# Patient Record
Sex: Female | Born: 1935 | Race: White | Hispanic: No | Smoking: Never smoker
Health system: Southern US, Community
[De-identification: ages and names within clinical notes are randomized; demographics above are authoritative.]

## PROBLEM LIST (undated history)

## (undated) DIAGNOSIS — N950 Postmenopausal bleeding: Secondary | ICD-10-CM

## (undated) DIAGNOSIS — F419 Anxiety disorder, unspecified: Secondary | ICD-10-CM

## (undated) DIAGNOSIS — R0600 Dyspnea, unspecified: Secondary | ICD-10-CM

## (undated) DIAGNOSIS — I4719 Other supraventricular tachycardia: Secondary | ICD-10-CM

## (undated) DIAGNOSIS — C541 Malignant neoplasm of endometrium: Secondary | ICD-10-CM

## (undated) DIAGNOSIS — R110 Nausea: Secondary | ICD-10-CM

## (undated) DIAGNOSIS — I509 Heart failure, unspecified: Secondary | ICD-10-CM

## (undated) DIAGNOSIS — G8929 Other chronic pain: Secondary | ICD-10-CM

## (undated) DIAGNOSIS — R079 Chest pain, unspecified: Secondary | ICD-10-CM

## (undated) DIAGNOSIS — I471 Supraventricular tachycardia: Secondary | ICD-10-CM

## (undated) DIAGNOSIS — K219 Gastro-esophageal reflux disease without esophagitis: Secondary | ICD-10-CM

## (undated) DIAGNOSIS — R200 Anesthesia of skin: Secondary | ICD-10-CM

## (undated) DIAGNOSIS — L97509 Non-pressure chronic ulcer of other part of unspecified foot with unspecified severity: Secondary | ICD-10-CM

## (undated) DIAGNOSIS — I4892 Unspecified atrial flutter: Secondary | ICD-10-CM

## (undated) DIAGNOSIS — K2970 Gastritis, unspecified, without bleeding: Secondary | ICD-10-CM

## (undated) DIAGNOSIS — K851 Biliary acute pancreatitis without necrosis or infection: Secondary | ICD-10-CM

## (undated) DIAGNOSIS — M545 Low back pain, unspecified: Secondary | ICD-10-CM

## (undated) DIAGNOSIS — I4891 Unspecified atrial fibrillation: Secondary | ICD-10-CM

## (undated) DIAGNOSIS — E039 Hypothyroidism, unspecified: Secondary | ICD-10-CM

## (undated) DIAGNOSIS — E785 Hyperlipidemia, unspecified: Secondary | ICD-10-CM

## (undated) DIAGNOSIS — E669 Obesity, unspecified: Secondary | ICD-10-CM

## (undated) DIAGNOSIS — Z8719 Personal history of other diseases of the digestive system: Secondary | ICD-10-CM

## (undated) DIAGNOSIS — Z9289 Personal history of other medical treatment: Secondary | ICD-10-CM

## (undated) DIAGNOSIS — D649 Anemia, unspecified: Secondary | ICD-10-CM

## (undated) DIAGNOSIS — R519 Headache, unspecified: Secondary | ICD-10-CM

## (undated) DIAGNOSIS — I10 Essential (primary) hypertension: Secondary | ICD-10-CM

## (undated) DIAGNOSIS — M1990 Unspecified osteoarthritis, unspecified site: Secondary | ICD-10-CM

## (undated) DIAGNOSIS — R42 Dizziness and giddiness: Secondary | ICD-10-CM

## (undated) DIAGNOSIS — R51 Headache: Secondary | ICD-10-CM

## (undated) HISTORY — PX: LUMBAR SPINE SURGERY: SHX701

## (undated) HISTORY — DX: Essential (primary) hypertension: I10

## (undated) HISTORY — DX: Other supraventricular tachycardia: I47.19

## (undated) HISTORY — PX: SHOULDER SURGERY: SHX246

## (undated) HISTORY — DX: Gastritis, unspecified, without bleeding: K29.70

## (undated) HISTORY — DX: Hypothyroidism, unspecified: E03.9

## (undated) HISTORY — DX: Unspecified atrial fibrillation: I48.91

## (undated) HISTORY — DX: Dizziness and giddiness: R42

## (undated) HISTORY — DX: Supraventricular tachycardia: I47.1

## (undated) HISTORY — DX: Gastro-esophageal reflux disease without esophagitis: K21.9

## (undated) HISTORY — DX: Obesity, unspecified: E66.9

## (undated) HISTORY — PX: BACK SURGERY: SHX140

## (undated) HISTORY — DX: Unspecified atrial flutter: I48.92

## (undated) HISTORY — DX: Hyperlipidemia, unspecified: E78.5

## (undated) HISTORY — PX: FRACTURE SURGERY: SHX138

---

## 1981-06-14 HISTORY — PX: WRIST FRACTURE SURGERY: SHX121

## 1997-10-29 ENCOUNTER — Ambulatory Visit (HOSPITAL_COMMUNITY): Admission: RE | Admit: 1997-10-29 | Discharge: 1997-10-29 | Payer: Self-pay | Admitting: Family Medicine

## 1997-11-15 ENCOUNTER — Inpatient Hospital Stay (HOSPITAL_COMMUNITY): Admission: RE | Admit: 1997-11-15 | Discharge: 1997-11-20 | Payer: Self-pay | Admitting: Neurosurgery

## 1998-04-10 ENCOUNTER — Ambulatory Visit (HOSPITAL_COMMUNITY): Admission: RE | Admit: 1998-04-10 | Discharge: 1998-04-10 | Payer: Self-pay | Admitting: Neurosurgery

## 1998-04-10 ENCOUNTER — Encounter: Payer: Self-pay | Admitting: Neurosurgery

## 2001-09-25 ENCOUNTER — Encounter: Admission: RE | Admit: 2001-09-25 | Discharge: 2001-12-24 | Payer: Self-pay | Admitting: Family Medicine

## 2010-11-16 ENCOUNTER — Inpatient Hospital Stay (HOSPITAL_COMMUNITY)
Admission: EM | Admit: 2010-11-16 | Discharge: 2010-11-18 | DRG: 251 | Disposition: A | Discharge status: Home / Self Care | Payer: Medicare Other | Attending: Interventional Cardiology | Admitting: Interventional Cardiology

## 2010-11-16 ENCOUNTER — Emergency Department (HOSPITAL_COMMUNITY): Payer: Medicare Other

## 2010-11-16 DIAGNOSIS — R55 Syncope and collapse: Secondary | ICD-10-CM | POA: Diagnosis present

## 2010-11-16 DIAGNOSIS — I498 Other specified cardiac arrhythmias: Principal | ICD-10-CM | POA: Diagnosis present

## 2010-11-16 DIAGNOSIS — E039 Hypothyroidism, unspecified: Secondary | ICD-10-CM | POA: Diagnosis present

## 2010-11-16 DIAGNOSIS — I10 Essential (primary) hypertension: Secondary | ICD-10-CM | POA: Diagnosis present

## 2010-11-16 DIAGNOSIS — E785 Hyperlipidemia, unspecified: Secondary | ICD-10-CM | POA: Diagnosis present

## 2010-11-16 LAB — CBC
HCT: 36.9 % (ref 36.0–46.0)
Hemoglobin: 13.5 g/dL (ref 12.0–15.0)
MCH: 31.7 pg (ref 26.0–34.0)
MCHC: 36.6 g/dL — ABNORMAL HIGH (ref 30.0–36.0)
MCV: 86.6 fL (ref 78.0–100.0)
Platelets: 136 10*3/uL — ABNORMAL LOW (ref 150–400)
RBC: 4.26 MIL/uL (ref 3.87–5.11)
RDW: 12.9 % (ref 11.5–15.5)
WBC: 9.4 10*3/uL (ref 4.0–10.5)

## 2010-11-16 LAB — CK TOTAL AND CKMB (NOT AT ARMC)
CK, MB: 1.1 ng/mL (ref 0.3–4.0)
Relative Index: INVALID (ref 0.0–2.5)
Total CK: 59 U/L (ref 7–177)

## 2010-11-16 LAB — DIFFERENTIAL
Basophils Absolute: 0.1 10*3/uL (ref 0.0–0.1)
Basophils Relative: 1 % (ref 0–1)
Eosinophils Absolute: 0.1 10*3/uL (ref 0.0–0.7)
Eosinophils Relative: 1 % (ref 0–5)
Lymphocytes Relative: 24 % (ref 12–46)
Lymphs Abs: 2.3 10*3/uL (ref 0.7–4.0)
Monocytes Absolute: 1 10*3/uL (ref 0.1–1.0)
Monocytes Relative: 11 % (ref 3–12)
Neutro Abs: 5.9 10*3/uL (ref 1.7–7.7)
Neutrophils Relative %: 63 % (ref 43–77)
Smear Review: ADEQUATE

## 2010-11-16 LAB — BASIC METABOLIC PANEL
BUN: 27 mg/dL — ABNORMAL HIGH (ref 6–23)
CO2: 21 mEq/L (ref 19–32)
Chloride: 100 mEq/L (ref 96–112)
Creatinine, Ser: 1.29 mg/dL — ABNORMAL HIGH (ref 0.4–1.2)
Glucose, Bld: 113 mg/dL — ABNORMAL HIGH (ref 70–99)

## 2010-11-16 LAB — TROPONIN I: Troponin I: <0.3 ng/mL (ref <0.30)

## 2010-11-16 LAB — URINALYSIS, ROUTINE W REFLEX MICROSCOPIC
Bilirubin Urine: NEGATIVE
Glucose, UA: NEGATIVE mg/dL
Hgb urine dipstick: NEGATIVE
Ketones, ur: NEGATIVE mg/dL
Protein, ur: NEGATIVE mg/dL

## 2010-11-16 LAB — T4, FREE: Free T4: 1.33 ng/dL (ref 0.80–1.80)

## 2010-11-16 LAB — PROTIME-INR
INR: 1.2 (ref 0.00–1.49)
Prothrombin Time: 15.4 seconds — ABNORMAL HIGH (ref 11.6–15.2)

## 2010-11-16 LAB — TSH: TSH: 1.277 u[IU]/mL (ref 0.350–4.500)

## 2010-11-16 LAB — HEPATIC FUNCTION PANEL
Albumin: 2.9 g/dL — ABNORMAL LOW (ref 3.5–5.2)
Indirect Bilirubin: 0.5 mg/dL (ref 0.3–0.9)
Total Protein: 6.5 g/dL (ref 6.0–8.3)

## 2010-11-16 IMAGING — CR DG CHEST 1V PORT
1 series · 1 of 1 positions shown · non-contrast
Comparison: None.

CLINICAL DATA: Palpitations with high blood pressure.

PORTABLE CHEST - 1 VIEW

[view not recorded]
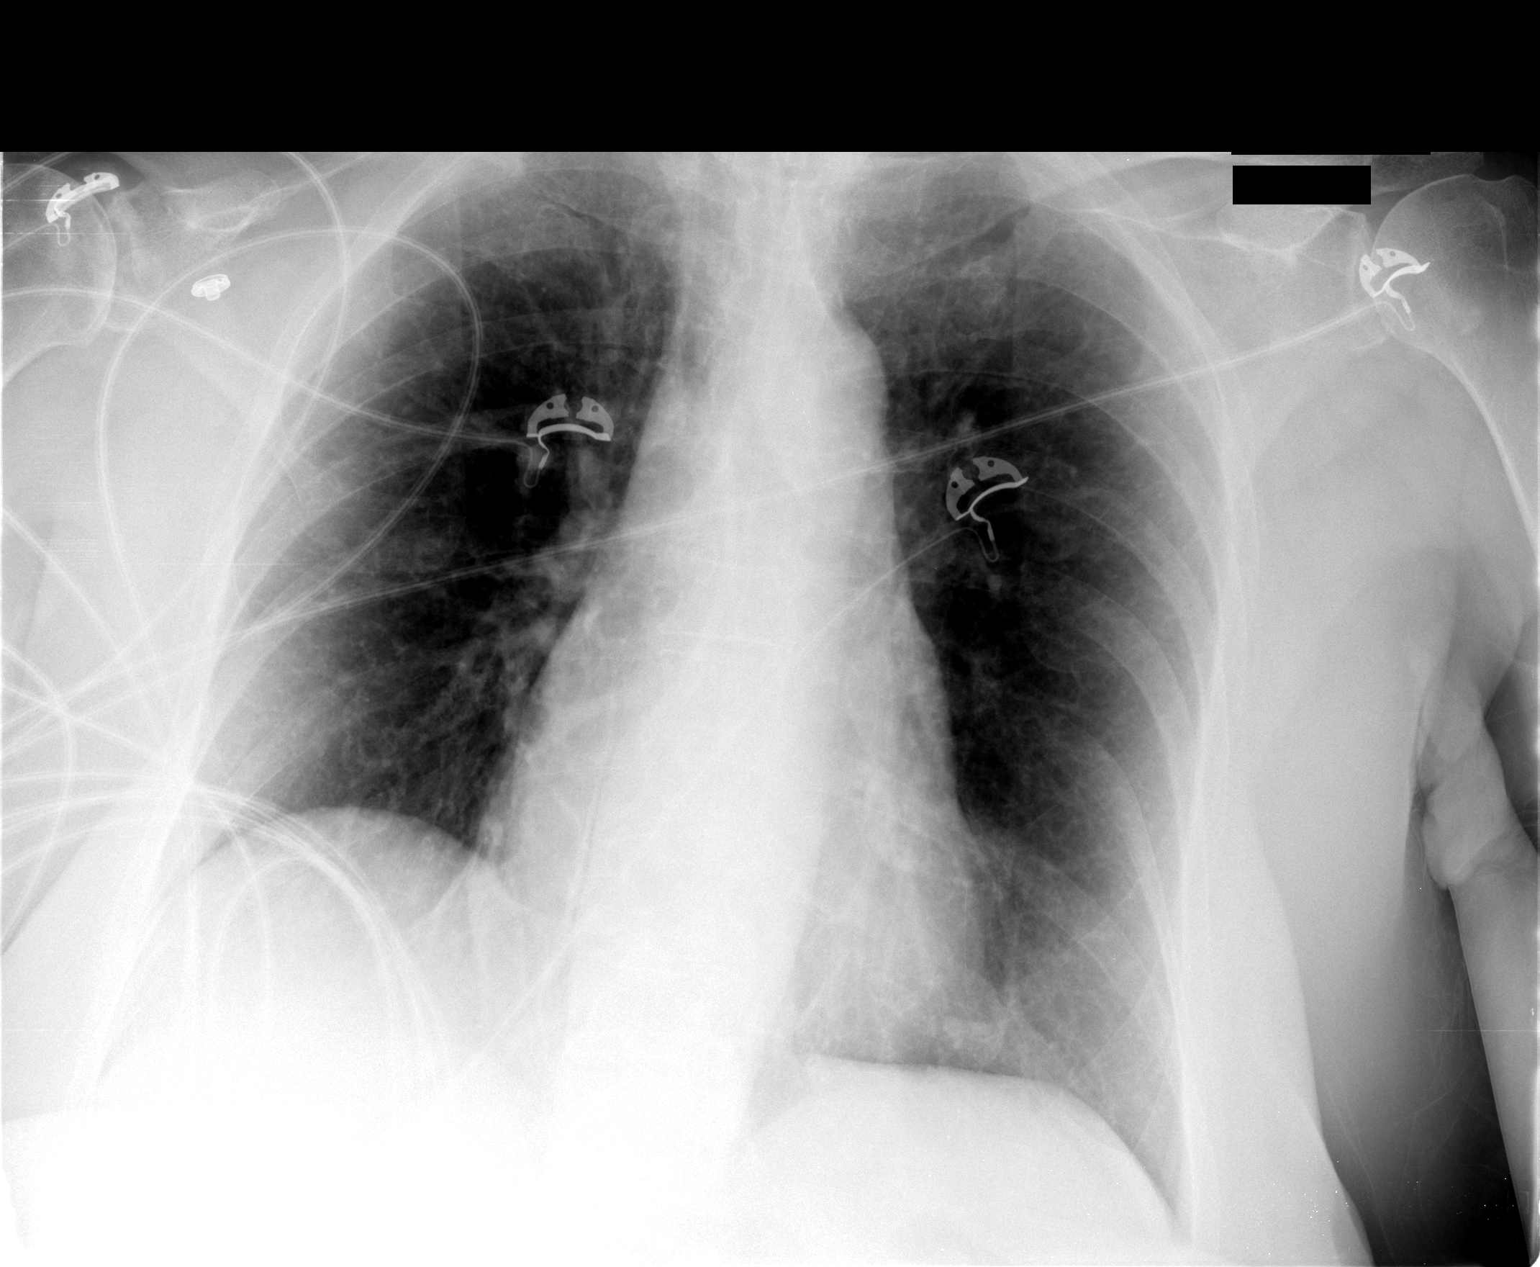

[1 of 1 positions shown; findings below may reference images not displayed]

FINDINGS: [2T] hours. Hyperexpansion is consistent with emphysema.
The lungs are clear without focal infiltrate, edema, pneumothorax
or pleural effusion. The cardiopericardial silhouette is enlarged.
Telemetry leads overlie the chest.
IMPRESSION: Emphysema without acute cardiopulmonary process.

## 2010-11-17 DIAGNOSIS — I471 Supraventricular tachycardia: Secondary | ICD-10-CM

## 2010-11-17 HISTORY — PX: ATRIAL ABLATION SURGERY: SHX560

## 2010-11-17 LAB — CBC
Hemoglobin: 12.3 g/dL (ref 12.0–15.0)
MCHC: 35.7 g/dL (ref 30.0–36.0)
Platelets: 125 10*3/uL — ABNORMAL LOW (ref 150–400)
RBC: 3.95 MIL/uL (ref 3.87–5.11)

## 2010-11-17 LAB — BASIC METABOLIC PANEL
CO2: 22 mEq/L (ref 19–32)
Calcium: 8 mg/dL — ABNORMAL LOW (ref 8.4–10.5)
Chloride: 107 mEq/L (ref 96–112)
GFR calc Af Amer: >60 mL/min (ref >60)
Sodium: 137 mEq/L (ref 135–145)

## 2010-11-17 LAB — URINE CULTURE: Culture  Setup Time: 201206041330

## 2010-11-17 NOTE — H&P (Signed)
Charlotte Castro, TERRIS NO.:  [id number]  MEDICAL RECORD NO.:  [id number]  LOCATION:  4708                         FACILITY:  [facility]  PHYSICIAN:  Jake Bathe, MD      DATE OF BIRTH:  January 20, 1933  DATE OF ADMISSION:  11/16/2010 DATE OF DISCHARGE:                             HISTORY & PHYSICAL   PRIMARY CARE PHYSICIAN:  Cain Saupe, MD  CARDIOLOGIST:  Corky Crafts, MD  CHIEF COMPLAINT:  Dizziness, near syncope, palpitations, feeling weak.  HISTORY OF PRESENT ILLNESS:  A 77 year old female with previous palpitations, who has been on atenolol for the past 4 years, who has also experienced orthostatic hypotension who over the past week has been experiencing increased weakness, fatigue, and near syncope, especially when standing or getting up from a seated position.  Occasionally, she has felt palpitations and her heart racing and while she was at Dr. Recardo Evangelist office today while feeling poorly, he documented a supraventricular tachycardia at 130-140 beats per minute.  She came to the emergency department for further evaluation where she was then found in sinus rhythm with a heart rate of 64 and her resting EKG appeared to have a slight delta wave configuration in lead II and III as well as aVF.  Lab work was done and was unremarkable except for creatinine of 1.29, BUN of 27, and platelet count of 136 and while she was about to be discharged, she ended up once again proceeding to this tachycardia. This was caught on telemetry and appears to be a long RP tachycardia with a P-wave proceeding each QRS complex.  These are abrupt onset and off, do not appeared to be atrial flutter underlying.  I have shown strips to Dr. Ladona Ridgel of Electrophysiology who will perform a consultation.  She denies any recent fevers, emesis, diarrhea.  She has had chills, occasionally over the past week.  Her symptoms of feeling faint have been present for several years and  palpitations also for several years. She saw Dr. Eldridge Dace back in 2009, when he performed a nuclear stress test which was low risk.  EF was normal.  PAST MEDICAL HISTORY:  She denies any coronary artery disease, myocardial infarction, or diabetes.  She does have hypothyroidism, hypertension, hyperlipidemia, obesity, osteoporosis, history of gastritis, and vitamin D deficiency.  FAMILY HISTORY:  Both her mother and father had hypertension, but no early family history of coronary artery disease.  SOCIAL HISTORY:  Denies any tobacco or alcohol use.  She is divorced, she has 2 children, both of which were at her bedside.  She currently lives alone.  She is not allergic to any medicines.  MEDICATION LIST: 1. Centrum. 2. Aspirin 81 mg a day. 3. Detrol 4 mg a day p.r.n. 4. Calcium and vitamin D. 5. Lisinopril 20 mg a day. 6. Fosamax 70 mg once a week. 7. Vitamin D. 8. Pravastatin 20 mg a day. 9. Levothyroxine 75 mcg a day. 10.Atenolol 75 mg a day. 11.Hydrochlorothiazide 25 mg once a day.  Medicine reconciliation, this has been provided by pharmacy.  REVIEW OF SYSTEMS:  Unless explained above, all other 12 review of systems is negative.  She has not had  any frank syncope.  PHYSICAL EXAMINATION:  VITAL SIGNS:  Blood pressure was 100/60, pulse was 130-140, satting 92% on room air, afebrile. GENERAL:  Alert and oriented x3, in no acute distress, elderly-appearing overweight female in bed comfortable with her children at bedside. HEENT:  Eyes, well-perfused conjunctivae.  EOMI.  No scleral icterus. NECK:  Supple.  No lymphadenopathy.  No bruits heard. CARDIOVASCULAR:  Regular rate and rhythm without any appreciable murmurs, rubs, or gallops.  Difficult to palpate PMI. LUNGS:  Clear to auscultation bilaterally.  Normal respiratory effort. ABDOMEN:  Soft, nontender.  Normoactive bowel sounds.  Obese. EXTREMITIES:  No clubbing, cyanosis, or edema.  Palpable distal pulses. SKIN:  Warm,  dry, and intact.  No rashes are noted. GU:  Deferred. RECTAL:  Deferred. NEUROLOGIC:  Cranial nerves II-XII grossly intact.  Nonfocal.  DATA:  Telemetry and EKG as described above, personally reviewed. Portable chest x-ray showed emphysema without any acute cardiopulmonary process - she is a nonsmoker.  This personally reviewed, report as above.  LABS:  As noted above.  White count is normal at 9.4, platelets are slightly low at 136, hemoglobin 13.  Sodium 135, potassium 3.8, BUN 27, creatinine 1.29.  Cardiac markers are negative x1.  ASSESSMENT AND PLAN:  A 77 year old female with paroxysmal supraventricular tachycardia of unknown etiology with orthostatic hypotension and longstanding history of palpitations and near syncope, who over the past week has had escalation or worsening of symptoms.  1. Near syncope - certainly could be exacerbated by autonomic     dysfunction or orthostatic hypotension.  When standing up to go to     the bathroom, she did feel quite dizzy.  We will go ahead and check     orthostatics.  I will hold her hydrochlorothiazide and administer     IV fluids 75 mL per hour.  I will continue with her atenolol 75 mg     once a day as she has been prescribed for quite some time. 2. In regard to her tachycardia, I have had her strips reviewed by Dr.     Lewayne Bunting of Electrophysiology and he will see her in formal     consultation.  Possible dual AV nodal pathophysiology, possible     accessory pathway.  Likely will be on board for EP study tomorrow,     we will make n.p.o. past midnight.  Certainly some of her symptoms     can be related to her tachy arrhythmia, however, she may still have     symptoms with orthostasis. 3. Hypothyroidism - I will check a TSH and free T4.  Continue with     levothyroxine.  I will also check an echocardiogram and cycle     cardiac biomarkers.  Appreciate consultation by Electrophysiology.     We will relay to Dr. Everette Rank.     Jake Bathe, MD     MCS/MEDQ  D:  11/16/2010  T:  11/17/2010  Job:  [id number]  Electronically Signed by Donato Schultz MD on 11/17/2010 06:30:46 AM

## 2010-11-18 LAB — URINALYSIS, MICROSCOPIC ONLY
Bilirubin Urine: NEGATIVE
Glucose, UA: NEGATIVE mg/dL
Specific Gravity, Urine: 1.019 (ref 1.005–1.030)
Urobilinogen, UA: 2 mg/dL — ABNORMAL HIGH (ref 0.0–1.0)
pH: 5.5 (ref 5.0–8.0)

## 2010-11-18 LAB — URINE CULTURE

## 2010-11-23 NOTE — Op Note (Signed)
Charlotte Castro, Charlotte Castro NO.:  [id number]  MEDICAL RECORD NO.:  [id number]  LOCATION:  4708                         FACILITY:  [facility]  PHYSICIAN:  Hillis Range, MD       DATE OF BIRTH:  Oct 19, 1932  DATE OF PROCEDURE:  11/17/2010 DATE OF DISCHARGE:                              OPERATIVE REPORT   SURGEON:  Hillis Range, MD  PREPROCEDURE DIAGNOSIS:  Supraventricular tachycardia.  POSTPROCEDURE DIAGNOSIS:  Atrial tachycardia.  PROCEDURES: 1. Comprehensive electrophysiologic study. 2. Coronary sinus pacing and recording. 3. A 3-D mapping of supraventricular tachycardia. 4. Radiofrequency ablation of supraventricular tachycardia. 5. Arterial blood pressure monitoring.  INTRODUCTION:  Charlotte Castro is a pleasant 77 year old female with a history of tachy palpitations who presents today for EP study and radiofrequency ablation.  She reports abrupt onset and termination of rapid heart beat.  She was recently evaluated and found to have sustained long RP tachycardia.  She therefore presents today for EP study and radiofrequency ablation.  DESCRIPTION OF PROCEDURE:  Informed written consent was obtained and the patient was brought to the electrophysiology lab in the fasting state. She was adequately sedated with intravenous Versed as outlined in the nursing report.  The patient's right neck and groin were prepped and draped in the usual sterile fashion by the EP lab staff.  Using a percutaneous Seldinger technique, one 6-French hemostasis sheath was placed in the right internal jugular vein.  Two 6-French and one 8- [place] hemostasis sheaths were placed in the right common femoral vein. A 6-French curved Damato catheter was introduced through the right internal jugular vein and advanced to the coronary sinus for recording and pacing from this location.  Two 6-French quadripolar Josephson catheters were introduced through the right common femoral vein and advanced into  the His bundle and right ventricular apex positions respectively.  The patient presented to the electrophysiology lab in incessant tachycardia.  This was a one-to-one tachycardia with a VA time measuring 337 milliseconds.  The coronary sinus activation sequence was noted to be proximal to distal in its orientation.  Occasional PVCs were observed, which would produce VAAV response.  The tachycardia would occasionally end and always would end with a ventricular beat.  It was noted that during wobble of the tachycardia that the [facility] would proceed and predict the VV intervals deltas. Ventricular pacing was performed during tachycardia which clearly demonstrated VAAV response.  Attempts to perform other maneuvers was limited due to incessant tachycardia. With any ventricular pacing, the patient would immediately develop incessant tachycardia.  Ventricular pacing could not be performed to further characterize VA conduction due to incessant tachycardia.  I, therefore, elected to perform mapping of this atrial tachycardia.  A Biosense Webster 7-French 4-mm ablation catheter was introduced through the right common femoral vein and advanced into the right atrium.  Three- dimensional electroanatomical mapping was performed using CARTO technology.  The atrial tachycardia was mapped to directly over the His bundle location.  In the earliest area of activation, the distal ablation electrode had a very prominent His signal.  I, therefore, elected to not perform ablation in this location.  An 8-French arterial sheath was placed using a percutaneous  Seldinger technique for arterial blood pressure monitoring.  Heparin was administered for adequate anticoagulation.  The ablation catheter was removed from the right common femoral vein and advanced through the right common femoral artery in a retrograde aortic approach into the noncoronary cusp of the aorta. Additional mapping was performed within the  noncoronary cusp of the aorta and in this location the earliest atrial activation was found. The atrial activation proceeded the surface P-wave by 30 milliseconds in this location.  A single radiofrequency application was delivered at 40 watts for a target temperature of 60 degrees for 25 seconds.  Among initiation of radiofrequency current, the tachycardia immediately terminated.  Following a 30-second lesion, the patient was observed. The tachycardia returned and an additional radiofrequency application was delivered in the same location as a bonus burn.  Ventricular pacing was then performed which revealed concentric decremental VA conduction with a VA Wenckebach cycle length of 390 milliseconds with no sustained tachycardias observed.  Ventricular extrastimulus testing was performed which revealed decremental VA conduction with no retrograde jumps, echo beats, or tachycardias observed.  The retrograde AV nodal ERP was 500/350 milliseconds.  Additional ventricular pacing was performed and the patient was observed to have nonsustained return of the atrial tachycardia.  I, therefore, elected to perform 2 additional radiofrequency applications over the noncoronary cusp of the aorta at the site of earliest activation at 40 watts for 60 degrees.  Lesions were 30 seconds and 60 seconds in length respectively.  Following ablation, ventricular pacing was again performed which revealed decremental VA conduction with no tachycardias induced.  The retrograde Wenckebach cycle length was 390 milliseconds.  Ventricular extrastimulus testing was performed which revealed decremental VA conduction with no retrograde jumps, echo beats, or tachycardias.  The ERP of the AV node was 500/350 milliseconds.  Atrial pacing was performed which revealed no evidence of PR greater than RR and no tachycardias induced.  The AV Wenckebach cycle length was 370 milliseconds.  Atrial extrastimulus testing was performed  which revealed decremental AV conduction with a single AH jump, an echo beat, but no arrhythmias observed.  As the patient had not had clinical AV nodal reentrant tachycardia previously as well as the fact that I had ablated near the fast pathway today, I felt that it was most prudent to not perform slow pathway ablation today.  Multiple attempts, however, were made to induce AV nodal reentrant tachycardia and these were unsuccessful.  The patient had no further episodes of the initial tachycardia, which was an atrial tachycardia with a cycle length of 480 milliseconds.  Following the ablation, the AH interval measured 36 milliseconds with an HV interval of 38 milliseconds.  The procedure was, therefore, considered completed. All catheters were removed and the sheaths were aspirated and flushed. The sheaths were removed and hemostasis was assured.  There were no early apparent complications.  CONCLUSIONS: 1. Incessant atrial tachycardia arising from the region of the His     bundle upon presentation, successfully ablated from the noncoronary     cusp of the aorta. 2. Dual atrioventricular nodal physiology was present but     atrioventricular nodal reentrant tachycardia was not the clinical     arrhythmia and could not be induced today.  I, therefore, elected     to not perform slow pathway ablation today. 3. No inducible arrhythmias following ablation. 4. No early apparent complications.    Hillis Range, MD    JA/MEDQ  D:  11/17/2010  T:  11/18/2010  Job:  [id number]  cc:   Corky Crafts, MD  Electronically Signed by Hillis Range MD on 11/23/2010 09:20:31 AM

## 2010-11-24 LAB — CULTURE, BLOOD (ROUTINE X 2)
Culture  Setup Time: 201206060500
Culture: NO GROWTH
Culture: NO GROWTH

## 2010-12-02 NOTE — Discharge Summary (Signed)
  NAMESHERELL, Charlotte Castro                ACCOUNT NO.:  [id number]  MEDICAL RECORD NO.:  [id number]  LOCATION:  4708                         FACILITY:  [facility]  PHYSICIAN:  Corky Crafts, MDDATE OF BIRTH:  Nov 02, 1932  DATE OF ADMISSION:  11/16/2010 DATE OF DISCHARGE:  11/18/2010                              DISCHARGE SUMMARY   FINAL DIAGNOSES: 1. Atrial tachycardia. 2. Near syncope. 3. Hypertension. 4. Orthostasis.  PROCEDURE PERFORMED:  Ablation of the atrial tachycardia on November 17, 2010, by Dr. Hillis Range.  HOSPITAL COURSE:  The patient was admitted for presyncopal symptoms. She was seen in her doctor's office and found to have a sustained heart rate of 130 beats per minute.  She had this heart rate intermittently while in the hospital.  She was also noted to have symptoms of orthostatic hypotension.  It was thought that her tachycardia could not be controlled by medications because of her orthostasis.  Therefore, she was taken for ablation, which was successful.  After the ablation, she had no further arrhythmia on telemetry.  Her atenolol and her diuretic were stopped to hopefully help improve the symptoms of orthostasis.  Her blood pressures were controlled and she was feeling well.  She did have a fever the night before leaving the hospital.  She had a UA which was essentially negative couple of days earlier.  She had had a urine culture, which was negative.  She had no dysuria or productive cough. Her fever resolved and did not return.  She felt well and wanted to go home and was deemed ready for discharge on November 18, 2010.  DISCHARGE MEDICATIONS: 1. Aspirin 81 mg daily. 2. Lisinopril 20 mg daily. 3. Pravachol 20 mg daily. 4. Levothyroxine 75 mcg daily. 5. Vitamin D3. 6. Multivitamin. 7. Tylenol p.r.n. 8. Chlorocidin p.r.n. 9. Fosamax 70 mg once a week.  She is to stop taking atenolol and     hydrochlorothiazide.  INSTRUCTIONS:  No lifting more than 10 pounds for  about a week given the recent catheterization and EP study.  Increase activity slowly.  DIET:  Low-sodium, heart-healthy diet.  Followup appointments with Dr. Johney Frame in 4 weeks and with Dr. Hoyle Barr office in 2 weeks.     Corky Crafts, MD     JSV/MEDQ  D:  11/18/2010  T:  11/19/2010  Job:  [id number]  Electronically Signed by Lance Muss MD on 12/02/2010 12:12:41 PM

## 2010-12-22 ENCOUNTER — Encounter: Payer: Self-pay | Admitting: Internal Medicine

## 2010-12-22 ENCOUNTER — Encounter: Payer: Self-pay | Admitting: *Deleted

## 2010-12-23 ENCOUNTER — Ambulatory Visit (INDEPENDENT_AMBULATORY_CARE_PROVIDER_SITE_OTHER): Payer: Medicare Other | Admitting: Internal Medicine

## 2010-12-23 ENCOUNTER — Encounter: Payer: Self-pay | Admitting: Internal Medicine

## 2010-12-23 VITALS — BP 129/79 | HR 116 | Ht 68.0 in | Wt 229.0 lb

## 2010-12-23 DIAGNOSIS — I4892 Unspecified atrial flutter: Secondary | ICD-10-CM

## 2010-12-23 DIAGNOSIS — R42 Dizziness and giddiness: Secondary | ICD-10-CM

## 2010-12-23 NOTE — Patient Instructions (Signed)
Your physician recommends that you schedule a follow-up appointment in: 4 weeks with Dr Allred.  

## 2010-12-24 ENCOUNTER — Encounter: Payer: Self-pay | Admitting: Internal Medicine

## 2010-12-24 DIAGNOSIS — R42 Dizziness and giddiness: Secondary | ICD-10-CM | POA: Insufficient documentation

## 2010-12-24 NOTE — Progress Notes (Signed)
The patient presents today for routine electrophysiology followup.  Since having her atrial tachycardia ablation, the patient reports doing reasonably well. She presented to [facility] 6/12 with incessant atrial tachycardia.  She underwent catheter ablation by me with successful termination of the incessant tachycardia which was ablated from within the noncoronary cusp of the aorta.  She denies procedure related complications. Unfortunately, she has been subsequently found to have atrial fibrillation by Dr Eldridge Dace.  Today, her EKG reveals typical appearing atrial flutter. She reports occasional palpitations.  She also reports dizziness and fatigue but feels that these have been present for "three years".   Today, she denies symptoms of chest pain, shortness of breath, orthopnea, PND, lower extremity edema, presyncope, syncope, or neurologic sequela.  The patient feels that she is tolerating medications without difficulties and is otherwise without complaint today.   Past Medical History  Diagnosis Date  . Dizziness     chronic and of an unclear etiology  . Hypothyroidism   . HTN (hypertension)   . Hyperlipemia   . Obesity   . Osteoporosis   . Gastritis   . Vitamin D deficiency   . Atrial tachycardia     ablated 11/17/10  by JA  from the [facility] of the aorta  . Atrial flutter     typical appearing  . Atrial fibrillation    Past Surgical History  Procedure Date  . Atrial ablation surgery 11/17/10    Atrial tachycardia arising from [facility] of the aorta ablated by JA    Current Outpatient Prescriptions  Medication Sig Dispense Refill  . acetaminophen (TYLENOL) 325 MG tablet Take 650 mg by mouth every 4 (four) hours as needed.        Marland Kitchen alendronate (FOSAMAX) 70 MG tablet Take 70 mg by mouth every 7 (seven) days. Take with a full glass of water on an empty stomach.       Marland Kitchen aspirin 81 MG tablet Take 81 mg by mouth daily.        Marland Kitchen atenolol (TENORMIN) 50 MG tablet Take 50 mg by mouth daily.        .  Calcium Carbonate-Vitamin D (CALCIUM-VITAMIN D) 600-200 MG-UNIT CAPS Take by mouth daily.        . Chlorphen-Pseudoephed-APAP (CORICIDIN D PO) Take by mouth as needed.        . Cholecalciferol (VITAMIN D) 1000 UNITS capsule Take 2,000 Units by mouth daily.        . hydrochlorothiazide 25 MG tablet Take 25 mg by mouth daily.        Marland Kitchen levothyroxine (SYNTHROID, LEVOTHROID) 75 MCG tablet Take 75 mcg by mouth daily.        . multivitamin-iron-minerals-folic acid (CENTRUM) chewable tablet Chew 1 tablet by mouth daily.        . pravastatin (PRAVACHOL) 20 MG tablet Take 20 mg by mouth daily.        Marland Kitchen tolterodine (DETROL LA) 4 MG 24 hr capsule Take 4 mg by mouth daily.        Marland Kitchen warfarin (COUMADIN) 5 MG tablet As directed by coumadin clinic         No Known Allergies  History   Social History  . Marital Status: Single    Spouse Name: N/A    Number of Children: N/A  . Years of Education: N/A   Occupational History  . Not on file.   Social History Main Topics  . Smoking status: Never Smoker   . Smokeless tobacco: Not on  file  . Alcohol Use: No  . Drug Use: No  . Sexually Active: Not on file   Other Topics Concern  . Not on file   Social History Narrative  . No narrative on file    Family History  Problem Relation Age of Onset  . Hypertension      ROS-  All systems are reviewed and are negative except as outlined in the HPI above  Physical Exam: Filed Vitals:   12/23/10 1413  BP: 129/79  Pulse: 116  Height: 5\' 8"  (1.727 m)  Weight: 229 lb (103.874 kg)    GEN- The patient is anxious appearing, alert and oriented x 3 today.   Head- normocephalic, atraumatic Eyes-  Sclera clear, conjunctiva pink Ears- hearing intact Oropharynx- clear Neck- supple, no JVP Lymph- no cervical lymphadenopathy Lungs- Clear to ausculation bilaterally, normal work of breathing Heart- irrgular rate and rhythm, no murmurs, rubs or gallops, PMI not laterally displaced GI- soft, NT, ND, +  BS Extremities- no clubbing, cyanosis, or edema MS- no significant deformity or atrophy Skin- no rash or lesion Psych- anxious mood, full affect Neuro- strength and sensation are intact  ekg today reveals typical appearing atrial flutter, V rate 106 bpm  Assessment and Plan:

## 2010-12-24 NOTE — Assessment & Plan Note (Signed)
Charlotte Castro presents for follow-up after her recent atach ablation.  Though she has done well, without further Atach, she has been discovered by Dr Eldridge Dace to have afib.  Today, she presents with typical appearing atrial flutter.  She has been appropriately initiated on coumadin.   At this point, I think that she would benefit from an antiarrhythmic medicine for rhythm control.  I would recommend rhythmol SR 225mg  BID. I have therefore instructed her to follow-up with Dr Eldridge Dace for a [name].  If her [name] is normal, then she should start rhythmol (once her INRs have been therapeutic for 3 weeks).   I will see her again in 4-6 weeks for follow-up on rhythmol. I have spoken with Dr Eldridge Dace who will assist with this plan.

## 2011-01-11 ENCOUNTER — Ambulatory Visit (HOSPITAL_COMMUNITY)
Admission: RE | Admit: 2011-01-11 | Discharge: 2011-01-11 | Disposition: A | Discharge status: Home / Self Care | Payer: Medicare Other | Source: Ambulatory Visit | Attending: Interventional Cardiology | Admitting: Interventional Cardiology

## 2011-01-11 DIAGNOSIS — R9439 Abnormal result of other cardiovascular function study: Secondary | ICD-10-CM | POA: Insufficient documentation

## 2011-01-11 DIAGNOSIS — I4891 Unspecified atrial fibrillation: Secondary | ICD-10-CM | POA: Insufficient documentation

## 2011-01-11 DIAGNOSIS — R0602 Shortness of breath: Secondary | ICD-10-CM | POA: Insufficient documentation

## 2011-01-11 DIAGNOSIS — I10 Essential (primary) hypertension: Secondary | ICD-10-CM | POA: Insufficient documentation

## 2011-01-11 HISTORY — PX: CARDIAC CATHETERIZATION: SHX172

## 2011-01-11 LAB — PROTIME-INR
INR: 1.24 (ref 0.00–1.49)
Prothrombin Time: 15.9 seconds — ABNORMAL HIGH (ref 11.6–15.2)

## 2011-01-11 LAB — POCT ACTIVATED CLOTTING TIME
Activated Clotting Time: 210 seconds
Activated Clotting Time: 221 seconds

## 2011-01-13 NOTE — Cardiovascular Report (Signed)
  NAMESTACEY, Charlotte Castro                ACCOUNT NO.:  [id number]  MEDICAL RECORD NO.:  [id number]  LOCATION:  [facility]                         FACILITY:  [facility]  PHYSICIAN:  Corky Crafts, MDDATE OF BIRTH:  1932-12-27  DATE OF PROCEDURE:  01/11/2011 DATE OF DISCHARGE:  01/11/2011                           CARDIAC CATHETERIZATION   PROCEDURES PERFORMED: 1. Left heart catheterization. 2. Left ventriculogram. 3. Coronary angiogram.  OPERATOR:  Corky Crafts, MD  INDICATIONS:  Abnormal stress test.  PROCEDURE NARRATIVE:  The risks and benefits of cardiac catheterization were explained to the patient.  Informed consent was obtained.  She was brought to the cath lab.  She was prepped and draped in usual sterile fashion.  Her right groin was infiltrated with 1% lidocaine.  A 5-French sheath was placed into the right common femoral artery using modified Seldinger technique.  Left coronary artery angiography was performed using a JL-4.0 pigtail catheter.  Catheter was advanced to the vessel ostium under fluoroscopic guidance.  Digital angiography was performed in multiple projections using hand injection of contrast.  Right coronary artery angiography was performed in a similar fashion.  Pigtail catheter was advanced to the ascending aorta and across the aortic valve under fluoroscopic guidance.  Power injection of contrast was performed in the RAO projection to image the left ventricle.  Catheter was pulled back under continuous hemodynamic pressure monitoring.  We subsequently upsized to 6-French catheter and imaged with a guide catheter.  Used the wire with over-the-wire balloon.  Heparin was used for anticoagulation. ACT was used to confirm.  FINDINGS: 1. The left main is widely patent. 2. The left circumflex is a large vessel. 3. The OM-1 was large and appeared angiographically normal.  Left     anterior descending is a large vessel proximally.  After several     septals,  the mid-to-distal LAD was very small. 4. The ramus was a large patent vessel. 5. The right coronary artery is a large dominant vessel with a high     bifurcation.  The PLA was small.  The PDA was medium-sized and     widely patent.  Of note, it is unclear where the mid LAD was or     whether it was occluded.  There are also what appeared to be right-     to-left collaterals versus venous filling.  We used CLS 3.5 guiding     catheter to obtain better pictures.  Further imaging showed that     the mid-to-distal LAD was very small and may be affected by spasm.  IMPRESSION: 1. Very small mid-to-distal LAD, question of vasospasm. 2. Normal ventricular function. 3. Left ventricular end-diastolic pressure of 22 mmHg. 4. The patient is currently in normal sinus rhythm.  RECOMMENDATIONS:  Continue aspirin, resume Coumadin for atrial fibrillation.  We will also treat for vasospasm, start Imdur.  LVEDP mildly increased. We will consider diuresis as well to help with shortness of breath.     Corky Crafts, MDJSV/MEDQ  D:  01/11/2011  T:  01/12/2011  Job:  [id number]  Electronically Signed by Lance Muss MD on 01/13/2011 01:26:12 PM

## 2011-01-27 ENCOUNTER — Encounter: Payer: Self-pay | Admitting: Internal Medicine

## 2011-01-27 ENCOUNTER — Ambulatory Visit (INDEPENDENT_AMBULATORY_CARE_PROVIDER_SITE_OTHER): Payer: Medicare Other | Admitting: Internal Medicine

## 2011-01-27 VITALS — BP 142/80 | HR 65 | Ht 68.0 in | Wt 229.0 lb

## 2011-01-27 DIAGNOSIS — I4892 Unspecified atrial flutter: Secondary | ICD-10-CM

## 2011-01-27 NOTE — Patient Instructions (Signed)
Your physician recommends that you schedule a follow-up appointment as needed  

## 2011-01-27 NOTE — Progress Notes (Signed)
The patient presents today for routine electrophysiology followup.  Since last being seen in our clinic, the patient reports doing reasonably well.  She underwent stress testing by Dr Eldridge Dace.  This was abnormal and therefore she underwent cath.  Per Dr Eldridge Dace, she did not have significant CAD, though the mid to distal LAD was quite small raising the concern for vasospasm.  She was placed on imdur but did not tolerate this medicine due to headaches.  She has not initiated an AAD, but has returned to sinus rhythm.  She denies any recent symptoms of arrhythmia and is otherwise doing quite well at this time.  She continues to report dizziness and fatigue but feels that these have been present for "three years".   Today, she denies symptoms of chest pain, shortness of breath, orthopnea, PND, lower extremity edema, presyncope, syncope, or neurologic sequela.  The patient feels that she is tolerating medications without difficulties and is otherwise without complaint today.   Past Medical History  Diagnosis Date  . Dizziness     chronic and of an unclear etiology  . Hypothyroidism   . HTN (hypertension)   . Hyperlipemia   . Obesity   . Osteoporosis   . Gastritis   . Vitamin D deficiency   . Atrial tachycardia     ablated 11/17/10  by JA  from the [facility] of the aorta  . Atrial flutter     typical appearing  . Atrial fibrillation    Past Surgical History  Procedure Date  . Atrial ablation surgery 11/17/10    Atrial tachycardia arising from [facility] of the aorta ablated by JA    Current Outpatient Prescriptions  Medication Sig Dispense Refill  . acetaminophen (TYLENOL) 325 MG tablet Take 650 mg by mouth every 4 (four) hours as needed.        Marland Kitchen alendronate (FOSAMAX) 70 MG tablet Take 70 mg by mouth every 7 (seven) days. Take with a full glass of water on an empty stomach.       Marland Kitchen aspirin 81 MG tablet Take 81 mg by mouth daily.        Marland Kitchen atenolol (TENORMIN) 50 MG tablet Take 50 mg by mouth daily.          . Calcium Carbonate-Vitamin D (CALCIUM-VITAMIN D) 600-200 MG-UNIT CAPS Take by mouth daily.        . Chlorphen-Pseudoephed-APAP (CORICIDIN D PO) Take by mouth as needed.        . Cholecalciferol (VITAMIN D) 1000 UNITS capsule Take 2,000 Units by mouth daily.        . hydrochlorothiazide 25 MG tablet Take 25 mg by mouth daily.        Marland Kitchen levothyroxine (SYNTHROID, LEVOTHROID) 75 MCG tablet Take 75 mcg by mouth daily.        . multivitamin-iron-minerals-folic acid (CENTRUM) chewable tablet Chew 1 tablet by mouth daily.        . pravastatin (PRAVACHOL) 20 MG tablet Take 20 mg by mouth daily.        Marland Kitchen tolterodine (DETROL LA) 4 MG 24 hr capsule Take 4 mg by mouth daily.        Marland Kitchen warfarin (COUMADIN) 5 MG tablet As directed by coumadin clinic         Allergies  Allergen Reactions  . Iodinated Diagnostic Agents     History   Social History  . Marital Status: Single    Spouse Name: N/A    Number of Children: N/A  . Years  of Education: N/A   Occupational History  . Not on file.   Social History Main Topics  . Smoking status: Never Smoker   . Smokeless tobacco: Not on file  . Alcohol Use: No  . Drug Use: No  . Sexually Active: Not on file   Other Topics Concern  . Not on file   Social History Narrative  . No narrative on file    Family History  Problem Relation Age of Onset  . Hypertension      ROS-  All systems are reviewed and are negative except as outlined in the HPI above  Physical Exam: Filed Vitals:   01/27/11 1141  BP: 142/80  Pulse: 65  Height: 5\' 8"  (1.727 m)  Weight: 229 lb (103.874 kg)    GEN- The patient is anxious appearing, alert and oriented x 3 today.   Head- normocephalic, atraumatic Eyes-  Sclera clear, conjunctiva pink Ears- hearing intact Oropharynx- clear Neck- supple, no JVP Lymph- no cervical lymphadenopathy Lungs- Clear to ausculation bilaterally, normal work of breathing Heart- irrgular rate and rhythm, no murmurs, rubs or gallops, PMI  not laterally displaced GI- soft, NT, ND, + BS Extremities- no clubbing, cyanosis, or edema MS- no significant deformity or atrophy Skin- no rash or lesion Psych- anxious mood, full affect Neuro- strength and sensation are intact  ekg today reveals sinus rhythm 65 bpm, otherwise normal ekg  Assessment and Plan:

## 2011-01-27 NOTE — Assessment & Plan Note (Signed)
Charlotte Castro presents today for follow-up. She is s/p prior atrial tachycardia ablation.  She has been documented to have both typical appearing atrial flutter as well as atrial fibrillation.  Presently, she has returned to sinus rhythm and has not yet initiated an AAD.  She is appropriately anticoagulated with coumadin.  At this time, we will continue watchful waiting.  If her afib burden increases, then I would recommend rhythmol as an option. If she has predominantly atrial flutter in the future, we could consider another ablation.  I have made no changes today.  She will follow closely with Dr Eldridge Dace and I will see her as needed. I would be happy to assist in her care in the future as needed.

## 2011-06-10 ENCOUNTER — Ambulatory Visit (INDEPENDENT_AMBULATORY_CARE_PROVIDER_SITE_OTHER): Payer: Medicare Other | Admitting: Internal Medicine

## 2011-06-10 ENCOUNTER — Encounter: Payer: Self-pay | Admitting: Internal Medicine

## 2011-06-10 VITALS — BP 148/96 | HR 77 | Ht 68.0 in | Wt 237.0 lb

## 2011-06-10 DIAGNOSIS — I4821 Permanent atrial fibrillation: Secondary | ICD-10-CM | POA: Insufficient documentation

## 2011-06-10 DIAGNOSIS — I4891 Unspecified atrial fibrillation: Secondary | ICD-10-CM

## 2011-06-10 MED ORDER — AMIODARONE HCL 200 MG PO TABS: 200.0000 mg | ORAL_TABLET | Freq: Two times a day (BID) | ORAL | Status: DC

## 2011-06-10 NOTE — Assessment & Plan Note (Signed)
The patient has had multiple atrial arrhythmias including atrial flutter/ atrial tachycardia/ and most recently afib.  She is moderately symptomatic.  She has not previously tolerated rhythmol.  I have avoided flecainide due to chronic dizziness. Therapeutic strategies for her atrial arrhythmias including medicine and ablation were discussed in detail with the patient today. She is clear that she wishes to avoid ablation presently. I offered admission for initiation of tikosyn vs amiodarone.  Risks, benefits, alternatives to amiodarone were also discussed at length.  At this time, she would like to start amiodarone. Her INRs have been therapeutic. I will therefore start amiodarone 200mg  BID at this time. She will return to see Dr Eldridge Dace in 4 weeks.  IF she remains in afib, she may require cardioversion.  I will see her again in 6 weeks.

## 2011-06-10 NOTE — Progress Notes (Signed)
The patient presents today for routine electrophysiology followup.  Since last being seen in our clinic, the patient reports doing reasonably well.  She has occasional afib/ atrial tachycardia/ and atrial flutter.  She reports symptoms of nervousness and tachypalpitations.  She did not tolerate rhythmol due to "metalic taste".  She continued to have atrial arrhythmias on rhymol despite 225mg  TID dosing.  She continues to report dizziness and fatigue but feels that these have been present for "three years".   Today, she denies symptoms of chest pain, shortness of breath, orthopnea, PND, lower extremity edema, presyncope, syncope, or neurologic sequela.  The patient feels that she is tolerating medications without difficulties and is otherwise without complaint today.   Past Medical History  Diagnosis Date  . Dizziness     chronic and of an unclear etiology  . Hypothyroidism   . HTN (hypertension)   . Hyperlipemia   . Obesity   . Osteoporosis   . Gastritis   . Vitamin D deficiency   . Atrial tachycardia     ablated 11/17/10  by JA  from the [facility] of the aorta  . Atrial flutter     typical appearing  . Atrial fibrillation    Past Surgical History  Procedure Date  . Atrial ablation surgery 11/17/10    Atrial tachycardia arising from [facility] of the aorta ablated by JA    Current Outpatient Prescriptions  Medication Sig Dispense Refill  . acetaminophen (TYLENOL) 325 MG tablet Take 650 mg by mouth every 4 (four) hours as needed.        Marland Kitchen alendronate (FOSAMAX) 70 MG tablet Take 70 mg by mouth every 7 (seven) days. Take with a full glass of water on an empty stomach.       Marland Kitchen atenolol (TENORMIN) 50 MG tablet Take 50 mg by mouth daily.        . Calcium Carbonate-Vitamin D (CALCIUM-VITAMIN D) 600-200 MG-UNIT CAPS Take by mouth daily.        . Cholecalciferol (VITAMIN D) 1000 UNITS capsule Take 2,000 Units by mouth daily.        Marland Kitchen diltiazem (CARDIZEM) 120 MG tablet Take 120 mg by mouth daily.        .  hydrochlorothiazide 25 MG tablet Take 12.5 mg by mouth daily.       Marland Kitchen levothyroxine (SYNTHROID, LEVOTHROID) 75 MCG tablet Take 75 mcg by mouth daily.        . multivitamin-iron-minerals-folic acid (CENTRUM) chewable tablet Chew 1 tablet by mouth daily.        . pravastatin (PRAVACHOL) 20 MG tablet Take 20 mg by mouth daily.        Marland Kitchen tolterodine (DETROL LA) 4 MG 24 hr capsule Take 4 mg by mouth daily.        Marland Kitchen warfarin (COUMADIN) 5 MG tablet As directed by coumadin clinic       . amiodarone (PACERONE) 200 MG tablet Take 1 tablet (200 mg total) by mouth 2 (two) times daily.  60 tablet  11    Allergies  Allergen Reactions  . Iodinated Diagnostic Agents     History   Social History  . Marital Status: Single    Spouse Name: N/A    Number of Children: N/A  . Years of Education: N/A   Occupational History  . Not on file.   Social History Main Topics  . Smoking status: Never Smoker   . Smokeless tobacco: Not on file  . Alcohol Use: No  .  Drug Use: No  . Sexually Active: Not on file   Other Topics Concern  . Not on file   Social History Narrative  . No narrative on file    Family History  Problem Relation Age of Onset  . Hypertension      ROS-  All systems are reviewed and are negative except as outlined in the HPI above  Physical Exam: Filed Vitals:   06/10/11 1344  BP: 148/96  Pulse: 77  Height: 5\' 8"  (1.727 m)  Weight: 237 lb (107.502 kg)    GEN- The patient is anxious appearing, alert and oriented x 3 today.   Head- normocephalic, atraumatic Eyes-  Sclera clear, conjunctiva pink Ears- hearing intact Oropharynx- clear Neck- supple, no JVP Lymph- no cervical lymphadenopathy Lungs- Clear to ausculation bilaterally, normal work of breathing Heart- irrgular rate and rhythm, no murmurs, rubs or gallops, PMI not laterally displaced GI- soft, NT, ND, + BS Extremities- no clubbing, cyanosis, or edema MS- no significant deformity or atrophy Skin- no rash or  lesion Psych- anxious mood, full affect Neuro- strength and sensation are intact  ekg today reveals afib V rate 73 bpm, nonspecific ST/T changes  Assessment and Plan:

## 2011-06-10 NOTE — Patient Instructions (Signed)
Your physician has recommended you make the following change in your medication: Start Amiodarone 200mg  1 tablet twice daily.  Your physician recommends that you schedule a follow-up appointment in: 4 weeks with Dr Johney Frame.

## 2011-07-26 ENCOUNTER — Encounter: Payer: Self-pay | Admitting: Internal Medicine

## 2011-07-26 ENCOUNTER — Ambulatory Visit (INDEPENDENT_AMBULATORY_CARE_PROVIDER_SITE_OTHER): Payer: Medicare Other | Admitting: Internal Medicine

## 2011-07-26 VITALS — BP 132/78 | HR 80 | Ht 68.0 in | Wt 235.1 lb

## 2011-07-26 DIAGNOSIS — I4891 Unspecified atrial fibrillation: Secondary | ICD-10-CM

## 2011-07-26 LAB — HEPATIC FUNCTION PANEL
AST: 26 U/L (ref 0–37)
Total Bilirubin: 0.5 mg/dL (ref 0.3–1.2)

## 2011-07-26 LAB — TSH: TSH: 4.1 u[IU]/mL (ref 0.35–5.50)

## 2011-07-26 LAB — T4, FREE: Free T4: 1.19 ng/dL (ref 0.60–1.60)

## 2011-07-26 NOTE — Patient Instructions (Signed)
Your physician recommends that you schedule a follow-up appointment as needed for Dr Johney Frame  Your physician recommends that you return for lab work today

## 2011-07-26 NOTE — Assessment & Plan Note (Signed)
The patient has had multiple atrial arrhythmias including atrial flutter/ atrial tachycardia/ and most recently afib. She has not previously tolerated rhythmol.She is now back in sinus and doing well with amiodarone 200mg  daily. We will check TFTs and LFTs today.  She will have these followed regularly by Dr Eldridge Dace. If her arrhythmias remain quiescent after 6 months, I would recommend decreasing amiodarone to 100mg  daily.  She will continue to follow with Dr Eldridge Dace and I will see her as needed going forward.  Goal INR 2-3.

## 2011-07-26 NOTE — Progress Notes (Signed)
The patient presents today for routine electrophysiology followup.  Since last being seen in our clinic, the patient reports doing reasonably well.  She reports that her tachypalpitations have resolved with amiodarone.  Today, she denies symptoms of chest pain, shortness of breath, orthopnea, PND, lower extremity edema, presyncope, syncope, or neurologic sequela.  The patient feels that she is tolerating medications without difficulties and is otherwise without complaint today.   Past Medical History  Diagnosis Date  . Dizziness     chronic and of an unclear etiology  . Hypothyroidism   . HTN (hypertension)   . Hyperlipemia   . Obesity   . Osteoporosis   . Gastritis   . Vitamin d deficiency   . Atrial tachycardia     ablated 11/17/10  by JA  from the [facility] of the aorta  . Atrial flutter     typical appearing  . Atrial fibrillation    Past Surgical History  Procedure Date  . Atrial ablation surgery 11/17/10    Atrial tachycardia arising from [facility] of the aorta ablated by JA    Current Outpatient Prescriptions  Medication Sig Dispense Refill  . acetaminophen (TYLENOL) 325 MG tablet Take 650 mg by mouth every 4 (four) hours as needed.        Marland Kitchen alendronate (FOSAMAX) 70 MG tablet Take 70 mg by mouth every 7 (seven) days. Take with a full glass of water on an empty stomach.       Marland Kitchen amiodarone (PACERONE) 200 MG tablet Take 1 tablet (200 mg total) by mouth 2 (two) times daily.  60 tablet  11  . atenolol (TENORMIN) 50 MG tablet Take 50 mg by mouth daily.        . Calcium Carbonate-Vitamin D (CALCIUM-VITAMIN D) 600-200 MG-UNIT CAPS Take by mouth daily.        . Cholecalciferol (VITAMIN D) 1000 UNITS capsule Take 2,000 Units by mouth daily.        Marland Kitchen diltiazem (CARDIZEM) 120 MG tablet Take 120 mg by mouth daily.        . hydrochlorothiazide 25 MG tablet Take 12.5 mg by mouth daily.       Marland Kitchen levothyroxine (SYNTHROID, LEVOTHROID) 75 MCG tablet Take 75 mcg by mouth daily.        .  multivitamin-iron-minerals-folic acid (CENTRUM) chewable tablet Chew 1 tablet by mouth daily.        . pravastatin (PRAVACHOL) 20 MG tablet Take 20 mg by mouth daily.        Marland Kitchen tolterodine (DETROL LA) 4 MG 24 hr capsule Take 4 mg by mouth daily.        Marland Kitchen warfarin (COUMADIN) 5 MG tablet As directed by coumadin clinic         Allergies  Allergen Reactions  . Iodinated Diagnostic Agents     History   Social History  . Marital Status: Single    Spouse Name: N/A    Number of Children: N/A  . Years of Education: N/A   Occupational History  . Not on file.   Social History Main Topics  . Smoking status: Never Smoker   . Smokeless tobacco: Not on file  . Alcohol Use: No  . Drug Use: No  . Sexually Active: Not on file   Other Topics Concern  . Not on file   Social History Narrative  . No narrative on file    Family History  Problem Relation Age of Onset  . Hypertension     Physical  Exam: Filed Vitals:   07/26/11 1356  BP: 132/78  Pulse: 80  Height: 5\' 8"  (1.727 m)  Weight: 235 lb 1.9 oz (106.65 kg)    GEN- The patient is anxious appearing, alert and oriented x 3 today.   Head- normocephalic, atraumatic Eyes-  Sclera clear, conjunctiva pink Ears- hearing intact Oropharynx- clear Neck- supple, no JVP Lymph- no cervical lymphadenopathy Lungs- Clear to ausculation bilaterally, normal work of breathing Heart- irrgular rate and rhythm, no murmurs, rubs or gallops, PMI not laterally displaced GI- soft, NT, ND, + BS Extremities- no clubbing, cyanosis, or edema  ekg today reveals sinus rhythm 58 bpm, PR 146, QTc 453, otherwise normal ekg  Assessment and Plan:

## 2012-07-04 ENCOUNTER — Emergency Department (HOSPITAL_COMMUNITY): Payer: Medicare Other

## 2012-07-04 ENCOUNTER — Encounter (HOSPITAL_COMMUNITY): Payer: Self-pay | Admitting: Neurology

## 2012-07-04 ENCOUNTER — Emergency Department (HOSPITAL_COMMUNITY)
Admission: EM | Admit: 2012-07-04 | Discharge: 2012-07-04 | Disposition: A | Discharge status: Home / Self Care | Payer: Medicare Other | Attending: Emergency Medicine | Admitting: Emergency Medicine

## 2012-07-04 DIAGNOSIS — R001 Bradycardia, unspecified: Secondary | ICD-10-CM

## 2012-07-04 DIAGNOSIS — Z8719 Personal history of other diseases of the digestive system: Secondary | ICD-10-CM | POA: Insufficient documentation

## 2012-07-04 DIAGNOSIS — E785 Hyperlipidemia, unspecified: Secondary | ICD-10-CM | POA: Insufficient documentation

## 2012-07-04 DIAGNOSIS — E039 Hypothyroidism, unspecified: Secondary | ICD-10-CM | POA: Insufficient documentation

## 2012-07-04 DIAGNOSIS — R259 Unspecified abnormal involuntary movements: Secondary | ICD-10-CM | POA: Insufficient documentation

## 2012-07-04 DIAGNOSIS — E559 Vitamin D deficiency, unspecified: Secondary | ICD-10-CM | POA: Insufficient documentation

## 2012-07-04 DIAGNOSIS — M810 Age-related osteoporosis without current pathological fracture: Secondary | ICD-10-CM | POA: Insufficient documentation

## 2012-07-04 DIAGNOSIS — I10 Essential (primary) hypertension: Secondary | ICD-10-CM | POA: Insufficient documentation

## 2012-07-04 DIAGNOSIS — Z8679 Personal history of other diseases of the circulatory system: Secondary | ICD-10-CM | POA: Insufficient documentation

## 2012-07-04 DIAGNOSIS — Z7901 Long term (current) use of anticoagulants: Secondary | ICD-10-CM | POA: Insufficient documentation

## 2012-07-04 DIAGNOSIS — I498 Other specified cardiac arrhythmias: Secondary | ICD-10-CM | POA: Insufficient documentation

## 2012-07-04 DIAGNOSIS — E669 Obesity, unspecified: Secondary | ICD-10-CM | POA: Insufficient documentation

## 2012-07-04 DIAGNOSIS — Z79899 Other long term (current) drug therapy: Secondary | ICD-10-CM | POA: Insufficient documentation

## 2012-07-04 DIAGNOSIS — R55 Syncope and collapse: Secondary | ICD-10-CM | POA: Insufficient documentation

## 2012-07-04 LAB — BASIC METABOLIC PANEL
BUN: 18 mg/dL (ref 6–23)
CO2: 26 mEq/L (ref 19–32)
Chloride: 100 mEq/L (ref 96–112)
GFR calc Af Amer: 48 mL/min — ABNORMAL LOW (ref >90)
Glucose, Bld: 97 mg/dL (ref 70–99)
Potassium: 3.7 mEq/L (ref 3.5–5.1)

## 2012-07-04 LAB — CBC WITH DIFFERENTIAL/PLATELET
Basophils Relative: 0 % (ref 0–1)
HCT: 41.8 % (ref 36.0–46.0)
Hemoglobin: 15.1 g/dL — ABNORMAL HIGH (ref 12.0–15.0)
MCHC: 36.1 g/dL — ABNORMAL HIGH (ref 30.0–36.0)
Monocytes Absolute: 1 10*3/uL (ref 0.1–1.0)
Monocytes Relative: 11 % (ref 3–12)
Neutro Abs: 6.1 10*3/uL (ref 1.7–7.7)

## 2012-07-04 LAB — GLUCOSE, CAPILLARY

## 2012-07-04 IMAGING — CT CT HEAD W/O CM
2 series · 16 of 30 positions shown, 20 images · non-contrast
Comparison: None.

CLINICAL DATA: Movement disorder for 6-8 months.

CT HEAD WITHOUT CONTRAST
TECHNIQUE: Contiguous axial images were obtained from the base of
the skull through the vertex without contrast.

[Series 2: head w/o · axial · non-contrast · 0.49mm/px · z∈[+99,+229]mm · 13 of 32 slices shown, 17 images]
[im 3/32  brain]
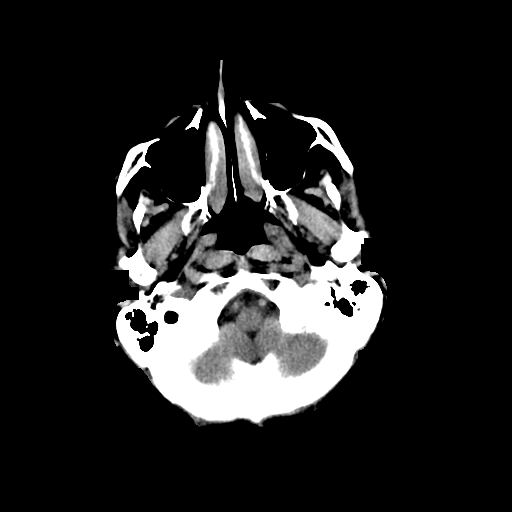
[im 3/32  bone]
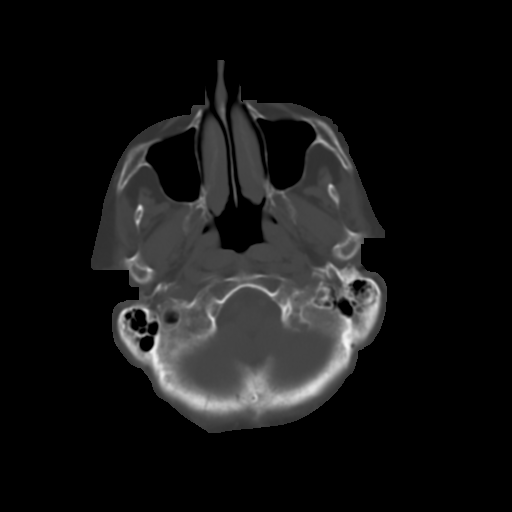
[im 5/32  brain]
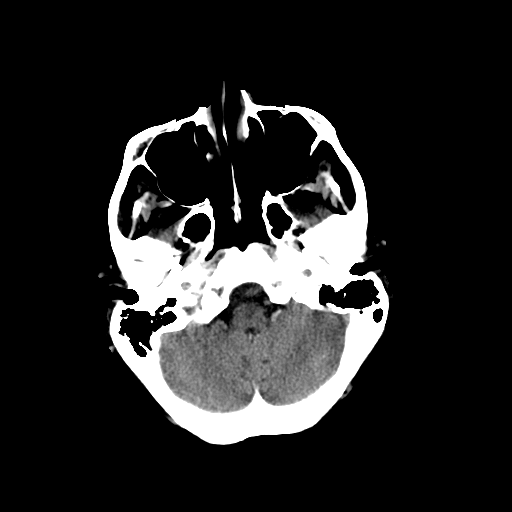
[im 7/32  brain]
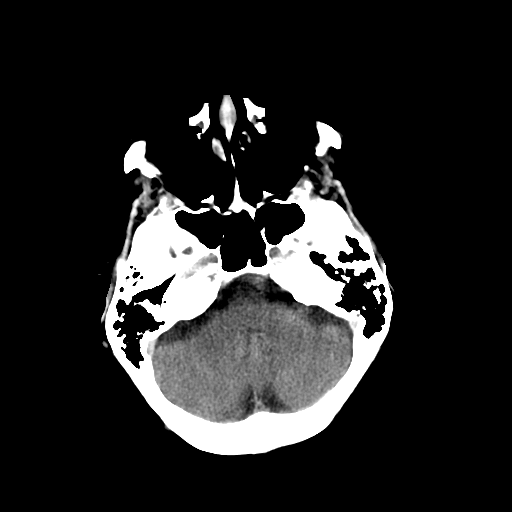
[im 9/32  brain]
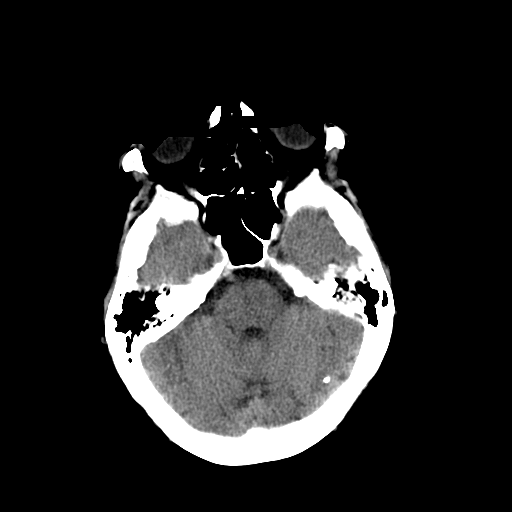
[im 12/32  brain]
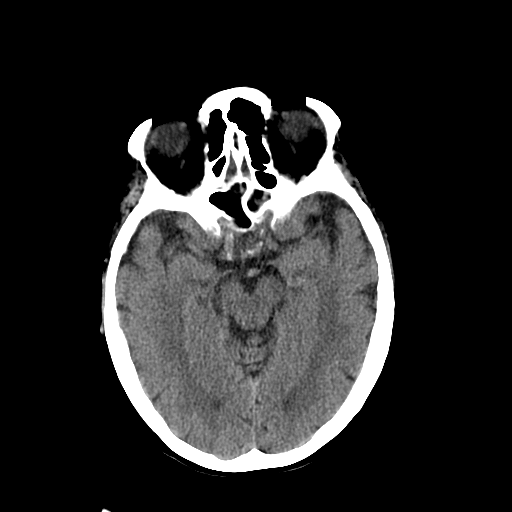
[im 12/32  bone]
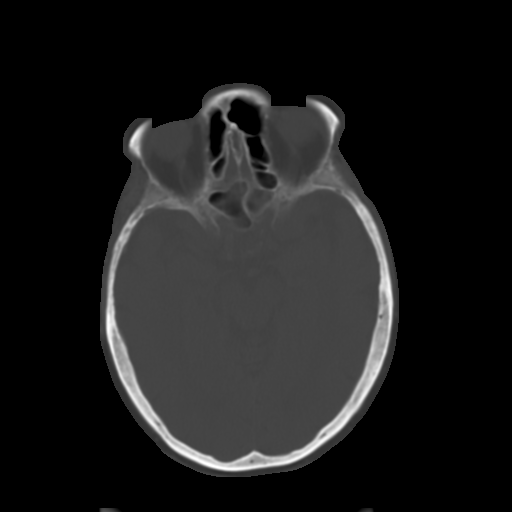
[im 14/32  brain]
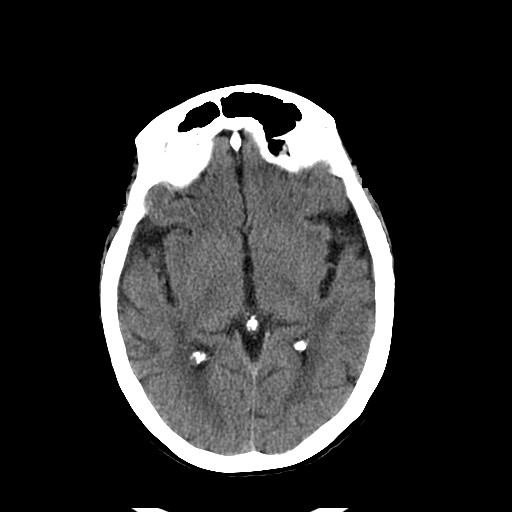
[im 16/32  brain]
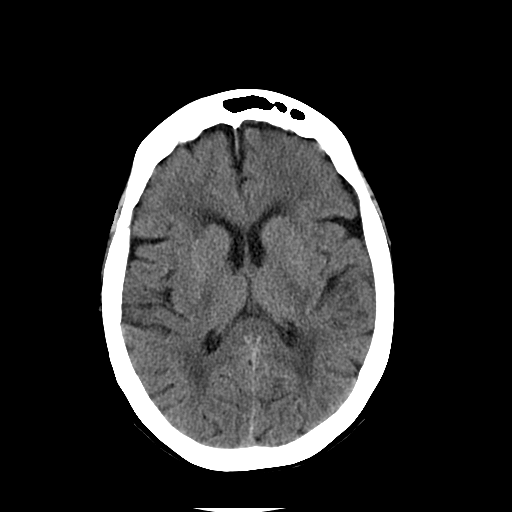
[im 18/32  brain]
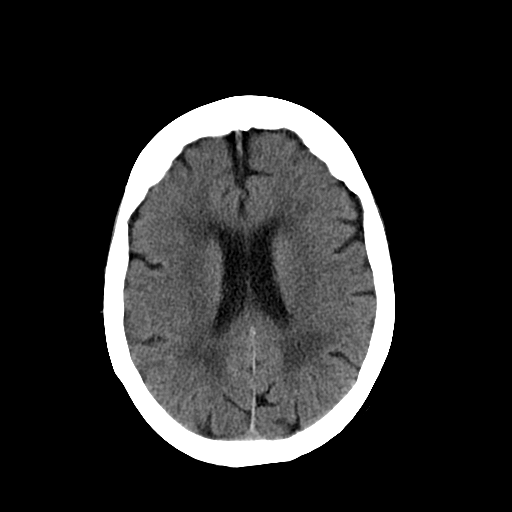
[im 20/32  brain]
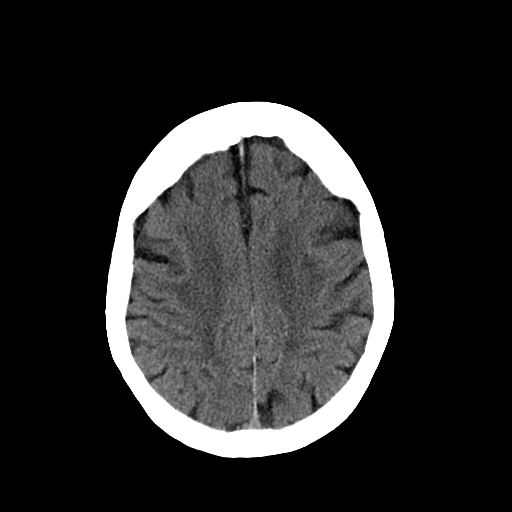
[im 20/32  bone]
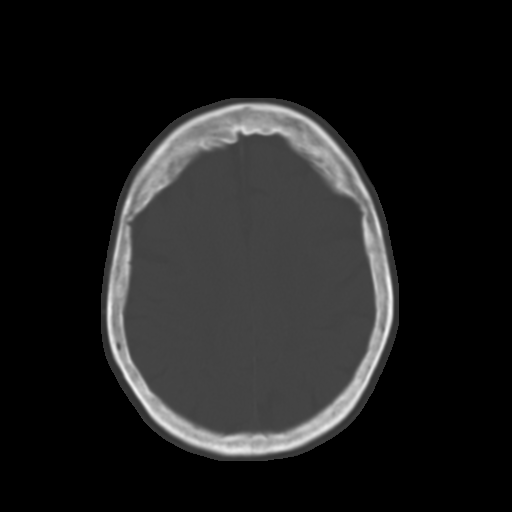
[im 23/32  brain]
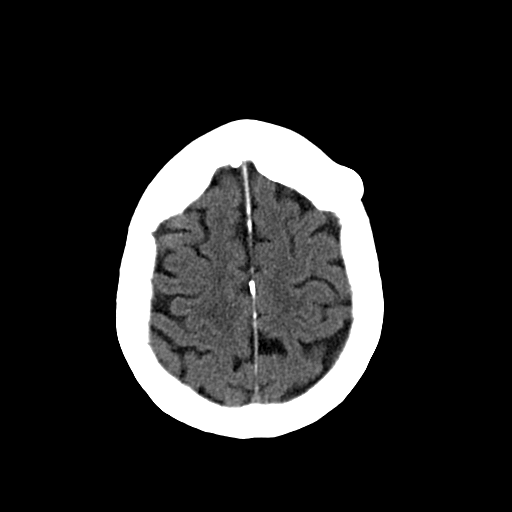
[im 25/32  brain]
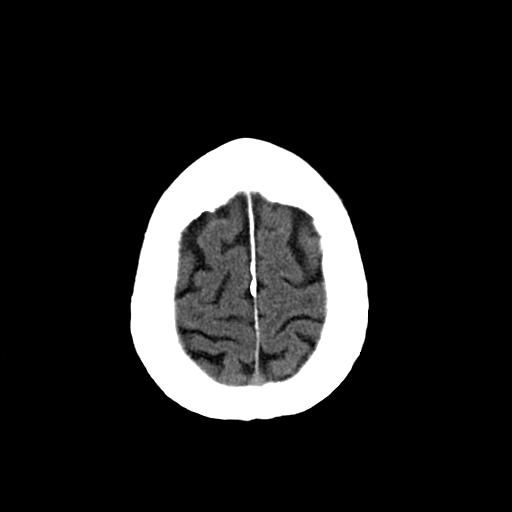
[im 27/32  brain]
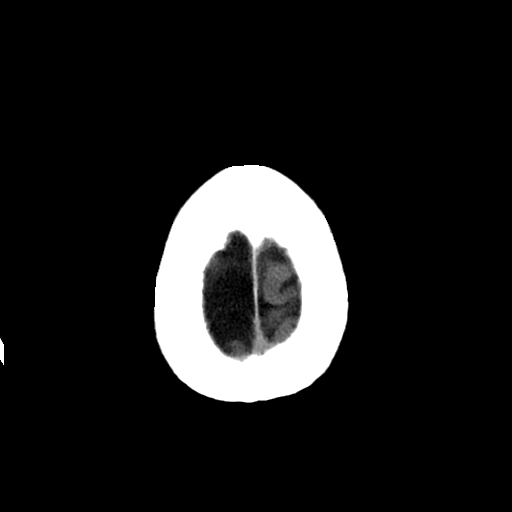
[im 29/32  brain]
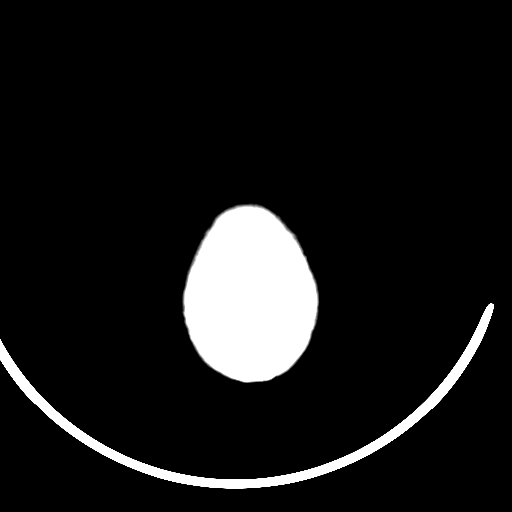
[im 29/32  bone]
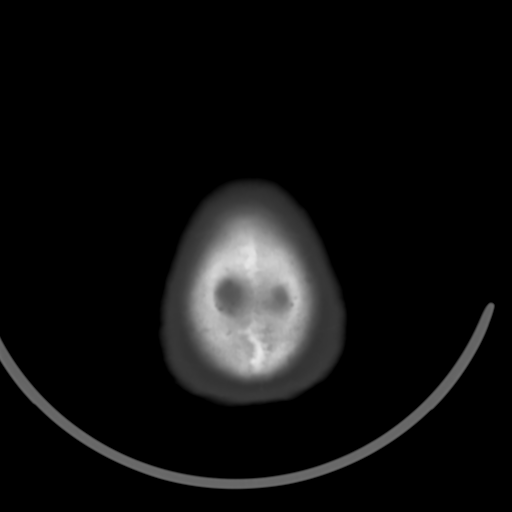

[Series 3: head w/o bone · axial · non-contrast · 0.49mm/px · z∈[+99,+144]mm · 3 of 32 slices shown]
[im 3/32  bone]
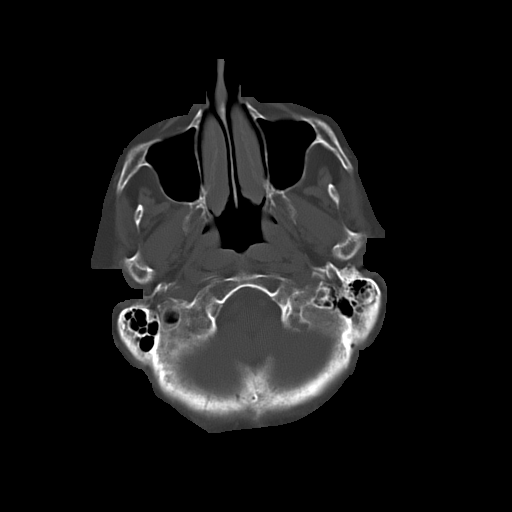
[im 7/32  bone]
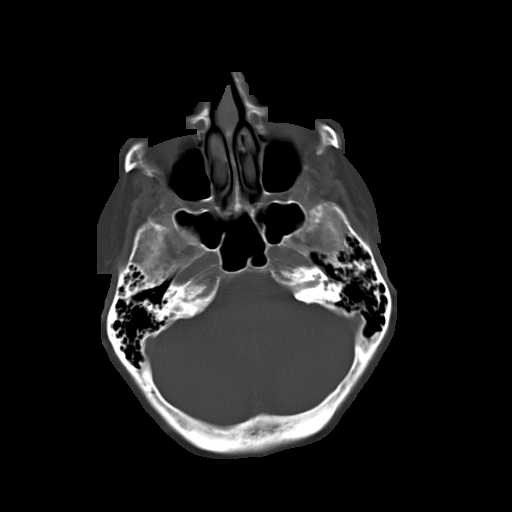
[im 12/32  bone]
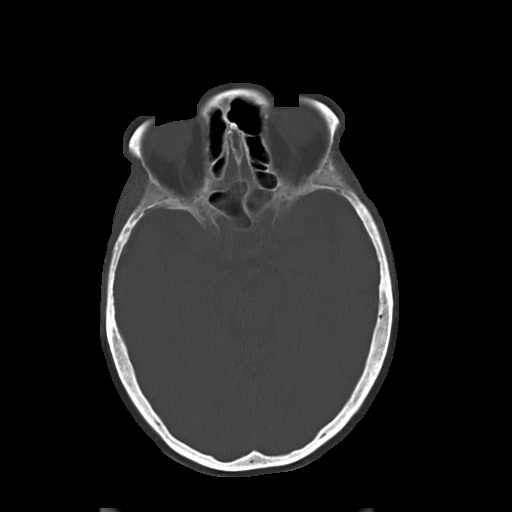

[16 of 30 positions shown; findings below may reference images not displayed]

FINDINGS: There is no evidence for acute infarction, intracranial
hemorrhage, mass lesion, hydrocephalus, or extra-axial fluid.
There is moderate age related atrophy.  There is fairly extensive
hypodensity in the white matter, likely chronic microvascular
ischemic change. Carotid atherosclerosis.  The calvarium is intact.
Sinuses and mastoids are clear.  Negative orbits.
IMPRESSION: Moderate atrophy with extensive chronic microvascular ischemic
change.  No acute intracranial findings.

## 2012-07-04 MED ORDER — SODIUM CHLORIDE 0.9 % IV BOLUS (SEPSIS)
1000.0000 mL | Freq: Once | INTRAVENOUS | Status: AC
  Administered 2012-07-04: 1000 mL via INTRAVENOUS

## 2012-07-04 MED ORDER — LORAZEPAM 1 MG PO TABS
1.0000 mg | ORAL_TABLET | Freq: Once | ORAL | Status: AC
Start: 2012-07-04 — End: 2012-07-04
  Administered 2012-07-04: 1 mg via ORAL
  Filled 2012-07-04: qty 1

## 2012-07-04 MED ORDER — LORAZEPAM 1 MG PO TABS: 1.0000 mg | ORAL_TABLET | Freq: Three times a day (TID) | ORAL | Status: DC | PRN

## 2012-07-04 NOTE — ED Notes (Signed)
Pt resting in bed, watching TV. Pt still has fluid left to receive. Will be discharged following the bolus.

## 2012-07-04 NOTE — ED Notes (Signed)
Shaking had decreased, pt less anxious. Watching TV in bed. A & O x 4. Breathing unlabored.

## 2012-07-04 NOTE — ED Notes (Signed)
Pt reports has had full body shaking for over a year after ablation procedure. Reports shaking is worse on the right side. Pt has weak grip strengths, but equal. Speech is clear. Pt can ambulate with stand by assistance, reports weakness. Is SOB after ambulation.

## 2012-07-04 NOTE — ED Notes (Signed)
Pt ambulated in hallway with walker, reporting some dizziness. Pt shaking is greatly improved, barely recognizable at this time. Pt is calm. Requesting rx for ativan, edp made aware.

## 2012-07-04 NOTE — Consult Note (Signed)
Admit date: 07/04/2012 Referring Physician : Dr. Bebe Shaggy Primary Physician FULP, CAMMIE, MD Primary Cardiologist  Dr. Eldridge Dace Reason for Consultation  Shakiness, history of atrial tachycardia, near syncope  HPI: 79 year old female with a history of atrial tachycardia status post ablation in June of 2012 currently on amiodarone, diltiazem, atenolol who has been battling with weakness over the past year and today he felt increased tremulousness and worry. She came to the emergency department for further evaluation where a head CT was performed and was negative. Her neurologic exam did not reveal any deficits. She was given a low-dose benzodiazepine and now feels better. We were called to evaluate her after her family member in the room made aware to nursing that when she was sitting up in bed after laying down for quite some time to get up to urinate that she transiently had a near syncopal episode where her vision blackened. He noticed that her heart rate at the time increased to 66 on the monitor then decreased back to the upper 40s where she is in currently. Sinus bradycardia. Currently while laying in bed she is comfortable. She has been recently experiencing lower extremity edema and because of this, has been taking Lasix 40 mg twice a day. She believes that her skin is quite dry currently and her son notices an excessively dry mouth.  In review of [facility] medications, she does not have both propafenone and amiodarone in her possession. She is only taking amiodarone 200 mg once a day. Her family member states that she has been feeling poorly for quite some time. In fact, she has had some tremulousness and her free T4 was slightly elevated and she has a endocrinology appointment pending.  She denies any chest pain, no fevers, no chills, no diarrhea, no hair loss.    PMH:   Past Medical History  Diagnosis Date  . Dizziness     chronic and of an unclear etiology  . Hypothyroidism   . HTN  (hypertension)   . Hyperlipemia   . Obesity   . Osteoporosis   . Gastritis   . Vitamin D deficiency   . Atrial tachycardia     ablated 11/17/10  by JA  from the [facility] of the aorta  . Atrial flutter     typical appearing  . Atrial fibrillation     PSH:   Past Surgical History  Procedure Date  . Atrial ablation surgery 11/17/10    Atrial tachycardia arising from [facility] of the aorta ablated by JA   Allergies:  Iodinated diagnostic agents Prior to Admit Meds:   (Not in a hospital admission) Prior to Admission medications   Medication Sig Start Date End Date Taking? Authorizing Provider  acetaminophen (TYLENOL) 325 MG tablet Take 650 mg by mouth every 4 (four) hours as needed. For headache   Yes Historical Provider, MD  alendronate (FOSAMAX) 70 MG tablet Take 70 mg by mouth every 30 (thirty) days. First of month on Saturday. Take with a full glass of water on an empty stomach.   Yes Historical Provider, MD  ALPRAZolam (XANAX) 0.25 MG tablet Take 0.25 mg by mouth 3 (three) times daily as needed. For anxiety   Yes Historical Provider, MD  amiodarone (PACERONE) 200 MG tablet Take 200 mg by mouth 2 (two) times daily.   Yes Historical Provider, MD  atenolol (TENORMIN) 50 MG tablet Take 50 mg by mouth daily.     Yes Historical Provider, MD  Calcium Carbonate-Vitamin D (CALCIUM-VITAMIN D) 600-200 MG-UNIT CAPS  Take 1 capsule by mouth daily.    Yes Historical Provider, MD  Cholecalciferol (VITAMIN D3) 1000 UNITS CAPS Take 1 capsule by mouth daily.   Yes Historical Provider, MD  levothyroxine (SYNTHROID, LEVOTHROID) 88 MCG tablet Take 88 mcg by mouth daily.   Yes Historical Provider, MD  oxybutynin (DITROPAN) 5 MG tablet Take 5 mg by mouth daily as needed.   Yes Historical Provider, MD  pravastatin (PRAVACHOL) 20 MG tablet Take 20 mg by mouth daily.     Yes Historical Provider, MD  warfarin (COUMADIN) 5 MG tablet Take 2.5 mg by mouth daily. As directed by coumadin clinic   Yes Historical Provider, MD    Fam HX:    Family History  Problem Relation Age of Onset  . Hypertension     Social HX:    History   Social History  . Marital Status: Single    Spouse Name: N/A    Number of Children: N/A  . Years of Education: N/A   Occupational History  . Not on file.   Social History Main Topics  . Smoking status: Never Smoker   . Smokeless tobacco: Not on file  . Alcohol Use: No  . Drug Use: No  . Sexually Active: Not on file   Other Topics Concern  . Not on file   Social History Narrative  . No narrative on file     ROS:  All 11 ROS were addressed and are negative except what is stated in the HPI  Physical Exam: Blood pressure 101/60, pulse 49, temperature 97.6 F (36.4 C), temperature source Oral, resp. rate 20, SpO2 100.00%.    General: Well developed, well nourished, in no acute distress, elderly Head: Eyes PERRLA, No xanthomas.   Normal cephalic and atramatic  Lungs:   Clear bilaterally to auscultation and percussion. Normal respiratory effort. No wheezes, no rales. Heart:  Brady S1 S2 Pulses are 2+ & equal. No murmur,  No carotid bruit. No JVD.  No abdominal bruits. No femoral bruits. Abdomen: Bowel sounds are positive, abdomen soft and non-tender without masses. No hepatosplenomegaly. Obese Msk:  Back normal. Normal strength and tone for age. Extremities:   No clubbing, cyanosis or mild LE edema.  DP +1 Neuro: Alert and oriented X 3, non-focal, MAE x 4 GU: Deferred Rectal: Deferred Psych:  Good affect, responds appropriately    Labs:   Lab Results  Component Value Date   WBC 9.2 07/04/2012   HGB 15.1* 07/04/2012   HCT 41.8 07/04/2012   MCV 89.5 07/04/2012   PLT 182 07/04/2012    Lab 07/04/12 1058  NA 142  K 3.7  CL 100  CO2 26  BUN 18  CREATININE 1.22*  CALCIUM 9.8  PROT --  BILITOT --  ALKPHOS --  ALT --  AST --  GLUCOSE 97   No results found for this basename: PTT   Lab Results  Component Value Date   INR 3.62* 07/04/2012   INR 1.24  01/11/2011   INR 1.20 11/16/2010   Lab Results  Component Value Date   CKTOTAL 59 11/16/2010   CKMB 1.1 11/16/2010   TROPONINI  Value: <0.30        Due to the release kinetics of cTnI, a negative result within the first hours of the onset of symptoms does not rule out myocardial infarction with certainty. If myocardial infarction is still suspected, repeat the test at appropriate intervals. 11/16/2010      Radiology:  Ct Head Wo Contrast  07/04/2012  *RADIOLOGY REPORT*  Clinical Data: Movement disorder for 6-8 months.  CT HEAD WITHOUT CONTRAST  Technique:  Contiguous axial images were obtained from the base of the skull through the vertex without contrast.  Comparison: None.  Findings: There is no evidence for acute infarction, intracranial hemorrhage, mass lesion, hydrocephalus, or extra-axial fluid. There is moderate age related atrophy.  There is fairly extensive hypodensity in the white matter, likely chronic microvascular ischemic change. Carotid atherosclerosis.  The calvarium is intact. Sinuses and mastoids are clear.  Negative orbits.  IMPRESSION: Moderate atrophy with extensive chronic microvascular ischemic change.  No acute intracranial findings.   Original Report Authenticated By: Davonna Belling, M.D.    Personally viewed.  EKG:  Artifact, not afib, sinus brady clearly seen on monitor. No ST changes.  Personally viewed. Prior ECG at Dr. Recardo Evangelist office with similar artifact.   ASSESSMENT/PLAN:   79 year old female currently with sinus bradycardia, near syncopal episode, recent increase in Lasix administration, on both atenolol and diltiazem as well as amiodarone for previous difficult to control atrial tachycardia which has been ablated here with tremulousness now improved.  1. Bradycardia-I have reviewed her medications. Once again, she is only on amiodarone not on Rythmol. Because of her persistent bradycardia, sinus bradycardia, I have elected to stop her diltiazem which may also help with her  lower extremity edema, continue with atenolol 50 mg, and decrease her amiodarone to 100 mg a day. Of course, if her tachycardia arrhythmia returns, we may need to increase her amiodarone once again. She does have the sensation at times of feeling her heart race, but at last clinic evaluation/EKG with Dr. Abigail Miyamoto her heart rate was normal/bradycardic. Difficult to completely sort out. She may need a monitor once again in the future. I do believe that there was an orthostatic component to her near syncopal episode. Stopping Lasix. Liberalize fluid intake over the next 24 hours. I discussed with her.  She also has a slightly elevated free T4. This may be from amiodarone as well as levothyroxin supplementation. It appears that she has an endocrinology appointment in the future.  Head CT was normal. I do believe from a cardiovascular standpoint that she may be able to be discharged with close followup. I will arrange. I have discussed with Dr. Bebe Shaggy.  Donato Schultz, MD  07/04/2012  2:42 PM

## 2012-07-04 NOTE — ED Notes (Signed)
Pt reporting shaking x 1 year. Pt supposed to go have thyroid examined in February. At night can't sleep because doesn't feel right. Pt is alert and oriented. Pt sob and shaking. Speaking in clear sentences. Denies any pain.

## 2012-07-04 NOTE — ED Provider Notes (Signed)
History     CSN: [id number]  Arrival date & time 07/04/12  5621   First MD Initiated Contact with Patient 07/04/12 0848      Chief Complaint  Patient presents with  . Weakness     HPI Tremors Onset - over a year ago Course - worsening Worsened by - nothing Improved by - nothing  Pt presents for shaking in all extremities.  She reports this has been present for over a year but seems worse.  Reports seen recently by PCP with labs done which showed "low heart rate" and change in thyroid function.  Denies cp/sob.  No focal weakness.  No headache.  No visual changes Denies etoh use No falls She does reports feeling lightheaded but this is not new   Past Medical History  Diagnosis Date  . Dizziness     chronic and of an unclear etiology  . Hypothyroidism   . HTN (hypertension)   . Hyperlipemia   . Obesity   . Osteoporosis   . Gastritis   . Vitamin D deficiency   . Atrial tachycardia     ablated 11/17/10  by JA  from the [facility] of the aorta  . Atrial flutter     typical appearing  . Atrial fibrillation     Past Surgical History  Procedure Date  . Atrial ablation surgery 11/17/10    Atrial tachycardia arising from [facility] of the aorta ablated by JA    Family History  Problem Relation Age of Onset  . Hypertension      History  Substance Use Topics  . Smoking status: Never Smoker   . Smokeless tobacco: Not on file  . Alcohol Use: No    OB History    Grav Para Term Preterm Abortions TAB SAB Ect Mult Living                  Review of Systems  Constitutional: Negative for fever.  Respiratory: Negative for chest tightness.   Cardiovascular: Negative for chest pain.  Musculoskeletal: Negative for back pain.  Neurological: Positive for tremors.  Psychiatric/Behavioral: Negative for agitation.  All other systems reviewed and are negative.    Allergies  Iodinated diagnostic agents  Home Medications   Current Outpatient Rx  Name  Route  Sig  Dispense  Refill    . ACETAMINOPHEN 325 MG PO TABS   Oral   Take 650 mg by mouth every 4 (four) hours as needed. For headache         . ALENDRONATE SODIUM 70 MG PO TABS   Oral   Take 70 mg by mouth every 30 (thirty) days. First of month on Saturday. Take with a full glass of water on an empty stomach.         . ALPRAZOLAM 0.25 MG PO TABS   Oral   Take 0.25 mg by mouth 3 (three) times daily as needed. For anxiety         . AMIODARONE HCL 200 MG PO TABS   Oral   Take 200 mg by mouth 2 (two) times daily.         . ATENOLOL 50 MG PO TABS   Oral   Take 50 mg by mouth daily.           Marland Kitchen CALCIUM-VITAMIN D 600-200 MG-UNIT PO CAPS   Oral   Take 1 capsule by mouth daily.          Marland Kitchen VITAMIN D3 1000 UNITS PO CAPS  Oral   Take 1 capsule by mouth daily.         Marland Kitchen DILTIAZEM HCL 120 MG PO TABS   Oral   Take 120 mg by mouth daily.           . FUROSEMIDE 40 MG PO TABS   Oral   Take 40 mg by mouth 2 (two) times daily.         Marland Kitchen LEVOTHYROXINE SODIUM 88 MCG PO TABS   Oral   Take 88 mcg by mouth daily.         . OXYBUTYNIN CHLORIDE 5 MG PO TABS   Oral   Take 5 mg by mouth daily as needed.         Marland Kitchen PRAVASTATIN SODIUM 20 MG PO TABS   Oral   Take 20 mg by mouth daily.           Marland Kitchen PROPAFENONE HCL 150 MG PO TABS   Oral   Take 150 mg by mouth every 8 (eight) hours.         . WARFARIN SODIUM 5 MG PO TABS   Oral   Take 2.5 mg by mouth daily. As directed by coumadin clinic           BP 145/75  Pulse 56  Temp 98.1 F (36.7 C) (Oral)  Resp 19  SpO2 100%  Physical Exam CONSTITUTIONAL: Well developed/well nourished, anxious HEAD AND FACE: Normocephalic/atraumatic EYES: EOMI/PERRL ENMT: Mucous membranes moist NECK: supple no meningeal signs SPINE:entire spine nontender CV: S1/S2 noted, no murmurs/rubs/gallops noted LUNGS: Lungs are clear to auscultation bilaterally, no apparent distress ABDOMEN: soft, nontender, no rebound or guarding GU:no cva tenderness NEURO:  Pt is awake/alert, moves all extremitiesx4.  No arm/leg drift.  She has tremor to bilateral UE extremities that will improve while being interviewed EXTREMITIES: pulses normal, full ROM SKIN: warm, color normal PSYCH: anxious  ED Course  Procedures  Labs Reviewed  GLUCOSE, CAPILLARY - Abnormal; Notable for the following:    Glucose-Capillary 101 (*)     All other components within normal limits  10:51 AM PCP notes from 06/30/12 Thyroid studies performed (elevated Free T4 but all other labs negative) Diagnosed with anxiety Will add on basic labs here including INR 2:01 PM Pt persistently bradycardic and is on multiple medications that could contribute to her bradycardia She reports brief episode of LOC while in the ED but no falls/seizure reported and no apparent change in monitor readings She sees [facility] - d/w dr Anne Fu to eval patient as I suspect she may be overmedicated with cardiac meds 3:35 PM Pt improved Seen by skains - he made med adjustments.  Feel she could use a liter of NS and then d/c home.  Likely has had overdiuresis.   Pt has f/u with [facility] in two days Pt is comfortable with plan   MDM  Nursing notes including past medical history and social history reviewed and considered in documentation Labs/vital reviewed and considered        Date: 07/04/2012  Rate: 114  Rhythm: indeterminate  QRS Axis: normal  Intervals: normal  ST/T Wave abnormalities: nonspecific ST changes  Conduction Disutrbances:none  Narrative Interpretation: significant artifact noted due to tremor/shaking   Date: 07/04/2012 1059am  Rate: 48  Rhythm: sinus bradycardia  QRS Axis: normal  Intervals: QT prolonged  ST/T Wave abnormalities: nonspecific ST changes  Conduction Disutrbances:none     Joya Gaskins, MD 07/04/12 1536

## 2012-07-10 ENCOUNTER — Emergency Department (HOSPITAL_COMMUNITY)
Admission: EM | Admit: 2012-07-10 | Discharge: 2012-07-10 | Disposition: A | Discharge status: Home / Self Care | Payer: Medicare Other | Attending: Emergency Medicine | Admitting: Emergency Medicine

## 2012-07-10 ENCOUNTER — Encounter (HOSPITAL_COMMUNITY): Payer: Self-pay | Admitting: Emergency Medicine

## 2012-07-10 ENCOUNTER — Emergency Department (HOSPITAL_COMMUNITY): Payer: Medicare Other

## 2012-07-10 DIAGNOSIS — R0789 Other chest pain: Secondary | ICD-10-CM | POA: Insufficient documentation

## 2012-07-10 DIAGNOSIS — Z8679 Personal history of other diseases of the circulatory system: Secondary | ICD-10-CM | POA: Insufficient documentation

## 2012-07-10 DIAGNOSIS — R079 Chest pain, unspecified: Secondary | ICD-10-CM

## 2012-07-10 DIAGNOSIS — I10 Essential (primary) hypertension: Secondary | ICD-10-CM | POA: Insufficient documentation

## 2012-07-10 DIAGNOSIS — Z79899 Other long term (current) drug therapy: Secondary | ICD-10-CM | POA: Insufficient documentation

## 2012-07-10 DIAGNOSIS — E669 Obesity, unspecified: Secondary | ICD-10-CM | POA: Insufficient documentation

## 2012-07-10 DIAGNOSIS — E785 Hyperlipidemia, unspecified: Secondary | ICD-10-CM | POA: Insufficient documentation

## 2012-07-10 DIAGNOSIS — E039 Hypothyroidism, unspecified: Secondary | ICD-10-CM | POA: Insufficient documentation

## 2012-07-10 DIAGNOSIS — Z8719 Personal history of other diseases of the digestive system: Secondary | ICD-10-CM | POA: Insufficient documentation

## 2012-07-10 DIAGNOSIS — M810 Age-related osteoporosis without current pathological fracture: Secondary | ICD-10-CM | POA: Insufficient documentation

## 2012-07-10 DIAGNOSIS — F411 Generalized anxiety disorder: Secondary | ICD-10-CM | POA: Insufficient documentation

## 2012-07-10 DIAGNOSIS — Z7901 Long term (current) use of anticoagulants: Secondary | ICD-10-CM | POA: Insufficient documentation

## 2012-07-10 DIAGNOSIS — F419 Anxiety disorder, unspecified: Secondary | ICD-10-CM

## 2012-07-10 LAB — COMPREHENSIVE METABOLIC PANEL
BUN: 15 mg/dL (ref 6–23)
CO2: 24 mEq/L (ref 19–32)
Calcium: 9.1 mg/dL (ref 8.4–10.5)
Creatinine, Ser: 1.02 mg/dL (ref 0.50–1.10)
GFR calc Af Amer: 60 mL/min — ABNORMAL LOW (ref >90)
GFR calc non Af Amer: 52 mL/min — ABNORMAL LOW (ref >90)
Glucose, Bld: 142 mg/dL — ABNORMAL HIGH (ref 70–99)

## 2012-07-10 LAB — CBC WITH DIFFERENTIAL/PLATELET
Eosinophils Relative: 1 % (ref 0–5)
HCT: 38.5 % (ref 36.0–46.0)
Lymphocytes Relative: 9 % — ABNORMAL LOW (ref 12–46)
Lymphs Abs: 0.9 10*3/uL (ref 0.7–4.0)
MCV: 91.4 fL (ref 78.0–100.0)
Monocytes Absolute: 1.1 10*3/uL — ABNORMAL HIGH (ref 0.1–1.0)
Monocytes Relative: 11 % (ref 3–12)
RBC: 4.21 MIL/uL (ref 3.87–5.11)
RDW: 14.7 % (ref 11.5–15.5)
WBC: 9.7 10*3/uL (ref 4.0–10.5)

## 2012-07-10 LAB — POCT I-STAT TROPONIN I: Troponin i, poc: 0 ng/mL (ref 0.00–0.08)

## 2012-07-10 LAB — PROTIME-INR: INR: 2.98 — ABNORMAL HIGH (ref 0.00–1.49)

## 2012-07-10 IMAGING — CR DG CHEST 2V
2 series · 2 of 2 positions shown · non-contrast
Comparison: [DATE]

CLINICAL DATA: Chest pain

CHEST - 2 VIEW

[x chest ap]
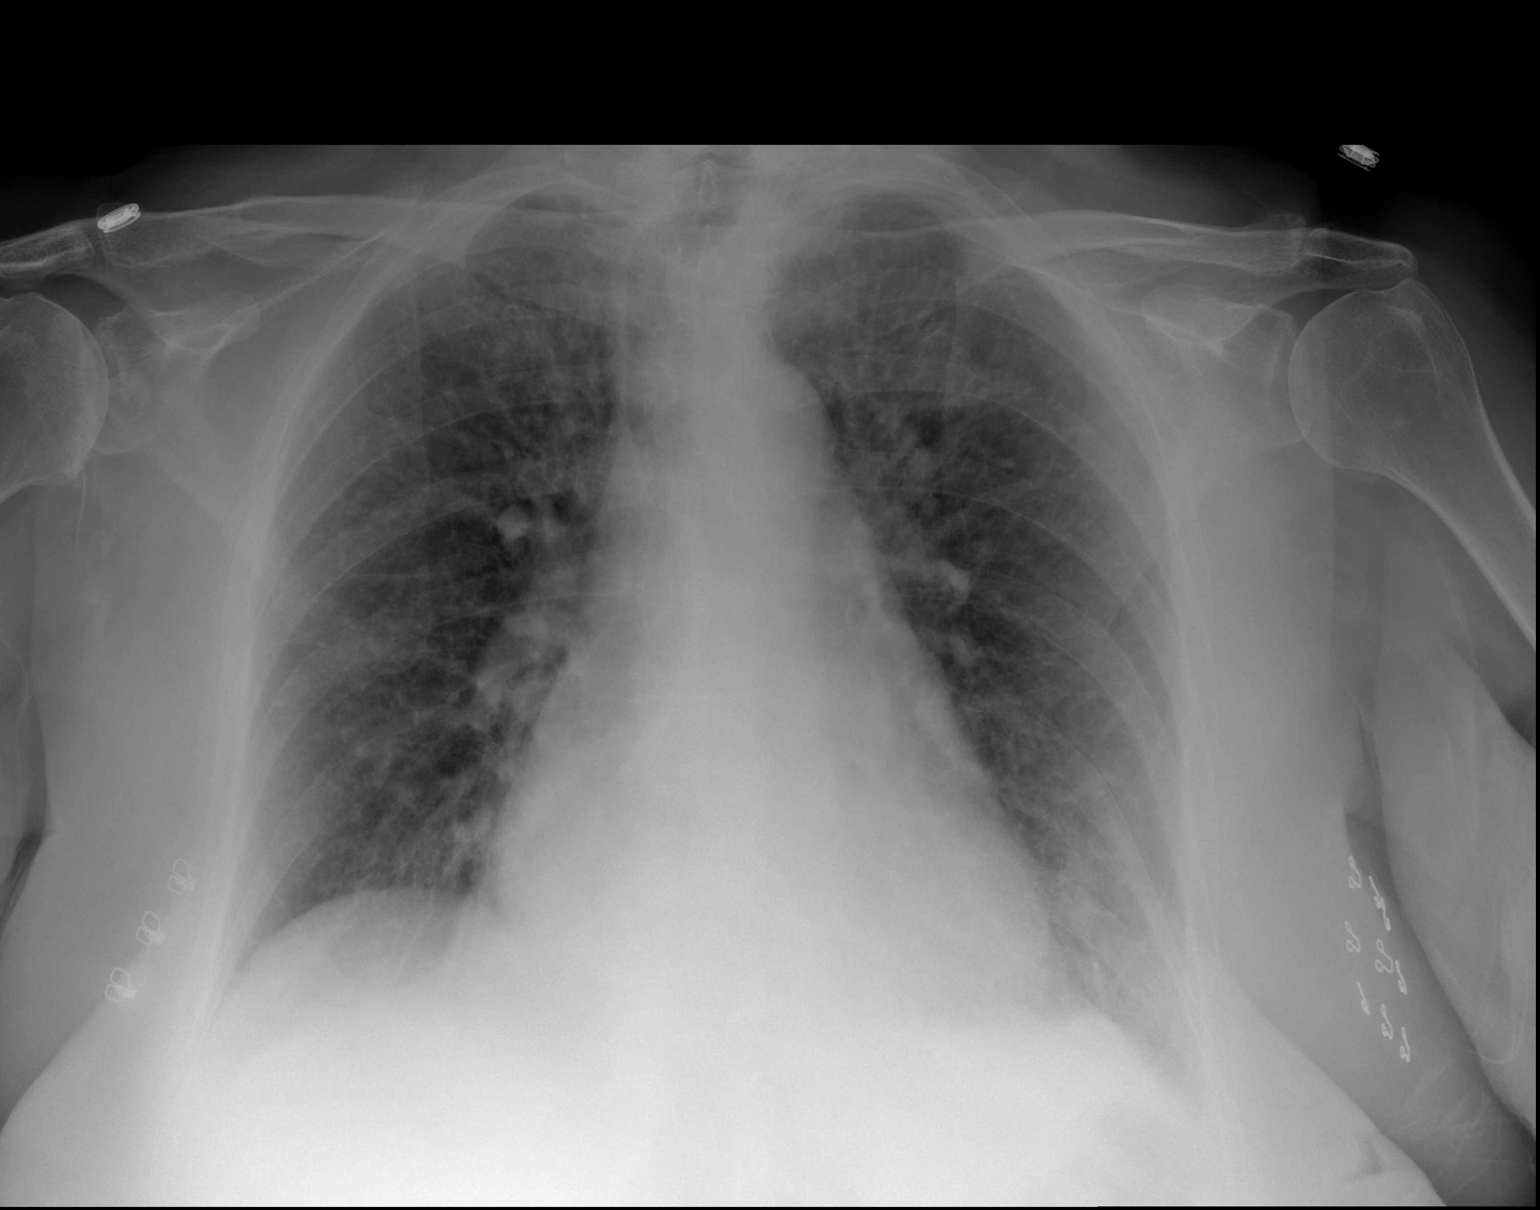

[w chest lat]
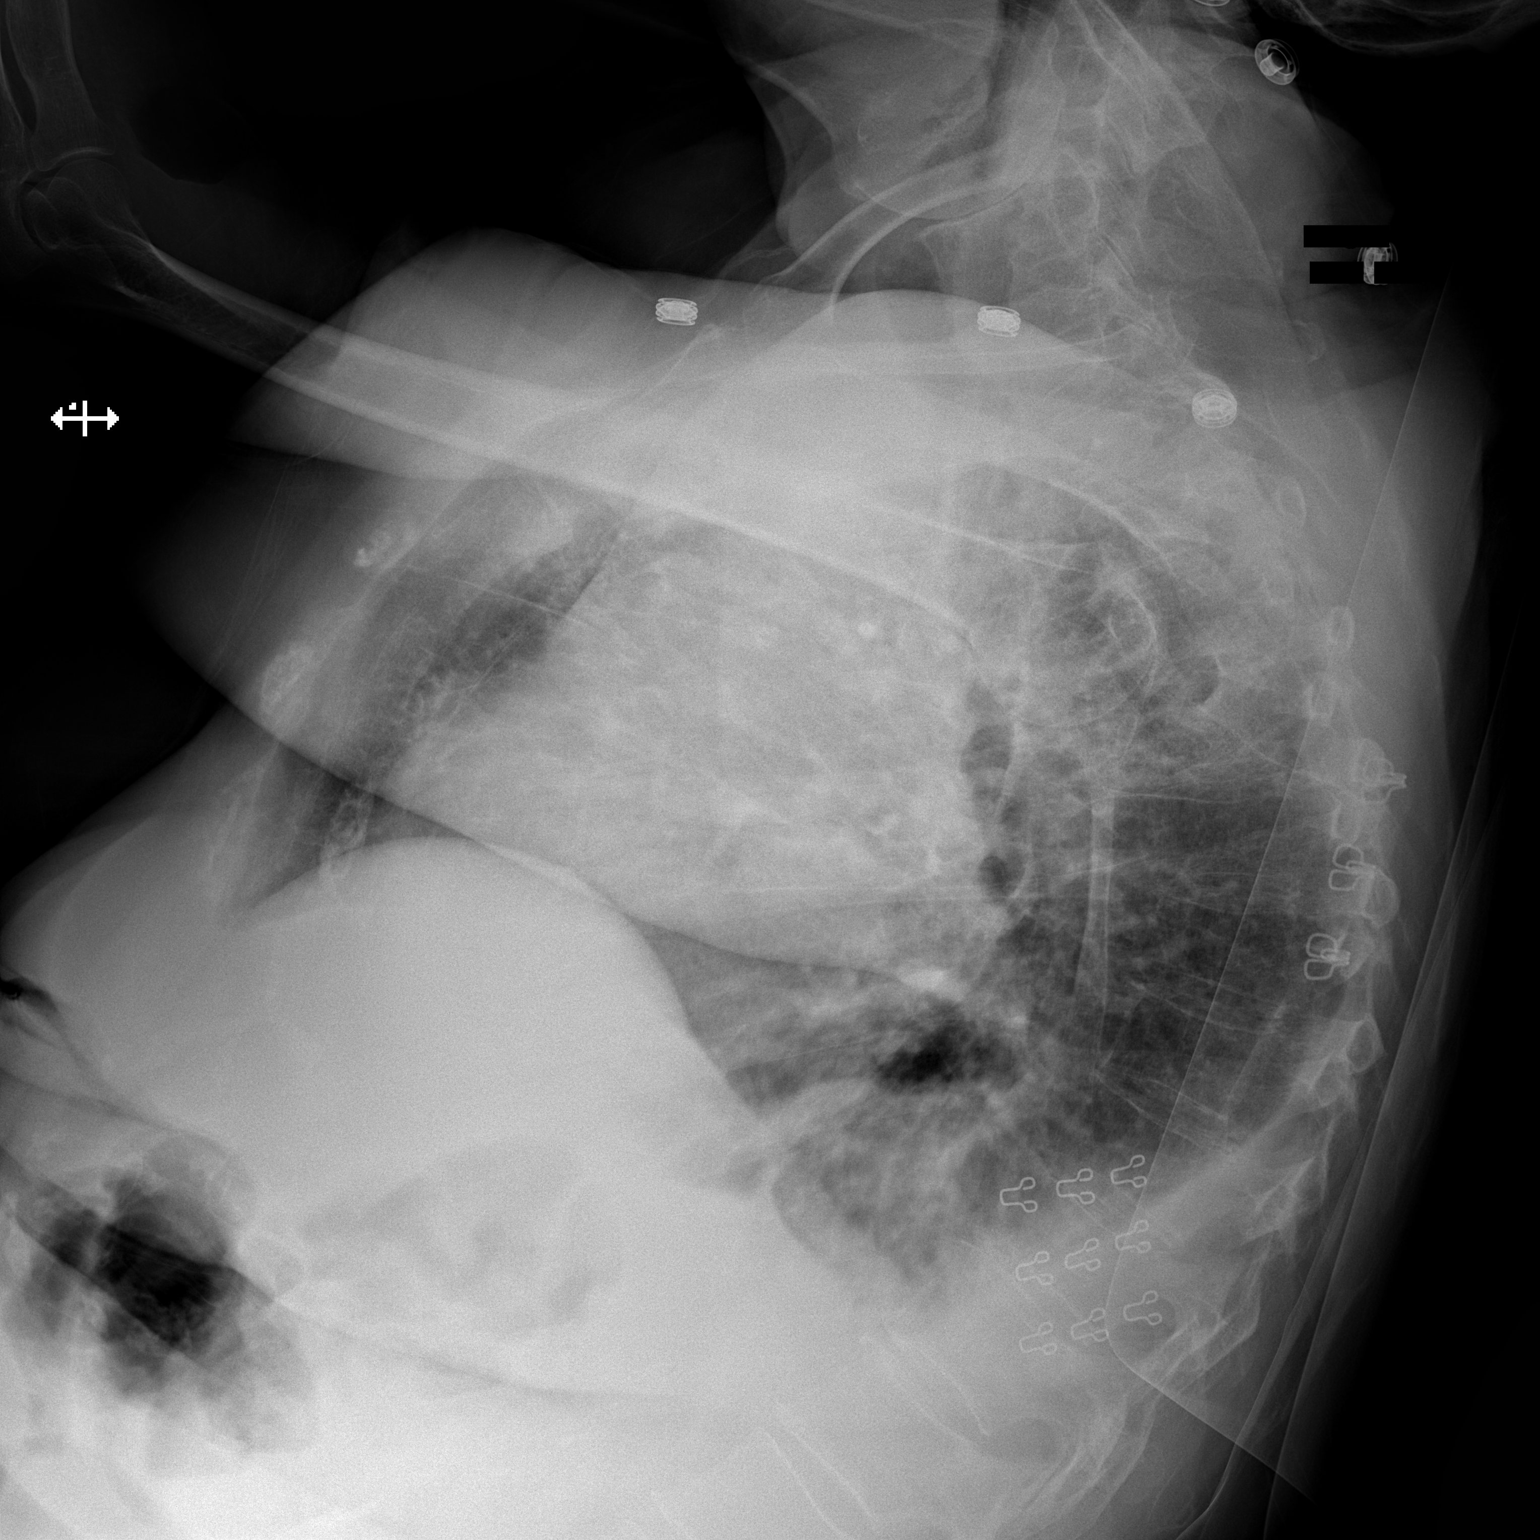

[2 of 2 positions shown; findings below may reference images not displayed]

FINDINGS: Hyperinflation and interstitial coarsening.  Layering
pleural effusions.  Bibasilar opacities, left greater than right.
Osteopenia.  No displaced osseous abnormality identified.
IMPRESSION: Chronic lung changes with hyperexpansion and interstitial
prominence.  Superimposed interstitial edema not excluded.

There are small effusions and bibasilar opacities; atelectasis
versus infiltrate.

## 2012-07-10 MED ORDER — LORAZEPAM BOLUS VIA INFUSION: 1.0000 mg | Freq: Once | INTRAVENOUS | Status: DC

## 2012-07-10 MED ORDER — ASPIRIN 81 MG PO CHEW
324.0000 mg | CHEWABLE_TABLET | Freq: Once | ORAL | Status: AC
  Administered 2012-07-10: 324 mg via ORAL
  Filled 2012-07-10: qty 4

## 2012-07-10 MED ORDER — LORAZEPAM 2 MG/ML IJ SOLN
1.0000 mg | Freq: Once | INTRAMUSCULAR | Status: AC
  Administered 2012-07-10: 1 mg via INTRAVENOUS
  Filled 2012-07-10: qty 1

## 2012-07-10 MED ORDER — AZITHROMYCIN 250 MG PO TABS: ORAL_TABLET | ORAL | Status: DC

## 2012-07-10 NOTE — ED Notes (Signed)
PT. REPORTS MID CHEST PAIN WITH GENERALIZED WEAKNESS , SLIGHT SOB AND OCCASIONAL DRY COUGH ONSET THIS EVENING , DENIES NAUSEA OR DIAPHORESIS .

## 2012-07-10 NOTE — ED Provider Notes (Signed)
History     CSN: [id number]  Arrival date & time 07/10/12  0234   First MD Initiated Contact with Patient 07/10/12 0251      Chief Complaint  Patient presents with  . Chest Pain    (Consider location/radiation/quality/duration/timing/severity/associated sxs/prior treatment) HPI History provided by pt.   Pt has nearly constant tremors, teeth chattering and sensation that her heart is racing or is going to "beat out of her chest" as well as intermittent pre-syncope.  Per prior chart, was seen for these symptoms on 07/04/12, was bradycardic at that time, cardiology consulted and adjusted her cardiac medications.  She was discharged home w/ ativan as well. Pt presents tonight with the same symptoms, + constant, substernal chest soreness or pressure x ~3 hours.  Has never experienced this chest pain in the past.  Usually has relief w/ ativan but no improvement today.  No associated SOB, nausea or diaphoresis.  Has had a cough x 1 week but no fever.  Denies trauma to chest.  Pt had a cardiac cath in 12/2010 that was significant for narrow distal LAD, likely secondary to vasospasm.  Past Medical History  Diagnosis Date  . Dizziness     chronic and of an unclear etiology  . Hypothyroidism   . HTN (hypertension)   . Hyperlipemia   . Obesity   . Osteoporosis   . Gastritis   . Vitamin D deficiency   . Atrial tachycardia     ablated 11/17/10  by JA  from the [facility] of the aorta  . Atrial flutter     typical appearing  . Atrial fibrillation     Past Surgical History  Procedure Date  . Atrial ablation surgery 11/17/10    Atrial tachycardia arising from [facility] of the aorta ablated by JA    Family History  Problem Relation Age of Onset  . Hypertension      History  Substance Use Topics  . Smoking status: Never Smoker   . Smokeless tobacco: Not on file  . Alcohol Use: No    OB History    Grav Para Term Preterm Abortions TAB SAB Ect Mult Living                  Review of Systems  All  other systems reviewed and are negative.    Allergies  Iodinated diagnostic agents  Home Medications   Current Outpatient Rx  Name  Route  Sig  Dispense  Refill  . ACETAMINOPHEN 325 MG PO TABS   Oral   Take 650 mg by mouth every 4 (four) hours as needed. For headache         . ALENDRONATE SODIUM 70 MG PO TABS   Oral   Take 70 mg by mouth every 30 (thirty) days. First of month on Saturday. Take with a full glass of water on an empty stomach.         . AMIODARONE HCL 200 MG PO TABS   Oral   Take 200 mg by mouth 2 (two) times daily.         . ATENOLOL 50 MG PO TABS   Oral   Take 50 mg by mouth daily.           Marland Kitchen CALCIUM-VITAMIN D 600-200 MG-UNIT PO CAPS   Oral   Take 1 capsule by mouth daily.          Marland Kitchen VITAMIN D3 1000 UNITS PO CAPS   Oral   Take 1 capsule  by mouth daily.         Marland Kitchen LEVOTHYROXINE SODIUM 88 MCG PO TABS   Oral   Take 88 mcg by mouth daily.         Marland Kitchen LORAZEPAM 1 MG PO TABS   Oral   Take 1 tablet (1 mg total) by mouth 3 (three) times daily as needed for anxiety.   10 tablet   0   . OXYBUTYNIN CHLORIDE 5 MG PO TABS   Oral   Take 5 mg by mouth daily as needed.         Marland Kitchen PRAVASTATIN SODIUM 20 MG PO TABS   Oral   Take 20 mg by mouth daily.           . WARFARIN SODIUM 5 MG PO TABS   Oral   Take 2.5 mg by mouth daily. As directed by coumadin clinic           BP 147/70  Temp 97.9 F (36.6 C) (Oral)  Resp 18  SpO2 95%  Physical Exam  Nursing note and vitals reviewed. Constitutional: She is oriented to person, place, and time. She appears well-developed and well-nourished. No distress.  HENT:  Head: Normocephalic and atraumatic.  Eyes:       Normal appearance  Neck: Normal range of motion.  Cardiovascular: Normal rate, regular rhythm and intact distal pulses.   Pulmonary/Chest: Effort normal and breath sounds normal. No respiratory distress. She exhibits no tenderness.       No pleuritic pain reported  Abdominal: Soft.  Bowel sounds are normal. She exhibits no distension. There is no tenderness. There is no guarding.  Musculoskeletal: Normal range of motion.       No peripheral edema or calf tenderness  Neurological: She is alert and oriented to person, place, and time.       tremulous  Skin: Skin is warm and dry. No rash noted.  Psychiatric: She has a normal mood and affect. Her behavior is normal.       Anxious appearing.      ED Course  Procedures (including critical care time)   Date: 07/10/2012  Rate: 63  Rhythm: normal sinus rhythm  QRS Axis: normal  Intervals: normal  ST/T Wave abnormalities: ST depressions laterally  Conduction Disutrbances:none  Narrative Interpretation:   Old EKG Reviewed: unchanged   Labs Reviewed  CBC WITH DIFFERENTIAL - Abnormal; Notable for the following:    Neutrophils Relative 79 (*)     Lymphocytes Relative 9 (*)     Monocytes Absolute 1.1 (*)     All other components within normal limits  COMPREHENSIVE METABOLIC PANEL - Abnormal; Notable for the following:    Glucose, Bld 142 (*)     Albumin 3.1 (*)     GFR calc non Af Amer 52 (*)     GFR calc Af Amer 60 (*)     All other components within normal limits  PROTIME-INR - Abnormal; Notable for the following:    Prothrombin Time 29.4 (*)     INR 2.98 (*)     All other components within normal limits  POCT I-STAT TROPONIN I   Dg Chest 2 View  07/10/2012  *RADIOLOGY REPORT*  Clinical Data: Chest pain  CHEST - 2 VIEW  Comparison: 11/16/2010  Findings: Hyperinflation and interstitial coarsening.  Layering pleural effusions.  Bibasilar opacities, left greater than right. Osteopenia.  No displaced osseous abnormality identified.  IMPRESSION: Chronic lung changes with hyperexpansion and interstitial prominence.  Superimposed interstitial  edema not excluded.  There are small effusions and bibasilar opacities; atelectasis versus infiltrate.   Original Report Authenticated By: Jearld Lesch, M.D.      1. Chest  pain   2. Anxiety       MDM  79yo F presents w/ c/o chest pain.  Has severe anxiety.  Was seen in ED for anxiety symptoms, including presyncope on 07/04/12, was bradycardic on exam, cardiology consulted and adjusted her cardiac meds, and pt d/c'd home w/ ativan.  The sx she is experiencing today are similar w/ new addition of atypical CP.  She had a cardiac cath in 12/2010 that showed narrow distal LAD but otherwise normal.  On exam, pt is afebrile, VS w/in nml range, very anxious appearing and tremulous and CP is not reproducible.  CXR shows possible bilateral lower lobe pneumonia.  Will treat d/t 1 week h/o cough.  EKG non-ischemic and serial troponins neg.  Pt received IV ativan and pain resolved approx 15 minutes later.  She is currently CP free.  I suspect that pain is related to her anxiety.  Consulted Dr. Anne Fu and he will be sure that she can be seen in the office w/in the next 48 hours.  Dr. Rulon Abide is comfortable with this plan.  D/c'd home.  6:46 AM         Otilio Miu, PA-C 07/10/12 [phone]

## 2012-07-10 NOTE — ED Provider Notes (Signed)
Medical screening examination/treatment/procedure(s) were conducted as a shared visit with non-physician practitioner(s) and myself.  I personally evaluated the patient during the encounter Charlotte Castro, M.D.  [name] is a 79 y.o. female presents with dizziness, chest pain, anxiety and tremor.  These symptoms are not acute, they have been re-occurring over the last year and a half, both son and pt are frustrated.  Chest pain is a soreness x3 hours PTA, had relief with Ativan in the ED but not at home.  Ativan also relieved Tremor in ED.  Denies associated N/V, diaphoresis, radiation of pain. Pt had a cardiac cath in 12/2010 that was significant for narrow distal LAD, likely secondary to vasospasm.  Patient denies any fevers or chills, changes in vision, earache, sore throat, neck pain or stiffness, syncope, dyspnea, cough, wheezing,  abdominal pain, nausea, vomiting, diarrhea, melena, red bloody stools, frequency, dysuria, myalgias, arthralgias, back pain, recent trauma, rash, itching, skin lesions, easy bruising or bleeding, headache, seizures.  VITAL SIGNS:   Filed Vitals:   07/10/12 0715  BP: 157/66  Pulse: 57  Temp:   Resp: 18   CONSTITUTIONAL: Awake, oriented, appears non-toxic HENT: Atraumatic, normocephalic, oral mucosa pink and moist, airway patent. Nares patent without drainage. External ears normal. EYES: Conjunctiva clear, EOMI, PERRLA NECK: Trachea midline, non-tender, supple CARDIOVASCULAR: Normal heart rate, Normal rhythm, No murmurs, rubs, gallops PULMONARY/CHEST: Clear to auscultation, no rhonchi, wheezes, or rales. Symmetrical breath sounds. Non-tender. ABDOMINAL: Non-distended, soft, non-tender - no rebound or guarding.  BS normal. NEUROLOGIC: Non-focal, moving all four extremities, no gross sensory or motor deficits. EXTREMITIES: No clubbing, cyanosis, or edema SKIN: Warm, Dry, No erythema, No rash  Labs Reviewed  CBC WITH DIFFERENTIAL - Abnormal; Notable for the  following:    Neutrophils Relative 79 (*)     Lymphocytes Relative 9 (*)     Monocytes Absolute 1.1 (*)     All other components within normal limits  COMPREHENSIVE METABOLIC PANEL - Abnormal; Notable for the following:    Glucose, Bld 142 (*)     Albumin 3.1 (*)     GFR calc non Af Amer 52 (*)     GFR calc Af Amer 60 (*)     All other components within normal limits  PROTIME-INR - Abnormal; Notable for the following:    Prothrombin Time 29.4 (*)     INR 2.98 (*)     All other components within normal limits  POCT I-STAT TROPONIN I  POCT I-STAT TROPONIN I    [name] is a 79 y.o. female presents with assorted symptoms that have been chronic, intermittent CP, dizziness, anxiety, palpitations and tremor.  PT has had medication adjustments but symptoms persiste.  Son thinks this Pt worries about her medical conditions too frequently and this causes anxiety.  Pt is much improved with Ativan in ED, she is pain free, EKG shows no changes c/w ishemia or infarction and Trop x2 are WNL.  I don't think this pt has ACS or CVA or TIA.  Doubt posterior circulation infarct.  Pts problems are chronic with the exception fo slightly different CP today which I don't think is related to ACS - troponin should be positive and Ativan shouldn't necessaily alleviate her discomfort.  Suspect anxiety as cause of CP.  PA has arranged f/u with cardiology later today.      Charlotte Skene, MD 07/11/12 [phone]

## 2012-08-07 ENCOUNTER — Emergency Department (HOSPITAL_COMMUNITY)
Admission: EM | Admit: 2012-08-07 | Discharge: 2012-08-07 | Disposition: A | Discharge status: Home / Self Care | Payer: Medicare Other | Attending: Emergency Medicine | Admitting: Emergency Medicine

## 2012-08-07 ENCOUNTER — Emergency Department (HOSPITAL_COMMUNITY): Payer: Medicare Other

## 2012-08-07 ENCOUNTER — Encounter (HOSPITAL_COMMUNITY): Payer: Self-pay | Admitting: *Deleted

## 2012-08-07 DIAGNOSIS — R251 Tremor, unspecified: Secondary | ICD-10-CM

## 2012-08-07 DIAGNOSIS — Z9861 Coronary angioplasty status: Secondary | ICD-10-CM | POA: Insufficient documentation

## 2012-08-07 DIAGNOSIS — E039 Hypothyroidism, unspecified: Secondary | ICD-10-CM | POA: Insufficient documentation

## 2012-08-07 DIAGNOSIS — Z8669 Personal history of other diseases of the nervous system and sense organs: Secondary | ICD-10-CM | POA: Insufficient documentation

## 2012-08-07 DIAGNOSIS — F449 Dissociative and conversion disorder, unspecified: Secondary | ICD-10-CM | POA: Insufficient documentation

## 2012-08-07 DIAGNOSIS — R0602 Shortness of breath: Secondary | ICD-10-CM | POA: Insufficient documentation

## 2012-08-07 DIAGNOSIS — Z8719 Personal history of other diseases of the digestive system: Secondary | ICD-10-CM | POA: Insufficient documentation

## 2012-08-07 DIAGNOSIS — M810 Age-related osteoporosis without current pathological fracture: Secondary | ICD-10-CM | POA: Insufficient documentation

## 2012-08-07 DIAGNOSIS — F411 Generalized anxiety disorder: Secondary | ICD-10-CM | POA: Insufficient documentation

## 2012-08-07 DIAGNOSIS — Z79899 Other long term (current) drug therapy: Secondary | ICD-10-CM | POA: Insufficient documentation

## 2012-08-07 DIAGNOSIS — Z7901 Long term (current) use of anticoagulants: Secondary | ICD-10-CM | POA: Insufficient documentation

## 2012-08-07 DIAGNOSIS — Z9889 Other specified postprocedural states: Secondary | ICD-10-CM | POA: Insufficient documentation

## 2012-08-07 DIAGNOSIS — I10 Essential (primary) hypertension: Secondary | ICD-10-CM | POA: Insufficient documentation

## 2012-08-07 DIAGNOSIS — E559 Vitamin D deficiency, unspecified: Secondary | ICD-10-CM | POA: Insufficient documentation

## 2012-08-07 DIAGNOSIS — E785 Hyperlipidemia, unspecified: Secondary | ICD-10-CM | POA: Insufficient documentation

## 2012-08-07 DIAGNOSIS — Z8679 Personal history of other diseases of the circulatory system: Secondary | ICD-10-CM | POA: Insufficient documentation

## 2012-08-07 DIAGNOSIS — E669 Obesity, unspecified: Secondary | ICD-10-CM | POA: Insufficient documentation

## 2012-08-07 DIAGNOSIS — R259 Unspecified abnormal involuntary movements: Secondary | ICD-10-CM | POA: Insufficient documentation

## 2012-08-07 DIAGNOSIS — I498 Other specified cardiac arrhythmias: Secondary | ICD-10-CM | POA: Insufficient documentation

## 2012-08-07 LAB — POCT I-STAT, CHEM 8
Chloride: 107 mEq/L (ref 96–112)
HCT: 46 % (ref 36.0–46.0)
Hemoglobin: 15.6 g/dL — ABNORMAL HIGH (ref 12.0–15.0)
Potassium: 3.9 mEq/L (ref 3.5–5.1)

## 2012-08-07 LAB — CBC WITH DIFFERENTIAL/PLATELET
Basophils Relative: 0 % (ref 0–1)
Eosinophils Absolute: 0.1 10*3/uL (ref 0.0–0.7)
Eosinophils Relative: 1 % (ref 0–5)
HCT: 42.7 % (ref 36.0–46.0)
Hemoglobin: 15.3 g/dL — ABNORMAL HIGH (ref 12.0–15.0)
MCH: 33.3 pg (ref 26.0–34.0)
MCHC: 35.8 g/dL (ref 30.0–36.0)
Monocytes Absolute: 1 10*3/uL (ref 0.1–1.0)
Monocytes Relative: 10 % (ref 3–12)
Neutrophils Relative %: 73 % (ref 43–77)

## 2012-08-07 LAB — COMPREHENSIVE METABOLIC PANEL
Albumin: 3.7 g/dL (ref 3.5–5.2)
BUN: 15 mg/dL (ref 6–23)
Calcium: 10.3 mg/dL (ref 8.4–10.5)
Creatinine, Ser: 0.97 mg/dL (ref 0.50–1.10)
Total Protein: 7.5 g/dL (ref 6.0–8.3)

## 2012-08-07 LAB — POCT I-STAT TROPONIN I: Troponin i, poc: 0 ng/mL (ref 0.00–0.08)

## 2012-08-07 LAB — PROTIME-INR: Prothrombin Time: 25.4 seconds — ABNORMAL HIGH (ref 11.6–15.2)

## 2012-08-07 IMAGING — CT CT NECK W/O CM
2 series · 10 of 14 positions shown, 12 images · non-contrast
Comparison: None.

CLINICAL DATA: Difficulty swallowing.  Treatment for Parkinson's
disease was recently initiated.  Allergy to iodinated contrast

CT NECK WITHOUT CONTRAST
TECHNIQUE: Multidetector CT imaging of the neck was performed
without intravenous contrast.

[Series 2: 2cc/30ml and 1cc/45ml · axial · 0.51mm/px · z∈[-206,-31]mm · 8 of 91 slices shown, 10 images]
[im 11/91  soft-tissue]
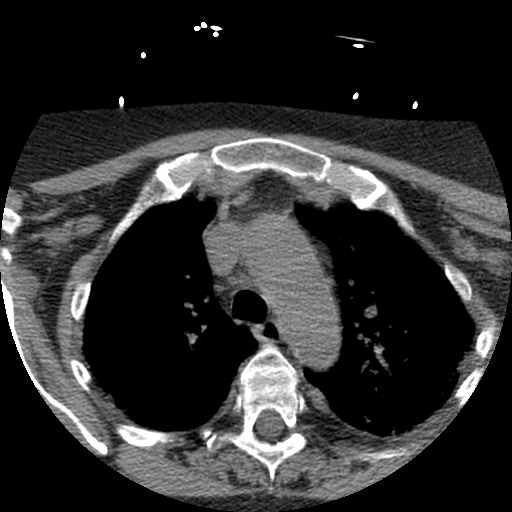
[im 11/91  bone]
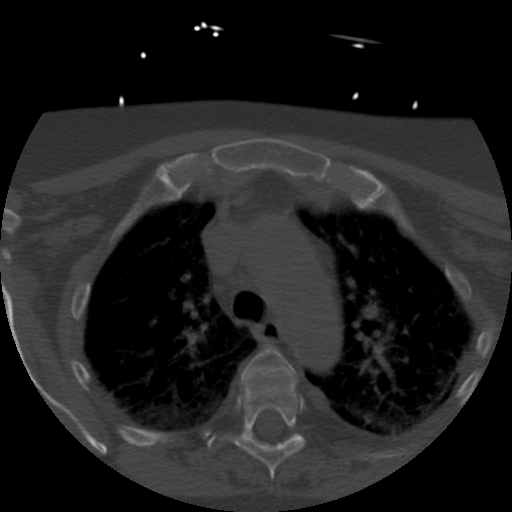
[im 21/91  bone]
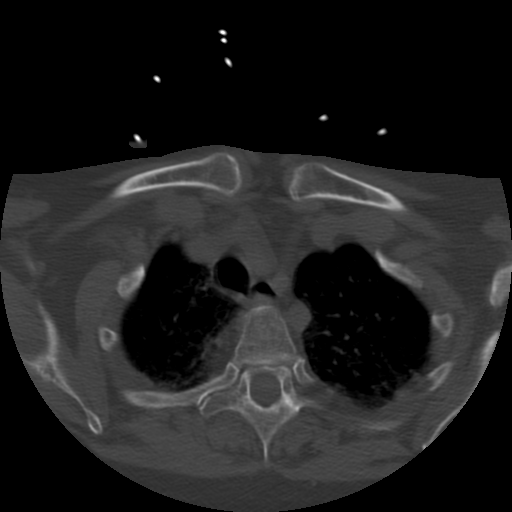
[im 31/91  bone]
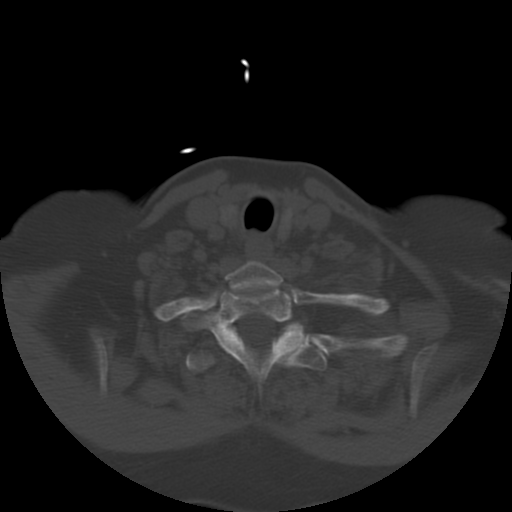
[im 41/91  bone]
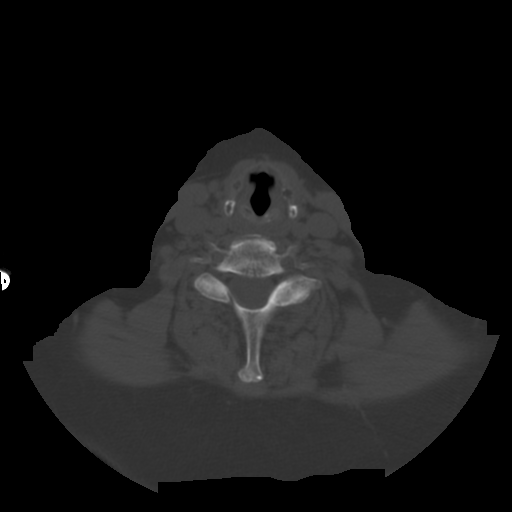
[im 51/91  soft-tissue]
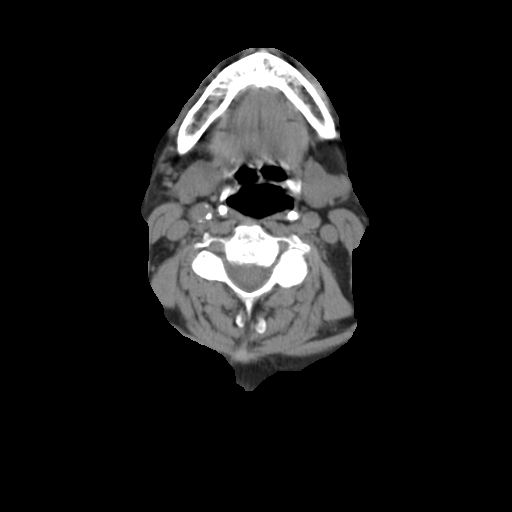
[im 51/91  bone]
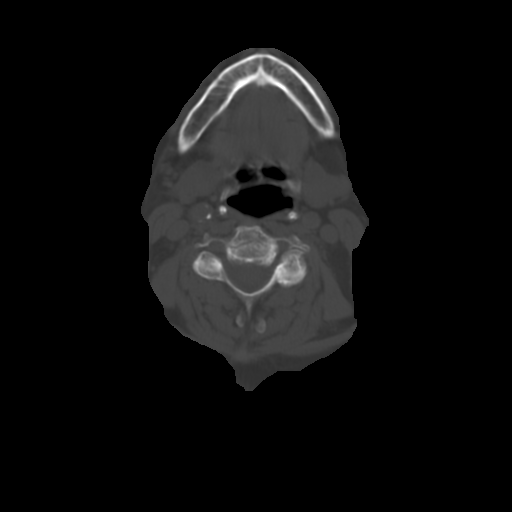
[im 61/91  bone]
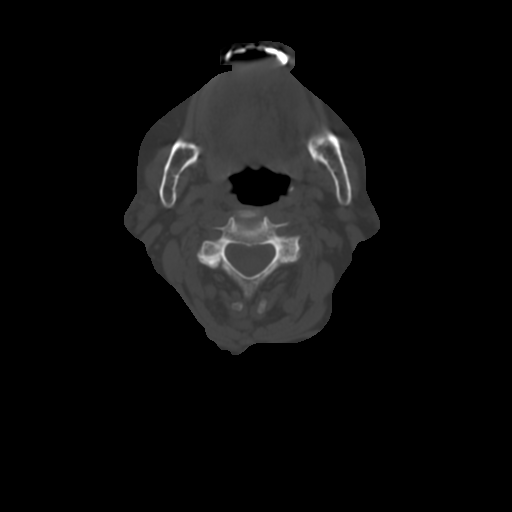
[im 71/91  bone]
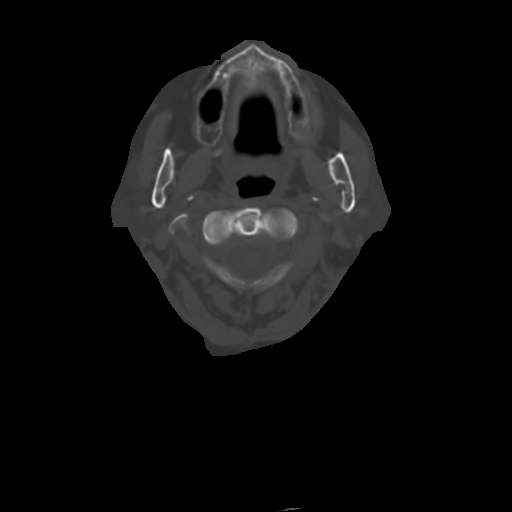
[im 81/91  bone]
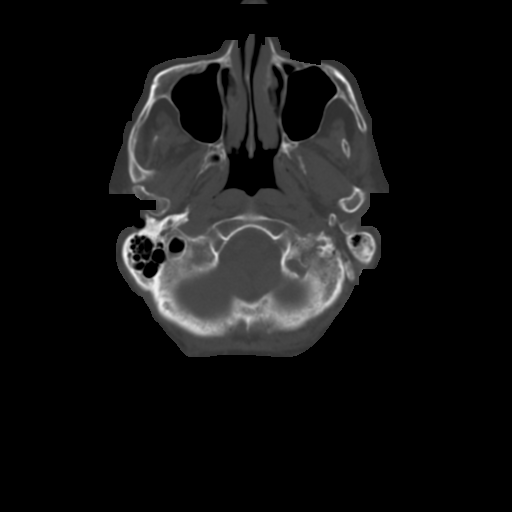

[Series 4: recon 3: 2cc/30ml and 1cc/45ml · axial · 0.56mm/px · z∈[-204,-179]mm · 2 of 31 slices shown]
[im 11/31  bone]
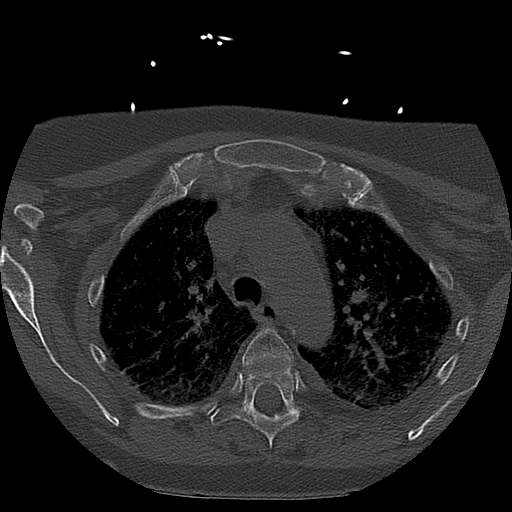
[im 21/31  bone]
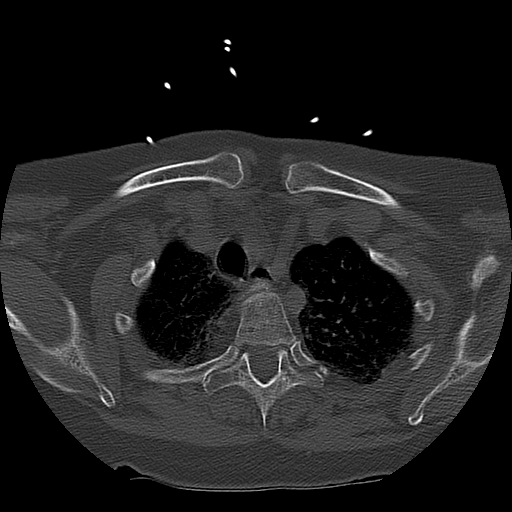

[10 of 14 positions shown; findings below may reference images not displayed]

FINDINGS: No focal mucosal or submucosal lesions are present.
Vocal cords are within normal limits and symmetric.  The trachea
and esophagus are unremarkable.  The thyroid is within normal
limits.

No significant cervical adenopathy is present.  Atherosclerotic
calcifications are present bilaterally without evidence for
significant stenosis by calcific plaque.  Mild dependent
atelectasis is present at the lung apices bilaterally.

Moderate spondylosis of cervical spine is most evident the C4-5 and
C5-6 with a broad-based disc osteophyte complex at both levels.
Foraminal narrowing is worse on the left.  Degenerative
anterolisthesis is present at C3-4.
IMPRESSION: 1.  No acute or focal lesion to explain the patient's symptoms.
2.  Mild dependent atelectasis at the lung apices bilaterally.
3.  Moderate spondylosis of the cervical spine.

## 2012-08-07 IMAGING — CR DG CHEST 2V
2 series · 2 of 2 positions shown · non-contrast
Comparison: [DATE]

CLINICAL DATA: Difficulty swallowing, recent treatment for
Parkinson's

CHEST - 2 VIEW

[x chest ap]
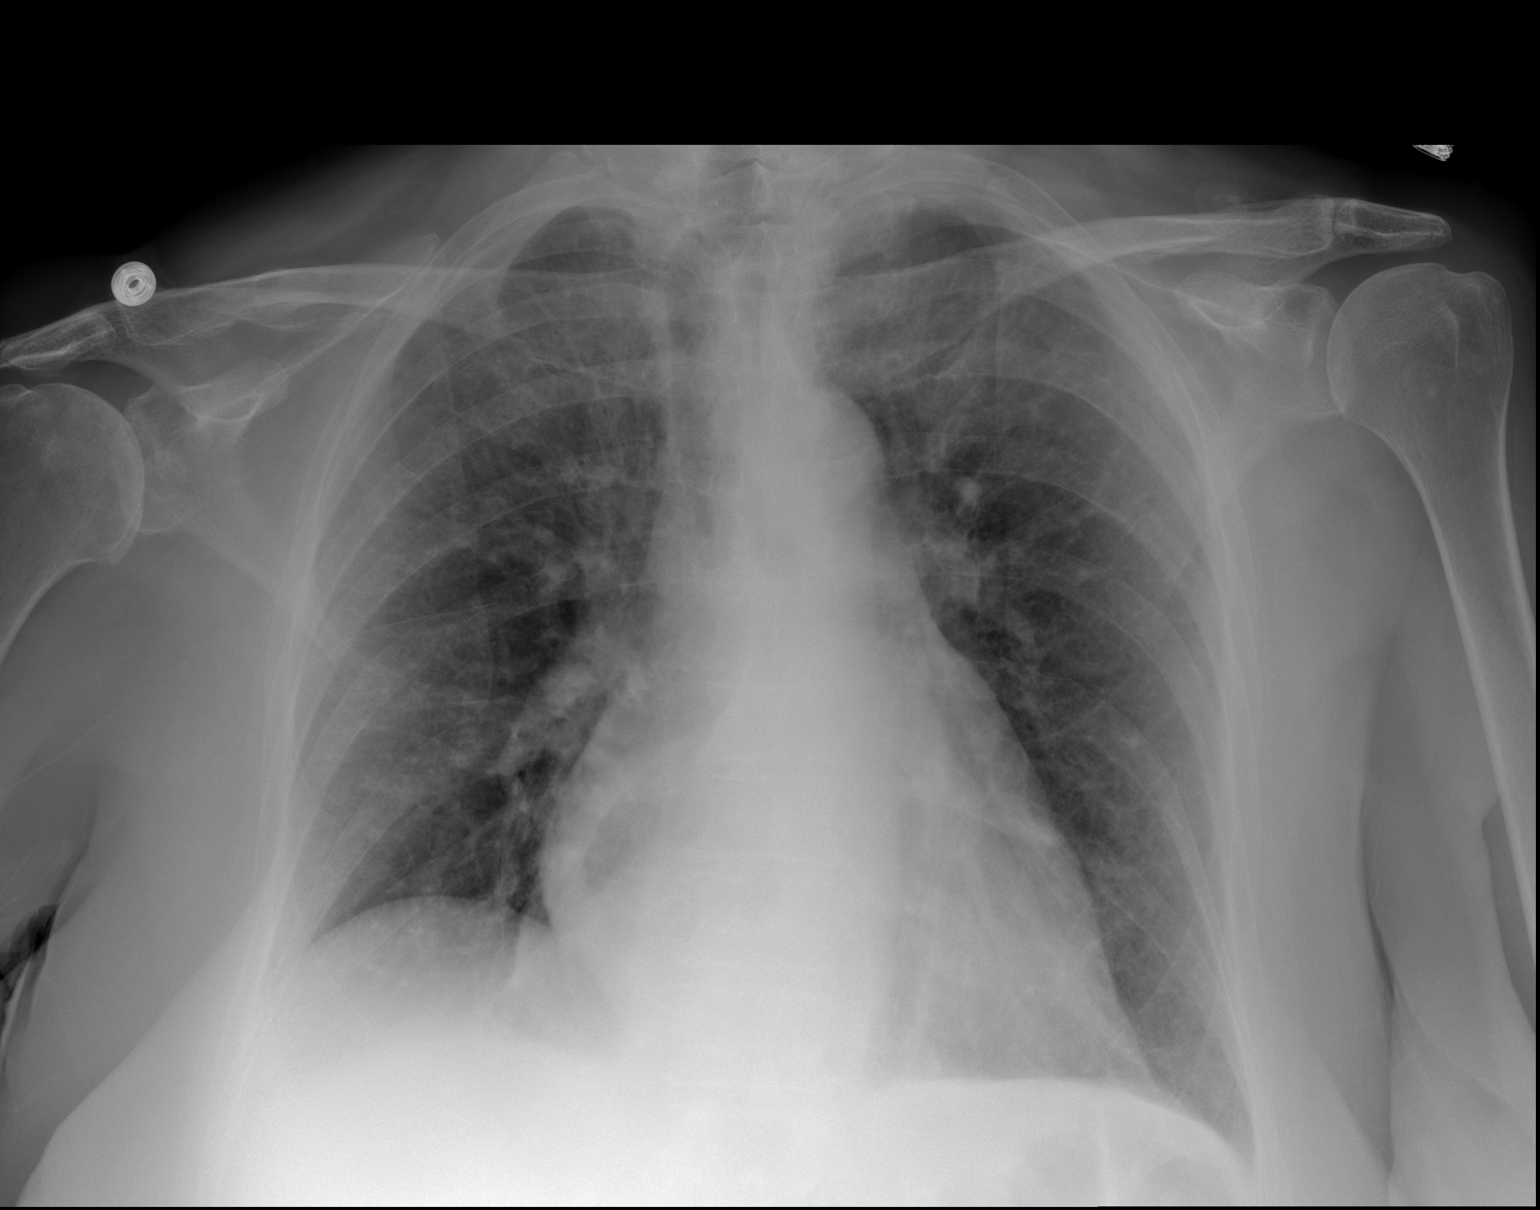

[w chest lat]
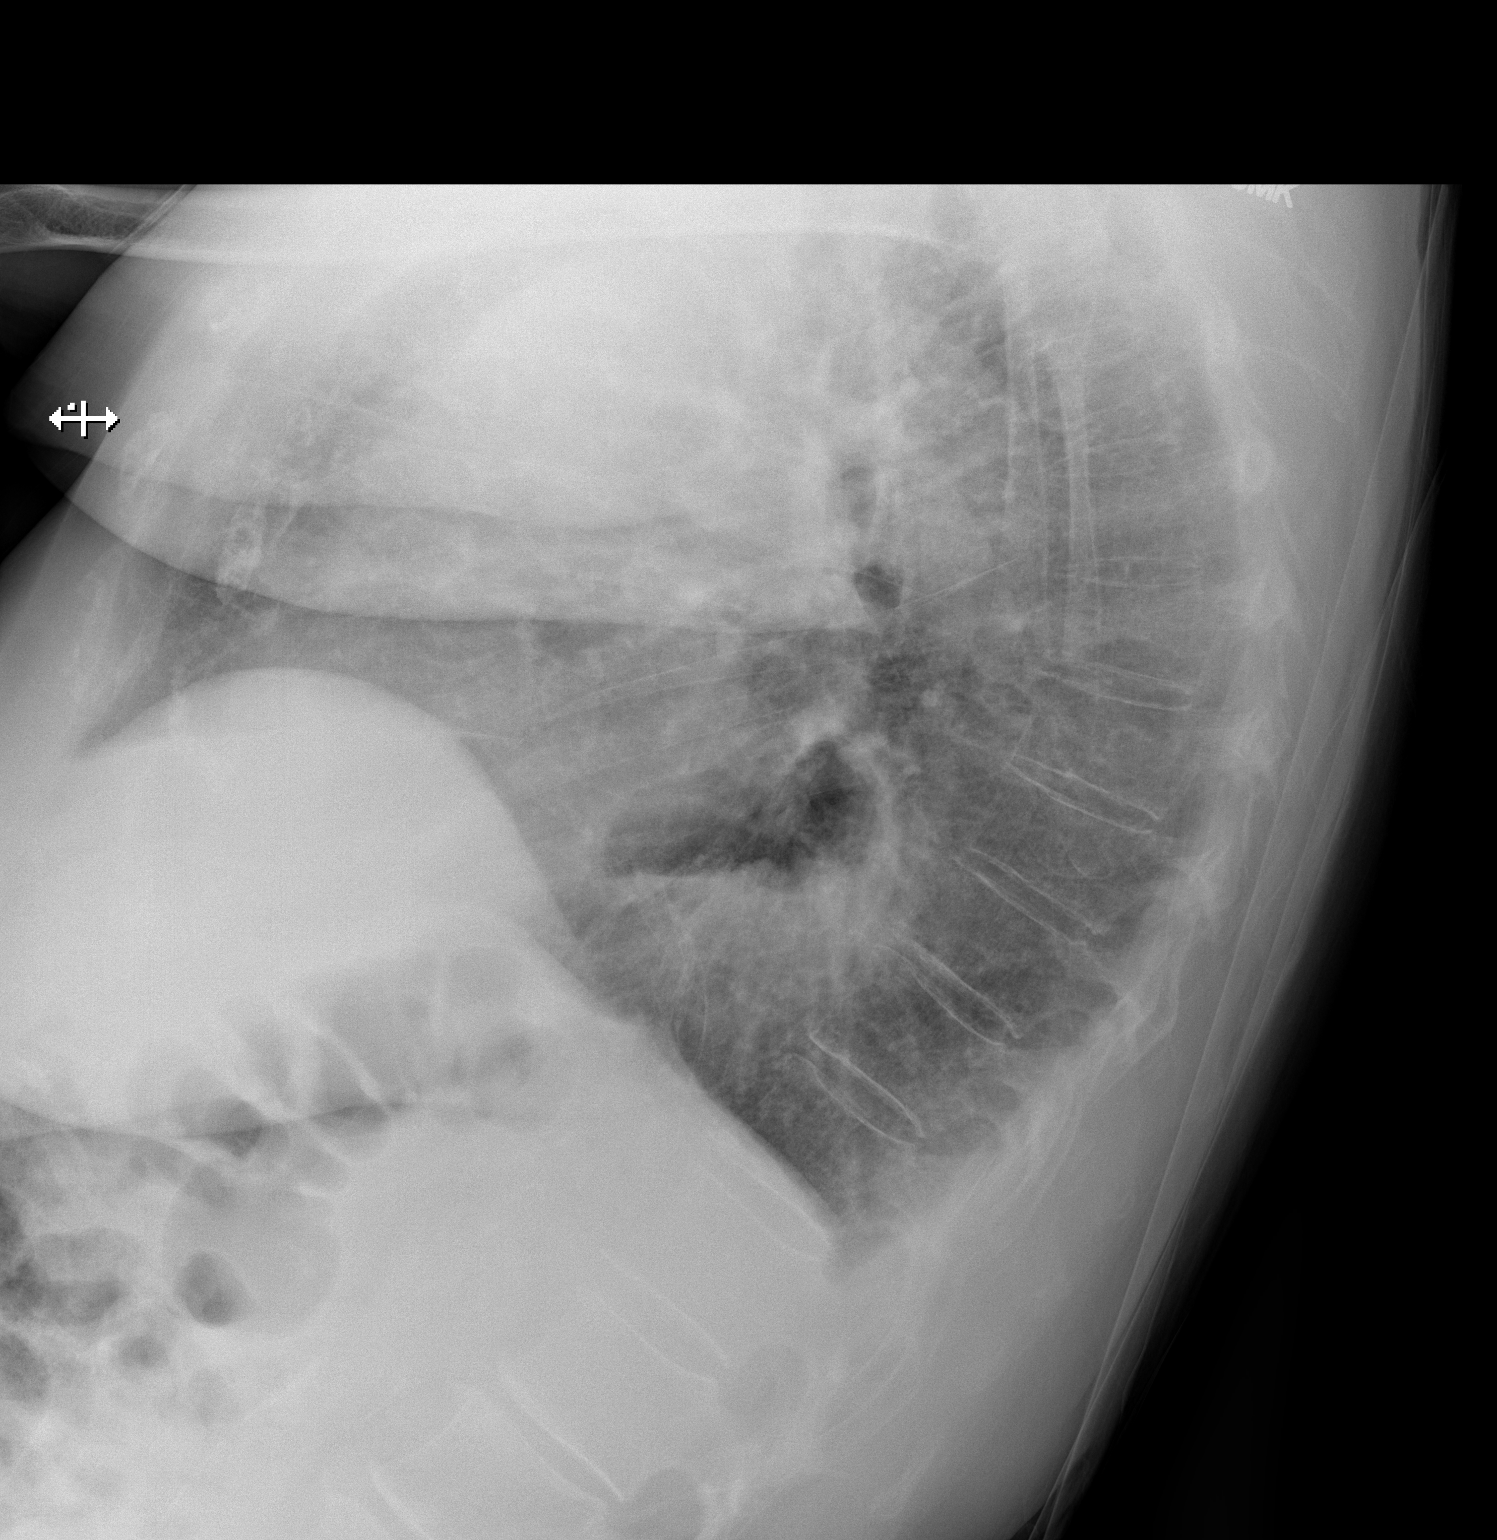

[2 of 2 positions shown; findings below may reference images not displayed]

FINDINGS: Chronic interstitial markings.  No focal consolidation.
No pleural effusion or pneumothorax.

Cardiomegaly.

Moderate hiatal hernia.

Mild degenerative changes of the visualized thoracolumbar spine.
IMPRESSION: No evidence of acute cardiopulmonary disease.

Cardiomegaly.

## 2012-08-07 MED ORDER — LORAZEPAM 2 MG/ML IJ SOLN
1.0000 mg | Freq: Once | INTRAMUSCULAR | Status: AC
  Administered 2012-08-07: 1 mg via INTRAVENOUS
  Filled 2012-08-07: qty 1

## 2012-08-07 MED ORDER — SODIUM CHLORIDE 0.9 % IV SOLN
INTRAVENOUS | Status: DC
  Administered 2012-08-07: 11:00:00 via INTRAVENOUS

## 2012-08-07 NOTE — ED Provider Notes (Signed)
History     CSN: [id number]  Arrival date & time 08/07/12  0828   First MD Initiated Contact with Patient 08/07/12 [phone]      Chief Complaint  Patient presents with  . Chest Pain  . Shortness of Breath    (Consider location/radiation/quality/duration/timing/severity/associated sxs/prior treatment) HPI Patient is here and she is hard to keep directed as to the point of her ED visit. She states she feels short of breath and feels like she has something at the back of her tong that is impeding her breathing and has been there for several months. She states she is able to swallow a deep without difficulty. She states she has not had any tests to evaluate this. She states she coughs but it's only to clear her throat to try to relieve the feeling of a blockage. She states it makes her wake up at night and makes her shake more.  Patient has been having shaking (tremor) for about the past year and a half. She actually just left her neurologist office 10 minutes ago to come to the ED for further evaluation. She states she had a MRI of the brain done 3 days ago and is waiting to get that result.  Patient also states she feels like when she stands up she is going to pass out. This seems to be a common complaint for her. She has had her medications adjusted for orthostatic hypotension by her cardiologist.  Patient had cardiac catheterization done about a year and a half ago which showed no major blockages although possible vasospasm of her distal LAD.  PCP Dr. Jillyn Hidden Cardiologist Dr. Eldridge Dace Neurologist [facility]  Past Medical History  Diagnosis Date  . Dizziness     chronic and of an unclear etiology  . Hypothyroidism   . HTN (hypertension)   . Hyperlipemia   . Obesity   . Osteoporosis   . Gastritis   . Vitamin D deficiency   . Atrial tachycardia     ablated 11/17/10  by JA  from the [facility] of the aorta  . Atrial flutter     typical appearing  . Atrial fibrillation     Past  Surgical History  Procedure Laterality Date  . Atrial ablation surgery  11/17/10    Atrial tachycardia arising from [facility] of the aorta ablated by JA    Family History  Problem Relation Age of Onset  . Hypertension      History  Substance Use Topics  . Smoking status: Never Smoker   . Smokeless tobacco: Not on file  . Alcohol Use: No   Lives alone for the past 4 years Has been using a cane or walker recently because of falling  OB History   Grav Para Term Preterm Abortions TAB SAB Ect Mult Living                  Review of Systems  All other systems reviewed and are negative.    Allergies  Iodinated diagnostic agents  Home Medications   Current Outpatient Rx  Name  Route  Sig  Dispense  Refill  . acetaminophen (TYLENOL) 325 MG tablet   Oral   Take 650 mg by mouth every 4 (four) hours as needed. For headache         . alendronate (FOSAMAX) 70 MG tablet   Oral   Take 70 mg by mouth every 30 (thirty) days. First of month on Saturday. Take with a full glass of water on  an empty stomach.         Marland Kitchen amiodarone (PACERONE) 200 MG tablet   Oral   Take 200 mg by mouth daily.          Marland Kitchen atenolol (TENORMIN) 50 MG tablet   Oral   Take 50 mg by mouth daily.           . Calcium Carbonate-Vitamin D (CALCIUM-VITAMIN D) 600-200 MG-UNIT CAPS   Oral   Take 1 capsule by mouth daily.          . Cholecalciferol (VITAMIN D3) 1000 UNITS CAPS   Oral   Take 1 capsule by mouth daily.         Marland Kitchen esomeprazole (NEXIUM) 40 MG capsule   Oral   Take 40 mg by mouth daily before breakfast.         . levothyroxine (SYNTHROID, LEVOTHROID) 88 MCG tablet   Oral   Take 88 mcg by mouth daily.         Marland Kitchen LORazepam (ATIVAN) 1 MG tablet   Oral   Take 1 tablet (1 mg total) by mouth 3 (three) times daily as needed for anxiety.   10 tablet   0   . warfarin (COUMADIN) 2.5 MG tablet   Oral   Take 2.5-5 mg by mouth daily. Take 5mg  on Thursday. Take 2.5mg  the rest of the week            BP 180/55  Pulse 58  Temp(Src) 97.8 F (36.6 C)  Resp 16  SpO2 100%  Vital signs normal except height per tension, bradycardia   Physical Exam  Nursing note and vitals reviewed. Constitutional: She is oriented to person, place, and time. She appears well-developed and well-nourished.  Non-toxic appearance. She does not appear ill. No distress.  HENT:  Head: Normocephalic and atraumatic.  Right Ear: External ear normal.  Left Ear: External ear normal.  Nose: Nose normal. No mucosal edema or rhinorrhea.  Mouth/Throat: Oropharynx is clear and moist and mucous membranes are normal. No dental abscesses or edematous.  Eyes: Conjunctivae and EOM are normal. Pupils are equal, round, and reactive to light.  Neck: Normal range of motion and full passive range of motion without pain. Neck supple.  Cardiovascular: Normal rate, regular rhythm and normal heart sounds.  Exam reveals no gallop and no friction rub.   No murmur heard. Pulmonary/Chest: Effort normal and breath sounds normal. No respiratory distress. She has no wheezes. She has no rhonchi. She has no rales. She exhibits no tenderness and no crepitus.  Abdominal: Soft. Normal appearance and bowel sounds are normal. She exhibits no distension. There is no tenderness. There is no rebound and no guarding.  Musculoskeletal: Normal range of motion. She exhibits no edema and no tenderness.  Moves all extremities well.   Neurological: She is alert and oriented to person, place, and time. She has normal strength. No cranial nerve deficit.  Pt has significant tremor of mouth, RUE>LUE  Skin: Skin is warm, dry and intact. No rash noted. No erythema. No pallor.  Psychiatric: She has a normal mood and affect. Her speech is normal and behavior is normal. Her mood appears not anxious.  Seems anxious with lots of sighing    ED Course  Procedures (including critical care time)  After reviewing her test results we talked and her son states  she has always been anxious and a worrier and she has been upset about changes in her medications and seeing multiple doctors. She is  agreeable to getting a psychiatry referral to help her deal with her anxiety.   Of note when her orthostatics were done, she had a persistent significant bradycardia. Reviewing Dr. Jenel Lucks recent cardiology notes show she had a heart rate in the 80s when she saw him in the office. She also had a significant drop in her blood pressure from lying to sitting and then it stabilized when she stood up. This may account for some of her feelings of being lightheaded and feeling she is going to pass out. I will talk to her cardiologist about her medications.   11:44 Baxter Hire, ACT has talked to patient and given list of psychiatrist to f/u with about her anxiety.  Pt discussed with Dr Johney Frame of [facility] (have not heard back from Dr Hoyle Barr goup in over 2 hrs). He suggests cutting her tenormin in half for her bradycardia and have her see Dr Eldridge Dace in the office in the next week.   Results for orders placed during the hospital encounter of 08/07/12  [facility]      Result Value Range   Prothrombin Time 25.4 (*) 11.6 - 15.2 seconds   INR 2.44 (*) 0.00 - 1.49  CBC WITH DIFFERENTIAL      Result Value Range   WBC 9.3  4.0 - 10.5 K/uL   RBC 4.60  3.87 - 5.11 MIL/uL   Hemoglobin 15.3 (*) 12.0 - 15.0 g/dL   HCT 16.1  09.6 - 04.5 %   MCV 92.8  78.0 - 100.0 fL   MCH 33.3  26.0 - 34.0 pg   MCHC 35.8  30.0 - 36.0 g/dL   RDW 40.9  81.1 - 91.4 %   Platelets 210  150 - 400 K/uL   Neutrophils Relative 73  43 - 77 %   Neutro Abs 6.8  1.7 - 7.7 K/uL   Lymphocytes Relative 16  12 - 46 %   Lymphs Abs 1.5  0.7 - 4.0 K/uL   Monocytes Relative 10  3 - 12 %   Monocytes Absolute 1.0  0.1 - 1.0 K/uL   Eosinophils Relative 1  0 - 5 %   Eosinophils Absolute 0.1  0.0 - 0.7 K/uL   Basophils Relative 0  0 - 1 %   Basophils Absolute 0.0  0.0 - 0.1 K/uL  COMPREHENSIVE METABOLIC  PANEL      Result Value Range   Sodium 141  135 - 145 mEq/L   Potassium 4.2  3.5 - 5.1 mEq/L   Chloride 103  96 - 112 mEq/L   CO2 25  19 - 32 mEq/L   Glucose, Bld 96  70 - 99 mg/dL   BUN 15  6 - 23 mg/dL   Creatinine, Ser 7.82  0.50 - 1.10 mg/dL   Calcium 95.6  8.4 - 21.3 mg/dL   Total Protein 7.5  6.0 - 8.3 g/dL   Albumin 3.7  3.5 - 5.2 g/dL   AST 28  0 - 37 U/L   ALT 11  0 - 35 U/L   Alkaline Phosphatase 129 (*) 39 - 117 U/L   Total Bilirubin 1.0  0.3 - 1.2 mg/dL   GFR calc non Af Amer 55 (*) >90 mL/min   GFR calc Af Amer 64 (*) >90 mL/min  POCT I-STAT TROPONIN I      Result Value Range   Troponin i, poc 0.00  0.00 - 0.08 ng/mL   Comment 3  POCT I-STAT, CHEM 8      Result Value Range   Sodium 143  135 - 145 mEq/L   Potassium 3.9  3.5 - 5.1 mEq/L   Chloride 107  96 - 112 mEq/L   BUN 17  6 - 23 mg/dL   Creatinine, Ser 9.14  0.50 - 1.10 mg/dL   Glucose, Bld 91  70 - 99 mg/dL   Calcium, Ion 7.82  9.56 - 1.30 mmol/L   TCO2 26  0 - 100 mmol/L   Hemoglobin 15.6 (*) 12.0 - 15.0 g/dL   HCT 21.3  08.6 - 57.8 %    Laboratory interpretation all normal except therapeutic INR     Dg Chest 2 View  08/07/2012  *RADIOLOGY REPORT*  Clinical Data: Difficulty swallowing, recent treatment for Parkinson's  CHEST - 2 VIEW  Comparison: 07/22/2012  Findings: Chronic interstitial markings.  No focal consolidation. No pleural effusion or pneumothorax.  Cardiomegaly.  Moderate hiatal hernia.  Mild degenerative changes of the visualized thoracolumbar spine.  IMPRESSION: No evidence of acute cardiopulmonary disease.  Cardiomegaly.   Original Report Authenticated By: Charline Bills, M.D.    Ct Soft Tissue Neck Wo Contrast  08/07/2012  *RADIOLOGY REPORT*  Clinical Data: Difficulty swallowing.  Treatment for Parkinson's disease was recently initiated.  Allergy to iodinated contrast  CT NECK WITHOUT CONTRAST  Technique:  Multidetector CT imaging of the neck was performed without intravenous  contrast.  Comparison: None.  Findings: No focal mucosal or submucosal lesions are present. Vocal cords are within normal limits and symmetric.  The trachea and esophagus are unremarkable.  The thyroid is within normal limits.  No significant cervical adenopathy is present.  Atherosclerotic calcifications are present bilaterally without evidence for significant stenosis by calcific plaque.  Mild dependent atelectasis is present at the lung apices bilaterally.  Moderate spondylosis of cervical spine is most evident the C4-5 and C5-6 with a broad-based disc osteophyte complex at both levels. Foraminal narrowing is worse on the left.  Degenerative anterolisthesis is present at C3-4.  IMPRESSION:  1.  No acute or focal lesion to explain the patient's symptoms. 2.  Mild dependent atelectasis at the lung apices bilaterally. 3.  Moderate spondylosis of the cervical spine.   Original Report Authenticated By: Marin Roberts, M.D.       Date: 08/07/2012  Rate: 58  Rhythm: sinus bradycardia  QRS Axis: normal  Intervals: normal  ST/T Wave abnormalities: normal  Conduction Disutrbances:none  Narrative Interpretation: artifact from her tremor present.  Old EKG Reviewed: none available    1. Globus hystericus   2. Bradycardia   3. Tremor   4. Anxiety     Plan discharge  Devoria Albe, MD, FACEP   MDM          Ward Givens, MD 08/07/12 [phone]

## 2012-08-07 NOTE — ED Notes (Signed)
ACT team member at bedside 

## 2012-08-07 NOTE — ED Notes (Signed)
Pt states that she has shaking in her arms that is normal for her. Pt recently began treatment for Parkinson.

## 2012-08-07 NOTE — ED Notes (Signed)
Pt is here with shortness of breath, mid chest tightness, difficulty swallowing since 0200.  Pt is anxious and trembling.

## 2012-08-07 NOTE — ED Notes (Signed)
Pt does not want chest xray until the dr sees her. Pt state that she has had multiple xrays recently.

## 2012-08-07 NOTE — ED Notes (Signed)
Assisted pt to the bathroom

## 2012-08-09 ENCOUNTER — Encounter: Payer: Self-pay | Admitting: Internal Medicine

## 2012-08-17 ENCOUNTER — Encounter: Payer: Self-pay | Admitting: Internal Medicine

## 2012-08-22 ENCOUNTER — Other Ambulatory Visit: Payer: Self-pay | Admitting: Emergency Medicine

## 2012-08-22 MED ORDER — AMIODARONE HCL 200 MG PO TABS: 200.0000 mg | ORAL_TABLET | Freq: Every day | ORAL | Status: DC

## 2012-09-14 ENCOUNTER — Encounter: Payer: Self-pay | Admitting: Neurology

## 2012-09-14 ENCOUNTER — Ambulatory Visit (INDEPENDENT_AMBULATORY_CARE_PROVIDER_SITE_OTHER): Payer: Medicare Other | Admitting: Neurology

## 2012-09-14 VITALS — BP 126/80 | HR 60 | Temp 97.6°F | Resp 20 | Wt 230.0 lb

## 2012-09-14 DIAGNOSIS — G250 Essential tremor: Secondary | ICD-10-CM

## 2012-09-14 DIAGNOSIS — R269 Unspecified abnormalities of gait and mobility: Secondary | ICD-10-CM

## 2012-09-14 DIAGNOSIS — G251 Drug-induced tremor: Secondary | ICD-10-CM

## 2012-09-14 NOTE — Progress Notes (Signed)
Charlotte Castro was seen today in the movement disorders clinic for neurologic consultation at the request of FULP, CAMMIE, MD.  The consultation is for the evaluation of tremor.  She has previously seen at [facility] for the same.  She was only seen there for one visit and the dx was probable PD.    The patient states that her first sx was 2 years ago and was tremor all over, but especially in the jaw.  She notes tremor in the R hand for the last several years but it has gotten a little better over the years.  She has mild tremor in the L hand.  She notes that the R hand tremor is most prominent at rest.    She has tried levodopa in the past but had no benefit from it and it was d/c.  She states that it caused intolerable dry mouth.    Specific Symptoms:  Tremor: yes Voice: no changes Sleep: trouble sleeping because of reflux pain in the middle of the night  Vivid Dreams:  no  Acting out dreams:  no Wet Pillows: no Postural symptoms:  yes  Falls?  yes, 2 episodes.  One of them was related to trying to get up in a truck and she felt "weak and dizz" and could not get up into the truck and fell.  Another episode, the L knee gave out and she fell on the concrete. Bradykinesia symptoms: difficulty getting out of a chair (relates to knee and back issues) Loss of smell:  no Loss of taste:  no Urinary Incontinence:  no Difficulty Swallowing:  yes (but more related to the CP from reflux) Handwriting, micrographia: no micrographia, but has tremor Trouble with ADL's:  no  Trouble buttoning clothing: no Depression:  no Memory changes:  no Hallucinations:  no  visual distortions: no N/V:  no Lightheaded:  yes  Syncope: no (says she "passes out" but states has never had LOC.  Has near syncope) Diplopia:  no Dyskinesia:  no  Neuroimaging has  previously been performed.  The CT brain available for my review today.  Her formal report is:  RADIOLOGY REPORT*  Clinical Data: Difficulty swallowing. Treatment  for Parkinson's  disease was recently initiated. Allergy to iodinated contrast  CT NECK WITHOUT CONTRAST  Technique: Multidetector CT imaging of the neck was performed  without intravenous contrast.  Comparison: None.  Findings: No focal mucosal or submucosal lesions are present.  Vocal cords are within normal limits and symmetric. The trachea  and esophagus are unremarkable. The thyroid is within normal  limits.  No significant cervical adenopathy is present. Atherosclerotic  calcifications are present bilaterally without evidence for  significant stenosis by calcific plaque. Mild dependent  atelectasis is present at the lung apices bilaterally.  Moderate spondylosis of cervical spine is most evident the C4-5 and  C5-6 with a broad-based disc osteophyte complex at both levels.  Foraminal narrowing is worse on the left. Degenerative  anterolisthesis is present at C3-4.  IMPRESSION:  1. No acute or focal lesion to explain the patient's symptoms.  2. Mild dependent atelectasis at the lung apices bilaterally.  3. Moderate spondylosis of the cervical spine.    PREVIOUS MEDICATIONS: Sinemet  ALLERGIES:   Allergies  Allergen Reactions  . Iodinated Diagnostic Agents Shortness Of Breath    headache    CURRENT MEDICATIONS:  Current Outpatient Prescriptions on File Prior to Visit  Medication Sig Dispense Refill  . acetaminophen (TYLENOL) 325 MG tablet Take 650 mg  by mouth every 4 (four) hours as needed. For headache      . alendronate (FOSAMAX) 70 MG tablet Take 70 mg by mouth every 30 (thirty) days. First of month on Saturday. Take with a full glass of water on an empty stomach.      Marland Kitchen amiodarone (PACERONE) 200 MG tablet Take 1 tablet (200 mg total) by mouth daily.  90 tablet  0  . atenolol (TENORMIN) 50 MG tablet Take 50 mg by mouth daily.        . Calcium Carbonate-Vitamin D (CALCIUM-VITAMIN D) 600-200 MG-UNIT CAPS Take 1 capsule by mouth daily.       . Cholecalciferol (VITAMIN D3)  1000 UNITS CAPS Take 1 capsule by mouth daily.      Marland Kitchen levothyroxine (SYNTHROID, LEVOTHROID) 88 MCG tablet Take 88 mcg by mouth daily.      Marland Kitchen warfarin (COUMADIN) 2.5 MG tablet Take 2.5-5 mg by mouth daily. Take 5mg  on Thursday. Take 2.5mg  the rest of the week       No current facility-administered medications on file prior to visit.    PAST MEDICAL HISTORY:   Past Medical History  Diagnosis Date  . Dizziness     chronic and of an unclear etiology  . Hypothyroidism   . HTN (hypertension)   . Hyperlipemia   . Obesity   . Osteoporosis   . Gastritis   . Vitamin D deficiency   . Atrial tachycardia     ablated 11/17/10  by JA  from the [facility] of the aorta  . Atrial flutter     typical appearing  . Atrial fibrillation     PAST SURGICAL HISTORY:   Past Surgical History  Procedure Laterality Date  . Atrial ablation surgery  11/17/10    Atrial tachycardia arising from [facility] of the aorta ablated by JA    SOCIAL HISTORY:   History   Social History  . Marital Status: Single    Spouse Name: N/A    Number of Children: N/A  . Years of Education: N/A   Occupational History  . Not on file.   Social History Main Topics  . Smoking status: Never Smoker   . Smokeless tobacco: Never Used  . Alcohol Use: No  . Drug Use: No  . Sexually Active: Not on file   Other Topics Concern  . Not on file   Social History Narrative  . No narrative on file    FAMILY HISTORY:   No family status information on file.    ROS:  Has CP over the "breast bone."  Being w/u for cardiac etiology.  She has changed to more bland diet and that has helped.  A complete 10 system review of systems was obtained and was unremarkable apart from what is mentioned above.  PHYSICAL EXAMINATION:    VITALS:   Filed Vitals:   09/14/12 0859  BP: 168/88  Pulse: 72  Temp: 97.6 F (36.4 C)  Resp: 20  Weight: 230 lb (104.327 kg)    GEN:  The patient appears stated age and is in NAD. HEENT:  Normocephalic, atraumatic.   The mucous membranes are moist. The superficial temporal arteries are without ropiness or tenderness. CV:  RRR Lungs:  CTAB Neck/HEME:  There are no carotid bruits bilaterally.  Neurological examination:  Orientation: The patient is alert and oriented x3. However, she is very tangential and its difficult to get her to stay on topic.  Fund of knowledge is appropriate.  Recent and remote  memory are intact.  Attention and concentration are normal.    Able to name objects and repeat phrases. Cranial nerves: There is good facial symmetry. Pupils are equal round and reactive to light bilaterally. Fundoscopic exam reveals clear margins bilaterally. Extraocular muscles are intact. There are no square wave jerks.  She has trouble participating with formal confrontation VF testing. The speech is fluent and clear. Soft palate rises symmetrically and there is no tongue deviation. Hearing is intact to conversational tone. Sensation: Sensation is intact to light and pinprick throughout (facial, trunk, extremities). Vibration is intact at the bilateral big toe. There is no extinction with double simultaneous stimulation. There is no sensory dermatomal level identified. Motor: Strength is 5/5 in the bilateral upper and lower extremities.   Shoulder shrug is equal and symmetric.  There is no pronator drift. Deep tendon reflexes: Deep tendon reflexes are 2+/4 at the bilateral biceps, triceps, brachioradialis, patella and 1/4 at the bilateral achilles. Plantar responses are downgoing bilaterally.  Movement examination: Tone: There is normal tone in the bilateral upper extremities.  The tone in the lower extremities is normal.  Abnormal movements: she has a resting tremor in the RUE and a significant jaw tremor with some tardive features. Coordination:  There is no decremation with RAM's, Including alternating supination and pronation of the forearm, hand opening and closing, finger taps, heel taps and toe taps. Gait  and Station: The patient will not try to get out of a deep-seated chair without the use of the hands. The patient's stride length is slightly decreased and her gait is tenuous.  She has an increase in the RUE resting tremor with ambulation.      ASSESSMENT/PLAN:  1.  Tremor.  -At least to some degree, I think that the amiodarone is contributing to the tremor.  She is not a good historian, but it appears that she has had some degree of tremor since the initiation of amiodarone years ago.  I told her that I suspect that she needs this for her heart, but she has an appointment tomorrow with her cardiologist, Dr. Johney Frame and she would like to talk to him about this.  -I do not think that she has idiopathic Parkinson's disease.  I think she does have some parkinsonian features, but she has no rigidity and only slight bradykinesia.  She has no facial hypomimia.  I do think that MSA-C (the former Cytogeneticist) is on the list of differential diagnoses given the fact that she complains of blood pressure fluctuations and excessive lightheadedness.  She had side effects with the levodopa and does not wish to retry that again.  Although I have not had dry mouth with this medication, which she reports was an intolerable side effects, it can drop blood pressure and cause further blood pressure fluctuations so she will stay off of this.   -I tried to do orthostatics on her but she refused to lay down, so they were done in the seated and standing position and she did have a significant blood pressure drop.  The pulse did not react significantly, but I suspect that is because she is on a beta blocker.  In a seated position her blood pressure was 160/70 and in the standing position is 126/80.  She refused compression stockings.  I worry about attempting to advance something like Florinef or midodrine because of side effects.  Droxidopa should be commercially available soon, but is currently not available, and again we will need  to worry about  potential side effects.  -Given balance issues, I think that the most important part would be for her to attend some therapy.  She refused.  -She is going to try to start the clonazepam at that Dr. Jillyn Hidden gave her.  I recommended that she stop all other forms of benzodiazepines (both Ativan and Xanax were on a list she brought).  I told her I would try to take half of the tablet first and take it at bedtime and see how she responds.  Risks, benefits, side effects and alternative therapies were discussed.  The opportunity to ask questions was given and they were answered to the best of my ability.  The patient expressed understanding and willingness to follow the outlined treatment protocols.  -I asked her to call me in one week and let me know how she is doing.  -if it does turn out that she has MSA, then the treatments are mainly supportive (therapies, controlling blood pressure fluctuations) and, at this point, she has refused those modalities.  -I will try to get a copy of her MRI of the brain.  I was able to review a CT of the brain, but she believes that she had an MRI of the brain done.  -I sent her cardiologist a note regarding her care.

## 2012-09-14 NOTE — Patient Instructions (Signed)
1.  Follow up tomorrow with Dr. Johney Frame 2.  I will send Dr. Johney Frame a note today 3.  Go ahead and try the klonopin Dr. Jillyn Hidden ordered but I would split the dose in half (1/2 tablet) 4.  Call me in a week

## 2012-09-15 ENCOUNTER — Ambulatory Visit (INDEPENDENT_AMBULATORY_CARE_PROVIDER_SITE_OTHER): Payer: Medicare Other | Admitting: Internal Medicine

## 2012-09-15 DIAGNOSIS — R001 Bradycardia, unspecified: Secondary | ICD-10-CM

## 2012-09-15 DIAGNOSIS — I4901 Ventricular fibrillation: Secondary | ICD-10-CM

## 2012-09-15 DIAGNOSIS — R42 Dizziness and giddiness: Secondary | ICD-10-CM

## 2012-09-15 DIAGNOSIS — I498 Other specified cardiac arrhythmias: Secondary | ICD-10-CM

## 2012-09-15 DIAGNOSIS — I4891 Unspecified atrial fibrillation: Secondary | ICD-10-CM

## 2012-09-15 MED ORDER — AMIODARONE HCL 200 MG PO TABS: 100.0000 mg | ORAL_TABLET | Freq: Every day | ORAL | Status: DC

## 2012-09-15 NOTE — Patient Instructions (Addendum)
Your physician recommends that you schedule a follow-up appointment in: 6 weeks with Dr Johney Frame  Your physician has recommended you make the following change in your medication:  1) Stop Atenolol 2) Decrease Amiodarone to 100 daily  Your physician recommends that you return for lab work in: Amiodarone level

## 2012-09-16 DIAGNOSIS — R001 Bradycardia, unspecified: Secondary | ICD-10-CM | POA: Insufficient documentation

## 2012-09-16 NOTE — Progress Notes (Signed)
The patient presents today for electrophysiology followup.  Since last being seen in our clinic, the patient reports doing reasonably well.  She continues to have occasional tachypalpitations, mostly at night.  She has developed significant resting tremor.  She also has frequent dizziness but appears to describe this is more of an unsteadiness.  She has fatigue and decreased exercise tolerance chronically.  Today, she denies symptoms of chest pain, shortness of breath, orthopnea, PND, lower extremity edema, presyncope, syncope, or neurologic sequela.  The patient feels that she is tolerating medications without difficulties and is otherwise without complaint today.   Past Medical History  Diagnosis Date  . Dizziness     chronic and of an unclear etiology  . Hypothyroidism   . HTN (hypertension)   . Hyperlipemia   . Obesity   . Osteoporosis   . Gastritis   . Vitamin D deficiency   . Atrial tachycardia     ablated 11/17/10  by JA  from the [facility] of the aorta  . Atrial flutter     typical appearing  . Atrial fibrillation   . GERD (gastroesophageal reflux disease)    Past Surgical History  Procedure Laterality Date  . Atrial ablation surgery  11/17/10    Atrial tachycardia arising from [facility] of the aorta ablated by JA    Current Outpatient Prescriptions  Medication Sig Dispense Refill  . acetaminophen (TYLENOL) 325 MG tablet Take 650 mg by mouth every 4 (four) hours as needed. For headache      . alendronate (FOSAMAX) 70 MG tablet Take 70 mg by mouth every 30 (thirty) days. First of month on Saturday. Take with a full glass of water on an empty stomach.      Marland Kitchen amiodarone (PACERONE) 200 MG tablet Take 0.5 tablets (100 mg total) by mouth daily.  90 tablet  0  . Calcium Carbonate-Vitamin D (CALCIUM-VITAMIN D) 600-200 MG-UNIT CAPS Take 1 capsule by mouth daily.       . Cholecalciferol (VITAMIN D3) 1000 UNITS CAPS Take 1 capsule by mouth daily.      . clonazePAM (KLONOPIN) 0.5 MG tablet Take 0.5 mg  by mouth 2 (two) times daily as needed for anxiety.      Marland Kitchen levothyroxine (SYNTHROID, LEVOTHROID) 88 MCG tablet Take 88 mcg by mouth daily.      . pantoprazole (PROTONIX) 40 MG tablet Take 40 mg by mouth daily.      . pravastatin (PRAVACHOL) 20 MG tablet Take 20 mg by mouth daily.      Marland Kitchen warfarin (COUMADIN) 2.5 MG tablet Take 2.5-5 mg by mouth daily. Take 5mg  on Thursday. Take 2.5mg  the rest of the week       No current facility-administered medications for this visit.    Allergies  Allergen Reactions  . Iodinated Diagnostic Agents Shortness Of Breath    headache    History   Social History  . Marital Status: Single    Spouse Name: N/A    Number of Children: N/A  . Years of Education: N/A   Occupational History  . Not on file.   Social History Main Topics  . Smoking status: Never Smoker   . Smokeless tobacco: Never Used  . Alcohol Use: No  . Drug Use: No  . Sexually Active: Not on file   Other Topics Concern  . Not on file   Social History Narrative  . No narrative on file    Family History  Problem Relation Age of Onset  .  Hypertension     Physical Exam:  GEN- The patient is anxious and elderly appearing, alert and oriented x 3 today.   Head- normocephalic, atraumatic Eyes-  Sclera clear, conjunctiva pink Ears- hearing intact Oropharynx- clear Neck- supple, no JVP Lymph- no cervical lymphadenopathy Lungs- Clear to ausculation bilaterally, normal work of breathing Heart- irrgular rate and rhythm, no murmurs, rubs or gallops, PMI not laterally displaced GI- soft, NT, ND, + BS Extremities- no clubbing, cyanosis, or edema Neuro- resting tremor most noticeable in the face/ neck  ekg today reveals sinus rhythm 52 bpm, PR 170, QTc 465, otherwise normal ekg Event monitor from 08/17/12-09/13/12 is reviewed in detial today  Assessment and Plan:  1. Bradycardia The patient has difficulty describing her symptoms fully.  Her dizziness appears to be more of unsteadiness  than anything else.  She does have documented bradycardia on her recent event monitor though she was not profoundly bradycardic and did not have long pauses. At this point, I will stop atenolol and follow. If she continues to have bradycardia with symptoms then we may eventually consider PPM implantation.  Though for now we will try to avoid this.  2. afib Continue coumadin I am concerned that some of her symptoms could be from amiodarone, particularly her tremor. Stop atenolol first (as above).  Consider decreasing amiodarone upon return. Check amiodarone level today. Decrease amiodarone to 100mg  daily  3. Dizziness/ tremor As above Check amiodarone level Decrease amiodarone  Return in 6 weeks

## 2012-09-18 ENCOUNTER — Ambulatory Visit: Payer: Medicare Other | Admitting: Internal Medicine

## 2012-09-22 LAB — AMIODARONE LEVEL: Amiodarone Lvl: 1 ug/mL — ABNORMAL LOW (ref 1.5–2.5)

## 2012-10-02 ENCOUNTER — Encounter: Payer: Self-pay | Admitting: *Deleted

## 2012-10-11 ENCOUNTER — Encounter: Payer: Self-pay | Admitting: Neurology

## 2012-10-11 ENCOUNTER — Ambulatory Visit (INDEPENDENT_AMBULATORY_CARE_PROVIDER_SITE_OTHER): Payer: Medicare Other | Admitting: Neurology

## 2012-10-11 VITALS — BP 140/78 | HR 80 | Temp 97.7°F | Resp 20 | Wt 233.0 lb

## 2012-10-11 DIAGNOSIS — G251 Drug-induced tremor: Secondary | ICD-10-CM

## 2012-10-11 DIAGNOSIS — G252 Other specified forms of tremor: Secondary | ICD-10-CM

## 2012-10-11 MED ORDER — TRIHEXYPHENIDYL HCL 2 MG PO TABS: 2.0000 mg | ORAL_TABLET | Freq: Two times a day (BID) | ORAL | Status: DC

## 2012-10-11 NOTE — Progress Notes (Signed)
Charlotte Castro was seen today in the movement disorders clinic for neurologic consultation at the request of FULP, CAMMIE, MD.  The consultation is for the evaluation of tremor.  She has previously seen at [facility] for the same.  She was only seen there for one visit and the dx was probable PD.    The patient states that her first sx was 2 years ago and was tremor all over, but especially in the jaw.  She notes tremor in the R hand for the last several years but it has gotten a little better over the years.  She has mild tremor in the L hand.  She notes that the R hand tremor is most prominent at rest.    She has tried levodopa in the past but had no benefit from it and it was d/c.  She states that it caused intolerable dry mouth.  Last visit, I discussed with her that her sx's were likely, in part, from the amiodarone.  She did see the cardiologist and that medication has been decreased but she is still on it.  The cardiologist was first attempting to get her off of the atenolol because of bradycardia.  He felt about potentially was causing near syncope.  Since being off of the atenolol, she notices that tremor is increased.  Her blood pressure has also been increased.  She continues to have the near syncope and has a followup with cardiology.  She remains on 100 mg amiodarone.  She c/o balance problems today and last visit, but refuses therapy.  She did start the clonazepam 0.5 mg, half tablet twice a day but did not find this helpful.  Specific Symptoms:  Tremor: yes Voice: no changes Sleep: trouble sleeping because of reflux pain in the middle of the night  Vivid Dreams:  no  Acting out dreams:  no Wet Pillows: no Postural symptoms:  yes  Falls?  yes, 2 episodes.  One of them was related to trying to get up in a truck and she felt "weak and dizz" and could not get up into the truck and fell.  Another episode, the L knee gave out and she fell on the concrete. Bradykinesia symptoms: difficulty getting  out of a chair (relates to knee and back issues) Loss of smell:  no Loss of taste:  no Urinary Incontinence:  no Difficulty Swallowing:  yes (but more related to the CP from reflux) Handwriting, micrographia: no micrographia, but has tremor Trouble with ADL's:  no  Trouble buttoning clothing: no Depression:  no Memory changes:  no Hallucinations:  no  visual distortions: no N/V:  no Lightheaded:  yes  Syncope: no (says she "passes out" but states has never had LOC.  Has near syncope) Diplopia:  no Dyskinesia:  no  Neuroimaging has  previously been performed.  The CT brain available for my review today.  Her formal report is:  RADIOLOGY REPORT*  Clinical Data: Difficulty swallowing. Treatment for Parkinson's  disease was recently initiated. Allergy to iodinated contrast  CT NECK WITHOUT CONTRAST  Technique: Multidetector CT imaging of the neck was performed  without intravenous contrast.  Comparison: None.  Findings: No focal mucosal or submucosal lesions are present.  Vocal cords are within normal limits and symmetric. The trachea  and esophagus are unremarkable. The thyroid is within normal  limits.  No significant cervical adenopathy is present. Atherosclerotic  calcifications are present bilaterally without evidence for  significant stenosis by calcific plaque. Mild dependent  atelectasis is  present at the lung apices bilaterally.  Moderate spondylosis of cervical spine is most evident the C4-5 and  C5-6 with a broad-based disc osteophyte complex at both levels.  Foraminal narrowing is worse on the left. Degenerative  anterolisthesis is present at C3-4.  IMPRESSION:  1. No acute or focal lesion to explain the patient's symptoms.  2. Mild dependent atelectasis at the lung apices bilaterally.  3. Moderate spondylosis of the cervical spine.    PREVIOUS MEDICATIONS: Sinemet  ALLERGIES:   Allergies  Allergen Reactions  . Iodinated Diagnostic Agents Shortness Of Breath     headache    CURRENT MEDICATIONS:  Current Outpatient Prescriptions on File Prior to Visit  Medication Sig Dispense Refill  . acetaminophen (TYLENOL) 325 MG tablet Take 650 mg by mouth every 4 (four) hours as needed. For headache      . alendronate (FOSAMAX) 70 MG tablet Take 70 mg by mouth every 30 (thirty) days. First of month on Saturday. Take with a full glass of water on an empty stomach.      Marland Kitchen amiodarone (PACERONE) 200 MG tablet Take 0.5 tablets (100 mg total) by mouth daily.  90 tablet  0  . Calcium Carbonate-Vitamin D (CALCIUM-VITAMIN D) 600-200 MG-UNIT CAPS Take 1 capsule by mouth daily.       . Cholecalciferol (VITAMIN D3) 1000 UNITS CAPS Take 1 capsule by mouth daily.      . clonazePAM (KLONOPIN) 0.5 MG tablet Take 0.5 mg by mouth 2 (two) times daily as needed for anxiety.      Marland Kitchen levothyroxine (SYNTHROID, LEVOTHROID) 88 MCG tablet Take 88 mcg by mouth daily.      . pantoprazole (PROTONIX) 40 MG tablet Take 40 mg by mouth daily.      . pravastatin (PRAVACHOL) 20 MG tablet Take 20 mg by mouth daily.      Marland Kitchen warfarin (COUMADIN) 2.5 MG tablet Take 2.5-5 mg by mouth daily. Take 5mg  on Thursday. Take 2.5mg  the rest of the week       No current facility-administered medications on file prior to visit.    PAST MEDICAL HISTORY:   Past Medical History  Diagnosis Date  . Dizziness     chronic and of an unclear etiology  . Hypothyroidism   . HTN (hypertension)   . Hyperlipemia   . Obesity   . Osteoporosis   . Gastritis   . Vitamin D deficiency   . Atrial tachycardia     ablated 11/17/10  by JA  from the [facility] of the aorta  . Atrial flutter     typical appearing  . Atrial fibrillation   . GERD (gastroesophageal reflux disease)     PAST SURGICAL HISTORY:   Past Surgical History  Procedure Laterality Date  . Atrial ablation surgery  11/17/10    Atrial tachycardia arising from [facility] of the aorta ablated by JA    SOCIAL HISTORY:   History   Social History  . Marital Status:  Single    Spouse Name: N/A    Number of Children: N/A  . Years of Education: N/A   Occupational History  . Not on file.   Social History Main Topics  . Smoking status: Never Smoker   . Smokeless tobacco: Never Used  . Alcohol Use: No  . Drug Use: No  . Sexually Active: Not on file   Other Topics Concern  . Not on file   Social History Narrative  . No narrative on file    FAMILY  HISTORY:   Family Status  Relation Status Death Age  . Mother Deceased     unknown cause of death  . Father Deceased     CVA  . Child Alive     2, alive and well    ROS:  Has CP over the "breast bone."  Being w/u for cardiac etiology.  She has changed to more bland diet and that has helped.  A complete 10 system review of systems was obtained and was unremarkable apart from what is mentioned above.  PHYSICAL EXAMINATION:    VITALS:   There were no vitals filed for this visit.  GEN:  The patient appears stated age and is in NAD. HEENT:  Normocephalic, atraumatic.  The mucous membranes are moist. The superficial temporal arteries are without ropiness or tenderness. CV:  RRR Lungs:  CTAB Neck/HEME:  There are no carotid bruits bilaterally.  Neurological examination:  Orientation: The patient is alert and oriented x3. However, she is very tangential and its difficult to get her to stay on topic.  Fund of knowledge is appropriate.  Recent and remote memory are intact.  Attention and concentration are normal.    Able to name objects and repeat phrases. Cranial nerves: There is good facial symmetry. Pupils are equal round and reactive to light bilaterally. Fundoscopic exam reveals clear margins bilaterally. Extraocular muscles are intact. There are no square wave jerks.  She has trouble participating with formal confrontation VF testing. The speech is fluent and clear. Soft palate rises symmetrically and there is no tongue deviation. Hearing is intact to conversational tone. Sensation: Sensation is  intact to light and pinprick throughout (facial, trunk, extremities). Vibration is intact at the bilateral big toe. There is no extinction with double simultaneous stimulation. There is no sensory dermatomal level identified. Motor: Strength is 5/5 in the bilateral upper and lower extremities.   Shoulder shrug is equal and symmetric.  There is no pronator drift. Deep tendon reflexes: Deep tendon reflexes are 2+/4 at the bilateral biceps, triceps, brachioradialis, patella and 1/4 at the bilateral achilles. Plantar responses are downgoing bilaterally.  Movement examination: Tone: There is ? Mild rigidity in the RUE. There is none on the L.  The tone in the lower extremities is normal.  Abnormal movements: she has a resting tremor in the RUE and a significant jaw tremor with some tardive features. Coordination:  There is no decremation with RAM's, Including alternating supination and pronation of the forearm, hand opening and closing, finger taps, heel taps and toe taps. Gait and Station: The patient will not try to get out of a deep-seated chair without the use of the hands. The patient's stride length is slightly decreased and her gait is tenuous.  She has an increase in the RUE resting tremor with ambulation.      ASSESSMENT/PLAN:  1.  Tremor.  -At least to some degree, I think that the amiodarone is contributing to the tremor.  She is not a good historian, but it appears that she has had some degree of tremor since the initiation of amiodarone years ago.  I told her that I suspect that she needs this for her heart, but she has an appointment tomorrow with her cardiologist, Dr. Johney Frame and she would like to talk to him about this.  -I do not think that she has idiopathic Parkinson's disease.  I think she does have some parkinsonian features, but she has no rigidity and only slight bradykinesia.  She has no facial hypomimia.  I do think that MSA-C (the former Cytogeneticist) is on the list of differential  diagnoses given the fact that she complains of blood pressure fluctuations and excessive lightheadedness.  She had side effects with the levodopa and does not wish to retry that again.  Although I have not had dry mouth with this medication, which she reports was an intolerable side effects, it can drop blood pressure and cause further blood pressure fluctuations so she will stay off of this.   -I tried to do orthostatics on her last visit but she refused to lay down, so they were done in the seated and standing position and she did have a significant blood pressure drop.  The pulse did not react significantly, but I suspect that was because she was on a beta blocker (now off).  In a seated position her blood pressure was 160/70 and in the standing position is 126/80.  She refused compression stockings.  I worry about attempting to advance something like Florinef or midodrine because of side effects.  Droxidopa should be commercially available soon, but is currently not available, and again we will need to worry about potential side effects.  -Given balance issues, I think that the most important part would be for her to attend some therapy.  She refused again today.  -I. am going to try Artane.  I warned her about the potential side effect of dry mouth.  She complained about intolerable dry mouth with levodopa (a side effect I have not had in the past) but it is certainly common with Artane.  Nonetheless, this is a pure tremor drug and may be more efficacious than something like Mysoline.  Risks, benefits, side effects and alternative therapies were discussed.  The opportunity to ask questions was given and they were answered to the best of my ability.  The patient expressed understanding and willingness to follow the outlined treatment protocols.  -I asked her to call me in 2 weeks and let me know how she is doing.  -if it does turn out that she has MSA, then the treatments are mainly supportive (therapies,  controlling blood pressure fluctuations) and, at this point, she has refused those modalities.  She has also refused levodopa therapy.  -She made mention multiple times of trying to find new physicians at [facility].  I told her that I would be happy to refer her to neurology at [facility] if she would like, but she wants to hold to see how she does with the Artane.

## 2012-10-11 NOTE — Patient Instructions (Addendum)
1.  Start trihexyphenidyl - 2mg  - 1 tablet once per day in evening for a week and then increase to twice per day 2.  I want you to call me in 3-4 weeks and let me know how you are doing 3.  We will consider referral to [facility] if you would like if you are unhappy with the results.

## 2012-10-23 ENCOUNTER — Encounter: Payer: Self-pay | Admitting: Internal Medicine

## 2012-10-23 ENCOUNTER — Ambulatory Visit (INDEPENDENT_AMBULATORY_CARE_PROVIDER_SITE_OTHER): Payer: Medicare Other | Admitting: Internal Medicine

## 2012-10-23 VITALS — BP 142/80 | HR 85 | Ht 68.0 in | Wt 227.0 lb

## 2012-10-23 DIAGNOSIS — I498 Other specified cardiac arrhythmias: Secondary | ICD-10-CM

## 2012-10-23 DIAGNOSIS — R42 Dizziness and giddiness: Secondary | ICD-10-CM

## 2012-10-23 DIAGNOSIS — I4892 Unspecified atrial flutter: Secondary | ICD-10-CM

## 2012-10-23 DIAGNOSIS — I4891 Unspecified atrial fibrillation: Secondary | ICD-10-CM

## 2012-10-23 DIAGNOSIS — R001 Bradycardia, unspecified: Secondary | ICD-10-CM

## 2012-10-23 NOTE — Patient Instructions (Addendum)
Stop Amiodarone Stop Pravachol  Follow up in 6 weeks with Dr Johney Frame.

## 2012-10-29 NOTE — Progress Notes (Signed)
PCP: Cain Saupe, MD Primary Cardiologist:  Dr Eldridge Dace  The patient presents today for electrophysiology followup.  She continues to have decline.  Her tremor is worse.  She has ongoing evaluation by neurology.  In addition, she reports having frequent "passing out spells".  However, when I question further, she has had no syncope or presyncope.  She has unsteadiness and fatigue.  She has difficulty articulating symptoms.  Her daughter is with her today.  The family states that she has not had presyncope or syncope.  They reports that her appetite has been very poor and are concerned about her poor intake.  They feel that she has dehydration from time to time with this and postural dizziness.  They notice frequent unsteadiness.  She has fatigue and decreased exercise tolerance chronically.  She does not appear to have symptoms of arrhythmia presently.  The family notices that he states that she is SOB and appears to gasp for air frequently.  They feel that anxiety is partially responsible for this.  Today, she denies symptoms of chest pain, orthopnea, PND, lower extremity edema, presyncope, syncope, or neurologic sequela.  The patient feels that she is tolerating medications without difficulties and is otherwise without complaint today.   Past Medical History  Diagnosis Date  . Dizziness     chronic and of an unclear etiology  . Hypothyroidism   . HTN (hypertension)   . Hyperlipemia   . Obesity   . Osteoporosis   . Gastritis   . Vitamin D deficiency   . Atrial tachycardia     ablated 11/17/10  by JA  from the [facility] of the aorta  . Atrial flutter     typical appearing  . Atrial fibrillation   . GERD (gastroesophageal reflux disease)    Past Surgical History  Procedure Laterality Date  . Atrial ablation surgery  11/17/10    Atrial tachycardia arising from [facility] of the aorta ablated by JA    Current Outpatient Prescriptions  Medication Sig Dispense Refill  . acetaminophen (TYLENOL) 325 MG  tablet Take 650 mg by mouth every 4 (four) hours as needed. For headache      . alendronate (FOSAMAX) 70 MG tablet Take 70 mg by mouth every 30 (thirty) days. First of month on Saturday. Take with a full glass of water on an empty stomach.      . Calcium Carbonate-Vitamin D (CALCIUM-VITAMIN D) 600-200 MG-UNIT CAPS Take 1 capsule by mouth daily.       . Cholecalciferol (VITAMIN D3) 1000 UNITS CAPS Take 1 capsule by mouth daily.      . clonazePAM (KLONOPIN) 0.5 MG tablet Take 0.5 mg by mouth 2 (two) times daily as needed for anxiety.      Marland Kitchen levothyroxine (SYNTHROID, LEVOTHROID) 88 MCG tablet Take 88 mcg by mouth daily.      . pantoprazole (PROTONIX) 40 MG tablet Take 40 mg by mouth daily.      . trihexyphenidyl (ARTANE) 2 MG tablet Take 1 tablet (2 mg total) by mouth 2 (two) times daily with a meal.  60 tablet  3  . warfarin (COUMADIN) 2.5 MG tablet Take 2.5-5 mg by mouth daily. Take 5mg  on Thursday. Take 2.5mg  the rest of the week       No current facility-administered medications for this visit.    Allergies  Allergen Reactions  . Iodinated Diagnostic Agents Shortness Of Breath    headache    History   Social History  . Marital Status: Single  Spouse Name: N/A    Number of Children: N/A  . Years of Education: N/A   Occupational History  . Not on file.   Social History Main Topics  . Smoking status: Never Smoker   . Smokeless tobacco: Never Used  . Alcohol Use: No  . Drug Use: No  . Sexually Active: Not on file   Other Topics Concern  . Not on file   Social History Narrative  . No narrative on file    Family History  Problem Relation Age of Onset  . Hypertension     Physical Exam:  GEN- The patient is anxious and elderly appearing, alert and oriented x 3 today.   Head- normocephalic, atraumatic Eyes-  Sclera clear, conjunctiva pink Ears- hearing intact Oropharynx- clear Neck- supple, no JVP Lymph- no cervical lymphadenopathy Lungs- Clear to ausculation  bilaterally, normal work of breathing Heart- regular rate and rhythm, no murmurs, rubs or gallops, PMI not laterally displaced GI- soft, NT, ND, + BS Extremities- no clubbing, cyanosis, or edema Neuro- resting tremor most noticeable in the face/ neck  ekg today reveals sinus rhythm 83 bpm,  otherwise normal ekg  Assessment and Plan:  1. Bradycardia Improved No indication for pacing  2. afib Continue coumadin I am concerned that some of her symptoms could be from amiodarone, particularly her tremor.  I will therefore stop amiodarone at this time and follow for arrhythmia recurrence.  3. Dizziness/ tremor As above Stop amiodarone. As her ekg, BP and HR are normal and she reports having "extreme" symptoms in my office today for which she states that she "cant stand it", I think that a cardiac cause for her symptoms is highly unlikely.  She is clearly quite anxious. She is concerned that her symptoms may be due to medication.  I will stop amiodarone (As above) and also stop pravachol at this time. She needs to follow closely with primary care and neurology.  I dont think that further EP testing is required.

## 2012-10-30 ENCOUNTER — Ambulatory Visit: Payer: Medicare Other | Admitting: Internal Medicine

## 2012-11-28 ENCOUNTER — Other Ambulatory Visit: Payer: Self-pay | Admitting: Family Medicine

## 2012-11-28 DIAGNOSIS — R42 Dizziness and giddiness: Secondary | ICD-10-CM

## 2012-12-04 ENCOUNTER — Ambulatory Visit
Admission: RE | Admit: 2012-12-04 | Discharge: 2012-12-04 | Disposition: A | Discharge status: Home / Self Care | Payer: Medicare Other | Source: Ambulatory Visit | Attending: Family Medicine | Admitting: Family Medicine

## 2012-12-04 DIAGNOSIS — R42 Dizziness and giddiness: Secondary | ICD-10-CM

## 2012-12-04 IMAGING — US US CAROTID DUPLEX BILAT
1 series · 13 of 24 positions shown · non-contrast
Comparison: None.

CLINICAL DATA: Dizziness, hypertension, unsteady gait

BILATERAL CAROTID DUPLEX ULTRASOUND
TECHNIQUE: Gray scale imaging, color Doppler and duplex ultrasound
was performed of bilateral carotid and vertebral arteries in the
neck.

[Series 1: us carotid duplex bilat · 0.06mm/px · 13 of 62 slices shown]
[im 1/62]
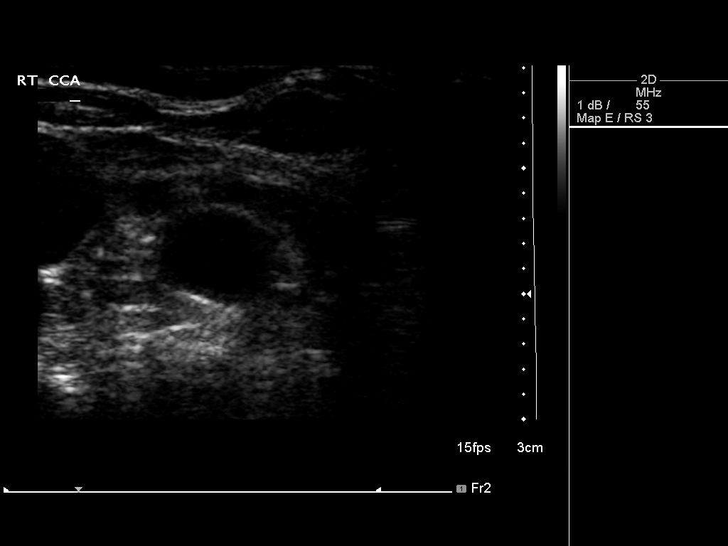
[im 6/62]
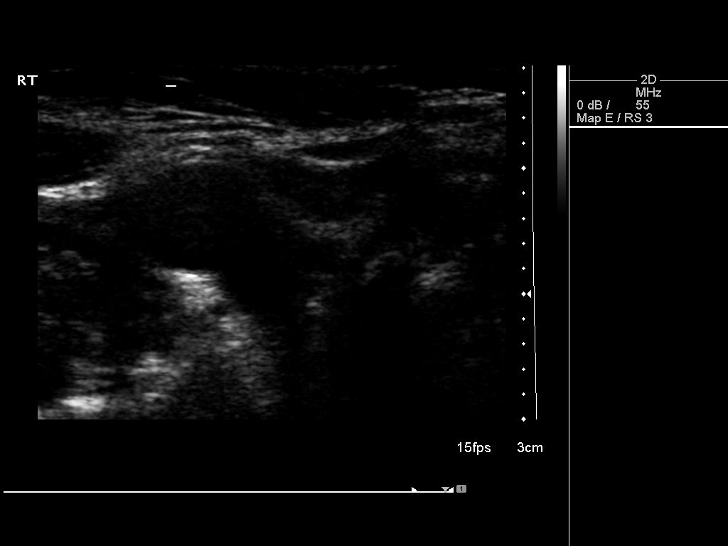
[im 11/62]
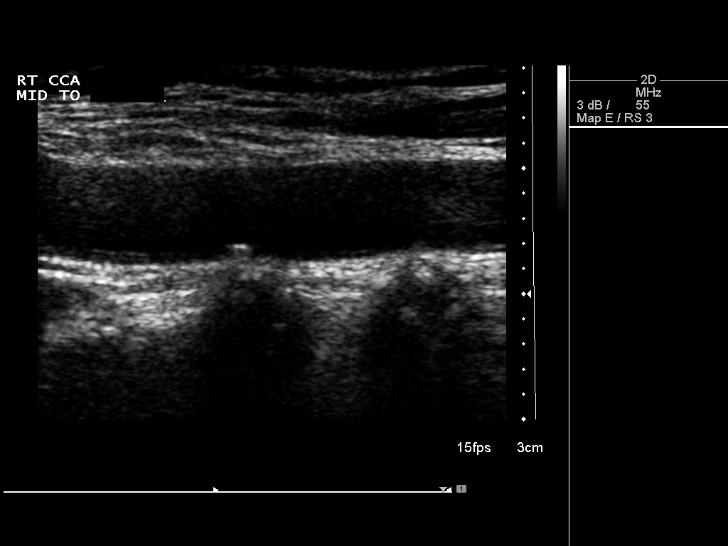
[im 16/62]
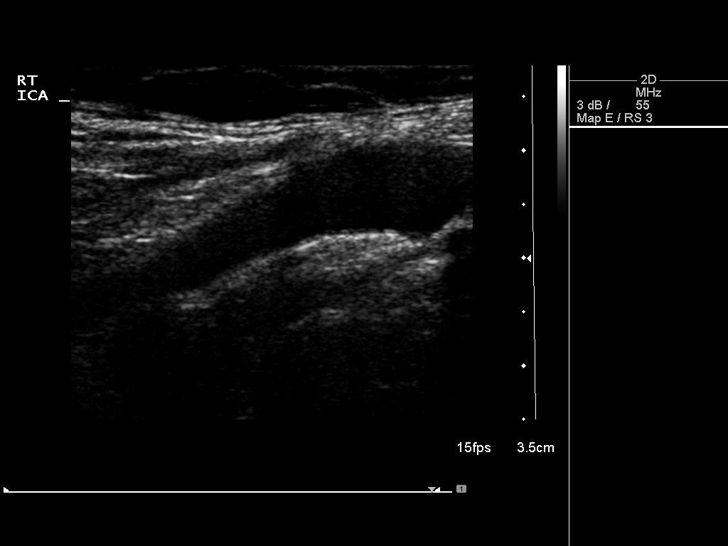
[im 22/62]
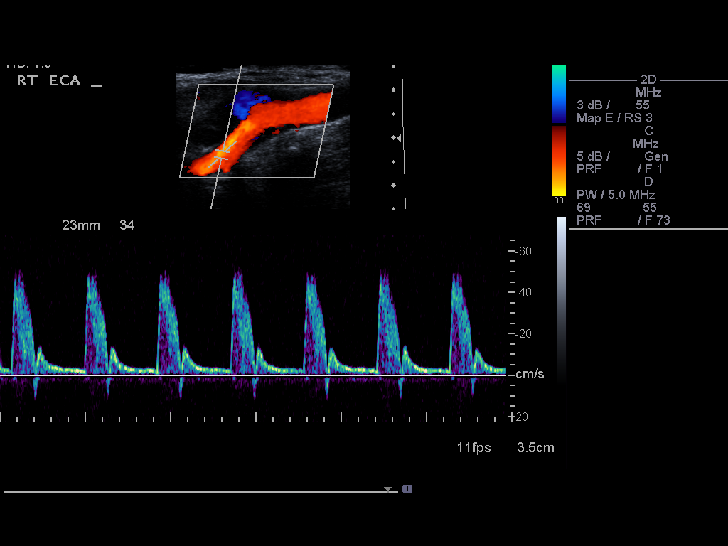
[im 27/62]
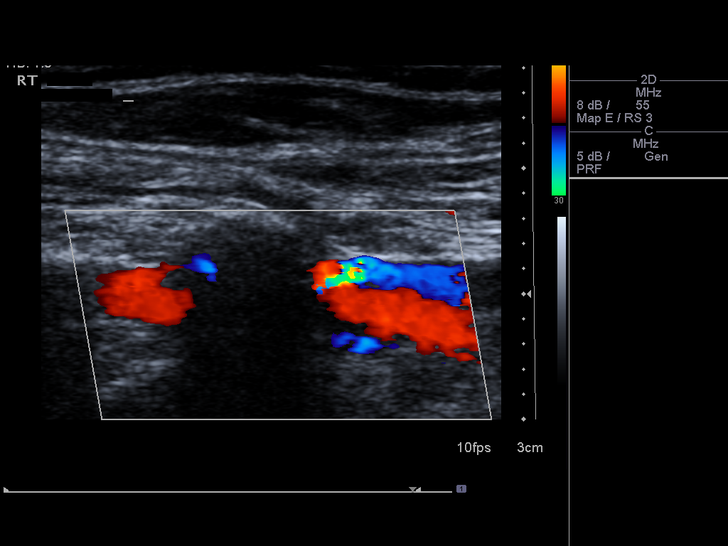
[im 32/62]
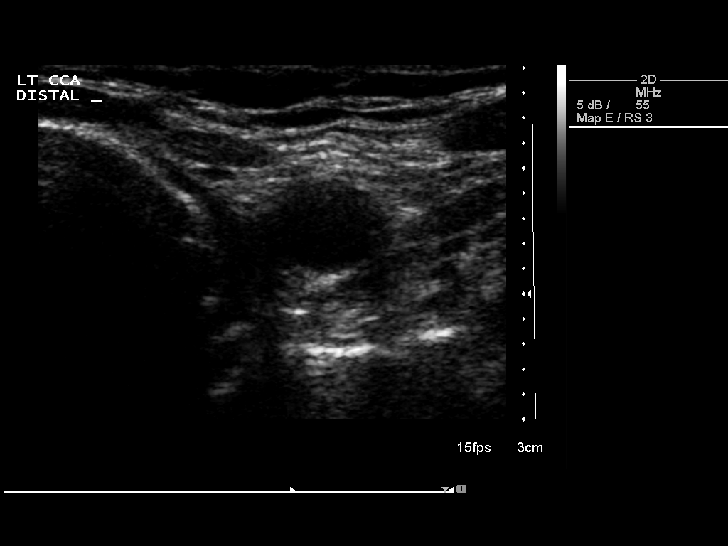
[im 35/62]
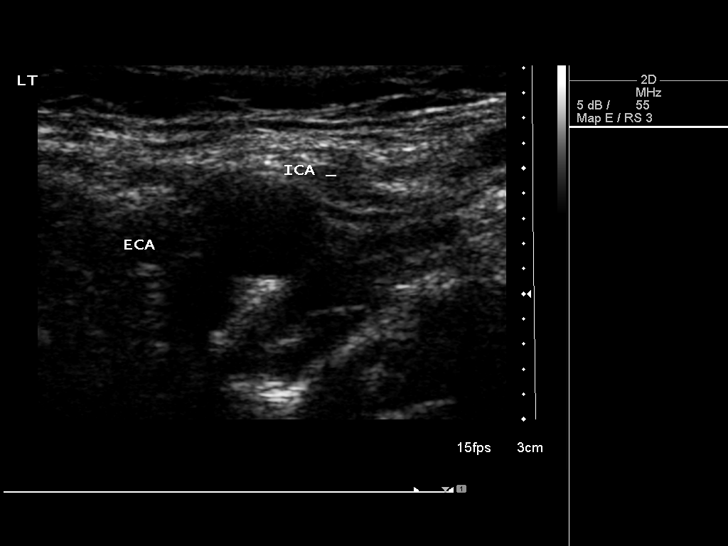
[im 40/62]
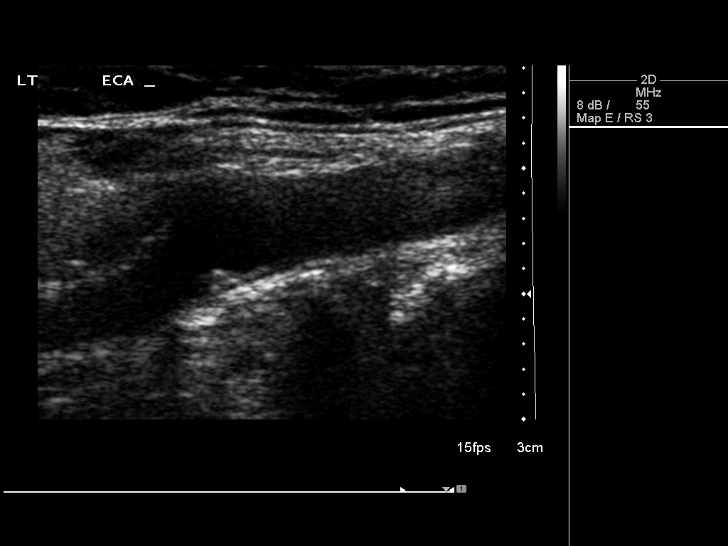
[im 46/62]
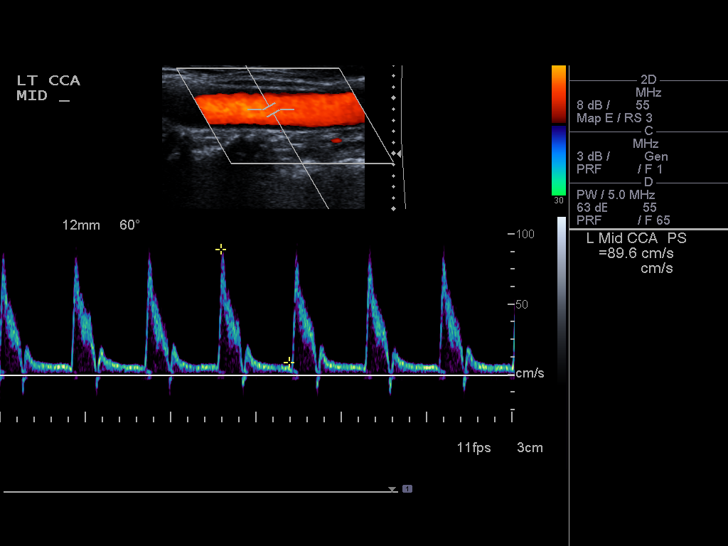
[im 51/62]
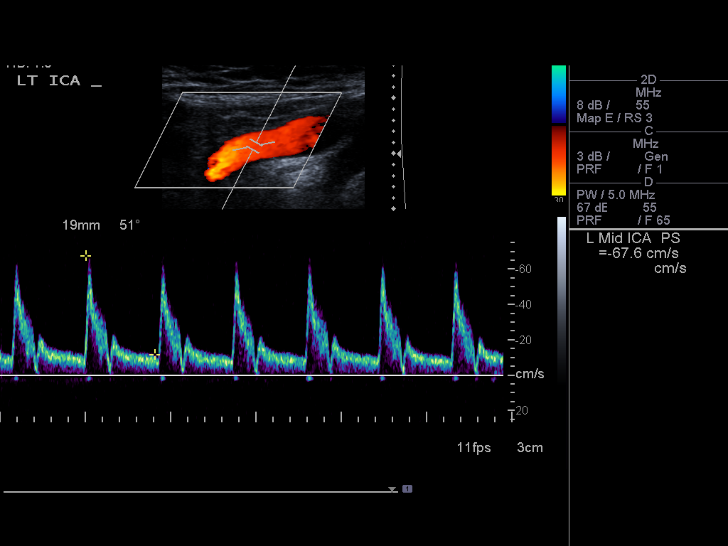
[im 56/62]
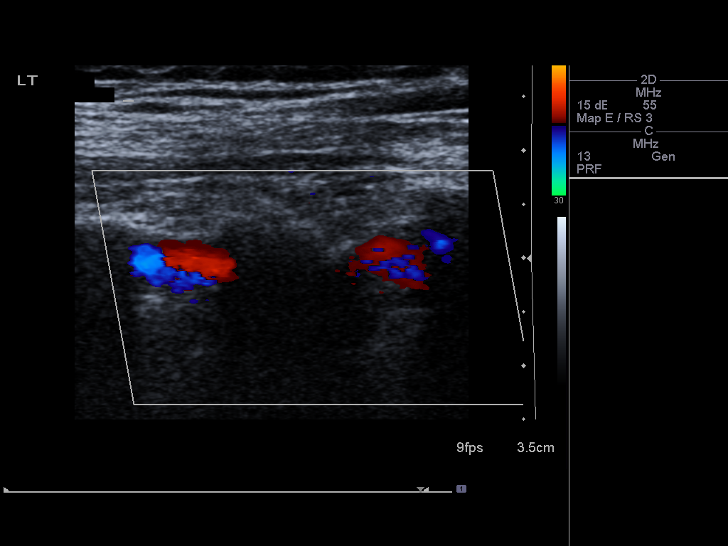
[im 62/62]
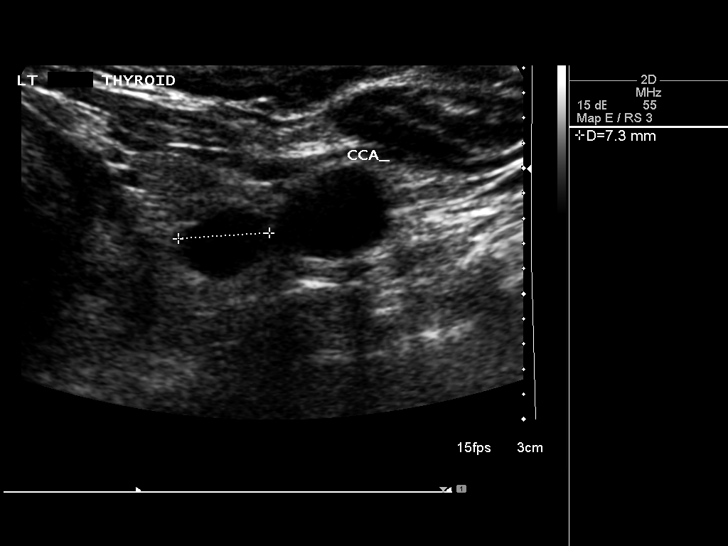

[13 of 24 positions shown; findings below may reference images not displayed]

Criteria:  Quantification of carotid stenosis is based on velocity
parameters that correlate the residual internal carotid diameter
with NASCET-based stenosis levels, using the diameter of the distal
internal carotid lumen as the denominator for stenosis measurement.

The following velocity measurements were obtained:

                 PEAK SYSTOLIC/END DIASTOLIC
RIGHT
ICA:                        70/12cm/sec
CCA:                        94/9cm/sec
SYSTOLIC ICA/CCA RATIO:
DIASTOLIC ICA/CCA RATIO:
ECA:                        55cm/sec

LEFT
ICA:                        75/12cm/sec
CCA:                        93/9cm/sec
SYSTOLIC ICA/CCA RATIO:
DIASTOLIC ICA/CCA RATIO:
ECA:                        54cm/sec
FINDINGS: RIGHT CAROTID ARTERY: Minor atherosclerotic changes.  No
hemodynamically significant right ICA stenosis, velocity elevation,
or turbulent flow.

RIGHT VERTEBRAL ARTERY:  Antegrade

LEFT CAROTID ARTERY: Similar minor atherosclerotic changes.  Mild
tortuosity.  No hemodynamically significant left ICA stenosis,
velocity elevation, or turbulent flow.

LEFT VERTEBRAL ARTERY:  Antegrade

Incidental left thyroid 10 mm cyst noted.
IMPRESSION: Minor atherosclerotic changes.  No hemodynamically significant ICA
stenosis by ultrasound.

## 2012-12-11 ENCOUNTER — Ambulatory Visit: Payer: Medicare Other | Admitting: Internal Medicine

## 2013-03-20 ENCOUNTER — Ambulatory Visit (INDEPENDENT_AMBULATORY_CARE_PROVIDER_SITE_OTHER): Payer: Medicare Other | Admitting: Pharmacist

## 2013-03-20 DIAGNOSIS — I4891 Unspecified atrial fibrillation: Secondary | ICD-10-CM

## 2013-03-20 LAB — POCT INR: INR: 4.1

## 2013-03-30 ENCOUNTER — Ambulatory Visit (INDEPENDENT_AMBULATORY_CARE_PROVIDER_SITE_OTHER): Payer: Medicare Other | Admitting: Pharmacist

## 2013-03-30 DIAGNOSIS — I4891 Unspecified atrial fibrillation: Secondary | ICD-10-CM

## 2013-04-13 ENCOUNTER — Ambulatory Visit (INDEPENDENT_AMBULATORY_CARE_PROVIDER_SITE_OTHER): Payer: Medicare Other | Admitting: Pharmacist

## 2013-04-13 DIAGNOSIS — I4891 Unspecified atrial fibrillation: Secondary | ICD-10-CM

## 2013-04-13 LAB — POCT INR: INR: 1.3

## 2013-04-26 ENCOUNTER — Ambulatory Visit (INDEPENDENT_AMBULATORY_CARE_PROVIDER_SITE_OTHER): Payer: Medicare Other | Admitting: Pharmacist

## 2013-04-26 DIAGNOSIS — I4891 Unspecified atrial fibrillation: Secondary | ICD-10-CM

## 2013-05-16 ENCOUNTER — Ambulatory Visit (INDEPENDENT_AMBULATORY_CARE_PROVIDER_SITE_OTHER): Payer: Medicare Other | Admitting: Pharmacist

## 2013-05-16 VITALS — BP 146/84

## 2013-05-16 DIAGNOSIS — I4891 Unspecified atrial fibrillation: Secondary | ICD-10-CM

## 2013-05-16 LAB — POCT INR: INR: 2.2

## 2013-06-15 ENCOUNTER — Ambulatory Visit (INDEPENDENT_AMBULATORY_CARE_PROVIDER_SITE_OTHER): Payer: Medicare Other | Admitting: *Deleted

## 2013-06-15 DIAGNOSIS — I4891 Unspecified atrial fibrillation: Secondary | ICD-10-CM

## 2013-06-15 LAB — POCT INR: INR: 2.1

## 2013-07-13 ENCOUNTER — Ambulatory Visit (INDEPENDENT_AMBULATORY_CARE_PROVIDER_SITE_OTHER): Payer: Medicare Other | Admitting: *Deleted

## 2013-07-13 DIAGNOSIS — Z5181 Encounter for therapeutic drug level monitoring: Secondary | ICD-10-CM | POA: Insufficient documentation

## 2013-07-13 DIAGNOSIS — I4891 Unspecified atrial fibrillation: Secondary | ICD-10-CM

## 2013-07-13 LAB — POCT INR: INR: 1.8

## 2013-07-27 ENCOUNTER — Ambulatory Visit (INDEPENDENT_AMBULATORY_CARE_PROVIDER_SITE_OTHER): Payer: Medicare Other | Admitting: *Deleted

## 2013-07-27 DIAGNOSIS — I4891 Unspecified atrial fibrillation: Secondary | ICD-10-CM

## 2013-07-27 DIAGNOSIS — Z5181 Encounter for therapeutic drug level monitoring: Secondary | ICD-10-CM

## 2013-07-27 LAB — POCT INR: INR: 2

## 2013-08-20 ENCOUNTER — Ambulatory Visit (INDEPENDENT_AMBULATORY_CARE_PROVIDER_SITE_OTHER): Payer: Medicare Other | Admitting: *Deleted

## 2013-08-20 DIAGNOSIS — Z5181 Encounter for therapeutic drug level monitoring: Secondary | ICD-10-CM

## 2013-08-20 DIAGNOSIS — I4891 Unspecified atrial fibrillation: Secondary | ICD-10-CM

## 2013-08-20 LAB — POCT INR: INR: 1.4

## 2013-09-03 ENCOUNTER — Ambulatory Visit (INDEPENDENT_AMBULATORY_CARE_PROVIDER_SITE_OTHER): Payer: Medicare Other | Admitting: Pharmacist

## 2013-09-03 DIAGNOSIS — I4891 Unspecified atrial fibrillation: Secondary | ICD-10-CM

## 2013-09-03 DIAGNOSIS — Z5181 Encounter for therapeutic drug level monitoring: Secondary | ICD-10-CM

## 2013-09-03 LAB — POCT INR: INR: 1.5

## 2013-09-13 ENCOUNTER — Ambulatory Visit (INDEPENDENT_AMBULATORY_CARE_PROVIDER_SITE_OTHER): Payer: Medicare Other | Admitting: *Deleted

## 2013-09-13 DIAGNOSIS — Z5181 Encounter for therapeutic drug level monitoring: Secondary | ICD-10-CM

## 2013-09-13 DIAGNOSIS — I4891 Unspecified atrial fibrillation: Secondary | ICD-10-CM

## 2013-09-13 LAB — POCT INR: INR: 1.5

## 2013-09-19 ENCOUNTER — Telehealth: Payer: Self-pay | Admitting: Interventional Cardiology

## 2013-09-19 MED ORDER — WARFARIN SODIUM 5 MG PO TABS: 5.0000 mg | ORAL_TABLET | Freq: Every day | ORAL | Status: DC

## 2013-09-19 NOTE — Telephone Encounter (Signed)
Patient needs RF on warfarin. Please call into [facility], if any questions please call patient. Patient is completely out of meds.

## 2013-09-20 ENCOUNTER — Other Ambulatory Visit: Payer: Self-pay | Admitting: *Deleted

## 2013-09-20 MED ORDER — WARFARIN SODIUM 5 MG PO TABS: ORAL_TABLET | ORAL | Status: DC

## 2013-09-24 ENCOUNTER — Ambulatory Visit (INDEPENDENT_AMBULATORY_CARE_PROVIDER_SITE_OTHER): Payer: Medicare Other

## 2013-09-24 DIAGNOSIS — Z5181 Encounter for therapeutic drug level monitoring: Secondary | ICD-10-CM

## 2013-09-24 DIAGNOSIS — I4891 Unspecified atrial fibrillation: Secondary | ICD-10-CM

## 2013-09-24 LAB — POCT INR: INR: 1.7

## 2013-10-04 ENCOUNTER — Ambulatory Visit (INDEPENDENT_AMBULATORY_CARE_PROVIDER_SITE_OTHER): Payer: Medicare Other

## 2013-10-04 DIAGNOSIS — I4891 Unspecified atrial fibrillation: Secondary | ICD-10-CM

## 2013-10-04 DIAGNOSIS — Z5181 Encounter for therapeutic drug level monitoring: Secondary | ICD-10-CM

## 2013-10-04 LAB — POCT INR: INR: 2.8

## 2013-10-25 ENCOUNTER — Ambulatory Visit (INDEPENDENT_AMBULATORY_CARE_PROVIDER_SITE_OTHER): Payer: Medicare Other | Admitting: Pharmacist

## 2013-10-25 DIAGNOSIS — I4891 Unspecified atrial fibrillation: Secondary | ICD-10-CM

## 2013-10-25 DIAGNOSIS — Z5181 Encounter for therapeutic drug level monitoring: Secondary | ICD-10-CM

## 2013-10-25 LAB — POCT INR: INR: 1.7

## 2013-11-08 ENCOUNTER — Ambulatory Visit (INDEPENDENT_AMBULATORY_CARE_PROVIDER_SITE_OTHER): Payer: Medicare Other | Admitting: *Deleted

## 2013-11-08 DIAGNOSIS — I4891 Unspecified atrial fibrillation: Secondary | ICD-10-CM

## 2013-11-08 DIAGNOSIS — Z5181 Encounter for therapeutic drug level monitoring: Secondary | ICD-10-CM

## 2013-11-08 LAB — POCT INR: INR: 2.5

## 2013-11-29 ENCOUNTER — Ambulatory Visit (INDEPENDENT_AMBULATORY_CARE_PROVIDER_SITE_OTHER): Payer: Medicare Other | Admitting: *Deleted

## 2013-11-29 DIAGNOSIS — Z5181 Encounter for therapeutic drug level monitoring: Secondary | ICD-10-CM

## 2013-11-29 DIAGNOSIS — I4891 Unspecified atrial fibrillation: Secondary | ICD-10-CM

## 2013-11-29 LAB — POCT INR: INR: 2.7

## 2013-12-27 ENCOUNTER — Ambulatory Visit (INDEPENDENT_AMBULATORY_CARE_PROVIDER_SITE_OTHER): Payer: Medicare Other | Admitting: *Deleted

## 2013-12-27 DIAGNOSIS — I4891 Unspecified atrial fibrillation: Secondary | ICD-10-CM

## 2013-12-27 DIAGNOSIS — Z5181 Encounter for therapeutic drug level monitoring: Secondary | ICD-10-CM

## 2013-12-27 LAB — POCT INR: INR: 2.2

## 2014-01-01 ENCOUNTER — Ambulatory Visit (INDEPENDENT_AMBULATORY_CARE_PROVIDER_SITE_OTHER): Payer: Medicare Other | Admitting: *Deleted

## 2014-01-01 DIAGNOSIS — Z5181 Encounter for therapeutic drug level monitoring: Secondary | ICD-10-CM

## 2014-01-01 DIAGNOSIS — I4891 Unspecified atrial fibrillation: Secondary | ICD-10-CM

## 2014-01-01 LAB — POCT INR: INR: 2.2

## 2014-01-08 ENCOUNTER — Ambulatory Visit (INDEPENDENT_AMBULATORY_CARE_PROVIDER_SITE_OTHER): Payer: Medicare Other | Admitting: Pharmacist

## 2014-01-08 ENCOUNTER — Ambulatory Visit: Payer: Medicare Other | Admitting: Interventional Cardiology

## 2014-01-08 DIAGNOSIS — I4891 Unspecified atrial fibrillation: Secondary | ICD-10-CM

## 2014-01-08 DIAGNOSIS — Z5181 Encounter for therapeutic drug level monitoring: Secondary | ICD-10-CM

## 2014-01-08 LAB — POCT INR: INR: 2.2

## 2014-01-18 ENCOUNTER — Encounter: Payer: Self-pay | Admitting: Interventional Cardiology

## 2014-01-18 ENCOUNTER — Ambulatory Visit (INDEPENDENT_AMBULATORY_CARE_PROVIDER_SITE_OTHER): Payer: Medicare Other | Admitting: Interventional Cardiology

## 2014-01-18 ENCOUNTER — Ambulatory Visit (INDEPENDENT_AMBULATORY_CARE_PROVIDER_SITE_OTHER): Payer: Medicare Other | Admitting: Pharmacist

## 2014-01-18 VITALS — BP 142/85 | HR 81 | Ht 68.0 in | Wt 234.0 lb

## 2014-01-18 DIAGNOSIS — R5383 Other fatigue: Secondary | ICD-10-CM

## 2014-01-18 DIAGNOSIS — I4891 Unspecified atrial fibrillation: Secondary | ICD-10-CM

## 2014-01-18 DIAGNOSIS — I10 Essential (primary) hypertension: Secondary | ICD-10-CM

## 2014-01-18 DIAGNOSIS — Z5181 Encounter for therapeutic drug level monitoring: Secondary | ICD-10-CM

## 2014-01-18 DIAGNOSIS — I480 Paroxysmal atrial fibrillation: Secondary | ICD-10-CM

## 2014-01-18 DIAGNOSIS — Z7901 Long term (current) use of anticoagulants: Secondary | ICD-10-CM

## 2014-01-18 DIAGNOSIS — R5381 Other malaise: Secondary | ICD-10-CM

## 2014-01-18 DIAGNOSIS — R5382 Chronic fatigue, unspecified: Secondary | ICD-10-CM

## 2014-01-18 LAB — POCT INR: INR: 2.5

## 2014-01-18 NOTE — Patient Instructions (Signed)
Your physician recommends that you continue on your current medications as directed. Please refer to the Current Medication list given to you today.  Your physician wants you to follow-up in: 1 year with Dr. Varanasi. You will receive a reminder letter in the mail two months in advance. If you don't receive a letter, please call our office to schedule the follow-up appointment.  

## 2014-01-18 NOTE — Progress Notes (Signed)
Patient ID: Charlotte Castro, female   DOB: Oct 15, 1932, 80 y.o.   MRN: [id number]    [place], [address] [place], [place]  [address] Phone: [phone] Fax:  [phone]  Date:  01/18/2014   ID:  Charlotte Castro, DOB [birth year], MRN [id number]  PCP:  Antony Blackbird, MD      History of Present Illness: Charlotte Castro is a 80 y.o. female who has had several different types of tachyarrhythmia and bradycardia which have been difficult to manage over the past few years, since 2012-13; due to other medical issues. She has had syncope and was hospitalized. We have tried to evaluate her rhythm at home to see if she has significant pauses or heart rates below 40. She does not want to wear any type of heart monitor. Her daughter came in the past, annoyed with her mothers current health condition. Daughter stated she is self-employed so she has to take time off from work every time the mother is sick. She "wanted to get to the bottom of this." She was sent back to Dr. Rayann Heman. THere was no indication for pacer. She is off of amiodarone.  She had a grey coating on her eyelid.  The patient was seeing neurology, but she stopped. She has a tremor and had been started on Parkinson's medicines. These aparently have been stopped. The patient disagreed with the diagnosis of Parkinsons. She was seen in the emergency room for syncope in 2014. Her main complaint is feeling weak and having joint pains. She does admit to significant anxiety which happens at random times. The daughter felt that anxiety is a big component of the patient's illness, when she was here at the last visit in 2/14.  THe patient was seen by ortho and rheumatology since the last visit with me.  She feels better on prednisone but this is being tapered.  She still reports no energy and fatigue.  She has had some difficulty in regulating her INR.  Despite this, she is better than she was last year.    Wt Readings from Last 3 Encounters:  01/18/14  234 lb (106.142 kg)  10/23/12 227 lb (102.967 kg)  10/11/12 233 lb (105.688 kg)     Past Medical History  Diagnosis Date  . Dizziness     chronic and of an unclear etiology  . Hypothyroidism   . HTN (hypertension)   . Hyperlipemia   . Obesity   . Osteoporosis   . Gastritis   . Vitamin D deficiency   . Atrial tachycardia     ablated 11/17/10  by JA  from the [facility] of the aorta  . Atrial flutter     typical appearing  . Atrial fibrillation   . GERD (gastroesophageal reflux disease)     Current Outpatient Prescriptions  Medication Sig Dispense Refill  . acetaminophen (TYLENOL) 325 MG tablet Take 650 mg by mouth every 4 (four) hours as needed. For headache      . Calcium Carbonate-Vitamin D (CALCIUM-VITAMIN D) 600-200 MG-UNIT CAPS Take 1 capsule by mouth daily.       . Cholecalciferol (VITAMIN D3) 1000 UNITS CAPS Take 1 capsule by mouth daily.      . clonazePAM (KLONOPIN) 0.5 MG tablet Take 0.5 mg by mouth 2 (two) times daily as needed for anxiety.      Marland Kitchen levothyroxine (SYNTHROID, LEVOTHROID) 88 MCG tablet Take 88 mcg by mouth daily.      . pravastatin (PRAVACHOL) 20 MG tablet  Take 20 mg by mouth daily.      . predniSONE (DELTASONE) 20 MG tablet As directed by rheumatologist      . warfarin (COUMADIN) 5 MG tablet Take as directed by coumadin clinic  100 tablet  1   No current facility-administered medications for this visit.    Allergies:    Allergies  Allergen Reactions  . Iodinated Diagnostic Agents Shortness Of Breath    headache    Social History:  The patient  reports that she has never smoked. She has never used smokeless tobacco. She reports that she does not drink alcohol or use illicit drugs.   Family History:  The patient's family history includes Hypertension in an other family member.   ROS:  Please see the history of present illness.  No nausea, vomiting.  No fevers, chills.  No focal weakness.  No dysuria. No recent syncope.     All other systems reviewed and  negative.   PHYSICAL EXAM: VS:  BP 142/85  Pulse 81  Ht 5\' 8"  (1.727 m)  Wt 234 lb (106.142 kg)  BMI 35.59 kg/m2 Well nourished, well developed, in no acute distress HEENT: normal Neck: no JVD, no carotid bruits Cardiac:  normal S1, S2; RRR; occasional premature beats Lungs:  clear to auscultation bilaterally, no wheezing, rhonchi or rales Abd: soft, nontender, no hepatomegaly Ext: no edema Skin: warm and dry Neuro:   no focal abnormalities noted  EKG:  NSR, no ST segment changes    ASSESSMENT AND PLAN:  Tachycardia, unspecified  Notes: No sx at this time. Dr. Rayann Heman stopped the amiodarone. Some bradycardia as well in the past. Of atenolol as well.  Feels tremor is better; although beta blocker is usually used for tremor. Tremor better on clonazepam. F/u with Dr. Rayann Heman as needed.   2. Essential hypertension, benign  Notes: Controlled here today. I would like to see readings from home which are typically in the 140/80. Check BP at home 3x/week, and call readings into our office if above 150/90.   3. Fatigue/Weakness  Notes: Likley multifactorial. She does not want to repeat physical therapy. Tremor is better.  She needs to increase exercise.  SHe should always use a cane for walking.    4. Atrial fibrillation  Notes: COumadin for stroke prevention.    5. Encounter for long-term (current) use of anticoagulants  Notes: INR to be checked and coumadin dose to be adjusted by PharmD. Please see his note for details.  No recent falls.   Preventive Medicine  Adult topics discussed:  Diet: healthy diet.  Exercise: gradually increasing to, 5 days a week, at least 30 minutes of aerobic exercise.      Signed, Charlotte Marble, MD, [facility] 01/18/2014 10:52 AM

## 2014-02-15 ENCOUNTER — Ambulatory Visit (INDEPENDENT_AMBULATORY_CARE_PROVIDER_SITE_OTHER): Payer: Medicare Other | Admitting: Surgery

## 2014-02-15 DIAGNOSIS — Z5181 Encounter for therapeutic drug level monitoring: Secondary | ICD-10-CM

## 2014-02-15 DIAGNOSIS — I4891 Unspecified atrial fibrillation: Secondary | ICD-10-CM

## 2014-02-15 LAB — POCT INR: INR: 2.8

## 2014-03-28 ENCOUNTER — Ambulatory Visit: Payer: Medicare Other | Admitting: Interventional Cardiology

## 2014-03-29 ENCOUNTER — Ambulatory Visit (INDEPENDENT_AMBULATORY_CARE_PROVIDER_SITE_OTHER): Payer: Medicare Other | Admitting: Interventional Cardiology

## 2014-03-29 ENCOUNTER — Encounter: Payer: Self-pay | Admitting: Interventional Cardiology

## 2014-03-29 ENCOUNTER — Ambulatory Visit (INDEPENDENT_AMBULATORY_CARE_PROVIDER_SITE_OTHER): Payer: Medicare Other

## 2014-03-29 VITALS — BP 150/82 | HR 120 | Ht 68.0 in | Wt 238.0 lb

## 2014-03-29 DIAGNOSIS — I482 Chronic atrial fibrillation, unspecified: Secondary | ICD-10-CM

## 2014-03-29 DIAGNOSIS — I4891 Unspecified atrial fibrillation: Secondary | ICD-10-CM

## 2014-03-29 DIAGNOSIS — Z5181 Encounter for therapeutic drug level monitoring: Secondary | ICD-10-CM

## 2014-03-29 DIAGNOSIS — I10 Essential (primary) hypertension: Secondary | ICD-10-CM

## 2014-03-29 DIAGNOSIS — R5383 Other fatigue: Secondary | ICD-10-CM

## 2014-03-29 LAB — POCT INR: INR: 3.3

## 2014-03-29 MED ORDER — METOPROLOL TARTRATE 25 MG PO TABS: 25.0000 mg | ORAL_TABLET | Freq: Two times a day (BID) | ORAL | Status: DC

## 2014-03-29 NOTE — Progress Notes (Signed)
Patient ID: Charlotte Castro, female   DOB: 08-15-1932, 80 y.o.   MRN: [id number] Patient ID: Charlotte Castro, female   DOB: [birth year], 80 y.o.   MRN: [id number]    [place], [place] [address] [place], [place]  [address] Phone: [phone] Fax:  [phone]  Date:  03/29/2014   ID:  Charlotte Castro, DOB Mar 27, 1933, MRN [id number]  PCP:  Antony Blackbird, MD      History of Present Illness: Charlotte Castro is a 80 y.o. female who has had several different types of tachyarrhythmia and bradycardia which have been difficult to manage over the past few years, since 2012-13; due to other medical issues. She has had syncope and was hospitalized. We have tried to evaluate her rhythm at home to see if she has significant pauses or heart rates below 40. She does not want to wear any type of heart monitor. Her daughter came in the past, annoyed with her mothers current health condition. Daughter stated she is self-employed so she has to take time off from work every time the mother is sick. She "wanted to get to the bottom of this." She was sent back to Dr. Rayann Heman. THere was no indication for pacer. She is off of amiodarone.  She had a grey coating on her eyelid.  The patient was seeing neurology, but she stopped. She has a tremor and had been started on Parkinson's medicines. These aparently have been stopped. The patient disagreed with the diagnosis of Parkinsons. She was seen in the emergency room for syncope in 2014. Her main complaint is feeling weak and having joint pains. She does admit to significant anxiety which happens at random times. The daughter felt that anxiety is a big component of the patient's illness, when she was here at the last visit in 2/14.  THe patient was seen by ortho and rheumatology since the last visit with me.  She feels better on prednisone but this is being tapered.  She still reports no energy and fatigue.  She has had some difficulty in regulating her INR.  Despite this, she is better  than she was last year.  She is having pain in her knees, worse as she has been of of the steroids.    She was noted to be tachycardic by her PMD.      Wt Readings from Last 3 Encounters:  03/29/14 238 lb (107.956 kg)  01/18/14 234 lb (106.142 kg)  10/23/12 227 lb (102.967 kg)     Past Medical History  Diagnosis Date  . Dizziness     chronic and of an unclear etiology  . Hypothyroidism   . HTN (hypertension)   . Hyperlipemia   . Obesity   . Osteoporosis   . Gastritis   . Vitamin D deficiency   . Atrial tachycardia     ablated 11/17/10  by JA  from the [facility] of the aorta  . Atrial flutter     typical appearing  . Atrial fibrillation   . GERD (gastroesophageal reflux disease)     Current Outpatient Prescriptions  Medication Sig Dispense Refill  . acetaminophen (TYLENOL) 325 MG tablet Take 650 mg by mouth every 4 (four) hours as needed. For headache      . acetaminophen (TYLENOL) 325 MG tablet Take 325 mg by mouth 2 (two) times daily. 1-2 tablets      . Calcium Carbonate-Vitamin D (CALCIUM-VITAMIN D) 600-200 MG-UNIT CAPS Take 1 capsule by mouth daily.       Marland Kitchen  Cholecalciferol (VITAMIN D3) 1000 UNITS CAPS Take 1 capsule by mouth daily.      . clonazePAM (KLONOPIN) 0.5 MG tablet Take 0.5 mg by mouth 2 (two) times daily as needed for anxiety.      Marland Kitchen levothyroxine (SYNTHROID, LEVOTHROID) 88 MCG tablet Take 88 mcg by mouth daily.      . pravastatin (PRAVACHOL) 20 MG tablet Take 20 mg by mouth daily.      . predniSONE (DELTASONE) 20 MG tablet As directed by rheumatologist      . warfarin (COUMADIN) 5 MG tablet Take as directed by coumadin clinic  100 tablet  1  . metoprolol tartrate (LOPRESSOR) 25 MG tablet Take 1 tablet (25 mg total) by mouth 2 (two) times daily.  60 tablet  11   No current facility-administered medications for this visit.    Allergies:    Allergies  Allergen Reactions  . Iodinated Diagnostic Agents Shortness Of Breath    headache    Social History:  The  patient  reports that she has never smoked. She has never used smokeless tobacco. She reports that she does not drink alcohol or use illicit drugs.   Family History:  The patient's family history includes Heart attack in her father; Hypertension in her father.   ROS:  Please see the history of present illness.  No nausea, vomiting.  No fevers, chills.  No focal weakness.  No dysuria. No recent syncope.     All other systems reviewed and negative.   PHYSICAL EXAM: VS:  BP 150/82  Pulse 120  Ht 5\' 8"  (1.727 m)  Wt 238 lb (107.956 kg)  BMI 36.20 kg/m2 Well nourished, well developed, in no acute distress HEENT: normal Neck: no JVD, no carotid bruits Cardiac:  normal S1, S2; irregularly irregular Lungs:  clear to auscultation bilaterally, no wheezing, rhonchi or rales Abd: soft, nontender, no hepatomegaly Ext: no edema Skin: warm and dry Neuro:   no focal abnormalities noted  EKG:  AFib with RVR    ASSESSMENT AND PLAN:  Tachycardia, unspecified  Notes: Some SHOB and weakness which is somewhat chronic.  However, HR is higher than we want.  Start Metoprolol 25 mg BID.  Beta blocker is usually used for tremor. Tremor better on clonazepam. F/u with Dr. Rayann Heman as needed.  Try for rate control.  She has not tolerated amiodarone in the past due to perceived side effects as well as some eye issues.   2. Essential hypertension, benign  Notes: Controlled here today. I would like to see readings from home which are typically in the 140/80. Check BP at home 3x/week, and call readings into our office if above 150/90.      3. Atrial fibrillation  Notes: COumadin for stroke prevention.   4.  Fatigue: May be worse with AFib.  Try for rate control.   5. Encounter for long-term (current) use of anticoagulants  Notes: INR to be checked and coumadin dose to be adjusted by PharmD. Please see his note for details.  No recent falls.   Preventive Medicine  Adult topics discussed:  Diet: healthy diet.    Exercise: gradually increasing to, 5 days a week, at least 30 minutes of aerobic exercise once HR controlled      Signed, Mina Marble, MD, [facility] 03/29/2014 10:21 AM

## 2014-03-29 NOTE — Patient Instructions (Signed)
Your physician recommends that you schedule a follow-up appointment in: 1 -[place]  EKG Your physician has recommended you make the following change in your medication:  START METOPROLOL   25  MG  TWICE DAILY

## 2014-04-05 ENCOUNTER — Telehealth: Payer: Self-pay | Admitting: *Deleted

## 2014-04-05 NOTE — Telephone Encounter (Signed)
LMTCB ./CY 

## 2014-04-05 NOTE — Telephone Encounter (Signed)
APPT MADE  FOR  04-30-14  THIS IS EARLIEST APPT WITH DR NISHAN./CY

## 2014-04-05 NOTE — Telephone Encounter (Signed)
Message copied by Richmond Campbell on Fri Apr 05, 2014 10:28 AM ------      Message from: Vashti Hey D      Created: Fri Mar 29, 2014 10:06 AM      Regarding: F/U APPOINTMENT       03/29/14 Patient needs an appointment in two weeks,I'm unable to fine something. ------

## 2014-04-19 ENCOUNTER — Ambulatory Visit (INDEPENDENT_AMBULATORY_CARE_PROVIDER_SITE_OTHER): Payer: Medicare Other | Admitting: *Deleted

## 2014-04-19 DIAGNOSIS — Z5181 Encounter for therapeutic drug level monitoring: Secondary | ICD-10-CM

## 2014-04-19 DIAGNOSIS — I4891 Unspecified atrial fibrillation: Secondary | ICD-10-CM

## 2014-04-19 LAB — POCT INR: INR: 2.9

## 2014-04-30 ENCOUNTER — Ambulatory Visit: Payer: Medicare Other | Admitting: Cardiovascular Disease

## 2014-05-17 ENCOUNTER — Ambulatory Visit (INDEPENDENT_AMBULATORY_CARE_PROVIDER_SITE_OTHER): Payer: Medicare Other | Admitting: *Deleted

## 2014-05-17 DIAGNOSIS — I4891 Unspecified atrial fibrillation: Secondary | ICD-10-CM

## 2014-05-17 DIAGNOSIS — Z5181 Encounter for therapeutic drug level monitoring: Secondary | ICD-10-CM

## 2014-05-17 LAB — POCT INR: INR: 3.6

## 2014-05-31 ENCOUNTER — Ambulatory Visit (INDEPENDENT_AMBULATORY_CARE_PROVIDER_SITE_OTHER): Payer: Medicare Other | Admitting: Pharmacist

## 2014-05-31 DIAGNOSIS — Z5181 Encounter for therapeutic drug level monitoring: Secondary | ICD-10-CM

## 2014-05-31 DIAGNOSIS — I4891 Unspecified atrial fibrillation: Secondary | ICD-10-CM

## 2014-05-31 LAB — POCT INR: INR: 2.8

## 2014-06-17 ENCOUNTER — Telehealth: Payer: Self-pay | Admitting: Interventional Cardiology

## 2014-06-17 NOTE — Telephone Encounter (Signed)
Spoke with pharmacist at [facility] gave verbal authorization to switch Warfarin generic manufacturer.  Next INR check on 06/28/14.

## 2014-06-17 NOTE — Telephone Encounter (Signed)
New message      Need to change manufacturer on the coumadin.  Please call

## 2014-06-25 ENCOUNTER — Ambulatory Visit (INDEPENDENT_AMBULATORY_CARE_PROVIDER_SITE_OTHER): Payer: Medicare Other | Admitting: Interventional Cardiology

## 2014-06-25 ENCOUNTER — Encounter: Payer: Self-pay | Admitting: Interventional Cardiology

## 2014-06-25 ENCOUNTER — Ambulatory Visit (INDEPENDENT_AMBULATORY_CARE_PROVIDER_SITE_OTHER): Payer: Medicare Other | Admitting: *Deleted

## 2014-06-25 VITALS — BP 122/76 | HR 64 | Ht 69.0 in | Wt 242.1 lb

## 2014-06-25 DIAGNOSIS — I10 Essential (primary) hypertension: Secondary | ICD-10-CM

## 2014-06-25 DIAGNOSIS — Z5181 Encounter for therapeutic drug level monitoring: Secondary | ICD-10-CM

## 2014-06-25 DIAGNOSIS — I4891 Unspecified atrial fibrillation: Secondary | ICD-10-CM

## 2014-06-25 LAB — POCT INR: INR: 4.3

## 2014-06-25 NOTE — Patient Instructions (Signed)
Your physician recommends that you continue on your current medications as directed. Please refer to the Current Medication list given to you today.  Your physician wants you to follow-up in: 6 months with Dr. Varanasi.  You will receive a reminder letter in the mail two months in advance. If you don't receive a letter, please call our office to schedule the follow-up appointment.  

## 2014-06-25 NOTE — Progress Notes (Signed)
Patient ID: Charlotte Castro, female   DOB: 1932-09-22, 81 y.o.   MRN: [id number]    [place], [address] [place], [place]  [address] Phone: [phone] Fax:  [phone]  Date:  06/25/2014   ID:  Charlotte Castro, DOB 19-Jun-1932, MRN [id number]  PCP:  Antony Blackbird, MD      History of Present Illness: Charlotte Castro is a 81 y.o. female who has had several different types of tachyarrhythmia and bradycardia which have been difficult to manage over the past few years, since 2012-13; due to other medical issues. She has had syncope and was hospitalized. We have tried to evaluate her rhythm at home to see if she has significant pauses or heart rates below 40. She does not want to wear any type of heart monitor. Her daughter came in the past, annoyed with her mothers current health condition. Daughter stated she is self-employed so she has to take time off from work every time the mother is sick. She "wanted to get to the bottom of this." She was sent back to Dr. Rayann Heman. THere was no indication for pacer. She is off of amiodarone.  She had a grey coating on her eyelid.  The patient was seeing neurology, but she stopped. She has a tremor and had been started on Parkinson's medicines. These aparently have been stopped. The patient disagreed with the diagnosis of Parkinsons. She was seen in the emergency room for syncope in 2014. Her main complaint is feeling weak and having joint pains. She does admit to significant anxiety which happens at random times. The daughter felt that anxiety is a big component of the patient's illness, when she was here at the last visit in 2/14.  THe patient was seen by ortho and rheumatology since the last visit with me.  She feels better on prednisone but this is being tapered.  She still reports no energy and fatigue.  She has had some difficulty in regulating her INR.  Despite this, she is better than she was last year.  She is having pain in her knees, worse as she has  been of of the steroids.    She was noted to be tachycardic by her PMD.  Metoprolol was started at the last visit.  Palpitations are better.  Tremor is also better, but not gone.  Se still feels tired and weak. She has chronic DOE as well. She still lives by herself.      Wt Readings from Last 3 Encounters:  06/25/14 242 lb 1.6 oz (109.816 kg)  03/29/14 238 lb (107.956 kg)  01/18/14 234 lb (106.142 kg)     Past Medical History  Diagnosis Date  . Dizziness     chronic and of an unclear etiology  . Hypothyroidism   . HTN (hypertension)   . Hyperlipemia   . Obesity   . Osteoporosis   . Gastritis   . Vitamin D deficiency   . Atrial tachycardia     ablated 11/17/10  by JA  from the [facility] of the aorta  . Atrial flutter     typical appearing  . Atrial fibrillation   . GERD (gastroesophageal reflux disease)     Current Outpatient Prescriptions  Medication Sig Dispense Refill  . acetaminophen (TYLENOL) 325 MG tablet Take 650 mg by mouth every 4 (four) hours as needed. For headache    . Calcium Carbonate-Vitamin D (CALCIUM-VITAMIN D) 600-200 MG-UNIT CAPS Take 1 capsule by mouth daily.     Marland Kitchen  Cholecalciferol (VITAMIN D3) 1000 UNITS CAPS Take 1 capsule by mouth daily.    . clonazePAM (KLONOPIN) 0.5 MG tablet Take 0.5 mg by mouth 2 (two) times daily as needed for anxiety.    Marland Kitchen levothyroxine (SYNTHROID, LEVOTHROID) 88 MCG tablet Take 88 mcg by mouth daily.    . metoprolol tartrate (LOPRESSOR) 25 MG tablet Take 1 tablet (25 mg total) by mouth 2 (two) times daily. 60 tablet 11  . pravastatin (PRAVACHOL) 20 MG tablet Take 20 mg by mouth daily.    Marland Kitchen warfarin (COUMADIN) 5 MG tablet Take as directed by coumadin clinic 100 tablet 1  . acetaminophen (TYLENOL) 325 MG tablet Take 325 mg by mouth 2 (two) times daily. 1-2 tablets     No current facility-administered medications for this visit.    Allergies:    Allergies  Allergen Reactions  . Iodinated Diagnostic Agents Shortness Of Breath     headache    Social History:  The patient  reports that she has never smoked. She has never used smokeless tobacco. She reports that she does not drink alcohol or use illicit drugs.   Family History:  The patient's family history includes Heart attack in her father; Hypertension in her father.   ROS:  Please see the history of present illness.  No nausea, vomiting.  No fevers, chills.  No focal weakness.  No dysuria. No recent syncope.  Chronic pain in the knees.  She is sleeping better.  All other systems reviewed and negative.   PHYSICAL EXAM: VS:  BP 122/76 mmHg  Pulse 64  Ht 5\' 9"  (1.753 m)  Wt 242 lb 1.6 oz (109.816 kg)  BMI 35.74 kg/m2 Well nourished, well developed, in no acute distress HEENT: normal Neck: no JVD, no carotid bruits Cardiac:  normal S1, S2; irregularly irregular Lungs:  clear to auscultation bilaterally, no wheezing, rhonchi or rales Abd: soft, nontender, no hepatomegaly Ext: tr ankle edema Skin: warm and dry Neuro:   no focal abnormalities noted Psych: anxious affect   ASSESSMENT AND PLAN:  Tachycardia, unspecified  Notes: Some SHOB and weakness which is somewhat chronic.   HR and symptoms of palpitations are better than last visit after Starting Metoprolol 25 mg BID.  Beta blocker is usually used for tremor. Tremor better on clonazepam also. F/u with Dr. Rayann Heman as needed.    She has not tolerated amiodarone in the past due to perceived side effects as well as some eye issues.   2. Essential hypertension, benign  Notes: Controlled here today. I would like to see readings from home which are typically in the 140/80. Check BP at home 3x/week, and call readings into our office if above 150/90.      3. Atrial fibrillation  Notes: COumadin for stroke prevention.  Recent INR 3.6 but still no bleeding issues.  No falls.  She is careful to try to avoid falls.   4.  Fatigue: Improved but not gone.   5. Encounter for long-term (current) use of anticoagulants    Notes: INR to be checked and coumadin dose to be adjusted by PharmD. Please see her note for details.  No recent falls.   Preventive Medicine  Adult topics discussed:  Diet: healthy diet.  Exercise: gradually increasing to, 5 days a week, at least 30 minutes of aerobic exercise once HR controlled      Signed, Mina Marble, MD, [facility] 06/25/2014 8:38 AM

## 2014-07-15 ENCOUNTER — Ambulatory Visit (INDEPENDENT_AMBULATORY_CARE_PROVIDER_SITE_OTHER): Payer: Medicare Other | Admitting: *Deleted

## 2014-07-15 DIAGNOSIS — I4891 Unspecified atrial fibrillation: Secondary | ICD-10-CM

## 2014-07-15 DIAGNOSIS — Z5181 Encounter for therapeutic drug level monitoring: Secondary | ICD-10-CM

## 2014-07-15 LAB — POCT INR: INR: 3.2

## 2014-08-02 ENCOUNTER — Ambulatory Visit (INDEPENDENT_AMBULATORY_CARE_PROVIDER_SITE_OTHER): Payer: Medicare Other | Admitting: *Deleted

## 2014-08-02 DIAGNOSIS — I4891 Unspecified atrial fibrillation: Secondary | ICD-10-CM

## 2014-08-02 DIAGNOSIS — Z5181 Encounter for therapeutic drug level monitoring: Secondary | ICD-10-CM

## 2014-08-02 LAB — POCT INR: INR: 2.8

## 2014-08-23 ENCOUNTER — Ambulatory Visit (INDEPENDENT_AMBULATORY_CARE_PROVIDER_SITE_OTHER): Payer: Medicare Other

## 2014-08-23 DIAGNOSIS — I4891 Unspecified atrial fibrillation: Secondary | ICD-10-CM

## 2014-08-23 DIAGNOSIS — Z5181 Encounter for therapeutic drug level monitoring: Secondary | ICD-10-CM

## 2014-08-23 LAB — POCT INR: INR: 1.2

## 2014-08-27 ENCOUNTER — Other Ambulatory Visit: Payer: Self-pay | Admitting: Rheumatology

## 2014-08-27 ENCOUNTER — Ambulatory Visit
Admission: RE | Admit: 2014-08-27 | Discharge: 2014-08-27 | Disposition: A | Discharge status: Home / Self Care | Payer: Medicare Other | Source: Ambulatory Visit | Attending: Rheumatology | Admitting: Rheumatology

## 2014-08-27 DIAGNOSIS — M545 Low back pain: Secondary | ICD-10-CM

## 2014-08-27 IMAGING — CR DG LUMBAR SPINE COMPLETE 4+V
6 series · 6 of 6 positions shown · non-contrast
Comparison: None.

CLINICAL DATA: Low back pain and sciatica, history of lumbar
surgery in [EB]

EXAM:
LUMBAR SPINE - COMPLETE 4+ VIEW

[t l-spine a.p.]
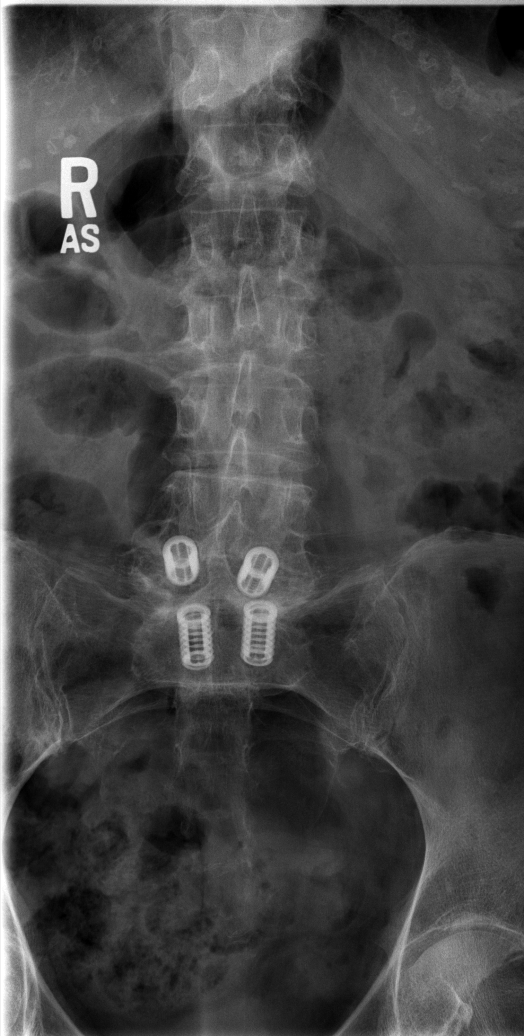

[t l-spine oblique exposure (1 of 3)]
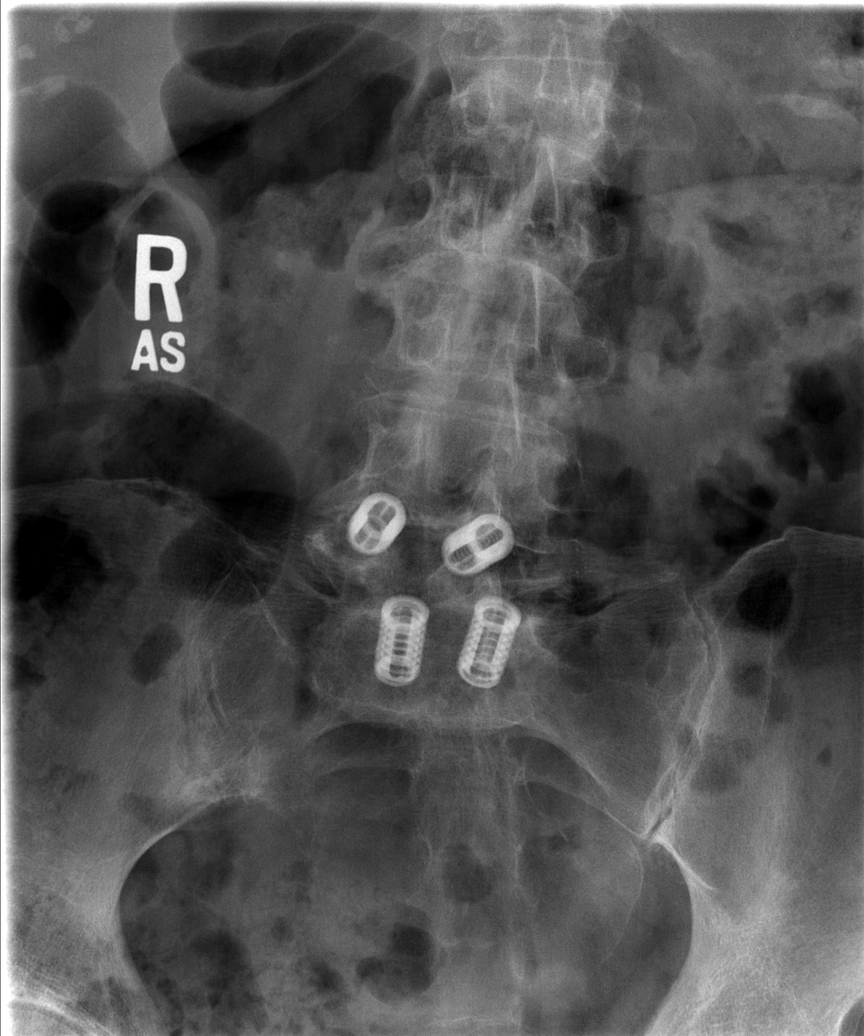

[t l-spine oblique exposure (2 of 3)]
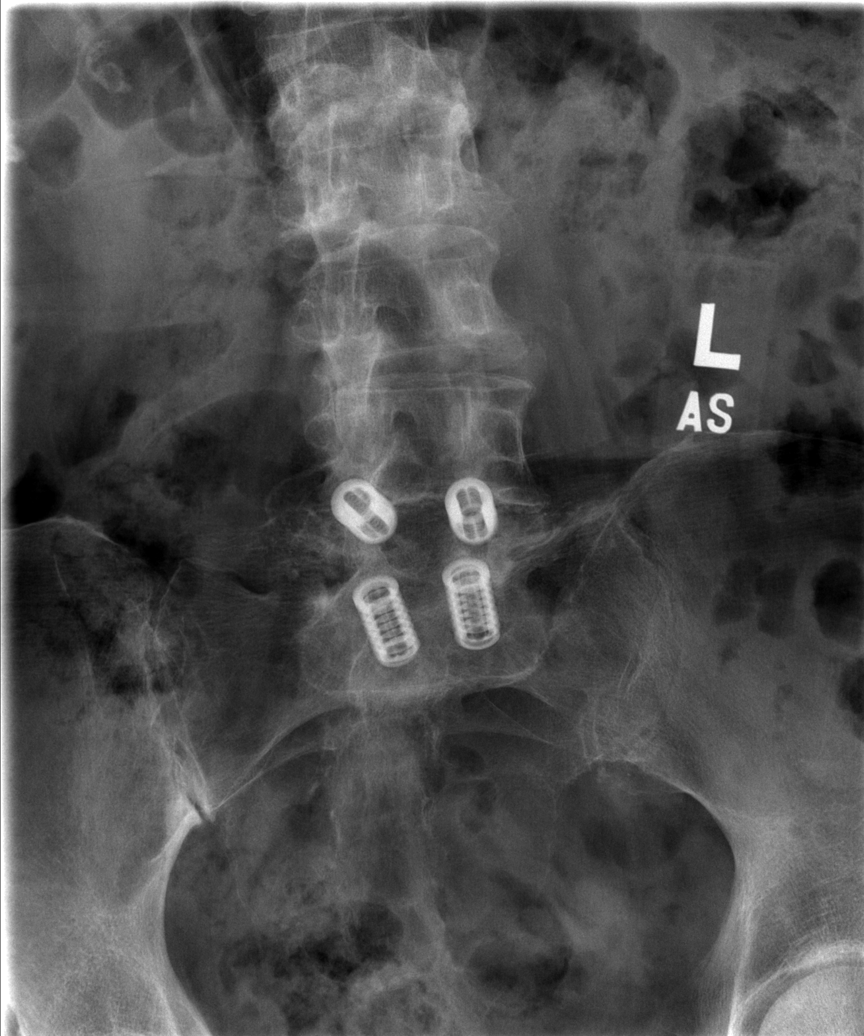

[t l-spine oblique exposure (3 of 3)]
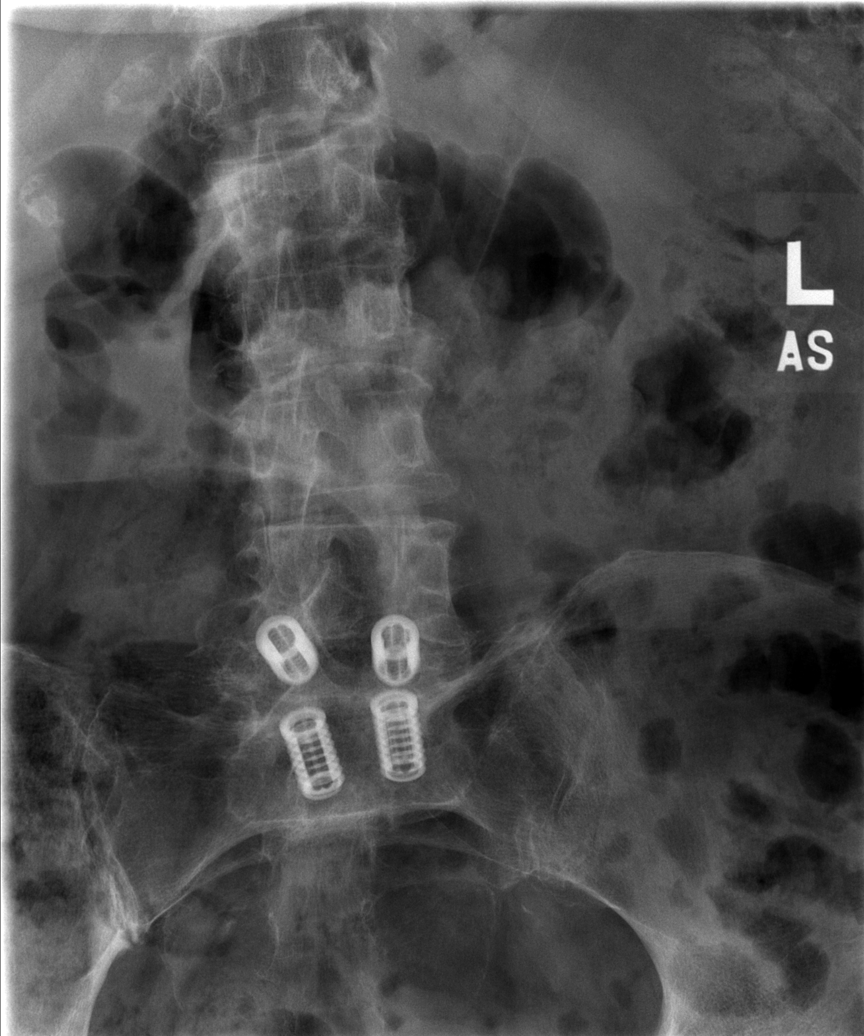

[t l-spine lat]
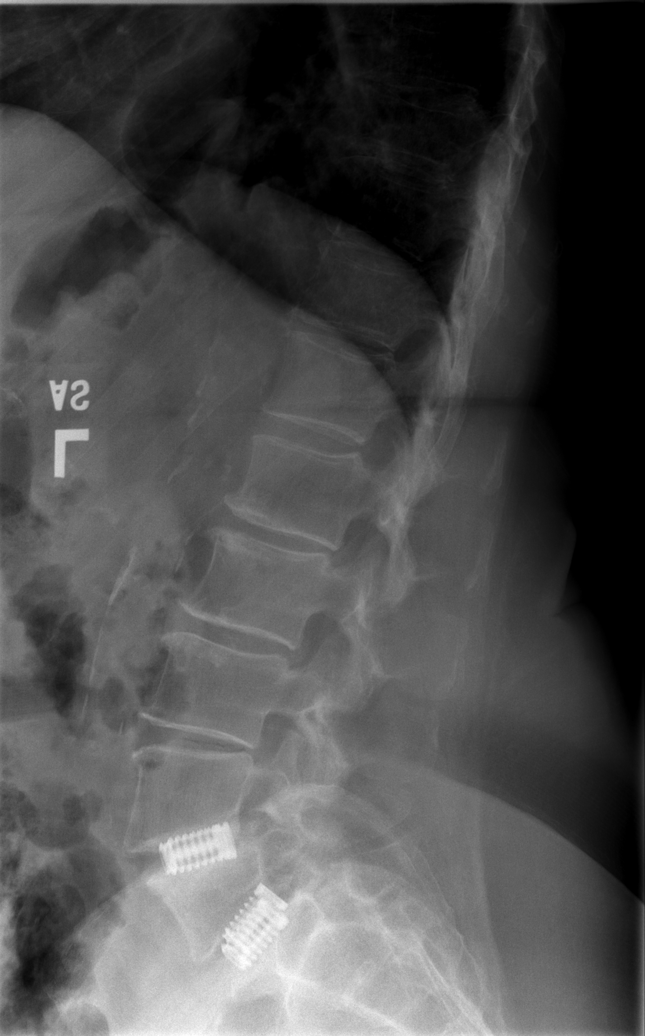

[t l-spine l5-s1 spot]
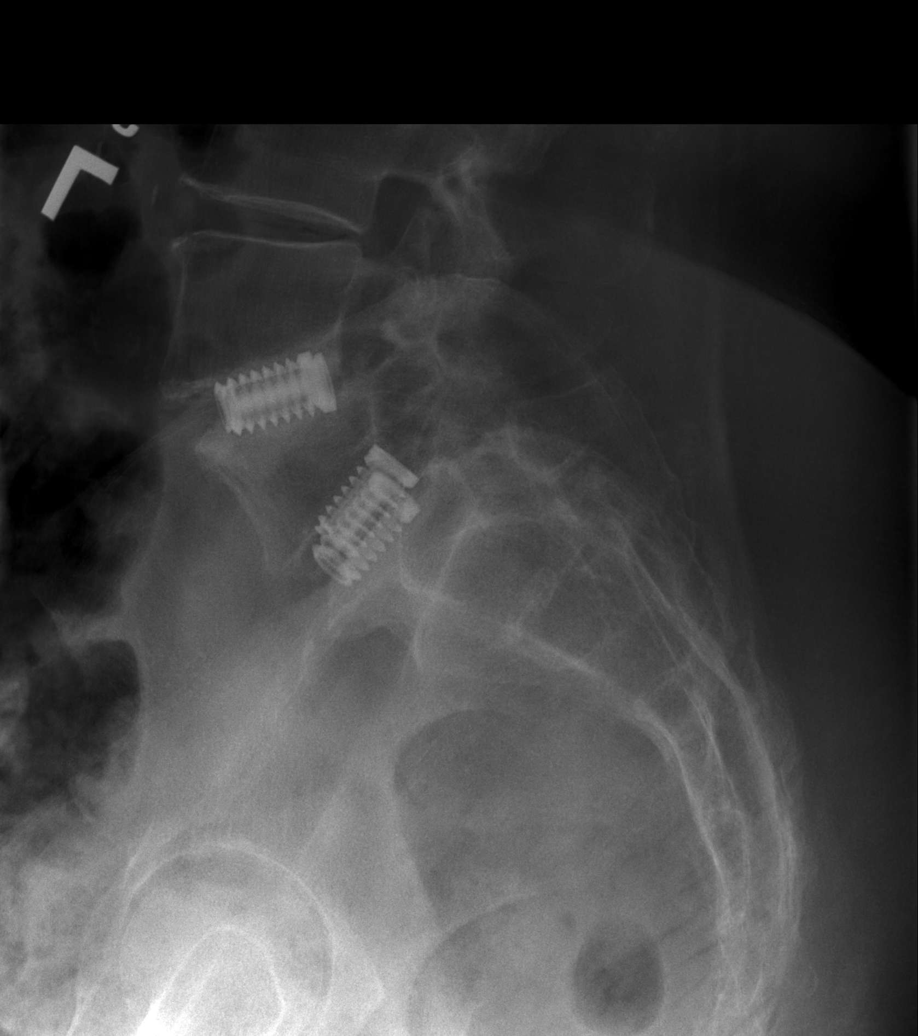

[6 of 6 positions shown; findings below may reference images not displayed]

FINDINGS: For purposes of this study there are assumed to be 5 lumbar type
non-rib-bearing vertebral bodies.

The lumbar vertebral bodies are preserved in height. The patient has
undergone previous interdiscal device placement at L4-5 and L5-S1.
There is mild disc space narrowing at the upper lumbar levels. There
is no spondylolisthesis. No significant facet joint hypertrophy is
demonstrated. The observed portions of the sacrum are unremarkable.
The oblique views are suboptimal in positioning.
IMPRESSION: 1. No acute lumbar spine abnormality is demonstrated.
2. Postsurgical changes at L4-5 and L5-S1.

## 2014-09-02 ENCOUNTER — Ambulatory Visit (INDEPENDENT_AMBULATORY_CARE_PROVIDER_SITE_OTHER): Payer: Medicare Other | Admitting: *Deleted

## 2014-09-02 DIAGNOSIS — Z5181 Encounter for therapeutic drug level monitoring: Secondary | ICD-10-CM | POA: Diagnosis not present

## 2014-09-02 DIAGNOSIS — I4891 Unspecified atrial fibrillation: Secondary | ICD-10-CM | POA: Diagnosis not present

## 2014-09-02 LAB — POCT INR: INR: 1.1

## 2014-09-09 ENCOUNTER — Ambulatory Visit (INDEPENDENT_AMBULATORY_CARE_PROVIDER_SITE_OTHER): Payer: Medicare Other

## 2014-09-09 DIAGNOSIS — Z5181 Encounter for therapeutic drug level monitoring: Secondary | ICD-10-CM | POA: Diagnosis not present

## 2014-09-09 DIAGNOSIS — I4891 Unspecified atrial fibrillation: Secondary | ICD-10-CM

## 2014-09-09 LAB — POCT INR: INR: 1.2

## 2014-09-09 MED ORDER — WARFARIN SODIUM 4 MG PO TABS: ORAL_TABLET | ORAL | Status: DC

## 2014-09-16 ENCOUNTER — Ambulatory Visit (INDEPENDENT_AMBULATORY_CARE_PROVIDER_SITE_OTHER): Payer: Medicare Other | Admitting: *Deleted

## 2014-09-16 DIAGNOSIS — I4891 Unspecified atrial fibrillation: Secondary | ICD-10-CM

## 2014-09-16 DIAGNOSIS — Z5181 Encounter for therapeutic drug level monitoring: Secondary | ICD-10-CM | POA: Diagnosis not present

## 2014-09-16 LAB — POCT INR: INR: 1.7

## 2014-09-24 ENCOUNTER — Ambulatory Visit (INDEPENDENT_AMBULATORY_CARE_PROVIDER_SITE_OTHER): Payer: Medicare Other | Admitting: Interventional Cardiology

## 2014-09-24 ENCOUNTER — Encounter: Payer: Self-pay | Admitting: Interventional Cardiology

## 2014-09-24 VITALS — BP 152/90 | HR 112 | Ht 68.0 in | Wt 240.0 lb

## 2014-09-24 DIAGNOSIS — Z7901 Long term (current) use of anticoagulants: Secondary | ICD-10-CM

## 2014-09-24 DIAGNOSIS — I482 Chronic atrial fibrillation, unspecified: Secondary | ICD-10-CM

## 2014-09-24 DIAGNOSIS — R0602 Shortness of breath: Secondary | ICD-10-CM

## 2014-09-24 DIAGNOSIS — I5033 Acute on chronic diastolic (congestive) heart failure: Secondary | ICD-10-CM | POA: Insufficient documentation

## 2014-09-24 DIAGNOSIS — R5383 Other fatigue: Secondary | ICD-10-CM

## 2014-09-24 DIAGNOSIS — I10 Essential (primary) hypertension: Secondary | ICD-10-CM

## 2014-09-24 DIAGNOSIS — I4891 Unspecified atrial fibrillation: Secondary | ICD-10-CM | POA: Diagnosis not present

## 2014-09-24 MED ORDER — FUROSEMIDE 20 MG PO TABS: 20.0000 mg | ORAL_TABLET | ORAL | Status: DC

## 2014-09-24 MED ORDER — METOPROLOL TARTRATE 50 MG PO TABS: 50.0000 mg | ORAL_TABLET | Freq: Two times a day (BID) | ORAL | Status: DC

## 2014-09-24 NOTE — Patient Instructions (Signed)
Medication Instructions:  1. START LASIX 20 MG TAKE 1 TABLET TWO DAYS A WEEK 2. INCREASE METOPROLOL TO 50 MG TWICE DAILY; NEW RX SENT IN FOR THE 50 MG TABLET  Labwork: NONE  Testing/Procedures: NONE  Follow-Up: PER DR. VARANASI WOULD LIKE FOR YOU TO FOLLOW UP IN OUR A-FIB CLINIC WITH DONNA CARROLL  Any Other Special Instructions Will Be Listed Below (If Applicable).

## 2014-09-24 NOTE — Progress Notes (Signed)
Patient ID: Charlotte Castro, female   DOB: Oct 07, 1932, 81 y.o.   MRN: [id number] Patient ID: Charlotte Castro, female   DOB: 24-Oct-1932, 82 y.o.   MRN: [id number]    [address], [address] [place], [place]  [address] Phone: [phone] Fax:  [phone]  Date:  09/24/2014   ID:  Charlotte Castro, DOB 12-Sep-1932, MRN [id number]  PCP:  Antony Blackbird, MD      History of Present Illness: Charlotte Castro is a 81 y.o. female who has had several different types of tachyarrhythmia and bradycardia which have been difficult to manage over the past few years, since 2012-13; due to other medical issues. She has had syncope and was hospitalized in 2014. We have tried to evaluate her rhythm at home to see if she has significant pauses or heart rates below 40. She does not want to wear any type of heart monitor. Her daughter came in the past, annoyed with her mothers current health condition. Daughter stated she is self-employed so she has to take time off from work every time the mother is sick. She "wanted to get to the bottom of this." She was sent back to Dr. Rayann Heman. THere was no indication for pacer. She is off of amiodarone.  She had a grey coating on her eyelid-thought to be related to Amiodarone.  The patient was seeing neurology, but she stopped. She has a tremor and had been started on Parkinson's medicines. These aparently have been stopped. The patient disagreed with the diagnosis of Parkinsons. She was seen in the emergency room for syncope in 2014. Her main complaint is feeling weak and having joint pains. She does admit to significant anxiety which happens at random times. The daughter felt that anxiety is a big component of the patient's illness, when she was here at the last visit in 2/14.  THe patient was seen by ortho and rheumatology since the last visit with me.  She took prednisone but this wsa tapered off.  She still reports no energy and fatigue.  She has had some difficulty in regulating her  INR.  Despite this, she is better than she was last year.  She is having pain in her knees.  Had a course of steroids a few months ago, but none recently.    She was noted to be tachycardic by her PMD.  Metoprolol was started at the last visit.  Palpitations are better.  Tremor persists.  She still feels tired and weak. She has chronic DOE as well. She still lives by herself.    She has held her cholesterol medicine for a week hoping this would help her sx.  She reports dry mouth.    Wt Readings from Last 3 Encounters:  09/24/14 240 lb (108.863 kg)  06/25/14 242 lb 1.6 oz (109.816 kg)  03/29/14 238 lb (107.956 kg)     Past Medical History  Diagnosis Date  . Dizziness     chronic and of an unclear etiology  . Hypothyroidism   . HTN (hypertension)   . Hyperlipemia   . Obesity   . Osteoporosis   . Gastritis   . Vitamin D deficiency   . Atrial tachycardia     ablated 11/17/10  by JA  from the [facility] of the aorta  . Atrial flutter     typical appearing  . Atrial fibrillation   . GERD (gastroesophageal reflux disease)     Current Outpatient Prescriptions  Medication Sig Dispense Refill  .  acetaminophen (TYLENOL) 325 MG tablet Take 650 mg by mouth every 4 (four) hours as needed. For headache    . acetaminophen (TYLENOL) 325 MG tablet Take 325 mg by mouth 2 (two) times daily as needed (1-2 tablets for arthritis).    . Calcium Carbonate-Vitamin D (CALCIUM-VITAMIN D) 600-200 MG-UNIT CAPS Take 1 capsule by mouth daily.     . Cholecalciferol (VITAMIN D3) 1000 UNITS CAPS Take 1 capsule by mouth daily.    . clonazePAM (KLONOPIN) 0.5 MG tablet Take 0.5 mg by mouth 2 (two) times daily as needed for anxiety.    Marland Kitchen levothyroxine (SYNTHROID, LEVOTHROID) 88 MCG tablet Take 88 mcg by mouth daily.    . metoprolol tartrate (LOPRESSOR) 25 MG tablet Take 1 tablet (25 mg total) by mouth 2 (two) times daily. 60 tablet 11  . pravastatin (PRAVACHOL) 20 MG tablet Take 20 mg by mouth daily.    Marland Kitchen warfarin  (COUMADIN) 4 MG tablet Take as directed by Coumadin clinic 50 tablet 3   No current facility-administered medications for this visit.    Allergies:    Allergies  Allergen Reactions  . Iodinated Diagnostic Agents Shortness Of Breath    headache    Social History:  The patient  reports that she has never smoked. She has never used smokeless tobacco. She reports that she does not drink alcohol or use illicit drugs.   Family History:  The patient's family history includes Heart attack in her father; Hypertension in her father.   ROS:  Please see the history of present illness.  No nausea, vomiting.  No fevers, chills.  No focal weakness.  No dysuria. No recent syncope.  Chronic pain in the knees.  She is sleeping better.  All other systems reviewed and negative.   PHYSICAL EXAM: VS:  BP 152/90 mmHg  Pulse 112  Ht 5\' 8"  (1.727 m)  Wt 240 lb (108.863 kg)  BMI 36.50 kg/m2 Well nourished, well developed, in no acute distress HEENT: normal Neck: no JVD, no carotid bruits Cardiac:  normal S1, S2; irregularly irregular Lungs:  Mild bilateral wheezing, rhonchi or rales Abd: soft, nontender, no hepatomegaly Ext: tr ankle edema Skin: warm and dry Neuro:   no focal abnormalities noted Psych: anxious affect  Hospital records form 2014 reviewed. Lasix stopped at that time due to syncope.   ASSESSMENT AND PLAN:  Tachycardia, unspecified  Notes: Some SHOB and weakness which is somewhat chronic.   HR and symptoms of palpitations are better than last visit after Starting Metoprolol 25 mg BID.  Beta blocker is usually used for tremor. Tremor better on clonazepam also.  She has not tolerated amiodarone in the past due to perceived side effects as well as some eye issues.   2. Essential hypertension, benign  Notes: Elevated here today. I would like to see readings from home which are typically in the 140/80. Check BP at home 3x/week, and call readings into our office if above 150/90.      3.  Atrial fibrillation - permanent Notes: COumadin for stroke prevention.  No falls.  She is careful to try to avoid falls.  Appears to be permanent AFIb.  Increase metoprolol to 50 mg BID.  Discussed DCCV but she is not interested.  I think medication adjustment is a better strategy given that she is somewhat frail and her AFib is now fairly longstanding.  I don't think she would maintain sinus rhythm without antiarrhythmic. She has been intolerant to multiple medications tried in the past  due to perceived side effects. She has seen Dr. Rayann Heman in the past. We'll have her follow-up in atrial fibrillation clinic for further ideas on management.  4.  Fatigue/SHOB: Persistent. Multifactorial.  I think there may be a component of acute on chronic diastolic heart failure.  Start Lasix 20 mg twice a week .  She was on daily Lasix in the past but ended up having syncope back in 2014.    5. Encounter for long-term (current) use of anticoagulants   No recent falls.  No bleeding issues recently.  Following with the PharmD.   Preventive Medicine  Adult topics discussed:  Diet: healthy diet.  Exercise: gradually increasing to, 5 days a week, at least 30 minutes of aerobic exercise once HR controlled      Signed, Mina Marble, MD, [facility] 09/24/2014 3:45 PM

## 2014-09-30 ENCOUNTER — Ambulatory Visit (INDEPENDENT_AMBULATORY_CARE_PROVIDER_SITE_OTHER): Payer: Medicare Other | Admitting: *Deleted

## 2014-09-30 DIAGNOSIS — I4891 Unspecified atrial fibrillation: Secondary | ICD-10-CM | POA: Diagnosis not present

## 2014-09-30 DIAGNOSIS — Z5181 Encounter for therapeutic drug level monitoring: Secondary | ICD-10-CM

## 2014-09-30 LAB — POCT INR: INR: 3.1

## 2014-10-14 ENCOUNTER — Ambulatory Visit (INDEPENDENT_AMBULATORY_CARE_PROVIDER_SITE_OTHER): Payer: Medicare Other | Admitting: *Deleted

## 2014-10-14 DIAGNOSIS — I4891 Unspecified atrial fibrillation: Secondary | ICD-10-CM | POA: Diagnosis not present

## 2014-10-14 DIAGNOSIS — Z5181 Encounter for therapeutic drug level monitoring: Secondary | ICD-10-CM | POA: Diagnosis not present

## 2014-10-14 LAB — POCT INR: INR: 2.8

## 2014-10-24 ENCOUNTER — Ambulatory Visit (HOSPITAL_COMMUNITY)
Admission: RE | Admit: 2014-10-24 | Discharge: 2014-10-24 | Disposition: A | Discharge status: Home / Self Care | Payer: Medicare Other | Source: Ambulatory Visit | Attending: Nurse Practitioner | Admitting: Nurse Practitioner

## 2014-10-24 ENCOUNTER — Encounter (HOSPITAL_COMMUNITY): Payer: Self-pay | Admitting: Nurse Practitioner

## 2014-10-24 VITALS — BP 150/96 | HR 87 | Ht 68.0 in | Wt 243.0 lb

## 2014-10-24 DIAGNOSIS — I482 Chronic atrial fibrillation, unspecified: Secondary | ICD-10-CM

## 2014-10-24 NOTE — Patient Instructions (Signed)
Follow up with Dr. Irish Lack in July  Afib clinic as needed [phone]

## 2014-10-24 NOTE — Progress Notes (Signed)
Patient ID: Charlotte Castro, female   DOB: 1932/09/26, 81 y.o.   MRN: [id number]    Primary Care Physician: Antony Blackbird, MD Referring Physician: Dr.Varanasi   Charlotte Castro is a 81 y.o. female with a h/o longstanding permanent afib, s/p a.tach ablation  6/12 and also failing amiodarone, for f/u in the afib clinic. She was seen by Dr. Irish Lack 4/12, and BB was increased for pt c/o's of heart racing in the am. She states this has helped. She has significant issues with arthritis of multiple joints, especially her knees and has a lot of pain and difficulty with ambulation contributing to fatigue and shortness of breath. She is compliant with warfarin with last INR 2.8.No bleeding issues. Chadsvasc at least 4. She is overweight but has difficulty being active to encourage weight loss.Her son thinks she could do better with diet to help with weight loss. Has chronic dizziness and walks with a cane but has not had any falls.  Today, she has minimal symptoms of palpitations,  No chest pain,  Chronic shortness of breath, no orthopnea, PND, lower extremity edema, chronic dizziness, presyncope, syncope, or neurologic sequela. The patient is tolerating medications without difficulties and is otherwise without complaint today.   Past Medical History  Diagnosis Date  . Dizziness     chronic and of an unclear etiology  . Hypothyroidism   . HTN (hypertension)   . Hyperlipemia   . Obesity   . Osteoporosis   . Gastritis   . Vitamin D deficiency   . Atrial tachycardia     ablated 11/17/10  by JA  from the [facility] of the aorta  . Atrial flutter     typical appearing  . Atrial fibrillation   . GERD (gastroesophageal reflux disease)    Past Surgical History  Procedure Laterality Date  . Atrial ablation surgery  11/17/10    Atrial tachycardia arising from [facility] of the aorta ablated by JA    Current Outpatient Prescriptions  Medication Sig Dispense Refill  . acetaminophen (TYLENOL) 325 MG tablet Take 650 mg by  mouth every 4 (four) hours as needed. For headache    . acetaminophen (TYLENOL) 325 MG tablet Take 325 mg by mouth 2 (two) times daily as needed (1-2 tablets for arthritis).    . Calcium Carbonate-Vitamin D (CALCIUM-VITAMIN D) 600-200 MG-UNIT CAPS Take 1 capsule by mouth daily.     . Cholecalciferol (VITAMIN D3) 1000 UNITS CAPS Take 1 capsule by mouth daily.    . clonazePAM (KLONOPIN) 0.5 MG tablet Take 0.5 mg by mouth 2 (two) times daily as needed for anxiety.    . furosemide (LASIX) 20 MG tablet Take 1 tablet (20 mg total) by mouth as directed. TAKE 20 MG 2 DAYS A WEEK 30 tablet 2  . levothyroxine (SYNTHROID, LEVOTHROID) 88 MCG tablet Take 88 mcg by mouth daily.    . metoprolol tartrate (LOPRESSOR) 50 MG tablet Take 1 tablet (50 mg total) by mouth 2 (two) times daily. 60 tablet 11  . pravastatin (PRAVACHOL) 20 MG tablet Take 20 mg by mouth daily.    Marland Kitchen warfarin (COUMADIN) 4 MG tablet Take as directed by Coumadin clinic 50 tablet 3   No current facility-administered medications for this encounter.    Allergies  Allergen Reactions  . Iodinated Diagnostic Agents Shortness Of Breath    headache    History   Social History  . Marital Status: Single    Spouse Name: N/A  . Number of Children: N/A  .  Years of Education: N/A   Occupational History  . Not on file.   Social History Main Topics  . Smoking status: Never Smoker   . Smokeless tobacco: Never Used  . Alcohol Use: No  . Drug Use: No  . Sexual Activity: Not on file   Other Topics Concern  . Not on file   Social History Narrative    Family History  Problem Relation Age of Onset  . Heart attack Father   . Hypertension Father     ROS- All systems are reviewed and negative except as per the HPI above  Physical Exam: Filed Vitals:   10/24/14 0859  BP: 150/96  Pulse: 87  Height: 5\' 8"  (1.727 m)  Weight: 243 lb (110.224 kg)    GEN- The patient is well appearing, alert and oriented x 3 today.   Head-  normocephalic, atraumatic Eyes-  Sclera clear, conjunctiva pink Ears- hearing intact Oropharynx- clear Neck- supple, no JVP Lymph- no cervical lymphadenopathy Lungs- Clear to ausculation bilaterally, normal work of breathing Heart- Irregular rate and rhythm, no murmurs, rubs or gallops, PMI not laterally displaced GI- soft, NT, ND, + BS Extremities- no clubbing, cyanosis, or edema MS- no significant deformity or atrophy Skin- no rash or lesion Psych- euthymic mood, full affect Neuro- strength and sensation are intact  EKG- Atrial fibrillation, 87 bpm, NST wave abnormality   Assessment and Plan: 1. Longstanding permanent afib Currently well rate controlled and stroke risk addressed with warfarin. Options are limited  for further rate control or rhythm control.  She currently has fatigue so would not go up further on BB if able to avoid. She is satisfied with management of afib currently. She has other medical issues that also affect her quality of life.  No change suggested today.  2. HTN  Elevated today, but she states at home around 630 systolic. Avoid salt.  3. Balance issues Continue careful ambulation, use of cane.  F/u with Dr. Irish Lack as scheduled.

## 2014-11-04 ENCOUNTER — Ambulatory Visit (INDEPENDENT_AMBULATORY_CARE_PROVIDER_SITE_OTHER): Payer: Medicare Other

## 2014-11-04 DIAGNOSIS — I482 Chronic atrial fibrillation, unspecified: Secondary | ICD-10-CM

## 2014-11-04 DIAGNOSIS — Z5181 Encounter for therapeutic drug level monitoring: Secondary | ICD-10-CM | POA: Diagnosis not present

## 2014-11-04 DIAGNOSIS — I4891 Unspecified atrial fibrillation: Secondary | ICD-10-CM

## 2014-11-04 LAB — POCT INR: INR: 2.9

## 2014-12-02 ENCOUNTER — Ambulatory Visit (INDEPENDENT_AMBULATORY_CARE_PROVIDER_SITE_OTHER): Payer: Medicare Other | Admitting: *Deleted

## 2014-12-02 DIAGNOSIS — I4891 Unspecified atrial fibrillation: Secondary | ICD-10-CM

## 2014-12-02 DIAGNOSIS — I482 Chronic atrial fibrillation, unspecified: Secondary | ICD-10-CM

## 2014-12-02 DIAGNOSIS — Z5181 Encounter for therapeutic drug level monitoring: Secondary | ICD-10-CM

## 2014-12-02 LAB — POCT INR: INR: 4

## 2014-12-17 ENCOUNTER — Ambulatory Visit (INDEPENDENT_AMBULATORY_CARE_PROVIDER_SITE_OTHER): Payer: Medicare Other | Admitting: *Deleted

## 2014-12-17 DIAGNOSIS — I4891 Unspecified atrial fibrillation: Secondary | ICD-10-CM | POA: Diagnosis not present

## 2014-12-17 DIAGNOSIS — I482 Chronic atrial fibrillation, unspecified: Secondary | ICD-10-CM

## 2014-12-17 DIAGNOSIS — Z5181 Encounter for therapeutic drug level monitoring: Secondary | ICD-10-CM | POA: Diagnosis not present

## 2014-12-17 LAB — POCT INR: INR: 3

## 2015-01-01 ENCOUNTER — Ambulatory Visit (INDEPENDENT_AMBULATORY_CARE_PROVIDER_SITE_OTHER): Payer: Medicare Other | Admitting: *Deleted

## 2015-01-01 DIAGNOSIS — Z5181 Encounter for therapeutic drug level monitoring: Secondary | ICD-10-CM | POA: Diagnosis not present

## 2015-01-01 DIAGNOSIS — I4891 Unspecified atrial fibrillation: Secondary | ICD-10-CM

## 2015-01-01 DIAGNOSIS — I482 Chronic atrial fibrillation, unspecified: Secondary | ICD-10-CM

## 2015-01-01 LAB — POCT INR: INR: 2.7

## 2015-01-22 ENCOUNTER — Ambulatory Visit (INDEPENDENT_AMBULATORY_CARE_PROVIDER_SITE_OTHER): Payer: Medicare Other | Admitting: *Deleted

## 2015-01-22 DIAGNOSIS — I4891 Unspecified atrial fibrillation: Secondary | ICD-10-CM

## 2015-01-22 DIAGNOSIS — I482 Chronic atrial fibrillation, unspecified: Secondary | ICD-10-CM

## 2015-01-22 DIAGNOSIS — Z5181 Encounter for therapeutic drug level monitoring: Secondary | ICD-10-CM

## 2015-01-22 LAB — POCT INR: INR: 4.5

## 2015-01-24 ENCOUNTER — Other Ambulatory Visit: Payer: Self-pay | Admitting: Interventional Cardiology

## 2015-02-06 ENCOUNTER — Encounter: Payer: Self-pay | Admitting: Interventional Cardiology

## 2015-02-06 ENCOUNTER — Ambulatory Visit (INDEPENDENT_AMBULATORY_CARE_PROVIDER_SITE_OTHER): Payer: Medicare Other | Admitting: Interventional Cardiology

## 2015-02-06 ENCOUNTER — Ambulatory Visit (INDEPENDENT_AMBULATORY_CARE_PROVIDER_SITE_OTHER): Payer: Medicare Other | Admitting: *Deleted

## 2015-02-06 VITALS — BP 140/80 | HR 96 | Ht 68.0 in | Wt 238.4 lb

## 2015-02-06 DIAGNOSIS — Z7901 Long term (current) use of anticoagulants: Secondary | ICD-10-CM

## 2015-02-06 DIAGNOSIS — I482 Chronic atrial fibrillation, unspecified: Secondary | ICD-10-CM

## 2015-02-06 DIAGNOSIS — I4891 Unspecified atrial fibrillation: Secondary | ICD-10-CM | POA: Diagnosis not present

## 2015-02-06 DIAGNOSIS — Z5181 Encounter for therapeutic drug level monitoring: Secondary | ICD-10-CM

## 2015-02-06 DIAGNOSIS — R5383 Other fatigue: Secondary | ICD-10-CM | POA: Diagnosis not present

## 2015-02-06 LAB — BASIC METABOLIC PANEL
BUN: 17 mg/dL (ref 6–23)
CHLORIDE: 105 meq/L (ref 96–112)
CO2: 29 meq/L (ref 19–32)
Calcium: 9.9 mg/dL (ref 8.4–10.5)
Creatinine, Ser: 0.85 mg/dL (ref 0.40–1.20)
GFR: 68.38 mL/min (ref >60.00)
Glucose, Bld: 94 mg/dL (ref 70–99)
Potassium: 4.3 mEq/L (ref 3.5–5.1)
SODIUM: 141 meq/L (ref 135–145)

## 2015-02-06 LAB — CBC
HCT: 44.6 % (ref 36.0–46.0)
Hemoglobin: 15.2 g/dL — ABNORMAL HIGH (ref 12.0–15.0)
MCHC: 34.1 g/dL (ref 30.0–36.0)
MCV: 95.1 fl (ref 78.0–100.0)
Platelets: 192 10*3/uL (ref 150.0–400.0)
RBC: 4.69 Mil/uL (ref 3.87–5.11)
RDW: 14.9 % (ref 11.5–15.5)
WBC: 9.2 10*3/uL (ref 4.0–10.5)

## 2015-02-06 LAB — POCT INR: INR: 3.2

## 2015-02-06 MED ORDER — APIXABAN 5 MG PO TABS: 5.0000 mg | ORAL_TABLET | Freq: Two times a day (BID) | ORAL | Status: DC

## 2015-02-06 NOTE — Patient Instructions (Signed)
**Note De-Identified  Obfuscation** Medication Instructions:  Stop taking Coumadin and start taking Eliquis at dose recommended by Pharm D in Coumadin Clinic  Labwork: None  Testing/Procedures: None  Follow-Up: Your physician wants you to follow-up in: 6 months. You will receive a reminder letter in the mail two months in advance. If you don't receive a letter, please call our office to schedule the follow-up appointment.

## 2015-02-06 NOTE — Patient Instructions (Addendum)
Pt was started on Eliquis 5mg  every 12 hours  for Atrial Fib  Will begin on Saturday August 27tth 2016.    Reviewed patients medication list.  Pt is not currently on any combined P-gp and strong CYP3A4 inhibitors/inducers (ketoconazole, traconazole, ritonavir, carbamazepine, phenytoin, rifampin, St. John's wort).  Reviewed labs.  SCr  0.85 , Weight  108.138kg   Dose is appropriate based on labs weight and age   Hgb 15.2 and HCT 44.6  A full discussion of the nature of anticoagulants has been carried out.  A benefit/risk analysis has been presented to the patient, so that they understand the justification for choosing anticoagulation with Eliquis at this time.  The need for compliance is stressed.  Pt is aware to take the medication twice daily.  Side effects of potential bleeding are discussed, including unusual colored urine or stools, coughing up blood or coffee ground emesis, nose bleeds or serious fall or head trauma.  Discussed signs and symptoms of stroke. The patient should avoid any OTC items containing aspirin or ibuprofen.  Avoid alcohol consumption.   Call if any signs of abnormal bleeding.  Discussed financial obligations and pt   will check with her insurance and will call if has any problems Will call pt  with results of lab work.  Next lab test test in 1 month. 02/06/2015. Spoke with pt on 02/06/15 and instructed that labs are within normal limits for Eliquis 5mg  every 12 hours and made an appt for her to be seen in one month and pt states understanding

## 2015-02-06 NOTE — Progress Notes (Signed)
Patient ID: Charlotte Castro, female   DOB: Aug 16, 1932, 81 y.o.   MRN: [id number]     Cardiology Office Note   Date:  02/06/2015   ID:  Charlotte Castro, DOB [birth year], MRN [id number]  PCP:  Antony Blackbird, MD    No chief complaint on file. f/u AFib   Wt Readings from Last 3 Encounters:  02/06/15 238 lb 6.4 oz (108.138 kg)  10/24/14 243 lb (110.224 kg)  09/24/14 240 lb (108.863 kg)       History of Present Illness: Charlotte Castro is a 81 y.o. female  who has had several different types of tachyarrhythmia and bradycardia which have been difficult to manage over the past few years, since 2012-13; due to other medical issues. She has had syncope and was hospitalized in 2014. We have tried to evaluate her rhythm at home to see if she has significant pauses or heart rates below 40. She does not want to wear any type of heart monitor. Her daughter came in the past, annoyed with her mothers current health condition. Daughter stated she is self-employed so she has to take time off from work every time the mother is sick. She "wanted to get to the bottom of this." She was sent back to Dr. Rayann Heman. THere was no indication for pacer. She is off of amiodarone. She had a grey coating on her eyelid-thought to be related to Amiodarone.  The patient was seeing neurology, but she stopped. She has a tremor and had been started on Parkinson's medicines. These aparently have been stopped. The patient disagreed with the diagnosis of Parkinsons. She was seen in the emergency room for syncope in 2014. Her main complaint is feeling weak and having joint pains. She does admit to significant anxiety which happens at random times. The daughter felt that anxiety is a big component of the patient's illness, when she was here at the last visit in 2/14.  THe patient was seen by ortho and rheumatology in the past. She took prednisone but this wsa tapered off. She still reports no energy and fatigue. She has had some  difficulty in regulating her INR. She is willing to try Eliquis but not Xarelto.  She heard bad things about Xarelto.    Past Medical History  Diagnosis Date  . Dizziness     chronic and of an unclear etiology  . Hypothyroidism   . HTN (hypertension)   . Hyperlipemia   . Obesity   . Osteoporosis   . Gastritis   . Vitamin D deficiency   . Atrial tachycardia     ablated 11/17/10  by JA  from the [facility] of the aorta  . Atrial flutter     typical appearing  . Atrial fibrillation   . GERD (gastroesophageal reflux disease)     Past Surgical History  Procedure Laterality Date  . Atrial ablation surgery  11/17/10    Atrial tachycardia arising from [facility] of the aorta ablated by JA     Current Outpatient Prescriptions  Medication Sig Dispense Refill  . acetaminophen (TYLENOL) 325 MG tablet Take 650 mg by mouth every 4 (four) hours as needed. For headache    . acetaminophen (TYLENOL) 325 MG tablet Take 325 mg by mouth 2 (two) times daily as needed (1-2 tablets for arthritis).    . Calcium Carbonate-Vitamin D (CALCIUM-VITAMIN D) 600-200 MG-UNIT CAPS Take 1 capsule by mouth daily.     . Cholecalciferol (VITAMIN D3) 1000 UNITS CAPS Take 1 capsule by  mouth daily.    . clonazePAM (KLONOPIN) 0.5 MG tablet Take 0.5 mg by mouth 2 (two) times daily as needed for anxiety.    . furosemide (LASIX) 20 MG tablet Take 1 tablet (20 mg total) by mouth as directed. TAKE 20 MG 2 DAYS A WEEK 30 tablet 2  . levothyroxine (SYNTHROID, LEVOTHROID) 88 MCG tablet Take 88 mcg by mouth daily.    . metoprolol tartrate (LOPRESSOR) 50 MG tablet Take 1 tablet (50 mg total) by mouth 2 (two) times daily. 60 tablet 11  . warfarin (COUMADIN) 4 MG tablet TAKE AS DIRECTED BY COUMADIN CLINIC 50 tablet 3   No current facility-administered medications for this visit.    Allergies:   Iodinated diagnostic agents    Social History:  The patient  reports that she has never smoked. She has never used smokeless tobacco. She reports  that she does not drink alcohol or use illicit drugs.   Family History:  The patient's family history includes Heart attack in her father; Hypertension in her father.    ROS:  Please see the history of present illness.   Otherwise, review of systems are positive for weakness, fatigue.   All other systems are reviewed and negative.    PHYSICAL EXAM: VS:  BP 140/80 mmHg  Pulse 96  Ht 5\' 8"  (1.727 m)  Wt 238 lb 6.4 oz (108.138 kg)  BMI 36.26 kg/m2  SpO2 97% , BMI Body mass index is 36.26 kg/(m^2). GEN: Well nourished, well developed, in no acute distress HEENT: normal Neck: no JVD, carotid bruits, or masses Cardiac: irregularly irregular; no murmurs, rubs, or gallops, tr bilateral edema  Respiratory:  clear to auscultation bilaterally, normal work of breathing GI: soft, nontender, nondistended, + BS MS: no deformity or atrophy Skin: warm and dry, no rash Neuro:  Strength and sensation are intact Psych: euthymic mood, full affect   Recent Labs: No results found for requested labs within last 365 days.   Lipid Panel No results found for: CHOL, TRIG, HDL, CHOLHDL, VLDL, LDLCALC, LDLDIRECT   Other studies Reviewed: Additional studies/ records that were reviewed today with results demonstrating: Prior cath showing no CAD.  Normal renal function.   ASSESSMENT AND PLAN:  1. AFib:  Seen in th eAFib clinic.  She did not feel that they did anything significant and was somewhat dissatisfied with the inconvenience. No changes made.  I do not think her generalized fatigue is coming from the AFib. 2. Chronic fatigue sx. Going on for years.  No CAD by cath.  AFib controlled. I don't think this is cardiac. 3. HTN: BP controlled 4. Change COumadin to Eliquis.  SHe will see the PharmD today.  They will coordinate her switch to Eliquis.  I suspect that she will need 5 mg BID.  At least she would not need more visits for COumadin checks.   Current medicines are reviewed at length with the  patient today.  The patient concerns regarding her medicines were addressed.  The following changes have been made:  Stop COumadin. Start Eliquis  Labs/ tests ordered today include: INR; Cr 1.0 in 6/16. No orders of the defined types were placed in this encounter.    Recommend 150 minutes/week of aerobic exercise Low fat, low carb, high fiber diet recommended  Disposition:   FU in 6 months   Teresita Madura., MD  02/06/2015 9:16 AM    [facility] [place], [place], [place]  [address] Phone: [phone];  Fax: [phone]

## 2015-02-24 ENCOUNTER — Telehealth: Payer: Self-pay | Admitting: Interventional Cardiology

## 2015-02-24 NOTE — Telephone Encounter (Signed)
Left message to call back  

## 2015-02-24 NOTE — Telephone Encounter (Signed)
New message      Daughter want you to call pt and see how she is feeling.  Daughter saw pt on Saturday and she was still dizzy. She is dizzy walking from room to room.  Daughter says this has been going on too long.  Daughter does not want pt to know she called

## 2015-02-25 NOTE — Telephone Encounter (Signed)
Finally able to get phone to ring.  No answer.  Left message to call back.

## 2015-02-25 NOTE — Telephone Encounter (Signed)
Attempted to call pt multiple times this morning and phone line has been busy.

## 2015-02-26 NOTE — Telephone Encounter (Addendum)
**Note De-Identified  Obfuscation** The pt states that for months she has been weak, having difficulty walking, having sweats then feels clammy and cold and at times feels like she is going to pass out. She states that she has discussed her s/s with all of her MDs and that no one seems to know why she feels this way. She states that Eliquis maybe the cause of her s/s. She wants to wait until her Coumadin appt on 9/19 to discuss her s/s with the Coumadin nurses as she states they have been working with her. She is advised to contact her PCP to let them know that she is continuing to have these problems as this may not be related to Eliquis. Also, she is advised to call 911 or have someone drive her to the closest ER or urgent care if her s/s persist or worsen. She verbalized understanding and is in agreement with plan.  Will forward message to Dr Irish Lack as Juluis Rainier.

## 2015-02-26 NOTE — Telephone Encounter (Signed)
Follow up ° ° °Pt returning your call °

## 2015-03-03 ENCOUNTER — Ambulatory Visit (INDEPENDENT_AMBULATORY_CARE_PROVIDER_SITE_OTHER): Payer: Medicare Other | Admitting: Pharmacist

## 2015-03-03 DIAGNOSIS — I4891 Unspecified atrial fibrillation: Secondary | ICD-10-CM | POA: Diagnosis not present

## 2015-03-03 LAB — CBC
HEMATOCRIT: 43.7 % (ref 36.0–46.0)
Hemoglobin: 14.6 g/dL (ref 12.0–15.0)
MCHC: 33.5 g/dL (ref 30.0–36.0)
MCV: 97.8 fl (ref 78.0–100.0)
PLATELETS: 209 10*3/uL (ref 150.0–400.0)
RBC: 4.46 Mil/uL (ref 3.87–5.11)
RDW: 14.4 % (ref 11.5–15.5)
WBC: 8.5 10*3/uL (ref 4.0–10.5)

## 2015-03-03 LAB — BASIC METABOLIC PANEL
BUN: 17 mg/dL (ref 6–23)
CHLORIDE: 108 meq/L (ref 96–112)
CO2: 25 mEq/L (ref 19–32)
Calcium: 9.8 mg/dL (ref 8.4–10.5)
Creatinine, Ser: 0.98 mg/dL (ref 0.40–1.20)
GFR: 58.01 mL/min — AB (ref >60.00)
Glucose, Bld: 112 mg/dL — ABNORMAL HIGH (ref 70–99)
Potassium: 3.9 mEq/L (ref 3.5–5.1)
SODIUM: 141 meq/L (ref 135–145)

## 2015-03-03 NOTE — Progress Notes (Signed)
Pt was started on Eliquis 5mg  BID for atrial fibrillation on February 08, 2015.    Reviewed patients medication list.  Pt is not currently on any combined P-gp and strong CYP3A4 inhibitors/inducers (ketoconazole, traconazole, ritonavir, carbamazepine, phenytoin, rifampin, St. John's wort).  Reviewed labs.  SCr 0.98, Weight 231 lbs , CrCl 76.  Dose appropriate based on SCr, age, and weight.   Hgb and HCT 14.6/43.7, both stable and wnl.  A full discussion of the nature of anticoagulants has been carried out.  A benefit/risk analysis has been presented to the patient, so that they understand the justification for choosing anticoagulation with Eliquis at this time.  The need for compliance is stressed.  Pt is aware to take the medication twice daily.  Side effects of potential bleeding are discussed, including unusual colored urine or stools, coughing up blood or coffee ground emesis, nose bleeds or serious fall or head trauma.  Discussed signs and symptoms of stroke. The patient should avoid any OTC items containing aspirin or ibuprofen. Avoid alcohol consumption.  Call if any signs of abnormal bleeding. Discussed financial obligations and resolved any difficulty in obtaining medication.  Next lab test test in 6 months.

## 2015-05-13 ENCOUNTER — Encounter: Payer: Self-pay | Admitting: Interventional Cardiology

## 2015-06-30 ENCOUNTER — Telehealth: Payer: Self-pay | Admitting: Interventional Cardiology

## 2015-06-30 NOTE — Telephone Encounter (Signed)
**Note De-Identified  Obfuscation** The pt is requesting a sooner apt than 2/23 with Dr Irish Lack as she wants to discuss alternatives to Eliquis as Eliquis is to expensive. Apt. rescheduled for 1/19 at 8:15 and the pt is in agreement with date and time of apt.

## 2015-06-30 NOTE — Telephone Encounter (Signed)
New Message   Pt states she needs a new card for her medication Eliquis 5mg  2x day  But since Dr. Irish Lack changed her to a new medication they will not fill it  She wants a different prescription if she cant get a new discount medication card

## 2015-07-03 ENCOUNTER — Encounter: Payer: Self-pay | Admitting: Interventional Cardiology

## 2015-07-03 ENCOUNTER — Ambulatory Visit (INDEPENDENT_AMBULATORY_CARE_PROVIDER_SITE_OTHER): Payer: Medicare Other | Admitting: Interventional Cardiology

## 2015-07-03 VITALS — BP 154/82 | HR 72 | Ht 68.0 in | Wt 226.8 lb

## 2015-07-03 DIAGNOSIS — I482 Chronic atrial fibrillation, unspecified: Secondary | ICD-10-CM

## 2015-07-03 DIAGNOSIS — I10 Essential (primary) hypertension: Secondary | ICD-10-CM

## 2015-07-03 DIAGNOSIS — Z7901 Long term (current) use of anticoagulants: Secondary | ICD-10-CM

## 2015-07-03 DIAGNOSIS — I4892 Unspecified atrial flutter: Secondary | ICD-10-CM

## 2015-07-03 MED ORDER — WARFARIN SODIUM 5 MG PO TABS: 5.0000 mg | ORAL_TABLET | Freq: Every day | ORAL | Status: DC

## 2015-07-03 NOTE — Patient Instructions (Addendum)
**Note De-Identified  Obfuscation** Medication Instructions:  Stop taking Eliquis and start taking Coumadin 5 mg daily. For the first 3 days you will take both Coumadin and Eliquis doses. On the fourth day stop Eliquis and only take Coumadin 5 mg daily. All other medications remain the same.  Labwork: None  Testing/Procedures: None  Follow-Up: Your physician wants you to follow-up in: 6 months. You will receive a reminder letter in the mail two months in advance. If you don't receive a letter, please call our office to schedule the follow-up appointment.      If you need a refill on your cardiac medications before your next appointment, please call your pharmacy.

## 2015-07-03 NOTE — Progress Notes (Signed)
Patient ID: Charlotte Castro, female   DOB: May 08, 1933, 82 y.o.   MRN: [id number] Patient ID: Charlotte Castro, female   DOB: Apr 29, 1933, 82 y.o.   MRN: [id number]     Cardiology Office Note   Date:  07/03/2015   ID:  Charlotte Castro, DOB 06/14/1933, MRN [id number]  PCP:  Antony Blackbird, MD    No chief complaint on file. f/u AFib   Wt Readings from Last 3 Encounters:  07/03/15 226 lb 12.8 oz (102.876 kg)  03/03/15 231 lb (104.781 kg)  02/06/15 238 lb 6.4 oz (108.138 kg)       History of Present Illness: Charlotte Castro is a 82 y.o. female  who has had several different types of tachyarrhythmia and bradycardia which have been difficult to manage over the past few years, since 2012-13; due to other medical issues. She has had syncope and was hospitalized in 2014. We have tried to evaluate her rhythm at home to see if she has significant pauses or heart rates below 40. She does not want to wear any type of heart monitor. Her daughter came in the past, annoyed with her mothers current health condition. Daughter stated she is self-employed so she has to take time off from work every time the mother is sick. She "wanted to get to the bottom of this." She was sent back to Dr. Rayann Heman. THere was no indication for pacer. She is off of amiodarone. She had a grey coating on her eyelid-thought to be related to Amiodarone.  The patient was seeing neurology, but she stopped. She has a tremor and had been started on Parkinson's medicines. These aparently have been stopped. The patient disagreed with the diagnosis of Parkinsons. She was seen in the emergency room for syncope in 2014. Her main complaint is feeling weak and having joint pains. She does admit to significant anxiety which happens at random times. The daughter felt that anxiety is a big component of the patient's illness, when she was here at the last visit in 2/14.  THe patient was seen by ortho and rheumatology in the past. She took prednisone but this  was tapered off. She still reports no energy and fatigue. She has had some difficulty in regulating her INR. She is willing to try Eliquis but not Xarelto.  She heard bad things about Xarelto.  Since the last visit, she reports having problems getting her Eliquis.  It apparently is not covered and would be very expensive.  SHe wants to know what other alternative.  No significant bleeding problems.  She had some mild hematuria which has apparently resolved.   She is having to have a form filled out to get her license renewed. She has a form with her today. She only drives locally. She denies any lightheadedness or passing out. No chest pain. No palpitations.    Past Medical History  Diagnosis Date  . Dizziness     chronic and of an unclear etiology  . Hypothyroidism   . HTN (hypertension)   . Hyperlipemia   . Obesity   . Osteoporosis   . Gastritis   . Vitamin D deficiency   . Atrial tachycardia ([place])     ablated 11/17/10  by JA  from the [facility] of the aorta  . Atrial flutter ([place])     typical appearing  . Atrial fibrillation ([place])   . GERD (gastroesophageal reflux disease)     Past Surgical History  Procedure Laterality Date  . Atrial ablation  surgery  11/17/10    Atrial tachycardia arising from [facility] of the aorta ablated by JA     Current Outpatient Prescriptions  Medication Sig Dispense Refill  . acetaminophen (TYLENOL) 325 MG tablet Take 650 mg by mouth every 4 (four) hours as needed. For headache    . acetaminophen (TYLENOL) 325 MG tablet Take 325 mg by mouth 2 (two) times daily as needed (1-2 tablets for arthritis).    Marland Kitchen apixaban (ELIQUIS) 5 MG TABS tablet Take 1 tablet (5 mg total) by mouth 2 (two) times daily. 60 tablet 11  . Calcium Carbonate-Vitamin D (CALCIUM-VITAMIN D) 600-200 MG-UNIT CAPS Take 1 capsule by mouth daily.     . Cholecalciferol (VITAMIN D3) 1000 UNITS CAPS Take 1 capsule by mouth daily.    . clonazePAM (KLONOPIN) 0.5 MG tablet Take 0.5 mg by mouth 2 (two)  times daily as needed for anxiety.    . furosemide (LASIX) 20 MG tablet Take 1 tablet (20 mg total) by mouth as directed. TAKE 20 MG 2 DAYS A WEEK 30 tablet 2  . levothyroxine (SYNTHROID, LEVOTHROID) 88 MCG tablet Take 88 mcg by mouth daily.    . metoprolol tartrate (LOPRESSOR) 50 MG tablet Take 1 tablet (50 mg total) by mouth 2 (two) times daily. 60 tablet 11   No current facility-administered medications for this visit.    Allergies:   Iodinated diagnostic agents    Social History:  The patient  reports that she has never smoked. She has never used smokeless tobacco. She reports that she does not drink alcohol or use illicit drugs.   Family History:  The patient's family history includes Heart attack in her father; Hypertension in her father.    ROS:  Please see the history of present illness.   Otherwise, review of systems are positive for weakness, fatigue.   All other systems are reviewed and negative.    PHYSICAL EXAM: VS:  BP 154/82 mmHg  Pulse 72  Ht 5\' 8"  (1.727 m)  Wt 226 lb 12.8 oz (102.876 kg)  BMI 34.49 kg/m2 , BMI Body mass index is 34.49 kg/(m^2). GEN: Well nourished, well developed, in no acute distress HEENT: normal Neck: no JVD, carotid bruits, or masses Cardiac: irregularly irregular; no murmurs, rubs, or gallops, tr bilateral edema  Respiratory:  clear to auscultation bilaterally, normal work of breathing GI: soft, nontender, nondistended, + BS MS: no deformity or atrophy Skin: warm and dry, no rash Neuro:  Strength and sensation are intact Psych: euthymic mood, full affect   Recent Labs: 03/03/2015: BUN 17; Creatinine, Ser 0.98; Hemoglobin 14.6; Platelets 209.0; Potassium 3.9; Sodium 141   Lipid Panel No results found for: CHOL, TRIG, HDL, CHOLHDL, VLDL, LDLCALC, LDLDIRECT   Other studies Reviewed: Additional studies/ records that were reviewed today with results demonstrating: Prior cath showing no CAD.  Normal renal function.  ECG: Rate controlled  AFib   ASSESSMENT AND PLAN:  1. AFib:  Seen in th eAFib clinic in 2016.  Has had other arrhythmias in the past.  She did not feel that they did anything significant and was somewhat dissatisfied with the inconvenience. No changes made. Rate cotrolled today.  No palpitations. 2. Chronic fatigue sx. Going on for years.  No CAD by cath.  AFib controlled. I don't think this is cardiac. 3. HTN: BP controlled. Continue current medicines and lifestyle modifications. Readings elevated today, but usually controlled. 4. Changed COumadin to Eliquis.    Cost is an issue.  Copay card no longer  accepted.  SHe will see the PharmD today.  They will coordinate her switch back to Coumadin and more visits for COumadin checks. 5. DMV form filled out.  She does not have CHF, pacer, AICD or CAD.  Only rate controlled AFib.  NYHA II.  Biggest limitations are from knee problems and tremor.  She will f/u with PMD.   Current medicines are reviewed at length with the patient today.  The patient concerns regarding her medicines were addressed.  The following changes have been made:  Stop COumadin. Start Eliquis  Labs/ tests ordered today include: INR; Cr 1.0 in 6/16. No orders of the defined types were placed in this encounter.    Recommend 150 minutes/week of aerobic exercise Low fat, low carb, high fiber diet recommended  Disposition:   FU in 6 months   Teresita Madura., MD  07/03/2015 8:39 AM    [facility] [address], [place], [place]  [address] Phone: [phone]; Fax: [phone]

## 2015-07-03 NOTE — Addendum Note (Signed)
**Note De-Identified Rowland Ericsson Obfuscation** Addended by: Dennie Fetters on: 07/03/2015 10:00 AM   Modules accepted: Orders, Medications

## 2015-07-09 ENCOUNTER — Encounter (HOSPITAL_COMMUNITY): Payer: Self-pay | Admitting: Emergency Medicine

## 2015-07-09 ENCOUNTER — Emergency Department (HOSPITAL_COMMUNITY)
Admission: EM | Admit: 2015-07-09 | Discharge: 2015-07-09 | Disposition: A | Discharge status: Home / Self Care | Payer: Medicare Other | Attending: Emergency Medicine | Admitting: Emergency Medicine

## 2015-07-09 ENCOUNTER — Emergency Department (HOSPITAL_COMMUNITY): Payer: Medicare Other

## 2015-07-09 ENCOUNTER — Ambulatory Visit (INDEPENDENT_AMBULATORY_CARE_PROVIDER_SITE_OTHER): Payer: Medicare Other | Admitting: *Deleted

## 2015-07-09 DIAGNOSIS — E669 Obesity, unspecified: Secondary | ICD-10-CM | POA: Insufficient documentation

## 2015-07-09 DIAGNOSIS — E559 Vitamin D deficiency, unspecified: Secondary | ICD-10-CM | POA: Insufficient documentation

## 2015-07-09 DIAGNOSIS — M25562 Pain in left knee: Secondary | ICD-10-CM | POA: Diagnosis not present

## 2015-07-09 DIAGNOSIS — M810 Age-related osteoporosis without current pathological fracture: Secondary | ICD-10-CM | POA: Insufficient documentation

## 2015-07-09 DIAGNOSIS — E039 Hypothyroidism, unspecified: Secondary | ICD-10-CM | POA: Diagnosis not present

## 2015-07-09 DIAGNOSIS — K219 Gastro-esophageal reflux disease without esophagitis: Secondary | ICD-10-CM | POA: Diagnosis not present

## 2015-07-09 DIAGNOSIS — I4892 Unspecified atrial flutter: Secondary | ICD-10-CM | POA: Diagnosis not present

## 2015-07-09 DIAGNOSIS — Z5181 Encounter for therapeutic drug level monitoring: Secondary | ICD-10-CM

## 2015-07-09 DIAGNOSIS — I4891 Unspecified atrial fibrillation: Secondary | ICD-10-CM | POA: Insufficient documentation

## 2015-07-09 DIAGNOSIS — Z79899 Other long term (current) drug therapy: Secondary | ICD-10-CM | POA: Diagnosis not present

## 2015-07-09 DIAGNOSIS — R531 Weakness: Secondary | ICD-10-CM | POA: Diagnosis present

## 2015-07-09 DIAGNOSIS — Z7901 Long term (current) use of anticoagulants: Secondary | ICD-10-CM | POA: Diagnosis not present

## 2015-07-09 DIAGNOSIS — I482 Chronic atrial fibrillation, unspecified: Secondary | ICD-10-CM

## 2015-07-09 DIAGNOSIS — G8929 Other chronic pain: Secondary | ICD-10-CM | POA: Diagnosis not present

## 2015-07-09 LAB — POCT INR: INR: 2.8

## 2015-07-09 IMAGING — DX DG KNEE COMPLETE 4+V*L*
4 series · 4 of 4 positions shown · non-contrast
Comparison: Left knee MRI [DATE]

CLINICAL DATA: Pain after fall

EXAM:
LEFT KNEE - COMPLETE 4+ VIEW

[x knee ap left]
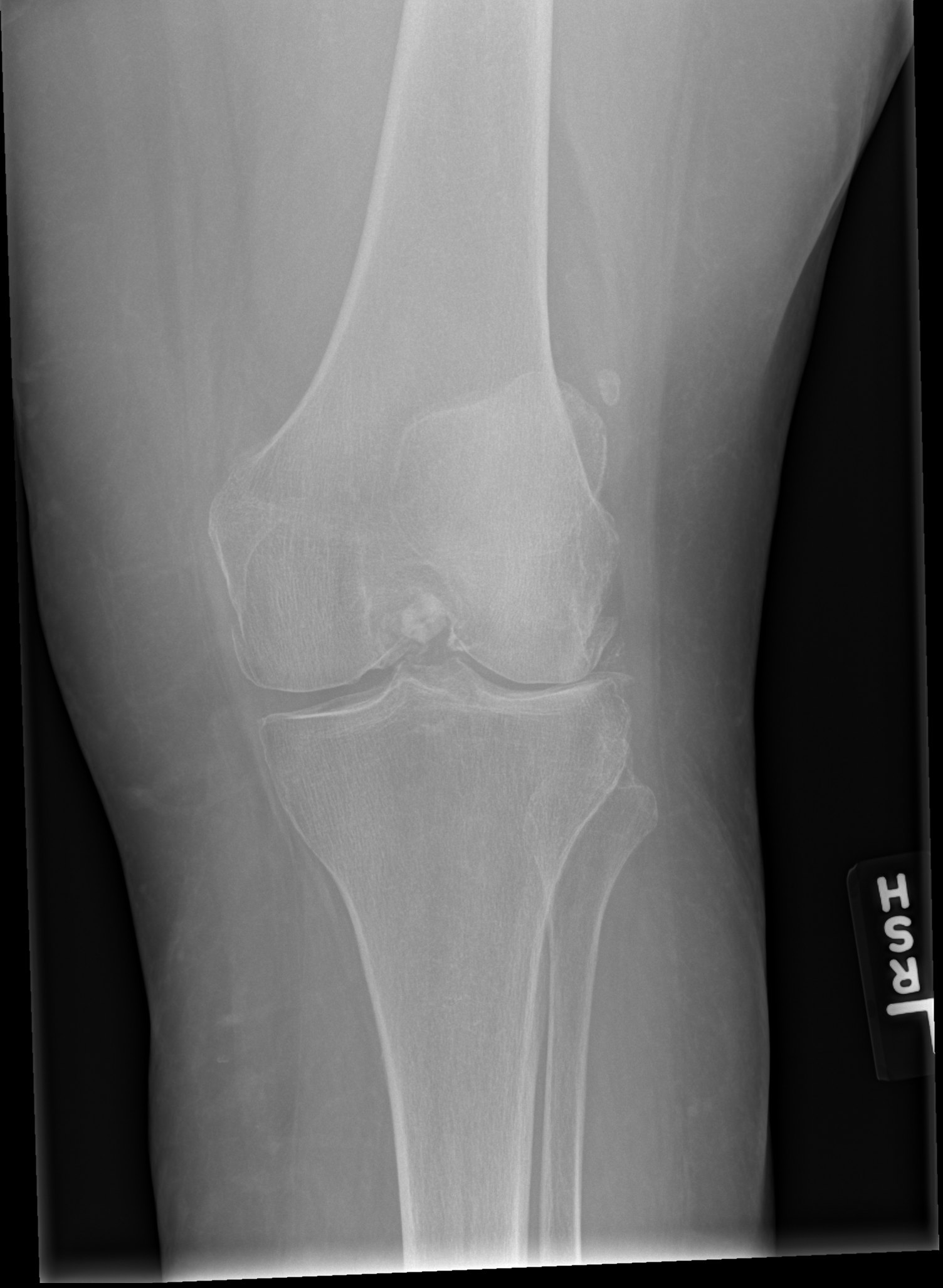

[x knee obl left (1 of 2)]
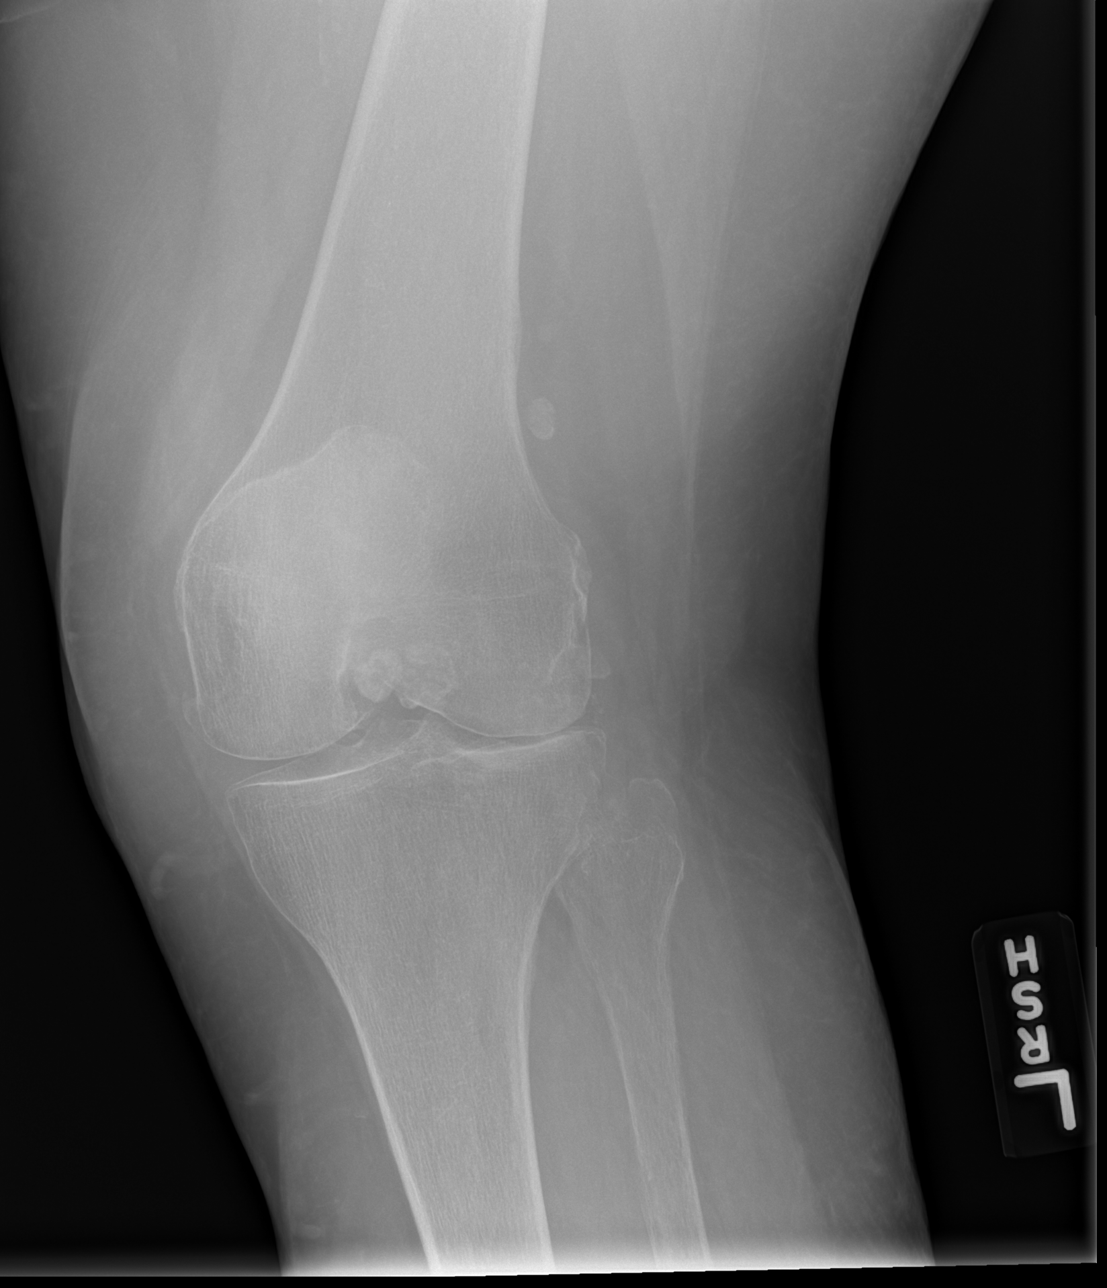

[x knee obl left (2 of 2)]
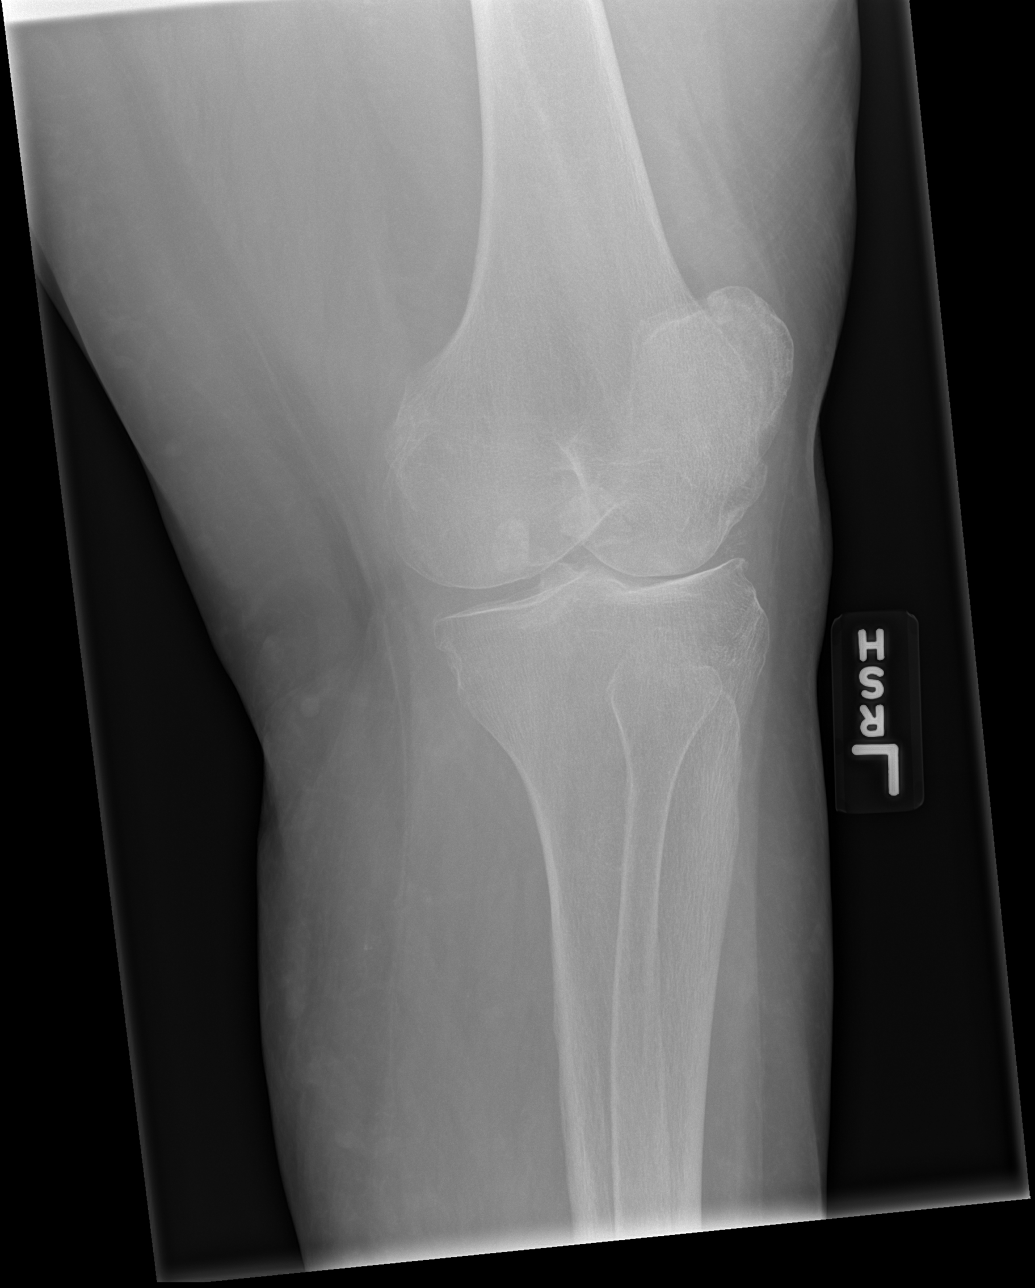

[x knee lat left]
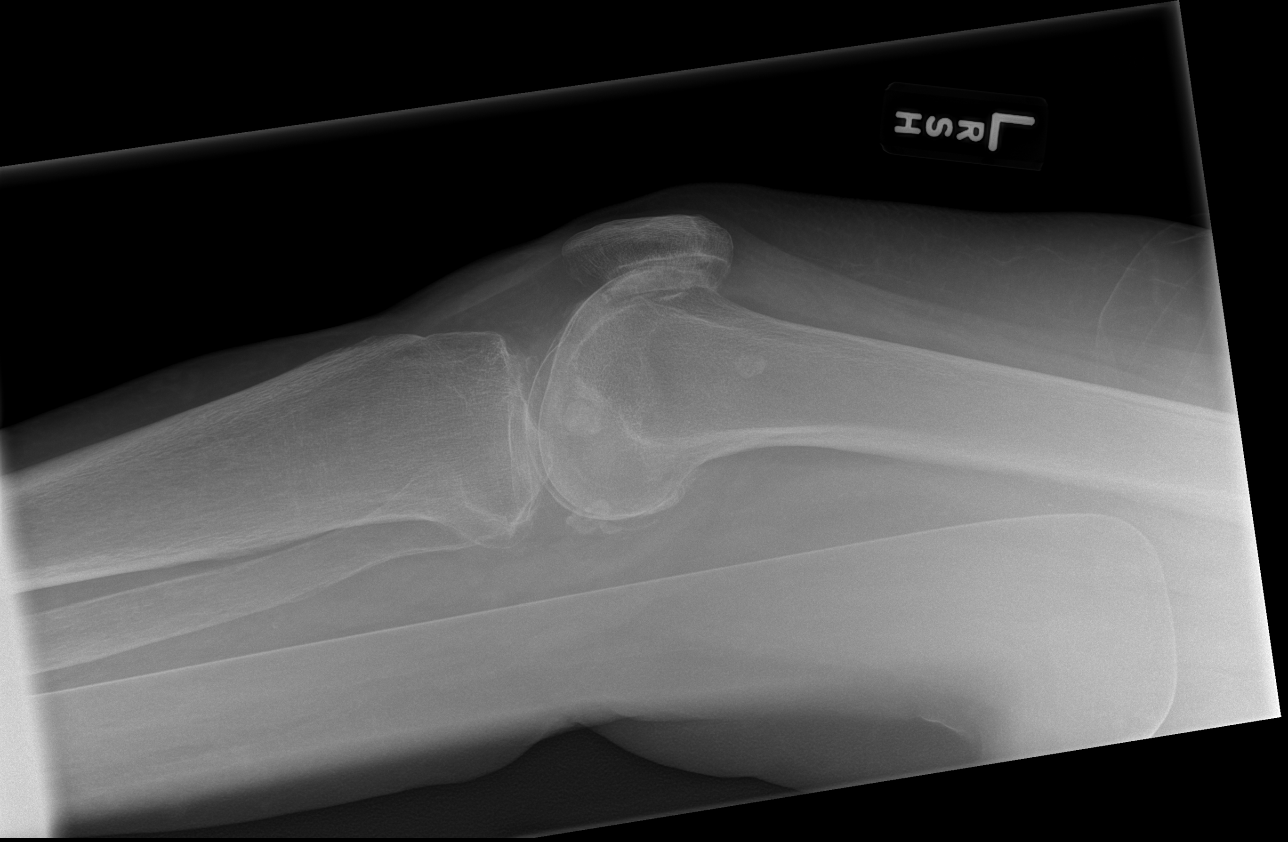

[4 of 4 positions shown; findings below may reference images not displayed]

FINDINGS: Frontal, lateral, and bilateral oblique views were obtained. There
is no fracture or dislocation. There is mild lateral patellar
subluxation, however. There is no joint effusion. There is severe
joint space narrowing laterally and in the patellofemoral joint
regions. There is spurring laterally. No erosive change. There is
mild chondrocalcinosis laterally.
IMPRESSION: Extensive osteoarthritic change, most marked laterally.
Chondrocalcinosis is noted laterally, a finding that may be due to
osteoarthritic change but also may be indicative of superimposed
calcium pyrophosphate deposition disease. There is lateral patellar
subluxation. No frank dislocation. No fracture or effusion.

## 2015-07-09 MED ORDER — OXYCODONE-ACETAMINOPHEN 5-325 MG PO TABS
1.0000 | ORAL_TABLET | Freq: Once | ORAL | Status: AC
  Administered 2015-07-09: 1 via ORAL
  Filled 2015-07-09: qty 1

## 2015-07-09 MED ORDER — OXYCODONE-ACETAMINOPHEN 5-325 MG PO TABS: 2.0000 | ORAL_TABLET | ORAL | Status: DC | PRN

## 2015-07-09 NOTE — ED Notes (Signed)
Patient comes from home. Per EMS patient went to see PCP for ongoing complaint of weakness patient left appointment and when she arrived home she was unable to stand. Patients' son assisted patient to the floor. Vitals with EMS 156/96 HR70 [id number]. Patient alert and Oriented on arrival moving all extermties.

## 2015-07-09 NOTE — Care Management Note (Signed)
Case Management Note  Patient Details  Name: BHAVANA MCMANAMY MRN: [id number] Date of Birth: 11-Aug-1932  Subjective/Objective:                  82 y.o F with a pmhx of chronic knee pain, tachy-brady arrythmias, a.fib on coumadin, HTN, HLD who presents to the ED today c/o L knee pain. //Home alone.  Action/Plan: Follow for disposition needs.   Expected Discharge Date:      07/09/15            Expected Discharge Plan:  [place]  In-House Referral:  NA  Discharge planning Services  CM Consult  Post Acute Care Choice:  Home Health Choice offered to:  Patient, Adult Children  DME Arranged:  N/A DME Agency:  NA  HH Arranged:  PT HH Agency:  [facility]  Status of Service:  Completed, signed off  Medicare Important Message Given:    Date Medicare IM Given:    Medicare IM give by:    Date Additional Medicare IM Given:    Additional Medicare Important Message give by:     If discussed at [place] of Stay Meetings, dates discussed:    Additional Comments: Christeena Krogh J. Clydene Laming, RN, BSN, [employer] [phone] Spoke with pt at bedside regarding discharge planning for [facility]. Offered pt list of home health agencies to choose from.  Pt chose [facility] to render services. Danny Lawless of [place] notified.  DME wheelchair needs identified at this time and patient son states he will purchase one from [employer].  Fuller Mandril, RN 07/09/2015, 2:54 PM

## 2015-07-09 NOTE — ED Provider Notes (Signed)
CSN: [id number]     Arrival date & time 07/09/15  1223 History   First MD Initiated Contact with Patient 07/09/15 1231     Chief Complaint  Patient presents with  . Weakness     (Consider location/radiation/quality/duration/timing/severity/associated sxs/prior Treatment) HPI   Charlotte Castro is an 82 y.o F with a pmhx of chronic knee pain, tachy-brady arrythmias, a.fib on coumadin, HTN, HLD who presents to the ED today c/o L knee pain. Pt states that she has had chronic knee pain off and on for several years, worse on the left. Pt has required multiple steroid injections in her knee for this, last injection was 7 months ago. Pt states that over the last month her left knee has become more painful and she has not been able to put as much pressure on it. Pt states that today she went to her doctor appointment and was "standing on her feet all day". When pt left the office and was walking to the car her left knee "gave out" and was unable to put pressure on it due to pain. Pt reports pain in her posterior knee that radiates down to her ankle. Pain increases with flexion of left knee and with lifting her left leg. Pt has been taking tylenol for her symptoms with moderate relief. Pt also states that over the last several month she has gotten progressively weaker and has had difficulties getting around. Pt has seen her PCP and cardiologist for this issue.   Of note, pt had INR level checked at cardiologist today and it was 2.7.    Past Medical History  Diagnosis Date  . Dizziness     chronic and of an unclear etiology  . Hypothyroidism   . HTN (hypertension)   . Hyperlipemia   . Obesity   . Osteoporosis   . Gastritis   . Vitamin D deficiency   . Atrial tachycardia ([place])     ablated 11/17/10  by JA  from the [facility] of the aorta  . Atrial flutter ([place])     typical appearing  . Atrial fibrillation ([place])   . GERD (gastroesophageal reflux disease)    Past Surgical History  Procedure Laterality Date   . Atrial ablation surgery  11/17/10    Atrial tachycardia arising from [facility] of the aorta ablated by JA   Family History  Problem Relation Age of Onset  . Heart attack Father   . Hypertension Father    Social History  Substance Use Topics  . Smoking status: Never Smoker   . Smokeless tobacco: Never Used  . Alcohol Use: No   OB History    No data available     Review of Systems  All other systems reviewed and are negative.     Allergies  Iodinated diagnostic agents  Home Medications   Prior to Admission medications   Medication Sig Start Date End Date Taking? Authorizing Provider  acetaminophen (TYLENOL) 325 MG tablet Take 650 mg by mouth every 4 (four) hours as needed. For headache   Yes Historical Provider, MD  Calcium Carbonate-Vitamin D (CALCIUM-VITAMIN D) 600-200 MG-UNIT CAPS Take 1 capsule by mouth daily.    Yes Historical Provider, MD  Cholecalciferol (VITAMIN D3) 1000 UNITS CAPS Take 1 capsule by mouth daily.   Yes Historical Provider, MD  clonazePAM (KLONOPIN) 0.5 MG tablet Take 0.5 mg by mouth 2 (two) times daily.    Yes Historical Provider, MD  furosemide (LASIX) 20 MG tablet Take 1 tablet (20 mg total)  by mouth as directed. TAKE 20 MG 2 DAYS A WEEK 09/24/14  Yes Jettie Booze, MD  levothyroxine (SYNTHROID, LEVOTHROID) 88 MCG tablet Take 88 mcg by mouth daily.   Yes Historical Provider, MD  metoprolol tartrate (LOPRESSOR) 50 MG tablet Take 1 tablet (50 mg total) by mouth 2 (two) times daily. 09/24/14  Yes Jettie Booze, MD  warfarin (COUMADIN) 5 MG tablet Take 1 tablet (5 mg total) by mouth daily. 07/03/15  Yes Jettie Booze, MD   BP 166/96 mmHg  Pulse 88  Temp(Src) 98.2 F (36.8 C) (Oral)  Resp 15  SpO2 98% Physical Exam  Constitutional: She is oriented to person, place, and time. She appears well-developed and well-nourished. No distress.  HENT:  Head: Normocephalic and atraumatic.  Mouth/Throat: No oropharyngeal exudate.  Eyes: Conjunctivae  and EOM are normal. Pupils are equal, round, and reactive to light. Right eye exhibits no discharge. Left eye exhibits no discharge. No scleral icterus.  Cardiovascular: Normal rate, regular rhythm, normal heart sounds and intact distal pulses.  Exam reveals no gallop and no friction rub.   No murmur heard. Pulmonary/Chest: Effort normal and breath sounds normal. No respiratory distress. She has no wheezes. She has no rales. She exhibits no tenderness.  Abdominal: Soft. She exhibits no distension. There is no tenderness. There is no guarding.  Musculoskeletal: She exhibits no edema.  Negative anterior/poster drawer bilaterally. Negative ballottement test. No varus or valgus laxity. Crepitus present. Pain felt with flexion or extension. Decrease ROm limited by pain. Anterior TTP of left knee. No obvious bony deformity. No edema. No calf tenderness. Intact distal pulses.    Neurological: She is alert and oriented to person, place, and time. She displays normal reflexes. Coordination normal.  Strength diminished throughout, likely due to deconditioning. Lifting LLE causes pain in left knee. No sensory deficits. No pronator drift. No facial droop. No slurred speech. Cranial nerves 3-12 gossly intact.   Skin: Skin is warm and dry. No rash noted. She is not diaphoretic. No erythema. No pallor.  Psychiatric: She has a normal mood and affect. Her behavior is normal.  Nursing note and vitals reviewed.   ED Course  Procedures (including critical care time) Labs Review Labs Reviewed - No data to display  Imaging Review No results found. I have personally reviewed and evaluated these images and lab results as part of my medical decision-making.   EKG Interpretation None      MDM   Final diagnoses:  Chronic knee pain, left    82 y.o F with hx of chronic knee pain presents to the ED c/o knee pain. Pt has been having worsening left knee pain over the last month. Pt has had multiple steroid  injection in knees, last injection 7 months ago. Pt had been standing all day today, while walking to car, pt left knee gave out and was unable to stand up to due pain. No new trauma or injury. No neurological deficits on exam. Decsreased strength throughout, likely due to deconditioning. INR checked today at outpt physician's office, and it was 2.7. Doubt intraarticular bleeding. Xray reveals extensive osteoarthritic change, chondrocalcinosis, lateral patellar subluxation, no frank dislocation. Pt placed in knee brace. Pain managed in ED> Recommend follow up with Dr. Percell Miller, pt's orthopedic provider. Case management consulted to assist pt in setting up home health as pt lives alone. Will d/c home with pain medication and write a prescription for a wheelchair. Pt has wheelchair accessibility at home. Discussed treatment plan with  pt who is agreeable. Return precautions outlined in patient discharge instructions.   Patient was discussed with and seen by Dr. Alvino Chapel who agrees with the treatment plan.      Dondra Spry [place], PA-C 07/11/15 1524  Davonna Belling, MD 07/11/15 [phone]

## 2015-07-09 NOTE — ED Notes (Signed)
MD made aware of patient drift to left arm and decrease sensation in left leg and arm. and weakness to left leg.

## 2015-07-09 NOTE — Discharge Instructions (Signed)
Cryotherapy °Cryotherapy means treatment with cold. Ice or gel packs can be used to reduce both pain and swelling. Ice is the most helpful within the first 24 to 48 hours after an injury or flare-up from overusing a muscle or joint. Sprains, strains, spasms, burning pain, shooting pain, and aches can all be eased with ice. Ice can also be used when recovering from surgery. Ice is effective, has very few side effects, and is safe for most people to use. °PRECAUTIONS  °Ice is not a safe treatment option for people with: °· Raynaud phenomenon. This is a condition affecting small blood vessels in the extremities. Exposure to cold may cause your problems to return. °· Cold hypersensitivity. There are many forms of cold hypersensitivity, including: °· Cold urticaria. Red, itchy hives appear on the skin when the tissues begin to warm after being iced. °· Cold erythema. This is a red, itchy rash caused by exposure to cold. °· Cold hemoglobinuria. Red blood cells break down when the tissues begin to warm after being iced. The hemoglobin that carry oxygen are passed into the urine because they cannot combine with blood proteins fast enough. °· Numbness or altered sensitivity in the area being iced. °If you have any of the following conditions, do not use ice until you have discussed cryotherapy with your caregiver: °· Heart conditions, such as arrhythmia, angina, or chronic heart disease. °· High blood pressure. °· Healing wounds or open skin in the area being iced. °· Current infections. °· Rheumatoid arthritis. °· Poor circulation. °· Diabetes. °Ice slows the blood flow in the region it is applied. This is beneficial when trying to stop inflamed tissues from spreading irritating chemicals to surrounding tissues. However, if you expose your skin to cold temperatures for too long or without the proper protection, you can damage your skin or nerves. Watch for signs of skin damage due to cold. °HOME CARE INSTRUCTIONS °Follow  these tips to use ice and cold packs safely. °· Place a dry or damp towel between the ice and skin. A damp towel will cool the skin more quickly, so you may need to shorten the time that the ice is used. °· For a more rapid response, add gentle compression to the ice. °· Ice for no more than 10 to 20 minutes at a time. The bonier the area you are icing, the less time it will take to get the benefits of ice. °· Check your skin after 5 minutes to make sure there are no signs of a poor response to cold or skin damage. °· Rest 20 minutes or more between uses. °· Once your skin is numb, you can end your treatment. You can test numbness by very lightly touching your skin. The touch should be so light that you do not see the skin dimple from the pressure of your fingertip. When using ice, most people will feel these normal sensations in this order: cold, burning, aching, and numbness. °· Do not use ice on someone who cannot communicate their responses to pain, such as small children or people with dementia. °HOW TO MAKE AN ICE PACK °Ice packs are the most common way to use ice therapy. Other methods include ice massage, ice baths, and cryosprays. Muscle creams that cause a cold, tingly feeling do not offer the same benefits that ice offers and should not be used as a substitute unless recommended by your caregiver. °To make an ice pack, do one of the following: °· Place crushed ice or a   bag of frozen vegetables in a sealable plastic bag. Squeeze out the excess air. Place this bag inside another plastic bag. Slide the bag into a pillowcase or place a damp towel between your skin and the bag.  Mix 3 parts water with 1 part rubbing alcohol. Freeze the mixture in a sealable plastic bag. When you remove the mixture from the freezer, it will be slushy. Squeeze out the excess air. Place this bag inside another plastic bag. Slide the bag into a pillowcase or place a damp towel between your skin and the bag. SEEK MEDICAL CARE  IF:  You develop white spots on your skin. This may give the skin a blotchy (mottled) appearance.  Your skin turns blue or pale.  Your skin becomes waxy or hard.  Your swelling gets worse. MAKE SURE YOU:   Understand these instructions.  Will watch your condition.  Will get help right away if you are not doing well or get worse.   This information is not intended to replace advice given to you by your health care provider. Make sure you discuss any questions you have with your health care provider.   Document Released: 01/25/2011 Document Revised: 06/21/2014 Document Reviewed: 01/25/2011 Elsevier Interactive Patient Education 2016 [employer]  Knee Pain Knee pain is a very common symptom and can have many causes. Knee pain often goes away when you follow your health care provider's instructions for relieving pain and discomfort at home. However, knee pain can develop into a condition that needs treatment. Some conditions may include:  Arthritis caused by wear and tear (osteoarthritis).  Arthritis caused by swelling and irritation (rheumatoid arthritis or gout).  A cyst or growth in your knee.  An infection in your knee joint.  An injury that will not heal.  Damage, swelling, or irritation of the tissues that support your knee (torn ligaments or tendinitis). If your knee pain continues, additional tests may be ordered to diagnose your condition. Tests may include X-rays or other imaging studies of your knee. You may also need to have fluid removed from your knee. Treatment for ongoing knee pain depends on the cause, but treatment may include:  Medicines to relieve pain or swelling.  Steroid injections in your knee.  Physical therapy.  Surgery. HOME CARE INSTRUCTIONS  Take medicines only as directed by your health care provider.  Rest your knee and keep it raised (elevated) while you are resting.  Do not do things that cause or worsen pain.  Avoid high-impact  activities or exercises, such as running, jumping rope, or doing jumping jacks.  Apply ice to the knee area:  Put ice in a plastic bag.  Place a towel between your skin and the bag.  Leave the ice on for 20 minutes, 2-3 times a day.  Ask your health care provider if you should wear an elastic knee support.  Keep a pillow under your knee when you sleep.  Lose weight if you are overweight. Extra weight can put pressure on your knee.  Do not use any tobacco products, including cigarettes, chewing tobacco, or electronic cigarettes. If you need help quitting, ask your health care provider. Smoking may slow the healing of any bone and joint problems that you may have. SEEK MEDICAL CARE IF:  Your knee pain continues, changes, or gets worse.  You have a fever along with knee pain.  Your knee buckles or locks up.  Your knee becomes more swollen. SEEK IMMEDIATE MEDICAL CARE IF:   Your knee  joint feels hot to the touch.  You have chest pain or trouble breathing.   This information is not intended to replace advice given to you by your health care provider. Make sure you discuss any questions you have with your health care provider.  Follow up with Dr. Percell Miller with orthopedic surgery. You may need another steroid injection in your knee. Keep knee brace on for additional stability. Apply ice to affected area. Return to the ED if you experience worsening of you symptoms, chest pain, shortness of breath, redness or swelling around knee, fevers.

## 2015-07-09 NOTE — Patient Instructions (Signed)

## 2015-08-07 ENCOUNTER — Ambulatory Visit: Payer: Medicare Other | Admitting: Interventional Cardiology

## 2015-08-28 ENCOUNTER — Ambulatory Visit (INDEPENDENT_AMBULATORY_CARE_PROVIDER_SITE_OTHER): Payer: Medicare Other | Admitting: *Deleted

## 2015-08-28 DIAGNOSIS — I4891 Unspecified atrial fibrillation: Secondary | ICD-10-CM | POA: Diagnosis not present

## 2015-08-28 DIAGNOSIS — Z5181 Encounter for therapeutic drug level monitoring: Secondary | ICD-10-CM

## 2015-08-28 DIAGNOSIS — I482 Chronic atrial fibrillation, unspecified: Secondary | ICD-10-CM

## 2015-08-28 LAB — POCT INR: INR: 2.3

## 2015-09-19 ENCOUNTER — Telehealth: Payer: Self-pay | Admitting: Interventional Cardiology

## 2015-09-19 NOTE — Telephone Encounter (Signed)
Walk in pt form-[employer]-paper dropped off Charlotte Castro back Tuesday 4/11

## 2015-09-25 ENCOUNTER — Ambulatory Visit (INDEPENDENT_AMBULATORY_CARE_PROVIDER_SITE_OTHER): Payer: Medicare Other | Admitting: *Deleted

## 2015-09-25 DIAGNOSIS — I4891 Unspecified atrial fibrillation: Secondary | ICD-10-CM | POA: Diagnosis not present

## 2015-09-25 DIAGNOSIS — Z5181 Encounter for therapeutic drug level monitoring: Secondary | ICD-10-CM | POA: Diagnosis not present

## 2015-09-25 DIAGNOSIS — I482 Chronic atrial fibrillation, unspecified: Secondary | ICD-10-CM

## 2015-09-25 LAB — POCT INR: INR: 1.9

## 2015-09-26 IMAGING — US US ABDOMEN COMPLETE
1 series · 14 of 25 positions shown · non-contrast
Comparison: None.

CLINICAL DATA: Chronic nausea and vomiting.  Initial encounter.

EXAM:
ABDOMEN ULTRASOUND COMPLETE

[Series 1: us abdomen complete · 0.23mm/px · 14 of 89 slices shown]
[im 1/89]
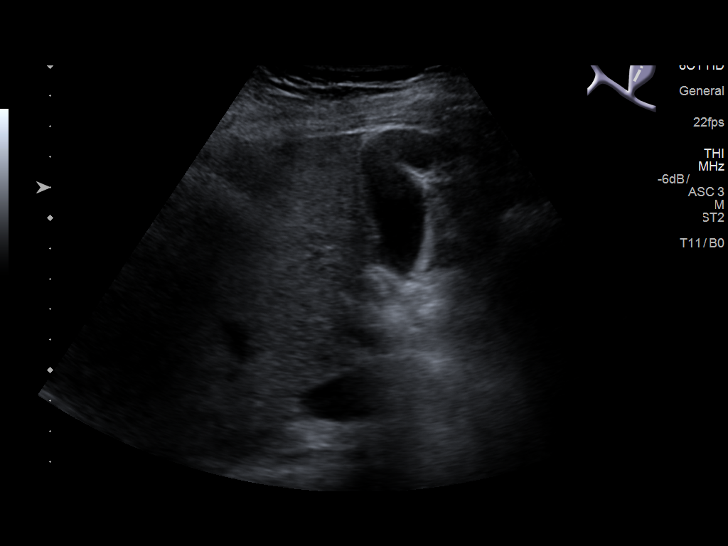
[im 8/89]
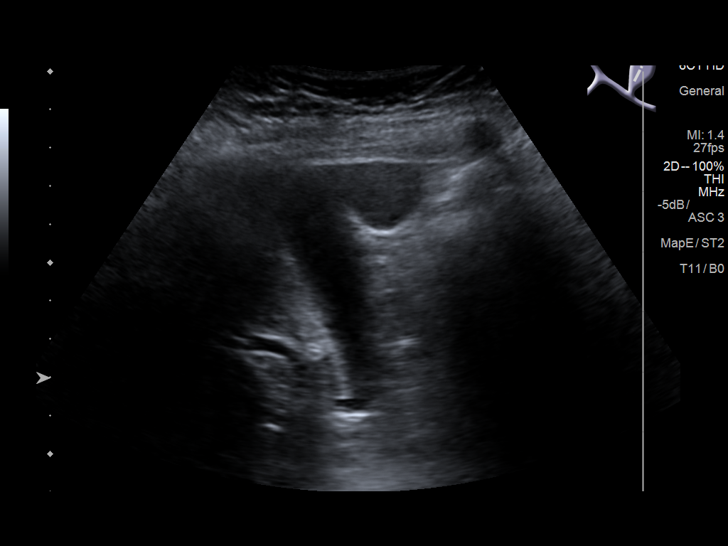
[im 15/89]
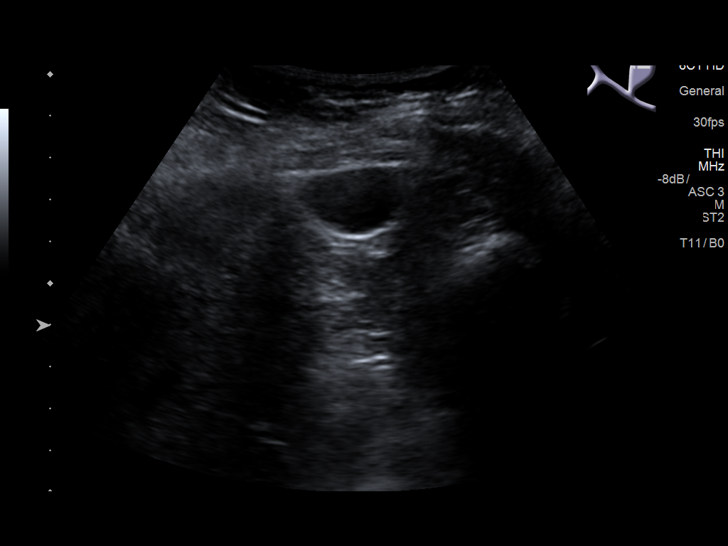
[im 23/89]
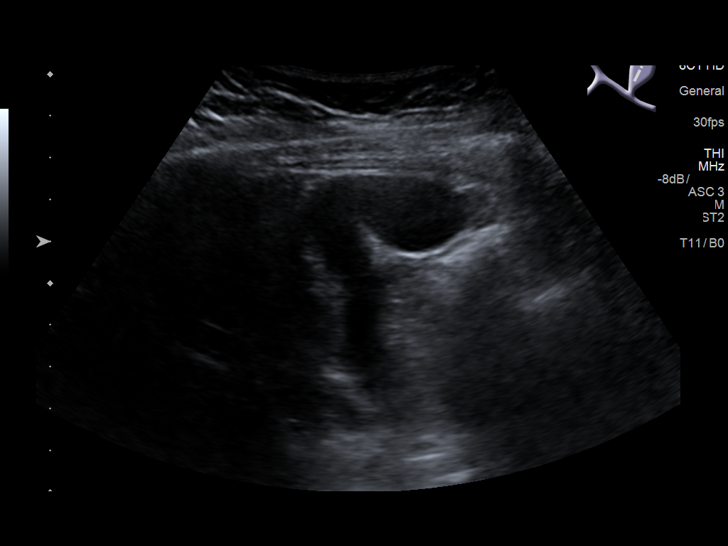
[im 30/89]
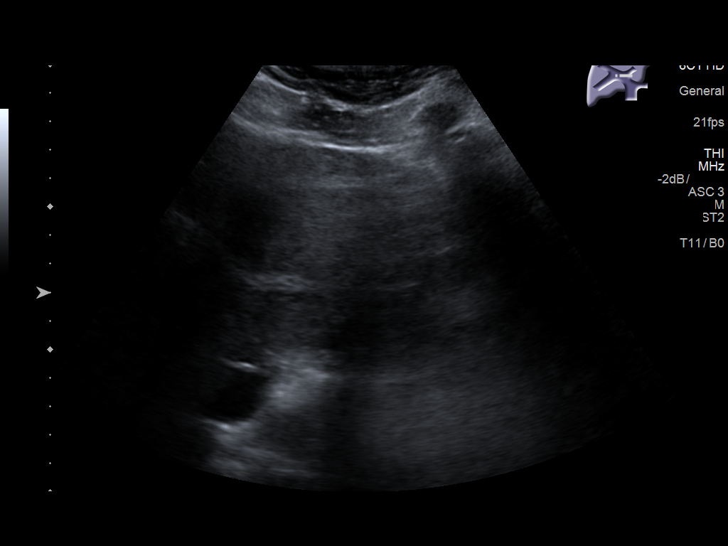
[im 34/89]
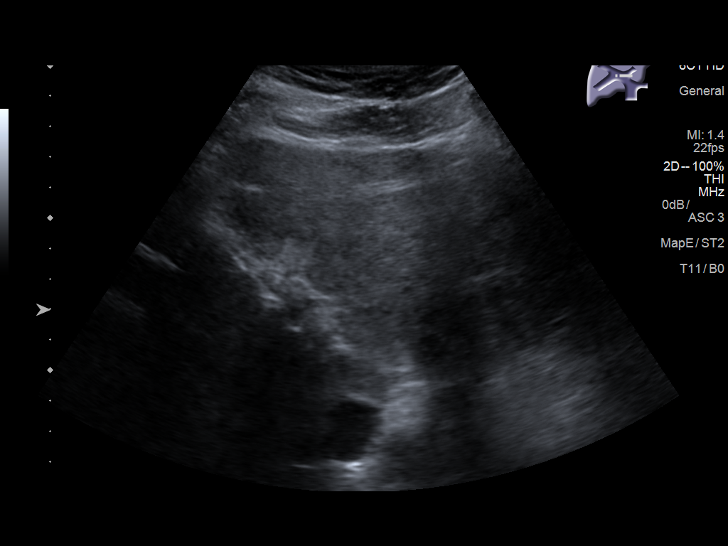
[im 41/89]
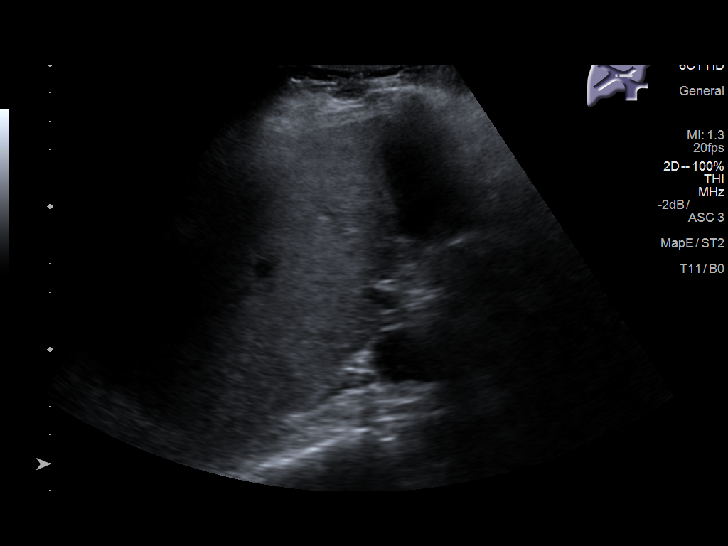
[im 48/89]
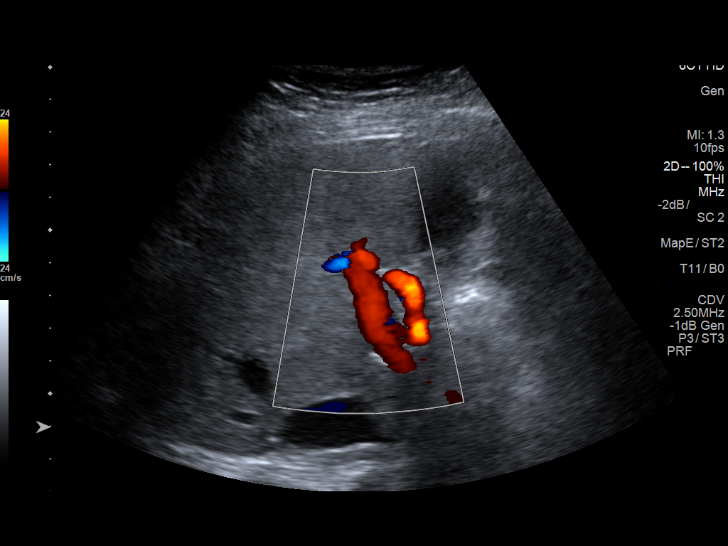
[im 56/89]
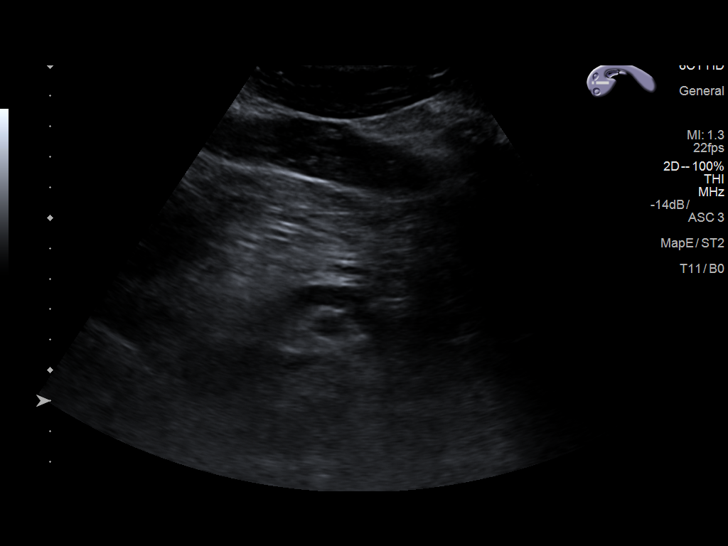
[im 59/89]
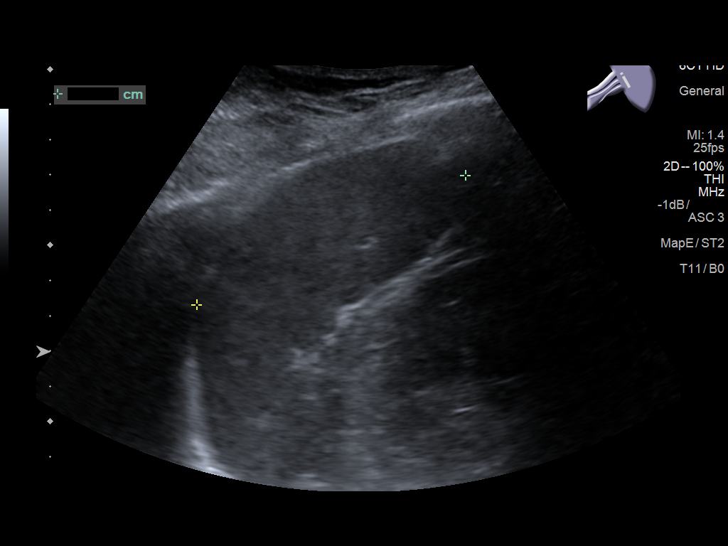
[im 67/89]
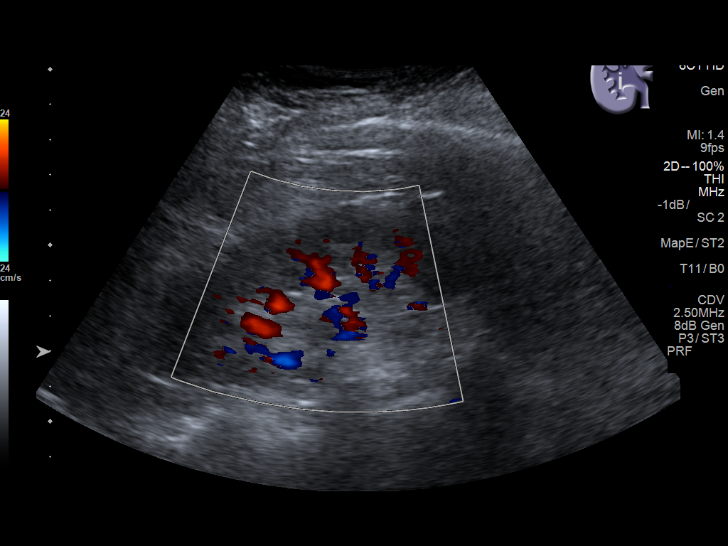
[im 74/89]
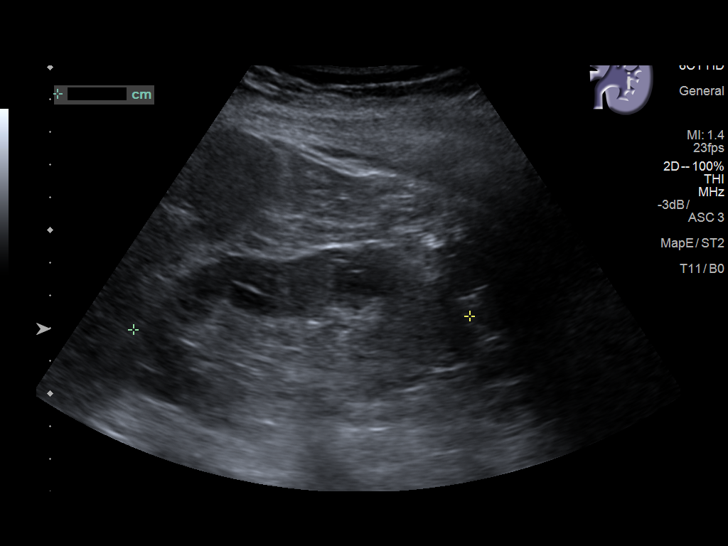
[im 81/89]
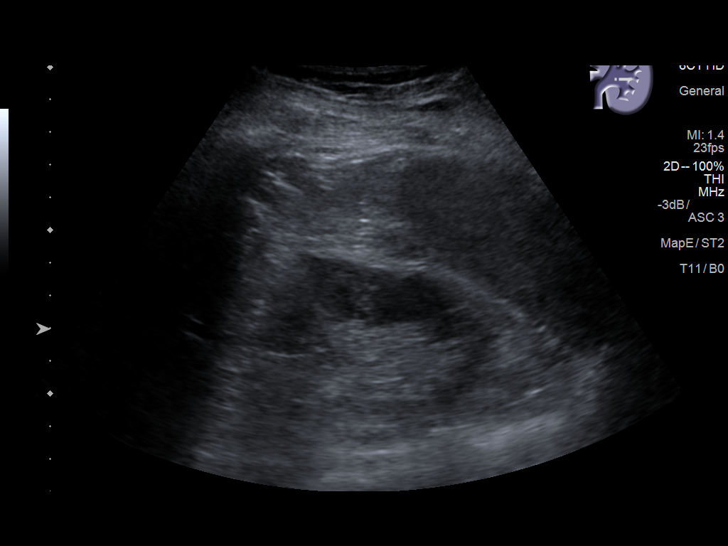
[im 89/89]
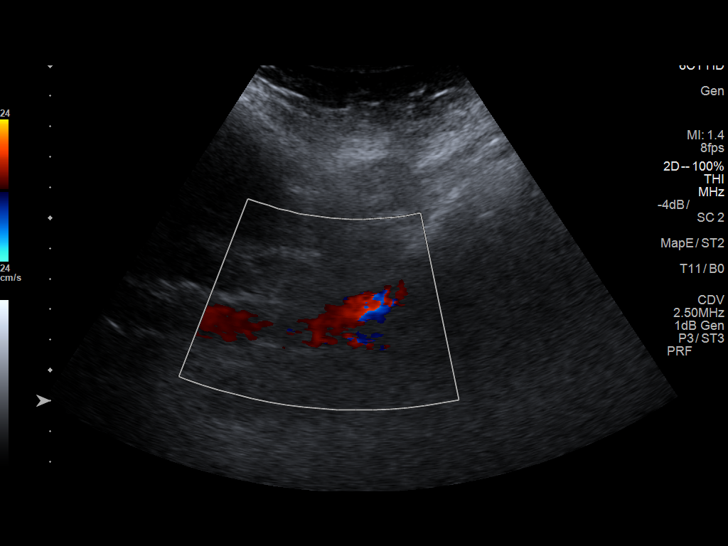

[14 of 25 positions shown; findings below may reference images not displayed]

FINDINGS: Gallbladder: No gallstones or wall thickening visualized. No
sonographic Murphy sign noted by sonographer.

Common bile duct: Diameter: 0.3 cm, within normal limits in caliber.

Liver: No focal lesion identified. Mildly increased parenchymal
echogenicity, likely reflecting fatty infiltration.

IVC: No abnormality visualized.

Pancreas: Visualized portion unremarkable.

Spleen: Size and appearance within normal limits.

Right Kidney: Length: 10.3 cm. Echogenicity within normal limits. No
mass or hydronephrosis visualized.

Left Kidney: Length: 10.3 cm. Echogenicity within normal limits. No
mass or hydronephrosis visualized.

Abdominal aorta: No aneurysm visualized. Mild calcification is noted
along the abdominal aorta.

Other findings: None.
IMPRESSION: 1. No acute abnormality seen within the abdomen.
2. Mild calcification along the abdominal aorta.
3. Mildly fatty infiltration within the liver.

## 2015-10-02 ENCOUNTER — Telehealth: Payer: Self-pay | Admitting: Interventional Cardiology

## 2015-10-02 ENCOUNTER — Telehealth: Payer: Self-pay

## 2015-10-02 NOTE — Telephone Encounter (Signed)
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  Writer explained to patient neither Dr Irish Lack or his nurse were here today.  Also explained Dr Irish Lack does not perform DOT examinations as he is a cardiologist.  Encouraged pt to contact her PCP, patient stated she had spoken with someone at her PCP office who stated "Dr Chapman Fitch does not want to get involved."  Pt began to wave the form around and raise her voice, writer acknowledged her frustration and concern about the deadline for license renewal.  Assistance to locate a MD who does DOT exams offered.  Pt declined stating she was not physically able to drive all over town. Writer escorted patient to the elevator.   Pt stated she "would find a lawyer" to help with the form.  Georgana Curio MHA RN CCM

## 2015-10-02 NOTE — Telephone Encounter (Signed)
New message      [employer] need a note stating pt is safe to drive and should [employer] continue to follow up on pt or can she be removed from their follow up list.  Please fax note to [phone].  Call if there is a problem

## 2015-10-02 NOTE — Telephone Encounter (Signed)
See phone note from earlier today. 

## 2015-10-04 ENCOUNTER — Emergency Department (HOSPITAL_COMMUNITY): Payer: Medicare Other

## 2015-10-04 ENCOUNTER — Ambulatory Visit (INDEPENDENT_AMBULATORY_CARE_PROVIDER_SITE_OTHER): Payer: Medicare Other | Admitting: Family Medicine

## 2015-10-04 ENCOUNTER — Emergency Department (HOSPITAL_COMMUNITY)
Admission: EM | Admit: 2015-10-04 | Discharge: 2015-10-04 | Disposition: A | Discharge status: Home / Self Care | Payer: Medicare Other | Attending: Emergency Medicine | Admitting: Emergency Medicine

## 2015-10-04 ENCOUNTER — Encounter (HOSPITAL_COMMUNITY): Payer: Self-pay

## 2015-10-04 VITALS — BP 152/92 | HR 124 | Temp 98.0°F | Resp 18

## 2015-10-04 DIAGNOSIS — F439 Reaction to severe stress, unspecified: Secondary | ICD-10-CM

## 2015-10-04 DIAGNOSIS — I481 Persistent atrial fibrillation: Secondary | ICD-10-CM | POA: Insufficient documentation

## 2015-10-04 DIAGNOSIS — R0600 Dyspnea, unspecified: Secondary | ICD-10-CM | POA: Diagnosis not present

## 2015-10-04 DIAGNOSIS — I4891 Unspecified atrial fibrillation: Secondary | ICD-10-CM | POA: Diagnosis not present

## 2015-10-04 DIAGNOSIS — I10 Essential (primary) hypertension: Secondary | ICD-10-CM | POA: Insufficient documentation

## 2015-10-04 DIAGNOSIS — R112 Nausea with vomiting, unspecified: Secondary | ICD-10-CM

## 2015-10-04 DIAGNOSIS — R531 Weakness: Secondary | ICD-10-CM | POA: Diagnosis not present

## 2015-10-04 DIAGNOSIS — M810 Age-related osteoporosis without current pathological fracture: Secondary | ICD-10-CM | POA: Diagnosis not present

## 2015-10-04 DIAGNOSIS — E785 Hyperlipidemia, unspecified: Secondary | ICD-10-CM | POA: Insufficient documentation

## 2015-10-04 DIAGNOSIS — R197 Diarrhea, unspecified: Secondary | ICD-10-CM | POA: Diagnosis not present

## 2015-10-04 DIAGNOSIS — I4819 Other persistent atrial fibrillation: Secondary | ICD-10-CM

## 2015-10-04 DIAGNOSIS — Z8719 Personal history of other diseases of the digestive system: Secondary | ICD-10-CM | POA: Diagnosis not present

## 2015-10-04 DIAGNOSIS — N390 Urinary tract infection, site not specified: Secondary | ICD-10-CM | POA: Diagnosis not present

## 2015-10-04 DIAGNOSIS — E559 Vitamin D deficiency, unspecified: Secondary | ICD-10-CM | POA: Insufficient documentation

## 2015-10-04 DIAGNOSIS — E039 Hypothyroidism, unspecified: Secondary | ICD-10-CM | POA: Diagnosis not present

## 2015-10-04 DIAGNOSIS — Z79899 Other long term (current) drug therapy: Secondary | ICD-10-CM | POA: Diagnosis not present

## 2015-10-04 DIAGNOSIS — I4892 Unspecified atrial flutter: Secondary | ICD-10-CM | POA: Diagnosis not present

## 2015-10-04 DIAGNOSIS — E669 Obesity, unspecified: Secondary | ICD-10-CM | POA: Insufficient documentation

## 2015-10-04 DIAGNOSIS — R0789 Other chest pain: Secondary | ICD-10-CM

## 2015-10-04 DIAGNOSIS — I482 Chronic atrial fibrillation: Secondary | ICD-10-CM | POA: Diagnosis not present

## 2015-10-04 DIAGNOSIS — Z9114 Patient's other noncompliance with medication regimen: Secondary | ICD-10-CM | POA: Diagnosis not present

## 2015-10-04 DIAGNOSIS — Z7901 Long term (current) use of anticoagulants: Secondary | ICD-10-CM | POA: Diagnosis not present

## 2015-10-04 DIAGNOSIS — F430 Acute stress reaction: Secondary | ICD-10-CM

## 2015-10-04 DIAGNOSIS — R002 Palpitations: Secondary | ICD-10-CM | POA: Diagnosis present

## 2015-10-04 DIAGNOSIS — Z91148 Patient's other noncompliance with medication regimen for other reason: Secondary | ICD-10-CM

## 2015-10-04 LAB — PROTIME-INR
INR: 2.54 — AB (ref 0.00–1.49)
Prothrombin Time: 27 seconds — ABNORMAL HIGH (ref 11.6–15.2)

## 2015-10-04 LAB — MAGNESIUM: Magnesium: 1.7 mg/dL (ref 1.7–2.4)

## 2015-10-04 LAB — BASIC METABOLIC PANEL
ANION GAP: 15 (ref 5–15)
BUN: 10 mg/dL (ref 6–20)
CO2: 19 mmol/L — ABNORMAL LOW (ref 22–32)
Calcium: 9.9 mg/dL (ref 8.9–10.3)
Chloride: 109 mmol/L (ref 101–111)
Creatinine, Ser: 1.1 mg/dL — ABNORMAL HIGH (ref 0.44–1.00)
GFR calc Af Amer: 53 mL/min — ABNORMAL LOW (ref >60)
GFR, EST NON AFRICAN AMERICAN: 46 mL/min — AB (ref >60)
GLUCOSE: 91 mg/dL (ref 65–99)
POTASSIUM: 4.2 mmol/L (ref 3.5–5.1)
Sodium: 143 mmol/L (ref 135–145)

## 2015-10-04 LAB — I-STAT TROPONIN, ED: TROPONIN I, POC: 0.02 ng/mL (ref 0.00–0.08)

## 2015-10-04 LAB — CBC
HEMATOCRIT: 42.5 % (ref 36.0–46.0)
HEMOGLOBIN: 15 g/dL (ref 12.0–15.0)
MCH: 32.8 pg (ref 26.0–34.0)
MCHC: 35.3 g/dL (ref 30.0–36.0)
MCV: 92.8 fL (ref 78.0–100.0)
Platelets: 183 10*3/uL (ref 150–400)
RBC: 4.58 MIL/uL (ref 3.87–5.11)
RDW: 13.8 % (ref 11.5–15.5)
WBC: 8.5 10*3/uL (ref 4.0–10.5)

## 2015-10-04 LAB — DIFFERENTIAL
BASOS ABS: 0.1 10*3/uL (ref 0.0–0.1)
BASOS PCT: 1 %
EOS ABS: 0.1 10*3/uL (ref 0.0–0.7)
Eosinophils Relative: 1 %
Lymphocytes Relative: 32 %
Lymphs Abs: 2.7 10*3/uL (ref 0.7–4.0)
MONOS PCT: 10 %
Monocytes Absolute: 0.9 10*3/uL (ref 0.1–1.0)
NEUTROS PCT: 56 %
Neutro Abs: 4.8 10*3/uL (ref 1.7–7.7)

## 2015-10-04 LAB — TSH: TSH: 3.329 u[IU]/mL (ref 0.350–4.500)

## 2015-10-04 LAB — GLUCOSE, POCT (MANUAL RESULT ENTRY): POC Glucose: 121 mg/dl — AB (ref 70–99)

## 2015-10-04 IMAGING — DX DG CHEST 2V
3 series · 3 of 3 positions shown · non-contrast
Comparison: [DATE]

CLINICAL DATA: Weakness for 2 days

EXAM:
CHEST  2 VIEW

[w chest lat (1 of 2)]
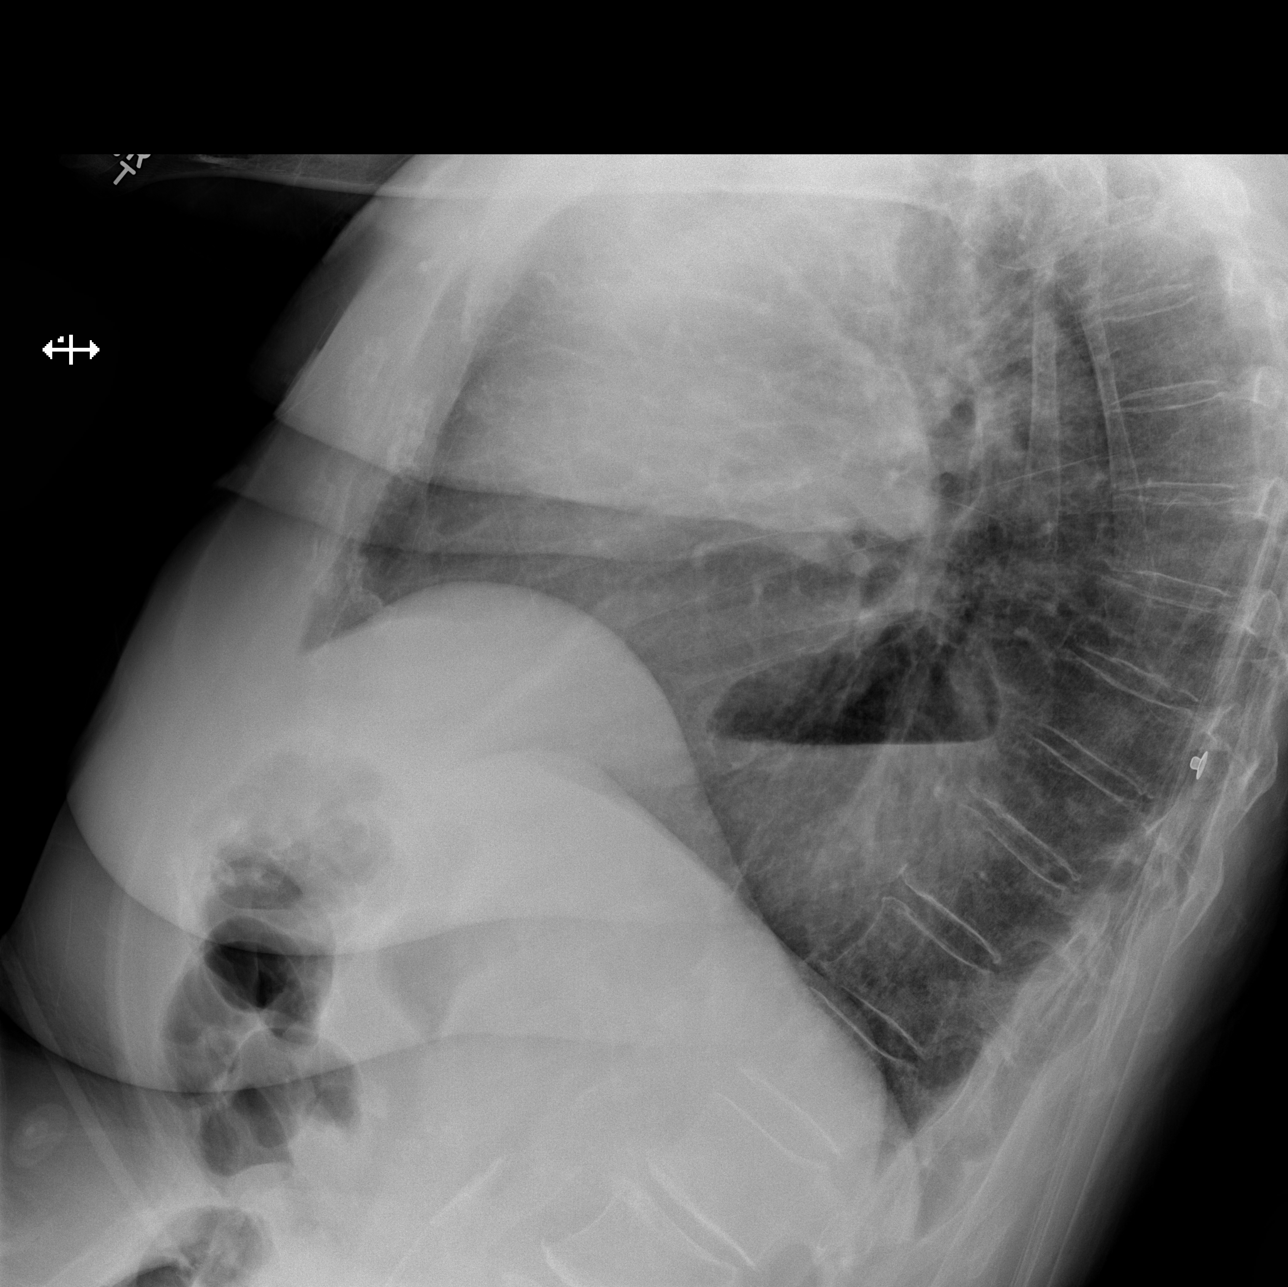

[w chest lat (2 of 2)]
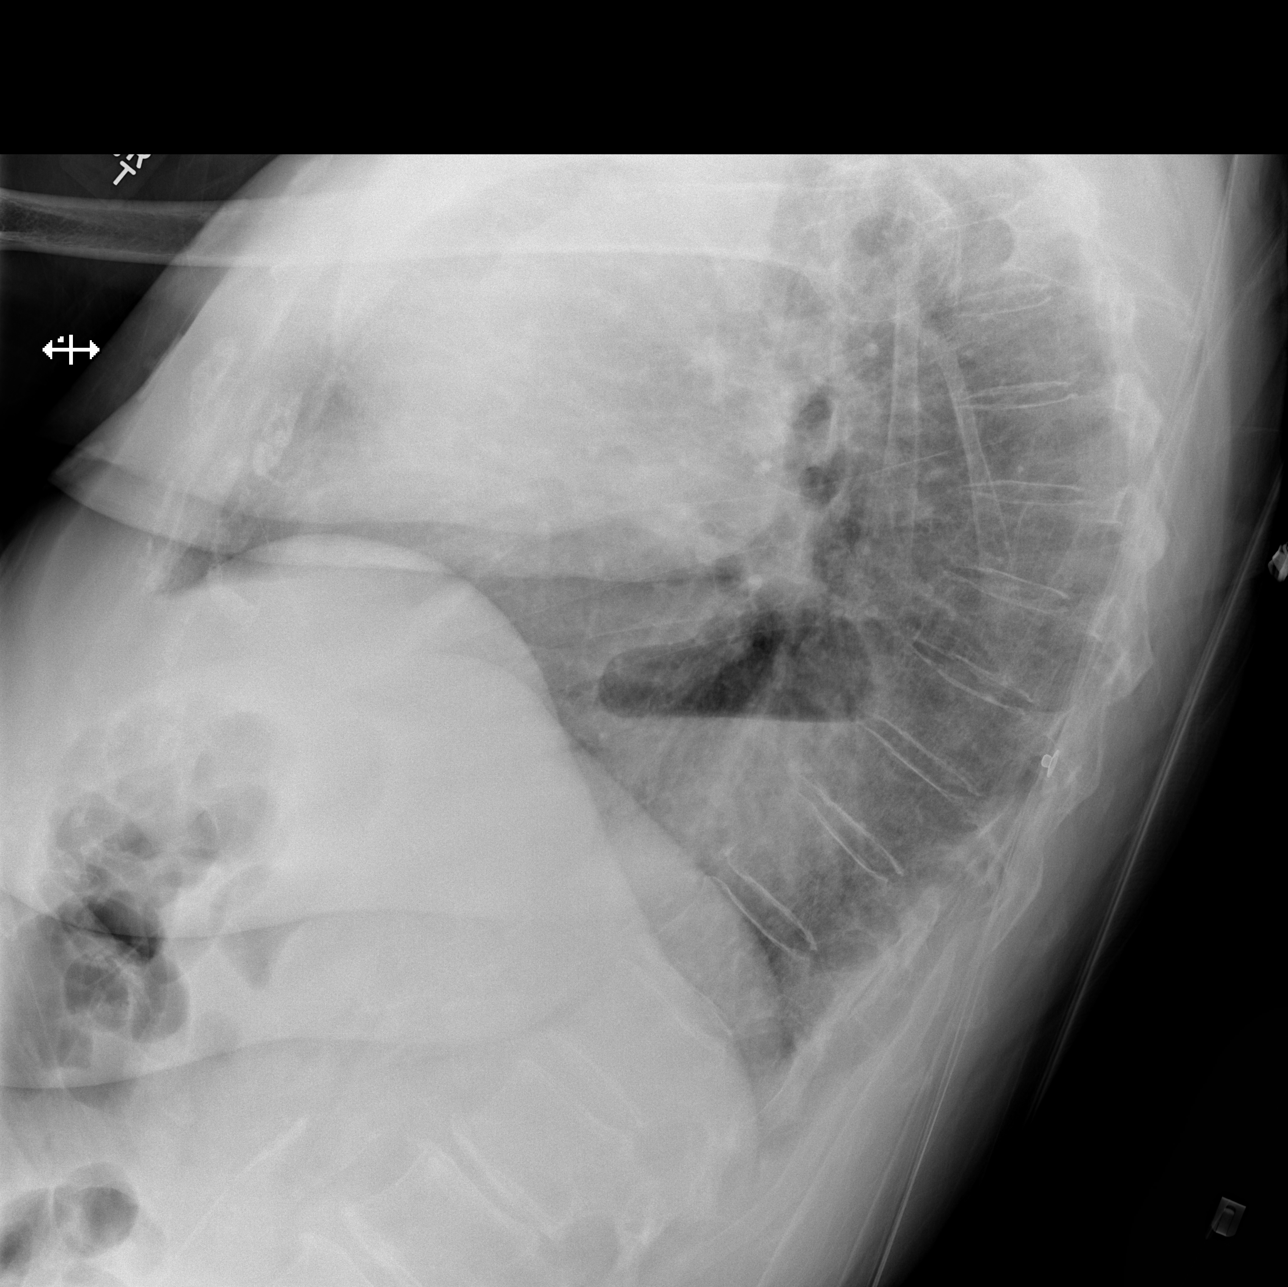

[x chest ap]
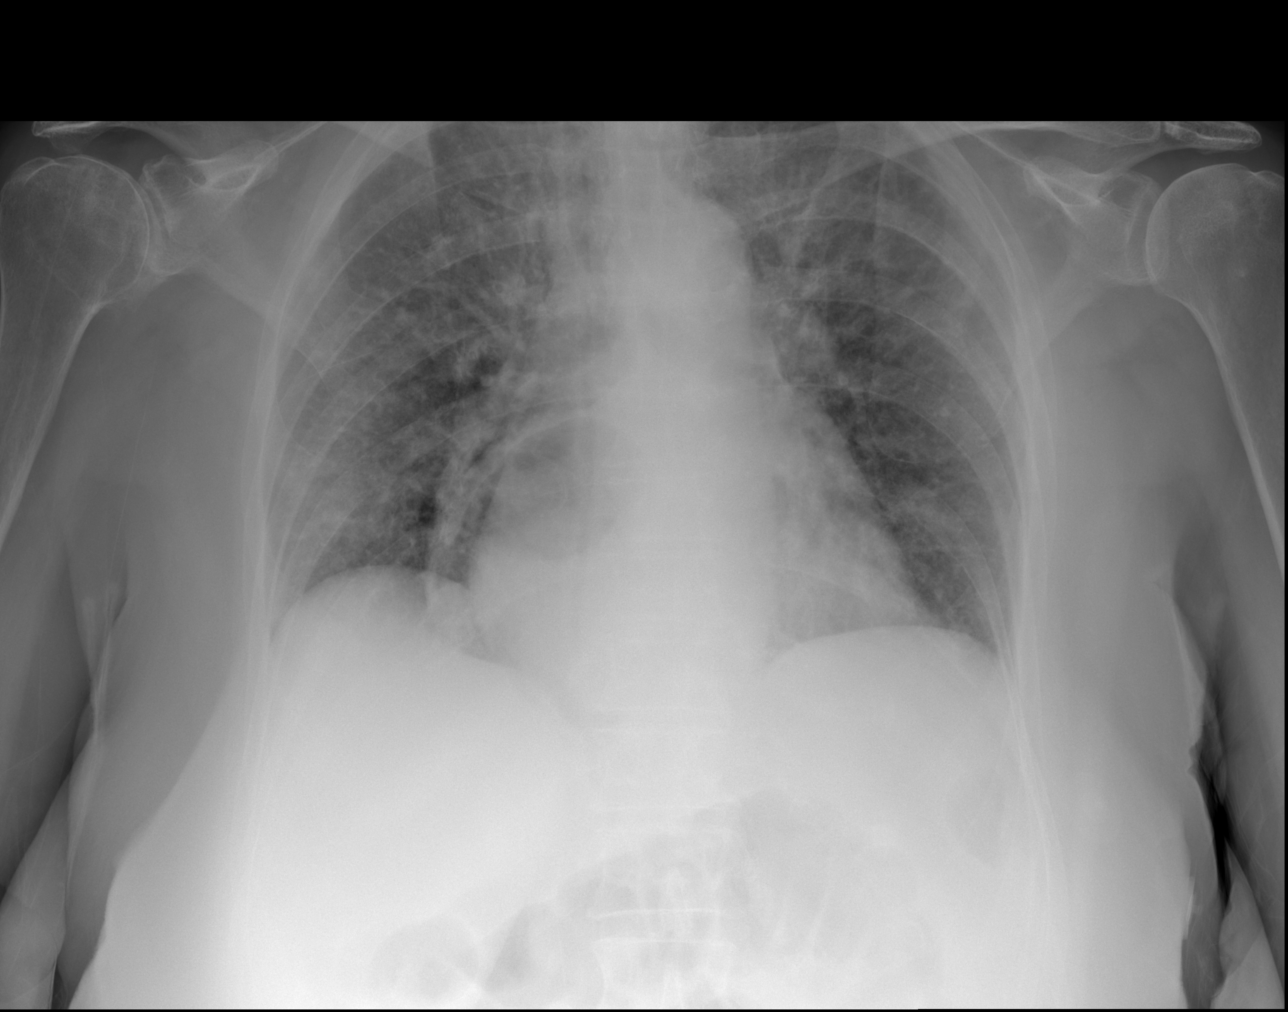

[3 of 3 positions shown; findings below may reference images not displayed]

FINDINGS: Normal cardiac silhouette. Large hiatal hernia posterior the heart.
There is peripheral linear interstitial pattern similar to
comparison exam. No focal infiltrate. No pneumothorax.
IMPRESSION: 1. Interstitial edema.
2. Large hiatal hernia.

## 2015-10-04 MED ORDER — ONDANSETRON HCL 4 MG/2ML IJ SOLN
4.0000 mg | Freq: Once | INTRAMUSCULAR | Status: AC
  Administered 2015-10-04: 4 mg via INTRAVENOUS
  Filled 2015-10-04: qty 2

## 2015-10-04 MED ORDER — ACETAMINOPHEN 500 MG PO TABS
1000.0000 mg | ORAL_TABLET | Freq: Once | ORAL | Status: AC
  Administered 2015-10-04: 1000 mg via ORAL
  Filled 2015-10-04: qty 2

## 2015-10-04 MED ORDER — ONDANSETRON HCL 4 MG PO TABS: 4.0000 mg | ORAL_TABLET | Freq: Four times a day (QID) | ORAL | Status: DC

## 2015-10-04 MED ORDER — METOPROLOL TARTRATE 25 MG PO TABS
50.0000 mg | ORAL_TABLET | Freq: Once | ORAL | Status: AC
  Administered 2015-10-04: 50 mg via ORAL
  Filled 2015-10-04: qty 2

## 2015-10-04 MED ORDER — SODIUM CHLORIDE 0.9 % IV BOLUS (SEPSIS)
1000.0000 mL | Freq: Once | INTRAVENOUS | Status: AC
  Administered 2015-10-04: 1000 mL via INTRAVENOUS

## 2015-10-04 MED ORDER — METOPROLOL TARTRATE 50 MG PO TABS: 50.0000 mg | ORAL_TABLET | Freq: Two times a day (BID) | ORAL | Status: DC

## 2015-10-04 NOTE — ED Provider Notes (Addendum)
CSN: [id number]     Arrival date & time 10/04/15  1136 History   First MD Initiated Contact with Patient 10/04/15 1138     Chief Complaint  Patient presents with  . Atrial Fibrillation     (Consider location/radiation/quality/duration/timing/severity/associated sxs/prior Treatment) HPI 82 year old female who complaints with weakness and palpitations. She has a history of atrial fibrillation on Coumadin, hypothyroidism, hypertension, and hyperlipidemia. States that over the past week she has had nausea, vomiting, diarrhea and decreased by mouth intake. Noticed some slight palpitations in her chest yesterday evening but denies any chest pain or shortness of breath. States that over the past month has had more generalized weakness and fatigue. No lower extremity edema, abdominal distention, orthopnea or PND. No fevers or chills, cough, abdominal pain, dysuria or urinary frequency. States that she ran out of some of her medications about a month ago, and has stopped taking her blood pressure medications and heart rate controlling medications. Still has enough Coumadin and thyroid medication which she has continued to take. Past Medical History  Diagnosis Date  . Dizziness     chronic and of an unclear etiology  . Hypothyroidism   . HTN (hypertension)   . Hyperlipemia   . Obesity   . Osteoporosis   . Gastritis   . Vitamin D deficiency   . Atrial tachycardia ([place])     ablated 11/17/10  by JA  from the [facility] of the aorta  . Atrial flutter ([place])     typical appearing  . Atrial fibrillation ([place])   . GERD (gastroesophageal reflux disease)    Past Surgical History  Procedure Laterality Date  . Atrial ablation surgery  11/17/10    Atrial tachycardia arising from [facility] of the aorta ablated by JA   Family History  Problem Relation Age of Onset  . Heart attack Father   . Hypertension Father    Social History  Substance Use Topics  . Smoking status: Never Smoker   . Smokeless tobacco: Never Used   . Alcohol Use: No   OB History    No data available     Review of Systems 10/14 systems reviewed and are negative other than those stated in the HPI   Allergies  Iodinated diagnostic agents  Home Medications   Prior to Admission medications   Medication Sig Start Date End Date Taking? Authorizing Provider  acetaminophen (TYLENOL) 325 MG tablet Take 650 mg by mouth every 4 (four) hours as needed. Reported on 10/04/2015   Yes Historical Provider, MD  Calcium Carbonate-Vitamin D (CALCIUM-VITAMIN D) 600-200 MG-UNIT CAPS Take 1 capsule by mouth daily. Reported on 10/04/2015   Yes Historical Provider, MD  Cholecalciferol (VITAMIN D3) 1000 UNITS CAPS Take 1 capsule by mouth daily. Reported on 10/04/2015   Yes Historical Provider, MD  clonazePAM (KLONOPIN) 0.5 MG tablet Take 0.5 mg by mouth 2 (two) times daily.    Yes Historical Provider, MD  furosemide (LASIX) 20 MG tablet Take 1 tablet (20 mg total) by mouth as directed. TAKE 20 MG 2 DAYS A WEEK 09/24/14  Yes Jettie Booze, MD  levothyroxine (SYNTHROID, LEVOTHROID) 88 MCG tablet Take 88 mcg by mouth daily.   Yes Historical Provider, MD  metoprolol tartrate (LOPRESSOR) 50 MG tablet Take 1 tablet (50 mg total) by mouth 2 (two) times daily. 09/24/14  Yes Jettie Booze, MD  warfarin (COUMADIN) 5 MG tablet Take 1 tablet (5 mg total) by mouth daily. 07/03/15  Yes Jettie Booze, MD  metoprolol Tonia [place])  50 MG tablet Take 1 tablet (50 mg total) by mouth 2 (two) times daily. 10/04/15   Forde Dandy, MD  oxyCODONE-acetaminophen (PERCOCET/ROXICET) 5-325 MG tablet Take 2 tablets by mouth every 4 (four) hours as needed for severe pain. Patient not taking: Reported on 10/04/2015 07/09/15   Aldona Bar Tripp Dowless, PA-C   BP 116/98 mmHg  Pulse 93  Temp(Src) 98 F (36.7 C) (Oral)  Resp 25  Ht 5\' 8"  (1.727 m)  Wt 230 lb (104.327 kg)  BMI 34.98 kg/m2  SpO2 98% Physical Exam Physical Exam  Nursing note and vitals reviewed. Constitutional:  Well developed, well nourished, non-toxic, and in no acute distress Head: Normocephalic and atraumatic.  Mouth/Throat: Oropharynx is clear and mucous membranes appear dry.  Neck: Normal range of motion. Neck supple.  Cardiovascular: Normal rate and regular rhythm.   Pulmonary/Chest: Effort normal and breath sounds normal.  Abdominal: Soft. There is no tenderness. There is no rebound and no guarding.  Musculoskeletal: Normal range of motion.  Neurological: Alert, upper extremity resting tremoring (baseline), no facial droop, fluent speech, moves all extremities symmetrically Skin: Skin is warm and dry.  Psychiatric: Cooperative  ED Course  Procedures (including critical care time) Labs Review Labs Reviewed  BASIC METABOLIC PANEL - Abnormal; Notable for the following:    CO2 19 (*)    Creatinine, Ser 1.10 (*)    GFR calc non Af Amer 46 (*)    GFR calc Af Amer 53 (*)    All other components within normal limits  PROTIME-INR - Abnormal; Notable for the following:    Prothrombin Time 27.0 (*)    INR 2.54 (*)    All other components within normal limits  CBC  MAGNESIUM  TSH  DIFFERENTIAL  I-STAT TROPOININ, ED    Imaging Review Dg Chest 2 View  10/04/2015  CLINICAL DATA:  Weakness for 2 days EXAM: CHEST  2 VIEW COMPARISON:  08/07/2012 FINDINGS: Normal cardiac silhouette. Large hiatal hernia posterior the heart. There is peripheral linear interstitial pattern similar to comparison exam. No focal infiltrate. No pneumothorax. IMPRESSION: 1. Interstitial edema. 2. Large hiatal hernia. Electronically Signed   By: Suzy Bouchard M.D.   On: 10/04/2015 13:48   I have personally reviewed and evaluated these images and lab results as part of my medical decision-making.   EKG Interpretation   Date/Time:  Saturday October 04 2015 11:53:41 EDT Ventricular Rate:  121 PR Interval:    QRS Duration: 92 QT Interval:  326 QTC Calculation: 462 R Axis:   29 Text Interpretation:  Atrial  fibrillation Repol abnrm suggests ischemia,  inferior leads Artifact in lead(s) II III aVR aVL aVF V1 V2 V4 V5 V6 last  in sinus rhythm on EKG in 2014 Confirmed by Neenah Canter MD, Jaelen Soth [id number]) on  10/04/2015 12:22:08 PM      MDM   Final diagnoses:  Persistent atrial fibrillation (HCC)  Nausea vomiting and diarrhea    82 year old female with history of persistent atrial fibrillation on coumadin who presents with atrial fibrillation with RVR in setting of medication noncompliance and N/V/D. In afib w/ RVR at 120s on arrival, normotensive, afebrile, in no respiratory distress. Appear dry on exam. Soft and nontender abdomen. afib likely due to medication noncompliance and mild dehydration. Given IVF and home metoprolol PO with rate controlled to HR in 90s. Mild AKI 1.1 from baseline 0.9. Improved after antiemetics. Suspect benign GI illness. Tolerates PO intake. Will ambulate here in ED, if stable and feels improved  may be discharged with close outpatient follow-up. If unsteady or feels unwell, may require observation admission. Strict return and follow-up instructions reviewed. Signed out to Dr. Alfonse Spruce.    This patients CHA2DS2-VASc Score and unadjusted Ischemic Stroke Rate (% per year) is equal to 4.8 % stroke rate/year from a score of 4  Above score calculated as 1 point each if present [CHF, HTN, DM, Vascular=MI/PAD/Aortic Plaque, Age if 65-74, or Female] Above score calculated as 2 points each if present [Age > 75, or Stroke/TIA/TE]    Forde Dandy, MD 10/04/15 [place] Anija Brickner, MD 10/06/15 1125

## 2015-10-04 NOTE — Discharge Instructions (Signed)
Atrial Fibrillation °Atrial fibrillation is a type of irregular or rapid heartbeat (arrhythmia). In atrial fibrillation, the heart quivers continuously in a chaotic pattern. This occurs when parts of the heart receive disorganized signals that make the heart unable to pump blood normally. This can increase the risk for stroke, heart failure, and other heart-related conditions. There are different types of atrial fibrillation, including: °· Paroxysmal atrial fibrillation. This type starts suddenly, and it usually stops on its own shortly after it starts. °· Persistent atrial fibrillation. This type often lasts longer than a week. It may stop on its own or with treatment. °· Long-lasting persistent atrial fibrillation. This type lasts longer than 12 months. °· Permanent atrial fibrillation. This type does not go away. °Talk with your health care provider to learn about the type of atrial fibrillation that you have. °CAUSES °This condition is caused by some heart-related conditions or procedures, including: °· A heart attack. °· Coronary artery disease. °· Heart failure. °· Heart valve conditions. °· High blood pressure. °· Inflammation of the sac that surrounds the heart (pericarditis). °· Heart surgery. °· Certain heart rhythm disorders, such as Wolf-Parkinson-White syndrome. °Other causes include: °· Pneumonia. °· Obstructive sleep apnea. °· Blockage of an artery in the lungs (pulmonary embolism, or PE). °· Lung cancer. °· Chronic lung disease. °· Thyroid problems, especially if the thyroid is overactive (hyperthyroidism). °· Caffeine. °· Excessive alcohol use or illegal drug use. °· Use of some medicines, including certain decongestants and diet pills. °Sometimes, the cause cannot be found. °RISK FACTORS °This condition is more likely to develop in: °· People who are older in age. °· People who smoke. °· People who have diabetes mellitus. °· People who are overweight (obese). °· Athletes who exercise  vigorously. °SYMPTOMS °Symptoms of this condition include: °· A feeling that your heart is beating rapidly or irregularly. °· A feeling of discomfort or pain in your chest. °· Shortness of breath. °· Sudden light-headedness or weakness. °· Getting tired easily during exercise. °In some cases, there are no symptoms. °DIAGNOSIS °Your health care provider may be able to detect atrial fibrillation when taking your pulse. If detected, this condition may be diagnosed with: °· An electrocardiogram (ECG). °· A Holter monitor test that records your heartbeat patterns over a 24-hour period. °· Transthoracic echocardiogram (TTE) to evaluate how blood flows through your heart. °· Transesophageal echocardiogram (TEE) to view more detailed images of your heart. °· A stress test. °· Imaging tests, such as a CT scan or chest X-ray. °· Blood tests. °TREATMENT °The main goals of treatment are to prevent blood clots from forming and to keep your heart beating at a normal rate and rhythm. The type of treatment that you receive depends on many factors, such as your underlying medical conditions and how you feel when you are experiencing atrial fibrillation. °This condition may be treated with: °· Medicine to slow down the heart rate, bring the heart's rhythm back to normal, or prevent clots from forming. °· Electrical cardioversion. This is a procedure that resets your heart's rhythm by delivering a controlled, low-energy shock to the heart through your skin. °· Different types of ablation, such as catheter ablation, catheter ablation with pacemaker, or surgical ablation. These procedures destroy the heart tissues that send abnormal signals. When the pacemaker is used, it is placed under your skin to help your heart beat in a regular rhythm. °HOME CARE INSTRUCTIONS °· Take over-the counter and prescription medicines only as told by your health care provider. °·   If your health care provider prescribed a blood-thinning medicine  (anticoagulant), take it exactly as told. Taking too much blood-thinning medicine can cause bleeding. If you do not take enough blood-thinning medicine, you will not have the protection that you need against stroke and other problems.  Do not use tobacco products, including cigarettes, chewing tobacco, and e-cigarettes. If you need help quitting, ask your health care provider.  If you have obstructive sleep apnea, manage your condition as told by your health care provider.  Do not drink alcohol.  Do not drink beverages that contain caffeine, such as coffee, soda, and tea.  Maintain a healthy weight. Do not use diet pills unless your health care provider approves. Diet pills may make heart problems worse.  Follow diet instructions as told by your health care provider.  Exercise regularly as told by your health care provider.  Keep all follow-up visits as told by your health care provider. This is important. PREVENTION  Avoid drinking beverages that contain caffeine or alcohol.  Avoid certain medicines, especially medicines that are used for breathing problems.  Avoid certain herbs and herbal medicines, such as those that contain ephedra or ginseng.  Do not use illegal drugs, such as cocaine and amphetamines.  Do not smoke.  Manage your high blood pressure. SEEK MEDICAL CARE IF:  You notice a change in the rate, rhythm, or strength of your heartbeat.  You are taking an anticoagulant and you notice increased bruising.  You tire more easily when you exercise or exert yourself. SEEK IMMEDIATE MEDICAL CARE IF:  You have chest pain, abdominal pain, sweating, or weakness.  You feel nauseous.  You notice blood in your vomit, bowel movement, or urine.  You have shortness of breath.  You suddenly have swollen feet and ankles.  You feel dizzy.  You have sudden weakness or numbness of the face, arm, or leg, especially on one side of the body.  You have trouble speaking,  trouble understanding, or both (aphasia).  Your face or your eyelid droops on one side. These symptoms may represent a serious problem that is an emergency. Do not wait to see if the symptoms will go away. Get medical help right away. Call your local emergency services (911 in the [place] Do not drive yourself to the hospital.   This information is not intended to replace advice given to you by your health care provider. Make sure you discuss any questions you have with your health care provider.   Document Released: 05/31/2005 Document Revised: 02/19/2015 Document Reviewed: 09/25/2014 Elsevier Interactive Patient Education 2016 [employer]  Nausea and Vomiting Nausea means you feel sick to your stomach. Throwing up (vomiting) is a reflex where stomach contents come out of your mouth. HOME CARE   Take medicine as told by your doctor.  Do not force yourself to eat. However, you do need to drink fluids.  If you feel like eating, eat a normal diet as told by your doctor.  Eat rice, wheat, potatoes, bread, lean meats, yogurt, fruits, and vegetables.  Avoid high-fat foods.  Drink enough fluids to keep your pee (urine) clear or pale yellow.  Ask your doctor how to replace body fluid losses (rehydrate). Signs of body fluid loss (dehydration) include:  Feeling very thirsty.  Dry lips and mouth.  Feeling dizzy.  Dark pee.  Peeing less than normal.  Feeling confused.  Fast breathing or heart rate. GET HELP RIGHT AWAY IF:   You have blood in your throw up.  You  have black or bloody poop (stool).  You have a bad headache or stiff neck.  You feel confused.  You have bad belly (abdominal) pain.  You have chest pain or trouble breathing.  You do not pee at least once every 8 hours.  You have cold, clammy skin.  You keep throwing up after 24 to 48 hours.  You have a fever. MAKE SURE YOU:   Understand these instructions.  Will watch your condition.  Will get help  right away if you are not doing well or get worse.   This information is not intended to replace advice given to you by your health care provider. Make sure you discuss any questions you have with your health care provider.   Document Released: 11/17/2007 Document Revised: 08/23/2011 Document Reviewed: 10/30/2010 Elsevier Interactive Patient Education 2016 [employer].  Diarrhea Diarrhea is watery poop (stool). It can make you feel weak, tired, thirsty, or give you a dry mouth (signs of dehydration). Watery poop is a sign of another problem, most often an infection. It often lasts 2-3 days. It can last longer if it is a sign of something serious. Take care of yourself as told by your doctor. HOME CARE   Drink 1 cup (8 ounces) of fluid each time you have watery poop.  Do not drink the following fluids:  Those that contain simple sugars (fructose, glucose, galactose, lactose, sucrose, maltose).  Sports drinks.  Fruit juices.  Whole milk products.  Sodas.  Drinks with caffeine (coffee, tea, soda) or alcohol.  Oral rehydration solution may be used if the doctor says it is okay. You may make your own solution. Follow this recipe:   - teaspoon table salt.   teaspoon baking soda.   teaspoon salt substitute containing potassium chloride.  1 tablespoons sugar.  1 liter (34 ounces) of water.  Avoid the following foods:  High fiber foods, such as raw fruits and vegetables.  Nuts, seeds, and whole grain breads and cereals.   Those that are sweetened with sugar alcohols (xylitol, sorbitol, mannitol).  Try eating the following foods:  Starchy foods, such as rice, toast, pasta, low-sugar cereal, oatmeal, baked potatoes, crackers, and bagels.  Bananas.  Applesauce.  Eat probiotic-rich foods, such as yogurt and milk products that are fermented.  Wash your hands well after each time you have watery poop.  Only take medicine as told by your doctor.  Take a warm bath to  help lessen burning or pain from having watery poop. GET HELP RIGHT AWAY IF:   You cannot drink fluids without throwing up (vomiting).  You keep throwing up.  You have blood in your poop, or your poop looks black and tarry.  You do not pee (urinate) in 6-8 hours, or there is only a small amount of very dark pee.  You have belly (abdominal) pain that gets worse or stays in the same spot (localizes).  You are weak, dizzy, confused, or light-headed.  You have a very bad headache.  Your watery poop gets worse or does not get better.  You have a fever or lasting symptoms for more than 2-3 days.  You have a fever and your symptoms suddenly get worse. MAKE SURE YOU:   Understand these instructions.  Will watch your condition.  Will get help right away if you are not doing well or get worse.   This information is not intended to replace advice given to you by your health care provider. Make sure you discuss any questions  you have with your health care provider.   Document Released: 11/17/2007 Document Revised: 06/21/2014 Document Reviewed: 02/06/2012 Elsevier Interactive Patient Education [employer].

## 2015-10-04 NOTE — ED Notes (Signed)
Notified Dr. Alfonse Spruce that patient is ready to go.

## 2015-10-04 NOTE — Progress Notes (Signed)
By signing my name below I, Tereasa Coop, attest that this documentation has been prepared under the direction and in the presence of Delman Cheadle, MD. Electonically Signed. Tereasa Coop, Scribe 10/04/2015 at 11:04 AM  Subjective:    Patient ID: Charlotte Castro, female    DOB: 06/27/1932, 82 y.o.   MRN: [id number] Chief Complaint  Patient presents with  . Out of her mediation    been out of her medication for 1 month  . Emesis  . Weakness    noticed some weakness in her body  . Diarrhea    HPI Charlotte Castro is a 82 y.o. female who presents to the [facility] complaining of N/V/D for the past 3 days w/ weakness. Pt states she has been only able to eat crackers and has been able to hold down some fluids. Pt states that she ate a piece of toast this morning which she vomited up. Pt states her diarrhea is becoming more firm and less water than it was initially. Pt denies any abd pain, fever, or chills. Pt states her urine has been a dark color for the past 2 days and she has a h/o recurrent UTIs.  Pt also c/o CP - fluttering in center of chest below her sternal notch, tongue tingling, ShOB. Pt reports she has a baseline essential tremor which is now worsened due to anxiety about her medical problems.  Pt states she has been out of her medication. Pt states that the pharmacy would not fill her medication and she has been out of her medication for the past month.   History of A-fib for the past year. Pt put on warfarin 5 mg as she couldn't afford Eliquis. Followed by Dr Irish Lack. Was rate controlled on metoprolol 50 bid but has been out of med for several wks and reports a lot of frustration at not being able to obtain a refill.  When asked what medication she took this morning pt at first reported taking her metoprolol this morning, then pt stated that she only took tylenol this morning because she was out of her medications. Pt then, when asked again what medication she took  this morning, pt reported taking her thyroid medication this morning.  Pt consistently stated that she did take her warfarin yest afternoon.   Upon reviewing pt's medications, pt has been out of her metoprolol for the past 3 weeks.  Pt has been out of her levothyroxine for the past month, however it appears that pt should not be out due to her initially being given 90 pills. Pt is also out of her klonopin. Pt states she was taking one a day. Upon looking at pt's medications, it appears that pt should still have klonopin.   Pt lives alone. She is here w/ her son  Past Medical History  Diagnosis Date  . Dizziness     chronic and of an unclear etiology  . Hypothyroidism   . HTN (hypertension)   . Hyperlipemia   . Obesity   . Osteoporosis   . Gastritis   . Vitamin D deficiency   . Atrial tachycardia ([place])     ablated 11/17/10  by JA  from the [facility] of the aorta  . Atrial flutter ([place])     typical appearing  . Atrial fibrillation ([place])   . GERD (gastroesophageal reflux disease)     Current outpatient prescriptions:  .  clonazePAM (KLONOPIN) 0.5 MG tablet, Take 0.5 mg by mouth 2 (  two) times daily. , Disp: , Rfl:  .  levothyroxine (SYNTHROID, LEVOTHROID) 88 MCG tablet, Take 88 mcg by mouth daily., Disp: , Rfl:  .  metoprolol tartrate (LOPRESSOR) 50 MG tablet, Take 1 tablet (50 mg total) by mouth 2 (two) times daily., Disp: 60 tablet, Rfl: 11 .  warfarin (COUMADIN) 5 MG tablet, Take 1 tablet (5 mg total) by mouth daily., Disp: 30 tablet, Rfl: 3 .  acetaminophen (TYLENOL) 325 MG tablet, Take 650 mg by mouth every 4 (four) hours as needed. Reported on 10/04/2015, Disp: , Rfl:  .  Calcium Carbonate-Vitamin D (CALCIUM-VITAMIN D) 600-200 MG-UNIT CAPS, Take 1 capsule by mouth daily. Reported on 10/04/2015, Disp: , Rfl:  .  Cholecalciferol (VITAMIN D3) 1000 UNITS CAPS, Take 1 capsule by mouth daily. Reported on 10/04/2015, Disp: , Rfl:  .  furosemide (LASIX) 20 MG tablet, Take 1 tablet (20 mg total) by  mouth as directed. TAKE 20 MG 2 DAYS A WEEK (Patient not taking: Reported on 10/04/2015), Disp: 30 tablet, Rfl: 2 .  oxyCODONE-acetaminophen (PERCOCET/ROXICET) 5-325 MG tablet, Take 2 tablets by mouth every 4 (four) hours as needed for severe pain. (Patient not taking: Reported on 10/04/2015), Disp: 10 tablet, Rfl: 0  Allergies  Allergen Reactions  . Iodinated Diagnostic Agents Shortness Of Breath    headache   No flowsheet data found.     Review of Systems  Constitutional: Positive for diaphoresis, activity change, appetite change and fatigue. Negative for fever.  HENT: Positive for trouble swallowing (tongue numb). Negative for congestion.   Eyes: Negative for visual disturbance.  Respiratory: Positive for shortness of breath. Negative for cough and wheezing.   Cardiovascular: Positive for chest pain and palpitations. Negative for leg swelling.  Gastrointestinal: Positive for nausea, vomiting and diarrhea. Negative for abdominal pain and abdominal distention.  Genitourinary: Positive for hematuria and decreased urine volume. Negative for dysuria.  Skin: Negative for rash.  Neurological: Positive for tremors, weakness, light-headedness and numbness. Negative for speech difficulty and headaches.  Psychiatric/Behavioral: The patient is nervous/anxious.        Objective:  BP 152/92 mmHg  Pulse 124  Temp(Src) 98 F (36.7 C) (Oral)  Resp 18  SpO2 98%  Physical Exam  Constitutional: She appears well-developed and well-nourished. She appears distressed.  HENT:  Head: Normocephalic and atraumatic.  Eyes: Conjunctivae are normal. Pupils are equal, round, and reactive to light.  Neck: Neck supple.  Cardiovascular: An irregularly irregular rhythm present. Tachycardia present.   Pulses:      Dorsalis pedis pulses are 2+ on the right side, and 2+ on the left side.  Pulmonary/Chest: Tachypnea noted. She has no wheezes. She has no rhonchi. She has no rales.  Abdominal: Soft. Bowel sounds  are normal. There is no hepatosplenomegaly, splenomegaly or hepatomegaly. There is no tenderness. There is no CVA tenderness.  Musculoskeletal: Normal range of motion. She exhibits no edema.  Neurological: She is alert. She displays tremor.  Skin: Skin is warm. She is diaphoretic.  Psychiatric: Her mood appears anxious. Her speech is rapid and/or pressured. She is agitated. Thought content is paranoid. Cognition and memory are impaired. She expresses inappropriate judgment.  Nursing note and vitals reviewed.  EKG: a. Fib w/ RVR 120s-140s IV placed at [facility] left hand. EMS called for non-urgent als transport to [place]:  Pt has been out of her metoprolol for three weeks. Pt is on coumadin and reports taking it daily though unsure of absorption due  to freq emesis.  Pt is also out of her klonopin - pt states taking 1 tab daily in which case she should have 2 wks left Pt out of her levothyroxine - 90d supply on 2/7 so should have 2 wk left - pt unable to guess why she is short on meds - denies od or losing them, no med box at home.   1. Weakness   2. Atrial fibrillation with rapid ventricular response (H[facility]) - pt out of metoprolol for sev wks, currently not tolerating po due to vomiting so needs IV rate control  3. Hypothyroidism, unspecified hypothyroidism type   4. Noncompliance with medications   5. Dyspnea   6. Recurrent urinary tract infection   7. Nausea vomiting and diarrhea   8. Chest tightness   9. Stress disorder, acute     Orders Placed This Encounter  Procedures  . POCT glucose (manual entry)  . EKG 12-Lead     I personally performed the services described in this documentation, which was scribed in my presence. The recorded information has been reviewed and considered, and addended by me as needed.  Delman Cheadle, MD MPH

## 2015-10-04 NOTE — ED Notes (Signed)
MD at bedside. 

## 2015-10-04 NOTE — ED Notes (Signed)
Pt sent by UC for Afib.  Pt has hx but has been out of all medication for aprox. 2 months.  Pt reports hx of anxiety, tremor and afib.  Pt also c/o increased SOB x2 months.

## 2015-10-04 NOTE — ED Notes (Signed)
Pt. Ambulated to the bsc, gait steady.

## 2015-10-04 NOTE — ED Notes (Signed)
Patient left at this time with all belongings. 

## 2015-10-04 NOTE — Patient Instructions (Signed)
     IF you received an x-ray today, you will receive an invoice from [facility]. Please contact [facility] at [phone] with questions or concerns regarding your invoice.   IF you received labwork today, you will receive an invoice from [facility]. Please contact [facility] at [phone] with questions or concerns regarding your invoice.   Our billing staff will not be able to assist you with questions regarding bills from these companies.  You will be contacted with the lab results as soon as they are available. The fastest way to get your results is to activate your My Chart account. Instructions are located on the last page of this paperwork. If you have not heard from us regarding the results in 2 weeks, please contact this office.      

## 2015-10-07 ENCOUNTER — Telehealth: Payer: Self-pay | Admitting: Interventional Cardiology

## 2015-10-07 ENCOUNTER — Encounter (HOSPITAL_COMMUNITY): Payer: Self-pay | Admitting: Emergency Medicine

## 2015-10-07 ENCOUNTER — Emergency Department (HOSPITAL_COMMUNITY)
Admission: EM | Admit: 2015-10-07 | Discharge: 2015-10-07 | Disposition: A | Discharge status: Home / Self Care | Payer: Medicare Other | Source: Home / Self Care | Attending: Emergency Medicine | Admitting: Emergency Medicine

## 2015-10-07 DIAGNOSIS — Z7901 Long term (current) use of anticoagulants: Secondary | ICD-10-CM | POA: Insufficient documentation

## 2015-10-07 DIAGNOSIS — E669 Obesity, unspecified: Secondary | ICD-10-CM

## 2015-10-07 DIAGNOSIS — E039 Hypothyroidism, unspecified: Secondary | ICD-10-CM | POA: Insufficient documentation

## 2015-10-07 DIAGNOSIS — I4892 Unspecified atrial flutter: Secondary | ICD-10-CM | POA: Insufficient documentation

## 2015-10-07 DIAGNOSIS — R200 Anesthesia of skin: Secondary | ICD-10-CM

## 2015-10-07 DIAGNOSIS — R079 Chest pain, unspecified: Secondary | ICD-10-CM | POA: Insufficient documentation

## 2015-10-07 DIAGNOSIS — M810 Age-related osteoporosis without current pathological fracture: Secondary | ICD-10-CM

## 2015-10-07 DIAGNOSIS — E785 Hyperlipidemia, unspecified: Secondary | ICD-10-CM | POA: Insufficient documentation

## 2015-10-07 DIAGNOSIS — R112 Nausea with vomiting, unspecified: Secondary | ICD-10-CM

## 2015-10-07 DIAGNOSIS — K219 Gastro-esophageal reflux disease without esophagitis: Secondary | ICD-10-CM | POA: Insufficient documentation

## 2015-10-07 DIAGNOSIS — Z79899 Other long term (current) drug therapy: Secondary | ICD-10-CM

## 2015-10-07 DIAGNOSIS — R197 Diarrhea, unspecified: Secondary | ICD-10-CM | POA: Insufficient documentation

## 2015-10-07 DIAGNOSIS — I10 Essential (primary) hypertension: Secondary | ICD-10-CM

## 2015-10-07 DIAGNOSIS — I499 Cardiac arrhythmia, unspecified: Secondary | ICD-10-CM | POA: Insufficient documentation

## 2015-10-07 DIAGNOSIS — I4891 Unspecified atrial fibrillation: Secondary | ICD-10-CM

## 2015-10-07 DIAGNOSIS — K279 Peptic ulcer, site unspecified, unspecified as acute or chronic, without hemorrhage or perforation: Secondary | ICD-10-CM | POA: Diagnosis not present

## 2015-10-07 LAB — BASIC METABOLIC PANEL
Anion gap: 8 (ref 5–15)
BUN: 10 mg/dL (ref 6–20)
CO2: 24 mmol/L (ref 22–32)
Calcium: 10 mg/dL (ref 8.9–10.3)
Chloride: 109 mmol/L (ref 101–111)
Creatinine, Ser: 1.01 mg/dL — ABNORMAL HIGH (ref 0.44–1.00)
GFR calc Af Amer: 59 mL/min — ABNORMAL LOW (ref >60)
GFR calc non Af Amer: 51 mL/min — ABNORMAL LOW (ref >60)
Glucose, Bld: 98 mg/dL (ref 65–99)
Potassium: 4.2 mmol/L (ref 3.5–5.1)
Sodium: 141 mmol/L (ref 135–145)

## 2015-10-07 LAB — URINALYSIS, ROUTINE W REFLEX MICROSCOPIC
Bilirubin Urine: NEGATIVE
Glucose, UA: NEGATIVE mg/dL
Hgb urine dipstick: NEGATIVE
Ketones, ur: NEGATIVE mg/dL
Nitrite: NEGATIVE
Protein, ur: NEGATIVE mg/dL
Specific Gravity, Urine: 1.013 (ref 1.005–1.030)
pH: 8 (ref 5.0–8.0)

## 2015-10-07 LAB — URINE MICROSCOPIC-ADD ON

## 2015-10-07 LAB — CBC WITH DIFFERENTIAL/PLATELET
Basophils Absolute: 0 10*3/uL (ref 0.0–0.1)
Basophils Relative: 0 %
Eosinophils Absolute: 0.2 10*3/uL (ref 0.0–0.7)
Eosinophils Relative: 2 %
HCT: 42 % (ref 36.0–46.0)
Hemoglobin: 14.5 g/dL (ref 12.0–15.0)
Lymphocytes Relative: 11 %
Lymphs Abs: 1.1 10*3/uL (ref 0.7–4.0)
MCH: 32.3 pg (ref 26.0–34.0)
MCHC: 34.5 g/dL (ref 30.0–36.0)
MCV: 93.5 fL (ref 78.0–100.0)
Monocytes Absolute: 0.9 10*3/uL (ref 0.1–1.0)
Monocytes Relative: 9 %
Neutro Abs: 7.3 10*3/uL (ref 1.7–7.7)
Neutrophils Relative %: 78 %
Platelets: 176 10*3/uL (ref 150–400)
RBC: 4.49 MIL/uL (ref 3.87–5.11)
RDW: 13.8 % (ref 11.5–15.5)
WBC: 9.6 10*3/uL (ref 4.0–10.5)

## 2015-10-07 LAB — TROPONIN I
Troponin I: <0.03 ng/mL (ref <0.031)
Troponin I: 0.03 ng/mL (ref <0.031)

## 2015-10-07 LAB — PROTIME-INR
INR: 3.26 — ABNORMAL HIGH (ref 0.00–1.49)
Prothrombin Time: 32.6 seconds — ABNORMAL HIGH (ref 11.6–15.2)

## 2015-10-07 MED ORDER — LORAZEPAM 2 MG/ML IJ SOLN
0.5000 mg | Freq: Once | INTRAMUSCULAR | Status: AC
  Administered 2015-10-07: 0.5 mg via INTRAVENOUS
  Filled 2015-10-07: qty 1

## 2015-10-07 MED ORDER — PROMETHAZINE HCL 25 MG PO TABS: 12.5000 mg | ORAL_TABLET | Freq: Four times a day (QID) | ORAL | Status: DC | PRN

## 2015-10-07 NOTE — ED Notes (Signed)
Pt verbalized understanding of d/c instructions and has no further questions. Pt stable and NAD.  

## 2015-10-07 NOTE — ED Provider Notes (Signed)
CSN: [id number]     Arrival date & time 10/07/15  0827 History   First MD Initiated Contact with Patient 10/07/15 [phone]     Chief Complaint  Patient presents with  . Chest Pain  . Numbness     (Consider location/radiation/quality/duration/timing/severity/associated sxs/prior Treatment) HPI    82yF with nausea and dizziness. Ongoing symptoms for weeks. Recently seen in the ED for the same.  Found to be in afib with RVR and discharged with rate controlled. Reports persistent nausea. Worse in morning. Occasional loose stool. No blood in emesis or stool. No fever or chills. No abdominal pain.   Past Medical History  Diagnosis Date  . Dizziness     chronic and of an unclear etiology  . Hypothyroidism   . HTN (hypertension)   . Hyperlipemia   . Obesity   . Osteoporosis   . Gastritis   . Vitamin D deficiency   . Atrial tachycardia ([place])     ablated 11/17/10  by JA  from the [facility] of the aorta  . Atrial flutter ([place])     typical appearing  . Atrial fibrillation ([place])   . GERD (gastroesophageal reflux disease)    Past Surgical History  Procedure Laterality Date  . Atrial ablation surgery  11/17/10    Atrial tachycardia arising from [facility] of the aorta ablated by JA   Family History  Problem Relation Age of Onset  . Heart attack Father   . Hypertension Father    Social History  Substance Use Topics  . Smoking status: Never Smoker   . Smokeless tobacco: Never Used  . Alcohol Use: No   OB History    No data available     Review of Systems  All systems reviewed and negative, other than as noted in HPI.   Allergies  Iodinated diagnostic agents  Home Medications   Prior to Admission medications   Medication Sig Start Date End Date Taking? Authorizing Provider  acetaminophen (TYLENOL) 325 MG tablet Take 650 mg by mouth every 4 (four) hours as needed. Reported on 10/04/2015   Yes Historical Provider, MD  Calcium Carbonate-Vitamin D (CALCIUM-VITAMIN D) 600-200 MG-UNIT CAPS Take 1  capsule by mouth daily. Reported on 10/04/2015   Yes Historical Provider, MD  Cholecalciferol (VITAMIN D3) 1000 UNITS CAPS Take 1 capsule by mouth daily. Reported on 10/04/2015   Yes Historical Provider, MD  clonazePAM (KLONOPIN) 0.5 MG tablet Take 0.5 mg by mouth 2 (two) times daily.    Yes Historical Provider, MD  levothyroxine (SYNTHROID, LEVOTHROID) 88 MCG tablet Take 88 mcg by mouth daily.   Yes Historical Provider, MD  metoprolol tartrate (LOPRESSOR) 50 MG tablet Take 1 tablet (50 mg total) by mouth 2 (two) times daily. 09/24/14  Yes Jettie Booze, MD  ondansetron (ZOFRAN) 4 MG tablet Take 1 tablet (4 mg total) by mouth every 6 (six) hours. 10/04/15  Yes Harvel Quale, MD  warfarin (COUMADIN) 5 MG tablet Take 1 tablet (5 mg total) by mouth daily. 07/03/15  Yes Jettie Booze, MD  furosemide (LASIX) 20 MG tablet Take 1 tablet (20 mg total) by mouth as directed. TAKE 20 MG 2 DAYS A WEEK 09/24/14   Jettie Booze, MD  metoprolol (LOPRESSOR) 50 MG tablet Take 1 tablet (50 mg total) by mouth 2 (two) times daily. 10/04/15   Forde Dandy, MD  oxyCODONE-acetaminophen (PERCOCET/ROXICET) 5-325 MG tablet Take 2 tablets by mouth every 4 (four) hours as needed for severe pain. Patient not taking:  Reported on 10/04/2015 07/09/15   Samantha Tripp Dowless, PA-C   BP 153/89 mmHg  Pulse 86  Temp(Src) 97.6 F (36.4 C) (Oral)  Resp 15  SpO2 97% Physical Exam  Constitutional: She appears well-developed and well-nourished. No distress.  HENT:  Head: Normocephalic and atraumatic.  Eyes: Conjunctivae are normal. Right eye exhibits no discharge. Left eye exhibits no discharge.  Neck: Neck supple.  Cardiovascular: Normal rate and normal heart sounds.  Exam reveals no gallop and no friction rub.   No murmur heard. irreg irreg  Pulmonary/Chest: Effort normal and breath sounds normal. No respiratory distress.  Abdominal: Soft. She exhibits no distension. There is no tenderness.  Musculoskeletal: She  exhibits no edema or tenderness.  Neurological: She is alert.  Skin: Skin is warm and dry.  Psychiatric: She has a normal mood and affect. Her behavior is normal. Thought content normal.  Nursing note and vitals reviewed.   ED Course  Procedures (including critical care time) Labs Review Labs Reviewed  BASIC METABOLIC PANEL - Abnormal; Notable for the following:    Creatinine, Ser 1.01 (*)    GFR calc non Af Amer 51 (*)    GFR calc Af Amer 59 (*)    All other components within normal limits  URINALYSIS, ROUTINE W REFLEX MICROSCOPIC (NOT AT [facility]) - Abnormal; Notable for the following:    Leukocytes, UA SMALL (*)    All other components within normal limits  PROTIME-INR - Abnormal; Notable for the following:    Prothrombin Time 32.6 (*)    INR 3.26 (*)    All other components within normal limits  URINE MICROSCOPIC-ADD ON - Abnormal; Notable for the following:    Squamous Epithelial / LPF 0-5 (*)    Bacteria, UA MANY (*)    All other components within normal limits  CBC WITH DIFFERENTIAL/PLATELET  TROPONIN I  TROPONIN I    Imaging Review No results found. I have personally reviewed and evaluated these images and lab results as part of my medical decision-making.   EKG Interpretation   Date/Time:  Tuesday October 07 2015 08:37:59 EDT Ventricular Rate:  92 PR Interval:    QRS Duration: 93 QT Interval:  373 QTC Calculation: 461 R Axis:   75 Text Interpretation:  Atrial fibrillation Confirmed by Kamari Bilek  MD, Calene Paradiso  ([id number]) on 10/07/2015 9:42:21 AM      MDM   Final diagnoses:  Chest pain, unspecified chest pain type  Nausea vomiting and diarrhea    82 year old female with nausea. Also vague chest pain. Atypical for ACS. She is atrial fibrillation with a controlled rate. She has a history the same. Her abdominal exam is benign. No vomiting the emergency room. Handling by mouth. We'll discharge with Phenergan. Outpatient follow-up. Return precautions  discussed.    Virgel Manifold, MD 10/15/15 1256

## 2015-10-07 NOTE — Telephone Encounter (Signed)
Charlotte Castro is calling about some paper work in which needs to be completed for her mother . States that we have the paperwork that needs to go to the [facility] .

## 2015-10-07 NOTE — ED Notes (Signed)
Used bedside commode with some assistance

## 2015-10-07 NOTE — Telephone Encounter (Signed)
**Note De-Identified  Obfuscation** Tammy, the pts daughter, states that she was advised by the [facility] RN (?)that Dr Irish Lack could fill out the pts DMV forms. I explained to Tammy that when her Mother came to our office on 4/20 that we (Me and Maudie Mercury from medical records) explained to her that she needed to take her DMV forms to her PCP as Dr Irish Lack is her cardiologist and cannot comment on questions concerning anything other than her heart.  I advised Tammy that on the day that the pt was in our office wanting her DMV paperwork filled out that she stated that she did not have a PCP so I called her PCPs (Dr. Antony Blackbird) office and was advised that the pt is a pt there and was seen in October of 2017. Then the pt stated that she did not want to go to see her PCP because she was going to fire her as someone diagnosed her with Parkinson's disease at that office or another office that the pt went to. I did advise her to have her PCP fill out her paperwork before she fires her. At that time, The pt verbalized understanding and stated that she was going to her PCPs office when she left our office that day.  Tammy is advised to refer to the pts PCP , Dr Antony Blackbird, as the questions on the [facility] form has very little to do with the pts heart. Tammy verbalized understanding and she stated that she would get in touch with Dr Christophe Louis.

## 2015-10-07 NOTE — ED Notes (Addendum)
Pt in with c/o central chest pain, described as "tight" feeling and numbness in bilateral hands and feet. Reports that this pain started last night before bedtime. Pt states she was seen last Friday in ED with same cc. A&Ox3, periods of confusion.

## 2015-10-09 ENCOUNTER — Emergency Department (HOSPITAL_COMMUNITY): Payer: Medicare Other

## 2015-10-09 ENCOUNTER — Inpatient Hospital Stay (HOSPITAL_COMMUNITY)
Admission: EM | Admit: 2015-10-09 | Discharge: 2015-10-14 | DRG: 384 | Disposition: A | Discharge status: Home / Self Care | Payer: Medicare Other | Attending: Internal Medicine | Admitting: Internal Medicine

## 2015-10-09 ENCOUNTER — Observation Stay (HOSPITAL_COMMUNITY): Payer: Medicare Other

## 2015-10-09 ENCOUNTER — Encounter (HOSPITAL_COMMUNITY): Payer: Self-pay | Admitting: *Deleted

## 2015-10-09 DIAGNOSIS — R1110 Vomiting, unspecified: Secondary | ICD-10-CM | POA: Diagnosis present

## 2015-10-09 DIAGNOSIS — E559 Vitamin D deficiency, unspecified: Secondary | ICD-10-CM | POA: Diagnosis present

## 2015-10-09 DIAGNOSIS — E860 Dehydration: Secondary | ICD-10-CM | POA: Insufficient documentation

## 2015-10-09 DIAGNOSIS — K219 Gastro-esophageal reflux disease without esophagitis: Secondary | ICD-10-CM | POA: Diagnosis present

## 2015-10-09 DIAGNOSIS — M810 Age-related osteoporosis without current pathological fracture: Secondary | ICD-10-CM | POA: Diagnosis present

## 2015-10-09 DIAGNOSIS — N182 Chronic kidney disease, stage 2 (mild): Secondary | ICD-10-CM | POA: Diagnosis present

## 2015-10-09 DIAGNOSIS — R110 Nausea: Secondary | ICD-10-CM

## 2015-10-09 DIAGNOSIS — I4891 Unspecified atrial fibrillation: Secondary | ICD-10-CM | POA: Diagnosis present

## 2015-10-09 DIAGNOSIS — R112 Nausea with vomiting, unspecified: Secondary | ICD-10-CM | POA: Diagnosis not present

## 2015-10-09 DIAGNOSIS — E039 Hypothyroidism, unspecified: Secondary | ICD-10-CM | POA: Diagnosis present

## 2015-10-09 DIAGNOSIS — Z79899 Other long term (current) drug therapy: Secondary | ICD-10-CM

## 2015-10-09 DIAGNOSIS — N390 Urinary tract infection, site not specified: Secondary | ICD-10-CM | POA: Diagnosis present

## 2015-10-09 DIAGNOSIS — I10 Essential (primary) hypertension: Secondary | ICD-10-CM | POA: Diagnosis present

## 2015-10-09 DIAGNOSIS — E785 Hyperlipidemia, unspecified: Secondary | ICD-10-CM | POA: Diagnosis present

## 2015-10-09 DIAGNOSIS — E669 Obesity, unspecified: Secondary | ICD-10-CM | POA: Diagnosis present

## 2015-10-09 DIAGNOSIS — K449 Diaphragmatic hernia without obstruction or gangrene: Secondary | ICD-10-CM | POA: Diagnosis present

## 2015-10-09 DIAGNOSIS — Z6833 Body mass index (BMI) 33.0-33.9, adult: Secondary | ICD-10-CM

## 2015-10-09 DIAGNOSIS — I129 Hypertensive chronic kidney disease with stage 1 through stage 4 chronic kidney disease, or unspecified chronic kidney disease: Secondary | ICD-10-CM | POA: Diagnosis present

## 2015-10-09 DIAGNOSIS — Z7901 Long term (current) use of anticoagulants: Secondary | ICD-10-CM

## 2015-10-09 DIAGNOSIS — Z91041 Radiographic dye allergy status: Secondary | ICD-10-CM

## 2015-10-09 DIAGNOSIS — R000 Tachycardia, unspecified: Secondary | ICD-10-CM | POA: Diagnosis present

## 2015-10-09 DIAGNOSIS — B952 Enterococcus as the cause of diseases classified elsewhere: Secondary | ICD-10-CM | POA: Diagnosis present

## 2015-10-09 DIAGNOSIS — Z8249 Family history of ischemic heart disease and other diseases of the circulatory system: Secondary | ICD-10-CM

## 2015-10-09 DIAGNOSIS — K760 Fatty (change of) liver, not elsewhere classified: Secondary | ICD-10-CM | POA: Diagnosis present

## 2015-10-09 DIAGNOSIS — I482 Chronic atrial fibrillation: Secondary | ICD-10-CM | POA: Diagnosis present

## 2015-10-09 DIAGNOSIS — K2970 Gastritis, unspecified, without bleeding: Secondary | ICD-10-CM

## 2015-10-09 DIAGNOSIS — Z23 Encounter for immunization: Secondary | ICD-10-CM

## 2015-10-09 DIAGNOSIS — K279 Peptic ulcer, site unspecified, unspecified as acute or chronic, without hemorrhage or perforation: Principal | ICD-10-CM | POA: Diagnosis present

## 2015-10-09 LAB — HEPATIC FUNCTION PANEL
ALK PHOS: 71 U/L (ref 38–126)
ALT: 18 U/L (ref 14–54)
AST: 22 U/L (ref 15–41)
Albumin: 3.7 g/dL (ref 3.5–5.0)
BILIRUBIN INDIRECT: 0.8 mg/dL (ref 0.3–0.9)
Bilirubin, Direct: 0.3 mg/dL (ref 0.1–0.5)
Total Bilirubin: 1.1 mg/dL (ref 0.3–1.2)
Total Protein: 7 g/dL (ref 6.5–8.1)

## 2015-10-09 LAB — TSH: TSH: 3.141 u[IU]/mL (ref 0.350–4.500)

## 2015-10-09 LAB — PROTIME-INR
INR: 2.7 — ABNORMAL HIGH (ref 0.00–1.49)
PROTHROMBIN TIME: 28.3 s — AB (ref 11.6–15.2)

## 2015-10-09 LAB — CBC
HCT: 43.6 % (ref 36.0–46.0)
Hemoglobin: 15.1 g/dL — ABNORMAL HIGH (ref 12.0–15.0)
MCH: 32.4 pg (ref 26.0–34.0)
MCHC: 34.6 g/dL (ref 30.0–36.0)
MCV: 93.6 fL (ref 78.0–100.0)
PLATELETS: 194 10*3/uL (ref 150–400)
RBC: 4.66 MIL/uL (ref 3.87–5.11)
RDW: 13.8 % (ref 11.5–15.5)
WBC: 8.8 10*3/uL (ref 4.0–10.5)

## 2015-10-09 LAB — BASIC METABOLIC PANEL
Anion gap: 11 (ref 5–15)
BUN: 11 mg/dL (ref 6–20)
CALCIUM: 9.9 mg/dL (ref 8.9–10.3)
CHLORIDE: 109 mmol/L (ref 101–111)
CO2: 23 mmol/L (ref 22–32)
CREATININE: 1.01 mg/dL — AB (ref 0.44–1.00)
GFR calc non Af Amer: 51 mL/min — ABNORMAL LOW (ref >60)
GFR, EST AFRICAN AMERICAN: 59 mL/min — AB (ref >60)
Glucose, Bld: 131 mg/dL — ABNORMAL HIGH (ref 65–99)
Potassium: 4.4 mmol/L (ref 3.5–5.1)
SODIUM: 143 mmol/L (ref 135–145)

## 2015-10-09 LAB — URINALYSIS, ROUTINE W REFLEX MICROSCOPIC
BILIRUBIN URINE: NEGATIVE
GLUCOSE, UA: NEGATIVE mg/dL
KETONES UR: NEGATIVE mg/dL
Nitrite: NEGATIVE
PH: 7.5 (ref 5.0–8.0)
Protein, ur: NEGATIVE mg/dL
SPECIFIC GRAVITY, URINE: 1.01 (ref 1.005–1.030)

## 2015-10-09 LAB — T4, FREE: Free T4: 1.39 ng/dL — ABNORMAL HIGH (ref 0.61–1.12)

## 2015-10-09 LAB — TROPONIN I

## 2015-10-09 LAB — URINE MICROSCOPIC-ADD ON

## 2015-10-09 LAB — I-STAT TROPONIN, ED: TROPONIN I, POC: 0.01 ng/mL (ref 0.00–0.08)

## 2015-10-09 LAB — LIPASE, BLOOD: LIPASE: 23 U/L (ref 11–51)

## 2015-10-09 IMAGING — CR DG CHEST 2V
2 series · 2 of 2 positions shown · non-contrast
Comparison: [DATE]

CLINICAL DATA: Chest pain

EXAM:
CHEST  2 VIEW

[chest lat]
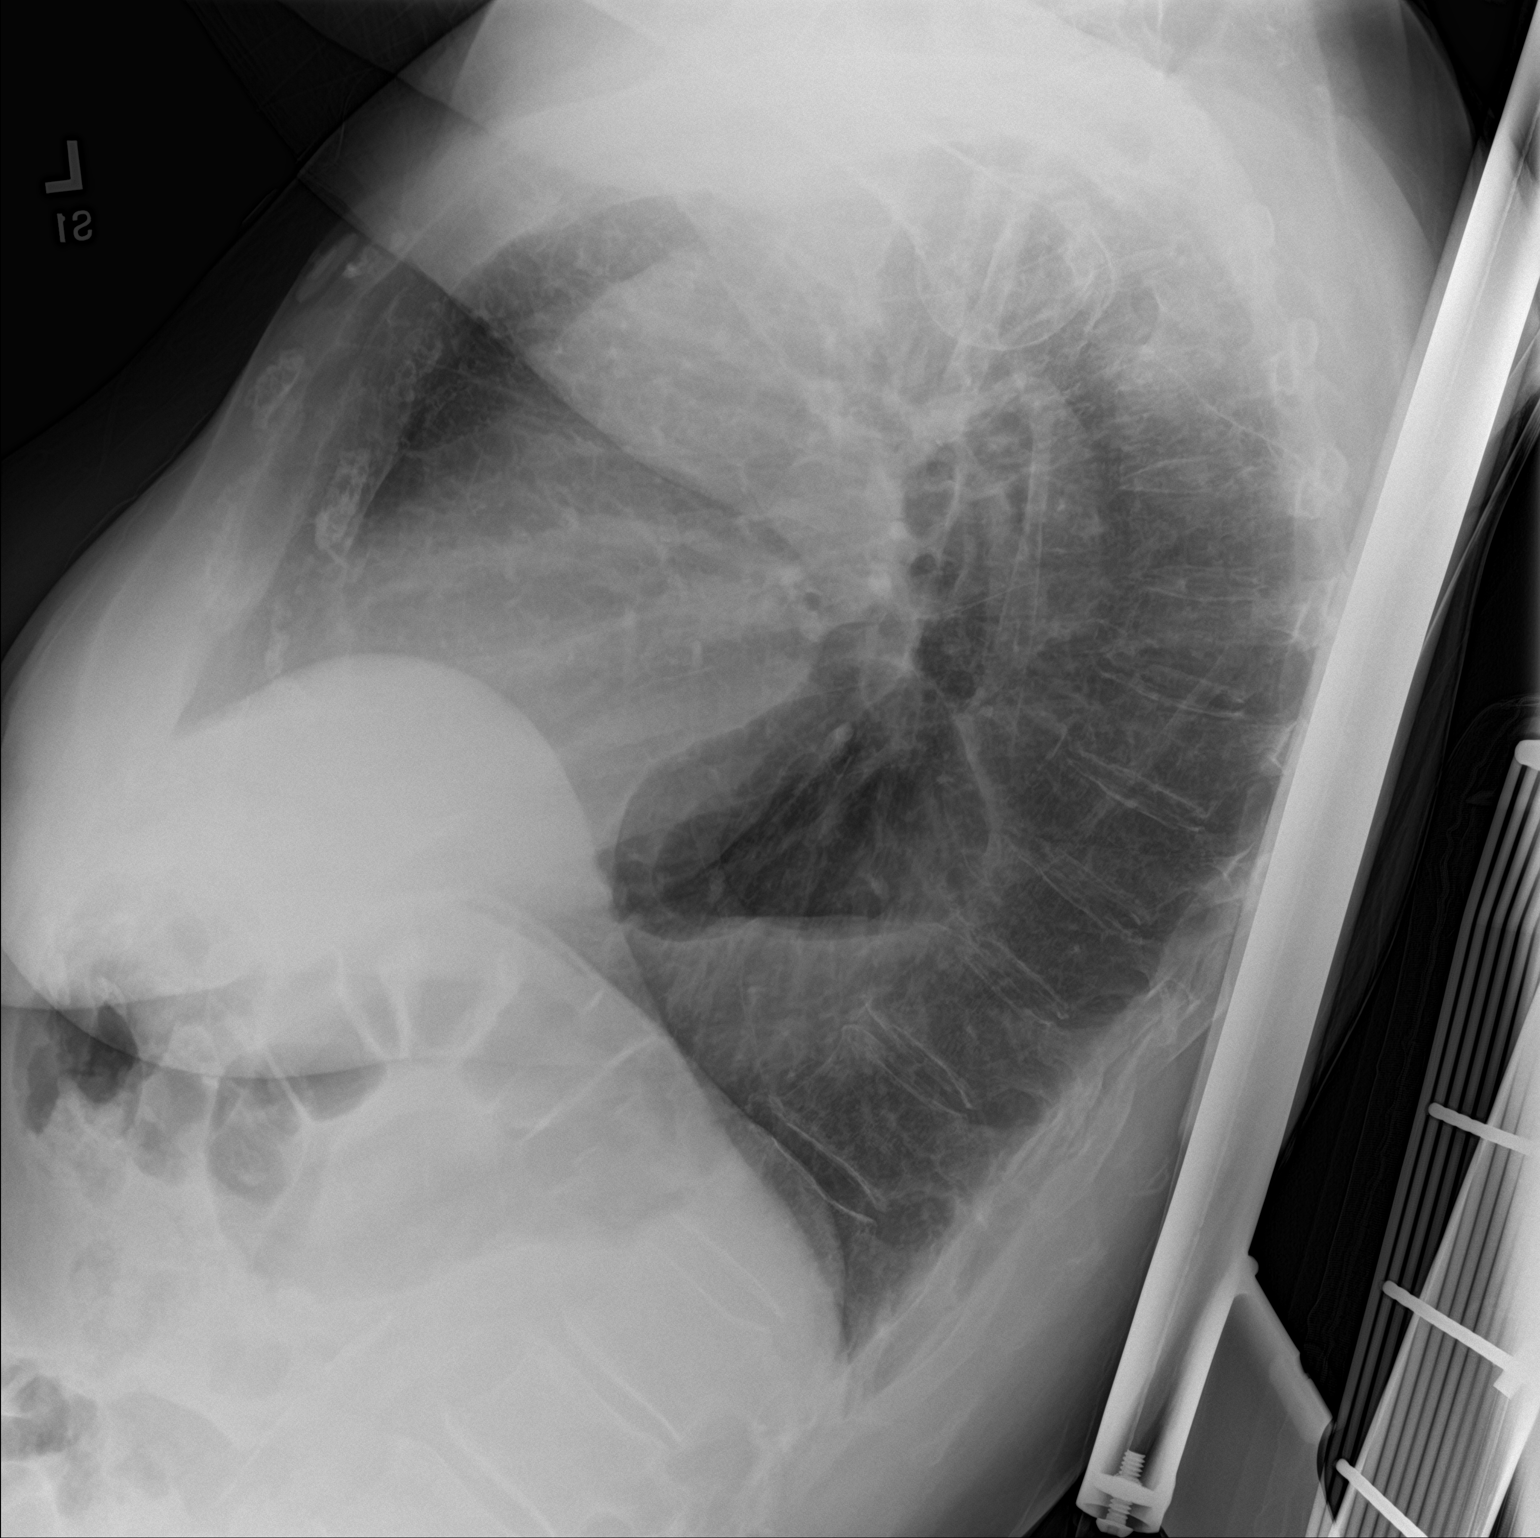

[chest ap]
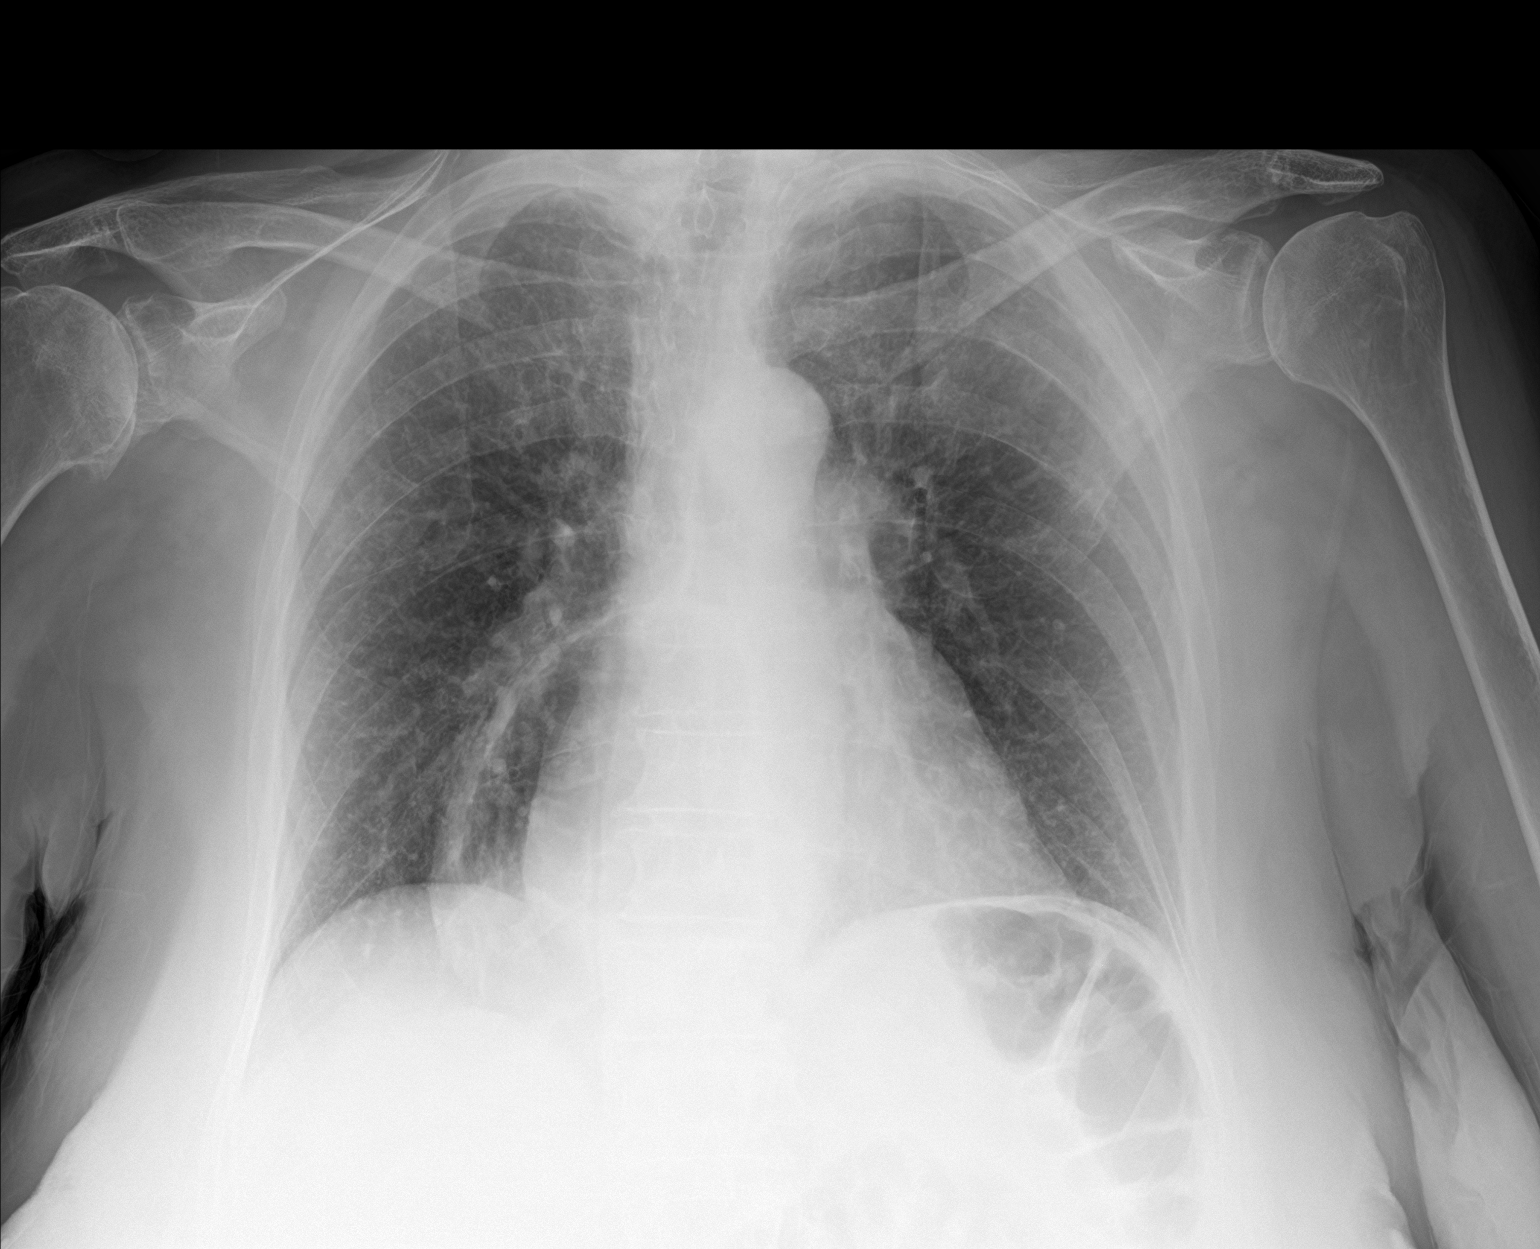

[2 of 2 positions shown; findings below may reference images not displayed]

FINDINGS: COPD with hyperinflation of the lungs. Elevated right hemidiaphragm.
Very large hiatal hernia with air-fluid level.

Lungs are clear without infiltrate or effusion. Negative for heart
failure.
IMPRESSION: COPD without acute cardiopulmonary abnormality

Large hiatal hernia.

## 2015-10-09 MED ORDER — ONDANSETRON HCL 4 MG/2ML IJ SOLN
4.0000 mg | Freq: Once | INTRAMUSCULAR | Status: AC
  Administered 2015-10-09: 4 mg via INTRAVENOUS
  Filled 2015-10-09: qty 2

## 2015-10-09 MED ORDER — DILTIAZEM HCL 100 MG IV SOLR
5.0000 mg/h | Freq: Once | INTRAVENOUS | Status: AC
  Administered 2015-10-09: 5 mg/h via INTRAVENOUS
  Filled 2015-10-09: qty 100

## 2015-10-09 MED ORDER — PANTOPRAZOLE SODIUM 40 MG IV SOLR
40.0000 mg | Freq: Once | INTRAVENOUS | Status: AC
  Administered 2015-10-09: 40 mg via INTRAVENOUS
  Filled 2015-10-09: qty 40

## 2015-10-09 MED ORDER — ADULT MULTIVITAMIN W/MINERALS CH
1.0000 | ORAL_TABLET | Freq: Every day | ORAL | Status: DC
  Administered 2015-10-10 – 2015-10-14 (×6): 1 via ORAL
  Filled 2015-10-09 (×6): qty 1

## 2015-10-09 MED ORDER — SODIUM CHLORIDE 0.9 % IV SOLN
INTRAVENOUS | Status: AC
  Administered 2015-10-10 (×2): via INTRAVENOUS

## 2015-10-09 MED ORDER — DEXTROSE 5 % IV SOLN
1.0000 g | Freq: Once | INTRAVENOUS | Status: AC
  Administered 2015-10-09: 1 g via INTRAVENOUS
  Filled 2015-10-09: qty 10

## 2015-10-09 MED ORDER — DEXTROSE 5 % IV SOLN
5.0000 mg/h | Freq: Once | INTRAVENOUS | Status: AC
  Administered 2015-10-10: 5 mg/h via INTRAVENOUS
  Filled 2015-10-09: qty 100

## 2015-10-09 MED ORDER — LORAZEPAM 2 MG/ML IJ SOLN
0.5000 mg | Freq: Once | INTRAMUSCULAR | Status: AC
Start: 2015-10-09 — End: 2015-10-09
  Administered 2015-10-09: 0.5 mg via INTRAVENOUS
  Filled 2015-10-09: qty 1

## 2015-10-09 MED ORDER — ACETAMINOPHEN 325 MG PO TABS
650.0000 mg | ORAL_TABLET | Freq: Four times a day (QID) | ORAL | Status: DC | PRN
  Administered 2015-10-14: 650 mg via ORAL
  Filled 2015-10-09: qty 2

## 2015-10-09 MED ORDER — WARFARIN SODIUM 5 MG PO TABS
2.5000 mg | ORAL_TABLET | Freq: Once | ORAL | Status: AC
  Administered 2015-10-10: 2.5 mg via ORAL
  Filled 2015-10-09: qty 1

## 2015-10-09 MED ORDER — CLONAZEPAM 0.5 MG PO TABS
0.2500 mg | ORAL_TABLET | Freq: Two times a day (BID) | ORAL | Status: DC
  Administered 2015-10-10 – 2015-10-14 (×10): 0.5 mg via ORAL
  Filled 2015-10-09 (×10): qty 1

## 2015-10-09 MED ORDER — LEVOTHYROXINE SODIUM 88 MCG PO TABS
88.0000 ug | ORAL_TABLET | Freq: Every day | ORAL | Status: DC
  Administered 2015-10-10 – 2015-10-14 (×5): 88 ug via ORAL
  Filled 2015-10-09 (×5): qty 1

## 2015-10-09 MED ORDER — SODIUM CHLORIDE 0.9 % IV BOLUS (SEPSIS)
1000.0000 mL | Freq: Once | INTRAVENOUS | Status: AC
  Administered 2015-10-09: 1000 mL via INTRAVENOUS

## 2015-10-09 MED ORDER — ONDANSETRON HCL 4 MG/2ML IJ SOLN
4.0000 mg | Freq: Four times a day (QID) | INTRAMUSCULAR | Status: DC | PRN
  Administered 2015-10-10 – 2015-10-12 (×3): 4 mg via INTRAVENOUS
  Filled 2015-10-09 (×3): qty 2

## 2015-10-09 MED ORDER — ACETAMINOPHEN 650 MG RE SUPP: 650.0000 mg | Freq: Four times a day (QID) | RECTAL | Status: DC | PRN

## 2015-10-09 MED ORDER — GI COCKTAIL ~~LOC~~
30.0000 mL | Freq: Once | ORAL | Status: AC
  Administered 2015-10-09: 30 mL via ORAL
  Filled 2015-10-09: qty 30

## 2015-10-09 MED ORDER — PNEUMOCOCCAL VAC POLYVALENT 25 MCG/0.5ML IJ INJ
0.5000 mL | INJECTION | INTRAMUSCULAR | Status: AC
  Administered 2015-10-12: 0.5 mL via INTRAMUSCULAR
  Filled 2015-10-09: qty 0.5

## 2015-10-09 MED ORDER — ONDANSETRON HCL 4 MG PO TABS: 4.0000 mg | ORAL_TABLET | Freq: Four times a day (QID) | ORAL | Status: DC | PRN

## 2015-10-09 MED ORDER — METOPROLOL TARTRATE 50 MG PO TABS
50.0000 mg | ORAL_TABLET | Freq: Two times a day (BID) | ORAL | Status: DC
  Administered 2015-10-10 – 2015-10-14 (×10): 50 mg via ORAL
  Filled 2015-10-09 (×10): qty 1

## 2015-10-09 MED ORDER — CALCIUM CARBONATE-VITAMIN D 500-200 MG-UNIT PO TABS
1.0000 | ORAL_TABLET | Freq: Every day | ORAL | Status: DC
  Administered 2015-10-10 – 2015-10-14 (×5): 1 via ORAL
  Filled 2015-10-09 (×5): qty 1

## 2015-10-09 MED ORDER — PROMETHAZINE HCL 25 MG PO TABS
12.5000 mg | ORAL_TABLET | Freq: Four times a day (QID) | ORAL | Status: DC | PRN
  Administered 2015-10-12: 12.5 mg via ORAL
  Filled 2015-10-09: qty 1

## 2015-10-09 MED ORDER — CALCIUM-VITAMIN D 600-200 MG-UNIT PO CAPS: 1.0000 | ORAL_CAPSULE | Freq: Every day | ORAL | Status: DC

## 2015-10-09 MED ORDER — WARFARIN - PHARMACIST DOSING INPATIENT
Freq: Every day | Status: DC
  Administered 2015-10-10 – 2015-10-13 (×4)

## 2015-10-09 NOTE — ED Provider Notes (Signed)
CSN: [id number]     Arrival date & time 10/09/15  1237 History   First MD Initiated Contact with Patient 10/09/15 1729     Chief Complaint  Patient presents with  . Abdominal Pain     (Consider location/radiation/quality/duration/timing/severity/associated sxs/prior Treatment) HPI Patient presents with epigastric pain radiating into the chest, nausea and vomiting for several weeks. She also complains of tremulousness. Initially had diarrhea but that is now improved. She's been evaluated in the emergency department several times the last month. She denies any blood in the vomit or stool. She does have some lightheadedness with standing. Patient has been out of several of her medications including her clonazepam the last month. She took a dose today with improvement of her tremor and nausea. She also has been under increased stress. She has a history of gastritis/GERD but states she takes no PPIs. No recent use of NSAIDs.   Past Medical History  Diagnosis Date  . Dizziness     chronic and of an unclear etiology  . Hypothyroidism   . HTN (hypertension)   . Hyperlipemia   . Obesity   . Osteoporosis   . Gastritis   . Vitamin D deficiency   . Atrial tachycardia ([place])     ablated 11/17/10  by JA  from the [facility] of the aorta  . Atrial flutter ([place])     typical appearing  . Atrial fibrillation ([place])   . GERD (gastroesophageal reflux disease)    Past Surgical History  Procedure Laterality Date  . Atrial ablation surgery  11/17/10    Atrial tachycardia arising from [facility] of the aorta ablated by JA   Family History  Problem Relation Age of Onset  . Heart attack Father   . Hypertension Father    Social History  Substance Use Topics  . Smoking status: Never Smoker   . Smokeless tobacco: Never Used  . Alcohol Use: No   OB History    No data available     Review of Systems  Constitutional: Positive for activity change, appetite change and fatigue. Negative for fever and chills.   Respiratory: Negative for shortness of breath.   Cardiovascular: Negative for chest pain, palpitations and leg swelling.  Gastrointestinal: Positive for nausea, vomiting, abdominal pain and diarrhea. Negative for constipation and blood in stool.  Genitourinary: Negative for dysuria, frequency, hematuria and flank pain.  Musculoskeletal: Negative for myalgias, back pain, neck pain and neck stiffness.  Skin: Negative for rash and wound.  Neurological: Positive for dizziness, tremors, weakness (generalized) and light-headedness. Negative for syncope, numbness and headaches.  All other systems reviewed and are negative.     Allergies  Iodinated diagnostic agents  Home Medications   Prior to Admission medications   Medication Sig Start Date End Date Taking? Authorizing Provider  acetaminophen (TYLENOL) 325 MG tablet Take 325 mg by mouth every 4 (four) hours as needed for headache (pain). Reported on 10/04/2015   Yes Historical Provider, MD  Calcium Carbonate-Vitamin D (CALCIUM-VITAMIN D) 600-200 MG-UNIT CAPS Take 1 capsule by mouth daily. Reported on 10/04/2015   Yes Historical Provider, MD  Cholecalciferol (VITAMIN D3) 1000 UNITS CAPS Take 1,000 Units by mouth daily. Reported on 10/04/2015   Yes Historical Provider, MD  clonazePAM (KLONOPIN) 0.5 MG tablet Take 0.25-0.5 mg by mouth 2 (two) times daily. For tremors   Yes Historical Provider, MD  levothyroxine (SYNTHROID, LEVOTHROID) 88 MCG tablet Take 88 mcg by mouth daily before breakfast.    Yes Historical Provider, MD  metoprolol (  LOPRESSOR) 50 MG tablet Take 1 tablet (50 mg total) by mouth 2 (two) times daily. 10/04/15  Yes Forde Dandy, MD  Multiple Vitamin (MULTIVITAMIN WITH MINERALS) TABS tablet Take 1 tablet by mouth daily.   Yes Historical Provider, MD  ondansetron (ZOFRAN) 4 MG tablet Take 1 tablet (4 mg total) by mouth every 6 (six) hours. Patient taking differently: Take 4 mg by mouth every 6 (six) hours as needed for nausea or  vomiting.  10/04/15  Yes Harvel Quale, MD  OVER THE COUNTER MEDICATION Place 1 drop into both eyes daily as needed (dry eyes). Over the counter lubricating eye drop   Yes Historical Provider, MD  warfarin (COUMADIN) 5 MG tablet Take 1 tablet (5 mg total) by mouth daily. Patient taking differently: Take 2.5-5 mg by mouth daily after supper. Take 1/2 tablet (2.5 mg) by mouth on Thursdays, take 1 tablet (5 mg) on all other days of the week 07/03/15  Yes Jettie Booze, MD  amoxicillin (AMOXIL) 250 MG/5ML suspension Take 10 mLs (500 mg total) by mouth every 8 (eight) hours. 10/13/15   Reyne Dumas, MD  furosemide (LASIX) 20 MG tablet Take 1 tablet (20 mg total) by mouth as directed. TAKE 20 MG 2 DAYS A WEEK Patient not taking: Reported on 10/09/2015 09/24/14   Jettie Booze, MD  metoprolol tartrate (LOPRESSOR) 50 MG tablet Take 1 tablet (50 mg total) by mouth 2 (two) times daily. Patient not taking: Reported on 10/09/2015 09/24/14   Jettie Booze, MD  oxyCODONE-acetaminophen (PERCOCET/ROXICET) 5-325 MG tablet Take 2 tablets by mouth every 4 (four) hours as needed for severe pain. Patient not taking: Reported on 10/09/2015 07/09/15   Samantha Tripp Dowless, PA-C  pantoprazole (PROTONIX) 40 MG tablet Take 1 tablet (40 mg total) by mouth daily. 10/13/15   Reyne Dumas, MD  promethazine (PHENERGAN) 25 MG tablet Take 0.5 tablets (12.5 mg total) by mouth every 6 (six) hours as needed for nausea or vomiting. 10/13/15   Reyne Dumas, MD   BP 123/68 mmHg  Pulse 83  Temp(Src) 97.5 F (36.4 C) (Oral)  Resp 19  Ht 5\' 8"  (1.727 m)  Wt 218 lb 1.6 oz (98.93 kg)  BMI 33.17 kg/m2  SpO2 96% Physical Exam  Constitutional: She is oriented to person, place, and time. She appears well-developed and well-nourished. No distress.  Anxious appearing  HENT:  Head: Normocephalic and atraumatic.  Mouth/Throat: Oropharynx is clear and moist. No oropharyngeal exudate.  Eyes: EOM are normal. Pupils are equal, round,  and reactive to light.  Neck: Normal range of motion. Neck supple. No JVD present.  Cardiovascular: Normal rate and regular rhythm.  Exam reveals no gallop and no friction rub.   No murmur heard. Pulmonary/Chest: Effort normal and breath sounds normal. No respiratory distress. She has no wheezes. She has no rales. She exhibits no tenderness.  Abdominal: Soft. Bowel sounds are normal. She exhibits no distension and no mass. There is tenderness (epigastric tenderness with palpation). There is no rebound and no guarding.  Musculoskeletal: Normal range of motion. She exhibits no edema or tenderness.  No lower extremity asymmetry, tenderness or swelling. Distal pulses are equal and intact.  Neurological: She is alert and oriented to person, place, and time.  Fine tremor noted. 5/5 motor in all extremities. Sensation is fully intact. Bilateral finger to nose intact.  Skin: Skin is warm and dry. No rash noted. No erythema.  Psychiatric: Her behavior is normal.  Nursing note and vitals reviewed.  ED Course  Procedures (including critical care time) Labs Review Labs Reviewed  URINE CULTURE - Abnormal; Notable for the following:    Culture >=100,000 COLONIES/mL ENTEROCOCCUS SPECIES (*)    Organism ID, Bacteria ENTEROCOCCUS SPECIES (*)    All other components within normal limits  BASIC METABOLIC PANEL - Abnormal; Notable for the following:    Glucose, Bld 131 (*)    Creatinine, Ser 1.01 (*)    GFR calc non Af Amer 51 (*)    GFR calc Af Amer 59 (*)    All other components within normal limits  CBC - Abnormal; Notable for the following:    Hemoglobin 15.1 (*)    All other components within normal limits  PROTIME-INR - Abnormal; Notable for the following:    Prothrombin Time 28.3 (*)    INR 2.70 (*)    All other components within normal limits  URINALYSIS, ROUTINE W REFLEX MICROSCOPIC (NOT AT [facility]) - Abnormal; Notable for the following:    APPearance CLOUDY (*)    Hgb urine dipstick TRACE  (*)    Leukocytes, UA MODERATE (*)    All other components within normal limits  T4, FREE - Abnormal; Notable for the following:    Free T4 1.39 (*)    All other components within normal limits  URINE MICROSCOPIC-ADD ON - Abnormal; Notable for the following:    Squamous Epithelial / LPF 0-5 (*)    Bacteria, UA MANY (*)    All other components within normal limits  BASIC METABOLIC PANEL - Abnormal; Notable for the following:    Chloride 112 (*)    Calcium 8.6 (*)    GFR calc non Af Amer 54 (*)    All other components within normal limits  HEPATIC FUNCTION PANEL - Abnormal; Notable for the following:    Total Protein 5.4 (*)    Albumin 2.9 (*)    All other components within normal limits  PROTIME-INR - Abnormal; Notable for the following:    Prothrombin Time 31.1 (*)    INR 3.06 (*)    All other components within normal limits  PROTIME-INR - Abnormal; Notable for the following:    Prothrombin Time 31.3 (*)    INR 3.09 (*)    All other components within normal limits  CBC - Abnormal; Notable for the following:    RBC 3.37 (*)    Hemoglobin 11.0 (*)    HCT 33.4 (*)    All other components within normal limits  BASIC METABOLIC PANEL - Abnormal; Notable for the following:    Chloride 113 (*)    Calcium 8.7 (*)    All other components within normal limits  PROTIME-INR - Abnormal; Notable for the following:    Prothrombin Time 31.6 (*)    INR 3.13 (*)    All other components within normal limits  COMPREHENSIVE METABOLIC PANEL - Abnormal; Notable for the following:    Total Protein 6.1 (*)    Albumin 3.1 (*)    ALT 13 (*)    GFR calc non Af Amer 57 (*)    All other components within normal limits  PROTIME-INR - Abnormal; Notable for the following:    Prothrombin Time 30.0 (*)    INR 2.92 (*)    All other components within normal limits  CBC - Abnormal; Notable for the following:    Hemoglobin 15.6 (*)    All other components within normal limits  COMPREHENSIVE METABOLIC  PANEL - Abnormal; Notable for the following:  Total Protein 6.1 (*)    Albumin 3.2 (*)    ALT 13 (*)    GFR calc non Af Amer 57 (*)    All other components within normal limits  MRSA PCR SCREENING  LIPASE, BLOOD  HEPATIC FUNCTION PANEL  TSH  TROPONIN I  TROPONIN I  TROPONIN I  CBC  CBC  PROTIME-INR  CBC  COMPREHENSIVE METABOLIC PANEL  I-STAT TROPOININ, ED    Imaging Review Nm Hepato W/eject Fract  10/12/2015  CLINICAL DATA:  83 year old female with history of abdominal pain and nausea for the past several weeks. EXAM: NUCLEAR MEDICINE HEPATOBILIARY IMAGING WITH GALLBLADDER EF TECHNIQUE: Sequential images of the abdomen were obtained out to 60 minutes following intravenous administration of radiopharmaceutical. After oral ingestion of Ensure, gallbladder ejection fraction was determined. At 60 min, normal ejection fraction is greater than 33%. RADIOPHARMACEUTICALS:  5.1 mCi Tc-5m  Choletec IV COMPARISON:  No priors. FINDINGS: Prompt uptake and biliary excretion of activity by the liver is seen. Gallbladder activity is visualized, consistent with patency of cystic duct. Biliary activity passes into small bowel, consistent with patent common bile duct. Calculated gallbladder ejection fraction is 94%. (Normal gallbladder ejection fraction with Ensure is greater than 33%.) IMPRESSION: 1. Normal study.  No evidence of acute cholecystitis. Electronically Signed   By: Vinnie Langton M.D.   On: 10/12/2015 14:12   Dg Ugi W/high Density W/kub  10/13/2015  CLINICAL DATA:  Nausea.  Hiatal hernia. EXAM: UPPER GI SERIES WITH KUB TECHNIQUE: After obtaining a scout radiograph a routine upper GI series was performed using thin barium FLUOROSCOPY TIME:  Radiation Exposure Index (as provided by the fluoroscopic device): If the device does not provide the exposure index: Fluoroscopy Time (in minutes and seconds):  1 minutes 54 seconds Number of Acquired Images:  1 COMPARISON:  CT 10/10/2015 FINDINGS: Scout  film of the abdomen shows a nonobstructive bowel gas pattern. No free air. Fluoroscopic evaluation of swallowing demonstrates disruption of primary esophageal peristaltic waves. No fixed esophageal stricture or fold thickening. There is a large paraesophageal type hiatal hernia with much of the stomach in the right lower chest. The distal stomach is within the right upper quadrant the abdomen. No ulceration or mass. Duodenal bulb and proximal duodenum unremarkable. Gastroesophageal reflux noted into the upper thoracic esophagus. IMPRESSION: Large paraesophageal hiatal hernia with much of the stomach in the right lower chest. Spontaneous gastroesophageal reflux into the upper thoracic esophagus. Electronically Signed   By: Rolm Baptise M.D.   On: 10/13/2015 09:33   I have personally reviewed and evaluated these images and lab results as part of my medical decision-making.   EKG Interpretation   Date/Time:  Thursday October 09 2015 20:03:33 EDT Ventricular Rate:  111 PR Interval:    QRS Duration: 87 QT Interval:  352 QTC Calculation: 478 R Axis:   50 Text Interpretation:  Atrial fibrillation Borderline repolarization  abnormality No significant change since last tracing Confirmed by YAO  MD,  Jalaysia Lobb ([id number]) on 10/11/2015 1:40:22 PM      MDM   Final diagnoses:  UTI (lower urinary tract infection)  Gastritis  Atrial fibrillation with RVR ([place])  Dehydration    Patient likely has several factors affecting her condition. Possibly withdrawing from benzodiazepines as well as worsening of her gastritis/reflux. Also appears to have had a UTI that is untreated. Continues to have cloudy urine. Given IV fluids, Ativan, Protonix, GI cocktail and we will reassess.  Patient with persistent vomiting. Discussed with hospitalist and  we'll admit.   Julianne Rice, MD 10/13/15 [phone]

## 2015-10-09 NOTE — ED Notes (Signed)
Pt reports abdominal pain and constant nausea for several weeks. Pt states that she has been out of her medications as well. Pt states that she has been her multiple times for the same things.

## 2015-10-09 NOTE — Consult Note (Signed)
ANTICOAGULATION CONSULT NOTE - Initial Consult  Pharmacy Consult for Coumadin Indication: atrial fibrillation  Allergies  Allergen Reactions  . Iodinated Diagnostic Agents Shortness Of Breath    headache    Vital Signs: Temp: 98 F (36.7 C) (04/27 1444) Temp Source: Oral (04/27 1444) BP: 153/83 mmHg (04/27 2115) Pulse Rate: 107 (04/27 2115)  Labs:  Recent Labs  10/07/15 0904 10/07/15 1026 10/07/15 1325 10/09/15 1305 10/09/15 1807  HGB 14.5  --   --  15.1*  --   HCT 42.0  --   --  43.6  --   PLT 176  --   --  194  --   LABPROT  --  32.6*  --   --  28.3*  INR  --  3.26*  --   --  2.70*  CREATININE 1.01*  --   --  1.01*  --   TROPONINI <0.03  --  0.03  --   --     Estimated Creatinine Clearance: 56.2 mL/min (by C-G formula based on Cr of 1.01).   Medical History: Past Medical History  Diagnosis Date  . Dizziness     chronic and of an unclear etiology  . Hypothyroidism   . HTN (hypertension)   . Hyperlipemia   . Obesity   . Osteoporosis   . Gastritis   . Vitamin D deficiency   . Atrial tachycardia ([place])     ablated 11/17/10  by JA  from the [facility] of the aorta  . Atrial flutter ([place])     typical appearing  . Atrial fibrillation ([place])   . GERD (gastroesophageal reflux disease)    Assessment: 82yof on coumadin pta for afib, being admitted with abdominal pain. Coumadin to continue. INR on admit is therapeutic at 2.7.  Home dose: 5mg  daily except 2.5mg  on Thursday - last taken 4/26  Goal of Therapy:  INR 2-3 Monitor platelets by anticoagulation protocol: Yes   Plan:  1) Coumadin 2.5mg  x 1 2) Daily INR  Deboraha Sprang 10/09/2015,9:57 PM

## 2015-10-09 NOTE — H&P (Signed)
[facility] History and Physical  BRALEY DIMURO [id number] DOB: 1932-08-13 DOA: 10/09/2015  Referring physician: Dr.Yelverton. PCP: Antony Blackbird, MD  Specialists: Dr.Varnasi. Cardiologist.  Chief Complaint: Nausea vomiting.  HPI: Charlotte Castro is a 82 y.o. female history of chronic atrial fibrillation, hypothyroidism, hypertension presents to the ER because of persistent nausea with vomiting. Patient states she has been having these symptoms over the last 1 month and has epigastric discomfort along with it. Patient's symptoms are mostly worse in the morning. Patient states having oat meal helps her. During her recent urgent care visit patient did state that area but really denies any diarrhea. In the ER patient also was found to be tachycardic with A. fib with RVR. Patient not sure if she had taken her metoprolol. Patient will be admitted for further management of her nausea vomiting and A. fib with RVR. Patient also had some brief episode chest pain 2-3 days ago but denies any chest pain at this time. Denies any dysphagia or weight loss.   Review of Systems: As presented in the history of presenting illness, rest negative.  Past Medical History  Diagnosis Date  . Dizziness     chronic and of an unclear etiology  . Hypothyroidism   . HTN (hypertension)   . Hyperlipemia   . Obesity   . Osteoporosis   . Gastritis   . Vitamin D deficiency   . Atrial tachycardia ([place])     ablated 11/17/10  by JA  from the [facility] of the aorta  . Atrial flutter ([place])     typical appearing  . Atrial fibrillation ([place])   . GERD (gastroesophageal reflux disease)    Past Surgical History  Procedure Laterality Date  . Atrial ablation surgery  11/17/10    Atrial tachycardia arising from [facility] of the aorta ablated by JA   Social History:  reports that she has never smoked. She has never used smokeless tobacco. She reports that she does not drink alcohol or use illicit drugs. Where does patient live At  home. Can patient participate in ADLs? Yes.  Allergies  Allergen Reactions  . Iodinated Diagnostic Agents Shortness Of Breath    headache    Family History:  Family History  Problem Relation Age of Onset  . Heart attack Father   . Hypertension Father       Prior to Admission medications   Medication Sig Start Date End Date Taking? Authorizing Provider  acetaminophen (TYLENOL) 325 MG tablet Take 325 mg by mouth every 4 (four) hours as needed for headache (pain). Reported on 10/04/2015   Yes Historical Provider, MD  Calcium Carbonate-Vitamin D (CALCIUM-VITAMIN D) 600-200 MG-UNIT CAPS Take 1 capsule by mouth daily. Reported on 10/04/2015   Yes Historical Provider, MD  Cholecalciferol (VITAMIN D3) 1000 UNITS CAPS Take 1,000 Units by mouth daily. Reported on 10/04/2015   Yes Historical Provider, MD  clonazePAM (KLONOPIN) 0.5 MG tablet Take 0.25-0.5 mg by mouth 2 (two) times daily. For tremors   Yes Historical Provider, MD  levothyroxine (SYNTHROID, LEVOTHROID) 88 MCG tablet Take 88 mcg by mouth daily before breakfast.    Yes Historical Provider, MD  metoprolol (LOPRESSOR) 50 MG tablet Take 1 tablet (50 mg total) by mouth 2 (two) times daily. 10/04/15  Yes Forde Dandy, MD  Multiple Vitamin (MULTIVITAMIN WITH MINERALS) TABS tablet Take 1 tablet by mouth daily.   Yes Historical Provider, MD  ondansetron (ZOFRAN) 4 MG tablet Take 1 tablet (4 mg total) by mouth every 6 (  six) hours. Patient taking differently: Take 4 mg by mouth every 6 (six) hours as needed for nausea or vomiting.  10/04/15  Yes Harvel Quale, MD  OVER THE COUNTER MEDICATION Place 1 drop into both eyes daily as needed (dry eyes). Over the counter lubricating eye drop   Yes Historical Provider, MD  promethazine (PHENERGAN) 25 MG tablet Take 0.5 tablets (12.5 mg total) by mouth every 6 (six) hours as needed for nausea or vomiting. 10/07/15  Yes Virgel Manifold, MD  warfarin (COUMADIN) 5 MG tablet Take 1 tablet (5 mg total) by mouth  daily. Patient taking differently: Take 2.5-5 mg by mouth daily after supper. Take 1/2 tablet (2.5 mg) by mouth on Thursdays, take 1 tablet (5 mg) on all other days of the week 07/03/15  Yes Jettie Booze, MD  furosemide (LASIX) 20 MG tablet Take 1 tablet (20 mg total) by mouth as directed. TAKE 20 MG 2 DAYS A WEEK Patient not taking: Reported on 10/09/2015 09/24/14   Jettie Booze, MD  metoprolol tartrate (LOPRESSOR) 50 MG tablet Take 1 tablet (50 mg total) by mouth 2 (two) times daily. Patient not taking: Reported on 10/09/2015 09/24/14   Jettie Booze, MD  oxyCODONE-acetaminophen (PERCOCET/ROXICET) 5-325 MG tablet Take 2 tablets by mouth every 4 (four) hours as needed for severe pain. Patient not taking: Reported on 10/09/2015 07/09/15   Carlos Levering, PA-C    Physical Exam: Filed Vitals:   10/09/15 2030 10/09/15 2045 10/09/15 2100 10/09/15 2115  BP: 150/81 144/72 145/72 153/83  Pulse: 105 108 105 107  Temp:      TempSrc:      Resp: 24 15 16 20   SpO2: 98% 97% 99% 100%     General:  Moderately built and nourished.  Eyes: Anicteric no pallor.  ENT: No discharge from the ears eyes nose or mouth.  Neck: No mass felt.  Cardiovascular: S1 and S2 heard.  Respiratory: No rhonchi or crepitations.  Abdomen: Mild epigastric tenderness no guarding or rigidity.  Skin: No rash.  Musculoskeletal: No edema.  Psychiatric: Appears normal.  Neurologic: Alert awake oriented to time place and person. Moves all extremities.  Labs on Admission:  Basic Metabolic Panel:  Recent Labs Lab 10/04/15 1210 10/04/15 1213 10/07/15 0904 10/09/15 1305  NA 143  --  141 143  K 4.2  --  4.2 4.4  CL 109  --  109 109  CO2 19*  --  24 23  GLUCOSE 91  --  98 131*  BUN 10  --  10 11  CREATININE 1.10*  --  1.01* 1.01*  CALCIUM 9.9  --  10.0 9.9  MG  --  1.7  --   --    Liver Function Tests:  Recent Labs Lab 10/09/15 1807  AST 22  ALT 18  ALKPHOS 71  BILITOT 1.1   PROT 7.0  ALBUMIN 3.7    Recent Labs Lab 10/09/15 1807  LIPASE 23   No results for input(s): AMMONIA in the last 168 hours. CBC:  Recent Labs Lab 10/04/15 1210 10/04/15 1213 10/07/15 0904 10/09/15 1305  WBC 8.5  --  9.6 8.8  NEUTROABS  --  4.8 7.3  --   HGB 15.0  --  14.5 15.1*  HCT 42.5  --  42.0 43.6  MCV 92.8  --  93.5 93.6  PLT 183  --  176 194   Cardiac Enzymes:  Recent Labs Lab 10/07/15 0904 10/07/15 1325  TROPONINI <0.03 0.03  BNP (last 3 results) No results for input(s): BNP in the last 8760 hours.  ProBNP (last 3 results) No results for input(s): PROBNP in the last 8760 hours.  CBG: No results for input(s): GLUCAP in the last 168 hours.  Radiological Exams on Admission: Dg Chest 2 View  10/09/2015  CLINICAL DATA:  Chest pain EXAM: CHEST  2 VIEW COMPARISON:  10/04/2015 FINDINGS: COPD with hyperinflation of the lungs. Elevated right hemidiaphragm. Very large hiatal hernia with air-fluid level. Lungs are clear without infiltrate or effusion. Negative for heart failure. IMPRESSION: COPD without acute cardiopulmonary abnormality Large hiatal hernia. Electronically Signed   By: Franchot Gallo M.D.   On: 10/09/2015 13:50    EKG: Independently reviewed. Atrial fibrillation with rate of 111 bpm.  Assessment/Plan Principal Problem:   Nausea & vomiting Active Problems:   Essential hypertension, benign   Atrial fibrillation with RVR (HCC)   1. Nausea vomiting with epigastric discomfort - Differentials include peptic ulcer disease versus gallbladder pathology. I have ordered sonogram of abdomen to check for gallstones. For now Patient on full liquid diet. Further recommendation based on sonogram results. We'll also check cardiac markers to rule out ACS. As per cardiology notes in 2012 cardiac cath was unremarkable. 2. A. fib with RVR - not sure patient was compliant with her metoprolol. Patient has been started on Cardizem infusion in the ER by the ER  physician. I have continued patient's metoprolol dose 1 dose now. Slowly taper off Cardizem infusion 1A gets control. Patient previously was not tolerant to amiodarone as per the cardiology notes. Patient's chads 2 vasc score is 4. Patient is on Coumadin per pharmacy. 3. Hypertension - on metoprolol. 4. Chronic kidney disease stage II - creatinine appears to be at baseline. 5. Possible UTI on ceftriaxone and follow urine cultures. 6. Hypothyroidism on Synthroid.   DVT Prophylaxis Coumadin.  Code Status: Full code.  Family Communication: Patient's family at the bedside.  Disposition Plan: Admit for observation.    Zelda Reames N. [facility] Pager [phone].  If 7PM-7AM, please contact night-coverage [email or web address] Password [facility] 10/09/2015, 9:50 PM

## 2015-10-10 ENCOUNTER — Observation Stay (HOSPITAL_COMMUNITY): Payer: Medicare Other

## 2015-10-10 DIAGNOSIS — Z8249 Family history of ischemic heart disease and other diseases of the circulatory system: Secondary | ICD-10-CM | POA: Diagnosis not present

## 2015-10-10 DIAGNOSIS — K219 Gastro-esophageal reflux disease without esophagitis: Secondary | ICD-10-CM | POA: Diagnosis not present

## 2015-10-10 DIAGNOSIS — E039 Hypothyroidism, unspecified: Secondary | ICD-10-CM | POA: Diagnosis not present

## 2015-10-10 DIAGNOSIS — I4891 Unspecified atrial fibrillation: Secondary | ICD-10-CM | POA: Diagnosis not present

## 2015-10-10 DIAGNOSIS — N182 Chronic kidney disease, stage 2 (mild): Secondary | ICD-10-CM | POA: Diagnosis not present

## 2015-10-10 DIAGNOSIS — Z6833 Body mass index (BMI) 33.0-33.9, adult: Secondary | ICD-10-CM | POA: Diagnosis not present

## 2015-10-10 DIAGNOSIS — K279 Peptic ulcer, site unspecified, unspecified as acute or chronic, without hemorrhage or perforation: Secondary | ICD-10-CM | POA: Diagnosis present

## 2015-10-10 DIAGNOSIS — R112 Nausea with vomiting, unspecified: Secondary | ICD-10-CM | POA: Diagnosis present

## 2015-10-10 DIAGNOSIS — G43A1 Cyclical vomiting, intractable: Secondary | ICD-10-CM | POA: Diagnosis not present

## 2015-10-10 DIAGNOSIS — R1110 Vomiting, unspecified: Secondary | ICD-10-CM

## 2015-10-10 DIAGNOSIS — E860 Dehydration: Secondary | ICD-10-CM

## 2015-10-10 DIAGNOSIS — Z7901 Long term (current) use of anticoagulants: Secondary | ICD-10-CM | POA: Diagnosis not present

## 2015-10-10 DIAGNOSIS — M810 Age-related osteoporosis without current pathological fracture: Secondary | ICD-10-CM | POA: Diagnosis not present

## 2015-10-10 DIAGNOSIS — Z91041 Radiographic dye allergy status: Secondary | ICD-10-CM | POA: Diagnosis not present

## 2015-10-10 DIAGNOSIS — N390 Urinary tract infection, site not specified: Secondary | ICD-10-CM | POA: Diagnosis not present

## 2015-10-10 DIAGNOSIS — I482 Chronic atrial fibrillation: Secondary | ICD-10-CM | POA: Diagnosis not present

## 2015-10-10 DIAGNOSIS — B952 Enterococcus as the cause of diseases classified elsewhere: Secondary | ICD-10-CM | POA: Diagnosis not present

## 2015-10-10 DIAGNOSIS — E785 Hyperlipidemia, unspecified: Secondary | ICD-10-CM | POA: Diagnosis not present

## 2015-10-10 DIAGNOSIS — K760 Fatty (change of) liver, not elsewhere classified: Secondary | ICD-10-CM | POA: Diagnosis not present

## 2015-10-10 DIAGNOSIS — Z23 Encounter for immunization: Secondary | ICD-10-CM | POA: Diagnosis not present

## 2015-10-10 DIAGNOSIS — R000 Tachycardia, unspecified: Secondary | ICD-10-CM | POA: Diagnosis not present

## 2015-10-10 DIAGNOSIS — I129 Hypertensive chronic kidney disease with stage 1 through stage 4 chronic kidney disease, or unspecified chronic kidney disease: Secondary | ICD-10-CM | POA: Diagnosis not present

## 2015-10-10 DIAGNOSIS — E559 Vitamin D deficiency, unspecified: Secondary | ICD-10-CM | POA: Diagnosis not present

## 2015-10-10 DIAGNOSIS — E669 Obesity, unspecified: Secondary | ICD-10-CM | POA: Diagnosis not present

## 2015-10-10 DIAGNOSIS — Z79899 Other long term (current) drug therapy: Secondary | ICD-10-CM | POA: Diagnosis not present

## 2015-10-10 DIAGNOSIS — K449 Diaphragmatic hernia without obstruction or gangrene: Secondary | ICD-10-CM | POA: Diagnosis not present

## 2015-10-10 LAB — CBC
HEMATOCRIT: 36.6 % (ref 36.0–46.0)
Hemoglobin: 12.6 g/dL (ref 12.0–15.0)
MCH: 32.5 pg (ref 26.0–34.0)
MCHC: 34.4 g/dL (ref 30.0–36.0)
MCV: 94.3 fL (ref 78.0–100.0)
Platelets: 159 10*3/uL (ref 150–400)
RBC: 3.88 MIL/uL (ref 3.87–5.11)
RDW: 14 % (ref 11.5–15.5)
WBC: 7 10*3/uL (ref 4.0–10.5)

## 2015-10-10 LAB — BASIC METABOLIC PANEL
ANION GAP: 8 (ref 5–15)
BUN: 11 mg/dL (ref 6–20)
CHLORIDE: 112 mmol/L — AB (ref 101–111)
CO2: 24 mmol/L (ref 22–32)
Calcium: 8.6 mg/dL — ABNORMAL LOW (ref 8.9–10.3)
Creatinine, Ser: 0.96 mg/dL (ref 0.44–1.00)
GFR calc non Af Amer: 54 mL/min — ABNORMAL LOW (ref >60)
GLUCOSE: 91 mg/dL (ref 65–99)
Potassium: 3.7 mmol/L (ref 3.5–5.1)
SODIUM: 144 mmol/L (ref 135–145)

## 2015-10-10 LAB — TROPONIN I
TROPONIN I: 0.03 ng/mL (ref <0.031)
Troponin I: <0.03 ng/mL (ref <0.031)

## 2015-10-10 LAB — PROTIME-INR
INR: 3.06 — ABNORMAL HIGH (ref 0.00–1.49)
Prothrombin Time: 31.1 s — ABNORMAL HIGH (ref 11.6–15.2)

## 2015-10-10 LAB — MRSA PCR SCREENING: MRSA by PCR: NEGATIVE

## 2015-10-10 LAB — HEPATIC FUNCTION PANEL
ALT: 15 U/L (ref 14–54)
AST: 17 U/L (ref 15–41)
Albumin: 2.9 g/dL — ABNORMAL LOW (ref 3.5–5.0)
Alkaline Phosphatase: 60 U/L (ref 38–126)
BILIRUBIN TOTAL: 1 mg/dL (ref 0.3–1.2)
Bilirubin, Direct: 0.2 mg/dL (ref 0.1–0.5)
Indirect Bilirubin: 0.8 mg/dL (ref 0.3–0.9)
Total Protein: 5.4 g/dL — ABNORMAL LOW (ref 6.5–8.1)

## 2015-10-10 IMAGING — CT CT ABD-PELV W/O CM
2 of 4 series · 17 of 46 positions shown, 19 images · IV contrast (Omni 300)
Comparison: Ultrasound [DATE]

CLINICAL DATA: Intractable nausea and vomiting. Epigastric
tenderness.

EXAM:
CT ABDOMEN AND PELVIS WITHOUT CONTRAST
TECHNIQUE: Multidetector CT imaging of the abdomen and pelvis was performed
following the standard protocol without IV contrast.

[Series 2: a/p w/ 5mm · axial · 0.96mm/px · z∈[-632,-192]mm · 14 of 98 slices shown, 16 images]
[im 5/98  soft-tissue]
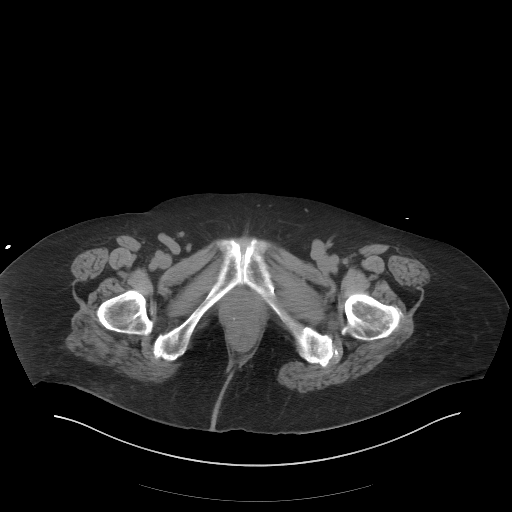
[im 5/98  bone]
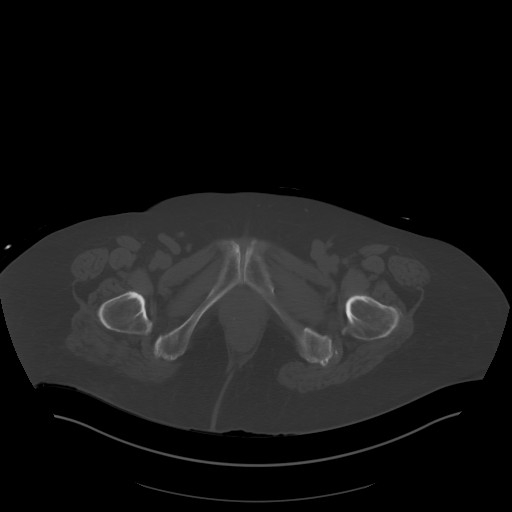
[im 13/98  soft-tissue]
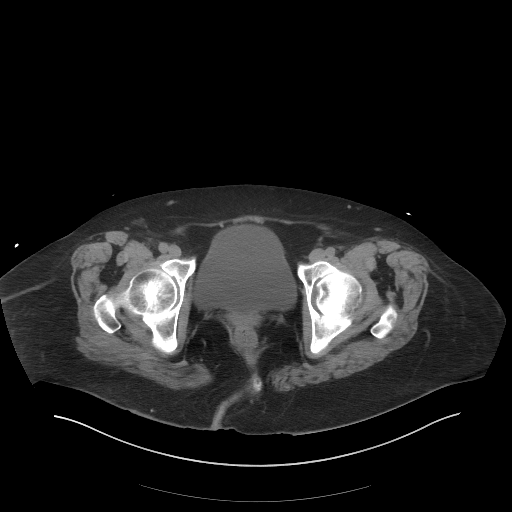
[im 21/98  soft-tissue]
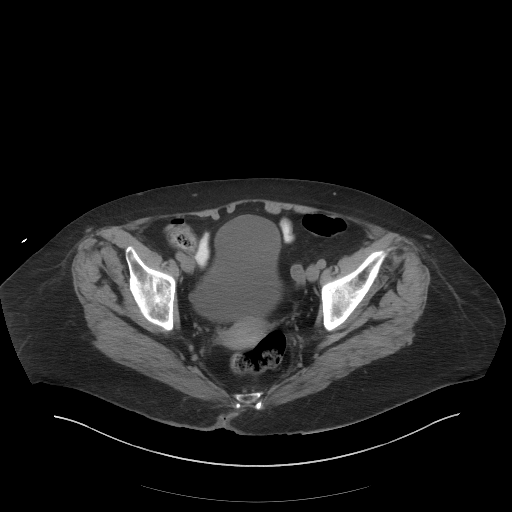
[im 25/98  soft-tissue]
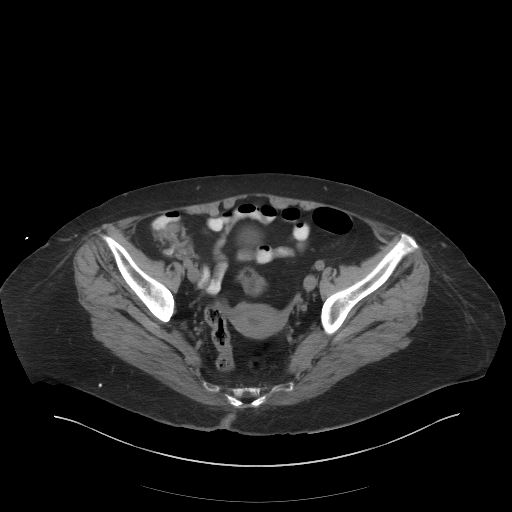
[im 33/98  soft-tissue]
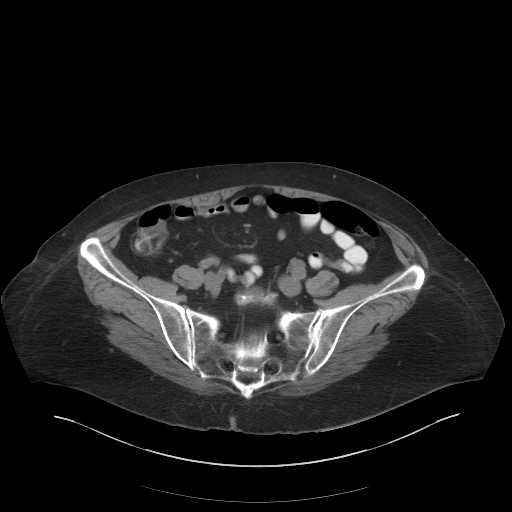
[im 41/98  soft-tissue]
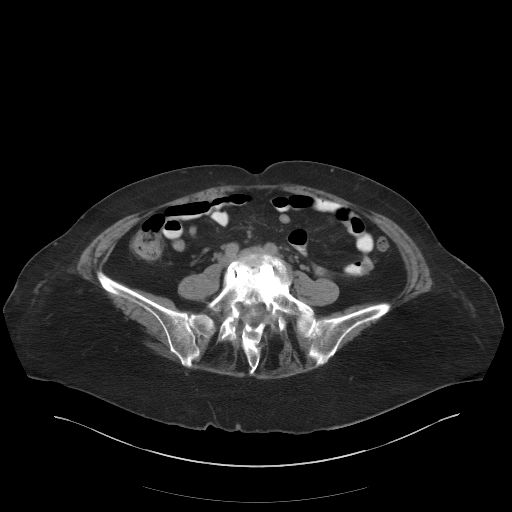
[im 45/98  soft-tissue]
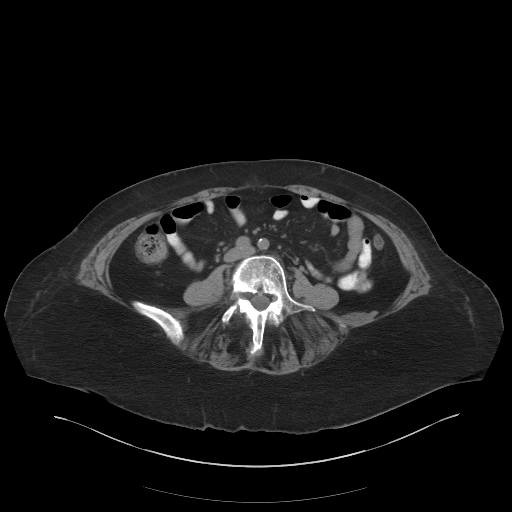
[im 53/98  soft-tissue]
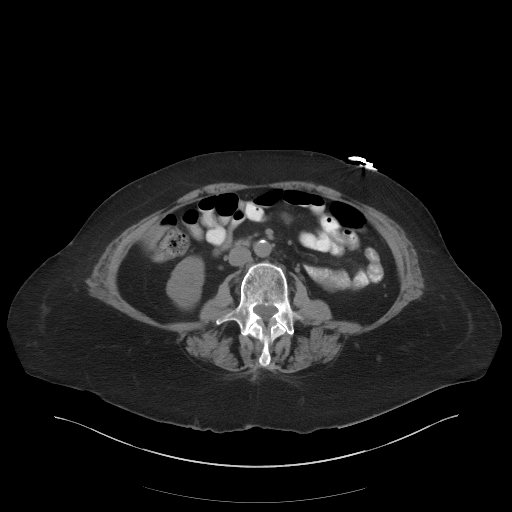
[im 57/98  soft-tissue]
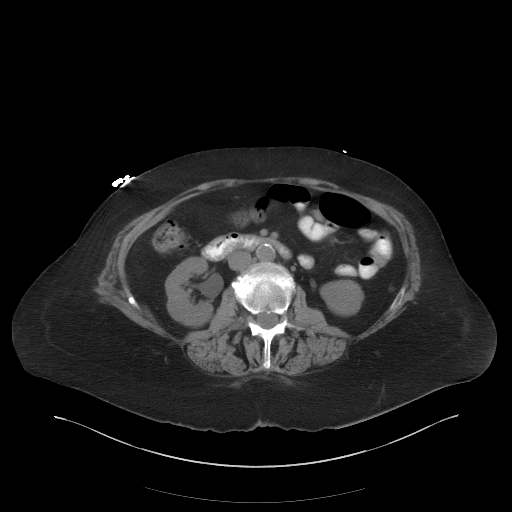
[im 57/98  bone]
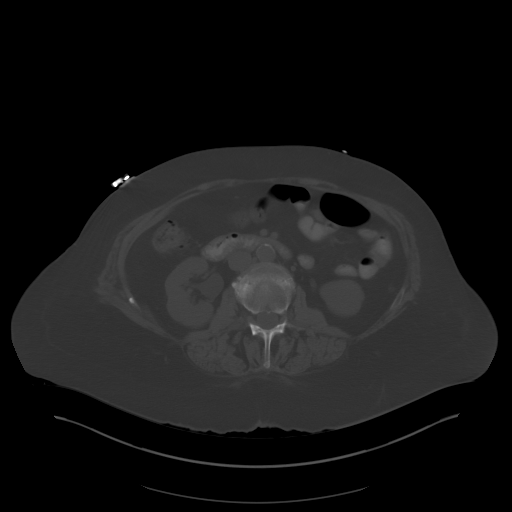
[im 65/98  soft-tissue]
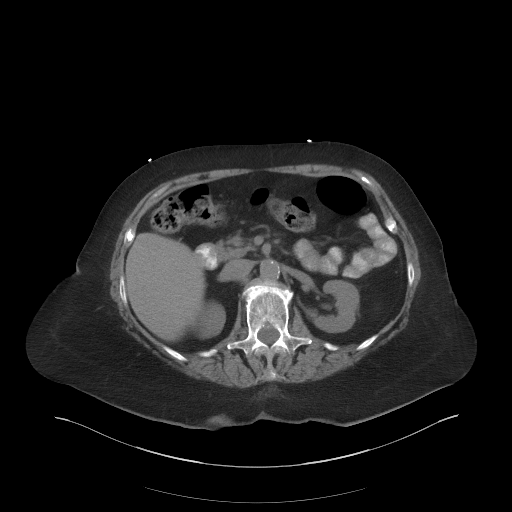
[im 73/98  soft-tissue]
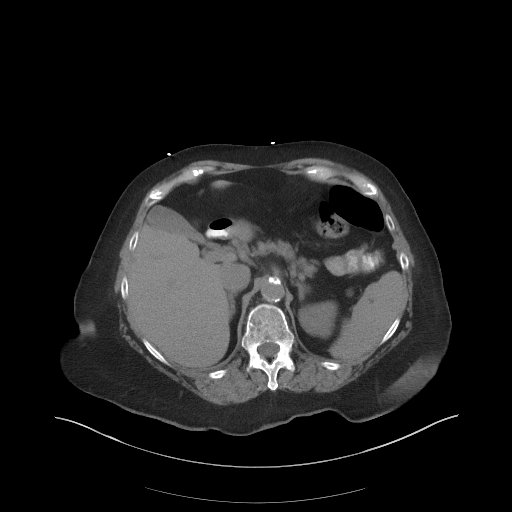
[im 77/98  soft-tissue]
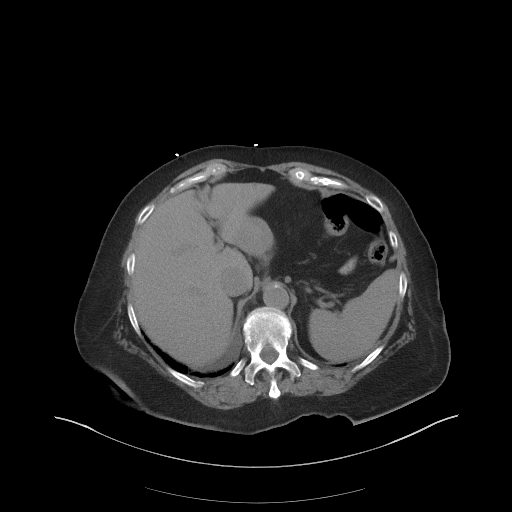
[im 85/98  soft-tissue]
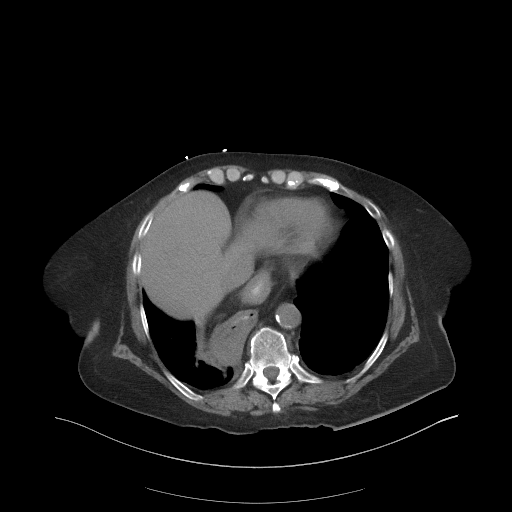
[im 93/98  soft-tissue]
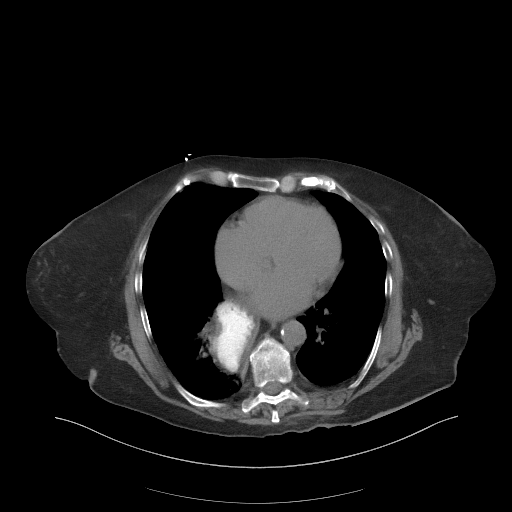

[Series 5: a/p w/ cor · coronal · 0.91mm/px · 3 of 151 slices shown]
[im 51/151  soft-tissue]
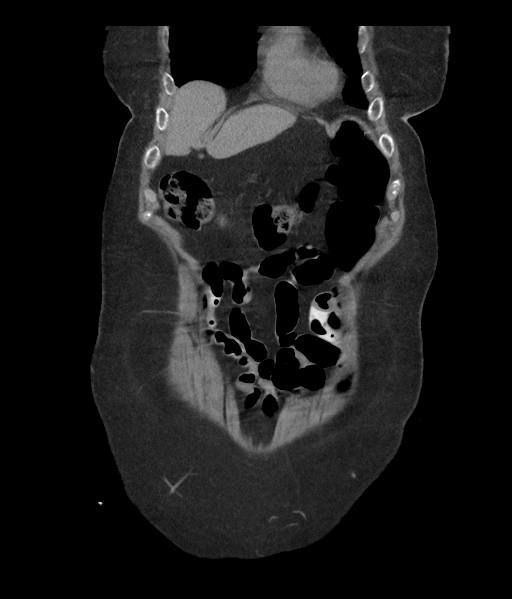
[im 67/151  soft-tissue]
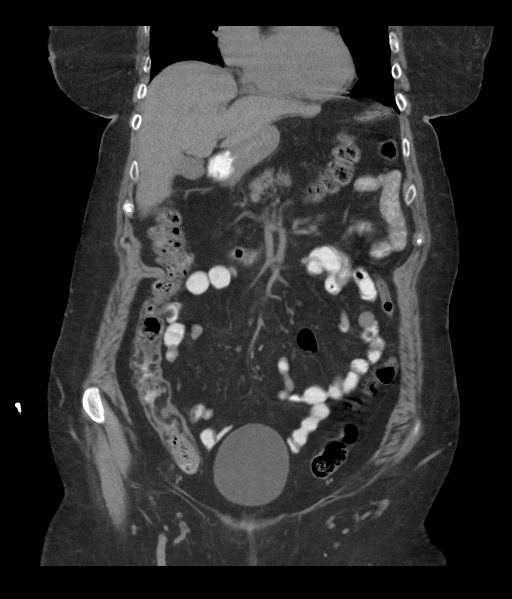
[im 84/151  soft-tissue]
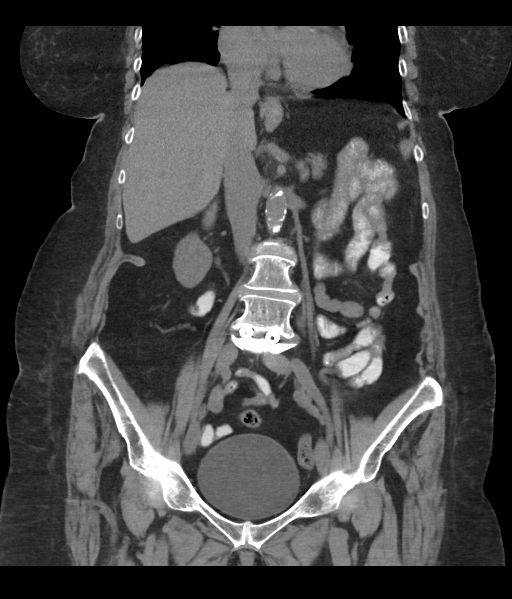

[17 of 46 positions shown; findings below may reference images not displayed]

FINDINGS: Lower chest: Large hiatal hernia in the right lower chest. Heart is
normal size. Right basilar atelectasis. No confluent opacity on the
left. No effusions.

Hepatobiliary: No focal hepatic abnormality. Gallbladder
unremarkable.

Pancreas: No focal abnormality or ductal dilatation.

Spleen: No focal abnormality.  Normal size.

Adrenals/Urinary Tract: No adrenal abnormality. No focal renal
abnormality. No stones or hydronephrosis. Urinary bladder is
unremarkable.

Stomach/Bowel: Stomach, large and small bowel grossly unremarkable.

Vascular/Lymphatic: No evidence of aneurysm or adenopathy. Aortic
calcifications.

Reproductive: Uterus and adnexa unremarkable.  No mass.

Other: No free fluid or free air.

Musculoskeletal: No acute bony abnormality or focal bone lesion.
Postoperative changes in the lower lumbar spine.
IMPRESSION: No acute findings in the abdomen or pelvis.

Large hiatal hernia.  Adjacent right base atelectasis.

Aortic atherosclerosis.

## 2015-10-10 MED ORDER — PANTOPRAZOLE SODIUM 40 MG IV SOLR
40.0000 mg | INTRAVENOUS | Status: DC
  Administered 2015-10-10 – 2015-10-13 (×5): 40 mg via INTRAVENOUS
  Filled 2015-10-10 (×5): qty 40

## 2015-10-10 MED ORDER — BARIUM SULFATE 2.1 % PO SUSP
ORAL | Status: AC
  Filled 2015-10-10: qty 2

## 2015-10-10 MED ORDER — BARIUM SULFATE 2.1 % PO SUSP
450.0000 mL | Freq: Once | ORAL | Status: AC
  Administered 2015-10-10: 1 mL via ORAL

## 2015-10-10 MED ORDER — DEXTROSE 5 % IV SOLN
1.0000 g | INTRAVENOUS | Status: DC
  Administered 2015-10-11: 1 g via INTRAVENOUS
  Filled 2015-10-10 (×3): qty 10

## 2015-10-10 MED ORDER — WARFARIN SODIUM 5 MG PO TABS
5.0000 mg | ORAL_TABLET | Freq: Once | ORAL | Status: AC
  Administered 2015-10-10: 5 mg via ORAL
  Filled 2015-10-10: qty 1

## 2015-10-10 NOTE — Progress Notes (Signed)
[facility] PROGRESS NOTE  Charlotte Castro [id number] DOB: Sep 27, 1932 DOA: 10/09/2015   PCP: Antony Blackbird, MD     Assessment/Plan: Principal Problem:   Nausea & vomiting Active Problems:   Essential hypertension, benign   Atrial fibrillation with RVR ([place])   Dehydration    HPI: Charlotte Castro is a 82 y.o. female history of chronic atrial fibrillation, hypothyroidism, hypertension presents to the ER because of persistent nausea with vomiting. Patient states she has been having these symptoms over the last 1 month and has epigastric discomfort along with it. Patient's symptoms are mostly worse in the morning. Patient states having oat meal helps her. During her recent urgent care visit patient did state that area but really denies any diarrhea. In the ER patient also was found to be tachycardic with A. fib with RVR. Patient not sure if she had taken her metoprolol. Patient will be admitted for further management of her nausea vomiting and A. fib with RVR. Patient also had some brief episode chest pain 2-3 days ago but denies any chest pain at this time  Assessment and plan  Nausea vomiting with epigastric discomfort - Differentials include peptic ulcer disease versus gallbladder pathology vs gastroesophageal reflux. Abdominal ultrasound shows fatty liver but otherwise no other pathology. For now Patient on full liquid diet. Obtain CT scan of the abdomen and pelvis of the symptoms have been ongoing for the last 1 month. Cardiac enzymes negative. . As per cardiology notes in 2012 cardiac cath was unremarkable. May need a GI consultation if CT negative for EGD  A. fib with RVR - not sure patient was compliant with her metoprolol. Patient has been started on Cardizem infusion in the ER by the ER physician. Resume home dose of metoprolol. Slowly taper off Cardizem infusion  . Patient previously was not tolerant to amiodarone as per the cardiology notes. Patient's chads 2 vasc score is  4. Patient is on Coumadin per pharmacy.  Hypertension - on metoprolol.  Chronic kidney disease stage II - creatinine appears to be at baseline.  Possible UTI on ceftriaxone and follow urine cultures.  Hypothyroidism on Synthroid.    DVT prophylaxsis Coumadin    Code Status:  Full code     Family Communication: Discussed in detail with the patient, all imaging results, lab results explained to the patient   Disposition Plan: Transfer to telemetry, anticipate discharge in one to 2 days      Consultants:  None  Procedures:  None  Antibiotics: Rocephin 04/28-        HPI/Subjective: Continues to have nausea but denies weight loss  Objective: Filed Vitals:   10/10/15 0100 10/10/15 0345 10/10/15 0400 10/10/15 0741  BP: 128/58  120/61 107/95  Pulse:    73  Temp: 98.4 F (36.9 C) 98 F (36.7 C)  98.5 F (36.9 C)  TempSrc: Axillary Oral  Oral  Resp: 22  19 14   Height:      Weight:      SpO2: 95%  95% 99%    Intake/Output Summary (Last 24 hours) at 10/10/15 0802 Last data filed at 10/10/15 0749  Gross per 24 hour  Intake 343.33 ml  Output     25 ml  Net 318.33 ml    Exam:  Examination:  General exam: Appears calm and comfortable  Respiratory system: Clear to auscultation. Respiratory effort normal. Cardiovascular system: S1 & S2 heard, RRR. No JVD, murmurs, rubs, gallops or clicks. No pedal edema.  Gastrointestinal system: Abdomen is nondistended, soft and nontender. No organomegaly or masses felt. Normal bowel sounds heard. Central nervous system: Alert and oriented. No focal neurological deficits. Extremities: Symmetric 5 x 5 power. Skin: No rashes, lesions or ulcers Psychiatry: Judgement and insight appear normal. Mood & affect appropriate.     Data Reviewed: I have personally reviewed following labs and imaging studies  Micro Results Recent Results (from the past 240 hour(s))  MRSA PCR Screening     Status: None   Collection Time:  10/09/15 10:36 PM  Result Value Ref Range Status   MRSA by PCR NEGATIVE NEGATIVE Final    Comment:        The GeneXpert MRSA Assay (FDA approved for NASAL specimens only), is one component of a comprehensive MRSA colonization surveillance program. It is not intended to diagnose MRSA infection nor to guide or monitor treatment for MRSA infections.     Radiology Reports Dg Chest 2 View  10/09/2015  CLINICAL DATA:  Chest pain EXAM: CHEST  2 VIEW COMPARISON:  10/04/2015 FINDINGS: COPD with hyperinflation of the lungs. Elevated right hemidiaphragm. Very large hiatal hernia with air-fluid level. Lungs are clear without infiltrate or effusion. Negative for heart failure. IMPRESSION: COPD without acute cardiopulmonary abnormality Large hiatal hernia. Electronically Signed   By: Franchot Gallo M.D.   On: 10/09/2015 13:50   Dg Chest 2 View  10/04/2015  CLINICAL DATA:  Weakness for 2 days EXAM: CHEST  2 VIEW COMPARISON:  08/07/2012 FINDINGS: Normal cardiac silhouette. Large hiatal hernia posterior the heart. There is peripheral linear interstitial pattern similar to comparison exam. No focal infiltrate. No pneumothorax. IMPRESSION: 1. Interstitial edema. 2. Large hiatal hernia. Electronically Signed   By: Suzy Bouchard M.D.   On: 10/04/2015 13:48   US Abdomen Complete  10/10/2015  CLINICAL DATA:  Chronic nausea and vomiting.  Initial encounter. EXAM: ABDOMEN ULTRASOUND COMPLETE COMPARISON:  None. FINDINGS: Gallbladder: No gallstones or wall thickening visualized. No sonographic Murphy sign noted by sonographer. Common bile duct: Diameter: 0.3 cm, within normal limits in caliber. Liver: No focal lesion identified. Mildly increased parenchymal echogenicity, likely reflecting fatty infiltration. IVC: No abnormality visualized. Pancreas: Visualized portion unremarkable. Spleen: Size and appearance within normal limits. Right Kidney: Length: 10.3 cm. Echogenicity within normal limits. No mass or  hydronephrosis visualized. Left Kidney: Length: 10.3 cm. Echogenicity within normal limits. No mass or hydronephrosis visualized. Abdominal aorta: No aneurysm visualized. Mild calcification is noted along the abdominal aorta. Other findings: None. IMPRESSION: 1. No acute abnormality seen within the abdomen. 2. Mild calcification along the abdominal aorta. 3. Mildly fatty infiltration within the liver. Electronically Signed   By: Garald Balding M.D.   On: 10/10/2015 00:14     CBC  Recent Labs Lab 10/04/15 1210 10/04/15 1213 10/07/15 0904 10/09/15 1305 10/10/15 0338  WBC 8.5  --  9.6 8.8 7.0  HGB 15.0  --  14.5 15.1* 12.6  HCT 42.5  --  42.0 43.6 36.6  PLT 183  --  176 194 159  MCV 92.8  --  93.5 93.6 94.3  MCH 32.8  --  32.3 32.4 32.5  MCHC 35.3  --  34.5 34.6 34.4  RDW 13.8  --  13.8 13.8 14.0  LYMPHSABS  --  2.7 1.1  --   --   MONOABS  --  0.9 0.9  --   --   EOSABS  --  0.1 0.2  --   --   BASOSABS  --  0.1 0.0  --   --  Chemistries   Recent Labs Lab 10/04/15 1210 10/04/15 1213 10/07/15 0904 10/09/15 1305 10/09/15 1807 10/10/15 0338  NA 143  --  141 143  --  144  K 4.2  --  4.2 4.4  --  3.7  CL 109  --  109 109  --  112*  CO2 19*  --  24 23  --  24  GLUCOSE 91  --  98 131*  --  91  BUN 10  --  10 11  --  11  CREATININE 1.10*  --  1.01* 1.01*  --  0.96  CALCIUM 9.9  --  10.0 9.9  --  8.6*  MG  --  1.7  --   --   --   --   AST  --   --   --   --  22 17  ALT  --   --   --   --  18 15  ALKPHOS  --   --   --   --  71 60  BILITOT  --   --   --   --  1.1 1.0   ------------------------------------------------------------------------------------------------------------------ estimated creatinine clearance is 57.9 mL/min (by C-G formula based on Cr of 0.96). ------------------------------------------------------------------------------------------------------------------ No results for input(s): HGBA1C in the last 72  hours. ------------------------------------------------------------------------------------------------------------------ No results for input(s): CHOL, HDL, LDLCALC, TRIG, CHOLHDL, LDLDIRECT in the last 72 hours. ------------------------------------------------------------------------------------------------------------------  Recent Labs  10/09/15 1807  TSH 3.141   ------------------------------------------------------------------------------------------------------------------ No results for input(s): VITAMINB12, FOLATE, FERRITIN, TIBC, IRON, RETICCTPCT in the last 72 hours.  Coagulation profile  Recent Labs Lab 10/04/15 1213 10/07/15 1026 10/09/15 1807 10/10/15 0338  INR 2.54* 3.26* 2.70* 3.06*    No results for input(s): DDIMER in the last 72 hours.  Cardiac Enzymes  Recent Labs Lab 10/07/15 1325 10/09/15 2222 10/10/15 0338  TROPONINI 0.03 <0.03 0.03   ------------------------------------------------------------------------------------------------------------------ Invalid input(s): POCBNP   CBG: No results for input(s): GLUCAP in the last 168 hours.     Studies: Dg Chest 2 View  10/09/2015  CLINICAL DATA:  Chest pain EXAM: CHEST  2 VIEW COMPARISON:  10/04/2015 FINDINGS: COPD with hyperinflation of the lungs. Elevated right hemidiaphragm. Very large hiatal hernia with air-fluid level. Lungs are clear without infiltrate or effusion. Negative for heart failure. IMPRESSION: COPD without acute cardiopulmonary abnormality Large hiatal hernia. Electronically Signed   By: Franchot Gallo M.D.   On: 10/09/2015 13:50   US Abdomen Complete  10/10/2015  CLINICAL DATA:  Chronic nausea and vomiting.  Initial encounter. EXAM: ABDOMEN ULTRASOUND COMPLETE COMPARISON:  None. FINDINGS: Gallbladder: No gallstones or wall thickening visualized. No sonographic Murphy sign noted by sonographer. Common bile duct: Diameter: 0.3 cm, within normal limits in caliber. Liver: No focal lesion  identified. Mildly increased parenchymal echogenicity, likely reflecting fatty infiltration. IVC: No abnormality visualized. Pancreas: Visualized portion unremarkable. Spleen: Size and appearance within normal limits. Right Kidney: Length: 10.3 cm. Echogenicity within normal limits. No mass or hydronephrosis visualized. Left Kidney: Length: 10.3 cm. Echogenicity within normal limits. No mass or hydronephrosis visualized. Abdominal aorta: No aneurysm visualized. Mild calcification is noted along the abdominal aorta. Other findings: None. IMPRESSION: 1. No acute abnormality seen within the abdomen. 2. Mild calcification along the abdominal aorta. 3. Mildly fatty infiltration within the liver. Electronically Signed   By: Garald Balding M.D.   On: 10/10/2015 00:14      No results found for: HGBA1C Lab Results  Component Value Date   CREATININE 0.96 10/10/2015  Scheduled Meds: . calcium-vitamin D  1 tablet Oral Q breakfast  . [START ON 10/11/2015] cefTRIAXone (ROCEPHIN)  IV  1 g Intravenous Q24H  . clonazePAM  0.25-0.5 mg Oral BID  . diltiazem (CARDIZEM) infusion  5-15 mg/hr Intravenous Once  . levothyroxine  88 mcg Oral QAC breakfast  . metoprolol  50 mg Oral BID  . multivitamin with minerals  1 tablet Oral Daily  . pantoprazole (PROTONIX) IV  40 mg Intravenous Q24H  . pneumococcal 23 valent vaccine  0.5 mL Intramuscular Tomorrow-1000  . Warfarin - Pharmacist Dosing Inpatient   Does not apply q1800   Continuous Infusions: . sodium chloride 50 mL/hr at 10/10/15 0005     LOS: 1 day    Time spent: >30 MINS    [facility] Pager [phone]. If 7PM-7AM, please contact night-coverage at [email or web address], password [facility] 10/10/2015, 8:02 AM  LOS: 1 day

## 2015-10-10 NOTE — Consult Note (Signed)
[name] for Coumadin Indication: atrial fibrillation  Allergies  Allergen Reactions  . Iodinated Diagnostic Agents Shortness Of Breath    headache    Vital Signs: Temp: 98.5 F (36.9 C) (04/28 0741) Temp Source: Oral (04/28 0741) BP: 107/95 mmHg (04/28 0741) Pulse Rate: 73 (04/28 0741)  Labs:  Recent Labs  10/07/15 1325 10/09/15 1305 10/09/15 1807 10/09/15 2222 10/10/15 0338  HGB  --  15.1*  --   --  12.6  HCT  --  43.6  --   --  36.6  PLT  --  194  --   --  159  LABPROT  --   --  28.3*  --  31.1*  INR  --   --  2.70*  --  3.06*  CREATININE  --  1.01*  --   --  0.96  TROPONINI 0.03  --   --  <0.03 0.03    Estimated Creatinine Clearance: 57.9 mL/min (by C-G formula based on Cr of 0.96).   Medical History: Past Medical History  Diagnosis Date  . Dizziness     chronic and of an unclear etiology  . Hypothyroidism   . HTN (hypertension)   . Hyperlipemia   . Obesity   . Osteoporosis   . Gastritis   . Vitamin D deficiency   . Atrial tachycardia ([place])     ablated 11/17/10  by JA  from the [facility] of the aorta  . Atrial flutter ([place])     typical appearing  . Atrial fibrillation ([place])   . GERD (gastroesophageal reflux disease)    Assessment: 82yof on coumadin pta for afib, being admitted with abdominal pain. Coumadin to continue. INR 3.  Home dose: 5mg  daily except 2.5mg  on Thursday - last taken 4/26  Goal of Therapy:  INR 2-3 Monitor platelets by anticoagulation protocol: Yes   Plan:  1) Coumadin 5 mg x 1 2) Daily INR  Charlotte Castro, Charlotte Castro 10/10/2015,11:19 AM

## 2015-10-10 NOTE — Progress Notes (Signed)
Pharmacy Antibiotic Note  Charlotte Castro is a 82 y.o. female admitted on 10/09/2015 with UTI.  Pharmacy has been consulted for Ceftriaxone dosing.  Plan: -Ceftriaxone 1g IV q24h -F/U urine culture for directed therapy  Height: 5\' 8"  (172.7 cm) Weight: 221 lb 5.5 oz (100.4 kg) IBW/kg (Calculated) : 63.9  Temp (24hrs), Avg:97.9 F (36.6 C), Min:97.8 F (36.6 C), Max:98 F (36.7 C)   Recent Labs Lab 10/04/15 1210 10/07/15 0904 10/09/15 1305  WBC 8.5 9.6 8.8  CREATININE 1.10* 1.01* 1.01*    Estimated Creatinine Clearance: 55.1 mL/min (by C-G formula based on Cr of 1.01).    Allergies  Allergen Reactions  . Iodinated Diagnostic Agents Shortness Of Breath    headache   Charlotte Castro 10/10/2015 12:33 AM

## 2015-10-11 LAB — BASIC METABOLIC PANEL
ANION GAP: 8 (ref 5–15)
BUN: 9 mg/dL (ref 6–20)
CHLORIDE: 113 mmol/L — AB (ref 101–111)
CO2: 23 mmol/L (ref 22–32)
Calcium: 8.7 mg/dL — ABNORMAL LOW (ref 8.9–10.3)
Creatinine, Ser: 0.83 mg/dL (ref 0.44–1.00)
GFR calc non Af Amer: >60 mL/min (ref >60)
GLUCOSE: 85 mg/dL (ref 65–99)
Potassium: 3.9 mmol/L (ref 3.5–5.1)
Sodium: 144 mmol/L (ref 135–145)

## 2015-10-11 LAB — CBC
HEMATOCRIT: 33.4 % — AB (ref 36.0–46.0)
HEMOGLOBIN: 11 g/dL — AB (ref 12.0–15.0)
MCH: 32.6 pg (ref 26.0–34.0)
MCHC: 32.9 g/dL (ref 30.0–36.0)
MCV: 99.1 fL (ref 78.0–100.0)
Platelets: 153 10*3/uL (ref 150–400)
RBC: 3.37 MIL/uL — ABNORMAL LOW (ref 3.87–5.11)
RDW: 12.5 % (ref 11.5–15.5)
WBC: 10.1 10*3/uL (ref 4.0–10.5)

## 2015-10-11 LAB — PROTIME-INR
INR: 3.09 — AB (ref 0.00–1.49)
Prothrombin Time: 31.3 seconds — ABNORMAL HIGH (ref 11.6–15.2)

## 2015-10-11 MED ORDER — WARFARIN SODIUM 5 MG PO TABS
5.0000 mg | ORAL_TABLET | Freq: Once | ORAL | Status: AC
  Administered 2015-10-11: 5 mg via ORAL
  Filled 2015-10-11: qty 1

## 2015-10-11 NOTE — Consult Note (Signed)
ANTICOAGULATION CONSULT NOTE - Follow up [facility] for Coumadin Indication: atrial fibrillation  Allergies  Allergen Reactions  . Iodinated Diagnostic Agents Shortness Of Breath    headache    Vital Signs: Temp: 97.5 F (36.4 C) (04/29 0822) Temp Source: Oral (04/29 0822) BP: 154/81 mmHg (04/29 0822) Pulse Rate: 88 (04/29 0822)  Labs:  Recent Labs  10/09/15 1305 10/09/15 1807 10/09/15 2222 10/10/15 0338 10/10/15 1030 10/11/15 0409  HGB 15.1*  --   --  12.6  --  11.0*  HCT 43.6  --   --  36.6  --  33.4*  PLT 194  --   --  159  --  153  LABPROT  --  28.3*  --  31.1*  --  31.3*  INR  --  2.70*  --  3.06*  --  3.09*  CREATININE 1.01*  --   --  0.96  --  0.83  TROPONINI  --   --  <0.03 0.03 <0.03  --     Estimated Creatinine Clearance: 67 mL/min (by C-G formula based on Cr of 0.83).  Assessment: 82yof on coumadin pta for afib, admitted with abdominal pain. Coumadin continued. INR therapeutic at 3.09. Abdominal US and CT abdomen negative. HIDA scan ordered and GI consulted for further recommendations - may need to hold coumadin at some point if procedures needed.  Home dose: 5mg  daily except 2.5mg  on Thursday - last taken 4/26  Goal of Therapy:  INR 2-3 Monitor platelets by anticoagulation protocol: Yes   Plan:  1) Coumadin 5 mg x 1 2) Daily INR 3) Follow up GI recs  Deboraha Sprang 10/11/2015,8:41 AM

## 2015-10-11 NOTE — Consult Note (Signed)
Subjective:   HPI  The patient is an 82 year old female who we are asked to see in regards to ongoing chronic nausea for the past month. Initially she states she did have some vomiting. She was admitted to the hospital because of the ongoing nausea. She has a history of chronic atrial fibrillation, hypothyroidism, and hypertension. She has been on Coumadin. She had an abdominal ultrasound which did not reveal gallstones. She does have some fatty liver on ultrasound. Liver enzymes were unremarkable. Basic metabolic panel was unremarkable with nothing to explain a cause for nausea. A CT of the abdomen was done.It showed a large hiatal hernia. The patient has been experiencing some epigastric discomfort over the past month. I asked her if she had been on any medication for this and she stated no.  Review of Systems Denies chest pain  Past Medical History  Diagnosis Date  . Dizziness     chronic and of an unclear etiology  . Hypothyroidism   . HTN (hypertension)   . Hyperlipemia   . Obesity   . Osteoporosis   . Gastritis   . Vitamin D deficiency   . Atrial tachycardia ([place])     ablated 11/17/10  by JA  from the [facility] of the aorta  . Atrial flutter ([place])     typical appearing  . Atrial fibrillation ([place])   . GERD (gastroesophageal reflux disease)    Past Surgical History  Procedure Laterality Date  . Atrial ablation surgery  11/17/10    Atrial tachycardia arising from [facility] of the aorta ablated by [place] History   Social History  . Marital Status: Single    Spouse Name: N/A  . Number of Children: N/A  . Years of Education: N/A   Occupational History  . Not on file.   Social History Main Topics  . Smoking status: Never Smoker   . Smokeless tobacco: Never Used  . Alcohol Use: No  . Drug Use: No  . Sexual Activity: Not on file   Other Topics Concern  . Not on file   Social History Narrative   family history includes Heart attack in her father; Hypertension in her  father.  Current facility-administered medications:  .  acetaminophen (TYLENOL) tablet 650 mg, 650 mg, Oral, Q6H PRN **OR** acetaminophen (TYLENOL) suppository 650 mg, 650 mg, Rectal, Q6H PRN, Rise Patience, MD .  calcium-vitamin D (OSCAL WITH D) 500-200 MG-UNIT per tablet 1 tablet, 1 tablet, Oral, Q breakfast, Rise Patience, MD, 1 tablet at 10/10/15 1356 .  cefTRIAXone (ROCEPHIN) 1 g in dextrose 5 % 50 mL IVPB, 1 g, Intravenous, Q24H, Erenest Blank, RPH .  clonazePAM (KLONOPIN) tablet 0.25-0.5 mg, 0.25-0.5 mg, Oral, BID, Rise Patience, MD, 0.5 mg at 10/10/15 2111 .  levothyroxine (SYNTHROID, LEVOTHROID) tablet 88 mcg, 88 mcg, Oral, QAC breakfast, Rise Patience, MD, 88 mcg at 10/11/15 0749 .  metoprolol tartrate (LOPRESSOR) tablet 50 mg, 50 mg, Oral, BID, Rise Patience, MD, 50 mg at 10/10/15 2111 .  multivitamin with minerals tablet 1 tablet, 1 tablet, Oral, Daily, Rise Patience, MD, 1 tablet at 10/10/15 1521 .  ondansetron (ZOFRAN) tablet 4 mg, 4 mg, Oral, Q6H PRN **OR** ondansetron (ZOFRAN) injection 4 mg, 4 mg, Intravenous, Q6H PRN, Rise Patience, MD, 4 mg at 10/11/15 0106 .  pantoprazole (PROTONIX) injection 40 mg, 40 mg, Intravenous, Q24H, Rise Patience, MD, 40 mg at 10/10/15 1953 .  pneumococcal 23 valent vaccine (PNU-IMMUNE) injection  0.5 mL, 0.5 mL, Intramuscular, Tomorrow-1000, Rise Patience, MD .  promethazine (PHENERGAN) tablet 12.5 mg, 12.5 mg, Oral, Q6H PRN, Rise Patience, MD .  warfarin (COUMADIN) tablet 5 mg, 5 mg, Oral, ONCE-1800, Otilio Miu, RPH .  Warfarin - Pharmacist Dosing Inpatient, , Does not apply, q1800, Otilio Miu, [facility] Allergies  Allergen Reactions  . Iodinated Diagnostic Agents Shortness Of Breath    headache     Objective:     BP 154/81 mmHg  Pulse 88  Temp(Src) 97.5 F (36.4 C) (Oral)  Resp 22  Ht 5\' 8"  (1.727 m)  Wt 100.4 kg (221 lb 5.5 oz)  BMI 33.66 kg/m2  SpO2 100%  He is in  no distress  Nonicteric  Heart regular rhythm no murmurs  Lungs clear  Abdomen: Bowel sounds normal, soft, mild tenderness in the epigastrium.  Laboratory No components found for: D1    Assessment:     Chronic nausea over the past month of uncertain etiology Large hiatal hernia  Multiple medical problems as stated above.      Plan:     I would recommend PPI therapy. With her large hiatal hernia she could be experiencing some reflux symptoms. This may cause some nausea. There is a HIDA scan already ordered. We will see what that shows and make further decisions. We will also see what her response to PPI therapy is. A complete metabolic profile has been ordered also and is pending. Lab Results  Component Value Date   HGB 11.0* 10/11/2015   HGB 12.6 10/10/2015   HGB 15.1* 10/09/2015   HCT 33.4* 10/11/2015   HCT 36.6 10/10/2015   HCT 43.6 10/09/2015   ALKPHOS 60 10/10/2015   ALKPHOS 71 10/09/2015   ALKPHOS 129* 08/07/2012   AST 17 10/10/2015   AST 22 10/09/2015   AST 28 08/07/2012   ALT 15 10/10/2015   ALT 18 10/09/2015   ALT 11 08/07/2012

## 2015-10-11 NOTE — Progress Notes (Signed)
Report given to Rodena Piety, RN on 6E. Patient transferred via wheelchair with belongings. CCMD notified of transfer, patient will be on telemetry on 6E12.   Milford Cage, RN

## 2015-10-11 NOTE — Progress Notes (Signed)
[facility] PROGRESS NOTE  Charlotte Castro [id number] DOB: April 06, 1933 DOA: 10/09/2015   PCP: Antony Blackbird, MD     Assessment/Plan: Principal Problem:   Nausea & vomiting Active Problems:   Essential hypertension, benign   Atrial fibrillation with RVR ([place])   Dehydration    HPI: Charlotte Castro is a 82 y.o. female history of chronic atrial fibrillation, hypothyroidism, hypertension presents to the ER because of persistent nausea with vomiting. Patient states she has been having these symptoms over the last 1 month and has epigastric discomfort along with it. Patient's symptoms are mostly worse in the morning. Patient states having oat meal helps her. During her recent urgent care visit patient did state that area but really denies any diarrhea. In the ER patient also was found to be tachycardic with A. fib with RVR. Patient not sure if she had taken her metoprolol. Patient will be admitted for further management of her nausea vomiting and A. fib with RVR. Patient also had some brief episode chest pain 2-3 days ago but denies any chest pain at this time  Assessment and plan  Nausea vomiting with epigastric discomfort - Differentials include peptic ulcer disease versus gallbladder pathology vs gastroesophageal reflux. Abdominal ultrasound shows fatty liver but otherwise no other pathology. For now Patient on full liquid diet.  CT scan of the abdomen and pelvis negative. Cardiac enzymes negative. . As per cardiology notes in 2012 cardiac cath was unremarkable. Consult [facility] for further recommendations. Also schedule the patient for HIDA scan to evaluate for biliary dyskinesia   A. fib with RVR - not sure patient was compliant with her metoprolol. Patient was  started on Cardizem infusion in the ER by the ER physician. Off Cardizem drip, continue metoprolol metoprolol.   Patient previously was not tolerant to amiodarone as per the cardiology notes. Patient's chads 2 vasc score  is 4. Patient is on Coumadin per pharmacy.  Hypertension - on metoprolol.  Chronic kidney disease stage II - creatinine appears to be at baseline.  Possible UTI on ceftriaxone and follow urine cultures.  Hypothyroidism on Synthroid.    DVT prophylaxsis Coumadin    Code Status:  Full code     Family Communication: Discussed in detail with the patient, all imaging results, lab results explained to the patient   Disposition Plan: Transfer to telemetry, anticipate discharge in one to 2 days      Consultants:  None  Procedures:  None  Antibiotics: Rocephin 04/28-        HPI/Subjective: Continues to have nausea normal sinus rhythm overnight  Objective: Filed Vitals:   10/10/15 2111 10/11/15 0117 10/11/15 0406 10/11/15 0822  BP: 149/71 139/59 114/95 154/81  Pulse: 79 77 84 88  Temp:  97.3 F (36.3 C) 97.3 F (36.3 C) 97.5 F (36.4 C)  TempSrc:  Oral Oral Oral  Resp:  19 17 22   Height:      Weight:      SpO2:  98% 93% 100%    Intake/Output Summary (Last 24 hours) at 10/11/15 0835 Last data filed at 10/11/15 0757  Gross per 24 hour  Intake   1475 ml  Output   4125 ml  Net  -2650 ml    Exam:  Examination:  General exam: Appears calm and comfortable  Respiratory system: Clear to auscultation. Respiratory effort normal. Cardiovascular system: S1 & S2 heard, RRR. No JVD, murmurs, rubs, gallops or clicks. No pedal edema. Gastrointestinal system: Abdomen  is nondistended, soft and nontender. No organomegaly or masses felt. Normal bowel sounds heard. Central nervous system: Alert and oriented. No focal neurological deficits. Extremities: Symmetric 5 x 5 power. Skin: No rashes, lesions or ulcers Psychiatry: Judgement and insight appear normal. Mood & affect appropriate.     Data Reviewed: I have personally reviewed following labs and imaging studies  Micro Results Recent Results (from the past 240 hour(s))  MRSA PCR Screening     Status: None    Collection Time: 10/09/15 10:36 PM  Result Value Ref Range Status   MRSA by PCR NEGATIVE NEGATIVE Final    Comment:        The GeneXpert MRSA Assay (FDA approved for NASAL specimens only), is one component of a comprehensive MRSA colonization surveillance program. It is not intended to diagnose MRSA infection nor to guide or monitor treatment for MRSA infections.     Radiology Reports Ct Abdomen Pelvis Wo Contrast  10/10/2015  CLINICAL DATA:  Intractable nausea and vomiting. Epigastric tenderness. EXAM: CT ABDOMEN AND PELVIS WITHOUT CONTRAST TECHNIQUE: Multidetector CT imaging of the abdomen and pelvis was performed following the standard protocol without IV contrast. COMPARISON:  Ultrasound 10/09/2015 FINDINGS: Lower chest: Large hiatal hernia in the right lower chest. Heart is normal size. Right basilar atelectasis. No confluent opacity on the left. No effusions. Hepatobiliary: No focal hepatic abnormality. Gallbladder unremarkable. Pancreas: No focal abnormality or ductal dilatation. Spleen: No focal abnormality.  Normal size. Adrenals/Urinary Tract: No adrenal abnormality. No focal renal abnormality. No stones or hydronephrosis. Urinary bladder is unremarkable. Stomach/Bowel: Stomach, large and small bowel grossly unremarkable. Vascular/Lymphatic: No evidence of aneurysm or adenopathy. Aortic calcifications. Reproductive: Uterus and adnexa unremarkable.  No mass. Other: No free fluid or free air. Musculoskeletal: No acute bony abnormality or focal bone lesion. Postoperative changes in the lower lumbar spine. IMPRESSION: No acute findings in the abdomen or pelvis. Large hiatal hernia.  Adjacent right base atelectasis. Aortic atherosclerosis. Electronically Signed   By: Rolm Baptise M.D.   On: 10/10/2015 11:37   Dg Chest 2 View  10/09/2015  CLINICAL DATA:  Chest pain EXAM: CHEST  2 VIEW COMPARISON:  10/04/2015 FINDINGS: COPD with hyperinflation of the lungs. Elevated right hemidiaphragm. Very  large hiatal hernia with air-fluid level. Lungs are clear without infiltrate or effusion. Negative for heart failure. IMPRESSION: COPD without acute cardiopulmonary abnormality Large hiatal hernia. Electronically Signed   By: Franchot Gallo M.D.   On: 10/09/2015 13:50   Dg Chest 2 View  10/04/2015  CLINICAL DATA:  Weakness for 2 days EXAM: CHEST  2 VIEW COMPARISON:  08/07/2012 FINDINGS: Normal cardiac silhouette. Large hiatal hernia posterior the heart. There is peripheral linear interstitial pattern similar to comparison exam. No focal infiltrate. No pneumothorax. IMPRESSION: 1. Interstitial edema. 2. Large hiatal hernia. Electronically Signed   By: Suzy Bouchard M.D.   On: 10/04/2015 13:48   US Abdomen Complete  10/10/2015  CLINICAL DATA:  Chronic nausea and vomiting.  Initial encounter. EXAM: ABDOMEN ULTRASOUND COMPLETE COMPARISON:  None. FINDINGS: Gallbladder: No gallstones or wall thickening visualized. No sonographic Murphy sign noted by sonographer. Common bile duct: Diameter: 0.3 cm, within normal limits in caliber. Liver: No focal lesion identified. Mildly increased parenchymal echogenicity, likely reflecting fatty infiltration. IVC: No abnormality visualized. Pancreas: Visualized portion unremarkable. Spleen: Size and appearance within normal limits. Right Kidney: Length: 10.3 cm. Echogenicity within normal limits. No mass or hydronephrosis visualized. Left Kidney: Length: 10.3 cm. Echogenicity within normal limits. No mass or hydronephrosis visualized.  Abdominal aorta: No aneurysm visualized. Mild calcification is noted along the abdominal aorta. Other findings: None. IMPRESSION: 1. No acute abnormality seen within the abdomen. 2. Mild calcification along the abdominal aorta. 3. Mildly fatty infiltration within the liver. Electronically Signed   By: Garald Balding M.D.   On: 10/10/2015 00:14     CBC  Recent Labs Lab 10/04/15 1210 10/04/15 1213 10/07/15 0904 10/09/15 1305 10/10/15 0338  10/11/15 0409  WBC 8.5  --  9.6 8.8 7.0 10.1  HGB 15.0  --  14.5 15.1* 12.6 11.0*  HCT 42.5  --  42.0 43.6 36.6 33.4*  PLT 183  --  176 194 159 153  MCV 92.8  --  93.5 93.6 94.3 99.1  MCH 32.8  --  32.3 32.4 32.5 32.6  MCHC 35.3  --  34.5 34.6 34.4 32.9  RDW 13.8  --  13.8 13.8 14.0 12.5  LYMPHSABS  --  2.7 1.1  --   --   --   MONOABS  --  0.9 0.9  --   --   --   EOSABS  --  0.1 0.2  --   --   --   BASOSABS  --  0.1 0.0  --   --   --     Chemistries   Recent Labs Lab 10/04/15 1210 10/04/15 1213 10/07/15 0904 10/09/15 1305 10/09/15 1807 10/10/15 0338 10/11/15 0409  NA 143  --  141 143  --  144 144  K 4.2  --  4.2 4.4  --  3.7 3.9  CL 109  --  109 109  --  112* 113*  CO2 19*  --  24 23  --  24 23  GLUCOSE 91  --  98 131*  --  91 85  BUN 10  --  10 11  --  11 9  CREATININE 1.10*  --  1.01* 1.01*  --  0.96 0.83  CALCIUM 9.9  --  10.0 9.9  --  8.6* 8.7*  MG  --  1.7  --   --   --   --   --   AST  --   --   --   --  22 17  --   ALT  --   --   --   --  18 15  --   ALKPHOS  --   --   --   --  71 60  --   BILITOT  --   --   --   --  1.1 1.0  --    ------------------------------------------------------------------------------------------------------------------ estimated creatinine clearance is 67 mL/min (by C-G formula based on Cr of 0.83). ------------------------------------------------------------------------------------------------------------------ No results for input(s): HGBA1C in the last 72 hours. ------------------------------------------------------------------------------------------------------------------ No results for input(s): CHOL, HDL, LDLCALC, TRIG, CHOLHDL, LDLDIRECT in the last 72 hours. ------------------------------------------------------------------------------------------------------------------  Recent Labs  10/09/15 1807  TSH 3.141    ------------------------------------------------------------------------------------------------------------------ No results for input(s): VITAMINB12, FOLATE, FERRITIN, TIBC, IRON, RETICCTPCT in the last 72 hours.  Coagulation profile  Recent Labs Lab 10/04/15 1213 10/07/15 1026 10/09/15 1807 10/10/15 0338 10/11/15 0409  INR 2.54* 3.26* 2.70* 3.06* 3.09*    No results for input(s): DDIMER in the last 72 hours.  Cardiac Enzymes  Recent Labs Lab 10/09/15 2222 10/10/15 0338 10/10/15 1030  TROPONINI <0.03 0.03 <0.03   ------------------------------------------------------------------------------------------------------------------ Invalid input(s): POCBNP   CBG: No results for input(s): GLUCAP in the last 168 hours.     Studies: Ct Abdomen Pelvis Wo Contrast  10/10/2015  CLINICAL DATA:  Intractable nausea and vomiting. Epigastric tenderness. EXAM: CT ABDOMEN AND PELVIS WITHOUT CONTRAST TECHNIQUE: Multidetector CT imaging of the abdomen and pelvis was performed following the standard protocol without IV contrast. COMPARISON:  Ultrasound 10/09/2015 FINDINGS: Lower chest: Large hiatal hernia in the right lower chest. Heart is normal size. Right basilar atelectasis. No confluent opacity on the left. No effusions. Hepatobiliary: No focal hepatic abnormality. Gallbladder unremarkable. Pancreas: No focal abnormality or ductal dilatation. Spleen: No focal abnormality.  Normal size. Adrenals/Urinary Tract: No adrenal abnormality. No focal renal abnormality. No stones or hydronephrosis. Urinary bladder is unremarkable. Stomach/Bowel: Stomach, large and small bowel grossly unremarkable. Vascular/Lymphatic: No evidence of aneurysm or adenopathy. Aortic calcifications. Reproductive: Uterus and adnexa unremarkable.  No mass. Other: No free fluid or free air. Musculoskeletal: No acute bony abnormality or focal bone lesion. Postoperative changes in the lower lumbar spine. IMPRESSION: No acute  findings in the abdomen or pelvis. Large hiatal hernia.  Adjacent right base atelectasis. Aortic atherosclerosis. Electronically Signed   By: Rolm Baptise M.D.   On: 10/10/2015 11:37   Dg Chest 2 View  10/09/2015  CLINICAL DATA:  Chest pain EXAM: CHEST  2 VIEW COMPARISON:  10/04/2015 FINDINGS: COPD with hyperinflation of the lungs. Elevated right hemidiaphragm. Very large hiatal hernia with air-fluid level. Lungs are clear without infiltrate or effusion. Negative for heart failure. IMPRESSION: COPD without acute cardiopulmonary abnormality Large hiatal hernia. Electronically Signed   By: Franchot Gallo M.D.   On: 10/09/2015 13:50   US Abdomen Complete  10/10/2015  CLINICAL DATA:  Chronic nausea and vomiting.  Initial encounter. EXAM: ABDOMEN ULTRASOUND COMPLETE COMPARISON:  None. FINDINGS: Gallbladder: No gallstones or wall thickening visualized. No sonographic Murphy sign noted by sonographer. Common bile duct: Diameter: 0.3 cm, within normal limits in caliber. Liver: No focal lesion identified. Mildly increased parenchymal echogenicity, likely reflecting fatty infiltration. IVC: No abnormality visualized. Pancreas: Visualized portion unremarkable. Spleen: Size and appearance within normal limits. Right Kidney: Length: 10.3 cm. Echogenicity within normal limits. No mass or hydronephrosis visualized. Left Kidney: Length: 10.3 cm. Echogenicity within normal limits. No mass or hydronephrosis visualized. Abdominal aorta: No aneurysm visualized. Mild calcification is noted along the abdominal aorta. Other findings: None. IMPRESSION: 1. No acute abnormality seen within the abdomen. 2. Mild calcification along the abdominal aorta. 3. Mildly fatty infiltration within the liver. Electronically Signed   By: Garald Balding M.D.   On: 10/10/2015 00:14      No results found for: HGBA1C Lab Results  Component Value Date   CREATININE 0.83 10/11/2015       Scheduled Meds: . calcium-vitamin D  1 tablet Oral Q  breakfast  . cefTRIAXone (ROCEPHIN)  IV  1 g Intravenous Q24H  . clonazePAM  0.25-0.5 mg Oral BID  . levothyroxine  88 mcg Oral QAC breakfast  . metoprolol  50 mg Oral BID  . multivitamin with minerals  1 tablet Oral Daily  . pantoprazole (PROTONIX) IV  40 mg Intravenous Q24H  . pneumococcal 23 valent vaccine  0.5 mL Intramuscular Tomorrow-1000  . Warfarin - Pharmacist Dosing Inpatient   Does not apply q1800   Continuous Infusions:     LOS: 2 days    Time spent: >30 MINS    [facility] Pager [phone]. If 7PM-7AM, please contact night-coverage at [email or web address], password [facility] 10/11/2015, 8:35 AM  LOS: 2 days

## 2015-10-12 ENCOUNTER — Inpatient Hospital Stay (HOSPITAL_COMMUNITY): Payer: Medicare Other

## 2015-10-12 DIAGNOSIS — K279 Peptic ulcer, site unspecified, unspecified as acute or chronic, without hemorrhage or perforation: Secondary | ICD-10-CM | POA: Diagnosis not present

## 2015-10-12 DIAGNOSIS — G43A1 Cyclical vomiting, intractable: Secondary | ICD-10-CM

## 2015-10-12 LAB — COMPREHENSIVE METABOLIC PANEL
ALBUMIN: 3.1 g/dL — AB (ref 3.5–5.0)
ALK PHOS: 73 U/L (ref 38–126)
ALT: 13 U/L — AB (ref 14–54)
AST: 19 U/L (ref 15–41)
Anion gap: 7 (ref 5–15)
BILIRUBIN TOTAL: 0.8 mg/dL (ref 0.3–1.2)
BUN: 8 mg/dL (ref 6–20)
CO2: 26 mmol/L (ref 22–32)
CREATININE: 0.92 mg/dL (ref 0.44–1.00)
Calcium: 9.2 mg/dL (ref 8.9–10.3)
Chloride: 109 mmol/L (ref 101–111)
GFR calc Af Amer: >60 mL/min (ref >60)
GFR calc non Af Amer: 57 mL/min — ABNORMAL LOW (ref >60)
GLUCOSE: 92 mg/dL (ref 65–99)
POTASSIUM: 4.2 mmol/L (ref 3.5–5.1)
Sodium: 142 mmol/L (ref 135–145)
Total Protein: 6.1 g/dL — ABNORMAL LOW (ref 6.5–8.1)

## 2015-10-12 LAB — CBC
HEMATOCRIT: 43.5 % (ref 36.0–46.0)
HEMOGLOBIN: 14.9 g/dL (ref 12.0–15.0)
MCH: 32.8 pg (ref 26.0–34.0)
MCHC: 34.3 g/dL (ref 30.0–36.0)
MCV: 95.8 fL (ref 78.0–100.0)
Platelets: 179 10*3/uL (ref 150–400)
RBC: 4.54 MIL/uL (ref 3.87–5.11)
RDW: 13.7 % (ref 11.5–15.5)
WBC: 7.3 10*3/uL (ref 4.0–10.5)

## 2015-10-12 LAB — PROTIME-INR
INR: 3.13 — AB (ref 0.00–1.49)
Prothrombin Time: 31.6 seconds — ABNORMAL HIGH (ref 11.6–15.2)

## 2015-10-12 IMAGING — NM NM HEPATO W/GB/PHARM/[PERSON_NAME]
3 series · 13 of 13 positions shown · non-contrast
Comparison: No priors.

CLINICAL DATA: 82-year-old female with history of abdominal pain
and nausea for the past several weeks.

EXAM:
NUCLEAR MEDICINE HEPATOBILIARY IMAGING WITH GALLBLADDER EF
TECHNIQUE: Sequential images of the abdomen were obtained [DATE] minutes
following intravenous administration of radiopharmaceutical. After
oral ingestion of Ensure, gallbladder ejection fraction was
determined. At 60 min, normal ejection fraction is greater than 33%.
RADIOPHARMACEUTICALS:  5.1 mCi [V5]  Choletec IV

[he hepatobiliary · 2.51mm/px · 1 of 1 slices shown]
[im 1/1]
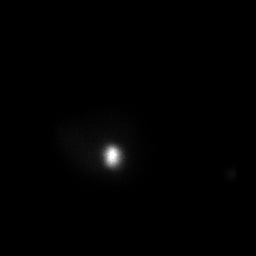

[anterior · 3.43mm/px · 6 of 60 frames shown]
[frame 6/60]
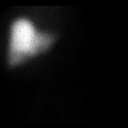
[frame 16/60]
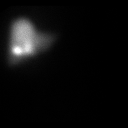
[frame 26/60]
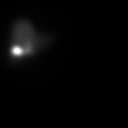
[frame 36/60]
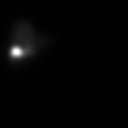
[frame 46/60]
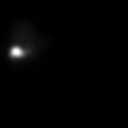
[frame 56/60]
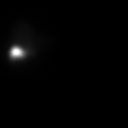

[gb ef · 3.43mm/px · 6 of 60 frames shown]
[frame 6/60]
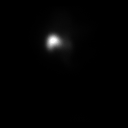
[frame 16/60]
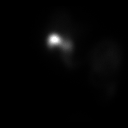
[frame 26/60]
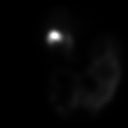
[frame 36/60]
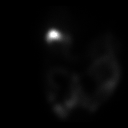
[frame 46/60]
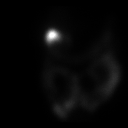
[frame 56/60]
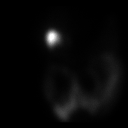

[13 of 13 positions shown; findings below may reference images not displayed]

FINDINGS: Prompt uptake and biliary excretion of activity by the liver is
seen. Gallbladder activity is visualized, consistent with patency of
cystic duct. Biliary activity passes into small bowel, consistent
with patent common bile duct.

Calculated gallbladder ejection fraction is 94%. (Normal gallbladder
ejection fraction with Ensure is greater than 33%.)
IMPRESSION: 1. Normal study.  No evidence of acute cholecystitis.

## 2015-10-12 MED ORDER — WARFARIN SODIUM 2.5 MG PO TABS
2.5000 mg | ORAL_TABLET | Freq: Once | ORAL | Status: AC
  Administered 2015-10-12: 2.5 mg via ORAL
  Filled 2015-10-12: qty 1

## 2015-10-12 MED ORDER — TECHNETIUM TC 99M MEBROFENIN IV KIT
5.1000 | PACK | Freq: Once | INTRAVENOUS | Status: AC | PRN
  Administered 2015-10-12: 5 via INTRAVENOUS

## 2015-10-12 NOTE — Progress Notes (Signed)
Still with complaints of nausea. She is scheduled for a HIDA scan with ejection fraction. I will also order an upper GI.

## 2015-10-12 NOTE — Progress Notes (Signed)
[facility] PROGRESS NOTE  Charlotte Castro [id number] DOB: 25-May-1933 DOA: 10/09/2015   PCP: Charlotte Blackbird, MD     Assessment/Plan: Principal Problem:   Nausea & vomiting Active Problems:   Essential hypertension, benign   Atrial fibrillation with RVR ([place])   Dehydration    HPI: Charlotte Castro is a 82 y.o. female history of chronic atrial fibrillation, hypothyroidism, hypertension presents to the ER because of persistent nausea with vomiting. Patient states she has been having these symptoms over the last 1 month and has epigastric discomfort along with it. Patient's symptoms are mostly worse in the morning. Patient states having oat meal helps her. During her recent urgent care visit patient did state that area but really denies any diarrhea. In the ER patient also was found to be tachycardic with A. fib with RVR. Patient not sure if she had taken her metoprolol. Patient will be admitted for further management of her nausea vomiting and A. fib with RVR. Patient also had some brief episode chest pain 2-3 days ago but denies any chest pain at this time  Assessment and plan  Nausea vomiting with epigastric discomfort - Differentials include peptic ulcer disease versus gallbladder pathology vs gastroesophageal reflux. Abdominal ultrasound shows fatty liver but otherwise no other pathology. For now Patient on full liquid diet.  CT scan of the abdomen and pelvis negative. Cardiac enzymes negative. . As per cardiology notes in 2012 cardiac cath was unremarkable. Appreciate  [facility] input  for  further recommendations. Pending HIDA scan to evaluate for biliary dyskinesia. Started on a PPI   A. fib with RVR - not sure patient was compliant with her metoprolol. Patient was  started on Cardizem infusion in the ER by the ER physician. Off Cardizem drip, continue metoprolol metoprolol.   Patient previously was not tolerant to amiodarone as per the cardiology notes. Patient's chads 2 vasc  score is 4. Patient is on Coumadin per pharmacy.  Hypertension - on metoprolol.  Chronic kidney disease stage II - creatinine appears to be at baseline.  Possible UTI on ceftriaxone 3 days, will DC  Hypothyroidism on Synthroid.    DVT prophylaxsis Coumadin    Code Status:  Full code     Family Communication: Discussed in detail with the patient, all imaging results, lab results explained to the patient   Disposition Plan: Anticipate discharge by GI recommendations      Consultants:  None  Procedures:  None  Antibiotics: Rocephin 04/28-        HPI/Subjective: Nauseous somewhat improved since yesterday after starting PPI  Objective: Filed Vitals:   10/11/15 1500 10/11/15 2052 10/12/15 0603 10/12/15 0607  BP: 166/66 147/77 106/56 144/82  Pulse: 79 79 72 73  Temp: 98.7 F (37.1 C) 98.4 F (36.9 C) 98.6 F (37 C) 98.2 F (36.8 C)  TempSrc: Oral     Resp: 19 21 22 21   Height:      Weight:  98.431 kg (217 lb)    SpO2: 100% 98% 96% 100%    Intake/Output Summary (Last 24 hours) at 10/12/15 0828 Last data filed at 10/12/15 0800  Gross per 24 hour  Intake    485 ml  Output   1500 ml  Net  -1015 ml    Exam:  Examination:  General exam: Appears calm and comfortable  Respiratory system: Clear to auscultation. Respiratory effort normal. Cardiovascular system: S1 & S2 heard, RRR. No JVD, murmurs, rubs, gallops or clicks.  No pedal edema. Gastrointestinal system: Abdomen is nondistended, soft and nontender. No organomegaly or masses felt. Normal bowel sounds heard. Central nervous system: Alert and oriented. No focal neurological deficits. Extremities: Symmetric 5 x 5 power. Skin: No rashes, lesions or ulcers Psychiatry: Judgement and insight appear normal. Mood & affect appropriate.     Data Reviewed: I have personally reviewed following labs and imaging studies  Micro Results Recent Results (from the past 240 hour(s))  MRSA PCR Screening      Status: None   Collection Time: 10/09/15 10:36 PM  Result Value Ref Range Status   MRSA by PCR NEGATIVE NEGATIVE Final    Comment:        The GeneXpert MRSA Assay (FDA approved for NASAL specimens only), is one component of a comprehensive MRSA colonization surveillance program. It is not intended to diagnose MRSA infection nor to guide or monitor treatment for MRSA infections.     Radiology Reports Ct Abdomen Pelvis Wo Contrast  10/10/2015  CLINICAL DATA:  Intractable nausea and vomiting. Epigastric tenderness. EXAM: CT ABDOMEN AND PELVIS WITHOUT CONTRAST TECHNIQUE: Multidetector CT imaging of the abdomen and pelvis was performed following the standard protocol without IV contrast. COMPARISON:  Ultrasound 10/09/2015 FINDINGS: Lower chest: Large hiatal hernia in the right lower chest. Heart is normal size. Right basilar atelectasis. No confluent opacity on the left. No effusions. Hepatobiliary: No focal hepatic abnormality. Gallbladder unremarkable. Pancreas: No focal abnormality or ductal dilatation. Spleen: No focal abnormality.  Normal size. Adrenals/Urinary Tract: No adrenal abnormality. No focal renal abnormality. No stones or hydronephrosis. Urinary bladder is unremarkable. Stomach/Bowel: Stomach, large and small bowel grossly unremarkable. Vascular/Lymphatic: No evidence of aneurysm or adenopathy. Aortic calcifications. Reproductive: Uterus and adnexa unremarkable.  No mass. Other: No free fluid or free air. Musculoskeletal: No acute bony abnormality or focal bone lesion. Postoperative changes in the lower lumbar spine. IMPRESSION: No acute findings in the abdomen or pelvis. Large hiatal hernia.  Adjacent right base atelectasis. Aortic atherosclerosis. Electronically Signed   By: Rolm Baptise M.D.   On: 10/10/2015 11:37   Dg Chest 2 View  10/09/2015  CLINICAL DATA:  Chest pain EXAM: CHEST  2 VIEW COMPARISON:  10/04/2015 FINDINGS: COPD with hyperinflation of the lungs. Elevated right  hemidiaphragm. Very large hiatal hernia with air-fluid level. Lungs are clear without infiltrate or effusion. Negative for heart failure. IMPRESSION: COPD without acute cardiopulmonary abnormality Large hiatal hernia. Electronically Signed   By: Franchot Gallo M.D.   On: 10/09/2015 13:50   Dg Chest 2 View  10/04/2015  CLINICAL DATA:  Weakness for 2 days EXAM: CHEST  2 VIEW COMPARISON:  08/07/2012 FINDINGS: Normal cardiac silhouette. Large hiatal hernia posterior the heart. There is peripheral linear interstitial pattern similar to comparison exam. No focal infiltrate. No pneumothorax. IMPRESSION: 1. Interstitial edema. 2. Large hiatal hernia. Electronically Signed   By: Suzy Bouchard M.D.   On: 10/04/2015 13:48   US Abdomen Complete  10/10/2015  CLINICAL DATA:  Chronic nausea and vomiting.  Initial encounter. EXAM: ABDOMEN ULTRASOUND COMPLETE COMPARISON:  None. FINDINGS: Gallbladder: No gallstones or wall thickening visualized. No sonographic Murphy sign noted by sonographer. Common bile duct: Diameter: 0.3 cm, within normal limits in caliber. Liver: No focal lesion identified. Mildly increased parenchymal echogenicity, likely reflecting fatty infiltration. IVC: No abnormality visualized. Pancreas: Visualized portion unremarkable. Spleen: Size and appearance within normal limits. Right Kidney: Length: 10.3 cm. Echogenicity within normal limits. No mass or hydronephrosis visualized. Left Kidney: Length: 10.3 cm. Echogenicity within normal  limits. No mass or hydronephrosis visualized. Abdominal aorta: No aneurysm visualized. Mild calcification is noted along the abdominal aorta. Other findings: None. IMPRESSION: 1. No acute abnormality seen within the abdomen. 2. Mild calcification along the abdominal aorta. 3. Mildly fatty infiltration within the liver. Electronically Signed   By: Garald Balding M.D.   On: 10/10/2015 00:14     CBC  Recent Labs Lab 10/07/15 0904 10/09/15 1305 10/10/15 0338  10/11/15 0409 10/12/15 0616  WBC 9.6 8.8 7.0 10.1 7.3  HGB 14.5 15.1* 12.6 11.0* 14.9  HCT 42.0 43.6 36.6 33.4* 43.5  PLT 176 194 159 153 179  MCV 93.5 93.6 94.3 99.1 95.8  MCH 32.3 32.4 32.5 32.6 32.8  MCHC 34.5 34.6 34.4 32.9 34.3  RDW 13.8 13.8 14.0 12.5 13.7  LYMPHSABS 1.1  --   --   --   --   MONOABS 0.9  --   --   --   --   EOSABS 0.2  --   --   --   --   BASOSABS 0.0  --   --   --   --     Chemistries   Recent Labs Lab 10/07/15 0904 10/09/15 1305 10/09/15 1807 10/10/15 0338 10/11/15 0409 10/12/15 0616  NA 141 143  --  144 144 142  K 4.2 4.4  --  3.7 3.9 4.2  CL 109 109  --  112* 113* 109  CO2 24 23  --  24 23 26   GLUCOSE 98 131*  --  91 85 92  BUN 10 11  --  11 9 8   CREATININE 1.01* 1.01*  --  0.96 0.83 0.92  CALCIUM 10.0 9.9  --  8.6* 8.7* 9.2  AST  --   --  22 17  --  19  ALT  --   --  18 15  --  13*  ALKPHOS  --   --  71 60  --  73  BILITOT  --   --  1.1 1.0  --  0.8   ------------------------------------------------------------------------------------------------------------------ estimated creatinine clearance is 59.8 mL/min (by C-G formula based on Cr of 0.92). ------------------------------------------------------------------------------------------------------------------ No results for input(s): HGBA1C in the last 72 hours. ------------------------------------------------------------------------------------------------------------------ No results for input(s): CHOL, HDL, LDLCALC, TRIG, CHOLHDL, LDLDIRECT in the last 72 hours. ------------------------------------------------------------------------------------------------------------------  Recent Labs  10/09/15 1807  TSH 3.141   ------------------------------------------------------------------------------------------------------------------ No results for input(s): VITAMINB12, FOLATE, FERRITIN, TIBC, IRON, RETICCTPCT in the last 72 hours.  Coagulation profile  Recent Labs Lab 10/07/15 1026  10/09/15 1807 10/10/15 0338 10/11/15 0409 10/12/15 0616  INR 3.26* 2.70* 3.06* 3.09* 3.13*    No results for input(s): DDIMER in the last 72 hours.  Cardiac Enzymes  Recent Labs Lab 10/09/15 2222 10/10/15 0338 10/10/15 1030  TROPONINI <0.03 0.03 <0.03   ------------------------------------------------------------------------------------------------------------------ Invalid input(s): POCBNP   CBG: No results for input(s): GLUCAP in the last 168 hours.     Studies: Ct Abdomen Pelvis Wo Contrast  10/10/2015  CLINICAL DATA:  Intractable nausea and vomiting. Epigastric tenderness. EXAM: CT ABDOMEN AND PELVIS WITHOUT CONTRAST TECHNIQUE: Multidetector CT imaging of the abdomen and pelvis was performed following the standard protocol without IV contrast. COMPARISON:  Ultrasound 10/09/2015 FINDINGS: Lower chest: Large hiatal hernia in the right lower chest. Heart is normal size. Right basilar atelectasis. No confluent opacity on the left. No effusions. Hepatobiliary: No focal hepatic abnormality. Gallbladder unremarkable. Pancreas: No focal abnormality or ductal dilatation. Spleen: No focal abnormality.  Normal size. Adrenals/Urinary Tract: No  adrenal abnormality. No focal renal abnormality. No stones or hydronephrosis. Urinary bladder is unremarkable. Stomach/Bowel: Stomach, large and small bowel grossly unremarkable. Vascular/Lymphatic: No evidence of aneurysm or adenopathy. Aortic calcifications. Reproductive: Uterus and adnexa unremarkable.  No mass. Other: No free fluid or free air. Musculoskeletal: No acute bony abnormality or focal bone lesion. Postoperative changes in the lower lumbar spine. IMPRESSION: No acute findings in the abdomen or pelvis. Large hiatal hernia.  Adjacent right base atelectasis. Aortic atherosclerosis. Electronically Signed   By: Rolm Baptise M.D.   On: 10/10/2015 11:37      No results found for: HGBA1C Lab Results  Component Value Date   CREATININE 0.92  10/12/2015       Scheduled Meds: . calcium-vitamin D  1 tablet Oral Q breakfast  . cefTRIAXone (ROCEPHIN)  IV  1 g Intravenous Q24H  . clonazePAM  0.25-0.5 mg Oral BID  . levothyroxine  88 mcg Oral QAC breakfast  . metoprolol  50 mg Oral BID  . multivitamin with minerals  1 tablet Oral Daily  . pantoprazole (PROTONIX) IV  40 mg Intravenous Q24H  . pneumococcal 23 valent vaccine  0.5 mL Intramuscular Tomorrow-1000  . Warfarin - Pharmacist Dosing Inpatient   Does not apply q1800   Continuous Infusions:     LOS: 3 days    Time spent: >30 MINS    [facility]  [facility] Pager [phone]. If 7PM-7AM, please contact night-coverage at [email or web address], password [facility] 10/12/2015, 8:28 AM  LOS: 3 days

## 2015-10-12 NOTE — Consult Note (Signed)
ANTICOAGULATION CONSULT NOTE - Follow up [facility] for Coumadin Indication: atrial fibrillation  Allergies  Allergen Reactions  . Iodinated Diagnostic Agents Shortness Of Breath    headache    Vital Signs: Temp: 98.2 F (36.8 C) (04/30 0607) BP: 144/82 mmHg (04/30 0607) Pulse Rate: 73 (04/30 0607)  Labs:  Recent Labs  10/09/15 2222 10/10/15 0338 10/10/15 1030 10/11/15 0409 10/12/15 0616  HGB  --  12.6  --  11.0* 14.9  HCT  --  36.6  --  33.4* 43.5  PLT  --  159  --  153 179  LABPROT  --  31.1*  --  31.3* 31.6*  INR  --  3.06*  --  3.09* 3.13*  CREATININE  --  0.96  --  0.83 0.92  TROPONINI <0.03 0.03 <0.03  --   --     Estimated Creatinine Clearance: 59.8 mL/min (by C-G formula based on Cr of 0.92).  Assessment: 82yof on coumadin pta for afib, admitted with abdominal pain. Coumadin continued. Abdominal US and CT abdomen negative. HIDA scan ordered and GI consulted for further recommendations - may need to hold coumadin at some point if procedures needed. INR just outside of therapeutic range but continuing to creep upward. Will give a reduced dose tonight. Hgb 11.0>14.9, plt WNL. No bleeding reported.   Home dose: 5mg  daily except 2.5mg  on Thursday - last taken 4/26  Goal of Therapy:  INR 2-3 Monitor platelets by anticoagulation protocol: Yes   Plan:  1) Coumadin 2.5 mg x 1 2) Daily INR 3) Follow up GI recs  Stephens November, PharmD Clinical Pharmacy Resident 9:38 AM, 10/12/2015

## 2015-10-13 ENCOUNTER — Inpatient Hospital Stay (HOSPITAL_COMMUNITY): Payer: Medicare Other

## 2015-10-13 LAB — COMPREHENSIVE METABOLIC PANEL
ALBUMIN: 3.2 g/dL — AB (ref 3.5–5.0)
ALT: 13 U/L — ABNORMAL LOW (ref 14–54)
ANION GAP: 9 (ref 5–15)
AST: 18 U/L (ref 15–41)
Alkaline Phosphatase: 72 U/L (ref 38–126)
BILIRUBIN TOTAL: 0.9 mg/dL (ref 0.3–1.2)
BUN: 14 mg/dL (ref 6–20)
CO2: 26 mmol/L (ref 22–32)
Calcium: 9.3 mg/dL (ref 8.9–10.3)
Chloride: 106 mmol/L (ref 101–111)
Creatinine, Ser: 0.92 mg/dL (ref 0.44–1.00)
GFR, EST NON AFRICAN AMERICAN: 57 mL/min — AB (ref >60)
Glucose, Bld: 93 mg/dL (ref 65–99)
POTASSIUM: 4.3 mmol/L (ref 3.5–5.1)
Sodium: 141 mmol/L (ref 135–145)
TOTAL PROTEIN: 6.1 g/dL — AB (ref 6.5–8.1)

## 2015-10-13 LAB — PROTIME-INR
INR: 2.92 — ABNORMAL HIGH (ref 0.00–1.49)
PROTHROMBIN TIME: 30 s — AB (ref 11.6–15.2)

## 2015-10-13 LAB — CBC
HEMATOCRIT: 44.5 % (ref 36.0–46.0)
Hemoglobin: 15.6 g/dL — ABNORMAL HIGH (ref 12.0–15.0)
MCH: 33.5 pg (ref 26.0–34.0)
MCHC: 35.1 g/dL (ref 30.0–36.0)
MCV: 95.7 fL (ref 78.0–100.0)
Platelets: 190 10*3/uL (ref 150–400)
RBC: 4.65 MIL/uL (ref 3.87–5.11)
RDW: 13.5 % (ref 11.5–15.5)
WBC: 8.1 10*3/uL (ref 4.0–10.5)

## 2015-10-13 LAB — URINE CULTURE: Culture: >=100000 — AB

## 2015-10-13 IMAGING — CR DG UGI W/ HIGH DENSITY W/KUB
10 of 14 series · 14 of 24 positions shown · non-contrast
Comparison: CT [DATE]

CLINICAL DATA: Nausea.  Hiatal hernia.

EXAM:
UPPER GI SERIES WITH KUB
TECHNIQUE: After obtaining a scout radiograph a routine upper GI series was
performed using thin barium
FLUOROSCOPY TIME:  Radiation Exposure Index (as provided by the
fluoroscopic device):
If the device does not provide the exposure index:
Fluoroscopy Time (in minutes and seconds):  1 minutes 54 seconds
Number of Acquired Images:  1

[t abdomen supine]
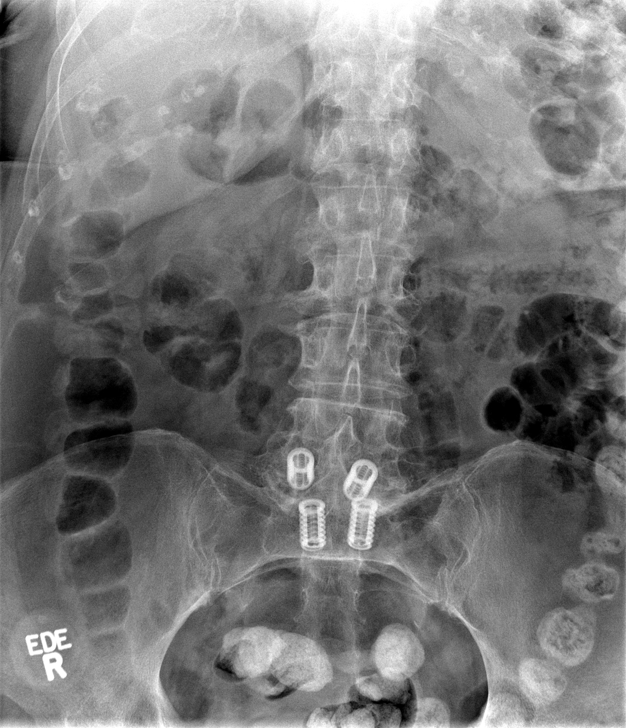

[fluoro_barium 2fps_bw]
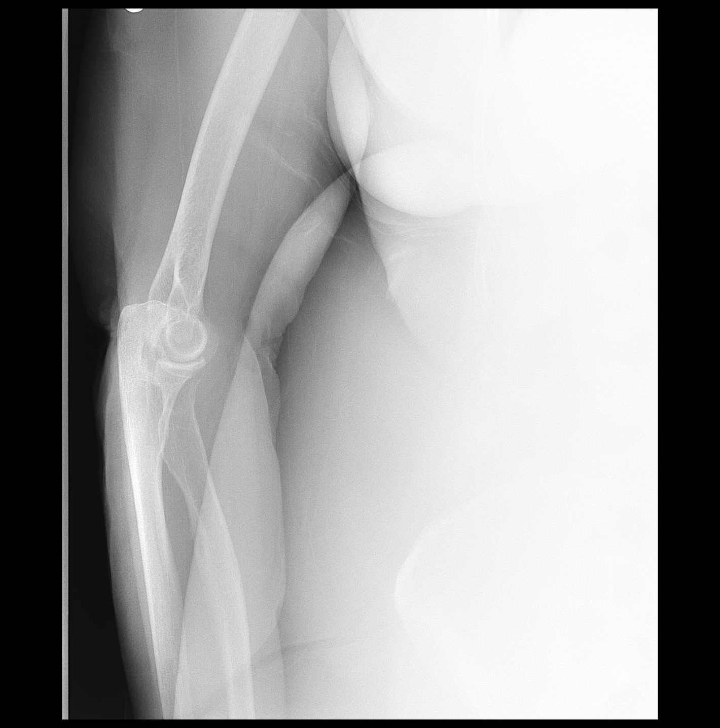

[Series 3: cp_standard · 0.55mm/px · 2 of 297 frames shown (1 of 8)]
[frame 45/297]
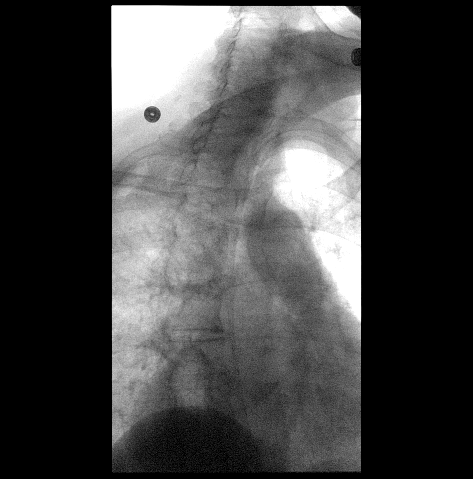
[frame 253/297]
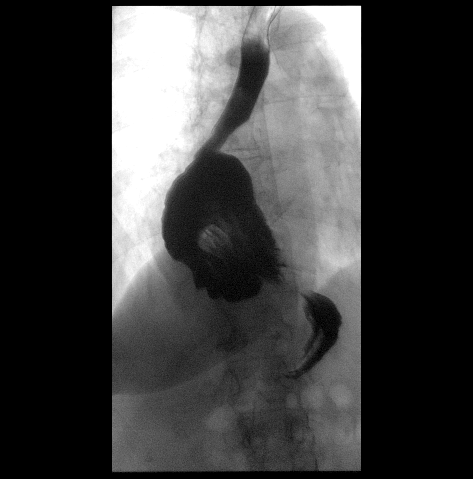

[cp_standard (2 of 8)]
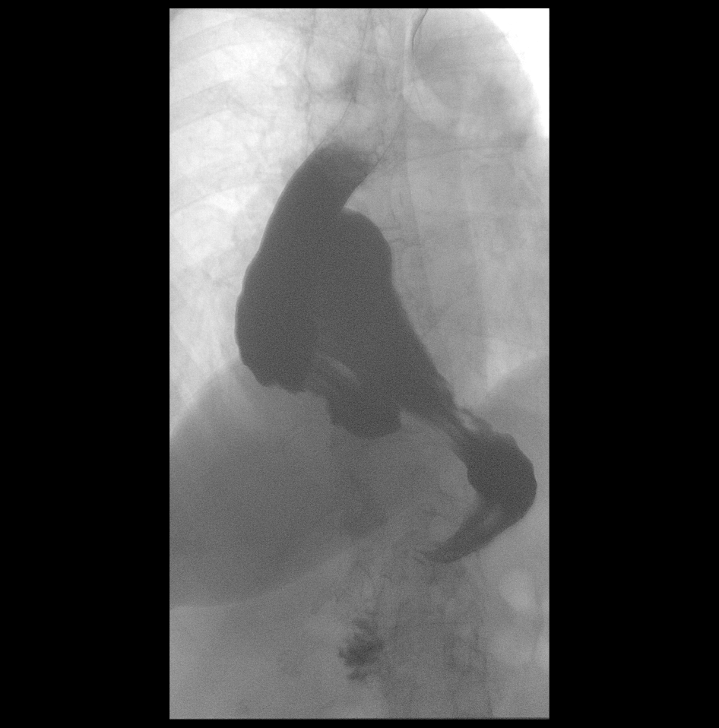

[cp_standard (3 of 8)]
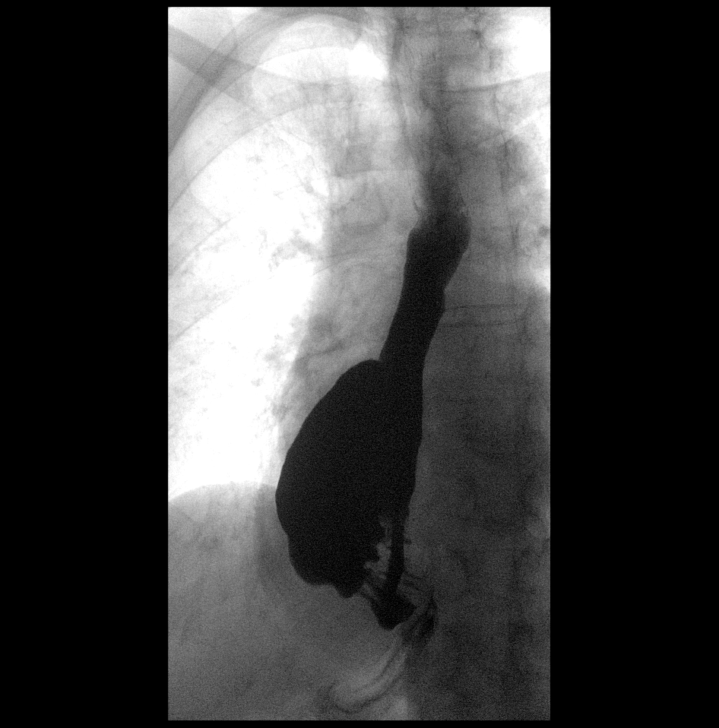

[Series 8: cp_standard · 0.55mm/px · 3 of 124 frames shown (4 of 8)]
[frame 12/124]
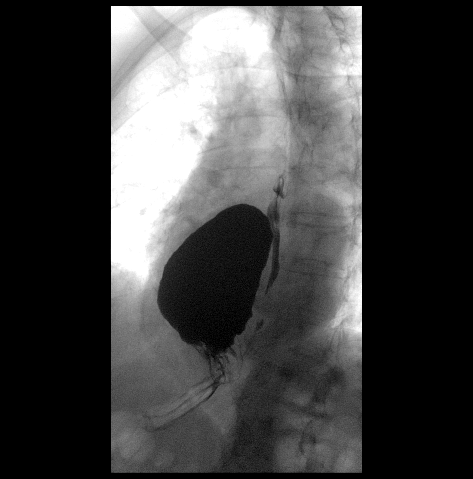
[frame 19/124]
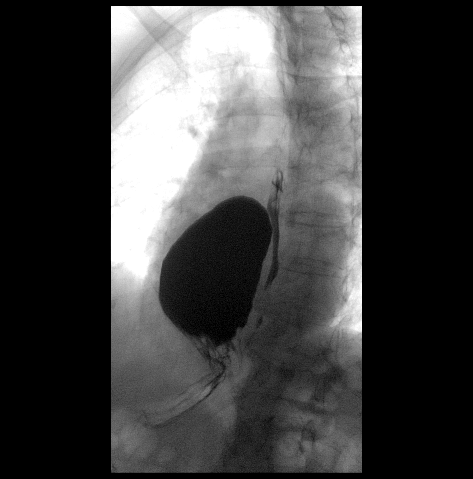
[frame 106/124]
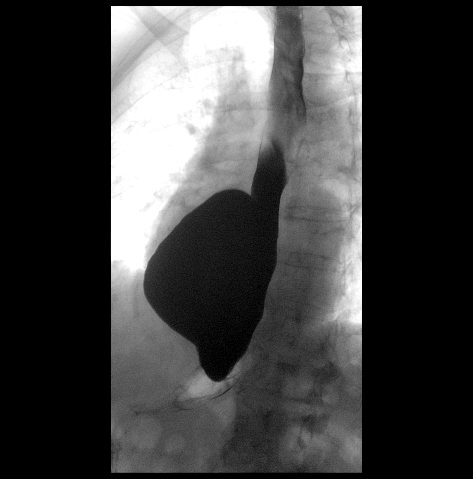

[cp_standard (5 of 8)]
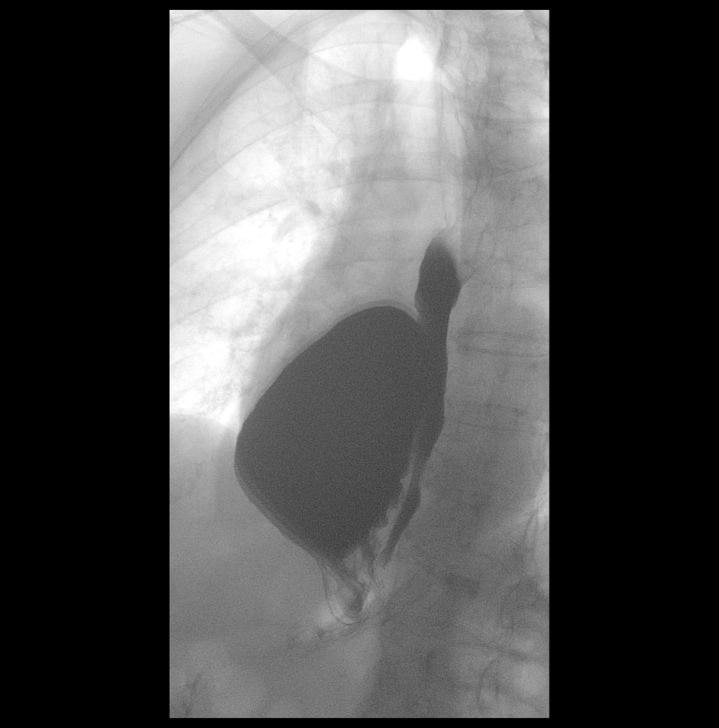

[Series 11: cp_standard · 0.54mm/px · 2 of 31 frames shown (6 of 8)]
[frame 16/31]
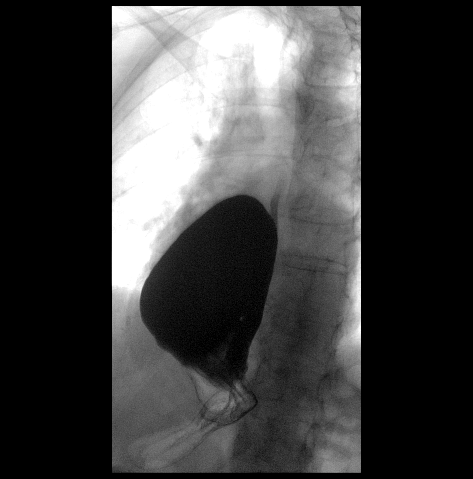
[frame 17/31]
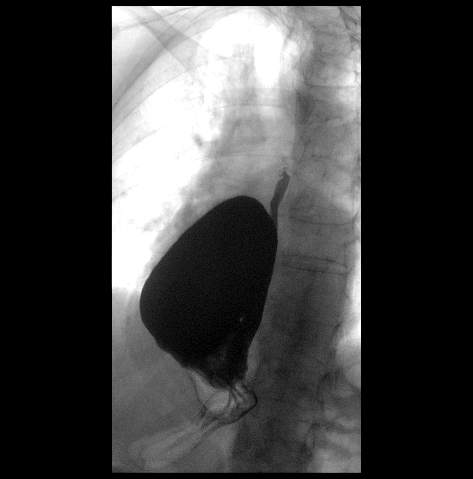

[cp_standard (7 of 8)]
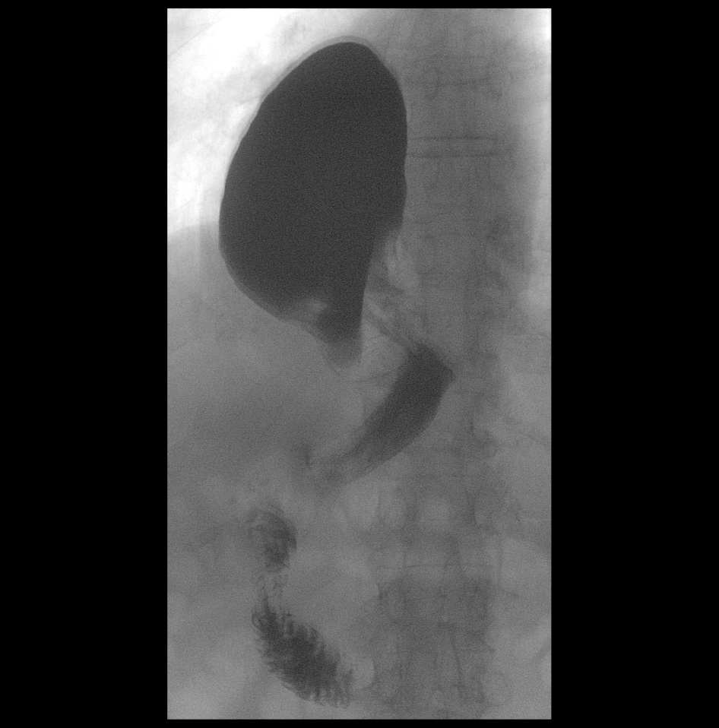

[cp_standard (8 of 8)]
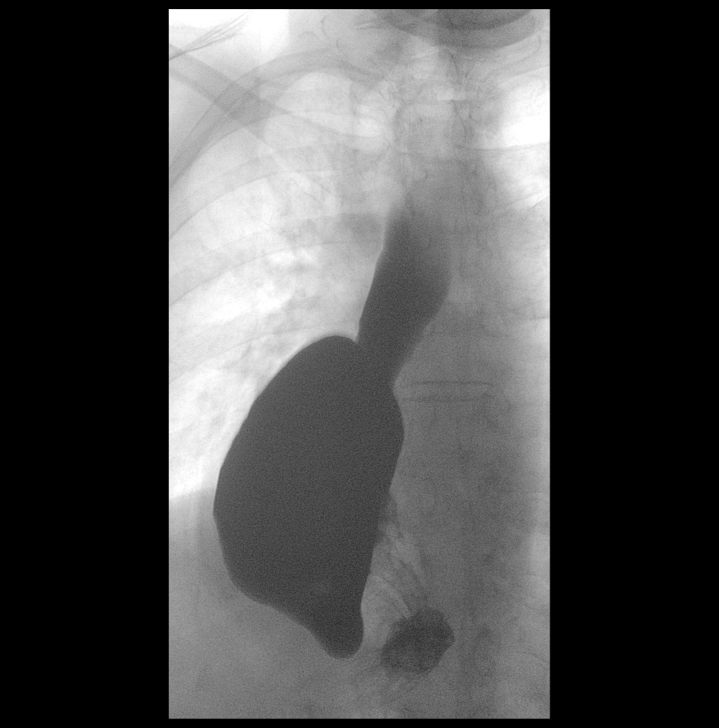

[14 of 24 positions shown; findings below may reference images not displayed]

FINDINGS: Scout film of the abdomen shows a nonobstructive bowel gas pattern.
No free air.

Fluoroscopic evaluation of swallowing demonstrates disruption of
primary esophageal peristaltic waves. No fixed esophageal stricture
or fold thickening.

There is a large paraesophageal type hiatal hernia with much of the
stomach in the right lower chest. The distal stomach is within the
right upper quadrant the abdomen. No ulceration or mass. Duodenal
bulb and proximal duodenum unremarkable.

Gastroesophageal reflux noted into the upper thoracic esophagus.
IMPRESSION: Large paraesophageal hiatal hernia with much of the stomach in the
right lower chest.

Spontaneous gastroesophageal reflux into the upper thoracic
esophagus.

## 2015-10-13 MED ORDER — PANTOPRAZOLE SODIUM 40 MG PO TBEC: 40.0000 mg | DELAYED_RELEASE_TABLET | Freq: Every day | ORAL | Status: DC

## 2015-10-13 MED ORDER — WARFARIN SODIUM 5 MG PO TABS
5.0000 mg | ORAL_TABLET | Freq: Once | ORAL | Status: AC
  Administered 2015-10-13: 5 mg via ORAL
  Filled 2015-10-13: qty 1

## 2015-10-13 MED ORDER — AMOXICILLIN 250 MG/5ML PO SUSR
500.0000 mg | Freq: Three times a day (TID) | ORAL | Status: DC
  Administered 2015-10-13 – 2015-10-14 (×3): 500 mg via ORAL
  Filled 2015-10-13 (×5): qty 10

## 2015-10-13 MED ORDER — AMOXICILLIN 250 MG/5ML PO SUSR: 500.0000 mg | Freq: Three times a day (TID) | ORAL | Status: DC

## 2015-10-13 MED ORDER — PROMETHAZINE HCL 25 MG PO TABS: 12.5000 mg | ORAL_TABLET | Freq: Four times a day (QID) | ORAL | Status: DC | PRN

## 2015-10-13 NOTE — Discharge Summary (Addendum)
Physician Discharge Summary  Charlotte Castro MRN: [id number] DOB/AGE: [birth year] 82 y.o.  PCP: Antony Blackbird, MD   Admit date: 10/09/2015 Discharge date: 10/13/2015  Discharge Diagnoses:     Principal Problem:   Nausea & vomiting Active Problems:   Essential hypertension, benign   Atrial fibrillation with RVR (HCC)   Dehydration   Addendum:paged GI -Dr Penelope Coop for any updated recommendations  If no GI recommendations tonight will hold patients discharge   Follow-up recommendations Follow-up with PCP in 3-5 days , including all  additional recommended appointments as below Follow-up CBC, CMP in 3-5 days Patient to follow-up with gastroenterology Charlotte Horner, MD ,  Patient needs outpatient referral for general surgery for Large paraesophageal hiatal hernia     Current Discharge Medication List    START taking these medications   Details  amoxicillin (AMOXIL) 250 MG/5ML suspension Take 10 mLs (500 mg total) by mouth every 8 (eight) hours. Qty: 300 mL, Refills: 0    pantoprazole (PROTONIX) 40 MG tablet Take 1 tablet (40 mg total) by mouth daily. Qty: 30 tablet, Refills: 3      CONTINUE these medications which have CHANGED   Details  promethazine (PHENERGAN) 25 MG tablet Take 0.5 tablets (12.5 mg total) by mouth every 6 (six) hours as needed for nausea or vomiting. Qty: 30 tablet, Refills: 0      CONTINUE these medications which have NOT CHANGED   Details  acetaminophen (TYLENOL) 325 MG tablet Take 325 mg by mouth every 4 (four) hours as needed for headache (pain). Reported on 10/04/2015    Calcium Carbonate-Vitamin D (CALCIUM-VITAMIN D) 600-200 MG-UNIT CAPS Take 1 capsule by mouth daily. Reported on 10/04/2015    Cholecalciferol (VITAMIN D3) 1000 UNITS CAPS Take 1,000 Units by mouth daily. Reported on 10/04/2015    clonazePAM (KLONOPIN) 0.5 MG tablet Take 0.25-0.5 mg by mouth 2 (two) times daily. For tremors    levothyroxine (SYNTHROID, LEVOTHROID) 88 MCG tablet Take 88  mcg by mouth daily before breakfast.     metoprolol (LOPRESSOR) 50 MG tablet Take 1 tablet (50 mg total) by mouth 2 (two) times daily. Qty: 60 tablet, Refills: 2    Multiple Vitamin (MULTIVITAMIN WITH MINERALS) TABS tablet Take 1 tablet by mouth daily.    ondansetron (ZOFRAN) 4 MG tablet Take 1 tablet (4 mg total) by mouth every 6 (six) hours. Qty: 12 tablet, Refills: 0    OVER THE COUNTER MEDICATION Place 1 drop into both eyes daily as needed (dry eyes). Over the counter lubricating eye drop    warfarin (COUMADIN) 5 MG tablet Take 1 tablet (5 mg total) by mouth daily. Qty: 30 tablet, Refills: 3    furosemide (LASIX) 20 MG tablet Take 1 tablet (20 mg total) by mouth as directed. TAKE 20 MG 2 DAYS A WEEK Qty: 30 tablet, Refills: 2   Associated Diagnoses: Chronic atrial fibrillation ([place]); Shortness of breath      STOP taking these medications     oxyCODONE-acetaminophen (PERCOCET/ROXICET) 5-325 MG tablet          Discharge Condition:Stable   Discharge Instructions Get Medicines reviewed and adjusted: Please take all your medications with you for your next visit with your Primary MD  Please request your Primary MD to go over all hospital tests and procedure/radiological results at the follow up, please ask your Primary MD to get all Hospital records sent to his/her office.  If you experience worsening of your admission symptoms, develop shortness of breath, life threatening emergency,  suicidal or homicidal thoughts you must seek medical attention immediately by calling 911 or calling your MD immediately if symptoms less severe.  You must read complete instructions/literature along with all the possible adverse reactions/side effects for all the Medicines you take and that have been prescribed to you. Take any new Medicines after you have completely understood and accpet all the possible adverse reactions/side effects.   Do not drive when taking Pain medications.   Do not  take more than prescribed Pain, Sleep and Anxiety Medications  Special Instructions: If you have smoked or chewed Tobacco in the last 2 yrs please stop smoking, stop any regular Alcohol and or any Recreational drug use.  Wear Seat belts while driving.  Please note  You were cared for by a hospitalist during your hospital stay. Once you are discharged, your primary care physician will handle any further medical issues. Please note that NO REFILLS for any discharge medications will be authorized once you are discharged, as it is imperative that you return to your primary care physician (or establish a relationship with a primary care physician if you do not have one) for your aftercare needs so that they can reassess your need for medications and monitor your lab values.     Allergies  Allergen Reactions  . Iodinated Diagnostic Agents Shortness Of Breath    headache      Disposition: 01-Home or Self Care   Consults:  Gastroenterology   Significant Diagnostic Studies:  Ct Abdomen Pelvis Wo Contrast  10/10/2015  CLINICAL DATA:  Intractable nausea and vomiting. Epigastric tenderness. EXAM: CT ABDOMEN AND PELVIS WITHOUT CONTRAST TECHNIQUE: Multidetector CT imaging of the abdomen and pelvis was performed following the standard protocol without IV contrast. COMPARISON:  Ultrasound 10/09/2015 FINDINGS: Lower chest: Large hiatal hernia in the right lower chest. Heart is normal size. Right basilar atelectasis. No confluent opacity on the left. No effusions. Hepatobiliary: No focal hepatic abnormality. Gallbladder unremarkable. Pancreas: No focal abnormality or ductal dilatation. Spleen: No focal abnormality.  Normal size. Adrenals/Urinary Tract: No adrenal abnormality. No focal renal abnormality. No stones or hydronephrosis. Urinary bladder is unremarkable. Stomach/Bowel: Stomach, large and small bowel grossly unremarkable. Vascular/Lymphatic: No evidence of aneurysm or adenopathy. Aortic  calcifications. Reproductive: Uterus and adnexa unremarkable.  No mass. Other: No free fluid or free air. Musculoskeletal: No acute bony abnormality or focal bone lesion. Postoperative changes in the lower lumbar spine. IMPRESSION: No acute findings in the abdomen or pelvis. Large hiatal hernia.  Adjacent right base atelectasis. Aortic atherosclerosis. Electronically Signed   By: Rolm Baptise M.D.   On: 10/10/2015 11:37   Dg Chest 2 View  10/09/2015  CLINICAL DATA:  Chest pain EXAM: CHEST  2 VIEW COMPARISON:  10/04/2015 FINDINGS: COPD with hyperinflation of the lungs. Elevated right hemidiaphragm. Very large hiatal hernia with air-fluid level. Lungs are clear without infiltrate or effusion. Negative for heart failure. IMPRESSION: COPD without acute cardiopulmonary abnormality Large hiatal hernia. Electronically Signed   By: Franchot Gallo M.D.   On: 10/09/2015 13:50   Dg Chest 2 View  10/04/2015  CLINICAL DATA:  Weakness for 2 days EXAM: CHEST  2 VIEW COMPARISON:  08/07/2012 FINDINGS: Normal cardiac silhouette. Large hiatal hernia posterior the heart. There is peripheral linear interstitial pattern similar to comparison exam. No focal infiltrate. No pneumothorax. IMPRESSION: 1. Interstitial edema. 2. Large hiatal hernia. Electronically Signed   By: Suzy Bouchard M.D.   On: 10/04/2015 13:48   US Abdomen Complete  10/10/2015  CLINICAL DATA:  Chronic nausea and vomiting.  Initial encounter. EXAM: ABDOMEN ULTRASOUND COMPLETE COMPARISON:  None. FINDINGS: Gallbladder: No gallstones or wall thickening visualized. No sonographic Murphy sign noted by sonographer. Common bile duct: Diameter: 0.3 cm, within normal limits in caliber. Liver: No focal lesion identified. Mildly increased parenchymal echogenicity, likely reflecting fatty infiltration. IVC: No abnormality visualized. Pancreas: Visualized portion unremarkable. Spleen: Size and appearance within normal limits. Right Kidney: Length: 10.3 cm. Echogenicity  within normal limits. No mass or hydronephrosis visualized. Left Kidney: Length: 10.3 cm. Echogenicity within normal limits. No mass or hydronephrosis visualized. Abdominal aorta: No aneurysm visualized. Mild calcification is noted along the abdominal aorta. Other findings: None. IMPRESSION: 1. No acute abnormality seen within the abdomen. 2. Mild calcification along the abdominal aorta. 3. Mildly fatty infiltration within the liver. Electronically Signed   By: Garald Balding M.D.   On: 10/10/2015 00:14   Nm Hepato W/eject Fract  10/12/2015  CLINICAL DATA:  82 year old female with history of abdominal pain and nausea for the past several weeks. EXAM: NUCLEAR MEDICINE HEPATOBILIARY IMAGING WITH GALLBLADDER EF TECHNIQUE: Sequential images of the abdomen were obtained out to 60 minutes following intravenous administration of radiopharmaceutical. After oral ingestion of Ensure, gallbladder ejection fraction was determined. At 60 min, normal ejection fraction is greater than 33%. RADIOPHARMACEUTICALS:  5.1 mCi Tc-25m Choletec IV COMPARISON:  No priors. FINDINGS: Prompt uptake and biliary excretion of activity by the liver is seen. Gallbladder activity is visualized, consistent with patency of cystic duct. Biliary activity passes into small bowel, consistent with patent common bile duct. Calculated gallbladder ejection fraction is 94%. (Normal gallbladder ejection fraction with Ensure is greater than 33%.) IMPRESSION: 1. Normal study.  No evidence of acute cholecystitis. Electronically Signed   By: DVinnie LangtonM.D.   On: 10/12/2015 14:12        Filed Weights   10/09/15 2157 10/11/15 2052 10/12/15 1933  Weight: 100.4 kg (221 lb 5.5 oz) 98.431 kg (217 lb) 98.93 kg (218 lb 1.6 oz)     Microbiology: Recent Results (from the past 240 hour(s))  MRSA PCR Screening     Status: None   Collection Time: 10/09/15 10:36 PM  Result Value Ref Range Status   MRSA by PCR NEGATIVE NEGATIVE Final    Comment:         The GeneXpert MRSA Assay (FDA approved for NASAL specimens only), is one component of a comprehensive MRSA colonization surveillance program. It is not intended to diagnose MRSA infection nor to guide or monitor treatment for MRSA infections.   Urine culture     Status: Abnormal (Preliminary result)   Collection Time: 10/11/15 11:55 AM  Result Value Ref Range Status   Specimen Description URINE, RANDOM  Final   Special Requests NONE  Final   Culture >=100,000 COLONIES/mL ENTEROCOCCUS SPECIES (A)  Final   Report Status PENDING  Incomplete       Blood Culture    Component Value Date/Time   SDES URINE, RANDOM 10/11/2015 1155   SPECREQUEST NONE 10/11/2015 1155   CULT >=100,000 COLONIES/mL ENTEROCOCCUS SPECIES* 10/11/2015 1155   REPTSTATUS PENDING 10/11/2015 1155      Labs: Results for orders placed or performed during the hospital encounter of 10/09/15 (from the past 48 hour(s))  Urine culture     Status: Abnormal (Preliminary result)   Collection Time: 10/11/15 11:55 AM  Result Value Ref Range   Specimen Description URINE, RANDOM    Special Requests NONE    Culture >=100,000 COLONIES/mL ENTEROCOCCUS SPECIES (  A)    Report Status PENDING   Protime-INR     Status: Abnormal   Collection Time: 10/12/15  6:16 AM  Result Value Ref Range   Prothrombin Time 31.6 (H) 11.6 - 15.2 seconds   INR 3.13 (H) 0.00 - 1.49  CBC     Status: None   Collection Time: 10/12/15  6:16 AM  Result Value Ref Range   WBC 7.3 4.0 - 10.5 K/uL   RBC 4.54 3.87 - 5.11 MIL/uL   Hemoglobin 14.9 12.0 - 15.0 g/dL    Comment: DELTA CHECK NOTED REPEATED TO VERIFY    HCT 43.5 36.0 - 46.0 %   MCV 95.8 78.0 - 100.0 fL   MCH 32.8 26.0 - 34.0 pg   MCHC 34.3 30.0 - 36.0 g/dL   RDW 13.7 11.5 - 15.5 %   Platelets 179 150 - 400 K/uL  Comprehensive metabolic panel     Status: Abnormal   Collection Time: 10/12/15  6:16 AM  Result Value Ref Range   Sodium 142 135 - 145 mmol/L   Potassium 4.2 3.5 - 5.1  mmol/L   Chloride 109 101 - 111 mmol/L   CO2 26 22 - 32 mmol/L   Glucose, Bld 92 65 - 99 mg/dL   BUN 8 6 - 20 mg/dL   Creatinine, Ser 0.92 0.44 - 1.00 mg/dL   Calcium 9.2 8.9 - 10.3 mg/dL   Total Protein 6.1 (L) 6.5 - 8.1 g/dL   Albumin 3.1 (L) 3.5 - 5.0 g/dL   AST 19 15 - 41 U/L   ALT 13 (L) 14 - 54 U/L   Alkaline Phosphatase 73 38 - 126 U/L   Total Bilirubin 0.8 0.3 - 1.2 mg/dL   GFR calc non Af Amer 57 (L) >60 mL/min   GFR calc Af Amer >60 >60 mL/min    Comment: (NOTE) The eGFR has been calculated using the CKD EPI equation. This calculation has not been validated in all clinical situations. eGFR's persistently <60 mL/min signify possible Chronic Kidney Disease.    Anion gap 7 5 - 15  Protime-INR     Status: Abnormal   Collection Time: 10/13/15  5:39 AM  Result Value Ref Range   Prothrombin Time 30.0 (H) 11.6 - 15.2 seconds   INR 2.92 (H) 0.00 - 1.49  CBC     Status: Abnormal   Collection Time: 10/13/15  5:39 AM  Result Value Ref Range   WBC 8.1 4.0 - 10.5 K/uL   RBC 4.65 3.87 - 5.11 MIL/uL   Hemoglobin 15.6 (H) 12.0 - 15.0 g/dL   HCT 44.5 36.0 - 46.0 %   MCV 95.7 78.0 - 100.0 fL   MCH 33.5 26.0 - 34.0 pg   MCHC 35.1 30.0 - 36.0 g/dL   RDW 13.5 11.5 - 15.5 %   Platelets 190 150 - 400 K/uL  Comprehensive metabolic panel     Status: Abnormal   Collection Time: 10/13/15  5:39 AM  Result Value Ref Range   Sodium 141 135 - 145 mmol/L   Potassium 4.3 3.5 - 5.1 mmol/L   Chloride 106 101 - 111 mmol/L   CO2 26 22 - 32 mmol/L   Glucose, Bld 93 65 - 99 mg/dL   BUN 14 6 - 20 mg/dL   Creatinine, Ser 0.92 0.44 - 1.00 mg/dL   Calcium 9.3 8.9 - 10.3 mg/dL   Total Protein 6.1 (L) 6.5 - 8.1 g/dL   Albumin 3.2 (L) 3.5 - 5.0 g/dL  AST 18 15 - 41 U/L   ALT 13 (L) 14 - 54 U/L   Alkaline Phosphatase 72 38 - 126 U/L   Total Bilirubin 0.9 0.3 - 1.2 mg/dL   GFR calc non Af Amer 57 (L) >60 mL/min   GFR calc Af Amer >60 >60 mL/min    Comment: (NOTE) The eGFR has been calculated  using the CKD EPI equation. This calculation has not been validated in all clinical situations. eGFR's persistently <60 mL/min signify possible Chronic Kidney Disease.    Anion gap 9 5 - 15     Lipid Panel  No results found for: CHOL, TRIG, HDL, CHOLHDL, VLDL, LDLCALC, LDLDIRECT   No results found for: HGBA1C   Lab Results  Component Value Date   CREATININE 0.92 10/13/2015     SERENIDY WALTZ is a 82 y.o. female history of chronic atrial fibrillation, hypothyroidism, hypertension presents to the ER because of persistent nausea with vomiting. Patient states she has been having these symptoms over the last 1 month and has epigastric discomfort along with it. Patient's symptoms are mostly worse in the morning. Patient states having oat meal helps her. During her recent urgent care visit patient did state that area but really denies any diarrhea. In the ER patient also was found to be tachycardic with A. fib with RVR. Patient not sure if she had taken her metoprolol. Patient will be admitted for further management of her nausea vomiting and A. fib with RVR. Patient also had some brief episode chest pain 2-3 days ago but denies any chest pain at this time  Assessment and plan  Nausea vomiting with epigastric discomfort - Differentials include peptic ulcer disease versus gallbladder pathology vs gastroesophageal reflux. Abdominal ultrasound shows fatty liver but otherwise no other pathology.NM scan does not show acute cholecystitis.  . CT scan of the abdomen and pelvis negative. Charlotte Castro gastroenterology consulted and recommended an upper GI series. Symptoms improved after starting on a PPI. Do not suspect cardiac issues. Cardiac enzymes negative. . As per cardiology notes in 2012 cardiac cath was unremarkable. Appreciate [facility] input for further recommendations. Negative  HIDA scan , no  biliary dyskinesia. Patient may need long-term PPI therapy and follow-up with gastroenterology and general  surgery for the patient's paraesophageal hernia in the outpatient setting   A. fib with RVR - rate controlled on metoprolol. Patient was started on Cardizem infusion in the ER by the ER physician. Off Cardizem drip, continue metoprolol  . Patient previously was not tolerant to amiodarone as per the cardiology notes. Patient's chads 2 vasc score is 4. Patient is on Coumadin per pharmacy.  Hypertension - on metoprolol.  Chronic kidney disease stage II - creatinine appears to be at baseline.  Possible UTI on ceftriaxone 3 days, which was discontinued, urine cultures are positive for enterococcus, sensitivity pending, patient has been switched to amoxicillin for another 3 days  Hypothyroidism on Synthroid.     Discharge Exam:    Blood pressure 145/67, pulse 78, temperature 97.8 F (36.6 C), temperature source Oral, resp. rate 20, height 5' 8"  (1.727 m), weight 98.93 kg (218 lb 1.6 oz), SpO2 99 %.  General exam: Appears calm and comfortable  Respiratory system: Clear to auscultation. Respiratory effort normal. Cardiovascular system: S1 & S2 heard, RRR. No JVD, murmurs, rubs, gallops or clicks. No pedal edema. Gastrointestinal system: Abdomen is nondistended, soft and nontender. No organomegaly or masses felt. Normal bowel sounds heard. Central nervous system: Alert and oriented. No focal neurological  deficits. Extremities: Symmetric 5 x 5 power. Skin: No rashes, lesions or ulcers Psychiatry: Judgement and insight appear normal. Mood & affect appropriate.      Follow-up Information    Follow up with FULP, CAMMIE, MD. Schedule an appointment as soon as possible for a visit in 3 days.   Specialty:  Family Medicine   Contact information:   [address] [address] [phone]       Follow up with Cassell Clement, MD. Schedule an appointment as soon as possible for a visit in 1 week.   Specialty:  Gastroenterology   Why:  Hospital follow-up for nausea   Contact  information:   [address] [place] [address] [phone]       Signed: Reyne Dumas 10/13/2015, 8:57 AM        Time spent >45 mins

## 2015-10-13 NOTE — Consult Note (Signed)
   [facility] CM Inpatient Consult   10/13/2015  Charlotte Castro 12/30/1932 [id number] Patient was screened for [facility] Care management services.    Met with the patient regarding the benefits of [facility] Care Management services  .Explained that [facility] Management is a covered benefit of her Medicare insurance. Review information for [facility] Management and a brochure was provided with contact information.  Explained that [facility] Management does not interfere with or replace any services arranged by the inpatient care management staff.  Patient declined services with [facility] Management.  Patient states, "I don't think I need that right now but, I will keep the brochure."  Encouraged patient to call if her needs changes.  For questions, please contact:  , RN BSN CCM [facility] Liaison  [phone] business mobile phone Toll free office [phone]   

## 2015-10-13 NOTE — Consult Note (Signed)
ANTICOAGULATION CONSULT NOTE - Follow up [place] for Coumadin Indication: atrial fibrillation  Allergies  Allergen Reactions  . Iodinated Diagnostic Agents Shortness Of Breath    headache    Vital Signs: Temp: 97.9 F (36.6 C) (05/01 0932) Temp Source: Oral (05/01 0932) BP: 110/79 mmHg (05/01 0932) Pulse Rate: 80 (05/01 0932)  Labs:  Recent Labs  10/10/15 1030  10/11/15 0409 10/12/15 0616 10/13/15 0539  HGB  --   < > 11.0* 14.9 15.6*  HCT  --   --  33.4* 43.5 44.5  PLT  --   --  153 179 190  LABPROT  --   --  31.3* 31.6* 30.0*  INR  --   --  3.09* 3.13* 2.92*  CREATININE  --   --  0.83 0.92 0.92  TROPONINI <0.03  --   --   --   --   < > = values in this interval not displayed.  Estimated Creatinine Clearance: 60 mL/min (by C-G formula based on Cr of 0.92).  Assessment: 82yof on coumadin pta for afib, admitted with abdominal pain. Coumadin continued. INR therapeutic at 2.92. Abdominal US and CT abdomen negative. HIDA scan showed large paraesophageal hiatal hernia. May need to hold coumadin if GI wants any procedures done.  Home dose: 5mg  daily except 2.5mg  on Thursday - last taken 4/26  Goal of Therapy:  INR 2-3 Monitor platelets by anticoagulation protocol: Yes   Plan:  1) Coumadin 5 mg x 1 2) Daily INR 3) Follow up GI recs  Charlotte Castro 10/13/2015,10:11 AM

## 2015-10-13 NOTE — Evaluation (Signed)
Physical Therapy Evaluation Patient Details Name: Charlotte Castro MRN: [id number] DOB: 12-30-1932 Today's Date: 10/13/2015   History of Present Illness  Pt is an 82 y.o. female history of chronic atrial fibrillation, hypothyroidism, hypertension who presented to the ER because of persistent nausea with vomiting.   Clinical Impression  O2 sats 99% during ambulation. Pt is at baseline level of function. Supervision provided for safety only. Pt is active at home and has good family support. She demo mild strength/activity tolerance deficits that should quickly return when d/c'd home to her regular routine. PT signing off.    Follow Up Recommendations No PT follow up;Supervision - Intermittent    Equipment Recommendations  None recommended by PT    Recommendations for Other Services       Precautions / Restrictions Precautions Precautions: None      Mobility  Bed Mobility Overal bed mobility: Modified Independent             General bed mobility comments: increased time needed  Transfers Overall transfer level: Needs assistance Equipment used: Rolling walker (2 wheeled) Transfers: Sit to/from Stand Sit to Stand: Supervision         General transfer comment: supervision for safety only  Ambulation/Gait Ambulation/Gait assistance: Supervision Ambulation Distance (Feet): 150 Feet Assistive device: Rolling walker (2 wheeled) Gait Pattern/deviations: Step-through pattern;Decreased stride length Gait velocity: mildly decreased   General Gait Details: steady gait, no LOB, supervision for safety only  Stairs            Wheelchair Mobility    Modified Rankin (Stroke Patients Only)       Balance                                             Pertinent Vitals/Pain Pain Assessment: No/denies pain    Home Living Family/patient expects to be discharged to:: Private residence Living Arrangements: Alone Available Help at Discharge:  Family;Available PRN/intermittently Type of Home: House Home Access: Ramped entrance     Home Layout: One level Home Equipment: Walker - 2 wheels;Walker - 4 wheels;Cane - single point;Bedside commode;Wheelchair - manual;Shower seat      Prior Function Level of Independence: Independent with assistive device(s)         Comments: Pt uses cane inside house and RW outside of house     Hand Dominance        Extremity/Trunk Assessment   Upper Extremity Assessment: Overall WFL for tasks assessed           Lower Extremity Assessment: Overall WFL for tasks assessed      Cervical / Trunk Assessment: Kyphotic  Communication   Communication: No difficulties  Cognition Arousal/Alertness: Awake/alert Behavior During Therapy: WFL for tasks assessed/performed Overall Cognitive Status: Within Functional Limits for tasks assessed                      General Comments      Exercises        Assessment/Plan    PT Assessment Patent does not need any further PT services  PT Diagnosis Difficulty walking   PT Problem List    PT Treatment Interventions     PT Goals (Current goals can be found in the Care Plan section) Acute Rehab PT Goals Patient Stated Goal: home PT Goal Formulation: All assessment and education complete, DC therapy    Frequency  Barriers to discharge        Co-evaluation               End of Session Equipment Utilized During Treatment: Gait belt Activity Tolerance: Patient tolerated treatment well Patient left: in bed;with call bell/phone within reach;with bed alarm set Nurse Communication: Mobility status         Time: 1054-1110 PT Time Calculation (min) (ACUTE ONLY): 16 min   Charges:   PT Evaluation $PT Eval Moderate Complexity: 1 Procedure     PT G Codes:        Charlotte Castro 10/13/2015, 11:17 AM

## 2015-10-14 LAB — CBC
HCT: 43 % (ref 36.0–46.0)
HEMOGLOBIN: 14.4 g/dL (ref 12.0–15.0)
MCH: 32.1 pg (ref 26.0–34.0)
MCHC: 33.5 g/dL (ref 30.0–36.0)
MCV: 95.8 fL (ref 78.0–100.0)
Platelets: 191 10*3/uL (ref 150–400)
RBC: 4.49 MIL/uL (ref 3.87–5.11)
RDW: 13.5 % (ref 11.5–15.5)
WBC: 7.3 10*3/uL (ref 4.0–10.5)

## 2015-10-14 LAB — COMPREHENSIVE METABOLIC PANEL
ALK PHOS: 76 U/L (ref 38–126)
ALT: 13 U/L — ABNORMAL LOW (ref 14–54)
ANION GAP: 9 (ref 5–15)
AST: 17 U/L (ref 15–41)
Albumin: 3.2 g/dL — ABNORMAL LOW (ref 3.5–5.0)
BUN: 20 mg/dL (ref 6–20)
CALCIUM: 9.2 mg/dL (ref 8.9–10.3)
CO2: 26 mmol/L (ref 22–32)
CREATININE: 1.07 mg/dL — AB (ref 0.44–1.00)
Chloride: 105 mmol/L (ref 101–111)
GFR, EST AFRICAN AMERICAN: 55 mL/min — AB (ref >60)
GFR, EST NON AFRICAN AMERICAN: 48 mL/min — AB (ref >60)
Glucose, Bld: 94 mg/dL (ref 65–99)
Potassium: 4.2 mmol/L (ref 3.5–5.1)
SODIUM: 140 mmol/L (ref 135–145)
TOTAL PROTEIN: 6.3 g/dL — AB (ref 6.5–8.1)
Total Bilirubin: 0.8 mg/dL (ref 0.3–1.2)

## 2015-10-14 LAB — PROTIME-INR
INR: 2.84 — ABNORMAL HIGH (ref 0.00–1.49)
PROTHROMBIN TIME: 29.4 s — AB (ref 11.6–15.2)

## 2015-10-14 MED ORDER — WARFARIN SODIUM 5 MG PO TABS: 2.5000 mg | ORAL_TABLET | Freq: Every day | ORAL | Status: DC

## 2015-10-14 MED ORDER — PROMETHAZINE HCL 12.5 MG PO TABS: 12.5000 mg | ORAL_TABLET | Freq: Four times a day (QID) | ORAL | Status: DC | PRN

## 2015-10-14 NOTE — Discharge Instructions (Addendum)
Information on my medicine - Coumadin   (Warfarin)   Why was Coumadin prescribed for you? Coumadin was prescribed for you because you have a blood clot or a medical condition that can cause an increased risk of forming blood clots. Blood clots can cause serious health problems by blocking the flow of blood to the heart, lung, or brain. Coumadin can prevent harmful blood clots from forming. As a reminder your indication for Coumadin is:   Stroke Prevention Because Of Atrial Fibrillation  What test will check on my response to Coumadin? While on Coumadin (warfarin) you will need to have an INR test regularly to ensure that your dose is keeping you in the desired range. The INR (international normalized ratio) number is calculated from the result of the laboratory test called prothrombin time (PT).  If an INR APPOINTMENT HAS NOT ALREADY BEEN MADE FOR YOU please schedule an appointment to have this lab work done by your health care provider within 7 days. Your INR goal is usually a number between:  2 to 3 or your provider may give you a more narrow range like 2-2.5.  Ask your health care provider during an office visit what your goal INR is.  What  do you need to  know  About  COUMADIN? Take Coumadin (warfarin) exactly as prescribed by your healthcare provider about the same time each day.  DO NOT stop taking without talking to the doctor who prescribed the medication.  Stopping without other blood clot prevention medication to take the place of Coumadin may increase your risk of developing a new clot or stroke.  Get refills before you run out.  What do you do if you miss a dose? If you miss a dose, take it as soon as you remember on the same day then continue your regularly scheduled regimen the next day.  Do not take two doses of Coumadin at the same time.  Important Safety Information A possible side effect of Coumadin (Warfarin) is an increased risk of bleeding. You should call your healthcare  provider right away if you experience any of the following: ? Bleeding from an injury or your nose that does not stop. ? Unusual colored urine (red or dark brown) or unusual colored stools (red or black). ? Unusual bruising for unknown reasons. ? A serious fall or if you hit your head (even if there is no bleeding).  Some foods or medicines interact with Coumadin (warfarin) and might alter your response to warfarin. To help avoid this: ? Eat a balanced diet, maintaining a consistent amount of Vitamin K. ? Notify your provider about major diet changes you plan to make. ? Avoid alcohol or limit your intake to 1 drink for women and 2 drinks for men per day. (1 drink is 5 oz. wine, 12 oz. beer, or 1.5 oz. liquor.)  Make sure that ANY health care provider who prescribes medication for you knows that you are taking Coumadin (warfarin).  Also make sure the healthcare provider who is monitoring your Coumadin knows when you have started a new medication including herbals and non-prescription products.  Coumadin (Warfarin)  Major Drug Interactions  Increased Warfarin Effect Decreased Warfarin Effect  Alcohol (large quantities) Antibiotics (esp. Septra/Bactrim, Flagyl, Cipro) Amiodarone (Cordarone) Aspirin (ASA) Cimetidine (Tagamet) Megestrol (Megace) NSAIDs (ibuprofen, naproxen, etc.) Piroxicam (Feldene) Propafenone (Rythmol SR) Propranolol (Inderal) Isoniazid (INH) Posaconazole (Noxafil) Barbiturates (Phenobarbital) Carbamazepine (Tegretol) Chlordiazepoxide (Librium) Cholestyramine (Questran) Griseofulvin Oral Contraceptives Rifampin Sucralfate (Carafate) Vitamin K   Coumadin (Warfarin) Major  Herbal Interactions  Increased Warfarin Effect Decreased Warfarin Effect  Garlic Ginseng Ginkgo biloba Coenzyme Q10 Green tea St. Johns wort    Coumadin (Warfarin) FOOD Interactions  Eat a consistent number of servings per week of foods HIGH in Vitamin K (1 serving =  cup)  Collards  (cooked, or boiled & drained) Kale (cooked, or boiled & drained) Mustard greens (cooked, or boiled & drained) Parsley *serving size only =  cup Spinach (cooked, or boiled & drained) Swiss chard (cooked, or boiled & drained) Turnip greens (cooked, or boiled & drained)  Eat a consistent number of servings per week of foods MEDIUM-HIGH in Vitamin K (1 serving = 1 cup)  Asparagus (cooked, or boiled & drained) Broccoli (cooked, boiled & drained, or raw & chopped) Brussel sprouts (cooked, or boiled & drained) *serving size only =  cup Lettuce, raw (green leaf, endive, romaine) Spinach, raw Turnip greens, raw & chopped   These websites have more information on Coumadin (warfarin):  FailFactory.se; VeganReport.com.au;    Atrial Fibrillation Atrial fibrillation is a type of irregular or rapid heartbeat (arrhythmia). In atrial fibrillation, the heart quivers continuously in a chaotic pattern. This occurs when parts of the heart receive disorganized signals that make the heart unable to pump blood normally. This can increase the risk for stroke, heart failure, and other heart-related conditions. There are different types of atrial fibrillation, including:  Paroxysmal atrial fibrillation. This type starts suddenly, and it usually stops on its own shortly after it starts.  Persistent atrial fibrillation. This type often lasts longer than a week. It may stop on its own or with treatment.  Long-lasting persistent atrial fibrillation. This type lasts longer than 12 months.  Permanent atrial fibrillation. This type does not go away. Talk with your health care provider to learn about the type of atrial fibrillation that you have. CAUSES This condition is caused by some heart-related conditions or procedures, including:  A heart attack.  Coronary artery disease.  Heart failure.  Heart valve conditions.  High blood pressure.  Inflammation of the sac that surrounds the  heart (pericarditis).  Heart surgery.  Certain heart rhythm disorders, such as Wolf-Parkinson-White syndrome. Other causes include:  Pneumonia.  Obstructive sleep apnea.  Blockage of an artery in the lungs (pulmonary embolism, or PE).  Lung cancer.  Chronic lung disease.  Thyroid problems, especially if the thyroid is overactive (hyperthyroidism).  Caffeine.  Excessive alcohol use or illegal drug use.  Use of some medicines, including certain decongestants and diet pills. Sometimes, the cause cannot be found. RISK FACTORS This condition is more likely to develop in:  People who are older in age.  People who smoke.  People who have diabetes mellitus.  People who are overweight (obese).  Athletes who exercise vigorously. SYMPTOMS Symptoms of this condition include:  A feeling that your heart is beating rapidly or irregularly.  A feeling of discomfort or pain in your chest.  Shortness of breath.  Sudden light-headedness or weakness.  Getting tired easily during exercise. In some cases, there are no symptoms. DIAGNOSIS Your health care provider may be able to detect atrial fibrillation when taking your pulse. If detected, this condition may be diagnosed with:  An electrocardiogram (ECG).  A Holter monitor test that records your heartbeat patterns over a 24-hour period.  Transthoracic echocardiogram (TTE) to evaluate how blood flows through your heart.  Transesophageal echocardiogram (TEE) to view more detailed images of your heart.  A stress test.  Imaging tests, such as a CT  scan or chest X-ray.  Blood tests. TREATMENT The main goals of treatment are to prevent blood clots from forming and to keep your heart beating at a normal rate and rhythm. The type of treatment that you receive depends on many factors, such as your underlying medical conditions and how you feel when you are experiencing atrial fibrillation. This condition may be treated  with:  Medicine to slow down the heart rate, bring the heart's rhythm back to normal, or prevent clots from forming.  Electrical cardioversion. This is a procedure that resets your heart's rhythm by delivering a controlled, low-energy shock to the heart through your skin.  Different types of ablation, such as catheter ablation, catheter ablation with pacemaker, or surgical ablation. These procedures destroy the heart tissues that send abnormal signals. When the pacemaker is used, it is placed under your skin to help your heart beat in a regular rhythm. HOME CARE INSTRUCTIONS  Take over-the counter and prescription medicines only as told by your health care provider.  If your health care provider prescribed a blood-thinning medicine (anticoagulant), take it exactly as told. Taking too much blood-thinning medicine can cause bleeding. If you do not take enough blood-thinning medicine, you will not have the protection that you need against stroke and other problems.  Do not use tobacco products, including cigarettes, chewing tobacco, and e-cigarettes. If you need help quitting, ask your health care provider.  If you have obstructive sleep apnea, manage your condition as told by your health care provider.  Do not drink alcohol.  Do not drink beverages that contain caffeine, such as coffee, soda, and tea.  Maintain a healthy weight. Do not use diet pills unless your health care provider approves. Diet pills may make heart problems worse.  Follow diet instructions as told by your health care provider.  Exercise regularly as told by your health care provider.  Keep all follow-up visits as told by your health care provider. This is important. PREVENTION  Avoid drinking beverages that contain caffeine or alcohol.  Avoid certain medicines, especially medicines that are used for breathing problems.  Avoid certain herbs and herbal medicines, such as those that contain ephedra or ginseng.  Do  not use illegal drugs, such as cocaine and amphetamines.  Do not smoke.  Manage your high blood pressure. SEEK MEDICAL CARE IF:  You notice a change in the rate, rhythm, or strength of your heartbeat.  You are taking an anticoagulant and you notice increased bruising.  You tire more easily when you exercise or exert yourself. SEEK IMMEDIATE MEDICAL CARE IF:  You have chest pain, abdominal pain, sweating, or weakness.  You feel nauseous.  You notice blood in your vomit, bowel movement, or urine.  You have shortness of breath.  You suddenly have swollen feet and ankles.  You feel dizzy.  You have sudden weakness or numbness of the face, arm, or leg, especially on one side of the body.  You have trouble speaking, trouble understanding, or both (aphasia).  Your face or your eyelid droops on one side. These symptoms may represent a serious problem that is an emergency. Do not wait to see if the symptoms will go away. Get medical help right away. Call your local emergency services (911 in the [place] Do not drive yourself to the hospital.   This information is not intended to replace advice given to you by your health care provider. Make sure you discuss any questions you have with your health care provider.  Document Released: 05/31/2005 Document Revised: 02/19/2015 Document Reviewed: 09/25/2014 Elsevier Interactive Patient Education [employer].

## 2015-10-14 NOTE — Consult Note (Signed)
Reason for Consult:Hiatal hernia Referring Physician: Abigale Dorow is an 82 y.o. female.  HPI: Laterrica has multiple medical problems including afib on coumadin, HTN, hypothyroidism, and GERD and presented to the ED with a month-long history of N/V/abd pain. It was generally worse in the morning. She was able to ameliorate it somewhat by eating small amounts and softer foods and soup. She had some diarrhea but it was short-lived. She denies fevers, chills, sweats, previous episodes, or aggravating factors except eating heavy or greasy foods. She was admitted partially for RVR which is now resolved. She is feeling better now and tolerating most foods but still has some mild pain and mild nausea at times.  Past Medical History  Diagnosis Date  . Dizziness     chronic and of an unclear etiology  . Hypothyroidism   . HTN (hypertension)   . Hyperlipemia   . Obesity   . Osteoporosis   . Gastritis   . Vitamin D deficiency   . Atrial tachycardia ([place])     ablated 11/17/10  by JA  from the [facility] of the aorta  . Atrial flutter ([place])     typical appearing  . Atrial fibrillation ([place])   . GERD (gastroesophageal reflux disease)     Past Surgical History  Procedure Laterality Date  . Atrial ablation surgery  11/17/10    Atrial tachycardia arising from [facility] of the aorta ablated by JA    Family History  Problem Relation Age of Onset  . Heart attack Father   . Hypertension Father     Social History:  reports that she has never smoked. She has never used smokeless tobacco. She reports that she does not drink alcohol or use illicit drugs.  Allergies:  Allergies  Allergen Reactions  . Iodinated Diagnostic Agents Shortness Of Breath    headache    Medications: I have reviewed the patient's current medications.  Results for orders placed or performed during the hospital encounter of 10/09/15 (from the past 48 hour(s))  Protime-INR     Status: Abnormal   Collection Time: 10/13/15   5:39 AM  Result Value Ref Range   Prothrombin Time 30.0 (H) 11.6 - 15.2 seconds   INR 2.92 (H) 0.00 - 1.49  CBC     Status: Abnormal   Collection Time: 10/13/15  5:39 AM  Result Value Ref Range   WBC 8.1 4.0 - 10.5 K/uL   RBC 4.65 3.87 - 5.11 MIL/uL   Hemoglobin 15.6 (H) 12.0 - 15.0 g/dL   HCT 44.5 36.0 - 46.0 %   MCV 95.7 78.0 - 100.0 fL   MCH 33.5 26.0 - 34.0 pg   MCHC 35.1 30.0 - 36.0 g/dL   RDW 13.5 11.5 - 15.5 %   Platelets 190 150 - 400 K/uL  Comprehensive metabolic panel     Status: Abnormal   Collection Time: 10/13/15  5:39 AM  Result Value Ref Range   Sodium 141 135 - 145 mmol/L   Potassium 4.3 3.5 - 5.1 mmol/L   Chloride 106 101 - 111 mmol/L   CO2 26 22 - 32 mmol/L   Glucose, Bld 93 65 - 99 mg/dL   BUN 14 6 - 20 mg/dL   Creatinine, Ser 0.92 0.44 - 1.00 mg/dL   Calcium 9.3 8.9 - 10.3 mg/dL   Total Protein 6.1 (L) 6.5 - 8.1 g/dL   Albumin 3.2 (L) 3.5 - 5.0 g/dL   AST 18 15 - 41 U/L  ALT 13 (L) 14 - 54 U/L   Alkaline Phosphatase 72 38 - 126 U/L   Total Bilirubin 0.9 0.3 - 1.2 mg/dL   GFR calc non Af Amer 57 (L) >60 mL/min   GFR calc Af Amer >60 >60 mL/min    Comment: (NOTE) The eGFR has been calculated using the CKD EPI equation. This calculation has not been validated in all clinical situations. eGFR's persistently <60 mL/min signify possible Chronic Kidney Disease.    Anion gap 9 5 - 15  Protime-INR     Status: Abnormal   Collection Time: 10/14/15  5:35 AM  Result Value Ref Range   Prothrombin Time 29.4 (H) 11.6 - 15.2 seconds   INR 2.84 (H) 0.00 - 1.49  CBC     Status: None   Collection Time: 10/14/15  5:35 AM  Result Value Ref Range   WBC 7.3 4.0 - 10.5 K/uL   RBC 4.49 3.87 - 5.11 MIL/uL   Hemoglobin 14.4 12.0 - 15.0 g/dL   HCT 43.0 36.0 - 46.0 %   MCV 95.8 78.0 - 100.0 fL   MCH 32.1 26.0 - 34.0 pg   MCHC 33.5 30.0 - 36.0 g/dL   RDW 13.5 11.5 - 15.5 %   Platelets 191 150 - 400 K/uL  Comprehensive metabolic panel     Status: Abnormal   Collection  Time: 10/14/15  5:35 AM  Result Value Ref Range   Sodium 140 135 - 145 mmol/L   Potassium 4.2 3.5 - 5.1 mmol/L   Chloride 105 101 - 111 mmol/L   CO2 26 22 - 32 mmol/L   Glucose, Bld 94 65 - 99 mg/dL   BUN 20 6 - 20 mg/dL   Creatinine, Ser 1.07 (H) 0.44 - 1.00 mg/dL   Calcium 9.2 8.9 - 10.3 mg/dL   Total Protein 6.3 (L) 6.5 - 8.1 g/dL   Albumin 3.2 (L) 3.5 - 5.0 g/dL   AST 17 15 - 41 U/L   ALT 13 (L) 14 - 54 U/L   Alkaline Phosphatase 76 38 - 126 U/L   Total Bilirubin 0.8 0.3 - 1.2 mg/dL   GFR calc non Af Amer 48 (L) >60 mL/min   GFR calc Af Amer 55 (L) >60 mL/min    Comment: (NOTE) The eGFR has been calculated using the CKD EPI equation. This calculation has not been validated in all clinical situations. eGFR's persistently <60 mL/min signify possible Chronic Kidney Disease.    Anion gap 9 5 - 15    Nm Hepato W/eject Fract  10/12/2015  CLINICAL DATA:  82 year old female with history of abdominal pain and nausea for the past several weeks. EXAM: NUCLEAR MEDICINE HEPATOBILIARY IMAGING WITH GALLBLADDER EF TECHNIQUE: Sequential images of the abdomen were obtained out to 60 minutes following intravenous administration of radiopharmaceutical. After oral ingestion of Ensure, gallbladder ejection fraction was determined. At 60 min, normal ejection fraction is greater than 33%. RADIOPHARMACEUTICALS:  5.1 mCi Tc-83m Choletec IV COMPARISON:  No priors. FINDINGS: Prompt uptake and biliary excretion of activity by the liver is seen. Gallbladder activity is visualized, consistent with patency of cystic duct. Biliary activity passes into small bowel, consistent with patent common bile duct. Calculated gallbladder ejection fraction is 94%. (Normal gallbladder ejection fraction with Ensure is greater than 33%.) IMPRESSION: 1. Normal study.  No evidence of acute cholecystitis. Electronically Signed   By: DVinnie LangtonM.D.   On: 10/12/2015 14:12   Dg Ugi W/high Density W/kub  10/13/2015  CLINICAL  DATA:  Nausea.  Hiatal hernia. EXAM: UPPER GI SERIES WITH KUB TECHNIQUE: After obtaining a scout radiograph a routine upper GI series was performed using thin barium FLUOROSCOPY TIME:  Radiation Exposure Index (as provided by the fluoroscopic device): If the device does not provide the exposure index: Fluoroscopy Time (in minutes and seconds):  1 minutes 54 seconds Number of Acquired Images:  1 COMPARISON:  CT 10/10/2015 FINDINGS: Scout film of the abdomen shows a nonobstructive bowel gas pattern. No free air. Fluoroscopic evaluation of swallowing demonstrates disruption of primary esophageal peristaltic waves. No fixed esophageal stricture or fold thickening. There is a large paraesophageal type hiatal hernia with much of the stomach in the right lower chest. The distal stomach is within the right upper quadrant the abdomen. No ulceration or mass. Duodenal bulb and proximal duodenum unremarkable. Gastroesophageal reflux noted into the upper thoracic esophagus. IMPRESSION: Large paraesophageal hiatal hernia with much of the stomach in the right lower chest. Spontaneous gastroesophageal reflux into the upper thoracic esophagus. Electronically Signed   By: Rolm Baptise M.D.   On: 10/13/2015 09:33    Review of Systems  Constitutional: Negative for weight loss.  HENT: Negative for ear discharge, ear pain, hearing loss and tinnitus.   Eyes: Negative for blurred vision, double vision, photophobia and pain.  Respiratory: Negative for cough, sputum production and shortness of breath.   Cardiovascular: Negative for chest pain.  Gastrointestinal: Positive for heartburn, nausea, vomiting, abdominal pain and diarrhea. Negative for constipation, blood in stool and melena.  Genitourinary: Negative for dysuria, urgency, frequency and flank pain.  Musculoskeletal: Negative for myalgias, back pain, joint pain, falls and neck pain.  Neurological: Negative for dizziness, tingling, sensory change, focal weakness, loss of  consciousness and headaches.  Endo/Heme/Allergies: Does not bruise/bleed easily.  Psychiatric/Behavioral: Negative for depression, memory loss and substance abuse. The patient is not nervous/anxious.    Blood pressure 119/55, pulse 83, temperature 97.7 F (36.5 C), temperature source Oral, resp. rate 18, height _0  (1.727 m), weight 97.886 kg (215 lb 12.8 oz), SpO2 100 %. Physical Exam  Vitals reviewed. Constitutional: She appears well-developed and well-nourished. She is cooperative. No distress.  HENT:  Head: Normocephalic and atraumatic. Head is without raccoon's eyes, without Battle's sign, without abrasion, without contusion and without laceration.  Right Ear: Hearing, external ear and ear canal normal. No drainage or tenderness.  Left Ear: Hearing, external ear and ear canal normal. No drainage or tenderness.  Nose: No nose lacerations, sinus tenderness, nasal deformity or nasal septal hematoma. No epistaxis.  Mouth/Throat: Uvula is midline and mucous membranes are normal. No lacerations.  Eyes: Conjunctivae and lids are normal. Right eye exhibits no discharge. Left eye exhibits no discharge. No scleral icterus.  Neck: Trachea normal and normal range of motion. Neck supple. No JVD present. No spinous process tenderness and no muscular tenderness present. Carotid bruit is not present. No tracheal deviation present. No thyromegaly present.  Cardiovascular: Normal rate, normal heart sounds, intact distal pulses and normal pulses.  An irregularly irregular rhythm present. Exam reveals no gallop and no friction rub.   No murmur heard. Respiratory: Effort normal and breath sounds normal. No stridor. No respiratory distress. She has no wheezes. She has no rales. She exhibits no tenderness, no bony tenderness, no laceration and no crepitus.  GI: Soft. Normal appearance and bowel sounds are normal. She exhibits no distension. There is generalized tenderness. There is no rigidity, no rebound, no  guarding and no CVA tenderness.  Musculoskeletal: Normal  range of motion. She exhibits no edema or tenderness.  Lymphadenopathy:    She has no cervical adenopathy.  Neurological: She is alert. She has normal strength. No cranial nerve deficit or sensory deficit. GCS eye subscore is 4. GCS verbal subscore is 5. GCS motor subscore is 6.  Skin: Skin is warm, dry and intact. She is not diaphoretic.  Psychiatric: She has a normal mood and affect. Her speech is normal and behavior is normal.    Assessment/Plan: Paraesophageal hiatal hernia -- The patient is amenable to surgical fixation should it come to that. Contrast does exit the thoracic cavity on her UGI though she still might have an element of obstruction. GERD was also present. She would like to see if she can manage at home continuing to eat mostly soft foods. I think she can be discharged from a surgical standpoint. I have given her a card so that she can make an appt with a surgeon as I expect this will need to be fixed at some point. I did inform her that correction of her hernia may not affect her symptoms. Continue aggressive treatment of her GERD.  Thank you for allowing [place] to see this very pleasant woman.    Lisette Abu, PA-C Pager: [phone] 10/14/2015, 12:09 PM

## 2015-10-14 NOTE — Care Management Important Message (Signed)
Important Message  Patient Details  Name: Charlotte Castro MRN: [id number] Date of Birth: 09-11-1932   Medicare Important Message Given:  Yes    Itati Brocksmith P Yuepheng Schaller 10/14/2015, 1:33 PM

## 2015-10-14 NOTE — Discharge Summary (Signed)
Physician Discharge Summary  Charlotte Castro MRN: [id number] DOB/AGE: [birth year] 82 y.o.  PCP: Antony Blackbird, MD   Admit date: 10/09/2015 Discharge date: 10/14/2015  Discharge Diagnoses:     Principal Problem:   Nausea & vomiting Active Problems:   Essential hypertension, benign   Atrial fibrillation with RVR (HCC)   Dehydration    Follow-up recommendations Follow-up with PCP in 3-5 days , including all  additional recommended appointments as below Follow-up CBC, CMP in 3-5 days Patient to follow-up with gastroenterology Wonda Horner, MD ,  Patient needs outpatient referral for general surgery for Large paraesophageal hiatal hernia     Current Discharge Medication List    START taking these medications   Details  amoxicillin (AMOXIL) 250 MG/5ML suspension Take 10 mLs (500 mg total) by mouth every 8 (eight) hours. Qty: 300 mL, Refills: 0    pantoprazole (PROTONIX) 40 MG tablet Take 1 tablet (40 mg total) by mouth daily. Qty: 30 tablet, Refills: 3      CONTINUE these medications which have CHANGED   Details  promethazine (PHENERGAN) 25 MG tablet Take 0.5 tablets (12.5 mg total) by mouth every 6 (six) hours as needed for nausea or vomiting. Qty: 30 tablet, Refills: 0      CONTINUE these medications which have NOT CHANGED   Details  acetaminophen (TYLENOL) 325 MG tablet Take 325 mg by mouth every 4 (four) hours as needed for headache (pain). Reported on 10/04/2015    Calcium Carbonate-Vitamin D (CALCIUM-VITAMIN D) 600-200 MG-UNIT CAPS Take 1 capsule by mouth daily. Reported on 10/04/2015    Cholecalciferol (VITAMIN D3) 1000 UNITS CAPS Take 1,000 Units by mouth daily. Reported on 10/04/2015    clonazePAM (KLONOPIN) 0.5 MG tablet Take 0.25-0.5 mg by mouth 2 (two) times daily. For tremors    levothyroxine (SYNTHROID, LEVOTHROID) 88 MCG tablet Take 88 mcg by mouth daily before breakfast.     metoprolol (LOPRESSOR) 50 MG tablet Take 1 tablet (50 mg total) by mouth 2 (two)  times daily. Qty: 60 tablet, Refills: 2    Multiple Vitamin (MULTIVITAMIN WITH MINERALS) TABS tablet Take 1 tablet by mouth daily.    ondansetron (ZOFRAN) 4 MG tablet Take 1 tablet (4 mg total) by mouth every 6 (six) hours. Qty: 12 tablet, Refills: 0    OVER THE COUNTER MEDICATION Place 1 drop into both eyes daily as needed (dry eyes). Over the counter lubricating eye drop    warfarin (COUMADIN) 5 MG tablet Take 1 tablet (5 mg total) by mouth daily. Qty: 30 tablet, Refills: 3    furosemide (LASIX) 20 MG tablet Take 1 tablet (20 mg total) by mouth as directed. TAKE 20 MG 2 DAYS A WEEK Qty: 30 tablet, Refills: 2   Associated Diagnoses: Chronic atrial fibrillation ([place]); Shortness of breath      STOP taking these medications     oxyCODONE-acetaminophen (PERCOCET/ROXICET) 5-325 MG tablet          Discharge Condition:Stable   Discharge Instructions Get Medicines reviewed and adjusted: Please take all your medications with you for your next visit with your Primary MD  Please request your Primary MD to go over all hospital tests and procedure/radiological results at the follow up, please ask your Primary MD to get all Hospital records sent to his/her office.  If you experience worsening of your admission symptoms, develop shortness of breath, life threatening emergency, suicidal or homicidal thoughts you must seek medical attention immediately by calling 911 or calling your MD immediately if  symptoms less severe.  You must read complete instructions/literature along with all the possible adverse reactions/side effects for all the Medicines you take and that have been prescribed to you. Take any new Medicines after you have completely understood and accpet all the possible adverse reactions/side effects.   Do not drive when taking Pain medications.   Do not take more than prescribed Pain, Sleep and Anxiety Medications  Special Instructions: If you have smoked or chewed Tobacco in  the last 2 yrs please stop smoking, stop any regular Alcohol and or any Recreational drug use.  Wear Seat belts while driving.  Please note  You were cared for by a hospitalist during your hospital stay. Once you are discharged, your primary care physician will handle any further medical issues. Please note that NO REFILLS for any discharge medications will be authorized once you are discharged, as it is imperative that you return to your primary care physician (or establish a relationship with a primary care physician if you do not have one) for your aftercare needs so that they can reassess your need for medications and monitor your lab values.  Discharge Instructions    Diet - low sodium heart healthy    Complete by:  As directed      Diet - low sodium heart healthy    Complete by:  As directed      Increase activity slowly    Complete by:  As directed      Increase activity slowly    Complete by:  As directed             Allergies  Allergen Reactions  . Iodinated Diagnostic Agents Shortness Of Breath    headache      Disposition: 01-Home or Self Care   Consults:  Gastroenterology   Significant Diagnostic Studies:  Ct Abdomen Pelvis Wo Contrast  10/10/2015  CLINICAL DATA:  Intractable nausea and vomiting. Epigastric tenderness. EXAM: CT ABDOMEN AND PELVIS WITHOUT CONTRAST TECHNIQUE: Multidetector CT imaging of the abdomen and pelvis was performed following the standard protocol without IV contrast. COMPARISON:  Ultrasound 10/09/2015 FINDINGS: Lower chest: Large hiatal hernia in the right lower chest. Heart is normal size. Right basilar atelectasis. No confluent opacity on the left. No effusions. Hepatobiliary: No focal hepatic abnormality. Gallbladder unremarkable. Pancreas: No focal abnormality or ductal dilatation. Spleen: No focal abnormality.  Normal size. Adrenals/Urinary Tract: No adrenal abnormality. No focal renal abnormality. No stones or hydronephrosis. Urinary  bladder is unremarkable. Stomach/Bowel: Stomach, large and small bowel grossly unremarkable. Vascular/Lymphatic: No evidence of aneurysm or adenopathy. Aortic calcifications. Reproductive: Uterus and adnexa unremarkable.  No mass. Other: No free fluid or free air. Musculoskeletal: No acute bony abnormality or focal bone lesion. Postoperative changes in the lower lumbar spine. IMPRESSION: No acute findings in the abdomen or pelvis. Large hiatal hernia.  Adjacent right base atelectasis. Aortic atherosclerosis. Electronically Signed   By: Rolm Baptise M.D.   On: 10/10/2015 11:37   Dg Chest 2 View  10/09/2015  CLINICAL DATA:  Chest pain EXAM: CHEST  2 VIEW COMPARISON:  10/04/2015 FINDINGS: COPD with hyperinflation of the lungs. Elevated right hemidiaphragm. Very large hiatal hernia with air-fluid level. Lungs are clear without infiltrate or effusion. Negative for heart failure. IMPRESSION: COPD without acute cardiopulmonary abnormality Large hiatal hernia. Electronically Signed   By: Franchot Gallo M.D.   On: 10/09/2015 13:50   Dg Chest 2 View  10/04/2015  CLINICAL DATA:  Weakness for 2 days EXAM: CHEST  2 VIEW  COMPARISON:  08/07/2012 FINDINGS: Normal cardiac silhouette. Large hiatal hernia posterior the heart. There is peripheral linear interstitial pattern similar to comparison exam. No focal infiltrate. No pneumothorax. IMPRESSION: 1. Interstitial edema. 2. Large hiatal hernia. Electronically Signed   By: Suzy Bouchard M.D.   On: 10/04/2015 13:48   US Abdomen Complete  10/10/2015  CLINICAL DATA:  Chronic nausea and vomiting.  Initial encounter. EXAM: ABDOMEN ULTRASOUND COMPLETE COMPARISON:  None. FINDINGS: Gallbladder: No gallstones or wall thickening visualized. No sonographic Murphy sign noted by sonographer. Common bile duct: Diameter: 0.3 cm, within normal limits in caliber. Liver: No focal lesion identified. Mildly increased parenchymal echogenicity, likely reflecting fatty infiltration. IVC: No  abnormality visualized. Pancreas: Visualized portion unremarkable. Spleen: Size and appearance within normal limits. Right Kidney: Length: 10.3 cm. Echogenicity within normal limits. No mass or hydronephrosis visualized. Left Kidney: Length: 10.3 cm. Echogenicity within normal limits. No mass or hydronephrosis visualized. Abdominal aorta: No aneurysm visualized. Mild calcification is noted along the abdominal aorta. Other findings: None. IMPRESSION: 1. No acute abnormality seen within the abdomen. 2. Mild calcification along the abdominal aorta. 3. Mildly fatty infiltration within the liver. Electronically Signed   By: Garald Balding M.D.   On: 10/10/2015 00:14   Nm Hepato W/eject Fract  10/12/2015  CLINICAL DATA:  82 year old female with history of abdominal pain and nausea for the past several weeks. EXAM: NUCLEAR MEDICINE HEPATOBILIARY IMAGING WITH GALLBLADDER EF TECHNIQUE: Sequential images of the abdomen were obtained out to 60 minutes following intravenous administration of radiopharmaceutical. After oral ingestion of Ensure, gallbladder ejection fraction was determined. At 60 min, normal ejection fraction is greater than 33%. RADIOPHARMACEUTICALS:  5.1 mCi Tc-72m Choletec IV COMPARISON:  No priors. FINDINGS: Prompt uptake and biliary excretion of activity by the liver is seen. Gallbladder activity is visualized, consistent with patency of cystic duct. Biliary activity passes into small bowel, consistent with patent common bile duct. Calculated gallbladder ejection fraction is 94%. (Normal gallbladder ejection fraction with Ensure is greater than 33%.) IMPRESSION: 1. Normal study.  No evidence of acute cholecystitis. Electronically Signed   By: DVinnie LangtonM.D.   On: 10/12/2015 14:12   Dg Ugi W/high Density W/kub  10/13/2015  CLINICAL DATA:  Nausea.  Hiatal hernia. EXAM: UPPER GI SERIES WITH KUB TECHNIQUE: After obtaining a scout radiograph a routine upper GI series was performed using thin barium  FLUOROSCOPY TIME:  Radiation Exposure Index (as provided by the fluoroscopic device): If the device does not provide the exposure index: Fluoroscopy Time (in minutes and seconds):  1 minutes 54 seconds Number of Acquired Images:  1 COMPARISON:  CT 10/10/2015 FINDINGS: Scout film of the abdomen shows a nonobstructive bowel gas pattern. No free air. Fluoroscopic evaluation of swallowing demonstrates disruption of primary esophageal peristaltic waves. No fixed esophageal stricture or fold thickening. There is a large paraesophageal type hiatal hernia with much of the stomach in the right lower chest. The distal stomach is within the right upper quadrant the abdomen. No ulceration or mass. Duodenal bulb and proximal duodenum unremarkable. Gastroesophageal reflux noted into the upper thoracic esophagus. IMPRESSION: Large paraesophageal hiatal hernia with much of the stomach in the right lower chest. Spontaneous gastroesophageal reflux into the upper thoracic esophagus. Electronically Signed   By: KRolm BaptiseM.D.   On: 10/13/2015 09:33        Filed Weights   10/11/15 2052 10/12/15 1933 10/13/15 2128  Weight: 98.431 kg (217 lb) 98.93 kg (218 lb 1.6 oz) 97.886 kg (215  lb 12.8 oz)     Microbiology: Recent Results (from the past 240 hour(s))  MRSA PCR Screening     Status: None   Collection Time: 10/09/15 10:36 PM  Result Value Ref Range Status   MRSA by PCR NEGATIVE NEGATIVE Final    Comment:        The GeneXpert MRSA Assay (FDA approved for NASAL specimens only), is one component of a comprehensive MRSA colonization surveillance program. It is not intended to diagnose MRSA infection nor to guide or monitor treatment for MRSA infections.   Urine culture     Status: Abnormal   Collection Time: 10/11/15 11:55 AM  Result Value Ref Range Status   Specimen Description URINE, RANDOM  Final   Special Requests NONE  Final   Culture >=100,000 COLONIES/mL ENTEROCOCCUS SPECIES (A)  Final   Report  Status 10/13/2015 FINAL  Final   Organism ID, Bacteria ENTEROCOCCUS SPECIES (A)  Final      Susceptibility   Enterococcus species - MIC*    AMPICILLIN <=2 SENSITIVE Sensitive     LEVOFLOXACIN 2 SENSITIVE Sensitive     NITROFURANTOIN <=16 SENSITIVE Sensitive     VANCOMYCIN 1 SENSITIVE Sensitive     * >=100,000 COLONIES/mL ENTEROCOCCUS SPECIES       Blood Culture    Component Value Date/Time   SDES URINE, RANDOM 10/11/2015 1155   SPECREQUEST NONE 10/11/2015 1155   CULT >=100,000 COLONIES/mL ENTEROCOCCUS SPECIES* 10/11/2015 1155   REPTSTATUS 10/13/2015 FINAL 10/11/2015 1155      Labs: Results for orders placed or performed during the hospital encounter of 10/09/15 (from the past 48 hour(s))  Protime-INR     Status: Abnormal   Collection Time: 10/13/15  5:39 AM  Result Value Ref Range   Prothrombin Time 30.0 (H) 11.6 - 15.2 seconds   INR 2.92 (H) 0.00 - 1.49  CBC     Status: Abnormal   Collection Time: 10/13/15  5:39 AM  Result Value Ref Range   WBC 8.1 4.0 - 10.5 K/uL   RBC 4.65 3.87 - 5.11 MIL/uL   Hemoglobin 15.6 (H) 12.0 - 15.0 g/dL   HCT 44.5 36.0 - 46.0 %   MCV 95.7 78.0 - 100.0 fL   MCH 33.5 26.0 - 34.0 pg   MCHC 35.1 30.0 - 36.0 g/dL   RDW 13.5 11.5 - 15.5 %   Platelets 190 150 - 400 K/uL  Comprehensive metabolic panel     Status: Abnormal   Collection Time: 10/13/15  5:39 AM  Result Value Ref Range   Sodium 141 135 - 145 mmol/L   Potassium 4.3 3.5 - 5.1 mmol/L   Chloride 106 101 - 111 mmol/L   CO2 26 22 - 32 mmol/L   Glucose, Bld 93 65 - 99 mg/dL   BUN 14 6 - 20 mg/dL   Creatinine, Ser 0.92 0.44 - 1.00 mg/dL   Calcium 9.3 8.9 - 10.3 mg/dL   Total Protein 6.1 (L) 6.5 - 8.1 g/dL   Albumin 3.2 (L) 3.5 - 5.0 g/dL   AST 18 15 - 41 U/L   ALT 13 (L) 14 - 54 U/L   Alkaline Phosphatase 72 38 - 126 U/L   Total Bilirubin 0.9 0.3 - 1.2 mg/dL   GFR calc non Af Amer 57 (L) >60 mL/min   GFR calc Af Amer >60 >60 mL/min    Comment: (NOTE) The eGFR has been  calculated using the CKD EPI equation. This calculation has not been validated in all clinical  situations. eGFR's persistently <60 mL/min signify possible Chronic Kidney Disease.    Anion gap 9 5 - 15  Protime-INR     Status: Abnormal   Collection Time: 10/14/15  5:35 AM  Result Value Ref Range   Prothrombin Time 29.4 (H) 11.6 - 15.2 seconds   INR 2.84 (H) 0.00 - 1.49  CBC     Status: None   Collection Time: 10/14/15  5:35 AM  Result Value Ref Range   WBC 7.3 4.0 - 10.5 K/uL   RBC 4.49 3.87 - 5.11 MIL/uL   Hemoglobin 14.4 12.0 - 15.0 g/dL   HCT 43.0 36.0 - 46.0 %   MCV 95.8 78.0 - 100.0 fL   MCH 32.1 26.0 - 34.0 pg   MCHC 33.5 30.0 - 36.0 g/dL   RDW 13.5 11.5 - 15.5 %   Platelets 191 150 - 400 K/uL  Comprehensive metabolic panel     Status: Abnormal   Collection Time: 10/14/15  5:35 AM  Result Value Ref Range   Sodium 140 135 - 145 mmol/L   Potassium 4.2 3.5 - 5.1 mmol/L   Chloride 105 101 - 111 mmol/L   CO2 26 22 - 32 mmol/L   Glucose, Bld 94 65 - 99 mg/dL   BUN 20 6 - 20 mg/dL   Creatinine, Ser 1.07 (H) 0.44 - 1.00 mg/dL   Calcium 9.2 8.9 - 10.3 mg/dL   Total Protein 6.3 (L) 6.5 - 8.1 g/dL   Albumin 3.2 (L) 3.5 - 5.0 g/dL   AST 17 15 - 41 U/L   ALT 13 (L) 14 - 54 U/L   Alkaline Phosphatase 76 38 - 126 U/L   Total Bilirubin 0.8 0.3 - 1.2 mg/dL   GFR calc non Af Amer 48 (L) >60 mL/min   GFR calc Af Amer 55 (L) >60 mL/min    Comment: (NOTE) The eGFR has been calculated using the CKD EPI equation. This calculation has not been validated in all clinical situations. eGFR's persistently <60 mL/min signify possible Chronic Kidney Disease.    Anion gap 9 5 - 15     Lipid Panel  No results found for: CHOL, TRIG, HDL, CHOLHDL, VLDL, LDLCALC, LDLDIRECT   No results found for: HGBA1C   Lab Results  Component Value Date   CREATININE 1.07* 10/14/2015     Charlotte Castro is a 82 y.o. female history of chronic atrial fibrillation, hypothyroidism, hypertension presents  to the ER because of persistent nausea with vomiting. Patient states she has been having these symptoms over the last 1 month and has epigastric discomfort along with it. Patient's symptoms are mostly worse in the morning. Patient states having oat meal helps her. During her recent urgent care visit patient did state that area but really denies any diarrhea. In the ER patient also was found to be tachycardic with A. fib with RVR. Patient not sure if she had taken her metoprolol. Patient will be admitted for further management of her nausea vomiting and A. fib with RVR. Patient also had some brief episode chest pain 2-3 days ago but denies any chest pain at this time  Assessment and plan  Nausea vomiting with epigastric discomfort - Differentials include peptic ulcer disease versus gallbladder pathology vs gastroesophageal reflux. Abdominal ultrasound shows fatty liver but otherwise no other pathology.NM scan does not show acute cholecystitis.  . CT scan of the abdomen and pelvis negative. [facility] gastroenterology consulted and recommended an upper GI series. Symptoms improved after starting on a  PPI. Do not suspect cardiac issues. Cardiac enzymes negative. . As per cardiology notes in 2012 cardiac cath was unremarkable. Appreciate [facility] input for further recommendations. Negative  HIDA scan , no  biliary dyskinesia. Patient may need long-term PPI therapy and follow-up with gastroenterology and general surgery for the patient's paraesophageal hernia in the outpatient setting.   A. fib with RVR - rate controlled on metoprolol. Patient was started on Cardizem infusion in the ER by the ER physician. Off Cardizem drip, continue metoprolol  . Patient previously was not tolerant to amiodarone as per the cardiology notes. Patient's chads 2 vasc score is 4. Patient is on Coumadin per pharmacy.  Hypertension - on metoprolol.  Chronic kidney disease stage II - creatinine appears to be at  baseline.  Enterococcal UTI on ceftriaxone 3 days, which was discontinued, urine cultures are positive for enterococcus sensitive to ampicillin,   switched to amoxicillin for another 3 days  Hypothyroidism on Synthroid.     Discharge Exam:    Blood pressure 119/55, pulse 83, temperature 97.7 F (36.5 C), temperature source Oral, resp. rate 18, height 5' 8" (1.727 m), weight 97.886 kg (215 lb 12.8 oz), SpO2 100 %.  General exam: Appears calm and comfortable  Respiratory system: Clear to auscultation. Respiratory effort normal. Cardiovascular system: S1 & S2 heard, RRR. No JVD, murmurs, rubs, gallops or clicks. No pedal edema. Gastrointestinal system: Abdomen is nondistended, soft and nontender. No organomegaly or masses felt. Normal bowel sounds heard. Central nervous system: Alert and oriented. No focal neurological deficits. Extremities: Symmetric 5 x 5 power. Skin: No rashes, lesions or ulcers Psychiatry: Judgement and insight appear normal. Mood & affect appropriate.      Follow-up Information    Follow up with FULP, CAMMIE, MD. Schedule an appointment as soon as possible for a visit in 3 days.   Specialty:  Family Medicine   Contact information:   [address] [address] [phone]       Follow up with Cassell Clement, MD. Schedule an appointment as soon as possible for a visit in 1 week.   Specialty:  Gastroenterology   Why:  Hospital follow-up for nausea   Contact information:   [address] [place] [address] [phone]       Signed: Reyne Dumas 10/14/2015, 10:33 AM        Time spent >45 mins

## 2015-10-14 NOTE — Consult Note (Signed)
ANTICOAGULATION CONSULT NOTE - Follow up [facility] for Coumadin Indication: atrial fibrillation  Allergies  Allergen Reactions  . Iodinated Diagnostic Agents Shortness Of Breath    headache    Vital Signs: Temp: 97.7 F (36.5 C) (05/02 0934) Temp Source: Oral (05/02 0934) BP: 119/55 mmHg (05/02 0934) Pulse Rate: 83 (05/02 0934)  Labs:  Recent Labs  10/12/15 0616 10/13/15 0539 10/14/15 0535  HGB 14.9 15.6* 14.4  HCT 43.5 44.5 43.0  PLT 179 190 191  LABPROT 31.6* 30.0* 29.4*  INR 3.13* 2.92* 2.84*  CREATININE 0.92 0.92 1.07*    Estimated Creatinine Clearance: 51.3 mL/min (by C-G formula based on Cr of 1.07).  Assessment: 82yof on coumadin pta for afib, admitted with abdominal pain. Coumadin continued. Abdominal US and CT abdomen negative. HIDA scan showed large paraesophageal hiatal hernia. May have surgery as outpatient.   INR 2.84. Home dose: 5mg  daily except 2.5mg  on Thursday.  Goal of Therapy:  INR 2-3 Monitor platelets by anticoagulation protocol: Yes   Plan:  -note planned discharge today- patient to resume home dosing of 5mg  daily except 2.5mg  on Thursdays -no dose ordered for tonight as pt to be discharged. If order is needed, please contact pharamcy  Antania Hoefling D. Jacquelina Hewins, PharmD, BCPS Clinical Pharmacist Pager: [phone] 10/14/2015 12:52 PM

## 2015-10-17 ENCOUNTER — Encounter: Payer: Self-pay | Admitting: Pharmacist

## 2015-10-17 LAB — PROTIME-INR: INR: 2.5 — AB (ref 0.9–1.1)

## 2015-11-12 ENCOUNTER — Other Ambulatory Visit: Payer: Self-pay | Admitting: Interventional Cardiology

## 2015-11-14 ENCOUNTER — Telehealth: Payer: Self-pay | Admitting: *Deleted

## 2015-11-14 NOTE — Telephone Encounter (Signed)
Pt called stating needs refill on Coumadin Informed that we had not seen her since April 13,2017  Pt states saw PCP last week and they checked her INR and it was good 2.5. This nurse called Dennie Maizes at [facility] and spoke with [facility] and she said pt had been seen on May 5th and INR was 2.5 and she faxed results as well. This nurse called and spoke with pt and informed that the INR was done on May 5th now almost 1 month ago and that she was in range and to continue to take coumadin 5mg  daily except 2.5mg  on Thursdays and made an appt for her to be seen in coumadin clinic on June 6th and she does states had enough  Coumadin until seen at that time and verbalizes understanding

## 2015-11-18 ENCOUNTER — Ambulatory Visit (INDEPENDENT_AMBULATORY_CARE_PROVIDER_SITE_OTHER): Payer: Medicare Other

## 2015-11-18 DIAGNOSIS — Z5181 Encounter for therapeutic drug level monitoring: Secondary | ICD-10-CM | POA: Diagnosis not present

## 2015-11-18 DIAGNOSIS — I4891 Unspecified atrial fibrillation: Secondary | ICD-10-CM

## 2015-11-18 LAB — POCT INR: INR: 1.8

## 2015-12-02 ENCOUNTER — Ambulatory Visit (INDEPENDENT_AMBULATORY_CARE_PROVIDER_SITE_OTHER): Payer: Medicare Other | Admitting: Surgery

## 2015-12-02 DIAGNOSIS — Z5181 Encounter for therapeutic drug level monitoring: Secondary | ICD-10-CM | POA: Diagnosis not present

## 2015-12-02 DIAGNOSIS — I4891 Unspecified atrial fibrillation: Secondary | ICD-10-CM | POA: Diagnosis not present

## 2015-12-02 LAB — POCT INR: INR: 2.9

## 2015-12-23 ENCOUNTER — Ambulatory Visit (INDEPENDENT_AMBULATORY_CARE_PROVIDER_SITE_OTHER): Payer: Medicare Other

## 2015-12-23 DIAGNOSIS — Z5181 Encounter for therapeutic drug level monitoring: Secondary | ICD-10-CM

## 2015-12-23 DIAGNOSIS — I4891 Unspecified atrial fibrillation: Secondary | ICD-10-CM | POA: Diagnosis not present

## 2015-12-23 LAB — POCT INR: INR: 3.8

## 2016-01-13 ENCOUNTER — Ambulatory Visit (INDEPENDENT_AMBULATORY_CARE_PROVIDER_SITE_OTHER): Payer: Medicare Other | Admitting: Pharmacist

## 2016-01-13 DIAGNOSIS — Z5181 Encounter for therapeutic drug level monitoring: Secondary | ICD-10-CM

## 2016-01-13 DIAGNOSIS — I4891 Unspecified atrial fibrillation: Secondary | ICD-10-CM

## 2016-01-13 LAB — POCT INR: INR: 2.7

## 2016-01-14 ENCOUNTER — Other Ambulatory Visit: Payer: Self-pay | Admitting: Interventional Cardiology

## 2016-01-14 NOTE — Progress Notes (Signed)
Patient ID: Charlotte Castro, female   DOB: Feb 26, 1933, 82 y.o.   MRN: [id number]     Cardiology Office Note   Date:  01/15/2016   ID:  Charlotte Castro, DOB [birth year], MRN [id number]  PCP:  Antony Blackbird, MD    No chief complaint on file. f/u AFib   Wt Readings from Last 3 Encounters:  01/15/16 226 lb (102.5 kg)  10/13/15 215 lb 12.8 oz (97.9 kg)  10/04/15 230 lb (104.3 kg)       History of Present Illness: Charlotte Castro is a 82 y.o. female  who has had several different types of tachyarrhythmia and bradycardia which have been difficult to manage over the past few years, since 2012-13; due to other medical issues. She has had syncope and was hospitalized in 2014. We have tried to evaluate her rhythm at home to see if she has significant pauses or heart rates below 40. She does not want to wear any type of heart monitor. Her daughter came in the past, annoyed with her mothers current health condition. Daughter stated she is self-employed so she has to take time off from work every time the mother is sick. She "wanted to get to the bottom of this." She was sent back to Dr. Rayann Heman. THere was no indication for pacer. She is off of amiodarone. She had a grey coating on her eyelid-thought to be related to Amiodarone.  The patient was seeing neurology, but she stopped. She has a tremor and had been started on Parkinson's medicines. These aparently have been stopped. The patient disagreed with the diagnosis of Parkinsons. She was seen in the emergency room for syncope in 2014. Her main complaint is feeling weak and having joint pains. She does admit to significant anxiety which happens at random times. The daughter felt that anxiety is a big component of the patient's illness, when she was here at the last visit in 2/14.  THe patient was seen by ortho and rheumatology in the past. She took prednisone but this was tapered off. She still reports no energy and fatigue. She has had some difficulty in  regulating her INR. She is willing to try Eliquis but not Xarelto.  She heard bad things about Xarelto.  She had problems getting her Eliquis.  It apparently was not covered and would be very expensive.  SHe switched to warfarin.  No significant bleeding problems.  She had some mild hematuria in the past before warfarin which has resolved.   Since she was last here, she was admitted to the hospital.  She had a large hiatal hernia and was referred to surgery, but she declined an operation. She is trying to stick to a soft diet.     She had a fall, getting out of the car.  No syncope.  No hitting her head.  Coumadin has been well regulated.    She is still driving.  She only drives locally.    She has not  Been taking her furosemide for the past few weeks.     Past Medical History:  Diagnosis Date  . Atrial fibrillation ([place])   . Atrial flutter ([place])    typical appearing  . Atrial tachycardia ([place])    ablated 11/17/10  by JA  from the [facility] of the aorta  . Dizziness    chronic and of an unclear etiology  . Gastritis   . GERD (gastroesophageal reflux disease)   . HTN (hypertension)   . Hyperlipemia   .  Hypothyroidism   . Obesity   . Osteoporosis   . Vitamin D deficiency     Past Surgical History:  Procedure Laterality Date  . ATRIAL ABLATION SURGERY  11/17/10   Atrial tachycardia arising from [facility] of the aorta ablated by JA     Current Outpatient Prescriptions  Medication Sig Dispense Refill  . acetaminophen (TYLENOL) 325 MG tablet Take 325 mg by mouth every 4 (four) hours as needed for headache (pain). Reported on 10/04/2015    . Calcium Carbonate-Vitamin D (CALCIUM-VITAMIN D) 600-200 MG-UNIT CAPS Take 1 capsule by mouth daily. Reported on 10/04/2015    . Cholecalciferol (VITAMIN D3) 1000 UNITS CAPS Take 1,000 Units by mouth daily. Reported on 10/04/2015    . clonazePAM (KLONOPIN) 0.5 MG tablet Take 0.25-0.5 mg by mouth 2 (two) times daily. For tremors    . furosemide (LASIX) 20 MG  tablet Take 1 tablet (20 mg total) by mouth as directed. TAKE 20 MG 2 DAYS A WEEK 30 tablet 2  . levothyroxine (SYNTHROID, LEVOTHROID) 88 MCG tablet Take 88 mcg by mouth daily before breakfast.     . metoprolol (LOPRESSOR) 50 MG tablet Take 1 tablet (50 mg total) by mouth 2 (two) times daily. 60 tablet 2  . Multiple Vitamin (MULTIVITAMIN WITH MINERALS) TABS tablet Take 1 tablet by mouth daily.    Marland Kitchen OVER THE COUNTER MEDICATION Place 1 drop into both eyes daily as needed (dry eyes). Over the counter lubricating eye drop    . pantoprazole (PROTONIX) 40 MG tablet Take 1 tablet (40 mg total) by mouth daily. 30 tablet 3  . promethazine (PHENERGAN) 12.5 MG tablet Take 1 tablet (12.5 mg total) by mouth every 6 (six) hours as needed for nausea or vomiting. 30 tablet 0  . warfarin (COUMADIN) 5 MG tablet TAKE AS DIRECTED BY COUMADIN CLINIC 30 tablet 1   No current facility-administered medications for this visit.     Allergies:   Iodinated diagnostic agents    Social History:  The patient  reports that she has never smoked. She has never used smokeless tobacco. She reports that she does not drink alcohol or use drugs.   Family History:  The patient's family history includes Heart attack in her father; Hypertension in her father.    ROS:  Please see the history of present illness.   Otherwise, review of systems are positive for weakness, fatigue.   All other systems are reviewed and negative.    PHYSICAL EXAM: VS:  BP (!) 150/88   Pulse 92   Ht 5\' 8"  (1.727 m)   Wt 226 lb (102.5 kg)   BMI 34.36 kg/m  , BMI Body mass index is 34.36 kg/m. GEN: Well nourished, well developed, in no acute distress  HEENT: normal  Neck: no JVD, carotid bruits, or masses Cardiac: irregularly irregular; no murmurs, rubs, or gallops, tr bilateral edema  Respiratory:  clear to auscultation bilaterally, normal work of breathing GI: soft, nontender, nondistended, + BS MS: no deformity or atrophy  Skin: warm and dry,  no rash Neuro:  Strength and sensation are intact Psych: euthymic mood, full affect   Recent Labs: 10/04/2015: Magnesium 1.7 10/09/2015: TSH 3.141 10/14/2015: ALT 13; BUN 20; Creatinine, Ser 1.07; Hemoglobin 14.4; Platelets 191; Potassium 4.2; Sodium 140   Lipid Panel No results found for: CHOL, TRIG, HDL, CHOLHDL, VLDL, LDLCALC, LDLDIRECT   Other studies Reviewed: Additional studies/ records that were reviewed today with results demonstrating: Prior cath showing no CAD.  Normal renal function.  ECG: Rate controlled AFib   ASSESSMENT AND PLAN:  1. AFib:  Seen in th eAFib clinic in 2016.  Has had other arrhythmias in the past.  She did not feel that they did anything significant and was somewhat dissatisfied with the inconvenience. No changes made. Rate cotrolled today.  No palpitations. 2. Chronic fatigue sx. Going on for years.  No CAD by cath.  AFib controlled. I don't think this is cardiac. 3. HTN: BP controlled. Continue current medicines and lifestyle modifications. Readings elevated today, but usually controlled. 4. Re: Eliquis--    Cost is an issue.  Back on warfarin for stroke prevention. Doing well on warfarin.  Avoid falls.  5. DMV form filled out.  She does not have CHF, pacer, AICD or CAD.  Only rate controlled AFib.  NYHA II.  Biggest limitations are from knee problems and tremor.  She will f/u with PMD.   Current medicines are reviewed at length with the patient today.  The patient concerns regarding her medicines were addressed.  The following changes have been made:  Stop COumadin. Start Eliquis  Labs/ tests ordered today include: INR; Cr 1.0 in 6/16. No orders of the defined types were placed in this encounter.   Recommend 150 minutes/week of aerobic exercise Low fat, low carb, high fiber diet recommended  Disposition:   FU in 6 months   Signed, Charlotte Grooms, MD  01/15/2016 10:03 AM    [facility] [address], [place], [place]   [address] Phone: [phone]; Fax: [phone]    Patient ID: Charlotte Castro, female   DOB: [birth year], 82 y.o.   MRN: [id number] Patient ID: Charlotte Castro, female   DOB: 1932-07-04, 82 y.o.   MRN: [id number]     Cardiology Office Note   Date:  01/15/2016   ID:  Charlotte Castro, DOB 1932/08/18, MRN [id number]  PCP:  Antony Blackbird, MD    No chief complaint on file. f/u AFib   Wt Readings from Last 3 Encounters:  01/15/16 226 lb (102.5 kg)  10/13/15 215 lb 12.8 oz (97.9 kg)  10/04/15 230 lb (104.3 kg)       History of Present Illness: Charlotte Castro is a 82 y.o. female  who has had several different types of tachyarrhythmia and bradycardia which have been difficult to manage over the past few years, since 2012-13; due to other medical issues. She has had syncope and was hospitalized in 2014. We have tried to evaluate her rhythm at home to see if she has significant pauses or heart rates below 40. She does not want to wear any type of heart monitor. Her daughter came in the past, annoyed with her mothers current health condition. Daughter stated she is self-employed so she has to take time off from work every time the mother is sick. She "wanted to get to the bottom of this." She was sent back to Dr. Rayann Heman. THere was no indication for pacer. She is off of amiodarone. She had a grey coating on her eyelid-thought to be related to Amiodarone.  The patient was seeing neurology, but she stopped. She has a tremor and had been started on Parkinson's medicines. These aparently have been stopped. The patient disagreed with the diagnosis of Parkinsons. She was seen in the emergency room for syncope in 2014. Her main complaint is feeling weak and having joint pains. She does admit to significant anxiety which happens at random times. The daughter felt that anxiety  is a big component of the patient's illness, when she was here at the last visit in 2/14.  THe patient was seen by ortho and rheumatology  in the past. She took prednisone but this was tapered off. She still reports no energy and fatigue. She has had some difficulty in regulating her INR. She is willing to try Eliquis but not Xarelto.  She heard bad things about Xarelto.  Since the last visit, she reports having problems getting her Eliquis.  It apparently is not covered and would be very expensive.  SHe wants to know what other alternative.  No significant bleeding problems.  She had some mild hematuria which has apparently resolved.   She is having to have a form filled out to get her license renewed. She has a form with her today. She only drives locally. She denies any lightheadedness or passing out. No chest pain. No palpitations.    Past Medical History:  Diagnosis Date  . Atrial fibrillation ([place])   . Atrial flutter ([place])    typical appearing  . Atrial tachycardia ([place])    ablated 11/17/10  by JA  from the [facility] of the aorta  . Dizziness    chronic and of an unclear etiology  . Gastritis   . GERD (gastroesophageal reflux disease)   . HTN (hypertension)   . Hyperlipemia   . Hypothyroidism   . Obesity   . Osteoporosis   . Vitamin D deficiency     Past Surgical History:  Procedure Laterality Date  . ATRIAL ABLATION SURGERY  11/17/10   Atrial tachycardia arising from [facility] of the aorta ablated by JA     Current Outpatient Prescriptions  Medication Sig Dispense Refill  . acetaminophen (TYLENOL) 325 MG tablet Take 325 mg by mouth every 4 (four) hours as needed for headache (pain). Reported on 10/04/2015    . Calcium Carbonate-Vitamin D (CALCIUM-VITAMIN D) 600-200 MG-UNIT CAPS Take 1 capsule by mouth daily. Reported on 10/04/2015    . Cholecalciferol (VITAMIN D3) 1000 UNITS CAPS Take 1,000 Units by mouth daily. Reported on 10/04/2015    . clonazePAM (KLONOPIN) 0.5 MG tablet Take 0.25-0.5 mg by mouth 2 (two) times daily. For tremors    . furosemide (LASIX) 20 MG tablet Take 1 tablet (20 mg total) by mouth as directed.  TAKE 20 MG 2 DAYS A WEEK 30 tablet 2  . levothyroxine (SYNTHROID, LEVOTHROID) 88 MCG tablet Take 88 mcg by mouth daily before breakfast.     . metoprolol (LOPRESSOR) 50 MG tablet Take 1 tablet (50 mg total) by mouth 2 (two) times daily. 60 tablet 2  . Multiple Vitamin (MULTIVITAMIN WITH MINERALS) TABS tablet Take 1 tablet by mouth daily.    Marland Kitchen OVER THE COUNTER MEDICATION Place 1 drop into both eyes daily as needed (dry eyes). Over the counter lubricating eye drop    . pantoprazole (PROTONIX) 40 MG tablet Take 1 tablet (40 mg total) by mouth daily. 30 tablet 3  . promethazine (PHENERGAN) 12.5 MG tablet Take 1 tablet (12.5 mg total) by mouth every 6 (six) hours as needed for nausea or vomiting. 30 tablet 0  . warfarin (COUMADIN) 5 MG tablet TAKE AS DIRECTED BY COUMADIN CLINIC 30 tablet 1   No current facility-administered medications for this visit.     Allergies:   Iodinated diagnostic agents    Social History:  The patient  reports that she has never smoked. She has never used smokeless tobacco. She reports that she does not drink  alcohol or use drugs.   Family History:  The patient's family history includes Heart attack in her father; Hypertension in her father.    ROS:  Please see the history of present illness.   Otherwise, review of systems are positive for weakness, fatigue.   All other systems are reviewed and negative.    PHYSICAL EXAM: VS:  BP (!) 150/88   Pulse 92   Ht 5\' 8"  (1.727 m)   Wt 226 lb (102.5 kg)   BMI 34.36 kg/m  , BMI Body mass index is 34.36 kg/m. GEN: Well nourished, well developed, in no acute distress  HEENT: normal  Neck: no JVD, carotid bruits, or masses Cardiac: irregularly irregular; no murmurs, rubs, or gallops, tr bilateral edema  Respiratory:  clear to auscultation bilaterally, normal work of breathing GI: soft, nontender, nondistended, + BS MS: no deformity or atrophy  Skin: warm and dry, no rash Neuro:  Strength and sensation are intact Psych:  euthymic mood, full affect   Recent Labs: 10/04/2015: Magnesium 1.7 10/09/2015: TSH 3.141 10/14/2015: ALT 13; BUN 20; Creatinine, Ser 1.07; Hemoglobin 14.4; Platelets 191; Potassium 4.2; Sodium 140   Lipid Panel No results found for: CHOL, TRIG, HDL, CHOLHDL, VLDL, LDLCALC, LDLDIRECT   Other studies Reviewed: Additional studies/ records that were reviewed today with results demonstrating: Prior cath showing no CAD.  Normal renal function.  ECG: Rate controlled AFib   ASSESSMENT AND PLAN:  1. AFib:  Seen in th eAFib clinic in 2016.  Has had other arrhythmias in the past.  She did not feel that they did anything significant and was somewhat dissatisfied with the inconvenience. No changes made. Rate cotrolled today.  No palpitations. 2. Chronic fatigue sx. Going on for years.  No CAD by cath.  AFib controlled. I don't think this is cardiac. Encouraged her to try nutritional supplement like Ensure. 3. HTN: BP controlled. Continue current medicines and lifestyle modifications. Readings elevated today, but usually controlled. 4. Changed COumadin to Eliquis.    Cost is an issue.  Copay card no longer accepted.  SHe will see the PharmD today.  They will coordinate her switch back to Coumadin and more visits for COumadin checks. 5.  She does not have CHF, pacer, AICD or CAD.  Only rate controlled AFib.  NYHA II.  Biggest limitations are from knee problems and tremor.  She will f/u with PMD.   Current medicines are reviewed at length with the patient today.  The patient concerns regarding her medicines were addressed.  The following changes have been made:  Stop COumadin. Start Eliquis  Labs/ tests ordered today include: INR; Cr 1.0 in 6/16. No orders of the defined types were placed in this encounter.   Recommend 150 minutes/week of aerobic exercise Low fat, low carb, high fiber diet recommended  Disposition:   FU in 6 months   Signed, Charlotte Grooms, MD  01/15/2016 10:03 AM    [facility] [place], [place], [place]  [address] Phone: [phone]; Fax: [phone]

## 2016-01-15 ENCOUNTER — Encounter: Payer: Self-pay | Admitting: Interventional Cardiology

## 2016-01-15 ENCOUNTER — Ambulatory Visit (INDEPENDENT_AMBULATORY_CARE_PROVIDER_SITE_OTHER): Payer: Medicare Other | Admitting: Interventional Cardiology

## 2016-01-15 VITALS — BP 150/88 | HR 92 | Ht 68.0 in | Wt 226.0 lb

## 2016-01-15 DIAGNOSIS — I10 Essential (primary) hypertension: Secondary | ICD-10-CM | POA: Diagnosis not present

## 2016-01-15 DIAGNOSIS — R5383 Other fatigue: Secondary | ICD-10-CM | POA: Diagnosis not present

## 2016-01-15 DIAGNOSIS — I482 Chronic atrial fibrillation, unspecified: Secondary | ICD-10-CM

## 2016-01-15 NOTE — Patient Instructions (Signed)
Medication Instructions:  Same-no changes  Labwork: None  Testing/Procedures: None  Follow-Up: Your physician wants you to follow-up in: 6 months. You will receive a reminder letter in the mail two months in advance. If you don't receive a letter, please call our office to schedule the follow-up appointment.      If you need a refill on your cardiac medications before your next appointment, please call your pharmacy.   

## 2016-02-05 ENCOUNTER — Encounter (INDEPENDENT_AMBULATORY_CARE_PROVIDER_SITE_OTHER): Payer: Self-pay

## 2016-02-05 ENCOUNTER — Ambulatory Visit (INDEPENDENT_AMBULATORY_CARE_PROVIDER_SITE_OTHER): Payer: Medicare Other

## 2016-02-05 DIAGNOSIS — I4891 Unspecified atrial fibrillation: Secondary | ICD-10-CM | POA: Diagnosis not present

## 2016-02-05 DIAGNOSIS — Z5181 Encounter for therapeutic drug level monitoring: Secondary | ICD-10-CM | POA: Diagnosis not present

## 2016-02-05 LAB — POCT INR: INR: 3.4

## 2016-02-26 ENCOUNTER — Ambulatory Visit (INDEPENDENT_AMBULATORY_CARE_PROVIDER_SITE_OTHER): Payer: Medicare Other | Admitting: Pharmacist

## 2016-02-26 DIAGNOSIS — I4891 Unspecified atrial fibrillation: Secondary | ICD-10-CM

## 2016-02-26 DIAGNOSIS — Z5181 Encounter for therapeutic drug level monitoring: Secondary | ICD-10-CM

## 2016-02-26 LAB — POCT INR: INR: 4

## 2016-03-11 ENCOUNTER — Ambulatory Visit (INDEPENDENT_AMBULATORY_CARE_PROVIDER_SITE_OTHER): Payer: Medicare Other | Admitting: *Deleted

## 2016-03-11 ENCOUNTER — Other Ambulatory Visit: Payer: Self-pay | Admitting: Interventional Cardiology

## 2016-03-11 DIAGNOSIS — Z5181 Encounter for therapeutic drug level monitoring: Secondary | ICD-10-CM

## 2016-03-11 DIAGNOSIS — I4891 Unspecified atrial fibrillation: Secondary | ICD-10-CM | POA: Diagnosis not present

## 2016-03-11 LAB — POCT INR: INR: 2

## 2016-04-01 ENCOUNTER — Ambulatory Visit (INDEPENDENT_AMBULATORY_CARE_PROVIDER_SITE_OTHER): Payer: Medicare Other | Admitting: *Deleted

## 2016-04-01 DIAGNOSIS — Z5181 Encounter for therapeutic drug level monitoring: Secondary | ICD-10-CM

## 2016-04-01 LAB — POCT INR: INR: 2.8

## 2016-04-09 ENCOUNTER — Emergency Department (HOSPITAL_COMMUNITY): Payer: Medicare Other

## 2016-04-09 ENCOUNTER — Encounter (HOSPITAL_COMMUNITY): Payer: Self-pay

## 2016-04-09 ENCOUNTER — Inpatient Hospital Stay (HOSPITAL_COMMUNITY)
Admission: EM | Admit: 2016-04-09 | Discharge: 2016-04-13 | DRG: 872 | Disposition: A | Discharge status: Skilled Nursing Facility | Payer: Medicare Other | Attending: Internal Medicine | Admitting: Internal Medicine

## 2016-04-09 DIAGNOSIS — B9620 Unspecified Escherichia coli [E. coli] as the cause of diseases classified elsewhere: Secondary | ICD-10-CM | POA: Diagnosis present

## 2016-04-09 DIAGNOSIS — I5032 Chronic diastolic (congestive) heart failure: Secondary | ICD-10-CM | POA: Diagnosis present

## 2016-04-09 DIAGNOSIS — Z8249 Family history of ischemic heart disease and other diseases of the circulatory system: Secondary | ICD-10-CM | POA: Diagnosis not present

## 2016-04-09 DIAGNOSIS — I4821 Permanent atrial fibrillation: Secondary | ICD-10-CM | POA: Diagnosis present

## 2016-04-09 DIAGNOSIS — Z7901 Long term (current) use of anticoagulants: Secondary | ICD-10-CM | POA: Diagnosis not present

## 2016-04-09 DIAGNOSIS — Z91041 Radiographic dye allergy status: Secondary | ICD-10-CM | POA: Diagnosis not present

## 2016-04-09 DIAGNOSIS — K449 Diaphragmatic hernia without obstruction or gangrene: Secondary | ICD-10-CM | POA: Diagnosis present

## 2016-04-09 DIAGNOSIS — I482 Chronic atrial fibrillation: Secondary | ICD-10-CM | POA: Diagnosis present

## 2016-04-09 DIAGNOSIS — E860 Dehydration: Secondary | ICD-10-CM | POA: Diagnosis present

## 2016-04-09 DIAGNOSIS — W06XXXA Fall from bed, initial encounter: Secondary | ICD-10-CM | POA: Diagnosis present

## 2016-04-09 DIAGNOSIS — I10 Essential (primary) hypertension: Secondary | ICD-10-CM | POA: Diagnosis present

## 2016-04-09 DIAGNOSIS — R531 Weakness: Secondary | ICD-10-CM

## 2016-04-09 DIAGNOSIS — N390 Urinary tract infection, site not specified: Secondary | ICD-10-CM | POA: Diagnosis present

## 2016-04-09 DIAGNOSIS — N10 Acute tubulo-interstitial nephritis: Secondary | ICD-10-CM

## 2016-04-09 DIAGNOSIS — A419 Sepsis, unspecified organism: Secondary | ICD-10-CM | POA: Diagnosis present

## 2016-04-09 DIAGNOSIS — K219 Gastro-esophageal reflux disease without esophagitis: Secondary | ICD-10-CM | POA: Diagnosis present

## 2016-04-09 DIAGNOSIS — Z79899 Other long term (current) drug therapy: Secondary | ICD-10-CM | POA: Diagnosis not present

## 2016-04-09 DIAGNOSIS — E039 Hypothyroidism, unspecified: Secondary | ICD-10-CM | POA: Diagnosis present

## 2016-04-09 DIAGNOSIS — Y92003 Bedroom of unspecified non-institutional (private) residence as the place of occurrence of the external cause: Secondary | ICD-10-CM

## 2016-04-09 DIAGNOSIS — I110 Hypertensive heart disease with heart failure: Secondary | ICD-10-CM | POA: Diagnosis present

## 2016-04-09 DIAGNOSIS — R748 Abnormal levels of other serum enzymes: Secondary | ICD-10-CM | POA: Diagnosis present

## 2016-04-09 LAB — CBC
HEMATOCRIT: 45 % (ref 36.0–46.0)
HEMOGLOBIN: 16.2 g/dL — AB (ref 12.0–15.0)
MCH: 33.7 pg (ref 26.0–34.0)
MCHC: 36 g/dL (ref 30.0–36.0)
MCV: 93.6 fL (ref 78.0–100.0)
Platelets: 167 10*3/uL (ref 150–400)
RBC: 4.81 MIL/uL (ref 3.87–5.11)
RDW: 13.6 % (ref 11.5–15.5)
WBC: 15.6 10*3/uL — ABNORMAL HIGH (ref 4.0–10.5)

## 2016-04-09 LAB — DIFFERENTIAL
BASOS ABS: 0 10*3/uL (ref 0.0–0.1)
BASOS PCT: 0 %
EOS ABS: 0 10*3/uL (ref 0.0–0.7)
EOS PCT: 0 %
LYMPHS ABS: 0.9 10*3/uL (ref 0.7–4.0)
Lymphocytes Relative: 6 %
MONO ABS: 1.1 10*3/uL — AB (ref 0.1–1.0)
MONOS PCT: 7 %
Neutro Abs: 13.6 10*3/uL — ABNORMAL HIGH (ref 1.7–7.7)
Neutrophils Relative %: 87 %

## 2016-04-09 LAB — URINALYSIS, ROUTINE W REFLEX MICROSCOPIC
BILIRUBIN URINE: NEGATIVE
Glucose, UA: NEGATIVE mg/dL
KETONES UR: 40 mg/dL — AB
NITRITE: POSITIVE — AB
PH: 6 (ref 5.0–8.0)
PROTEIN: NEGATIVE mg/dL
Specific Gravity, Urine: 1.02 (ref 1.005–1.030)

## 2016-04-09 LAB — PROTIME-INR
INR: 1.33
Prothrombin Time: 16.6 seconds — ABNORMAL HIGH (ref 11.4–15.2)

## 2016-04-09 LAB — COMPREHENSIVE METABOLIC PANEL
ALT: 21 U/L (ref 14–54)
ANION GAP: 9 (ref 5–15)
AST: 39 U/L (ref 15–41)
Albumin: 3.7 g/dL (ref 3.5–5.0)
Alkaline Phosphatase: 75 U/L (ref 38–126)
BILIRUBIN TOTAL: 2.1 mg/dL — AB (ref 0.3–1.2)
BUN: 14 mg/dL (ref 6–20)
CHLORIDE: 106 mmol/L (ref 101–111)
CO2: 24 mmol/L (ref 22–32)
Calcium: 9.4 mg/dL (ref 8.9–10.3)
Creatinine, Ser: 0.8 mg/dL (ref 0.44–1.00)
Glucose, Bld: 120 mg/dL — ABNORMAL HIGH (ref 65–99)
POTASSIUM: 4 mmol/L (ref 3.5–5.1)
Sodium: 139 mmol/L (ref 135–145)
TOTAL PROTEIN: 6.6 g/dL (ref 6.5–8.1)

## 2016-04-09 LAB — URINE MICROSCOPIC-ADD ON

## 2016-04-09 LAB — I-STAT TROPONIN, ED: TROPONIN I, POC: 0.01 ng/mL (ref 0.00–0.08)

## 2016-04-09 LAB — CK: Total CK: 483 U/L — ABNORMAL HIGH (ref 38–234)

## 2016-04-09 LAB — ETHANOL

## 2016-04-09 LAB — APTT: APTT: 29 s (ref 24–36)

## 2016-04-09 IMAGING — DX DG CHEST 2V
2 series · 2 of 2 positions shown · non-contrast
Comparison: [DATE].

CLINICAL DATA: Hypertension, patient found on floor after fall

EXAM:
CHEST  2 VIEW

[chest lat]
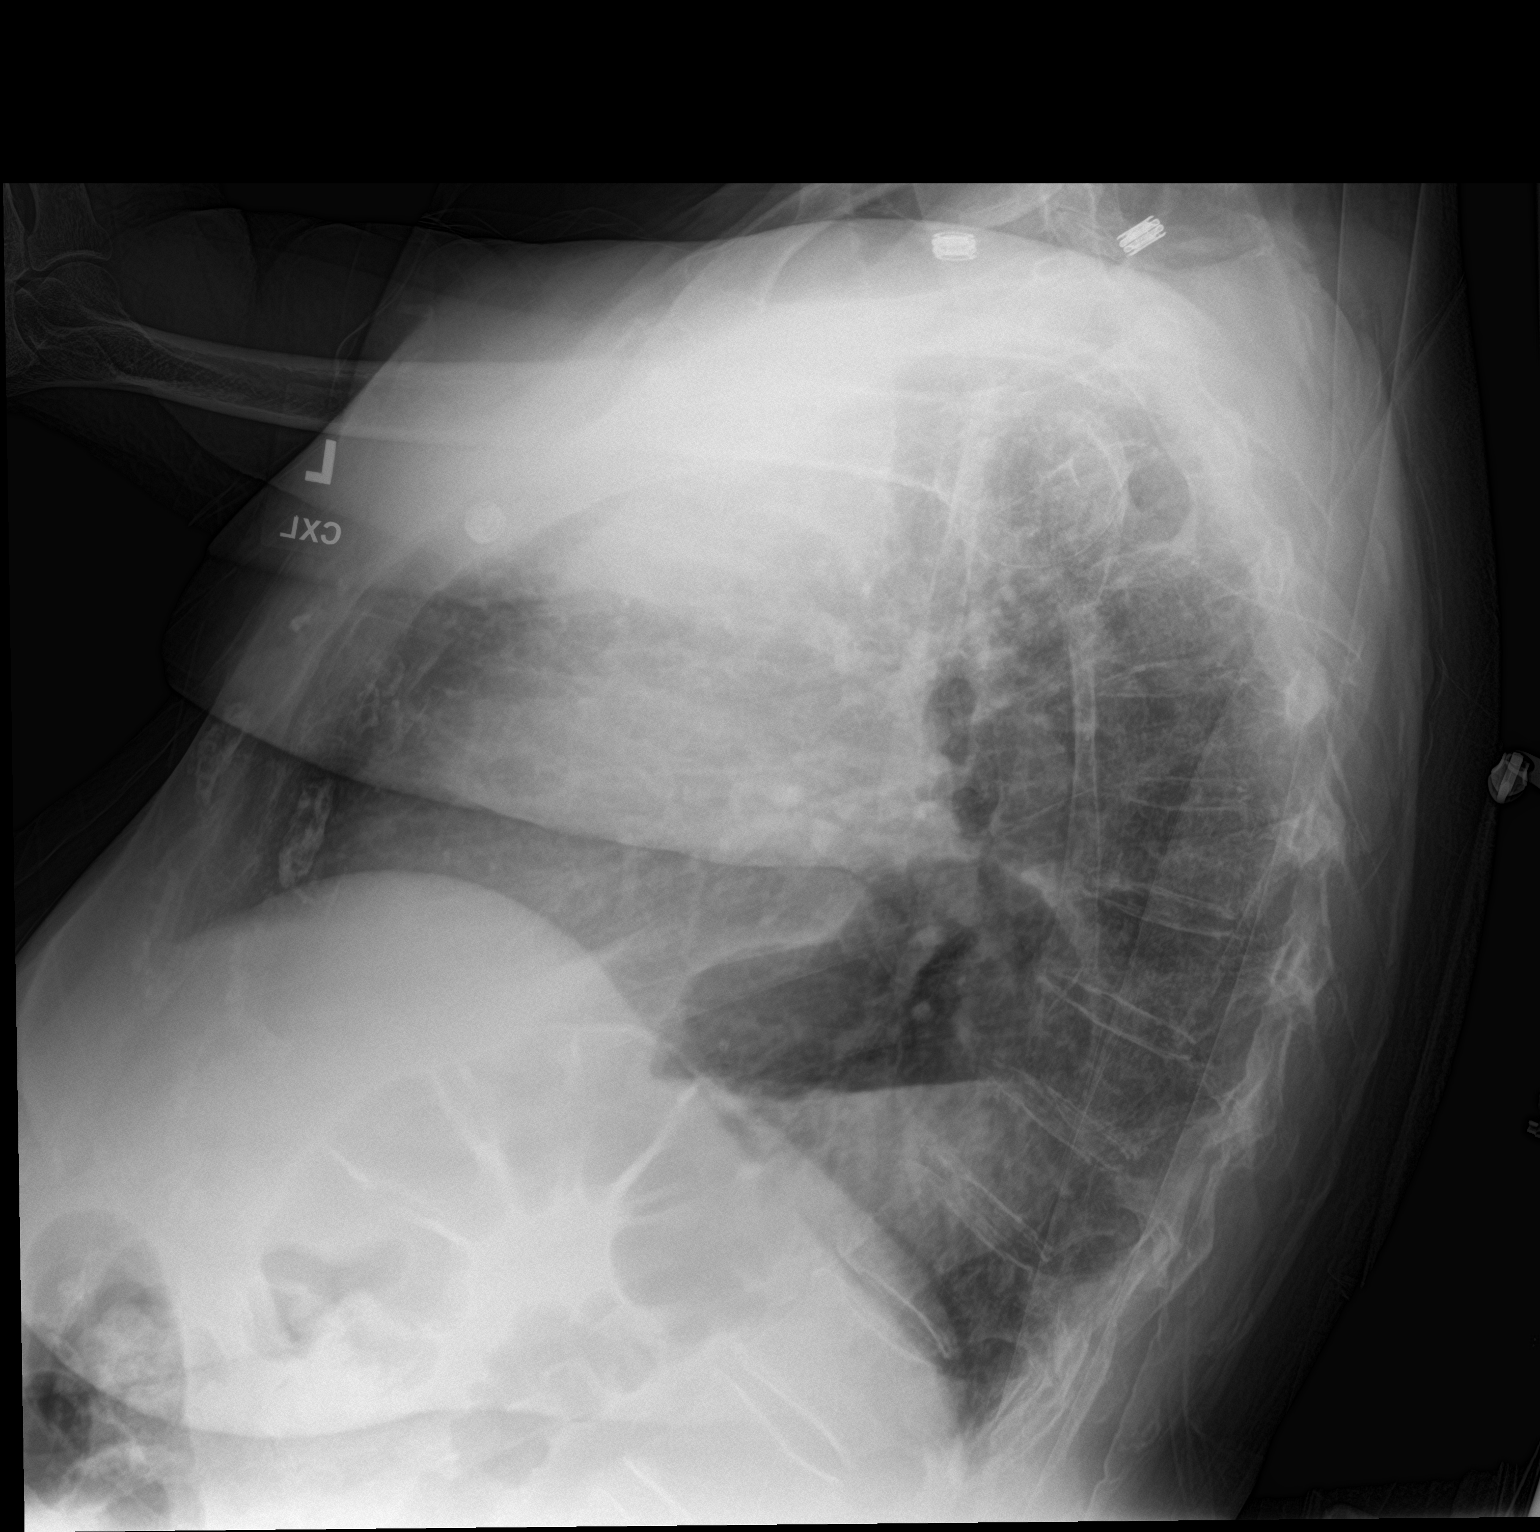

[chest ap]
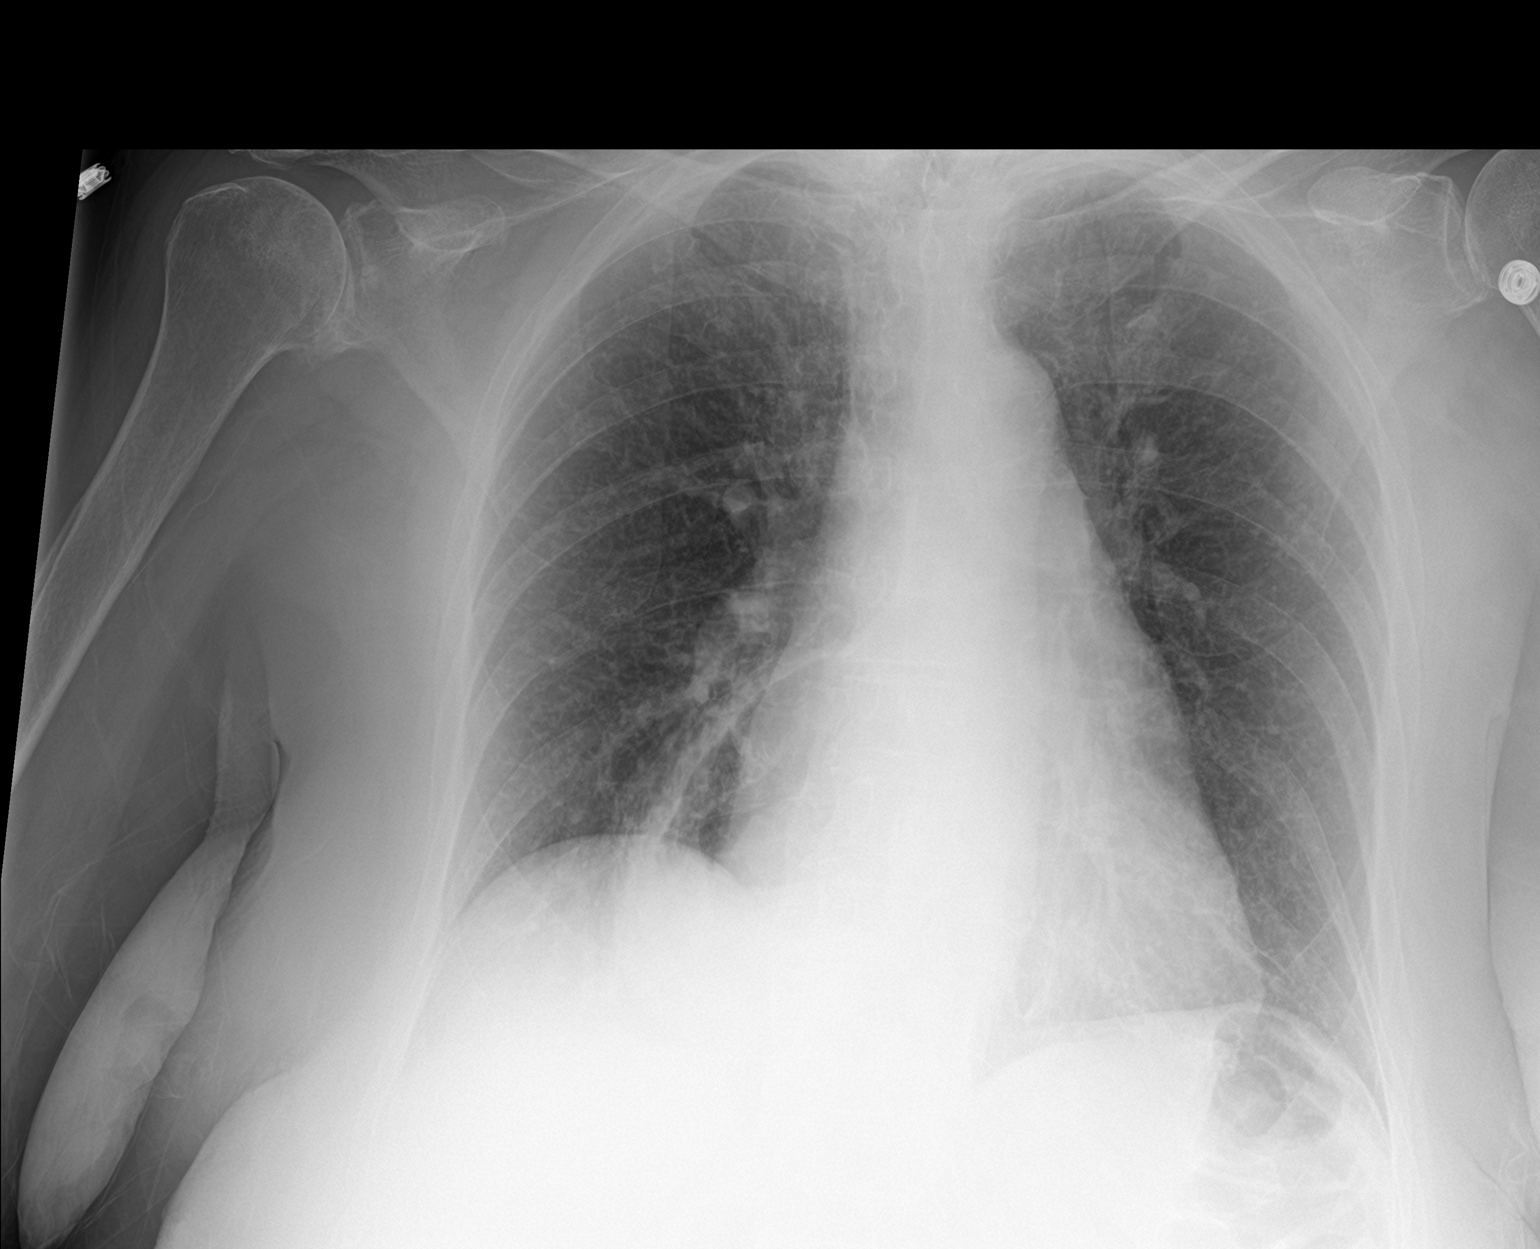

[2 of 2 positions shown; findings below may reference images not displayed]

FINDINGS: There is no focal infiltrate or effusion. Stable mild enlargement of
the cardiomediastinal. There is no pneumothorax. There is a large
hiatal hernia again evident. Bones appear osteopenic.
IMPRESSION: 1. No acute infiltrate or edema.
2. Large hiatal hernia
3. Mild cardiomegaly without overt failure.

## 2016-04-09 IMAGING — CT CT HEAD W/O CM
3 of 4 series · 13 of 47 positions shown, 15 images · non-contrast
Comparison: [DATE], [DATE]

CLINICAL DATA: Frontal headache

EXAM:
CT HEAD WITHOUT CONTRAST
TECHNIQUE: Contiguous axial images were obtained from the base of the skull
through the vertex without intravenous contrast.

[Series 2: head without ax · axial · non-contrast · 0.31mm/px · z∈[+1304,+1421]mm · 7 of 34 slices shown, 9 images]
[im 5/34  brain]
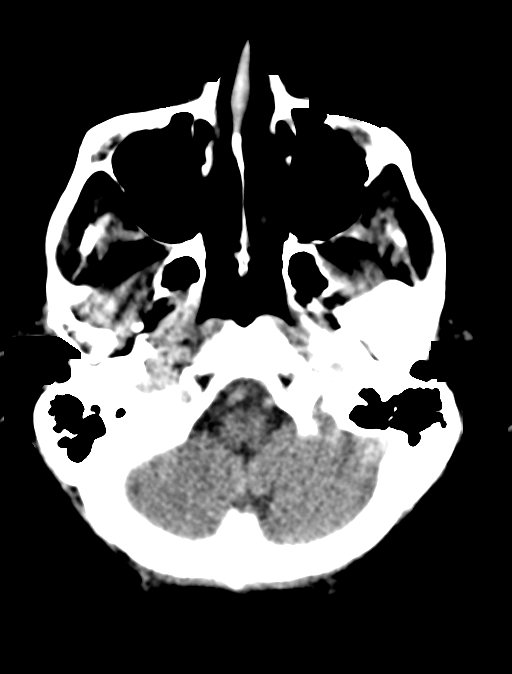
[im 5/34  bone]
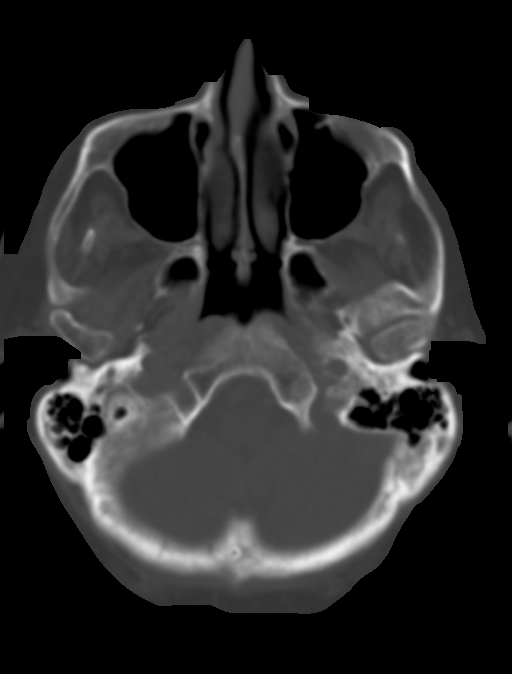
[im 9/34  brain]
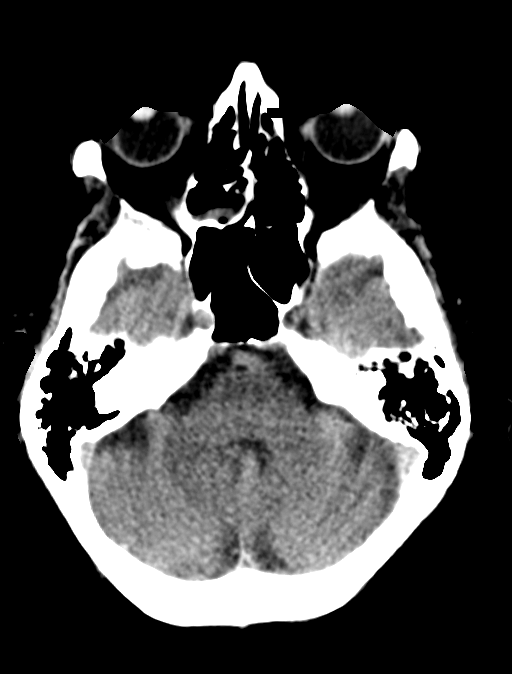
[im 13/34  brain]
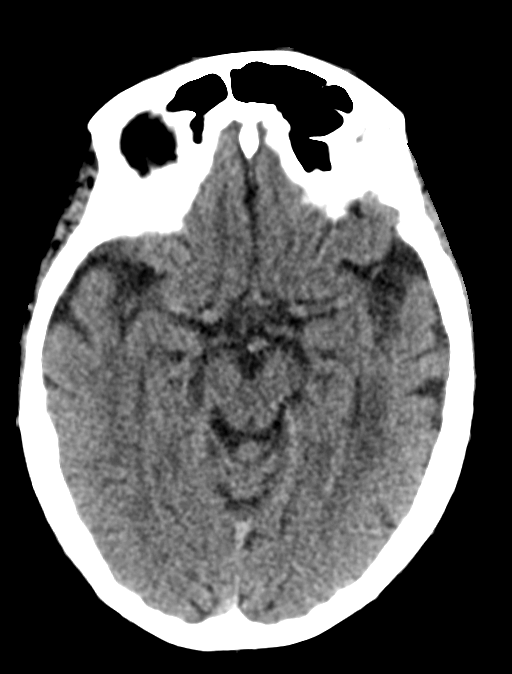
[im 17/34  brain]
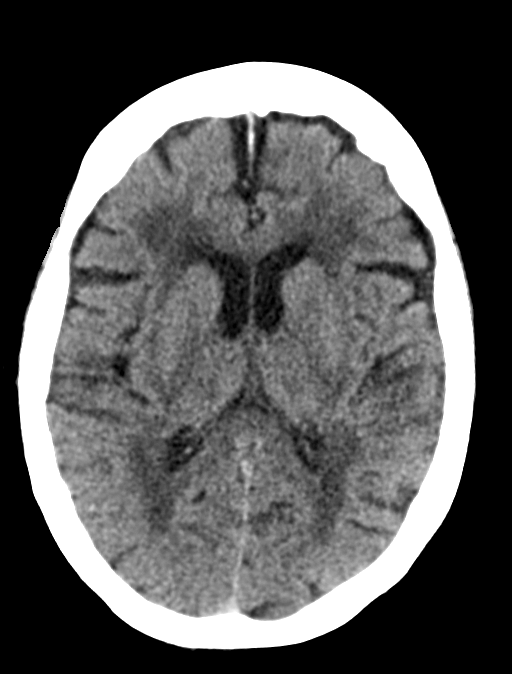
[im 21/34  brain]
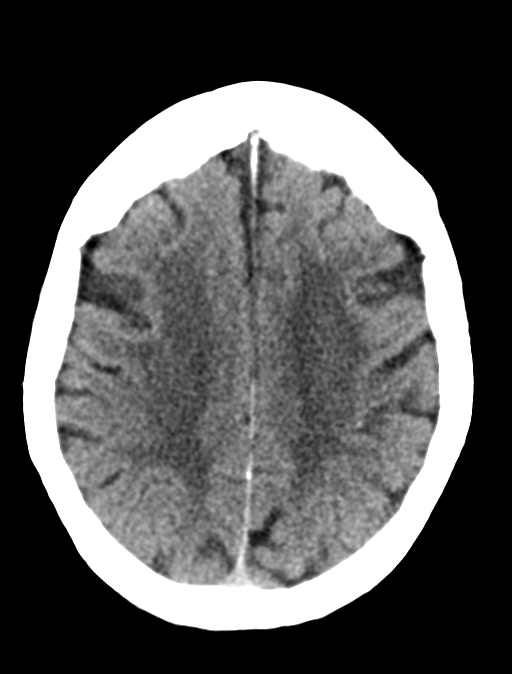
[im 21/34  bone]
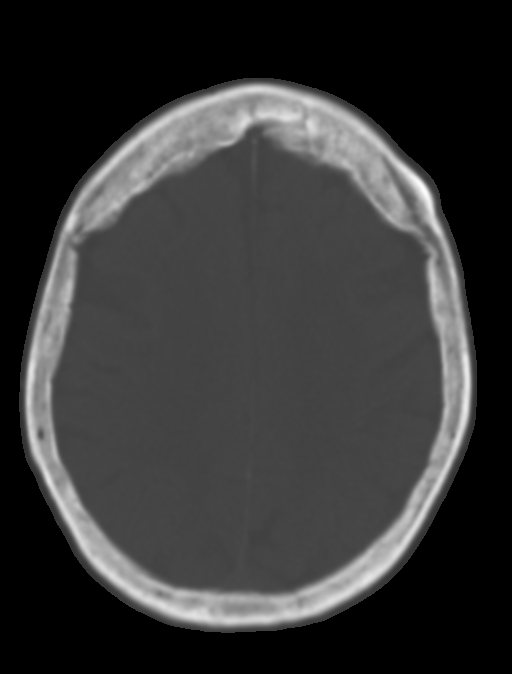
[im 25/34  brain]
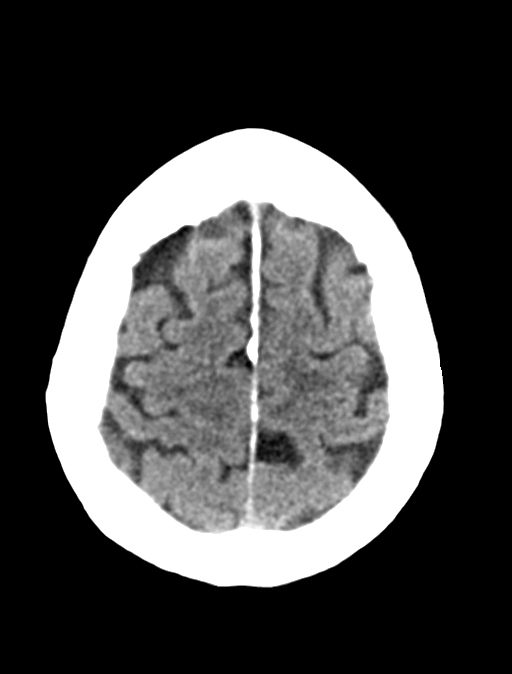
[im 29/34  brain]
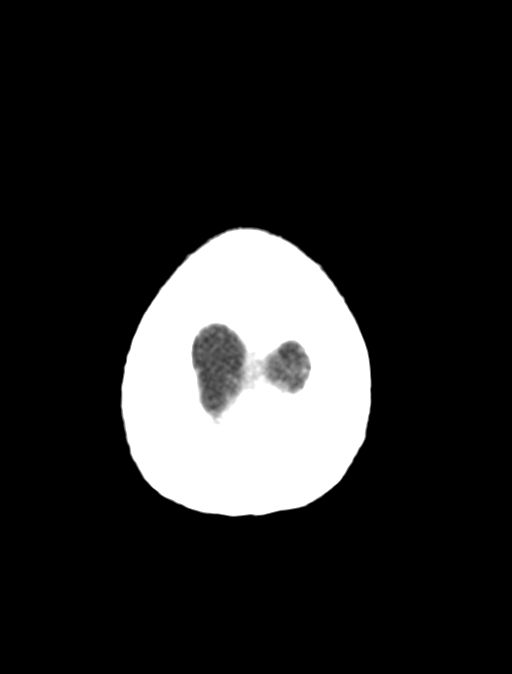

[Series 4: head without cor · coronal · non-contrast · 0.29mm/px · 3 of 67 slices shown]
[im 23/67  brain]
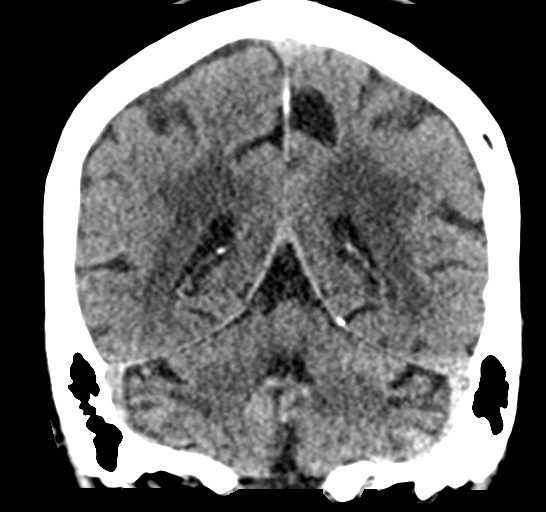
[im 30/67  brain]
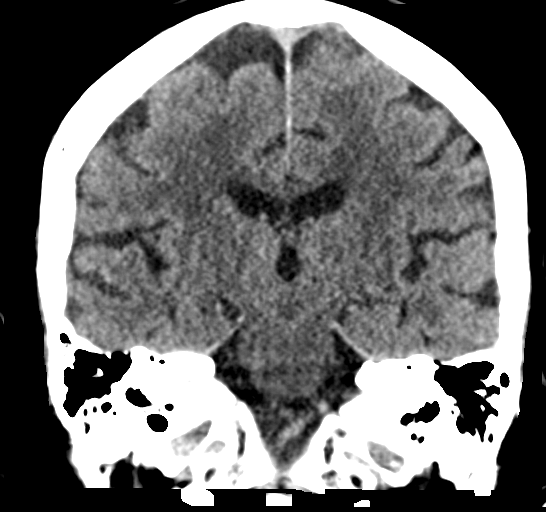
[im 37/67  brain]
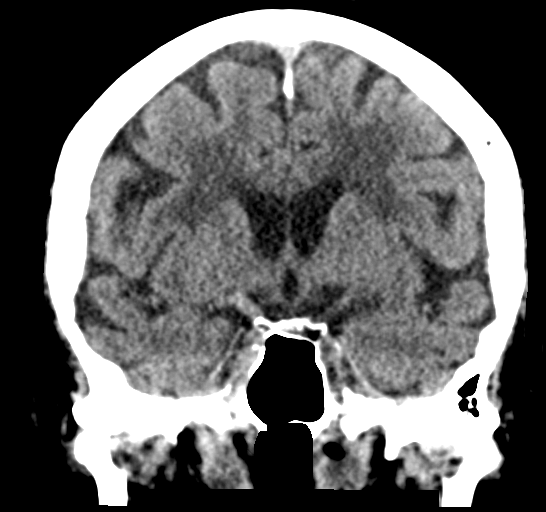

[Series 5: head without sag · sagittal · non-contrast · 0.29mm/px · 3 of 54 slices shown]
[im 18/54  brain]
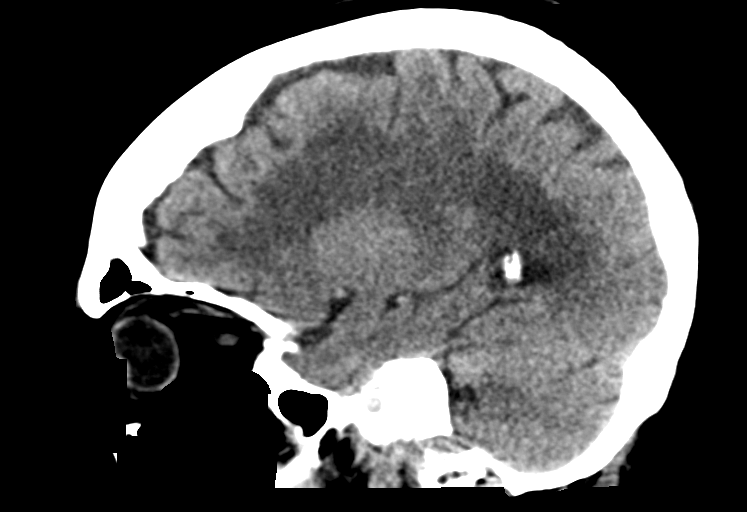
[im 27/54  brain]
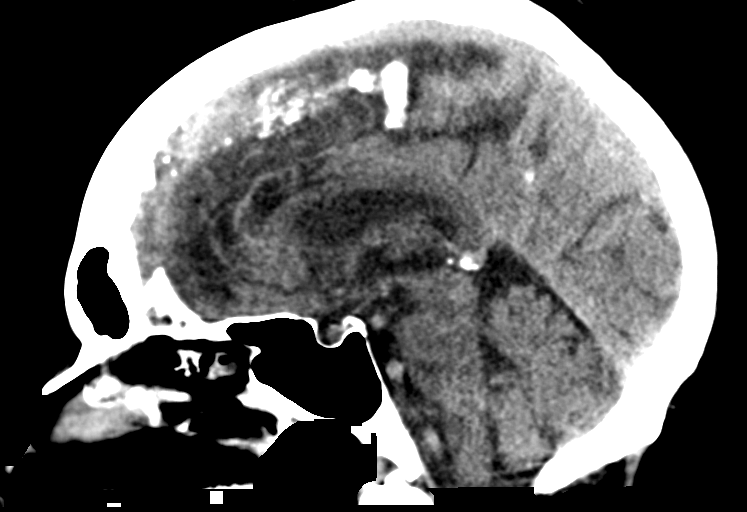
[im 36/54  brain]
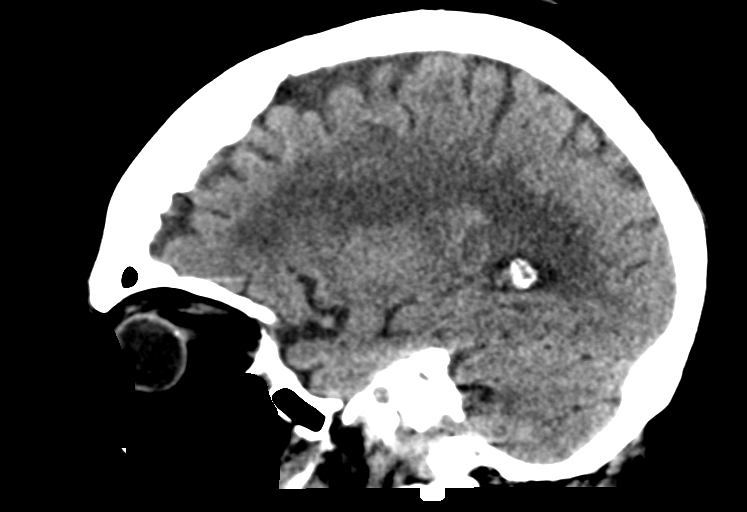

[13 of 47 positions shown; findings below may reference images not displayed]

FINDINGS: Brain: No acute territorial infarction, intracranial hemorrhage or
extra-axial fluid collection. No focal mass, mass effect or midline
shift. Moderate-to-marked periventricular confluent white matter
hypodensity, likely small vessel disease, unchanged. Mild atrophy.
Ventricles similar in size and morphology.

Vascular: No hyperdense vessels.  Carotid artery calcifications.

Skull: Mild heterogeneous attenuation likely osteopenia. Mastoid air
cells clear. No fracture. A dense bony lesion off the left frontal
bone is unchanged. There is hyperostosis.

Sinuses/Orbits: Mucosal thickening within the ethmoid sinuses. No
acute orbital abnormality.

Other: None
IMPRESSION: 1. No CT evidence for acute intracranial abnormality.
2. Moderate-to-marked periventricular white matter hypodensity,
small vessel disease.

## 2016-04-09 MED ORDER — SODIUM CHLORIDE 0.9 % IV BOLUS (SEPSIS)
1000.0000 mL | Freq: Once | INTRAVENOUS | Status: AC
  Administered 2016-04-09: 1000 mL via INTRAVENOUS

## 2016-04-09 MED ORDER — DEXTROSE 5 % IV SOLN
1.0000 g | Freq: Once | INTRAVENOUS | Status: AC
  Administered 2016-04-09: 1 g via INTRAVENOUS
  Filled 2016-04-09: qty 10

## 2016-04-09 NOTE — ED Provider Notes (Signed)
[place] DEPT Provider Note   CSN: [id number] Arrival date & time: 04/09/16  1930     History   Chief Complaint Chief Complaint  Patient presents with  . Fall    HPI Charlotte Castro is a 82 y.o. female.  HPI  Pt slipped getting out of the bed yesterday and slid down the mattress.  She was not able to get up.  Family checked on her today and found her on the ground.  SHe had been scooting around but was not able to call anyone. She has been nauseated.  No vomiting or diarrhea.  No burning with urination.  She does have incontinence.  She denies any injury from the fall. Past Medical History:  Diagnosis Date  . Atrial fibrillation ([place])   . Atrial flutter ([place])    typical appearing  . Atrial tachycardia ([place])    ablated 11/17/10  by JA  from the [facility] of the aorta  . Dizziness    chronic and of an unclear etiology  . Gastritis   . GERD (gastroesophageal reflux disease)   . HTN (hypertension)   . Hyperlipemia   . Hypothyroidism   . Obesity   . Osteoporosis   . Vitamin D deficiency     Patient Active Problem List   Diagnosis Date Noted  . Dehydration   . Atrial fibrillation with RVR ([place]) 10/09/2015  . Nausea & vomiting 10/09/2015  . Acute on chronic diastolic congestive heart failure ([place]) 09/24/2014  . Fatigue 03/29/2014  . Essential hypertension, benign 01/18/2014  . Long term current use of anticoagulant therapy 01/18/2014  . Encounter for therapeutic drug monitoring 07/13/2013  . Bradycardia 09/16/2012  . Atrial fibrillation ([place]) 06/10/2011  . Atrial flutter ([place]) 12/24/2010  . Dizziness 12/24/2010    Past Surgical History:  Procedure Laterality Date  . ATRIAL ABLATION SURGERY  11/17/10   Atrial tachycardia arising from [place] of the aorta ablated by JA    OB History    No data available       Home Medications    Prior to Admission medications   Medication Sig Start Date End Date Taking? Authorizing Provider  acetaminophen (TYLENOL) 325 MG tablet  Take 325 mg by mouth every 4 (four) hours as needed for headache (pain). Reported on 10/04/2015   Yes Historical Provider, MD  Calcium Carbonate-Vitamin D (CALCIUM-VITAMIN D) 600-200 MG-UNIT CAPS Take 1 capsule by mouth daily. Reported on 10/04/2015   Yes Historical Provider, MD  Cholecalciferol (VITAMIN D3) 1000 UNITS CAPS Take 1,000 Units by mouth daily. Reported on 10/04/2015   Yes Historical Provider, MD  clonazePAM (KLONOPIN) 0.5 MG tablet Take 0.25-0.5 mg by mouth 2 (two) times daily. For tremors   Yes Historical Provider, MD  levothyroxine (SYNTHROID, LEVOTHROID) 88 MCG tablet Take 88 mcg by mouth daily before breakfast.    Yes Historical Provider, MD  metoprolol (LOPRESSOR) 50 MG tablet Take 1 tablet (50 mg total) by mouth 2 (two) times daily. 10/04/15  Yes Forde Dandy, MD  Multiple Vitamin (MULTIVITAMIN WITH MINERALS) TABS tablet Take 1 tablet by mouth daily.   Yes Historical Provider, MD  OVER THE COUNTER MEDICATION Place 1 drop into both eyes daily as needed (dry eyes). Over the counter lubricating eye drop   Yes Historical Provider, MD  warfarin (COUMADIN) 5 MG tablet TAKE AS DIRECTED BY COUMADIN CLINIC Patient taking differently: Take 2.5mg  on Sunday, Tuesday and Thursday. All other days take 5mg . 03/11/16  Yes Jettie Booze, MD  Family History Family History  Problem Relation Age of Onset  . Heart attack Father   . Hypertension Father     Social History Social History  Substance Use Topics  . Smoking status: Never Smoker  . Smokeless tobacco: Never Used  . Alcohol use No     Allergies   Iodinated diagnostic agents   Review of Systems Review of Systems  Constitutional: Negative for fever.  Respiratory: Negative for shortness of breath.   Cardiovascular: Negative for chest pain.  Gastrointestinal: Negative for abdominal pain.  Genitourinary: Negative for dysuria.  Musculoskeletal: Negative for back pain.  Skin: Negative for rash.  Neurological: Positive for  tremors.  All other systems reviewed and are negative.    Physical Exam Updated Vital Signs BP 175/89   Pulse 109   Temp 97.6 F (36.4 C) (Oral)   Resp 17   SpO2 98%   Physical Exam  Constitutional: No distress.  Disheveled  HENT:  Head: Normocephalic and atraumatic.  Right Ear: External ear normal.  Left Ear: External ear normal.  Eyes: Conjunctivae are normal. Right eye exhibits no discharge. Left eye exhibits no discharge. No scleral icterus.  Neck: Neck supple. No tracheal deviation present.  Cardiovascular: Normal rate, regular rhythm and intact distal pulses.   Pulmonary/Chest: Effort normal and breath sounds normal. No stridor. No respiratory distress. She has no wheezes. She has no rales.  Abdominal: Soft. Bowel sounds are normal. She exhibits no distension. There is no tenderness. There is no rebound and no guarding.  Musculoskeletal: She exhibits no edema or tenderness.  Neurological: She is alert. She displays tremor. No cranial nerve deficit (no facial droop, extraocular movements intact, no slurred speech) or sensory deficit. She exhibits normal muscle tone. She displays no seizure activity. GCS eye subscore is 4. GCS verbal subscore is 5. GCS motor subscore is 6.  Generalized weakness but able to sit up in bed, equal movement all 4 extremities  Skin: Skin is warm and dry. No rash noted.  Poor hygiene   Psychiatric: She has a normal mood and affect.  Nursing note and vitals reviewed.    ED Treatments / Results  Labs (all labs ordered are listed, but only abnormal results are displayed) Labs Reviewed  PROTIME-INR - Abnormal; Notable for the following:       Result Value   Prothrombin Time 16.6 (*)    All other components within normal limits  CBC - Abnormal; Notable for the following:    WBC 15.6 (*)    Hemoglobin 16.2 (*)    All other components within normal limits  DIFFERENTIAL - Abnormal; Notable for the following:    Neutro Abs 13.6 (*)    Monocytes  Absolute 1.1 (*)    All other components within normal limits  COMPREHENSIVE METABOLIC PANEL - Abnormal; Notable for the following:    Glucose, Bld 120 (*)    Total Bilirubin 2.1 (*)    All other components within normal limits  URINALYSIS, ROUTINE W REFLEX MICROSCOPIC (NOT AT [facility]) - Abnormal; Notable for the following:    Color, Urine AMBER (*)    APPearance CLOUDY (*)    Hgb urine dipstick MODERATE (*)    Ketones, ur 40 (*)    Nitrite POSITIVE (*)    Leukocytes, UA SMALL (*)    All other components within normal limits  CK - Abnormal; Notable for the following:    Total CK 483 (*)    All other components within normal limits  URINE  MICROSCOPIC-ADD ON - Abnormal; Notable for the following:    Squamous Epithelial / LPF 0-5 (*)    Bacteria, UA MANY (*)    All other components within normal limits  URINE CULTURE  ETHANOL  APTT  I-STAT TROPOININ, ED    EKG  EKG Interpretation  Date/Time:  Friday April 09 2016 21:12:03 EDT Ventricular Rate:  105 PR Interval:    QRS Duration: 88 QT Interval:  350 QTC Calculation: 463 R Axis:   37 Text Interpretation:  Atrial fibrillation Probable anteroseptal infarct, old Borderline repolarization abnormality Baseline wander in lead(s) V3 No significant change since last tracing Confirmed by Josafat Enrico  MD-J, Sharica Roedel [id number]) on 04/09/2016 9:17:16 PM       Radiology Dg Chest 2 View  Result Date: 04/09/2016 CLINICAL DATA:  Hypertension, patient found on floor after fall EXAM: CHEST  2 VIEW COMPARISON:  10/09/2015. FINDINGS: There is no focal infiltrate or effusion. Stable mild enlargement of the cardiomediastinal. There is no pneumothorax. There is a large hiatal hernia again evident. Bones appear osteopenic. IMPRESSION: 1. No acute infiltrate or edema. 2. Large hiatal hernia 3. Mild cardiomegaly without overt failure. Electronically Signed   By: Donavan Foil M.D.   On: 04/09/2016 20:52   Ct Head Wo Contrast  Result Date: 04/09/2016 CLINICAL  DATA:  Frontal headache EXAM: CT HEAD WITHOUT CONTRAST TECHNIQUE: Contiguous axial images were obtained from the base of the skull through the vertex without intravenous contrast. COMPARISON:  08/07/2012, 07/04/2012 FINDINGS: Brain: No acute territorial infarction, intracranial hemorrhage or extra-axial fluid collection. No focal mass, mass effect or midline shift. Moderate-to-marked periventricular confluent white matter hypodensity, likely small vessel disease, unchanged. Mild atrophy. Ventricles similar in size and morphology. Vascular: No hyperdense vessels.  Carotid artery calcifications. Skull: Mild heterogeneous attenuation likely osteopenia. Mastoid air cells clear. No fracture. A dense bony lesion off the left frontal bone is unchanged. There is hyperostosis. Sinuses/Orbits: Mucosal thickening within the ethmoid sinuses. No acute orbital abnormality. Other: None IMPRESSION: 1. No CT evidence for acute intracranial abnormality. 2. Moderate-to-marked periventricular white matter hypodensity, small vessel disease. Electronically Signed   By: Donavan Foil M.D.   On: 04/09/2016 23:07    Procedures Procedures (including critical care time)  Medications Ordered in ED Medications  cefTRIAXone (ROCEPHIN) 1 g in dextrose 5 % 50 mL IVPB (not administered)  sodium chloride 0.9 % bolus 1,000 mL (0 mLs Intravenous Stopped 04/09/16 2223)     Initial Impression / Assessment and Plan / ED Course  I have reviewed the triage vital signs and the nursing notes.  Pertinent labs & imaging results that were available during my care of the patient were reviewed by me and considered in my medical decision making (see chart for details).  Clinical Course   Lab tests show a uti, mild CK elevation.  Pt was down on the ground for over 24 hours.  Will start IV abx, send urine culture.  Admit for further treatment.  Final Clinical Impressions(s) / ED Diagnoses   Final diagnoses:  None    New Prescriptions New  Prescriptions   No medications on file     Dorie Rank, MD 04/09/16 2311

## 2016-04-09 NOTE — ED Triage Notes (Signed)
Patient comes from home after being found by son on the floor.  Patient states she fell around 6 hours ago.  No LOC and no obvious injuries.  Patient A&Ox4.  No complaints of pain at this time. Patient is on blood thinners.

## 2016-04-09 NOTE — ED Notes (Signed)
Patient transported to CT 

## 2016-04-09 NOTE — ED Notes (Signed)
IN and Out cath procedure complete sterile technique utilized sample sent off to lab

## 2016-04-10 ENCOUNTER — Encounter (HOSPITAL_COMMUNITY): Payer: Self-pay | Admitting: Family Medicine

## 2016-04-10 DIAGNOSIS — R748 Abnormal levels of other serum enzymes: Secondary | ICD-10-CM | POA: Diagnosis present

## 2016-04-10 DIAGNOSIS — I10 Essential (primary) hypertension: Secondary | ICD-10-CM

## 2016-04-10 DIAGNOSIS — I482 Chronic atrial fibrillation: Secondary | ICD-10-CM

## 2016-04-10 DIAGNOSIS — N390 Urinary tract infection, site not specified: Secondary | ICD-10-CM

## 2016-04-10 DIAGNOSIS — R531 Weakness: Secondary | ICD-10-CM

## 2016-04-10 DIAGNOSIS — I5032 Chronic diastolic (congestive) heart failure: Secondary | ICD-10-CM

## 2016-04-10 DIAGNOSIS — E860 Dehydration: Secondary | ICD-10-CM

## 2016-04-10 LAB — COMPREHENSIVE METABOLIC PANEL
ALBUMIN: 3.1 g/dL — AB (ref 3.5–5.0)
ALK PHOS: 63 U/L (ref 38–126)
ALT: 20 U/L (ref 14–54)
AST: 35 U/L (ref 15–41)
Anion gap: 8 (ref 5–15)
BUN: 13 mg/dL (ref 6–20)
CHLORIDE: 108 mmol/L (ref 101–111)
CO2: 24 mmol/L (ref 22–32)
Calcium: 8.5 mg/dL — ABNORMAL LOW (ref 8.9–10.3)
Creatinine, Ser: 0.78 mg/dL (ref 0.44–1.00)
GFR calc Af Amer: >60 mL/min (ref >60)
GFR calc non Af Amer: >60 mL/min (ref >60)
Glucose, Bld: 102 mg/dL — ABNORMAL HIGH (ref 65–99)
Potassium: 4.1 mmol/L (ref 3.5–5.1)
SODIUM: 140 mmol/L (ref 135–145)
TOTAL PROTEIN: 5.7 g/dL — AB (ref 6.5–8.1)
Total Bilirubin: 1.9 mg/dL — ABNORMAL HIGH (ref 0.3–1.2)

## 2016-04-10 LAB — CBC
HCT: 39.7 % (ref 36.0–46.0)
HEMOGLOBIN: 14.2 g/dL (ref 12.0–15.0)
MCH: 32.9 pg (ref 26.0–34.0)
MCHC: 35.8 g/dL (ref 30.0–36.0)
MCV: 92.1 fL (ref 78.0–100.0)
PLATELETS: 150 10*3/uL (ref 150–400)
RBC: 4.31 MIL/uL (ref 3.87–5.11)
RDW: 13.3 % (ref 11.5–15.5)
WBC: 12.8 10*3/uL — AB (ref 4.0–10.5)

## 2016-04-10 LAB — TSH: TSH: 1.455 u[IU]/mL (ref 0.350–4.500)

## 2016-04-10 LAB — CK: CK TOTAL: 361 U/L — AB (ref 38–234)

## 2016-04-10 LAB — PROTIME-INR
INR: 1.44
PROTHROMBIN TIME: 17.6 s — AB (ref 11.4–15.2)

## 2016-04-10 LAB — LACTIC ACID, PLASMA: LACTIC ACID, VENOUS: 1.3 mmol/L (ref 0.5–1.9)

## 2016-04-10 MED ORDER — CLONAZEPAM 0.5 MG PO TABS
0.2500 mg | ORAL_TABLET | Freq: Two times a day (BID) | ORAL | Status: DC
  Administered 2016-04-10 – 2016-04-13 (×8): 0.5 mg via ORAL
  Filled 2016-04-10 (×8): qty 1

## 2016-04-10 MED ORDER — SODIUM CHLORIDE 0.9 % IV SOLN
INTRAVENOUS | Status: AC
  Administered 2016-04-10 (×2): via INTRAVENOUS

## 2016-04-10 MED ORDER — ACETAMINOPHEN 650 MG RE SUPP: 650.0000 mg | Freq: Four times a day (QID) | RECTAL | Status: DC | PRN

## 2016-04-10 MED ORDER — SODIUM CHLORIDE 0.9 % IV SOLN
INTRAVENOUS | Status: AC
  Administered 2016-04-10 (×2): via INTRAVENOUS

## 2016-04-10 MED ORDER — ONDANSETRON HCL 4 MG/2ML IJ SOLN
4.0000 mg | Freq: Four times a day (QID) | INTRAMUSCULAR | Status: DC | PRN
  Administered 2016-04-11 – 2016-04-13 (×3): 4 mg via INTRAVENOUS
  Filled 2016-04-10 (×3): qty 2

## 2016-04-10 MED ORDER — ENSURE ENLIVE PO LIQD
237.0000 mL | Freq: Two times a day (BID) | ORAL | Status: DC
  Administered 2016-04-10 – 2016-04-13 (×6): 237 mL via ORAL

## 2016-04-10 MED ORDER — WARFARIN - PHARMACIST DOSING INPATIENT: Freq: Every day | Status: DC

## 2016-04-10 MED ORDER — METOPROLOL TARTRATE 50 MG PO TABS
50.0000 mg | ORAL_TABLET | Freq: Two times a day (BID) | ORAL | Status: DC
  Administered 2016-04-10 – 2016-04-13 (×8): 50 mg via ORAL
  Filled 2016-04-10 (×8): qty 1

## 2016-04-10 MED ORDER — WARFARIN SODIUM 5 MG PO TABS
5.0000 mg | ORAL_TABLET | Freq: Once | ORAL | Status: AC
  Administered 2016-04-10: 5 mg via ORAL
  Filled 2016-04-10: qty 1

## 2016-04-10 MED ORDER — DEXTROSE 5 % IV SOLN
1.0000 g | INTRAVENOUS | Status: DC
Start: 2016-04-10 — End: 2016-04-12
  Administered 2016-04-10 – 2016-04-11 (×2): 1 g via INTRAVENOUS
  Filled 2016-04-10 (×3): qty 10

## 2016-04-10 MED ORDER — ACETAMINOPHEN 325 MG PO TABS
650.0000 mg | ORAL_TABLET | Freq: Four times a day (QID) | ORAL | Status: DC | PRN
  Administered 2016-04-10 – 2016-04-13 (×2): 650 mg via ORAL
  Filled 2016-04-10 (×2): qty 2

## 2016-04-10 MED ORDER — ONDANSETRON HCL 4 MG PO TABS
4.0000 mg | ORAL_TABLET | Freq: Four times a day (QID) | ORAL | Status: DC | PRN
  Administered 2016-04-10: 4 mg via ORAL
  Filled 2016-04-10: qty 1

## 2016-04-10 MED ORDER — IBUPROFEN 400 MG PO TABS
400.0000 mg | ORAL_TABLET | Freq: Four times a day (QID) | ORAL | Status: DC | PRN
  Administered 2016-04-12: 400 mg via ORAL
  Filled 2016-04-10: qty 1

## 2016-04-10 MED ORDER — LEVOTHYROXINE SODIUM 88 MCG PO TABS
88.0000 ug | ORAL_TABLET | Freq: Every day | ORAL | Status: DC
  Administered 2016-04-10 – 2016-04-13 (×4): 88 ug via ORAL
  Filled 2016-04-10 (×4): qty 1

## 2016-04-10 MED ORDER — SODIUM CHLORIDE 0.9% FLUSH
3.0000 mL | Freq: Two times a day (BID) | INTRAVENOUS | Status: DC
  Administered 2016-04-11 – 2016-04-13 (×3): 3 mL via INTRAVENOUS

## 2016-04-10 MED ORDER — SENNOSIDES-DOCUSATE SODIUM 8.6-50 MG PO TABS: 1.0000 | ORAL_TABLET | Freq: Every evening | ORAL | Status: DC | PRN

## 2016-04-10 NOTE — Evaluation (Signed)
Physical Therapy Evaluation Patient Details Name: Charlotte Castro MRN: [id number] DOB: [birth year] Today's Date: 04/10/2016   History of Present Illness  pt is an 82 y/o female with mh of HTN, Afib, tremors who presents with fall and supposedly was scoot around for about or in the floor for 24 hours and unable to get up in one of her chairs.    Clinical Impression  Pt admitted with/for fall and unable to get up.  Found down on the floor and unable to get up by herself.  Pt currently limited functionally due to the problems listed. ( See problems list.)   Pt will benefit from PT to maximize function and safety in order to get ready for next venue listed below..     Follow Up Recommendations SNF;Other (comment) (could use SNF for S-T until recond.  She will likely refuse)    Equipment Recommendations  None recommended by PT    Recommendations for Other Services       Precautions / Restrictions Precautions Precautions: Fall      Mobility  Bed Mobility Overal bed mobility: Modified Independent             General bed mobility comments: mild struggle but no assist needed  Transfers Overall transfer level: Needs assistance Equipment used: Rolling walker (2 wheeled);None Transfers: Sit to/from Stand Sit to Stand: Min guard         General transfer comment: no assist needed, but transfer seemed painful and effortful  Ambulation/Gait Ambulation/Gait assistance: Supervision Ambulation Distance (Feet): 200 Feet Assistive device: Rolling walker (2 wheeled) Gait Pattern/deviations: Step-through pattern   Gait velocity interpretation: Below normal speed for age/gender General Gait Details: steady, but easily fatigued.  Cues for posture and better position in the RW  Stairs            Wheelchair Mobility    Modified Rankin (Stroke Patients Only)       Balance Overall balance assessment: Needs assistance Sitting-balance support: No upper extremity  supported Sitting balance-[name]: Good     Standing balance support: No upper extremity supported Standing balance-[name]: Fair                               Pertinent Vitals/Pain Pain Assessment: Faces Faces Pain Scale: Hurts little more Pain Location: knees Pain Descriptors / Indicators: Sore Pain Intervention(s): Monitored during session;Repositioned    Home Living Family/patient expects to be discharged to:: Private residence Living Arrangements: Alone Available Help at Discharge: Family;Available PRN/intermittently Type of Home: House Home Access: Ramped entrance     Home Layout: One level Home Equipment: Walker - 2 wheels;Walker - 4 wheels;Cane - single point;Bedside commode;Wheelchair - manual;Shower seat      Prior Function Level of Independence: Independent with assistive device(s)         Comments: Pt uses cane inside house and RW outside of house (pt is very vague about her recent level of function)     Hand Dominance        Extremity/Trunk Assessment   Upper Extremity Assessment: Overall WFL for tasks assessed           Lower Extremity Assessment: Overall WFL for tasks assessed (grossly weak and sore at 4/5)         Communication   Communication: No difficulties  Cognition Arousal/Alertness: Awake/alert Behavior During Therapy: WFL for tasks assessed/performed Overall Cognitive Status: Within Functional Limits for tasks assessed  General Comments      Exercises     Assessment/Plan    PT Assessment Patient needs continued PT services  PT Problem List Decreased strength;Decreased balance;Decreased mobility;Decreased knowledge of use of DME;Decreased activity tolerance          PT Treatment Interventions DME instruction;Functional mobility training;Therapeutic activities;Balance training;Stair training;Gait training;Patient/family education    PT Goals (Current goals can be found  in the Care Plan section)  Acute Rehab PT Goals Patient Stated Goal: get home, I've got stray cats to feed PT Goal Formulation: With patient Time For Goal Achievement: 04/24/16    Frequency Min 3X/week   Barriers to discharge Decreased caregiver support      Co-evaluation               End of Session   Activity Tolerance: Patient tolerated treatment well;Patient limited by pain Patient left: in chair;with chair alarm set;with call bell/phone within reach Nurse Communication: Mobility status         Time: [id number] PT Time Calculation (min) (ACUTE ONLY): 33 min   Charges:   PT Evaluation $PT Eval Moderate Complexity: 1 Procedure PT Treatments $Gait Training: 8-22 mins   PT G Codes:        Dorma Altman, Tessie Fass 04/10/2016, 3:30 PM 04/10/2016  Donnella Sham, PT [phone]  (pager)

## 2016-04-10 NOTE — Progress Notes (Signed)
ANTICOAGULATION CONSULT NOTE - Initial Consult  Pharmacy Consult for Warfarin  Indication: atrial fibrillation  Allergies  Allergen Reactions  . Iodinated Diagnostic Agents Shortness Of Breath    headache   Vital Signs: Temp: 97.6 F (36.4 C) (10/27 1937) Temp Source: Oral (10/27 1937) BP: 164/75 (10/28 0030) Pulse Rate: 111 (10/28 0030)  Labs:  Recent Labs  04/09/16 2057  HGB 16.2*  HCT 45.0  PLT 167  APTT 29  LABPROT 16.6*  INR 1.33  CREATININE 0.80  CKTOTAL 483*    CrCl cannot be calculated (Unknown ideal weight.).   Medical History: Past Medical History:  Diagnosis Date  . Atrial fibrillation ([place])   . Atrial flutter ([place])    typical appearing  . Atrial tachycardia ([place])    ablated 11/17/10  by JA  from the [facility] of the aorta  . Dizziness    chronic and of an unclear etiology  . Gastritis   . GERD (gastroesophageal reflux disease)   . HTN (hypertension)   . Hyperlipemia   . Hypothyroidism   . Obesity   . Osteoporosis   . Vitamin D deficiency    Assessment: 82 y/o F here after being found down in the floor yesterday, warfarin PTA for afib, INR is low at 1.33, CBC good, renal function good. CT head with no bleed.   Goal of Therapy:  INR 2-3 Monitor platelets by anticoagulation protocol: Yes   Plan:  -Warfarin 5 mg PO x 1 now -Daily PT/INR -Monitor for bleeding  [name] 04/10/2016,1:14 AM

## 2016-04-10 NOTE — H&P (Signed)
History and Physical  Patient Name: TRUST AHEARN     [id number]    DOB: Jan 01, 1933    DOA: 04/09/2016 PCP: Antony Blackbird, MD   Patient coming from: Home  Chief Complaint: Fall  HPI: ALIAHNA WEINGARTEN is a 82 y.o. female with a past medical history significant for HTN, Afib on warfarin, hypothyroidism, Parkinson's (?patient disputes, is not on meds), hiatal hernia and anxiety who presents with fall.  The patient was in her usual state of health until the last few days, when her son notes she has been more weak than usual, he thought dehydrated because she doesn't drink enough water as has been recommended by her doctors.  Then Thursday during the day, the patient was progressively weaker and at one point when she tried to get out of bed she just "slid down to the floor" without striking her head, and without losing consciousness but without being able to get up either.  She didn't have her phone, so (reportedly) she crawled around on the floor for 24 hours until this afternoon when family came over and found her on the floor, sluggish, with slurred speech, and having soiled herself and so they thought maybe she had a stroke and brought her to the ER.  ED course: -Afebrile, heart rate 110s in Afib, respirations and pulse ox normal, BP 175/90 -Na 139, K 4.0, Cr 0.8, WBC 15.6K without shift, Hgb 16.2 -UA with nitrites and hematuria microscopic and ketones and bacteria -CK ~500 -CXR without pneumonia -ECG showed Afib without ischemic changes -INR subtherapeutic  The patient lives at home alone.  She drives still (short distances) and has no dementia and is independent for all ADLs.       ROS: Review of Systems  Constitutional: Negative for chills and fever.  Respiratory: Negative for cough, sputum production and shortness of breath.   Gastrointestinal: Positive for nausea. Negative for abdominal pain, constipation, diarrhea and vomiting.  Genitourinary: Negative for dysuria, frequency  (chronically incontinent) and urgency.  Neurological: Positive for tremors (chronic) and weakness. Negative for dizziness, speech change (son noted, but patient denies), focal weakness, seizures and loss of consciousness.  All other systems reviewed and are negative.         Past Medical History:  Diagnosis Date  . Atrial fibrillation ([place])   . Atrial flutter ([place])    typical appearing  . Atrial tachycardia ([place])    ablated 11/17/10  by JA  [facility] of the aorta  . Dizziness    chronic and of an unclear etiology  . Gastritis   . GERD (gastroesophageal reflux disease)   . HTN (hypertension)   . Hyperlipemia   . Hypothyroidism   . Obesity   . Osteoporosis   . Vitamin D deficiency     Past Surgical History:  Procedure Laterality Date  . ATRIAL ABLATION SURGERY  11/17/10   Atrial tachycardia arising from [facility] of the aorta ablated by JA    Social History: Patient lives alone.  The patient walks unassisted at baseline.  Still drives.  She does not smoke.    Allergies  Allergen Reactions  . Iodinated Diagnostic Agents Shortness Of Breath    headache    Family history: family history includes Heart attack in her father; Hypertension in her father.  Prior to Admission medications   Medication Sig Start Date End Date Taking? Authorizing Provider  acetaminophen (TYLENOL) 325 MG tablet Take 325 mg by mouth every 4 (four) hours as needed for headache (  pain). Reported on 10/04/2015   Yes Historical Provider, MD  Calcium Carbonate-Vitamin D (CALCIUM-VITAMIN D) 600-200 MG-UNIT CAPS Take 1 capsule by mouth daily. Reported on 10/04/2015   Yes Historical Provider, MD  Cholecalciferol (VITAMIN D3) 1000 UNITS CAPS Take 1,000 Units by mouth daily. Reported on 10/04/2015   Yes Historical Provider, MD  clonazePAM (KLONOPIN) 0.5 MG tablet Take 0.25-0.5 mg by mouth 2 (two) times daily. For tremors   Yes Historical Provider, MD  levothyroxine (SYNTHROID, LEVOTHROID) 88 MCG tablet Take 88 mcg by  mouth daily before breakfast.    Yes Historical Provider, MD  metoprolol (LOPRESSOR) 50 MG tablet Take 1 tablet (50 mg total) by mouth 2 (two) times daily. 10/04/15  Yes Forde Dandy, MD  Multiple Vitamin (MULTIVITAMIN WITH MINERALS) TABS tablet Take 1 tablet by mouth daily.   Yes Historical Provider, MD  OVER THE COUNTER MEDICATION Place 1 drop into both eyes daily as needed (dry eyes). Over the counter lubricating eye drop   Yes Historical Provider, MD  warfarin (COUMADIN) 5 MG tablet TAKE AS DIRECTED BY COUMADIN CLINIC Patient taking differently: Take 2.5mg  on Sunday, Tuesday and Thursday. All other days take 5mg . 03/11/16  Yes Jettie Booze, MD       Physical Exam: BP 158/97   Pulse 110   Temp 97.6 F (36.4 C) (Oral)   Resp 19   SpO2 97%  General appearance: Obese elderly adult female, alert and in no acute distress.   Eyes: Anicteric, conjunctiva pink, lids and lashes normal. PERRL.    ENT: No nasal deformity, discharge, epistaxis.  Hearing normal. OP tacky dry without lesions.   Neck: No neck masses.  Trachea midline.  No thyromegaly/tenderness. Lymph: No cervical or supraclavicular lymphadenopathy. Skin: Warm and dry.  No jaundice.  No suspicious rashes or lesions. Chronic venous stasis changes to lower extremities.  No evidence of cellulitis. Cardiac: Tachycardic, irregular, nl S1-S2, no murmurs appreciated.  Capillary refill is brisk.  JVP normal.  No LE edema.  Radial pulses 2+ and symmetric.  DP pulses diminished, symmetric. Respiratory: Normal respiratory rate and rhythm.  CTAB without rales or wheezes. Abdomen: Abdomen soft.  No TTP. No ascites, distension, hepatosplenomegaly.   MSK: No deformities or effusions.  No cyanosis or clubbing. Neuro: Cranial nerves 3-12 normal.  Speech is fluent.  Sensation intact to light touch. Muscle strength globally weak, unable to stand without assistance, needs assistance sitting up, strenght symmetric.   Romberg normal.  FTN normal and  symmetric.   Psych: Sensorium intact and responding to questions, slightly tired, attention normal.  Behavior appropriate.  Affect normal.  Judgment and insight appear soewhat poor.     Labs on Admission:  I have personally reviewed following labs and imaging studies: CBC:  Recent Labs Lab 04/09/16 2057  WBC 15.6*  NEUTROABS 13.6*  HGB 16.2*  HCT 45.0  MCV 93.6  PLT [id number]   Basic Metabolic Panel:  Recent Labs Lab 04/09/16 2057  NA 139  K 4.0  CL 106  CO2 24  GLUCOSE 120*  BUN 14  CREATININE 0.80  CALCIUM 9.4   GFR: CrCl cannot be calculated (Unknown ideal weight.).  Liver Function Tests:  Recent Labs Lab 04/09/16 2057  AST 39  ALT 21  ALKPHOS 75  BILITOT 2.1*  PROT 6.6  ALBUMIN 3.7   No results for input(s): LIPASE, AMYLASE in the last 168 hours. No results for input(s): AMMONIA in the last 168 hours. Coagulation Profile:  Recent Labs Lab 04/09/16 2057  INR 1.33   Cardiac Enzymes:  Recent Labs Lab 04/09/16 2057  CKTOTAL 483*   BNP (last 3 results) No results for input(s): PROBNP in the last 8760 hours. HbA1C: No results for input(s): HGBA1C in the last 72 hours. CBG: No results for input(s): GLUCAP in the last 168 hours. Lipid Profile: No results for input(s): CHOL, HDL, LDLCALC, TRIG, CHOLHDL, LDLDIRECT in the last 72 hours. Thyroid Function Tests: No results for input(s): TSH, T4TOTAL, FREET4, T3FREE, THYROIDAB in the last 72 hours. Anemia Panel: No results for input(s): VITAMINB12, FOLATE, FERRITIN, TIBC, IRON, RETICCTPCT in the last 72 hours. Sepsis Labs: Lactic acid pending Invalid input(s): PROCALCITONIN, LACTICIDVEN No results found for this or any previous visit (from the past 240 hour(s)).       Radiological Exams on Admission: Personally reviewed CXR shows hiatal hernia (old, known), no new opacity or pneumonia.  CT head report reviewed shows NAICP: Dg Chest 2 View  Result Date: 04/09/2016 CLINICAL DATA:  Hypertension,  patient found on floor after fall EXAM: CHEST  2 VIEW COMPARISON:  10/09/2015. FINDINGS: There is no focal infiltrate or effusion. Stable mild enlargement of the cardiomediastinal. There is no pneumothorax. There is a large hiatal hernia again evident. Bones appear osteopenic. IMPRESSION: 1. No acute infiltrate or edema. 2. Large hiatal hernia 3. Mild cardiomegaly without overt failure. Electronically Signed   By: Donavan Foil M.D.   On: 04/09/2016 20:52   Ct Head Wo Contrast  Result Date: 04/09/2016 CLINICAL DATA:  Frontal headache EXAM: CT HEAD WITHOUT CONTRAST TECHNIQUE: Contiguous axial images were obtained from the base of the skull through the vertex without intravenous contrast. COMPARISON:  08/07/2012, 07/04/2012 FINDINGS: Brain: No acute territorial infarction, intracranial hemorrhage or extra-axial fluid collection. No focal mass, mass effect or midline shift. Moderate-to-marked periventricular confluent white matter hypodensity, likely small vessel disease, unchanged. Mild atrophy. Ventricles similar in size and morphology. Vascular: No hyperdense vessels.  Carotid artery calcifications. Skull: Mild heterogeneous attenuation likely osteopenia. Mastoid air cells clear. No fracture. A dense bony lesion off the left frontal bone is unchanged. There is hyperostosis. Sinuses/Orbits: Mucosal thickening within the ethmoid sinuses. No acute orbital abnormality. Other: None IMPRESSION: 1. No CT evidence for acute intracranial abnormality. 2. Moderate-to-marked periventricular white matter hypodensity, small vessel disease. Electronically Signed   By: Donavan Foil M.D.   On: 04/09/2016 23:07    EKG: Independently reviewed. Rate 105, QTc 463, Afib.  No ishchemic changes.  No change from previous.          Assessment/Plan  1. Weakness:  Suspect this is from dehydration in setting of chronic deconditioning/clonazepam use, UTI with possible sepsis contributing.  Stroke and TIA doubted. -IV  fluids overnight -PT eval tomorrow -Check TSH    2. UTI, ?sepsis:  -Check lactic acid -IV fluids -Follow culture -Ceftriaxone  3. HTN:  -Continue metoprolol  4. Elevated CK:  Found down after 24 hours. -Trend CK  5. Afib:  CHADS2-VASc 5. On warfarin and metoprolol -Continue warfarin, dosed per pharmacy -Continue metoprolol  6. Chronic diastolic CHF:  Appears clinically hypovolemic.  Has furosemide twice weekly on outside med list, she can't confirm.  7. Tremor: -Continue clonazepam, consider reducing dose  8. Hypothyroidism: -Continue levothyroxine         DVT prophylaxis: N/a on warfarin  Code Status: FULL  Family Communication: Son by phone  Disposition Plan: Anticipate IV fluids and antibiotics and rule out sepsis.  PT eval for weakness and monitor CK and bilirubin. Consults called: None  Admission status: INPATIENT        Medical decision making: Patient seen at 11:40 PM on 04/09/2016.  The patient was discussed with Dr. Tomi Bamberger.  What exists of the patient's chart was reviewed in depth and summarized above.  Clinical condition: stable.        Edwin Dada [facility] Pager [phone]      At the time of admission, it appears that the appropriate admission status for this patient is INPATIENT. This is judged to be reasonable and necessary in order to provide the required intensity of service to ensure the patient's safety given the presenting symptoms, physical exam findings, and initial radiographic and laboratory data in the context of their chronic comorbidities.  Together, these circumstances are felt to place her/him at high risk for further clinical deterioration threatening life, limb, or organ. The following factors support the admission status of inpatient:   A. The patient's presenting symptoms include fall, weakness, inability to stand, slurred speech B. The worrisome physical exam findings include global weakness,  tachycardia and atrial fibrillation C. The initial radiographic and laboratory data are worrisome because of leukocytosis, elevated bilirubin, pyuria, elevated CK D. The chronic co-morbidities include Afib on warfarin, hypothyroidism, hypertension, Parkinson's E. Patient requires inpatient status due to high intensity of service, high risk for further deterioration and high frequency of surveillance required because of this acute illness that poses a threat to life or bodily function. F. I certify that at the point of admission it is my clinical judgment that the patient will require inpatient hospital care spanning beyond 2 midnights from the point of admission and that early discharge would result in unnecessary risk of decompensation and readmission or threat to life, limb or bodily function.

## 2016-04-10 NOTE — Progress Notes (Signed)
Nutrition Brief Note  Patient identified on the Malnutrition Screening Tool (MST) Report  Wt Readings from Last 15 Encounters:  04/10/16 224 lb (101.6 kg)  01/15/16 226 lb (102.5 kg)  10/13/15 215 lb 12.8 oz (97.9 kg)  10/04/15 230 lb (104.3 kg)  07/03/15 226 lb 12.8 oz (102.9 kg)  03/03/15 231 lb (104.8 kg)  02/06/15 238 lb 6.4 oz (108.1 kg)  10/24/14 243 lb (110.2 kg)  09/24/14 240 lb (108.9 kg)  06/25/14 242 lb 1.6 oz (109.8 kg)  03/29/14 238 lb (108 kg)  01/18/14 234 lb (106.1 kg)  10/23/12 227 lb (103 kg)  10/11/12 233 lb (105.7 kg)  09/14/12 230 lb (104.3 kg)   Body mass index is 33.08 kg/m. Patient meets criteria for Obese based on current BMI.   Pt reports "I am eating like I always eat". She notes she follows a softer diet due to hiatal hernia. Her breakfast tray is seen at bedside, mostly consumed. She is sipping on an Ensure.   She does not know her UBW. She says she just feels smaller. She felt she lost weight from an admission 4 months ago. Per chart review, her weight is overall stable, if not slightly up since this spring  Current diet order is Heart Healthy, patient consumed approximately 75% of breakfast.   No nutrition interventions warranted at this time. If nutrition issues arise, please consult RD.   Burtis Junes RD, LDN, CNSC Clinical Nutrition Pager: [phone] 04/10/2016 10:29 AM

## 2016-04-10 NOTE — Progress Notes (Signed)
PROGRESS NOTE    Charlotte Castro  [id number] DOB: 17-Sep-1932 DOA: 04/09/2016 PCP: Antony Blackbird, MD   Brief Narrative:  Charlotte Castro is a 82 y.o. female with a past medical history significant for HTN, Afib on warfarin, hypothyroidism, Parkinson's (?patient disputes, is not on meds), hiatal hernia and anxiety who presents with fall. The patient was in her usual state of health until the last few days, when her son notes she has been more weak than usual, he thought dehydrated because she doesn't drink enough water as has been recommended by her doctors. Then Thursday during the day, the patient was progressively weaker and at one point when she tried to get out of bed she just "slid down to the floor" without striking her head, and without losing consciousness but without being able to get up either.  She didn't have her phone, so (reportedly) she crawled around on the floor for 24 hours until this afternoon when family came over and found her on the floor, sluggish, with slurred speech, and having soiled herself and so they thought maybe she had a stroke and brought her to the ER for evaluation. She was worked up and admitted for Generalized Weakness and suspected UTI.   Assessment & Plan:   Principal Problem:   Weakness Active Problems:   Permanent atrial fibrillation (HCC)   Essential hypertension, benign   Dehydration   UTI (urinary tract infection)   Elevated CK   Chronic diastolic CHF (congestive heart failure) ([place])  1. Generalized Weakness likely from Dehydration in the Setting of suspected UTI with Fall out of bed:  -CT Head No CT evidence for acute intracranial abnormality. Moderate-to-marked periventricular white matter hypodensity, likely small vessel disease. -C/w IVF Rehydration -PT to Eval and Treat -TSH was 1.455 -No slurred Speech on my exam and no focal deficits. Patient has Lip Tremor -Will obtain a Mag and Phos Level  2. Sepsis 2/2 to suspected UTI -Patient was  Tachycardic, has elevated WBC at 15.6 and likely source of infection in Urine -Patient had Cloudy Urine with Many Bacteria, Small Leukocytes, Positive Nitrites, 0-5 WBC; Urine was foul smelling -Urine C/x Pending -C/w IVF at 75 mL/hr -Lactic Acid was 1.3 -Ceftriaxone IV Abx  3. HTN:  -Continue Metoprolol 50 mg po BID -Will Add Hydralazine if Necessary  4. Elevated CPK:  -Found down after 24 hours. -CPK went from 483 -> 361 -C/w IVF Rehydration  5. Atrial Fibrillation  -CHADS2-VASc 5.  -Continue Warfarin, dosed per pharmacy -Patient was subtherapeutic with PT of 17.6 and INR of 1.44 -Continue Metoprolol 50 mg po BID  6. Chronic Diastolic CHF:  -On Metoprolol -Has Furosemide twice weekly on outside med list and she says she no longer takes it.  -Strict I's and O's, Daily Weights,  7. Tremor: -Continue Clonazepam 0.25-0.5 mg BID  8. Hypothyroidism: -TSH was 1.455 -Continue Levothyroxine 88 mcg po Daily  DVT prophylaxis: Anticoagulated with Warfarin Code Status: Full Family Communication: No Family at bedside Disposition Plan: Pending PT Evaluation  Consultants:   None  Procedures: None  Antimicrobials: IV Ceftriaxone 04/09/16 ->   Subjective: Seen and examined at beside and states she slid out of bed. Denied any Cp, SOB, N/V or lightheadedness or dizziness.   Objective: Vitals:   04/10/16 0030 04/10/16 0117 04/10/16 0459 04/10/16 1004  BP: 164/75 (!) 149/77 120/60 135/60  Pulse: 111 (!) 124 84 90  Resp: 15 19 18    Temp:  98.4 F (36.9 C) 98.1 F (36.7 C)  TempSrc:  Oral    SpO2: 99% 99% 93% 98%  Weight:  101.6 kg (224 lb)    Height:  5\' 9"  (1.753 m)      Intake/Output Summary (Last 24 hours) at 04/10/16 1240 Last data filed at 04/10/16 0615  Gross per 24 hour  Intake          1664.58 ml  Output                0 ml  Net          1664.58 ml   Filed Weights   04/10/16 0117  Weight: 101.6 kg (224 lb)    Examination: Physical  Exam:  Constitutional: WN/WD elderly female, NAD and appears calm and comfortable Eyes: Lids and conjunctivae normal, sclerae anicteric  ENMT: External Ears, Nose appear normal. Grossly normal hearing.   Neck: Appears normal, supple, no cervical masses, normal ROM, no appreciable thyromegaly, no JVD Respiratory: Clear to auscultation bilaterally, no wheezing, rales, rhonchi or crackles. Normal respiratory effort and patient is not tachypenic. No accessory muscle use.  Cardiovascular: RRR, no murmurs / rubs / gallops. S1 and S2 auscultated. No extremity edema. 2+ pedal pulses.  Abdomen: Soft, non-tender, non-distended. No masses palpated. No appreciable hepatosplenomegaly. Bowel sounds positive x4.  GU: Deferred. Musculoskeletal: No clubbing / cyanosis of digits/nails. No joint deformity upper and lower extremities. No contractures. Skin: No rashes, lesions, ulcers. No induration; Warm and dry.  Neurologic: CN 2-12 grossly intact with no focal deficits. Sensation intact in all 4 Extremities. Generalized Decreased Strength in all 4 Extremities. Romberg sign cerebellar reflexes not assessed.  Psychiatric: Normal judgment and insight. Alert and oriented x 3. Normal mood and appropriate affect.   Data Reviewed: I have personally reviewed following labs and imaging studies  CBC:  Recent Labs Lab 04/09/16 2057 04/10/16 0137  WBC 15.6* 12.8*  NEUTROABS 13.6*  --   HGB 16.2* 14.2  HCT 45.0 39.7  MCV 93.6 92.1  PLT 167 [id number]   Basic Metabolic Panel:  Recent Labs Lab 04/09/16 2057 04/10/16 0137  NA 139 140  K 4.0 4.1  CL 106 108  CO2 24 24  GLUCOSE 120* 102*  BUN 14 13  CREATININE 0.80 0.78  CALCIUM 9.4 8.5*   GFR: Estimated Creatinine Clearance: 70 mL/min (by C-G formula based on SCr of 0.78 mg/dL). Liver Function Tests:  Recent Labs Lab 04/09/16 2057 04/10/16 0137  AST 39 35  ALT 21 20  ALKPHOS 75 63  BILITOT 2.1* 1.9*  PROT 6.6 5.7*  ALBUMIN 3.7 3.1*   No results for  input(s): LIPASE, AMYLASE in the last 168 hours. No results for input(s): AMMONIA in the last 168 hours. Coagulation Profile:  Recent Labs Lab 04/09/16 2057 04/10/16 0137  INR 1.33 1.44   Cardiac Enzymes:  Recent Labs Lab 04/09/16 2057 04/10/16 0137  CKTOTAL 483* 361*   BNP (last 3 results) No results for input(s): PROBNP in the last 8760 hours. HbA1C: No results for input(s): HGBA1C in the last 72 hours. CBG: No results for input(s): GLUCAP in the last 168 hours. Lipid Profile: No results for input(s): CHOL, HDL, LDLCALC, TRIG, CHOLHDL, LDLDIRECT in the last 72 hours. Thyroid Function Tests:  Recent Labs  04/10/16 0137  TSH 1.455   Anemia Panel: No results for input(s): VITAMINB12, FOLATE, FERRITIN, TIBC, IRON, RETICCTPCT in the last 72 hours. Sepsis Labs:  Recent Labs Lab 04/10/16 0136  LATICACIDVEN 1.3    No results found for this or any previous  visit (from the past 240 hour(s)).   Radiology Studies: Dg Chest 2 View  Result Date: 04/09/2016 CLINICAL DATA:  Hypertension, patient found on floor after fall EXAM: CHEST  2 VIEW COMPARISON:  10/09/2015. FINDINGS: There is no focal infiltrate or effusion. Stable mild enlargement of the cardiomediastinal. There is no pneumothorax. There is a large hiatal hernia again evident. Bones appear osteopenic. IMPRESSION: 1. No acute infiltrate or edema. 2. Large hiatal hernia 3. Mild cardiomegaly without overt failure. Electronically Signed   By: Donavan Foil M.D.   On: 04/09/2016 20:52   Ct Head Wo Contrast  Result Date: 04/09/2016 CLINICAL DATA:  Frontal headache EXAM: CT HEAD WITHOUT CONTRAST TECHNIQUE: Contiguous axial images were obtained from the base of the skull through the vertex without intravenous contrast. COMPARISON:  08/07/2012, 07/04/2012 FINDINGS: Brain: No acute territorial infarction, intracranial hemorrhage or extra-axial fluid collection. No focal mass, mass effect or midline shift. Moderate-to-marked  periventricular confluent white matter hypodensity, likely small vessel disease, unchanged. Mild atrophy. Ventricles similar in size and morphology. Vascular: No hyperdense vessels.  Carotid artery calcifications. Skull: Mild heterogeneous attenuation likely osteopenia. Mastoid air cells clear. No fracture. A dense bony lesion off the left frontal bone is unchanged. There is hyperostosis. Sinuses/Orbits: Mucosal thickening within the ethmoid sinuses. No acute orbital abnormality. Other: None IMPRESSION: 1. No CT evidence for acute intracranial abnormality. 2. Moderate-to-marked periventricular white matter hypodensity, small vessel disease. Electronically Signed   By: Donavan Foil M.D.   On: 04/09/2016 23:07   Scheduled Meds: . cefTRIAXone (ROCEPHIN)  IV  1 g Intravenous Q24H  . clonazePAM  0.25-0.5 mg Oral BID  . feeding supplement (ENSURE ENLIVE)  237 mL Oral BID BM  . levothyroxine  88 mcg Oral QAC breakfast  . metoprolol  50 mg Oral BID  . sodium chloride flush  3 mL Intravenous Q12H  . warfarin  5 mg Oral ONCE-1800  . Warfarin - Pharmacist Dosing Inpatient   Does not apply q1800   Continuous Infusions:    LOS: 1 day   Kerney Elbe, DO [facility] Pager [phone]  If 7PM-7AM, please contact night-coverage [email or web address] Password [email or web address] 04/10/2016, 12:40 PM

## 2016-04-10 NOTE — Progress Notes (Signed)
ANTICOAGULATION CONSULT NOTE - Follow-Up Consult   Pharmacy Consult for Warfarin  Indication: atrial fibrillation  Allergies  Allergen Reactions  . Iodinated Diagnostic Agents Shortness Of Breath    headache   Vital Signs: Temp: 98.1 F (36.7 C) (10/28 0459) Temp Source: Oral (10/28 0117) BP: 135/60 (10/28 1004) Pulse Rate: 90 (10/28 1004)  Labs:  Recent Labs  04/09/16 2057 04/10/16 0137  HGB 16.2* 14.2  HCT 45.0 39.7  PLT 167 150  APTT 29  --   LABPROT 16.6* 17.6*  INR 1.33 1.44  CREATININE 0.80 0.78  CKTOTAL 483* 361*    Estimated Creatinine Clearance: 70 mL/min (by C-G formula based on SCr of 0.78 mg/dL).   Medical History: Past Medical History:  Diagnosis Date  . Atrial fibrillation ([place])   . Atrial flutter ([place])    typical appearing  . Atrial tachycardia ([place])    ablated 11/17/10  by JA  from the [facility] of the aorta  . Dizziness    chronic and of an unclear etiology  . Gastritis   . GERD (gastroesophageal reflux disease)   . HTN (hypertension)   . Hyperlipemia   . Hypothyroidism   . Obesity   . Osteoporosis   . Vitamin D deficiency    Assessment: 82 y/o F here after being found down in the floor 10/27, warfarin PTA for afib, INR is still low at 1.44. CBC and renal function remain stable.CT head shows no bleed.  PTA warfarin dose: 2.5 mg Sunday, Tuesday, and Thursday. 5 mg all other days. Last home dose: 10/26  Patient received 5 mg inpatient 10/28 0200  Goal of Therapy:  INR 2-3 Monitor platelets by anticoagulation protocol: Yes   Plan:  -Warfarin 5 mg PO tonight X1 as PTA dosing  -Daily PT/INR -Monitor for bleeding  Pasty Spillers D PGY1 Pharmacy Resident [phone] 04/10/2016,10:10 AM

## 2016-04-10 NOTE — Progress Notes (Signed)
Pharmacy Antibiotic Note  Charlotte Castro is a 82 y.o. female admitted on 04/09/2016 with UTI.  Pharmacy has been consulted for Ceftriaxone dosing.  Plan: -Ceftriaxone 1g IV q24h -F/U urine culture for directed therapy  Temp (24hrs), Avg:97.6 F (36.4 C), Min:97.6 F (36.4 C), Max:97.6 F (36.4 C)   Recent Labs Lab 04/09/16 2057  WBC 15.6*  CREATININE 0.80    CrCl cannot be calculated (Unknown ideal weight.).    Allergies  Allergen Reactions  . Iodinated Diagnostic Agents Shortness Of Breath    headache     [name] 04/10/2016 12:26 AM

## 2016-04-10 NOTE — Discharge Instructions (Signed)

## 2016-04-10 NOTE — Progress Notes (Signed)
Pt admitted to [place] from ED. Pt lives at home alone. Pt A&Ox4. Pt's bilateral lower extremities has discoloration, dry and flaky. Pt has moisture associated skin damage to groin and buttocks (very red but blanchable) applied barrier cream. Applied yellow arm bracelet and yellow socks. Told pt to call for assistance before getting out of bed. Pt stated understanding. Will continue to monitor pt. Ranelle Oyster, RN

## 2016-04-10 NOTE — ED Notes (Signed)
Attempted Report 

## 2016-04-11 DIAGNOSIS — B9620 Unspecified Escherichia coli [E. coli] as the cause of diseases classified elsewhere: Secondary | ICD-10-CM

## 2016-04-11 LAB — CBC WITH DIFFERENTIAL/PLATELET
BASOS ABS: 0 10*3/uL (ref 0.0–0.1)
BASOS PCT: 1 %
EOS ABS: 0.3 10*3/uL (ref 0.0–0.7)
Eosinophils Relative: 4 %
HEMATOCRIT: 37.2 % (ref 36.0–46.0)
HEMOGLOBIN: 12.8 g/dL (ref 12.0–15.0)
Lymphocytes Relative: 22 %
Lymphs Abs: 1.6 10*3/uL (ref 0.7–4.0)
MCH: 32.2 pg (ref 26.0–34.0)
MCHC: 34.4 g/dL (ref 30.0–36.0)
MCV: 93.7 fL (ref 78.0–100.0)
MONOS PCT: 11 %
Monocytes Absolute: 0.8 10*3/uL (ref 0.1–1.0)
NEUTROS ABS: 4.6 10*3/uL (ref 1.7–7.7)
NEUTROS PCT: 62 %
Platelets: 147 10*3/uL — ABNORMAL LOW (ref 150–400)
RBC: 3.97 MIL/uL (ref 3.87–5.11)
RDW: 13.9 % (ref 11.5–15.5)
WBC: 7.4 10*3/uL (ref 4.0–10.5)

## 2016-04-11 LAB — COMPREHENSIVE METABOLIC PANEL
ALBUMIN: 2.7 g/dL — AB (ref 3.5–5.0)
ALK PHOS: 57 U/L (ref 38–126)
ALT: 18 U/L (ref 14–54)
ANION GAP: 6 (ref 5–15)
AST: 26 U/L (ref 15–41)
BILIRUBIN TOTAL: 0.6 mg/dL (ref 0.3–1.2)
BUN: 15 mg/dL (ref 6–20)
CALCIUM: 8.6 mg/dL — AB (ref 8.9–10.3)
CO2: 24 mmol/L (ref 22–32)
Chloride: 112 mmol/L — ABNORMAL HIGH (ref 101–111)
Creatinine, Ser: 0.85 mg/dL (ref 0.44–1.00)
GFR calc Af Amer: >60 mL/min (ref >60)
GFR calc non Af Amer: >60 mL/min (ref >60)
GLUCOSE: 99 mg/dL (ref 65–99)
POTASSIUM: 3.9 mmol/L (ref 3.5–5.1)
SODIUM: 142 mmol/L (ref 135–145)
Total Protein: 5.1 g/dL — ABNORMAL LOW (ref 6.5–8.1)

## 2016-04-11 LAB — PROTIME-INR
INR: 1.57
PROTHROMBIN TIME: 18.9 s — AB (ref 11.4–15.2)

## 2016-04-11 LAB — PHOSPHORUS
PHOSPHORUS: 2.6 mg/dL (ref 2.5–4.6)
Phosphorus: 2.3 mg/dL — ABNORMAL LOW (ref 2.5–4.6)

## 2016-04-11 LAB — MAGNESIUM: Magnesium: 1.8 mg/dL (ref 1.7–2.4)

## 2016-04-11 MED ORDER — WARFARIN SODIUM 5 MG PO TABS
5.0000 mg | ORAL_TABLET | Freq: Once | ORAL | Status: AC
  Administered 2016-04-11: 5 mg via ORAL
  Filled 2016-04-11 (×2): qty 2

## 2016-04-11 NOTE — Progress Notes (Signed)
CSW met with pt @ bedside. Pt pleasant, alert and oriented X4. CSW discussed physical therapy recommendations. Pt is not interested in SNF or HHC, feels she can manage on her own. Pt also reports that she has several animals to care for. Pt lives alone in a single family home with ramp, no stairs and ambulates with a walker or cane. Pt still driving and reports having a great support system; son and his girlfriend in [place] and dtr in [place]. Pt reports that her son will pick her up post DC. Pt has all DME in her home; Angela Burke, [facility], wheelchair. Pt reports no needs at this time.  CSW is signing off but will remain available to pt for support as needed.  Menna Abeln B. Paulette Blanch [phone]

## 2016-04-11 NOTE — Progress Notes (Signed)
PROGRESS NOTE    Charlotte Castro  [id number] DOB: 1932-08-04 DOA: 04/09/2016 PCP: Antony Blackbird, MD   Brief Narrative:  Charlotte Castro is a 82 y.o. female with a past medical history significant for HTN, Afib on warfarin, hypothyroidism, Parkinson's (?patient disputes, is not on meds), hiatal hernia and anxiety who presents with fall. The patient was in her usual state of health until the last few days, when her son notes she has been more weak than usual, he thought dehydrated because she doesn't drink enough water as has been recommended by her doctors. Then Thursday during the day, the patient was progressively weaker and at one point when she tried to get out of bed she just "slid down to the floor" without striking her head, and without losing consciousness but without being able to get up either.  She didn't have her phone, so (reportedly) she crawled around on the floor for 24 hours until this afternoon when family came over and found her on the floor, sluggish, with slurred speech, and having soiled herself and so they thought maybe she had a stroke and brought her to the ER for evaluation. She was worked up and admitted for Generalized Weakness and suspected UTI.   Assessment & Plan:   Principal Problem:   Weakness Active Problems:   Permanent atrial fibrillation (HCC)   Essential hypertension, benign   Dehydration   UTI (urinary tract infection)   Elevated CK   Chronic diastolic CHF (congestive heart failure) ([place])  1. Generalized Weakness likely from Dehydration in the Setting of E. Coli UTI with Fall out of bed:  -CT Head No CT evidence for acute intracranial abnormality. Moderate-to-marked periventricular white matter hypodensity, likely small vessel disease. -C/w IVF Rehydration -PT to Eval and Treat - RECOMMEND SNF however Patient refuses SNF OR HHC; Attempted to call Patient's son to see his thoughts on his mother going home. -TSH was 1.455 -No slurred Speech on my exam  and no focal deficits. Patient has Lip Tremor -Mag was 1.8 and Phos Level was 2.3  2. Sepsis 2/2 to E.Coli UTI, improving -Patient was Tachycardic, had elevated WBC at 15.6 and likely source of infection in Urine -WBC now 7.4 and patient Afebrile -Patient had Cloudy Urine with Many Bacteria, Small Leukocytes, Positive Nitrites, 0-5 WBC; Urine was foul smelling -Urine C/x showed >100,000 CFU  -C/w IVF at 75 mL/hr -Lactic Acid was 1.3 -Ceftriaxone IV Abx; Sensitivities pending  -Has Hx of Enterococcus UTI  3. HTN:  -Continue Metoprolol 50 mg po BID -Will Add Hydralazine if Necessary  4. Elevated CPK:  -Found down after 24 hours. -CPK went from 483 -> 361 -C/w IVF Rehydration  5. Atrial Fibrillation  -CHADS2-VASc 5.  -Continue Warfarin, dosed per pharmacy - 5 mg po Tonight -Patient was subtherapeutic with PT of 18.9 and INR of 1.57 -Continue Metoprolol 50 mg po BID  6. Chronic Diastolic CHF:  -On Metoprolol -Has Furosemide twice weekly on outside med list and she says she no longer takes it.  -Strict I's and O's, Daily Weights, -Continue to Monitor  7. Tremor: -Continue Clonazepam 0.25-0.5 mg BID -May need to See Neurology as outpatient.   8. Hypothyroidism: -TSH was 1.455 -Continue Levothyroxine 88 mcg po Daily  DVT prophylaxis: Anticoagulated with Warfarin Code Status: Full Family Communication: No Family at bedside Disposition Plan: SNF but patient refuses; Will discuss with Patient and Family  Consultants:   None  Procedures: None  Antimicrobials: IV Ceftriaxone 04/09/16 ->   Subjective:  Seen and examined at beside she states her back is raw from crawling around and sitting in chair too long. Denied any Cp, SOB, N/V or lightheadedness or dizziness. Still had tremor. Believes she can do alright at home and does not need therapy.   Objective: Vitals:   04/10/16 2046 04/11/16 0542 04/11/16 0814 04/11/16 1331  BP: (!) 151/62 (!) 146/60 (!) 159/94 (!)  146/85  Pulse: 95 78 93 76  Resp: 19 20  20   Temp: 98.2 F (36.8 C) 97.5 F (36.4 C)  97.7 F (36.5 C)  TempSrc:    Oral  SpO2: 97% 96% 100% 100%  Weight:      Height:        Intake/Output Summary (Last 24 hours) at 04/11/16 1937 Last data filed at 04/11/16 [id number]  Gross per 24 hour  Intake              270 ml  Output                0 ml  Net              270 ml   Filed Weights   04/10/16 0117  Weight: 101.6 kg (224 lb)    Examination: Physical Exam:  Constitutional: WN/WD elderly female, NAD and appears calm and comfortable Eyes: Lids and conjunctivae normal, sclerae anicteric  ENMT: External Ears, Nose appear normal. Grossly normal hearing.  Patient has mouth tremor/Tardive Dyskinesia.  Neck: Appears normal, supple, no cervical masses, normal ROM, no appreciable thyromegaly, no JVD Respiratory: Clear to auscultation bilaterally, no wheezing, rales, rhonchi or crackles. Normal respiratory effort and patient is not tachypenic. No accessory muscle use.  Cardiovascular: RRR, no murmurs / rubs / gallops. S1 and S2 auscultated. No extremity edema. 2+ pedal pulses.  Abdomen: Soft, non-tender, non-distended. No masses palpated. No appreciable hepatosplenomegaly. Bowel sounds positive x4.  GU: Deferred. Musculoskeletal: No clubbing / cyanosis of digits/nails. No joint deformity upper and lower extremities. No contractures. Skin: No rashes, lesions, ulcers. No induration on limited skin exam; Warm and dry.  Neurologic: CN 2-12 grossly intact with no focal deficits. Sensation intact in all 4 Extremities. Generalized Decreased Strength in all 4 Extremities. Romberg sign cerebellar reflexes not assessed.  Psychiatric: Normal judgment and insight. Alert and oriented x 3. Normal mood and appropriate affect.   Data Reviewed: I have personally reviewed following labs and imaging studies  CBC:  Recent Labs Lab 04/09/16 2057 04/10/16 0137 04/11/16 0544  WBC 15.6* 12.8* 7.4  NEUTROABS  13.6*  --  4.6  HGB 16.2* 14.2 12.8  HCT 45.0 39.7 37.2  MCV 93.6 92.1 93.7  PLT 167 150 [id number]*   Basic Metabolic Panel:  Recent Labs Lab 04/09/16 2057 04/10/16 0137 04/11/16 0544  NA 139 140 142  K 4.0 4.1 3.9  CL 106 108 112*  CO2 24 24 24   GLUCOSE 120* 102* 99  BUN 14 13 15   CREATININE 0.80 0.78 0.85  CALCIUM 9.4 8.5* 8.6*  MG  --   --  1.8  PHOS  --   --  2.3*   GFR: Estimated Creatinine Clearance: 65.9 mL/min (by C-G formula based on SCr of 0.85 mg/dL). Liver Function Tests:  Recent Labs Lab 04/09/16 2057 04/10/16 0137 04/11/16 0544  AST 39 35 26  ALT 21 20 18   ALKPHOS 75 63 57  BILITOT 2.1* 1.9* 0.6  PROT 6.6 5.7* 5.1*  ALBUMIN 3.7 3.1* 2.7*   No results for input(s): LIPASE, AMYLASE in the  last 168 hours. No results for input(s): AMMONIA in the last 168 hours. Coagulation Profile:  Recent Labs Lab 04/09/16 2057 04/10/16 0137 04/11/16 0544  INR 1.33 1.44 1.57   Cardiac Enzymes:  Recent Labs Lab 04/09/16 2057 04/10/16 0137  CKTOTAL 483* 361*   BNP (last 3 results) No results for input(s): PROBNP in the last 8760 hours. HbA1C: No results for input(s): HGBA1C in the last 72 hours. CBG: No results for input(s): GLUCAP in the last 168 hours. Lipid Profile: No results for input(s): CHOL, HDL, LDLCALC, TRIG, CHOLHDL, LDLDIRECT in the last 72 hours. Thyroid Function Tests:  Recent Labs  04/10/16 0137  TSH 1.455   Anemia Panel: No results for input(s): VITAMINB12, FOLATE, FERRITIN, TIBC, IRON, RETICCTPCT in the last 72 hours. Sepsis Labs:  Recent Labs Lab 04/10/16 0136  LATICACIDVEN 1.3    Recent Results (from the past 240 hour(s))  Urine culture     Status: Abnormal (Preliminary result)   Collection Time: 04/09/16 11:11 PM  Result Value Ref Range Status   Specimen Description URINE, RANDOM  Final   Special Requests NONE  Final   Culture (A)  Final    >=100,000 COLONIES/mL ESCHERICHIA COLI SUSCEPTIBILITIES TO FOLLOW    Report  Status PENDING  Incomplete  Culture, blood (single)     Status: None (Preliminary result)   Collection Time: 04/10/16  1:45 AM  Result Value Ref Range Status   Specimen Description BLOOD RIGHT ARM  Final   Special Requests BOTTLES DRAWN AEROBIC AND ANAEROBIC 4ML  Final   Culture NO GROWTH 1 DAY  Final   Report Status PENDING  Incomplete     Radiology Studies: Dg Chest 2 View  Result Date: 04/09/2016 CLINICAL DATA:  Hypertension, patient found on floor after fall EXAM: CHEST  2 VIEW COMPARISON:  10/09/2015. FINDINGS: There is no focal infiltrate or effusion. Stable mild enlargement of the cardiomediastinal. There is no pneumothorax. There is a large hiatal hernia again evident. Bones appear osteopenic. IMPRESSION: 1. No acute infiltrate or edema. 2. Large hiatal hernia 3. Mild cardiomegaly without overt failure. Electronically Signed   By: Donavan Foil M.D.   On: 04/09/2016 20:52   Ct Head Wo Contrast  Result Date: 04/09/2016 CLINICAL DATA:  Frontal headache EXAM: CT HEAD WITHOUT CONTRAST TECHNIQUE: Contiguous axial images were obtained from the base of the skull through the vertex without intravenous contrast. COMPARISON:  08/07/2012, 07/04/2012 FINDINGS: Brain: No acute territorial infarction, intracranial hemorrhage or extra-axial fluid collection. No focal mass, mass effect or midline shift. Moderate-to-marked periventricular confluent white matter hypodensity, likely small vessel disease, unchanged. Mild atrophy. Ventricles similar in size and morphology. Vascular: No hyperdense vessels.  Carotid artery calcifications. Skull: Mild heterogeneous attenuation likely osteopenia. Mastoid air cells clear. No fracture. A dense bony lesion off the left frontal bone is unchanged. There is hyperostosis. Sinuses/Orbits: Mucosal thickening within the ethmoid sinuses. No acute orbital abnormality. Other: None IMPRESSION: 1. No CT evidence for acute intracranial abnormality. 2. Moderate-to-marked  periventricular white matter hypodensity, small vessel disease. Electronically Signed   By: Donavan Foil M.D.   On: 04/09/2016 23:07   Scheduled Meds: . cefTRIAXone (ROCEPHIN)  IV  1 g Intravenous Q24H  . clonazePAM  0.25-0.5 mg Oral BID  . feeding supplement (ENSURE ENLIVE)  237 mL Oral BID BM  . levothyroxine  88 mcg Oral QAC breakfast  . metoprolol  50 mg Oral BID  . sodium chloride flush  3 mL Intravenous Q12H  . Warfarin - Pharmacist Dosing  Inpatient   Does not apply q1800   Continuous Infusions:    LOS: 2 days   Kerney Elbe, DO [facility] Pager [phone]  If 7PM-7AM, please contact night-coverage [email or web address] Password [facility] 04/11/2016, 7:37 PM

## 2016-04-11 NOTE — Progress Notes (Signed)
ANTICOAGULATION CONSULT NOTE - Follow-Up Consult   Pharmacy Consult for Warfarin  Indication: atrial fibrillation  Allergies  Allergen Reactions  . Iodinated Diagnostic Agents Shortness Of Breath    headache   Vital Signs: Temp: 97.5 F (36.4 C) (10/29 0542) BP: 159/94 (10/29 0814) Pulse Rate: 93 (10/29 0814)  Labs:  Recent Labs  04/09/16 2057 04/10/16 0137 04/11/16 0544  HGB 16.2* 14.2 12.8  HCT 45.0 39.7 37.2  PLT 167 150 147*  APTT 29  --   --   LABPROT 16.6* 17.6* 18.9*  INR 1.33 1.44 1.57  CREATININE 0.80 0.78 0.85  CKTOTAL 483* 361*  --     Estimated Creatinine Clearance: 65.9 mL/min (by C-G formula based on SCr of 0.85 mg/dL).   Medical History: Past Medical History:  Diagnosis Date  . Atrial fibrillation ([place])   . Atrial flutter ([place])    typical appearing  . Atrial tachycardia ([place])    ablated 11/17/10  by JA  from the [facility] of the aorta  . Dizziness    chronic and of an unclear etiology  . Gastritis   . GERD (gastroesophageal reflux disease)   . HTN (hypertension)   . Hyperlipemia   . Hypothyroidism   . Obesity   . Osteoporosis   . Vitamin D deficiency    Assessment: 82 y/o F here after being found down in the floor 10/27, warfarin PTA for afib, INR is still low at 1.57. CBC is down from yesterday (likely dilutional)  and renal function remains stable.  PTA warfarin dose: 2.5 mg Sunday, Tuesday, and Thursday. 5 mg all other days. Last home dose: 10/26    Goal of Therapy:  INR 2-3 Monitor platelets by anticoagulation protocol: Yes   Plan:  -Warfarin 5 mg PO tonight X1  -Daily PT/INR -Monitor for bleeding, CBC  Pasty Spillers D PGY1 Pharmacy Resident [phone] 04/11/2016,9:57 AM

## 2016-04-12 LAB — CBC WITH DIFFERENTIAL/PLATELET
Basophils Absolute: 0.1 10*3/uL (ref 0.0–0.1)
Basophils Relative: 1 %
EOS ABS: 0.4 10*3/uL (ref 0.0–0.7)
Eosinophils Relative: 5 %
HEMATOCRIT: 38.6 % (ref 36.0–46.0)
HEMOGLOBIN: 13.4 g/dL (ref 12.0–15.0)
LYMPHS ABS: 2.3 10*3/uL (ref 0.7–4.0)
Lymphocytes Relative: 29 %
MCH: 32.4 pg (ref 26.0–34.0)
MCHC: 34.7 g/dL (ref 30.0–36.0)
MCV: 93.5 fL (ref 78.0–100.0)
MONOS PCT: 9 %
Monocytes Absolute: 0.7 10*3/uL (ref 0.1–1.0)
NEUTROS ABS: 4.4 10*3/uL (ref 1.7–7.7)
NEUTROS PCT: 56 %
Platelets: 156 10*3/uL (ref 150–400)
RBC: 4.13 MIL/uL (ref 3.87–5.11)
RDW: 13.5 % (ref 11.5–15.5)
WBC: 7.9 10*3/uL (ref 4.0–10.5)

## 2016-04-12 LAB — COMPREHENSIVE METABOLIC PANEL
ALK PHOS: 57 U/L (ref 38–126)
ALT: 18 U/L (ref 14–54)
AST: 25 U/L (ref 15–41)
Albumin: 2.8 g/dL — ABNORMAL LOW (ref 3.5–5.0)
Anion gap: 6 (ref 5–15)
BUN: 13 mg/dL (ref 6–20)
CALCIUM: 8.8 mg/dL — AB (ref 8.9–10.3)
CHLORIDE: 108 mmol/L (ref 101–111)
CO2: 27 mmol/L (ref 22–32)
CREATININE: 0.87 mg/dL (ref 0.44–1.00)
GFR calc Af Amer: >60 mL/min (ref >60)
Glucose, Bld: 93 mg/dL (ref 65–99)
Potassium: 3.9 mmol/L (ref 3.5–5.1)
SODIUM: 141 mmol/L (ref 135–145)
Total Bilirubin: 0.8 mg/dL (ref 0.3–1.2)
Total Protein: 5.5 g/dL — ABNORMAL LOW (ref 6.5–8.1)

## 2016-04-12 LAB — PROTIME-INR
INR: 1.53
Prothrombin Time: 18.6 seconds — ABNORMAL HIGH (ref 11.4–15.2)

## 2016-04-12 LAB — URINE CULTURE

## 2016-04-12 LAB — MAGNESIUM: MAGNESIUM: 1.8 mg/dL (ref 1.7–2.4)

## 2016-04-12 MED ORDER — WARFARIN SODIUM 7.5 MG PO TABS
7.5000 mg | ORAL_TABLET | Freq: Once | ORAL | Status: AC
  Administered 2016-04-12: 7.5 mg via ORAL
  Filled 2016-04-12: qty 1

## 2016-04-12 MED ORDER — PANTOPRAZOLE SODIUM 40 MG PO TBEC
40.0000 mg | DELAYED_RELEASE_TABLET | Freq: Every day | ORAL | Status: DC
  Administered 2016-04-12: 40 mg via ORAL
  Filled 2016-04-12: qty 1

## 2016-04-12 MED ORDER — CEPHALEXIN 500 MG PO CAPS
500.0000 mg | ORAL_CAPSULE | Freq: Two times a day (BID) | ORAL | Status: DC
  Administered 2016-04-12 – 2016-04-13 (×2): 500 mg via ORAL
  Filled 2016-04-12 (×2): qty 1

## 2016-04-12 MED ORDER — SODIUM CHLORIDE 0.9 % IV SOLN
INTRAVENOUS | Status: DC
  Administered 2016-04-12 – 2016-04-13 (×3): via INTRAVENOUS

## 2016-04-12 NOTE — Progress Notes (Signed)
Patient requesting to go to SNF on [facility] since she has been there before. [facility] can take patient at discharge.  Percell Locus Melvern Ramone LCSWA [phone]

## 2016-04-12 NOTE — Progress Notes (Signed)
ANTICOAGULATION CONSULT NOTE - [place] for warfarin  Indication: atrial fibrillation  Allergies  Allergen Reactions  . Iodinated Diagnostic Agents Shortness Of Breath    headache   Vital Signs: Temp: 97.5 F (36.4 C) (10/30 0555) Temp Source: Oral (10/30 0555) BP: 153/86 (10/30 0821) Pulse Rate: 80 (10/30 0821)  Labs:  Recent Labs  04/09/16 2057 04/10/16 0137 04/11/16 0544 04/12/16 0529  HGB 16.2* 14.2 12.8 13.4  HCT 45.0 39.7 37.2 38.6  PLT 167 150 147* 156  APTT 29  --   --   --   LABPROT 16.6* 17.6* 18.9* 18.6*  INR 1.33 1.44 1.57 1.53  CREATININE 0.80 0.78 0.85 0.87  CKTOTAL 483* 361*  --   --     Estimated Creatinine Clearance: 64.4 mL/min (by C-G formula based on SCr of 0.87 mg/dL).  Assessment: 82 y/o F here after being found down on the floor 10/27. She took warfarin PTA for Afib.  INR is subtherapeutic at 1.53 and minimal increase on home dosing so will give extra tonight. No bleeding noted, CBC is stable.  PTA dose: 2.5 mg Sunday, Tuesday, and Thursday. 5 mg all other days. Last home dose: 10/26  Goal of Therapy:  INR 2-3 Monitor platelets by anticoagulation protocol: Yes   Plan:  -Warfarin 7.5 mg PO tonight  -Daily PT/INR -Monitor for s/sx of bleeding  Renold Genta, PharmD, BCPS Clinical Pharmacist Phone for today - [place] - [phone] 04/12/2016 1:14 PM

## 2016-04-12 NOTE — NC FL2 (Signed)
[place] LEVEL OF CARE SCREENING TOOL     IDENTIFICATION  Patient Name: Charlotte Castro Birthdate: 1933/05/01 Sex: female Admission Date (Current Location): 04/09/2016  [facility] [place] Number:  Herbalist and Address:  [facility] [place], [place] [address], [place], [place] [address]      Provider Number: [id number]  Attending Physician Name and Address:  Kerney Elbe, DO  Relative Name and Phone Number:       Current Level of Care: Hospital Recommended Level of Care: [place] Prior Approval Number:    Date Approved/Denied:   PASRR Number: [id number]  Discharge Plan: SNF    Current Diagnoses: Patient Active Problem List   Diagnosis Date Noted  . Weakness 04/10/2016  . Elevated CK 04/10/2016  . Chronic diastolic CHF (congestive heart failure) ([place]) 04/10/2016  . UTI (urinary tract infection) 04/09/2016  . Dehydration   . Fatigue 03/29/2014  . Essential hypertension, benign 01/18/2014  . Long term current use of anticoagulant therapy 01/18/2014  . Encounter for therapeutic drug monitoring 07/13/2013  . Bradycardia 09/16/2012  . Permanent atrial fibrillation ([place]) 06/10/2011  . Dizziness 12/24/2010    Orientation RESPIRATION BLADDER Height & Weight     Self, Time, Situation, Place  Normal Incontinent Weight: 101.6 kg (224 lb) Height:  5\' 9"  (175.3 cm)  BEHAVIORAL SYMPTOMS/MOOD NEUROLOGICAL BOWEL NUTRITION STATUS      Incontinent Diet (Please see DC Summary)  AMBULATORY STATUS COMMUNICATION OF NEEDS Skin   Limited Assist Verbally Normal                       Personal Care Assistance Level of Assistance  Bathing, Feeding, Dressing Bathing Assistance: Limited assistance Feeding assistance: Independent Dressing Assistance: Limited assistance     Functional Limitations Info             SPECIAL CARE FACTORS FREQUENCY  PT (By licensed PT)     PT Frequency: 5x/week               Contractures Contractures Info: Not present    Additional Factors Info  Code Status, Allergies Code Status Info: Full Allergies Info: Iodinated Diagnostic Agents           Current Medications (04/12/2016):  This is the current hospital active medication list Current Facility-Administered Medications  Medication Dose Route Frequency Provider Last Rate Last Dose  . 0.9 %  sodium chloride infusion   Intravenous Continuous [place], DO 75 mL/hr at 04/12/16 1206    . acetaminophen (TYLENOL) tablet 650 mg  650 mg Oral Q6H PRN Edwin Dada, MD   650 mg at 04/10/16 0043   Or  . acetaminophen (TYLENOL) suppository 650 mg  650 mg Rectal Q6H PRN Edwin Dada, MD      . cephALEXin (KEFLEX) capsule 500 mg  500 mg Oral Q12H [place], DO      . clonazePAM (KLONOPIN) tablet 0.25-0.5 mg  0.25-0.5 mg Oral BID Edwin Dada, MD   0.5 mg at 04/12/16 [id number]  . feeding supplement (ENSURE ENLIVE) (ENSURE ENLIVE) liquid 237 mL  237 mL Oral BID BM Edwin Dada, MD   237 mL at 04/12/16 1000  . ibuprofen (ADVIL,MOTRIN) tablet 400 mg  400 mg Oral Q6H PRN Edwin Dada, MD   400 mg at 04/12/16 0824  . levothyroxine (SYNTHROID, LEVOTHROID) tablet 88 mcg  88 mcg Oral QAC breakfast Edwin Dada, MD  88 mcg at 04/12/16 0821  . metoprolol (LOPRESSOR) tablet 50 mg  50 mg Oral BID Edwin Dada, MD   50 mg at 04/12/16 [id number]  . ondansetron (ZOFRAN) tablet 4 mg  4 mg Oral Q6H PRN Edwin Dada, MD   4 mg at 04/10/16 0043   Or  . ondansetron (ZOFRAN) injection 4 mg  4 mg Intravenous Q6H PRN Edwin Dada, MD   4 mg at 04/11/16 1458  . pantoprazole (PROTONIX) EC tablet 40 mg  40 mg Oral Daily [facility], DO   40 mg at 04/12/16 1204  . senna-docusate (Senokot-S) tablet 1 tablet  1 tablet Oral QHS PRN Edwin Dada, MD      . sodium chloride flush (NS) 0.9 % injection 3 mL  3 mL Intravenous Q12H Edwin Dada, MD   Stopped at 04/12/16 1145  . warfarin (COUMADIN) tablet 7.5 mg  7.5 mg Oral ONCE-1800 [place], [facility]      . Warfarin - Pharmacist Dosing Inpatient   Does not apply [place], [facility]         Discharge Medications: Please see discharge summary for a list of discharge medications.  Relevant Imaging Results:  Relevant Lab Results:   Additional Information SSN: [place], [place]

## 2016-04-12 NOTE — Progress Notes (Signed)
Physical Therapy Treatment Patient Details Name: Charlotte Castro MRN: [id number] DOB: [birth year] Today's Date: 04/12/2016    History of Present Illness pt is an 82 y/o female with mh of HTN, Afib, tremors who presents with fall and supposedly was scoot around for about or in the floor for 24 hours and unable to get up in one of her chairs.      PT Comments    Pt admitted with above diagnosis. Pt currently with functional limitations due to balance and endurance deficits as well as decr safety.  Pt was lying in urine and BM on arrival and NT assisting with clean up.  Discussed with pt how she thinks she is going to be able to care for herself at home and pt agrees she will go to SNF.  Pt was able to ambulate but currently needs assist with all care. SNF best option.  Pt will benefit from skilled PT to increase their independence and safety with mobility to allow discharge to the venue listed below.    Follow Up Recommendations  SNF;Supervision/Assistance - 24 hour     Equipment Recommendations  None recommended by PT    Recommendations for Other Services       Precautions / Restrictions Precautions Precautions: Fall Restrictions Weight Bearing Restrictions: No    Mobility  Bed Mobility Overal bed mobility: Needs Assistance Bed Mobility: Supine to Sit     Supine to sit: Min assist     General bed mobility comments: On arrival pt lying in bed and NT cleaning pt of urine and BM.  Assisted NT in getting pt to EOB to assist with changing gown and linens as well as standing withpt so he could get pt cleaned appropriately.  mild struggle with  assist needed for LEs and elevation of trunk.  Transfers Overall transfer level: Needs assistance Equipment used: Rolling walker (2 wheeled);None Transfers: Sit to/from Stand Sit to Stand: Min guard         General transfer comment: no assist needed but effortful  Ambulation/Gait Ambulation/Gait assistance: Min guard;Min  assist Ambulation Distance (Feet): 50 Feet Assistive device: Rolling walker (2 wheeled) Gait Pattern/deviations: Step-through pattern;Decreased stride length;Drifts right/left   Gait velocity interpretation: Below normal speed for age/gender General Gait Details: steady, but easily fatigued.  Cues for posture and better position in the RW.  Pt needed cues for safety awareness especially with turns.  At times unsteady.   Stairs            Wheelchair Mobility    Modified Rankin (Stroke Patients Only)       Balance Overall balance assessment: Needs assistance Sitting-balance support: Bilateral upper extremity supported;Feet supported Sitting balance-[name]: Poor Sitting balance - Comments: needed UE support on EOB.   Standing balance support: During functional activity;Bilateral upper extremity supported Standing balance-[name]: Poor Standing balance comment: relies on UE support on RW                    Cognition Arousal/Alertness: Awake/alert Behavior During Therapy: WFL for tasks assessed/performed Overall Cognitive Status: Within Functional Limits for tasks assessed                      Exercises      General Comments        Pertinent Vitals/Pain Pain Assessment: No/denies pain  VSS.    Home Living  Prior Function            PT Goals (current goals can now be found in the care plan section) Progress towards PT goals: Progressing toward goals    Frequency    Min 3X/week      PT Plan Current plan remains appropriate    Co-evaluation             End of Session Equipment Utilized During Treatment: Gait belt Activity Tolerance: Patient limited by fatigue Patient left: in chair;with chair alarm set;with call bell/phone within reach     Time: 1015-1038 PT Time Calculation (min) (ACUTE ONLY): 23 min  Charges:  $Gait Training: 8-22 mins $Self Care/Home Management: 8-22                     G CodesDenice Paradise 08-May-2016, 11:41 AM Promise Weldin,PT Acute Rehabilitation [phone] (pager)

## 2016-04-12 NOTE — Consult Note (Signed)
   [facility] CM Inpatient Consult   04/12/2016  Charlotte Castro June 02, 1933 [id number]   Patient screened for potential [place] Management services. Patient is eligible for [facility] Care Management services under patient's Medicare plan. Chart review reveals the patient is an 82 y/o female with PMH of HTN, Afib, tremors who presents with fall and supposedly was scoot around for about or in the floor for 24 hours and unable to get up in one of her chairs. Chart review also reveals that the patient has been conflicted about going to a skilled facility which has been recommended.  According to the Physical Therapist notes today, the patient has decided on a skilled facility stay. Will follow up with inpatient care managers regarding eligibility and for progression of needs. Please place a [facility] Care Management consult or for questions contact:   Natividad Brood, RN BSN [facility] Liaison  [phone] business mobile phone Toll free office [phone]

## 2016-04-12 NOTE — Clinical Social Work Note (Signed)
Clinical Social Work Assessment  Patient Details  Name: Charlotte Castro MRN: [id number] Date of Birth: 07-31-1932  Date of referral:  04/12/16               Reason for consult:  Facility Placement                Permission sought to share information with:  Facility Sport and exercise psychologist, Family Supports Permission granted to share information::  Yes, Verbal Permission Granted  Name::     Clinical biochemist::  SNFs  Relationship::  Son  Contact Information:  [phone]  Housing/Transportation Living arrangements for the past 2 months:  [place] of Information:  Patient Patient Interpreter Needed:  None Criminal Activity/Legal Involvement Pertinent to Current Situation/Hospitalization:  No - Comment as needed Significant Relationships:  Adult Children Lives with:  Self Do you feel safe going back to the place where you live?  No Need for family participation in patient care:  No (Coment)  Care giving concerns:  CSW received consult for possible SNF placement at time of discharge. CSW met with patient regarding PT recommendation of SNF placement at time of discharge. Patient lives at home alone and does not feel safe returning home given patient's current physical needs and fall risk. Patient expressed understanding of PT recommendation and is agreeable to SNF placement at time of discharge. CSW to continue to follow and assist with discharge planning needs.   Social Worker assessment / plan:  CSW spoke with patient concerning possibility of rehab at [facility] before returning home. CSW also left son a Advertising account executive.   Employment status:  Retired Forensic scientist:  Medicare PT Recommendations:  [place] / Referral to community resources:  [place]  Patient/Family's Response to care:  Patient now recognizes need for rehab before returning home and is agreeable to a SNF in [place]. Patient reported preference for [facility]  since she has been there before.  Patient/Family's Understanding of and Emotional Response to Diagnosis, Current Treatment, and Prognosis:  Patient/family is realistic regarding therapy needs and expressed being hopeful for SNF placement. Patient expressed understanding of CSW role and discharge process. No questions/concerns about plan or treatment.    Emotional Assessment Appearance:  Appears stated age Attitude/Demeanor/Rapport:  Other (Appropriate) Affect (typically observed):  Accepting, Appropriate Orientation:  Oriented to Self, Oriented to Situation, Oriented to Place, Oriented to  Time Alcohol / Substance use:  Not Applicable Psych involvement (Current and /or in the community):  No (Comment)  Discharge Needs  Concerns to be addressed:  Care Coordination Readmission within the last 30 days:  No Current discharge risk:  None Barriers to Discharge:  Continued Medical Work up   [employer], [place] 04/12/2016, 4:14 PM

## 2016-04-12 NOTE — Progress Notes (Signed)
PROGRESS NOTE    Charlotte Castro  [id number] DOB: Apr 17, 1933 DOA: 04/09/2016 PCP: Antony Blackbird, MD   Brief Narrative:  Charlotte Castro is a 82 y.o. female with a past medical history significant for HTN, Afib on warfarin, hypothyroidism, Parkinson's (?patient disputes, is not on meds), hiatal hernia and anxiety who presents with fall. The patient was in her usual state of health until the last few days, when her son notes she has been more weak than usual, he thought dehydrated because she doesn't drink enough water as has been recommended by her doctors. Then Thursday during the day, the patient was progressively weaker and at one point when she tried to get out of bed she just "slid down to the floor" without striking her head, and without losing consciousness but without being able to get up either.  She didn't have her phone, so (reportedly) she crawled around on the floor for 24 hours until this afternoon when family came over and found her on the floor, sluggish, with slurred speech, and having soiled herself and so they thought maybe she had a stroke and brought her to the ER for evaluation. She was worked up and admitted for Generalized Weakness and suspected UTI. Patient was recommended to go to SNF for Rehab however refused that and Home Health. Was going to be D/C'd Home today and understood risks and changed her mind and now wants to go to SNF for short term. Social Work [employer] and working on placement now.   Assessment & Plan:   Principal Problem:   Weakness Active Problems:   Permanent atrial fibrillation (HCC)   Essential hypertension, benign   Dehydration   UTI (urinary tract infection)   Elevated CK   Chronic diastolic CHF (congestive heart failure) ([place])  1. Generalized Weakness likely from Dehydration in the Setting of E. Coli UTI with Fall out of bed:  -CT Head No CT evidence for acute intracranial abnormality. Moderate-to-marked periventricular white matter  hypodensity, likely small vessel disease. -C/w IVF Rehydration -PT to Eval and Treat - RECOMMEND SNF however Patient refused SNF OR HHC yesterday and today however later changed her mind and now wants to attempt SNF; Spoke to Patient's Son and Daughter at Length over the Phone about Plan of Care and they are in agreement.  -TSH was 1.455 -Mag was 1.8 and Phos Level was 2.3  2. Sepsis 2/2 to E.Coli UTI, improved -Patient was Tachycardic, had elevated WBC at 15.6 and likely source of infection in Urine -WBC now 7.9 and patient Afebrile -Patient had Cloudy Urine with Many Bacteria, Small Leukocytes, Positive Nitrites, 0-5 WBC; Urine was foul smelling -Urine C/x showed >100,000 CFU of E. Coli  -C/w IVF at 75 mL/hr -Lactic Acid was 1.3 -Ceftriaxone IV Abx change to Cephalexin 500 mg po BID by Pharmacy as Sensitive -C/w Keflex 500 mg po for 7 Doses for completion of 7 Days of Abx.  -Has Hx of Enterococcus UTI  3. HTN:  -Continue Metoprolol 50 mg po BID -Will Add Hydralazine if Necessary  4. Elevated CPK:  -Found down after 24 hours. -CPK went from 483 -> 361 -C/w IVF Rehydration at 75 mL/hr  5. Atrial Fibrillation  -CHADS2-VASc 5.  -Continue Warfarin, dosed per pharmacy - 7.5 mg po Tonight -Patient was subtherapeutic with PT of 18.6 and INR of 1.53 -Continue Metoprolol 50 mg po BID  6. Chronic Diastolic CHF:  -On Metoprolol -Has Furosemide twice weekly on outside med list and she says she no longer  takes it.  -Strict I's and O's, Daily Weights, -Continue to Monitor  7. Tremor: -Continue Clonazepam 0.25-0.5 mg BID -May need to See Neurology as outpatient.   8. 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  Disposition Plan: SNF   Consultants:   None  Procedures: None  Antimicrobials: IV Ceftriaxone 04/09/16 ->   Subjective: Seen and examined at beside and was going to be D/C'd Home because she refused SNF and HHC but changed her mind. States she has this Nauseous feeling in her stomach and avoided the tomato that came with her Meal Tray and states she has been drinking fluids to "push this feeling down." Has a history of Hiatal Hernia so she was started on Protonix. No other complaints or concerns and states she is willing to try Rehab for 1 week.   Objective: Vitals:   04/11/16 2230 04/12/16 0555 04/12/16 0821 04/12/16 1319  BP: (!) 156/85 (!) 164/91 (!) 153/86 (!) 158/86  Pulse: 80 80 80 85  Resp: 18   16  Temp: 97.9 F (36.6 C) 97.5 F (36.4 C)  97.8 F (36.6 C)  TempSrc: Oral Oral  Oral  SpO2: 100% 99%  94%  Weight:      Height:        Intake/Output Summary (Last 24 hours) at 04/12/16 1656 Last data filed at 04/11/16 2141  Gross per 24 hour  Intake               50 ml  Output                0 ml  Net               50 ml   Filed Weights   04/10/16 0117  Weight: 101.6 kg (224 lb)    Examination: Physical Exam:  Constitutional: WN/WD elderly female, NAD and appears calm and comfortable Eyes: Lids and conjunctivae normal, sclerae anicteric  ENMT: External Ears, Nose appear normal. Grossly normal hearing.  Patient has mouth tremor/Tardive Dyskinesia.  Neck: Appears normal, supple, no cervical masses, normal ROM, no appreciable thyromegaly, no JVD Respiratory: Clear to auscultation bilaterally, no wheezing, rales, rhonchi or crackles. Normal respiratory effort and patient is not tachypenic. No accessory muscle use.  Cardiovascular: RRR, no murmurs / rubs / gallops. S1 and S2 auscultated. No extremity edema. 2+ pedal  pulses.  Abdomen: Soft, non-tender, non-distended. No masses palpated. No appreciable hepatosplenomegaly. Bowel sounds positive x4.  GU: Deferred. Musculoskeletal: No clubbing / cyanosis of digits/nails. No joint deformity upper and lower extremities. No contractures. Skin: No rashes, lesions, ulcers. No induration on limited skin exam; Warm and dry.  Neurologic: CN 2-12 grossly intact with no focal deficits. Sensation intact in all 4 Extremities. Generalized Decreased Strength in all 4 Extremities. Romberg sign cerebellar reflexes not assessed.  Psychiatric: Normal judgment and insight. Alert and oriented x 3. Anxious mood and appropriate affect.   Data Reviewed: I have personally reviewed following labs and imaging studies  CBC:  Recent Labs Lab 04/09/16 2057 04/10/16 0137 04/11/16 0544 04/12/16 0529  WBC 15.6* 12.8* 7.4 7.9  NEUTROABS  13.6*  --  4.6 4.4  HGB 16.2* 14.2 12.8 13.4  HCT 45.0 39.7 37.2 38.6  MCV 93.6 92.1 93.7 93.5  PLT 167 150 147* [id number]   Basic Metabolic Panel:  Recent Labs Lab 04/09/16 2057 04/10/16 0137 04/11/16 0544 04/11/16 2040 04/12/16 0529  NA 139 140 142  --  141  K 4.0 4.1 3.9  --  3.9  CL 106 108 112*  --  108  CO2 24 24 24   --  27  GLUCOSE 120* 102* 99  --  93  BUN 14 13 15   --  13  CREATININE 0.80 0.78 0.85  --  0.87  CALCIUM 9.4 8.5* 8.6*  --  8.8*  MG  --   --  1.8  --  1.8  PHOS  --   --  2.3* 2.6  --    GFR: Estimated Creatinine Clearance: 64.4 mL/min (by C-G formula based on SCr of 0.87 mg/dL). Liver Function Tests:  Recent Labs Lab 04/09/16 2057 04/10/16 0137 04/11/16 0544 04/12/16 0529  AST 39 35 26 25  ALT 21 20 18 18   ALKPHOS 75 63 57 57  BILITOT 2.1* 1.9* 0.6 0.8  PROT 6.6 5.7* 5.1* 5.5*  ALBUMIN 3.7 3.1* 2.7* 2.8*   No results for input(s): LIPASE, AMYLASE in the last 168 hours. No results for input(s): AMMONIA in the last 168 hours. Coagulation Profile:  Recent Labs Lab 04/09/16 2057 04/10/16 0137  04/11/16 0544 04/12/16 0529  INR 1.33 1.44 1.57 1.53   Cardiac Enzymes:  Recent Labs Lab 04/09/16 2057 04/10/16 0137  CKTOTAL 483* 361*   BNP (last 3 results) No results for input(s): PROBNP in the last 8760 hours. HbA1C: No results for input(s): HGBA1C in the last 72 hours. CBG: No results for input(s): GLUCAP in the last 168 hours. Lipid Profile: No results for input(s): CHOL, HDL, LDLCALC, TRIG, CHOLHDL, LDLDIRECT in the last 72 hours. Thyroid Function Tests:  Recent Labs  04/10/16 0137  TSH 1.455   Anemia Panel: No results for input(s): VITAMINB12, FOLATE, FERRITIN, TIBC, IRON, RETICCTPCT in the last 72 hours. Sepsis Labs:  Recent Labs Lab 04/10/16 0136  LATICACIDVEN 1.3    Recent Results (from the past 240 hour(s))  Urine culture     Status: Abnormal   Collection Time: 04/09/16 11:11 PM  Result Value Ref Range Status   Specimen Description URINE, RANDOM  Final   Special Requests NONE  Final   Culture >=100,000 COLONIES/mL ESCHERICHIA COLI (A)  Final   Report Status 04/12/2016 FINAL  Final   Organism ID, Bacteria ESCHERICHIA COLI (A)  Final      Susceptibility   Escherichia coli - MIC*    AMPICILLIN >=32 RESISTANT Resistant     CEFAZOLIN 16 SENSITIVE Sensitive     CEFTRIAXONE <=1 SENSITIVE Sensitive     CIPROFLOXACIN 0.5 SENSITIVE Sensitive     GENTAMICIN <=1 SENSITIVE Sensitive     IMIPENEM <=0.25 SENSITIVE Sensitive     NITROFURANTOIN <=16 SENSITIVE Sensitive     TRIMETH/SULFA <=20 SENSITIVE Sensitive     AMPICILLIN/SULBACTAM >=32 RESISTANT Resistant     PIP/TAZO <=4 SENSITIVE Sensitive     Extended ESBL NEGATIVE Sensitive     * >=100,000 COLONIES/mL ESCHERICHIA COLI  Culture, blood (single)     Status: None (Preliminary result)   Collection Time: 04/10/16  1:45 AM  Result Value Ref Range Status   Specimen Description BLOOD RIGHT ARM  Final   Special Requests BOTTLES DRAWN AEROBIC AND ANAEROBIC 4ML  Final   Culture NO GROWTH 2 DAYS  Final    Report Status PENDING  Incomplete     Radiology Studies: No results found. Scheduled Meds: . cephALEXin  500 mg Oral Q12H  . clonazePAM  0.25-0.5 mg Oral BID  . feeding supplement (ENSURE ENLIVE)  237 mL Oral BID BM  . levothyroxine  88 mcg Oral QAC breakfast  . metoprolol  50 mg Oral BID  . pantoprazole  40 mg Oral Daily  . sodium chloride flush  3 mL Intravenous Q12H  . warfarin  7.5 mg Oral ONCE-1800  . Warfarin - Pharmacist Dosing Inpatient   Does not apply q1800   Continuous Infusions: . sodium chloride 75 mL/hr at 04/12/16 1206    LOS: 3 days   Kerney Elbe, DO [facility] Pager [phone]  If 7PM-7AM, please contact night-coverage [email or web address] Password [email or web address] 04/12/2016, 4:56 PM

## 2016-04-13 LAB — COMPREHENSIVE METABOLIC PANEL
ALBUMIN: 2.8 g/dL — AB (ref 3.5–5.0)
ALT: 18 U/L (ref 14–54)
AST: 22 U/L (ref 15–41)
Alkaline Phosphatase: 55 U/L (ref 38–126)
Anion gap: 3 — ABNORMAL LOW (ref 5–15)
BILIRUBIN TOTAL: 0.7 mg/dL (ref 0.3–1.2)
BUN: 14 mg/dL (ref 6–20)
CHLORIDE: 112 mmol/L — AB (ref 101–111)
CO2: 27 mmol/L (ref 22–32)
Calcium: 8.6 mg/dL — ABNORMAL LOW (ref 8.9–10.3)
Creatinine, Ser: 0.77 mg/dL (ref 0.44–1.00)
GFR calc Af Amer: >60 mL/min (ref >60)
GFR calc non Af Amer: >60 mL/min (ref >60)
GLUCOSE: 89 mg/dL (ref 65–99)
POTASSIUM: 3.9 mmol/L (ref 3.5–5.1)
Sodium: 142 mmol/L (ref 135–145)
TOTAL PROTEIN: 5.5 g/dL — AB (ref 6.5–8.1)

## 2016-04-13 LAB — CBC WITH DIFFERENTIAL/PLATELET
BASOS ABS: 0 10*3/uL (ref 0.0–0.1)
BASOS PCT: 1 %
Eosinophils Absolute: 0.3 10*3/uL (ref 0.0–0.7)
Eosinophils Relative: 5 %
HEMATOCRIT: 37.2 % (ref 36.0–46.0)
Hemoglobin: 13 g/dL (ref 12.0–15.0)
Lymphocytes Relative: 27 %
Lymphs Abs: 1.7 10*3/uL (ref 0.7–4.0)
MCH: 33.5 pg (ref 26.0–34.0)
MCHC: 34.9 g/dL (ref 30.0–36.0)
MCV: 95.9 fL (ref 78.0–100.0)
MONO ABS: 0.7 10*3/uL (ref 0.1–1.0)
Monocytes Relative: 12 %
NEUTROS ABS: 3.3 10*3/uL (ref 1.7–7.7)
NEUTROS PCT: 55 %
Platelets: 166 10*3/uL (ref 150–400)
RBC: 3.88 MIL/uL (ref 3.87–5.11)
RDW: 14 % (ref 11.5–15.5)
WBC: 6.1 10*3/uL (ref 4.0–10.5)

## 2016-04-13 LAB — MAGNESIUM: Magnesium: 1.9 mg/dL (ref 1.7–2.4)

## 2016-04-13 LAB — PHOSPHORUS: Phosphorus: 3.5 mg/dL (ref 2.5–4.6)

## 2016-04-13 LAB — PROTIME-INR
INR: 1.72
PROTHROMBIN TIME: 20.4 s — AB (ref 11.4–15.2)

## 2016-04-13 MED ORDER — PANTOPRAZOLE SODIUM 40 MG PO TBEC
40.0000 mg | DELAYED_RELEASE_TABLET | Freq: Two times a day (BID) | ORAL | Status: DC
  Administered 2016-04-13: 40 mg via ORAL
  Filled 2016-04-13: qty 1

## 2016-04-13 MED ORDER — GI COCKTAIL ~~LOC~~
30.0000 mL | Freq: Once | ORAL | Status: AC
  Administered 2016-04-13: 30 mL via ORAL
  Filled 2016-04-13: qty 30

## 2016-04-13 MED ORDER — CLONAZEPAM 0.5 MG PO TABS: 0.2500 mg | ORAL_TABLET | Freq: Two times a day (BID) | ORAL | 0 refills | Status: DC

## 2016-04-13 MED ORDER — WARFARIN SODIUM 7.5 MG PO TABS: 7.5000 mg | ORAL_TABLET | Freq: Once | ORAL | Status: DC

## 2016-04-13 MED ORDER — CEPHALEXIN 500 MG PO CAPS: 500.0000 mg | ORAL_CAPSULE | Freq: Two times a day (BID) | ORAL | 0 refills | Status: DC

## 2016-04-13 MED ORDER — ENSURE ENLIVE PO LIQD: 237.0000 mL | Freq: Two times a day (BID) | ORAL | 12 refills | Status: DC

## 2016-04-13 MED ORDER — PANTOPRAZOLE SODIUM 40 MG PO TBEC: 40.0000 mg | DELAYED_RELEASE_TABLET | Freq: Two times a day (BID) | ORAL | 0 refills | Status: DC

## 2016-04-13 NOTE — Discharge Summary (Signed)
Physician Discharge Summary  Charlotte Castro [id number] DOB: March 22, 1933 DOA: 04/09/2016  PCP: Charlotte Blackbird, MD  Admit date: 04/09/2016 Discharge date: 04/13/2016  Admitted From: Home Disposition:  [facility]  Recommendations for Outpatient Follow-up:  1. Follow up with PCP in 1-2 weeks 2. Please obtain BMP/CBC in one week   Home Health: No Equipment/Devices: None  Discharge Condition: Stable CODE STATUS: FULL Diet recommendation: Heart Healthy   Brief/Interim Summary: Charlotte Seastrand Wilsonis a 82 y.o.femalewith a past medical history significant for HTN, Afib on warfarin, hypothyroidism, Parkinson's (?patient disputes, is not on meds), hiatal hernia and anxietywho presents with fall. The patient was in her usual state of health until the last few days, when her son notes she has been more weak than usual, he thought dehydrated because she doesn't drink enough water as has been recommended by her doctors. Then Thursday during the day, the patient was progressively weaker and at one point when she tried to get out of bed she just "slid down to the floor" without striking her head, and without losing consciousness but without being able to get up either. She didn't have her phone, so (reportedly) she crawled around on the floor for 24 hours until this afternoon when family came over and found her on the floor, sluggish, with slurred speech, and having soiled herself and so they thought maybe she had a stroke and brought her to the ER for evaluation. She was worked up and admitted for Generalized Weakness and suspected UTI. Patient was recommended to go to SNF for Rehab however refused that and Home Health. Was going to be D/C'd Home yesterday and understood risks and changed her mind and now wants to go to SNF for short term. Patient is currently stable for D/C and improved since she came in. She is to continue her Coumadin and have INR levels monitored as it was subtheraputic.   Discharge  Diagnoses:  Principal Problem:   Weakness Active Problems:   Permanent atrial fibrillation (HCC)   Essential hypertension, benign   Dehydration   UTI (urinary tract infection)   Elevated CK   Chronic diastolic CHF (congestive heart failure) ([place])  1. Generalized Weakness likely from Dehydration in the Setting of E. Coli UTI with Fall out of bed: -CT Head No CT evidence for acute intracranial abnormality. Moderate-to-marked periventricular white matter hypodensity, likely small vessel disease. -C/w IVF Rehydration -PT to Eval and Treat - RECOMMEND SNF  -TSH was 1.455  2. Sepsis 2/2 to E.Coli UTI, improved -Patient was Tachycardic, had elevated WBC at 15.6 and likely source of infection in Urine -WBC now 6.1 and patient Afebrile -Patient had Cloudy Urine with Many Bacteria, Small Leukocytes, Positive Nitrites, 0-5 WBC; Urine was foul smelling -Urine C/x showed >100,000 CFU of E. Coli  -Given IVFat 75 mL/hr -Lactic Acid was 1.3 -Ceftriaxone IV Abx change to Cephalexin 500 mg po BID by Pharmacy as Sensitive -C/w Keflex 500 mg po for 7 Doses for completion of 7 Days of Abx.  -Has Hx of Enterococcus UTI  3. HTN: -Continue Metoprolol 50 mg po BID -Will Add Hydralazine if Necessary  4. Elevated CPK: improved -Found down after 24 hours. -CPK went from 483 -> 361 -Was given IVF Rehydration at 75 mL/hr  5. Atrial Fibrillation -CHADS2-VASc 5.  -Continue Warfarin Home Dose  -Patient still subtherapeutic with PT of 20.4 and INR of 1.72 -Continue Metoprolol 50 mg po BID -WILL NEED PT/INR AT FACILITY IN AM  6. Chronic Diastolic CHF: -On Metoprolol -  Has Furosemide twice weekly on outside med list and she says she no longer takes it.  -Strict I's and O's, Daily Weights, -Continue to Monitor Volume Status  7. Tremor: -Continue Clonazepam 0.25-0.5 mg BID -May need to See Neurology as outpatient.   8. Hypothyroidism: -TSH was 1.455 -Continue Levothyroxine 88 mcg po  Daily  9. 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 -D/C'd NS at 75 mL/hr  Discharge Instructions  Discharge Instructions    Call MD for:  difficulty breathing, headache or visual disturbances    Complete by:  As directed    Call MD for:  extreme fatigue    Complete by:  As directed    Call MD for:  persistant dizziness or light-headedness    Complete by:  As directed    Call MD for:  persistant nausea and vomiting    Complete by:  As directed    Call MD for:  severe uncontrolled pain    Complete by:  As directed    Call MD for:  temperature >100.4    Complete by:  As directed    Diet - low sodium heart healthy    Complete by:  As directed    Discharge instructions    Complete by:  As directed    Follow up Care at [facility]. Please Check Labwork tomorrow including PT-INR as patient is on Coumadin   Increase activity slowly    Complete by:  As directed        Medication List    TAKE these medications   acetaminophen 325 MG tablet Commonly known as:  TYLENOL Take 325 mg by mouth every 4 (four) hours as needed for headache (pain). Reported on 10/04/2015   Calcium-Vitamin D 600-200 MG-UNIT Caps Take 1 capsule by mouth daily. Reported on 10/04/2015   cephALEXin 500 MG capsule Commonly known as:  KEFLEX Take 1 capsule (500 mg total) by mouth every 12 (twelve) hours.   clonazePAM 0.5 MG tablet Commonly known as:  KLONOPIN Take 0.5-1 tablets (0.25-0.5 mg total) by mouth 2 (two) times daily. What changed:  additional instructions   feeding supplement (ENSURE ENLIVE) Liqd Take 237 mLs by mouth 2 (two) times daily between meals.   levothyroxine 88 MCG tablet Commonly known as:  SYNTHROID, LEVOTHROID Take 88 mcg by mouth daily before breakfast.   metoprolol 50 MG tablet Commonly known as:   LOPRESSOR Take 1 tablet (50 mg total) by mouth 2 (two) times daily.   multivitamin with minerals Tabs tablet Take 1 tablet by mouth daily.   OVER THE COUNTER MEDICATION Place 1 drop into both eyes daily as needed (dry eyes). Over the counter lubricating eye drop   pantoprazole 40 MG tablet Commonly known as:  PROTONIX Take 1 tablet (40 mg total) by mouth 2 (two) times daily.   Vitamin D3 1000 units Caps Take 1,000 Units by mouth daily. Reported on 10/04/2015   warfarin 5 MG tablet Commonly known as:  COUMADIN TAKE AS DIRECTED BY COUMADIN CLINIC What changed:  See the new instructions.      Contact information for after-discharge care    Destination    [facility] .   Specialty:  [place] information: [id number] [place] [phone]  Allergies  Allergen Reactions  . Iodinated Diagnostic Agents Shortness Of Breath    headache    Consultations: None  Procedures/Studies: Dg Chest 2 View  Result Date: 04/09/2016 CLINICAL DATA:  Hypertension, patient found on floor after fall EXAM: CHEST  2 VIEW COMPARISON:  10/09/2015. FINDINGS: There is no focal infiltrate or effusion. Stable mild enlargement of the cardiomediastinal. There is no pneumothorax. There is a large hiatal hernia again evident. Bones appear osteopenic. IMPRESSION: 1. No acute infiltrate or edema. 2. Large hiatal hernia 3. Mild cardiomegaly without overt failure. Electronically Signed   By: Donavan Foil M.D.   On: 04/09/2016 20:52   Ct Head Wo Contrast  Result Date: 04/09/2016 CLINICAL DATA:  Frontal headache EXAM: CT HEAD WITHOUT CONTRAST TECHNIQUE: Contiguous axial images were obtained from the base of the skull through the vertex without intravenous contrast. COMPARISON:  08/07/2012, 07/04/2012 FINDINGS: Brain: No acute territorial infarction, intracranial hemorrhage or extra-axial fluid collection. No focal  mass, mass effect or midline shift. Moderate-to-marked periventricular confluent white matter hypodensity, likely small vessel disease, unchanged. Mild atrophy. Ventricles similar in size and morphology. Vascular: No hyperdense vessels.  Carotid artery calcifications. Skull: Mild heterogeneous attenuation likely osteopenia. Mastoid air cells clear. No fracture. A dense bony lesion off the left frontal bone is unchanged. There is hyperostosis. Sinuses/Orbits: Mucosal thickening within the ethmoid sinuses. No acute orbital abnormality. Other: None IMPRESSION: 1. No CT evidence for acute intracranial abnormality. 2. Moderate-to-marked periventricular white matter hypodensity, small vessel disease. Electronically Signed   By: Donavan Foil M.D.   On: 04/09/2016 23:07     Subjective: Patient seen and examined at bedside and stated she still felt reflux and didn't want to eat the meal they brought because it had tomatoes in it. No CP/SOB/N/V or Abdominal Pain. States she is still weak and doesn't feel any better. No other complaints or concerns.  Discharge Exam: Vitals:   04/12/16 2212 04/13/16 0444  BP: (!) 159/71 (!) 149/67  Pulse: 86 81  Resp: 17 20  Temp: 97.5 F (36.4 C) 97.7 F (36.5 C)   Vitals:   04/12/16 0821 04/12/16 1319 04/12/16 2212 04/13/16 0444  BP: (!) 153/86 (!) 158/86 (!) 159/71 (!) 149/67  Pulse: 80 85 86 81  Resp:  16 17 20   Temp:  97.8 F (36.6 C) 97.5 F (36.4 C) 97.7 F (36.5 C)  TempSrc:  Oral Oral Oral  SpO2:  94% 93% 96%  Weight:      Height:       General: Pt is alert, awake, not in acute distress; Has lip tremor Cardiovascular: Irregularly Irregular, S1/S2 +, no rubs, no gallops Respiratory: CTA bilaterally, no wheezing, no rhonchi Abdominal: Soft, NT, ND, bowel sounds + Extremities: no edema, no cyanosis  The results of significant diagnostics from this hospitalization (including imaging, microbiology, ancillary and laboratory) are listed below for  reference.    Microbiology: Recent Results (from the past 240 hour(s))  Urine culture     Status: Abnormal   Collection Time: 04/09/16 11:11 PM  Result Value Ref Range Status   Specimen Description URINE, RANDOM  Final   Special Requests NONE  Final   Culture >=100,000 COLONIES/mL ESCHERICHIA COLI (A)  Final   Report Status 04/12/2016 FINAL  Final   Organism ID, Bacteria ESCHERICHIA COLI (A)  Final      Susceptibility   Escherichia coli - MIC*    AMPICILLIN >=32 RESISTANT Resistant     CEFAZOLIN 16 SENSITIVE Sensitive  CEFTRIAXONE <=1 SENSITIVE Sensitive     CIPROFLOXACIN 0.5 SENSITIVE Sensitive     GENTAMICIN <=1 SENSITIVE Sensitive     IMIPENEM <=0.25 SENSITIVE Sensitive     NITROFURANTOIN <=16 SENSITIVE Sensitive     TRIMETH/SULFA <=20 SENSITIVE Sensitive     AMPICILLIN/SULBACTAM >=32 RESISTANT Resistant     PIP/TAZO <=4 SENSITIVE Sensitive     Extended ESBL NEGATIVE Sensitive     * >=100,000 COLONIES/mL ESCHERICHIA COLI  Culture, blood (single)     Status: None (Preliminary result)   Collection Time: 04/10/16  1:45 AM  Result Value Ref Range Status   Specimen Description BLOOD RIGHT ARM  Final   Special Requests BOTTLES DRAWN AEROBIC AND ANAEROBIC 4ML  Final   Culture NO GROWTH 2 DAYS  Final   Report Status PENDING  Incomplete    Labs: BNP (last 3 results) No results for input(s): BNP in the last 8760 hours. Basic Metabolic Panel:  Recent Labs Lab 04/09/16 2057 04/10/16 0137 04/11/16 0544 04/11/16 2040 04/12/16 0529 04/13/16 0416  NA 139 140 142  --  141 142  K 4.0 4.1 3.9  --  3.9 3.9  CL 106 108 112*  --  108 112*  CO2 24 24 24   --  27 27  GLUCOSE 120* 102* 99  --  93 89  BUN 14 13 15   --  13 14  CREATININE 0.80 0.78 0.85  --  0.87 0.77  CALCIUM 9.4 8.5* 8.6*  --  8.8* 8.6*  MG  --   --  1.8  --  1.8 1.9  PHOS  --   --  2.3* 2.6  --  3.5   Liver Function Tests:  Recent Labs Lab 04/09/16 2057 04/10/16 0137 04/11/16 0544 04/12/16 0529  04/13/16 0416  AST 39 35 26 25 22   ALT 21 20 18 18 18   ALKPHOS 75 63 57 57 55  BILITOT 2.1* 1.9* 0.6 0.8 0.7  PROT 6.6 5.7* 5.1* 5.5* 5.5*  ALBUMIN 3.7 3.1* 2.7* 2.8* 2.8*   No results for input(s): LIPASE, AMYLASE in the last 168 hours. No results for input(s): AMMONIA in the last 168 hours. CBC:  Recent Labs Lab 04/09/16 2057 04/10/16 0137 04/11/16 0544 04/12/16 0529 04/13/16 0416  WBC 15.6* 12.8* 7.4 7.9 6.1  NEUTROABS 13.6*  --  4.6 4.4 3.3  HGB 16.2* 14.2 12.8 13.4 13.0  HCT 45.0 39.7 37.2 38.6 37.2  MCV 93.6 92.1 93.7 93.5 95.9  PLT 167 150 147* 156 166   Cardiac Enzymes:  Recent Labs Lab 04/09/16 2057 04/10/16 0137  CKTOTAL 483* 361*   BNP: Invalid input(s): POCBNP CBG: No results for input(s): GLUCAP in the last 168 hours. D-Dimer No results for input(s): DDIMER in the last 72 hours. Hgb A1c No results for input(s): HGBA1C in the last 72 hours. Lipid Profile No results for input(s): CHOL, HDL, LDLCALC, TRIG, CHOLHDL, LDLDIRECT in the last 72 hours. Thyroid function studies No results for input(s): TSH, T4TOTAL, T3FREE, THYROIDAB in the last 72 hours.  Invalid input(s): FREET3 Anemia work up No results for input(s): VITAMINB12, FOLATE, FERRITIN, TIBC, IRON, RETICCTPCT in the last 72 hours. Urinalysis    Component Value Date/Time   COLORURINE AMBER (A) 04/09/2016 2222   APPEARANCEUR CLOUDY (A) 04/09/2016 2222   LABSPEC 1.020 04/09/2016 2222   PHURINE 6.0 04/09/2016 2222   GLUCOSEU NEGATIVE 04/09/2016 2222   HGBUR MODERATE (A) 04/09/2016 2222   BILIRUBINUR NEGATIVE 04/09/2016 2222   KETONESUR 40 (A) 04/09/2016 2222  PROTEINUR NEGATIVE 04/09/2016 2222   UROBILINOGEN 2.0 (H) 11/18/2010 0019   NITRITE POSITIVE (A) 04/09/2016 2222   LEUKOCYTESUR SMALL (A) 04/09/2016 2222   Sepsis Labs Invalid input(s): PROCALCITONIN,  WBC,  LACTICIDVEN Microbiology Recent Results (from the past 240 hour(s))  Urine culture     Status: Abnormal   Collection  Time: 04/09/16 11:11 PM  Result Value Ref Range Status   Specimen Description URINE, RANDOM  Final   Special Requests NONE  Final   Culture >=100,000 COLONIES/mL ESCHERICHIA COLI (A)  Final   Report Status 04/12/2016 FINAL  Final   Organism ID, Bacteria ESCHERICHIA COLI (A)  Final      Susceptibility   Escherichia coli - MIC*    AMPICILLIN >=32 RESISTANT Resistant     CEFAZOLIN 16 SENSITIVE Sensitive     CEFTRIAXONE <=1 SENSITIVE Sensitive     CIPROFLOXACIN 0.5 SENSITIVE Sensitive     GENTAMICIN <=1 SENSITIVE Sensitive     IMIPENEM <=0.25 SENSITIVE Sensitive     NITROFURANTOIN <=16 SENSITIVE Sensitive     TRIMETH/SULFA <=20 SENSITIVE Sensitive     AMPICILLIN/SULBACTAM >=32 RESISTANT Resistant     PIP/TAZO <=4 SENSITIVE Sensitive     Extended ESBL NEGATIVE Sensitive     * >=100,000 COLONIES/mL ESCHERICHIA COLI  Culture, blood (single)     Status: None (Preliminary result)   Collection Time: 04/10/16  1:45 AM  Result Value Ref Range Status   Specimen Description BLOOD RIGHT ARM  Final   Special Requests BOTTLES DRAWN AEROBIC AND ANAEROBIC 4ML  Final   Culture NO GROWTH 2 DAYS  Final   Report Status PENDING  Incomplete   Time coordinating discharge: Over 30 minutes  SIGNED:  Kerney Elbe, DO [facility] 04/13/2016, 10:03 AM Pager [phone]  If 7PM-7AM, please contact night-coverage [email or web address] Password [email or web address]

## 2016-04-13 NOTE — Care Management Important Message (Signed)
Important Message  Patient Details  Name: Charlotte Castro MRN: [id number] Date of Birth: 07/28/1932   Medicare Important Message Given:  Yes    Nathen May 04/13/2016, 12:38 PM

## 2016-04-13 NOTE — Progress Notes (Signed)
Brief Nutrition Note  RD consulted for poor po intake.   Per discussion with staff, pt will be discharged today to SNF [facility]).   Pt is on a Heart Healthy diet; PO: 0%. Pt is consuming Ensure supplements per [facility] and have been ordered at discharge. This RD agrees with plan to continue with Ensure supplements due to poor PO intake. Per chart review, pt is a very selective eater. Will defer further nutritional issues to RD and nutritional services staff at SNF.   RD will defer further nutritional needs to SNF. If there are further nutritional needs to be addressed prior to discharge, please reconsult RD.   Pleasant Britz A. Jimmye Norman, RD, LDN, CDE Pager: [phone] After hours Pager: [phone]

## 2016-04-13 NOTE — Progress Notes (Signed)
Patient will DC to: [facility] Anticipated DC date: 04/13/16 Family notified: Son Transport by: Corey Harold   Per MD patient ready for DC to [place]. RN, patient, patient's family, and facility notified of DC. Discharge Summary sent to facility. RN given number for report. DC packet on chart. Ambulance transport requested for patient.   CSW signing off.  Cedric Fishman, [place] Social Worker [phone]

## 2016-04-13 NOTE — Clinical Social Work Placement (Signed)
   CLINICAL SOCIAL WORK PLACEMENT  NOTE  Date:  04/13/2016  Patient Details  Name: Charlotte Castro MRN: [id number] Date of Birth: [birth year]  Clinical Social Work is seeking post-discharge placement for this patient at the [place] level of care (*CSW will initial, date and re-position this form in  chart as items are completed):  Yes   Patient/family provided with [place] Work Department's list of facilities offering this level of care within the geographic area requested by the patient (or if unable, by the patient's family).  Yes   Patient/family informed of their freedom to choose among providers that offer the needed level of care, that participate in Medicare, Medicaid or managed care program needed by the patient, have an available bed and are willing to accept the patient.  Yes   Patient/family informed of [place]'s ownership interest in [facility] and [facility], as well as of the fact that they are under no obligation to receive care at these facilities.  PASRR submitted to EDS on 04/12/16     PASRR number received on 04/12/16     Existing PASRR number confirmed on       FL2 transmitted to all facilities in geographic area requested by pt/family on 04/12/16     FL2 transmitted to all facilities within larger geographic area on       Patient informed that his/her managed care company has contracts with or will negotiate with certain facilities, including the following:        Yes   Patient/family informed of bed offers received.  Patient chooses bed at [place] recommends and patient chooses bed at      Patient to be transferred to [facility] on 04/13/16.  Patient to be transferred to facility by [facility]     Patient family notified on 04/13/16 of transfer.  Name of family member notified:  Son and daughter     PHYSICIAN Please sign FL2     Additional Comment:     _______________________________________________ [name], [place] 04/13/2016, 2:14 PM

## 2016-04-14 ENCOUNTER — Non-Acute Institutional Stay (SKILLED_NURSING_FACILITY): Payer: Medicare Other | Admitting: Internal Medicine

## 2016-04-14 ENCOUNTER — Encounter: Payer: Self-pay | Admitting: Internal Medicine

## 2016-04-14 DIAGNOSIS — R410 Disorientation, unspecified: Secondary | ICD-10-CM

## 2016-04-14 DIAGNOSIS — R748 Abnormal levels of other serum enzymes: Secondary | ICD-10-CM | POA: Diagnosis not present

## 2016-04-14 DIAGNOSIS — N390 Urinary tract infection, site not specified: Secondary | ICD-10-CM | POA: Diagnosis not present

## 2016-04-14 DIAGNOSIS — E860 Dehydration: Secondary | ICD-10-CM | POA: Diagnosis not present

## 2016-04-14 DIAGNOSIS — I482 Chronic atrial fibrillation: Secondary | ICD-10-CM | POA: Diagnosis not present

## 2016-04-14 DIAGNOSIS — R251 Tremor, unspecified: Secondary | ICD-10-CM

## 2016-04-14 DIAGNOSIS — I4821 Permanent atrial fibrillation: Secondary | ICD-10-CM

## 2016-04-14 LAB — POCT INR: INR: 1.7 — AB (ref 0.9–1.1)

## 2016-04-14 NOTE — Assessment & Plan Note (Signed)
Clinically improved, focus on improving oral hydration

## 2016-04-14 NOTE — Assessment & Plan Note (Signed)
Maintain lower therapeutic PT/INR because of weakness and risk of falling

## 2016-04-14 NOTE — Assessment & Plan Note (Addendum)
According to literature beta blockers, anti-emetics, and cephalosporins have been reported to cause acute delirium. Most likely would be the antinausea medications prescribed. Haldol trial qhs ( Up to Date) Psychiatry consultation

## 2016-04-14 NOTE — Assessment & Plan Note (Signed)
In the context of dehydration and being floor bound for 24 hours Improved with IV hydration

## 2016-04-14 NOTE — Assessment & Plan Note (Signed)
She'll complete 7 days of Keflex 500 mg twice a day for Escherichia coli urinary tract infection

## 2016-04-14 NOTE — Patient Instructions (Signed)
See Current Assessment & Plan in Problem List under specific Diagnosis 
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
## 2016-04-14 NOTE — Assessment & Plan Note (Signed)
Neurology follow-up indicated

## 2016-04-15 ENCOUNTER — Encounter: Payer: Self-pay | Admitting: *Deleted

## 2016-04-15 LAB — CULTURE, BLOOD (SINGLE): CULTURE: NO GROWTH

## 2016-04-21 ENCOUNTER — Non-Acute Institutional Stay (SKILLED_NURSING_FACILITY): Payer: Medicare Other | Admitting: Nurse Practitioner

## 2016-04-21 ENCOUNTER — Encounter: Payer: Self-pay | Admitting: Nurse Practitioner

## 2016-04-21 DIAGNOSIS — I5032 Chronic diastolic (congestive) heart failure: Secondary | ICD-10-CM

## 2016-04-21 DIAGNOSIS — R251 Tremor, unspecified: Secondary | ICD-10-CM

## 2016-04-21 DIAGNOSIS — I482 Chronic atrial fibrillation: Secondary | ICD-10-CM | POA: Diagnosis not present

## 2016-04-21 DIAGNOSIS — E039 Hypothyroidism, unspecified: Secondary | ICD-10-CM | POA: Diagnosis not present

## 2016-04-21 DIAGNOSIS — R531 Weakness: Secondary | ICD-10-CM

## 2016-04-21 DIAGNOSIS — N390 Urinary tract infection, site not specified: Secondary | ICD-10-CM | POA: Diagnosis not present

## 2016-04-21 DIAGNOSIS — K219 Gastro-esophageal reflux disease without esophagitis: Secondary | ICD-10-CM | POA: Diagnosis not present

## 2016-04-21 DIAGNOSIS — I10 Essential (primary) hypertension: Secondary | ICD-10-CM | POA: Diagnosis not present

## 2016-04-21 DIAGNOSIS — I4821 Permanent atrial fibrillation: Secondary | ICD-10-CM

## 2016-04-21 DIAGNOSIS — R410 Disorientation, unspecified: Secondary | ICD-10-CM

## 2016-04-21 MED ORDER — LEVOTHYROXINE SODIUM 88 MCG PO TABS: 88.0000 ug | ORAL_TABLET | Freq: Every day | ORAL | 0 refills | Status: DC

## 2016-04-21 MED ORDER — WARFARIN SODIUM 5 MG PO TABS: ORAL_TABLET | ORAL | 0 refills | Status: DC

## 2016-04-21 MED ORDER — PANTOPRAZOLE SODIUM 40 MG PO TBEC: 40.0000 mg | DELAYED_RELEASE_TABLET | Freq: Two times a day (BID) | ORAL | 0 refills | Status: DC

## 2016-04-21 MED ORDER — METOPROLOL TARTRATE 50 MG PO TABS: 50.0000 mg | ORAL_TABLET | Freq: Two times a day (BID) | ORAL | 0 refills | Status: DC

## 2016-04-21 NOTE — Progress Notes (Signed)
Nursing Home Location:  [facility]  Place of Service: SNF (31)  PCP: FULP, CAMMIE, MD  Allergies  Allergen Reactions  . Iodinated Diagnostic Agents Shortness Of Breath    headache    Chief Complaint  Patient presents with  . Discharge Note    HPI:  Patient is a 82 y.o. female seen today at [facility] for discharge home. Plan was for pt to stay in facility for 1 week before returning home. Pt with hx of  a past medical history significant for HTN, Afib on warfarin, hypothyroidism, Parkinson, hiatal hernia and anxietywho went to the hospital after she was found down on the floor at home with soiled clothing. Pt was worked up for possible stroke which was negative but found to have UTI and weakness. Has bene at [facility] for acute rehab due to generalized weakness. Pt has completed keflex and without urinary symptoms. When pt initially got to [facility] she had episode of dilerium. Was place on PO haldol qhs. She has not had any recurrent episodes and has been alert and oriented since. Patient currently doing well with therapy, now stable to discharge home with home health.   Review of Systems:  Review of Systems  Constitutional: Negative for activity change, appetite change, fatigue and unexpected weight change.  HENT: Negative for congestion and hearing loss.   Eyes: Negative.   Respiratory: Negative for cough and shortness of breath.   Cardiovascular: Negative for chest pain, palpitations and leg swelling.  Gastrointestinal: Negative for abdominal pain, constipation and diarrhea.  Genitourinary: Negative for difficulty urinating and dysuria.  Musculoskeletal: Negative for arthralgias and myalgias.  Skin: Negative for color change and wound.  Neurological: Negative for dizziness and weakness.  Psychiatric/Behavioral: Negative for agitation, behavioral problems and confusion.    Past Medical History:  Diagnosis Date  . Atrial fibrillation ([place])   . Atrial  flutter ([place])    typical appearing  . Atrial tachycardia ([place])    ablated 11/17/10  by JA  from the [facility] of the aorta  . Dizziness    chronic and of an unclear etiology  . Gastritis   . GERD (gastroesophageal reflux disease)   . HTN (hypertension)   . Hyperlipemia   . Hypothyroidism   . Obesity   . Osteoporosis   . Vitamin D deficiency    Past Surgical History:  Procedure Laterality Date  . ATRIAL ABLATION SURGERY  11/17/10   Atrial tachycardia arising from [facility] of the aorta ablated by JA   Social History:   reports that she has never smoked. She has never used smokeless tobacco. She reports that she does not drink alcohol or use drugs.  Family History  Problem Relation Age of Onset  . Heart attack Father   . Hypertension Father     Medications: Patient's Medications  New Prescriptions   No medications on file  Previous Medications   ACETAMINOPHEN (TYLENOL) 325 MG TABLET    Take 325 mg by mouth every 4 (four) hours as needed for headache (pain). Reported on 10/04/2015   AMBULATORY NON FORMULARY MEDICATION    Give 120 ml MedPass daily.   CALCIUM CARBONATE-VITAMIN D (CALCIUM-VITAMIN D) 600-200 MG-UNIT CAPS    Take 1 capsule by mouth daily. Reported on 10/04/2015   CHOLECALCIFEROL (VITAMIN D3) 1000 UNITS CAPS    Take 1,000 Units by mouth daily. Reported on 10/04/2015   CLONAZEPAM (KLONOPIN) 0.5 MG TABLET    Take 0.5-1 tablets (0.25-0.5 mg total) by mouth 2 (two)  times daily.   HALOPERIDOL (HALDOL) 2 MG TABLET    Take 2 mg by mouth at bedtime.   LEVOTHYROXINE (SYNTHROID, LEVOTHROID) 88 MCG TABLET    Take 88 mcg by mouth daily before breakfast.    METOPROLOL (LOPRESSOR) 50 MG TABLET    Take 1 tablet (50 mg total) by mouth 2 (two) times daily.   MULTIPLE VITAMIN (MULTIVITAMIN WITH MINERALS) TABS TABLET    Take 1 tablet by mouth daily.   OVER THE COUNTER MEDICATION    Place 1 drop into both eyes daily as needed (dry eyes). Over the counter lubricating eye drop   PANTOPRAZOLE (PROTONIX) 40  MG TABLET    Take 1 tablet (40 mg total) by mouth 2 (two) times daily.   WARFARIN (COUMADIN) 5 MG TABLET    Take 5 mg by mouth daily.  Modified Medications   No medications on file  Discontinued Medications   CEPHALEXIN (KEFLEX) 500 MG CAPSULE    Take 1 capsule (500 mg total) by mouth every 12 (twelve) hours.   FEEDING SUPPLEMENT, ENSURE ENLIVE, (ENSURE ENLIVE) LIQD    Take 237 mLs by mouth 2 (two) times daily between meals.   WARFARIN (COUMADIN) 5 MG TABLET    TAKE AS DIRECTED BY COUMADIN CLINIC     Physical Exam: Vitals:   04/21/16 0858  BP: 110/78  Pulse: 93  Resp: 20  Temp: 97 F (36.1 C)  SpO2: 92%  Weight: 216 lb 10.1 oz (98.3 kg)  Height: 5\' 8"  (1.727 m)    Physical Exam  Constitutional: She is oriented to person, place, and time. She appears well-developed and well-nourished. No distress.  HENT:  Head: Normocephalic and atraumatic.  Mouth/Throat: Oropharynx is clear and moist. No oropharyngeal exudate.  Eyes: Conjunctivae are normal. Pupils are equal, round, and reactive to light.  Neck: Normal range of motion. Neck supple.  Cardiovascular: Normal rate and normal heart sounds.  An irregular rhythm present.  Pulmonary/Chest: Effort normal and breath sounds normal.  Abdominal: Soft. Bowel sounds are normal.  Musculoskeletal: She exhibits no edema or tenderness.  Neurological: She is alert and oriented to person, place, and time.  Skin: Skin is warm and dry. She is not diaphoretic.  Psychiatric: She has a normal mood and affect.    Labs reviewed: Basic Metabolic Panel:  Recent Labs  04/11/16 0544 04/11/16 2040 04/12/16 0529 04/13/16 0416  NA 142  --  141 142  K 3.9  --  3.9 3.9  CL 112*  --  108 112*  CO2 24  --  27 27  GLUCOSE 99  --  93 89  BUN 15  --  13 14  CREATININE 0.85  --  0.87 0.77  CALCIUM 8.6*  --  8.8* 8.6*  MG 1.8  --  1.8 1.9  PHOS 2.3* 2.6  --  3.5   Liver Function Tests:  Recent Labs  04/11/16 0544 04/12/16 0529 04/13/16 0416    AST 26 25 22   ALT 18 18 18   ALKPHOS 57 57 55  BILITOT 0.6 0.8 0.7  PROT 5.1* 5.5* 5.5*  ALBUMIN 2.7* 2.8* 2.8*    Recent Labs  10/09/15 1807  LIPASE 23   No results for input(s): AMMONIA in the last 8760 hours. CBC:  Recent Labs  04/11/16 0544 04/12/16 0529 04/13/16 0416  WBC 7.4 7.9 6.1  NEUTROABS 4.6 4.4 3.3  HGB 12.8 13.4 13.0  HCT 37.2 38.6 37.2  MCV 93.7 93.5 95.9  PLT 147* 156 166  TSH:  Recent Labs  10/04/15 1337 10/09/15 1807 04/10/16 0137  TSH 3.329 3.141 1.455   A1C: No results found for: HGBA1C Lipid Panel: No results for input(s): CHOL, HDL, LDLCALC, TRIG, CHOLHDL, LDLDIRECT in the last 8760 hours.  Radiological Exams: Dg Chest 2 View  Result Date: 04/09/2016 CLINICAL DATA:  Hypertension, patient found on floor after fall EXAM: CHEST  2 VIEW COMPARISON:  10/09/2015. FINDINGS: There is no focal infiltrate or effusion. Stable mild enlargement of the cardiomediastinal. There is no pneumothorax. There is a large hiatal hernia again evident. Bones appear osteopenic. IMPRESSION: 1. No acute infiltrate or edema. 2. Large hiatal hernia 3. Mild cardiomegaly without overt failure. Electronically Signed   By: Donavan Foil M.D.   On: 04/09/2016 20:52   Ct Head Wo Contrast  Result Date: 04/09/2016 CLINICAL DATA:  Frontal headache EXAM: CT HEAD WITHOUT CONTRAST TECHNIQUE: Contiguous axial images were obtained from the base of the skull through the vertex without intravenous contrast. COMPARISON:  08/07/2012, 07/04/2012 FINDINGS: Brain: No acute territorial infarction, intracranial hemorrhage or extra-axial fluid collection. No focal mass, mass effect or midline shift. Moderate-to-marked periventricular confluent white matter hypodensity, likely small vessel disease, unchanged. Mild atrophy. Ventricles similar in size and morphology. Vascular: No hyperdense vessels.  Carotid artery calcifications. Skull: Mild heterogeneous attenuation likely osteopenia. Mastoid air  cells clear. No fracture. A dense bony lesion off the left frontal bone is unchanged. There is hyperostosis. Sinuses/Orbits: Mucosal thickening within the ethmoid sinuses. No acute orbital abnormality. Other: None IMPRESSION: 1. No CT evidence for acute intracranial abnormality. 2. Moderate-to-marked periventricular white matter hypodensity, small vessel disease. Electronically Signed   By: Donavan Foil M.D.   On: 04/09/2016 23:07    Assessment/Plan 1. Permanent atrial fibrillation (HCC) Rate controlled, cont on lopressor and coumadin for anticoagulation.  - metoprolol (LOPRESSOR) 50 MG tablet; Take 1 tablet (50 mg total) by mouth 2 (two) times daily.  Dispense: 60 tablet; Refill: 0 - warfarin (COUMADIN) 5 MG tablet; 5 mg by mouth daily except 1/2 tablet (2.5 mg) on Tuesday, Thursday and Sunday  Dispense: 20 tablet; Refill: 0  2. Essential hypertension, benign Stable on current regimen  - metoprolol (LOPRESSOR) 50 MG tablet; Take 1 tablet (50 mg total) by mouth 2 (two) times daily.  Dispense: 60 tablet; Refill: 0  3. Chronic diastolic CHF (congestive heart failure) (HCC) Stable, appears euvolemic, conts lopressor   4. Acute delirium Has improved at this time. Will dc haldol   5. Urinary tract infection without hematuria, site unspecified Completed keflex, no further symptoms  6. Tremor Outpatient workup, possible neurology referral if indicated by pcp.   7. Weakness Has improved with therapy. Will be discharged on home health therapies   8. Gastroesophageal reflux disease without esophagitis - pantoprazole (PROTONIX) 40 MG tablet; Take 1 tablet (40 mg total) by mouth 2 (two) times daily.  Dispense: 60 tablet; Refill: 0  9. Hypothyroidism, unspecified type - levothyroxine (SYNTHROID, LEVOTHROID) 88 MCG tablet; Take 1 tablet (88 mcg total) by mouth daily before breakfast.  Dispense: 30 tablet; Refill: 0   pt is stable for discharge-will need PT/OT/Nursing (for INR) per home health.  No DME needed. Rx written.  will need to follow up with PCP within 2 weeks.    Carlos American. Harle Battiest  [facility] [phone] 8 am - 5 pm) [phone] (after hours)

## 2016-04-22 ENCOUNTER — Other Ambulatory Visit: Payer: Self-pay | Admitting: *Deleted

## 2016-04-22 NOTE — Patient Outreach (Signed)
[facility]) Care Management  04/22/2016  Charlotte Castro [birth year] [id number]   Met with patient at bedside. Patient reports she is discharging from facility today. Patient can confirm Her MD, she denies any issues with obtaining medications. She lives alone but her family assists her as needed. Patient will have home care services upon discharge. No [facility] care management needs identified.  Cameron Ali, SW at facility confirms patient discharge plan.  RNCM will sign off. Royetta Crochet. Laymond Purser, RN, BSN, CCM  [facility] care coordinator [phone]

## 2016-04-27 ENCOUNTER — Other Ambulatory Visit: Payer: Self-pay | Admitting: Licensed Clinical Social Worker
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
## 2016-05-05 ENCOUNTER — Encounter (HOSPITAL_COMMUNITY): Payer: Self-pay | Admitting: Emergency Medicine

## 2016-05-05 ENCOUNTER — Emergency Department (HOSPITAL_COMMUNITY)
Admission: EM | Admit: 2016-05-05 | Discharge: 2016-05-05 | Disposition: A | Discharge status: Home / Self Care | Payer: Medicare Other | Attending: Emergency Medicine | Admitting: Emergency Medicine

## 2016-05-05 DIAGNOSIS — R112 Nausea with vomiting, unspecified: Secondary | ICD-10-CM | POA: Diagnosis not present

## 2016-05-05 DIAGNOSIS — R1013 Epigastric pain: Secondary | ICD-10-CM | POA: Diagnosis present

## 2016-05-05 DIAGNOSIS — Z79899 Other long term (current) drug therapy: Secondary | ICD-10-CM | POA: Diagnosis not present

## 2016-05-05 DIAGNOSIS — R110 Nausea: Secondary | ICD-10-CM

## 2016-05-05 DIAGNOSIS — I10 Essential (primary) hypertension: Secondary | ICD-10-CM | POA: Insufficient documentation

## 2016-05-05 DIAGNOSIS — N390 Urinary tract infection, site not specified: Secondary | ICD-10-CM | POA: Insufficient documentation

## 2016-05-05 DIAGNOSIS — E039 Hypothyroidism, unspecified: Secondary | ICD-10-CM | POA: Insufficient documentation

## 2016-05-05 LAB — COMPREHENSIVE METABOLIC PANEL
ALT: 17 U/L (ref 14–54)
AST: 26 U/L (ref 15–41)
Albumin: 3.7 g/dL (ref 3.5–5.0)
Alkaline Phosphatase: 78 U/L (ref 38–126)
Anion gap: 8 (ref 5–15)
BUN: 17 mg/dL (ref 6–20)
CHLORIDE: 107 mmol/L (ref 101–111)
CO2: 25 mmol/L (ref 22–32)
Calcium: 9.9 mg/dL (ref 8.9–10.3)
Creatinine, Ser: 1.02 mg/dL — ABNORMAL HIGH (ref 0.44–1.00)
GFR, EST AFRICAN AMERICAN: 58 mL/min — AB (ref >60)
GFR, EST NON AFRICAN AMERICAN: 50 mL/min — AB (ref >60)
Glucose, Bld: 96 mg/dL (ref 65–99)
POTASSIUM: 4.3 mmol/L (ref 3.5–5.1)
SODIUM: 140 mmol/L (ref 135–145)
Total Bilirubin: 0.5 mg/dL (ref 0.3–1.2)
Total Protein: 6.6 g/dL (ref 6.5–8.1)

## 2016-05-05 LAB — CBC
HEMATOCRIT: 44.9 % (ref 36.0–46.0)
Hemoglobin: 15.9 g/dL — ABNORMAL HIGH (ref 12.0–15.0)
MCH: 33.3 pg (ref 26.0–34.0)
MCHC: 35.4 g/dL (ref 30.0–36.0)
MCV: 93.9 fL (ref 78.0–100.0)
Platelets: 215 10*3/uL (ref 150–400)
RBC: 4.78 MIL/uL (ref 3.87–5.11)
RDW: 13.3 % (ref 11.5–15.5)
WBC: 10.3 10*3/uL (ref 4.0–10.5)

## 2016-05-05 LAB — URINE MICROSCOPIC-ADD ON

## 2016-05-05 LAB — URINALYSIS, ROUTINE W REFLEX MICROSCOPIC
Bilirubin Urine: NEGATIVE
GLUCOSE, UA: NEGATIVE mg/dL
Ketones, ur: NEGATIVE mg/dL
Nitrite: POSITIVE — AB
PH: 7 (ref 5.0–8.0)
Protein, ur: 30 mg/dL — AB
SPECIFIC GRAVITY, URINE: 1.015 (ref 1.005–1.030)

## 2016-05-05 LAB — LIPASE, BLOOD: Lipase: 37 U/L (ref 11–51)

## 2016-05-05 LAB — PROTIME-INR
INR: 1.71
Prothrombin Time: 20.2 seconds — ABNORMAL HIGH (ref 11.4–15.2)

## 2016-05-05 LAB — I-STAT TROPONIN, ED: TROPONIN I, POC: 0.01 ng/mL (ref 0.00–0.08)

## 2016-05-05 MED ORDER — CEPHALEXIN 500 MG PO CAPS: 500.0000 mg | ORAL_CAPSULE | Freq: Three times a day (TID) | ORAL | 0 refills | Status: DC

## 2016-05-05 MED ORDER — ONDANSETRON 4 MG PO TBDP: 4.0000 mg | ORAL_TABLET | Freq: Three times a day (TID) | ORAL | 0 refills | Status: DC | PRN

## 2016-05-05 NOTE — ED Provider Notes (Signed)
[facility] DEPT Provider Note   CSN: [id number] Arrival date & time: 05/05/16  1600     History   Chief Complaint Chief Complaint  Patient presents with  . Abdominal Pain    HPI Charlotte Castro is a 82 y.o. female.  HPI   Abdominal pain, epigastric, "raw feeling", feels like a soreness, no aching, does burn some 2 weeks of pain Stomach doctor called in medicine, called in same one had been taking from hospital, but the medicine has not helping Pain making "me nervous" Heart beats fast with it when waking up in the morning, wake up with palpitations every day, the medicines don't help Trying to drink soup, soft diet.  Soup seems to help it. Cold seems to help, especially cold milk. This has been going on for about 1 year.  Found hiatal hernia.  Similar to pain what had.  Vomiting for about 2 weeks, just in AM. No chest pain.  When do anything feels dyspnea, going on for a year or more, no acute changes.   Past Medical History:  Diagnosis Date  . Atrial fibrillation ([place])   . Atrial flutter ([place])    typical appearing  . Atrial tachycardia ([place])    ablated 11/17/10  by JA  from the [facility] of the aorta  . Dizziness    chronic and of an unclear etiology  . Gastritis   . GERD (gastroesophageal reflux disease)   . HTN (hypertension)   . Hyperlipemia   . Hypothyroidism   . Obesity   . Osteoporosis   . Vitamin D deficiency     Patient Active Problem List   Diagnosis Date Noted  . Acute delirium 04/14/2016  . Tremor 04/14/2016  . Weakness 04/10/2016  . Elevated CK 04/10/2016  . Chronic diastolic CHF (congestive heart failure) ([place]) 04/10/2016  . UTI (urinary tract infection) 04/09/2016  . Dehydration   . Fatigue 03/29/2014  . Essential hypertension, benign 01/18/2014  . Long term current use of anticoagulant therapy 01/18/2014  . Encounter for therapeutic drug monitoring 07/13/2013  . Bradycardia 09/16/2012  . Permanent atrial fibrillation ([place]) 06/10/2011  .  Dizziness 12/24/2010    Past Surgical History:  Procedure Laterality Date  . ATRIAL ABLATION SURGERY  11/17/10   Atrial tachycardia arising from [place] of the aorta ablated by JA    OB History    No data available       Home Medications    Prior to Admission medications   Medication Sig Start Date End Date Taking? Authorizing Provider  acetaminophen (TYLENOL) 325 MG tablet Take 325 mg by mouth every 4 (four) hours as needed for headache (pain). Reported on 10/04/2015    Historical Provider, MD  AMBULATORY NON FORMULARY MEDICATION Give 120 ml MedPass daily.    Historical Provider, MD  Calcium Carbonate-Vitamin D (CALCIUM-VITAMIN D) 600-200 MG-UNIT CAPS Take 1 capsule by mouth daily. Reported on 10/04/2015    Historical Provider, MD  cephALEXin (KEFLEX) 500 MG capsule Take 1 capsule (500 mg total) by mouth 3 (three) times daily. 05/05/16 05/15/16  Gareth Morgan, MD  Cholecalciferol (VITAMIN D3) 1000 UNITS CAPS Take 1,000 Units by mouth daily. Reported on 10/04/2015    Historical Provider, MD  clonazePAM (KLONOPIN) 0.5 MG tablet Take 0.5-1 tablets (0.25-0.5 mg total) by mouth 2 (two) times daily. 04/13/16   Kerney Elbe, DO  levothyroxine (SYNTHROID, LEVOTHROID) 88 MCG tablet Take 1 tablet (88 mcg total) by mouth daily before breakfast. 04/21/16   Lauree Chandler, NP  metoprolol (LOPRESSOR) 50 MG tablet Take 1 tablet (50 mg total) by mouth 2 (two) times daily. 04/21/16   Lauree Chandler, NP  Multiple Vitamin (MULTIVITAMIN WITH MINERALS) TABS tablet Take 1 tablet by mouth daily.    Historical Provider, MD  ondansetron (ZOFRAN ODT) 4 MG disintegrating tablet Take 1 tablet (4 mg total) by mouth every 8 (eight) hours as needed for nausea or vomiting. 05/05/16   Gareth Morgan, MD  OVER THE COUNTER MEDICATION Place 1 drop into both eyes daily as needed (dry eyes). Over the counter lubricating eye drop    Historical Provider, MD  pantoprazole (PROTONIX) 40 MG tablet Take 1 tablet (40 mg total)  by mouth 2 (two) times daily. 04/21/16   Lauree Chandler, NP  warfarin (COUMADIN) 5 MG tablet 5 mg by mouth daily except 1/2 tablet (2.5 mg) on Tuesday, Thursday and Sunday 04/21/16   Lauree Chandler, NP    Family History Family History  Problem Relation Age of Onset  . Heart attack Father   . Hypertension Father     Social History Social History  Substance Use Topics  . Smoking status: Never Smoker  . Smokeless tobacco: Never Used  . Alcohol use No     Allergies   Iodinated diagnostic agents and Penicillins   Review of Systems Review of Systems  Constitutional: Negative for fever.  HENT: Negative for sore throat.   Eyes: Negative for visual disturbance.  Respiratory: Negative for cough and shortness of breath (82yr).   Cardiovascular: Negative for chest pain.  Gastrointestinal: Positive for abdominal pain and nausea.  Genitourinary: Negative for difficulty urinating.  Musculoskeletal: Negative for back pain and neck pain.  Skin: Negative for rash.  Neurological: Negative for syncope and headaches.     Physical Exam Updated Vital Signs BP 156/88   Pulse 78   Temp 98 F (36.7 C) (Oral)   Resp 15   SpO2 100%   Physical Exam  Constitutional: She is oriented to person, place, and time. She appears well-developed and well-nourished. No distress.  HENT:  Head: Normocephalic and atraumatic.  Eyes: Conjunctivae and EOM are normal.  Neck: Normal range of motion.  Cardiovascular: Normal rate, regular rhythm, normal heart sounds and intact distal pulses.  Exam reveals no gallop and no friction rub.   No murmur heard. Pulmonary/Chest: Effort normal and breath sounds normal. No respiratory distress. She has no wheezes. She has no rales.  Abdominal: Soft. She exhibits no distension. There is tenderness (mild epigastric). There is no guarding.  Musculoskeletal: She exhibits no edema or tenderness.  Neurological: She is alert and oriented to person, place, and time.    Skin: Skin is warm and dry. No rash noted. She is not diaphoretic. No erythema.  Nursing note and vitals reviewed.    ED Treatments / Results  Labs (all labs ordered are listed, but only abnormal results are displayed) Labs Reviewed  COMPREHENSIVE METABOLIC PANEL - Abnormal; Notable for the following:       Result Value   Creatinine, Ser 1.02 (*)    GFR calc non Af Amer 50 (*)    GFR calc Af Amer 58 (*)    All other components within normal limits  CBC - Abnormal; Notable for the following:    Hemoglobin 15.9 (*)    All other components within normal limits  URINALYSIS, ROUTINE W REFLEX MICROSCOPIC (NOT AT [facility]) - Abnormal; Notable for the following:    APPearance CLOUDY (*)    Hgb urine  dipstick MODERATE (*)    Protein, ur 30 (*)    Nitrite POSITIVE (*)    Leukocytes, UA SMALL (*)    All other components within normal limits  PROTIME-INR - Abnormal; Notable for the following:    Prothrombin Time 20.2 (*)    All other components within normal limits  URINE MICROSCOPIC-ADD ON - Abnormal; Notable for the following:    Squamous Epithelial / LPF 6-30 (*)    Bacteria, UA MANY (*)    All other components within normal limits  URINE CULTURE  LIPASE, BLOOD  I-STAT TROPOININ, ED    EKG  EKG Interpretation  Date/Time:  Wednesday May 05 2016 16:56:17 EST Ventricular Rate:  84 PR Interval:    QRS Duration: 93 QT Interval:  366 QTC Calculation: 433 R Axis:   60 Text Interpretation:  Atrial fibrillation Minimal ST elevation, anterior leads Baseline wander in lead(s) V1 V2 No significant change since last tracing Confirmed by [facility] MD, Junie Panning ([id number]) on 05/05/2016 6:19:36 PM       Radiology No results found.  Procedures Procedures (including critical care time)  Medications Ordered in ED Medications - No data to display   Initial Impression / Assessment and Plan / ED Course  I have reviewed the triage vital signs and the nursing notes.  Pertinent labs &  imaging results that were available during my care of the patient were reviewed by me and considered in my medical decision making (see chart for details).  Clinical Course    82 year old female with a history of atrial fibrillation, atrial flutter, gastritis, hypertension, hyperlipidemia, chronic diastolic CHF presents with concern for abdominal pain. Given epigastric nature, EKG and troponin were ordered which did not show any significant findings. Overall doubt cardiac etiology as patient exhibits some abdominal tenderness, no chest pain, no dyspnea. Given duration of symptoms, pt afebrile, exam have low suspicion for mesenteric ischemia, appendicitis, cholecystitis.  Patient reports that the pain is improved by eating and drinking certain types of foods, and suspect possible duodenal ulcer given this improvement. Recommend continuing twice a day PPI. Reviewed patient's prior imaging which includes a CT from April which did not show any sign of AAA. Patient also describes nausea.  Urinalysis is again concerning for UTI with positive nitrites, many bacteria and leukocytes. Looked at culture from previous hospitalization which was sensitive to cephalosporins, and we'll give prescription for Keflex.  Suspect UTI is contributing to nausea. Gave rx for zofran. Patient discharged in stable condition with understanding of reasons to return.   Final Clinical Impressions(s) / ED Diagnoses   Final diagnoses:  Epigastric pain  Urinary tract infection without hematuria, site unspecified  Nausea    New Prescriptions Discharge Medication List as of 05/05/2016  7:15 PM    START taking these medications   Details  cephALEXin (KEFLEX) 500 MG capsule Take 1 capsule (500 mg total) by mouth 3 (three) times daily., Starting Wed 05/05/2016, Until Sat 05/15/2016, Print    ondansetron (ZOFRAN ODT) 4 MG disintegrating tablet Take 1 tablet (4 mg total) by mouth every 8 (eight) hours as needed for nausea or vomiting.,  Starting Wed 05/05/2016, Print         Gareth Morgan, MD 05/06/16 0230

## 2016-05-05 NOTE — ED Notes (Signed)
MD Schlossman at the bedside  

## 2016-05-05 NOTE — ED Triage Notes (Signed)
Pt recently hospitalized for hiatal hernia. Pt states the pain has continued and the medication she was given has not helped. Pt has been having diarrhea and been vomiting. Pt states she has a soreness and burning in middle of her abd.

## 2016-05-05 NOTE — ED Notes (Signed)
Attempting to get Urine sample with patient on the bedside commode

## 2016-05-07 ENCOUNTER — Other Ambulatory Visit: Payer: Self-pay | Admitting: Licensed Clinical Social Worker

## 2016-05-07 NOTE — Patient Outreach (Signed)
[facility]) Care Management  05/07/2016  Charlotte Castro 01-Oct-1932 [id number]  Assessment- CSW completed call to patient. She states that she did not use our arranged transportation on 04/29/16 and canceled the arrangements because she did not know if the transportation arrangements were still set. However, she states that she will still want transportation through [facility] for her appointment on 05/13/16. Patient went to the ED yesterday for diarrhea and vomiting and was informed she had a bladder infection.    Plan-CSW will update [facility] Care Management Assistant. Case will not be opened.  Eula Fried, BSW, MSW, [email or web address] Phone: [phone] Fax: [phone]

## 2016-05-08 LAB — URINE CULTURE

## 2016-05-09 ENCOUNTER — Telehealth (HOSPITAL_BASED_OUTPATIENT_CLINIC_OR_DEPARTMENT_OTHER): Payer: Self-pay

## 2016-05-09 NOTE — Progress Notes (Signed)
ED Antimicrobial Stewardship Positive Culture Follow Up   Charlotte Castro is an 82 y.o. female who presented to [facility] on 05/05/2016 with a chief complaint of abdominal pain with recent E.coli UTI.  Chief Complaint  Patient presents with  . Abdominal Pain    Recent Results (from the past 720 hour(s))  Urine culture     Status: Abnormal   Collection Time: 04/09/16 11:11 PM  Result Value Ref Range Status   Specimen Description URINE, RANDOM  Final   Special Requests NONE  Final   Culture >=100,000 COLONIES/mL ESCHERICHIA COLI (A)  Final   Report Status 04/12/2016 FINAL  Final   Organism ID, Bacteria ESCHERICHIA COLI (A)  Final      Susceptibility   Escherichia coli - MIC*    AMPICILLIN >=32 RESISTANT Resistant     CEFAZOLIN 16 SENSITIVE Sensitive     CEFTRIAXONE <=1 SENSITIVE Sensitive     CIPROFLOXACIN 0.5 SENSITIVE Sensitive     GENTAMICIN <=1 SENSITIVE Sensitive     IMIPENEM <=0.25 SENSITIVE Sensitive     NITROFURANTOIN <=16 SENSITIVE Sensitive     TRIMETH/SULFA <=20 SENSITIVE Sensitive     AMPICILLIN/SULBACTAM >=32 RESISTANT Resistant     PIP/TAZO <=4 SENSITIVE Sensitive     Extended ESBL NEGATIVE Sensitive     * >=100,000 COLONIES/mL ESCHERICHIA COLI  Culture, blood (single)     Status: None   Collection Time: 04/10/16  1:45 AM  Result Value Ref Range Status   Specimen Description BLOOD RIGHT ARM  Final   Special Requests BOTTLES DRAWN AEROBIC AND ANAEROBIC 4ML  Final   Culture NO GROWTH 5 DAYS  Final   Report Status 04/15/2016 FINAL  Final  Urine culture     Status: Abnormal   Collection Time: 05/05/16  7:11 PM  Result Value Ref Range Status   Specimen Description URINE, RANDOM  Final   Special Requests NONE  Final   Culture (A)  Final    >=100,000 COLONIES/mL ESCHERICHIA COLI Confirmed Extended Spectrum Beta-Lactamase Producer (ESBL)    Report Status 05/08/2016 FINAL  Final   Organism ID, Bacteria ESCHERICHIA COLI (A)  Final      Susceptibility   Escherichia coli - MIC*    AMPICILLIN >=32 RESISTANT Resistant     CEFAZOLIN >=64 RESISTANT Resistant     CEFTRIAXONE >=64 RESISTANT Resistant     CIPROFLOXACIN 0.5 SENSITIVE Sensitive     GENTAMICIN <=1 SENSITIVE Sensitive     IMIPENEM <=0.25 SENSITIVE Sensitive     NITROFURANTOIN <=16 SENSITIVE Sensitive     TRIMETH/SULFA <=20 SENSITIVE Sensitive     AMPICILLIN/SULBACTAM >=32 RESISTANT Resistant     PIP/TAZO >=128 RESISTANT Resistant     Extended ESBL POSITIVE Resistant     * >=100,000 COLONIES/mL ESCHERICHIA COLI    [x]  Treated with cephalexin, organism resistant to prescribed antimicrobial []  Patient discharged originally without antimicrobial agent and treatment is now indicated  New antibiotic prescription: cipro 250mg  PO q12h for 7 days  ED Provider: Susa Griffins. Diona Foley, PharmD, BCPS Clinical Pharmacist 05/09/2016, 9:23 AM Infectious Diseases Pharmacist

## 2016-05-09 NOTE — Telephone Encounter (Signed)
Post ED Visit - Positive Culture Follow-up: Successful Patient Follow-Up  Culture assessed and recommendations reviewed by: []  Elenor Quinones, Pharm.D. []  Heide Guile, Pharm.D., BCPS []  Parks Neptune, Pharm.D. []  Alycia Rossetti, Pharm.D., BCPS []  [place], Pharm.D., BCPS, AAHIVP []  Legrand Como, Pharm.D., BCPS, AAHIVP []  Milus Glazier, Pharm.D. []  Stephens November, Pharm.D. Sharilyn Sites Pharm D Positive urine culture  []  Patient discharged without antimicrobial prescription and treatment is now indicated [x]  Organism is resistant to prescribed ED discharge antimicrobial []  Patient with positive blood cultures  Changes discussed with ED provider: Margarita Mail [facility] New antibiotic prescription Cipro 250 mg q 12 hrs x 7 days Called to [employer] [phone]  Left on answering machine (closed)  Contacted patient, date 04/29/16, time 1030   March Steyer Tollie Pizza 05/09/2016, 10:28 AM

## 2016-05-10 ENCOUNTER — Other Ambulatory Visit: Payer: Self-pay | Admitting: Licensed Clinical Social Worker

## 2016-05-13 ENCOUNTER — Ambulatory Visit (INDEPENDENT_AMBULATORY_CARE_PROVIDER_SITE_OTHER): Payer: Medicare Other | Admitting: *Deleted

## 2016-05-13 DIAGNOSIS — I4821 Permanent atrial fibrillation: Secondary | ICD-10-CM

## 2016-05-13 DIAGNOSIS — Z5181 Encounter for therapeutic drug level monitoring: Secondary | ICD-10-CM

## 2016-05-13 DIAGNOSIS — I482 Chronic atrial fibrillation: Secondary | ICD-10-CM

## 2016-05-13 LAB — POCT INR: INR: 3.2

## 2016-05-14 ENCOUNTER — Other Ambulatory Visit: Payer: Self-pay | Admitting: Licensed Clinical Social Worker

## 2016-05-14 NOTE — Patient Outreach (Signed)
[facility]) Care Management  05/14/2016  Charlotte Castro December 04, 1932 [id number]   Assessment- CSW received notification from Haynes Kerns, [place] Management Assistant that patient was another no show for [employer] yesterday for her medical appointment. CSW completed call to patient and patient answered. Patient stated "I was going to have to miss my appointment because they were taking so long so I had to take my car even though it has problems." CSW explained that they informed her that they would be picking her up 5-10 minutes before because she lived so close to where her appointment was. CSW also informed patient that she will not be eligible to use transportation through [facility] due to no shows. However, patient states she has stable transportation now.  Plan-CSW will not open case. CSW will make no further outreaches to patient.  Eula Fried, BSW, MSW, [email or web address] Phone: [phone] Fax: [phone]

## 2016-05-27 ENCOUNTER — Ambulatory Visit (INDEPENDENT_AMBULATORY_CARE_PROVIDER_SITE_OTHER): Payer: Medicare Other | Admitting: Pharmacist

## 2016-05-27 ENCOUNTER — Encounter (INDEPENDENT_AMBULATORY_CARE_PROVIDER_SITE_OTHER): Payer: Self-pay

## 2016-05-27 DIAGNOSIS — I482 Chronic atrial fibrillation: Secondary | ICD-10-CM | POA: Diagnosis not present

## 2016-05-27 DIAGNOSIS — I4821 Permanent atrial fibrillation: Secondary | ICD-10-CM

## 2016-05-27 DIAGNOSIS — Z5181 Encounter for therapeutic drug level monitoring: Secondary | ICD-10-CM

## 2016-05-27 LAB — POCT INR: INR: 2.1

## 2016-07-09 ENCOUNTER — Ambulatory Visit (INDEPENDENT_AMBULATORY_CARE_PROVIDER_SITE_OTHER): Payer: Medicare Other | Admitting: *Deleted

## 2016-07-09 DIAGNOSIS — I482 Chronic atrial fibrillation: Secondary | ICD-10-CM

## 2016-07-09 DIAGNOSIS — Z5181 Encounter for therapeutic drug level monitoring: Secondary | ICD-10-CM | POA: Diagnosis not present

## 2016-07-09 DIAGNOSIS — I4821 Permanent atrial fibrillation: Secondary | ICD-10-CM

## 2016-07-09 LAB — POCT INR: INR: 1.6

## 2016-07-09 MED ORDER — WARFARIN SODIUM 5 MG PO TABS: ORAL_TABLET | ORAL | 1 refills | Status: DC

## 2016-07-21 ENCOUNTER — Ambulatory Visit (INDEPENDENT_AMBULATORY_CARE_PROVIDER_SITE_OTHER): Payer: Medicare Other | Admitting: *Deleted

## 2016-07-21 DIAGNOSIS — Z5181 Encounter for therapeutic drug level monitoring: Secondary | ICD-10-CM | POA: Diagnosis not present

## 2016-07-21 DIAGNOSIS — I482 Chronic atrial fibrillation: Secondary | ICD-10-CM

## 2016-07-21 DIAGNOSIS — I4821 Permanent atrial fibrillation: Secondary | ICD-10-CM

## 2016-07-21 LAB — POCT INR
INR: 1.8
INR: 1.8

## 2016-08-04 ENCOUNTER — Ambulatory Visit (INDEPENDENT_AMBULATORY_CARE_PROVIDER_SITE_OTHER): Payer: Medicare Other | Admitting: *Deleted

## 2016-08-04 DIAGNOSIS — I482 Chronic atrial fibrillation: Secondary | ICD-10-CM | POA: Diagnosis not present

## 2016-08-04 DIAGNOSIS — I4821 Permanent atrial fibrillation: Secondary | ICD-10-CM

## 2016-08-04 DIAGNOSIS — Z5181 Encounter for therapeutic drug level monitoring: Secondary | ICD-10-CM

## 2016-08-04 LAB — POCT INR: INR: 2.1

## 2016-08-25 ENCOUNTER — Ambulatory Visit (INDEPENDENT_AMBULATORY_CARE_PROVIDER_SITE_OTHER): Payer: Medicare Other | Admitting: *Deleted

## 2016-08-25 DIAGNOSIS — I4821 Permanent atrial fibrillation: Secondary | ICD-10-CM

## 2016-08-25 DIAGNOSIS — I482 Chronic atrial fibrillation: Secondary | ICD-10-CM | POA: Diagnosis not present

## 2016-08-25 DIAGNOSIS — Z5181 Encounter for therapeutic drug level monitoring: Secondary | ICD-10-CM

## 2016-08-25 LAB — POCT INR: INR: 2.1

## 2016-09-22 ENCOUNTER — Ambulatory Visit (INDEPENDENT_AMBULATORY_CARE_PROVIDER_SITE_OTHER): Payer: Medicare Other | Admitting: *Deleted

## 2016-09-22 DIAGNOSIS — Z5181 Encounter for therapeutic drug level monitoring: Secondary | ICD-10-CM

## 2016-09-22 DIAGNOSIS — I482 Chronic atrial fibrillation: Secondary | ICD-10-CM

## 2016-09-22 DIAGNOSIS — I4821 Permanent atrial fibrillation: Secondary | ICD-10-CM

## 2016-09-22 LAB — POCT INR: INR: 1.9

## 2016-10-03 NOTE — Progress Notes (Signed)
Patient ID: Charlotte Castro, female   DOB: Apr 17, 1933, 83 y.o.   MRN: [id number]     Cardiology Office Note   Date:  10/04/2016   ID:  Charlotte Castro, DOB 05-29-1933, MRN [id number]  PCP:  Antony Blackbird, MD    No chief complaint on file. f/u AFib   Wt Readings from Last 3 Encounters:  10/04/16 218 lb 12.8 oz (99.2 kg)  04/21/16 216 lb 10.1 oz (98.3 kg)  04/10/16 224 lb (101.6 kg)       History of Present Illness: Charlotte Castro is a 83 y.o. female  who has had several different types of tachyarrhythmia and bradycardia which have been difficult to manage over the past few years, since 2012-13; due to other medical issues. She has had syncope and was hospitalized in 2014. We have tried to evaluate her rhythm at home to see if she has significant pauses or heart rates below 40. She does not want to wear any type of heart monitor. Her daughter came in the past, annoyed with her mothers current health condition. Daughter stated she is self-employed so she has to take time off from work every time the mother is sick. She "wanted to get to the bottom of this." She was sent back to Dr. Rayann Heman. THere was no indication for pacer.  She had a grey coating on her eyelid-thought to be related to Amiodarone.  She is now off of amiodarone.  The patient was seeing neurology, but she stopped. She has a tremor and had been started on Parkinson's medicines. These aparently have been stopped. The patient disagreed with the diagnosis of Parkinsons. She was seen in the emergency room for syncope in 2014. Her main complaint is feeling weak and having joint pains. She does admit to significant anxiety which happens at random times. The daughter felt that anxiety is a big component of the patient's illness, when she was there at the last visit in 2/14.  THe patient was seen by ortho and rheumatology in the past. She took prednisone but this was tapered off. She still reports no energy and fatigue. She has had some  difficulty in regulating her INR. She is willing to try Eliquis but not Xarelto.  She heard bad things about Xarelto.  She had problems getting her Eliquis.  It apparently was not covered and would be very expensive.  SHe switched to warfarin.  No significant bleeding problems.  She had some mild hematuria in the past before warfarin which has resolved.   In the past, she had a large hiatal hernia and was referred to surgery, but she declined an operation. She is trying to stick to a soft diet.     She had a fall, getting out of the car.  No syncope.  No hitting her head. No recent falls.   Coumadin has been well regulated until recently when she needed a dose change.    She is still driving.  She only drives locally.  She is having issues with retaining her license.  She has not needed furosemide for the past few weeks.   Her son lives in [place].  He helps her get around.    In October 2017, she was hospitalized after sliding out of bed and having a concern for UTI.  She declined going to a rehab facility.   She has had a few UTIs.  Her biggest concern is keeping her drivers license.    Past Medical History:  Diagnosis  Date  . Atrial fibrillation ([place])   . Atrial flutter ([place])    typical appearing  . Atrial tachycardia ([place])    ablated 11/17/10  by JA  from the [facility] of the aorta  . Dizziness    chronic and of an unclear etiology  . Gastritis   . GERD (gastroesophageal reflux disease)   . HTN (hypertension)   . Hyperlipemia   . Hypothyroidism   . Obesity   . Osteoporosis   . Vitamin D deficiency     Past Surgical History:  Procedure Laterality Date  . ATRIAL ABLATION SURGERY  11/17/10   Atrial tachycardia arising from [facility] of the aorta ablated by JA     Current Outpatient Prescriptions  Medication Sig Dispense Refill  . acetaminophen (TYLENOL) 325 MG tablet Take 325 mg by mouth every 4 (four) hours as needed for headache (pain). Reported on 10/04/2015    .  Calcium Carbonate-Vitamin D (CALCIUM-VITAMIN D) 600-200 MG-UNIT CAPS Take 1 capsule by mouth daily. Reported on 10/04/2015    . Cholecalciferol (VITAMIN D3) 1000 UNITS CAPS Take 1,000 Units by mouth daily. Reported on 10/04/2015    . clonazePAM (KLONOPIN) 0.5 MG tablet Take 0.5-1 tablets (0.25-0.5 mg total) by mouth 2 (two) times daily. 10 tablet 0  . levothyroxine (SYNTHROID, LEVOTHROID) 88 MCG tablet Take 1 tablet (88 mcg total) by mouth daily before breakfast. 30 tablet 0  . metoprolol (LOPRESSOR) 50 MG tablet Take 1 tablet (50 mg total) by mouth 2 (two) times daily. 60 tablet 0  . OVER THE COUNTER MEDICATION Place 1 drop into both eyes daily as needed (dry eyes). Over the counter lubricating eye drop    . warfarin (COUMADIN) 5 MG tablet 5 mg by mouth daily except 1/2 tablet (2.5 mg) on Tuesday, Thursday and Sunday 30 tablet 1   No current facility-administered medications for this visit.     Allergies:   Iodinated diagnostic agents and Penicillins    Social History:  The patient  reports that she has never smoked. She has never used smokeless tobacco. She reports that she does not drink alcohol or use drugs.   Family History:  The patient's family history includes Heart attack in her father; Hypertension in her father.    ROS:  Please see the history of present illness.   Otherwise, review of systems are positive for weakness, fatigue and stress regarding her drivers license.   All other systems are reviewed and negative.    PHYSICAL EXAM: VS:  BP 138/82   Pulse 86   Ht 5\' 8"  (1.727 m)   Wt 218 lb 12.8 oz (99.2 kg)   SpO2 95%   BMI 33.27 kg/m  , BMI Body mass index is 33.27 kg/m. GEN: Well nourished, well developed, in no acute distress  HEENT: normal  Neck: no JVD, carotid bruits, or masses Cardiac: irregularly irregular; no murmurs, rubs, or gallops, tr bilateral edema  Respiratory:  clear to auscultation bilaterally, normal work of breathing GI: soft, nontender, nondistended,  + BS MS: no deformity or atrophy  Skin: warm and dry, no rash Neuro:  Strength and sensation are intact Psych: euthymic mood, full affect   Recent Labs: 04/10/2016: TSH 1.455 04/13/2016: Magnesium 1.9 05/05/2016: ALT 17; BUN 17; Creatinine, Ser 1.02; Hemoglobin 15.9; Platelets 215; Potassium 4.3; Sodium 140   Lipid Panel No results found for: CHOL, TRIG, HDL, CHOLHDL, VLDL, LDLCALC, LDLDIRECT   Other studies Reviewed: Additional studies/ records that were reviewed today with results demonstrating: Prior cath  showing no CAD.  Normal renal function.  ECG: Rate controlled AFib   ASSESSMENT AND PLAN:  1. AFib:  Seen in th eAFib clinic in 2016.  Has had other arrhythmias in the past.  She did not feel that they did anything significant and was somewhat dissatisfied with the inconvenience. No changes made. Rate cotrolled today.  No palpitations.  She will f/u here and use COumadin for stroke prevention. 2. Chronic fatigue sx. Going on for years.  No CAD by cath.  AFib controlled. I don't think this is cardiac. 3. HTN: BP controlled. Continue current medicines and lifestyle modifications.  4. Continue warfarin for stroke prevention. Doing well on warfarin.  Avoid falls. Use cane. 5. DMV form has been  filled out, presumably by Dr. Fara Olden.  She does not have CHF, pacer, AICD or CAD.  Only rate controlled AFib.  NYHA II.  Biggest limitations are from knee problems and tremor.  She will f/u with PMD.   Current medicines are reviewed at length with the patient today.  The patient concerns regarding her medicines were addressed.  The following changes have been made:    Labs/ tests ordered today include: INR to be checked today No orders of the defined types were placed in this encounter.   Recommend 150 minutes/week of aerobic exercise Low fat, low carb, high fiber diet recommended  Disposition:   FU in 6 months   Signed, Larae Grooms, MD  10/04/2016 9:51 AM    [facility] [place], [place], [place]  [address] Phone: [phone]; Fax: [phone]    Patient ID: SAMARIAH HOKENSON, female   DOB: 02/25/1933, 83 y.o.   MRN: [id number] Patient ID: LEINA BABE, female   DOB: 10-Dec-1932, 83 y.o.   MRN: [id number]     Cardiology Office Note   Date:  10/04/2016   ID:  CARLISS PORCARO, DOB Mar 02, 1933, MRN [id number]  PCP:  Antony Blackbird, MD    No chief complaint on file. f/u AFib   Wt Readings from Last 3 Encounters:  10/04/16 218 lb 12.8 oz (99.2 kg)  04/21/16 216 lb 10.1 oz (98.3 kg)  04/10/16 224 lb (101.6 kg)       History of Present Illness: DEAUNDRA DUPRIEST is a 83 y.o. female  who has had several different types of tachyarrhythmia and bradycardia which have been difficult to manage over the past few years, since 2012-13; due to other medical issues. She has had syncope and was hospitalized in 2014. We have tried to evaluate her rhythm at home to see if she has significant pauses or heart rates below 40. She does not want to wear any type of heart monitor. Her daughter came in the past, annoyed with her mothers current health condition. Daughter stated she is self-employed so she has to take time off from work every time the mother is sick. She "wanted to get to the bottom of this." She was sent back to Dr. Rayann Heman. THere was no indication for pacer. She is off of amiodarone. She had a grey coating on her eyelid-thought to be related to Amiodarone.  The patient was seeing neurology, but she stopped. She has a tremor and had been started on Parkinson's medicines. These aparently have been stopped. The patient disagreed with the diagnosis of Parkinsons. She was seen in the emergency room for syncope in 2014. Her main complaint is feeling weak and having joint pains. She does admit to significant anxiety  which happens at random times. The daughter felt that anxiety is a big component of the patient's illness, when she was here at  the last visit in 2/14.  THe patient was seen by ortho and rheumatology in the past. She took prednisone but this was tapered off. She still reports no energy and fatigue. She has had some difficulty in regulating her INR. She is willing to try Eliquis but not Xarelto.  She heard bad things about Xarelto.  Since the last visit, she reports having problems getting her Eliquis.  It apparently is not covered and would be very expensive.  SHe wants to know what other alternative.  No significant bleeding problems.  She had some mild hematuria which has apparently resolved.   She is having to have a form filled out to get her license renewed. She has a form with her today. She only drives locally. She denies any lightheadedness or passing out. No chest pain. No palpitations.    Past Medical History:  Diagnosis Date  . Atrial fibrillation ([place])   . Atrial flutter ([place])    typical appearing  . Atrial tachycardia ([place])    ablated 11/17/10  by JA  from the [facility] of the aorta  . Dizziness    chronic and of an unclear etiology  . Gastritis   . GERD (gastroesophageal reflux disease)   . HTN (hypertension)   . Hyperlipemia   . Hypothyroidism   . Obesity   . Osteoporosis   . Vitamin D deficiency     Past Surgical History:  Procedure Laterality Date  . ATRIAL ABLATION SURGERY  11/17/10   Atrial tachycardia arising from [facility] of the aorta ablated by JA     Current Outpatient Prescriptions  Medication Sig Dispense Refill  . acetaminophen (TYLENOL) 325 MG tablet Take 325 mg by mouth every 4 (four) hours as needed for headache (pain). Reported on 10/04/2015    . Calcium Carbonate-Vitamin D (CALCIUM-VITAMIN D) 600-200 MG-UNIT CAPS Take 1 capsule by mouth daily. Reported on 10/04/2015    . Cholecalciferol (VITAMIN D3) 1000 UNITS CAPS Take 1,000 Units by mouth daily. Reported on 10/04/2015    . clonazePAM (KLONOPIN) 0.5 MG tablet Take 0.5-1 tablets (0.25-0.5 mg total) by mouth 2 (two) times daily. 10  tablet 0  . levothyroxine (SYNTHROID, LEVOTHROID) 88 MCG tablet Take 1 tablet (88 mcg total) by mouth daily before breakfast. 30 tablet 0  . metoprolol (LOPRESSOR) 50 MG tablet Take 1 tablet (50 mg total) by mouth 2 (two) times daily. 60 tablet 0  . OVER THE COUNTER MEDICATION Place 1 drop into both eyes daily as needed (dry eyes). Over the counter lubricating eye drop    . warfarin (COUMADIN) 5 MG tablet 5 mg by mouth daily except 1/2 tablet (2.5 mg) on Tuesday, Thursday and Sunday 30 tablet 1   No current facility-administered medications for this visit.     Allergies:   Iodinated diagnostic agents and Penicillins    Social History:  The patient  reports that she has never smoked. She has never used smokeless tobacco. She reports that she does not drink alcohol or use drugs.   Family History:  The patient's family history includes Heart attack in her father; Hypertension in her father.    ROS:  Please see the history of present illness.   Otherwise, review of systems are positive for weakness, fatigue.   All other systems are reviewed and negative.    PHYSICAL EXAM: VS:  BP 138/82  Pulse 86   Ht 5\' 8"  (1.727 m)   Wt 218 lb 12.8 oz (99.2 kg)   SpO2 95%   BMI 33.27 kg/m  , BMI Body mass index is 33.27 kg/m. GEN: Well nourished, well developed, in no acute distress  HEENT: normal  Neck: no JVD, carotid bruits, or masses Cardiac: irregularly irregular; no murmurs, rubs, or gallops, tr bilateral edema  Respiratory:  clear to auscultation bilaterally, normal work of breathing GI: soft, nontender, nondistended, + BS MS: no deformity or atrophy  Skin: warm and dry, no rash Neuro:  Strength and sensation are intact Psych: euthymic mood, full affect   Recent Labs: 04/10/2016: TSH 1.455 04/13/2016: Magnesium 1.9 05/05/2016: ALT 17; BUN 17; Creatinine, Ser 1.02; Hemoglobin 15.9; Platelets 215; Potassium 4.3; Sodium 140   Lipid Panel No results found for: CHOL, TRIG, HDL,  CHOLHDL, VLDL, LDLCALC, LDLDIRECT   Other studies Reviewed: Additional studies/ records that were reviewed today with results demonstrating: Prior cath showing no CAD.  Normal renal function.  ECG: Rate controlled AFib   ASSESSMENT AND PLAN:  1. AFib:  Seen in th eAFib clinic in 2016.  Has had other arrhythmias in the past.  She did not feel that they did anything significant and was somewhat dissatisfied with the inconvenience. No changes made. Rate cotrolled today.  No palpitations. 2. Chronic fatigue sx. Going on for years.  No CAD by cath.  AFib controlled. I don't think this is cardiac. Encouraged her to try nutritional supplement like Ensure. 3. HTN: BP controlled. Continue current medicines and lifestyle modifications. Readings elevated today, but usually controlled. 4. Changed COumadin to Eliquis.    Cost is an issue.  Copay card no longer accepted.  SHe will see the PharmD today.  They will coordinate her switch back to Coumadin and more visits for COumadin checks. 5.  She does not have CHF, pacer, AICD or CAD.  Only rate controlled AFib.  NYHA II.  Biggest limitations are from knee problems and tremor.  She will f/u with PMD.   Current medicines are reviewed at length with the patient today.  The patient concerns regarding her medicines were addressed.  The following changes have been made:  Stop COumadin. Start Eliquis  Labs/ tests ordered today include: INR; Cr 1.0 in 6/16. No orders of the defined types were placed in this encounter.   Recommend 150 minutes/week of aerobic exercise Low fat, low carb, high fiber diet recommended  Disposition:   FU in 6 months   Signed, Larae Grooms, MD  10/04/2016 9:51 AM    [facility] [place], [place], [place]  [address] Phone: [phone]; Fax: [phone]

## 2016-10-04 ENCOUNTER — Encounter: Payer: Self-pay | Admitting: Interventional Cardiology

## 2016-10-04 ENCOUNTER — Ambulatory Visit (INDEPENDENT_AMBULATORY_CARE_PROVIDER_SITE_OTHER): Payer: Medicare Other | Admitting: *Deleted

## 2016-10-04 ENCOUNTER — Encounter (INDEPENDENT_AMBULATORY_CARE_PROVIDER_SITE_OTHER): Payer: Self-pay

## 2016-10-04 ENCOUNTER — Ambulatory Visit (INDEPENDENT_AMBULATORY_CARE_PROVIDER_SITE_OTHER): Payer: Medicare Other | Admitting: Interventional Cardiology

## 2016-10-04 VITALS — BP 138/82 | HR 86 | Ht 68.0 in | Wt 218.8 lb

## 2016-10-04 DIAGNOSIS — I482 Chronic atrial fibrillation: Secondary | ICD-10-CM

## 2016-10-04 DIAGNOSIS — I4821 Permanent atrial fibrillation: Secondary | ICD-10-CM

## 2016-10-04 DIAGNOSIS — Z7901 Long term (current) use of anticoagulants: Secondary | ICD-10-CM

## 2016-10-04 DIAGNOSIS — R5383 Other fatigue: Secondary | ICD-10-CM | POA: Diagnosis not present

## 2016-10-04 DIAGNOSIS — Z5181 Encounter for therapeutic drug level monitoring: Secondary | ICD-10-CM | POA: Diagnosis not present

## 2016-10-04 DIAGNOSIS — I10 Essential (primary) hypertension: Secondary | ICD-10-CM

## 2016-10-04 LAB — POCT INR: INR: 2.3

## 2016-10-04 NOTE — Patient Instructions (Signed)
Your physician recommends that you continue on your current medications as directed. Please refer to the Current Medication list given to you today.   Your physician wants you to follow-up in: 6 MONTHS WITH DR VARANASI You will receive a reminder letter in the mail two months in advance. If you don't receive a letter, please call our office to schedule the follow-up appointment.  

## 2016-10-07 ENCOUNTER — Other Ambulatory Visit: Payer: Self-pay | Admitting: Interventional Cardiology

## 2016-10-07 DIAGNOSIS — I4821 Permanent atrial fibrillation: Secondary | ICD-10-CM

## 2016-10-20 ENCOUNTER — Ambulatory Visit
Admission: RE | Admit: 2016-10-20 | Discharge: 2016-10-20 | Disposition: A | Discharge status: Home / Self Care | Payer: Medicare Other | Source: Ambulatory Visit | Attending: Internal Medicine | Admitting: Internal Medicine

## 2016-10-20 ENCOUNTER — Other Ambulatory Visit: Payer: Self-pay | Admitting: Internal Medicine

## 2016-10-20 DIAGNOSIS — R0609 Other forms of dyspnea: Principal | ICD-10-CM

## 2016-10-20 IMAGING — DX DG CHEST 2V
2 series · 2 of 2 positions shown · non-contrast
Comparison: Chest x-ray of [DATE].

CLINICAL DATA: Several weeks of dyspnea on exertion. History of
cardiac ablation procedure for atrial tachycardia on [DATE].
History of chronic CHF.

EXAM:
CHEST  2 VIEW

[dg chest 2 view (1 of 2)]
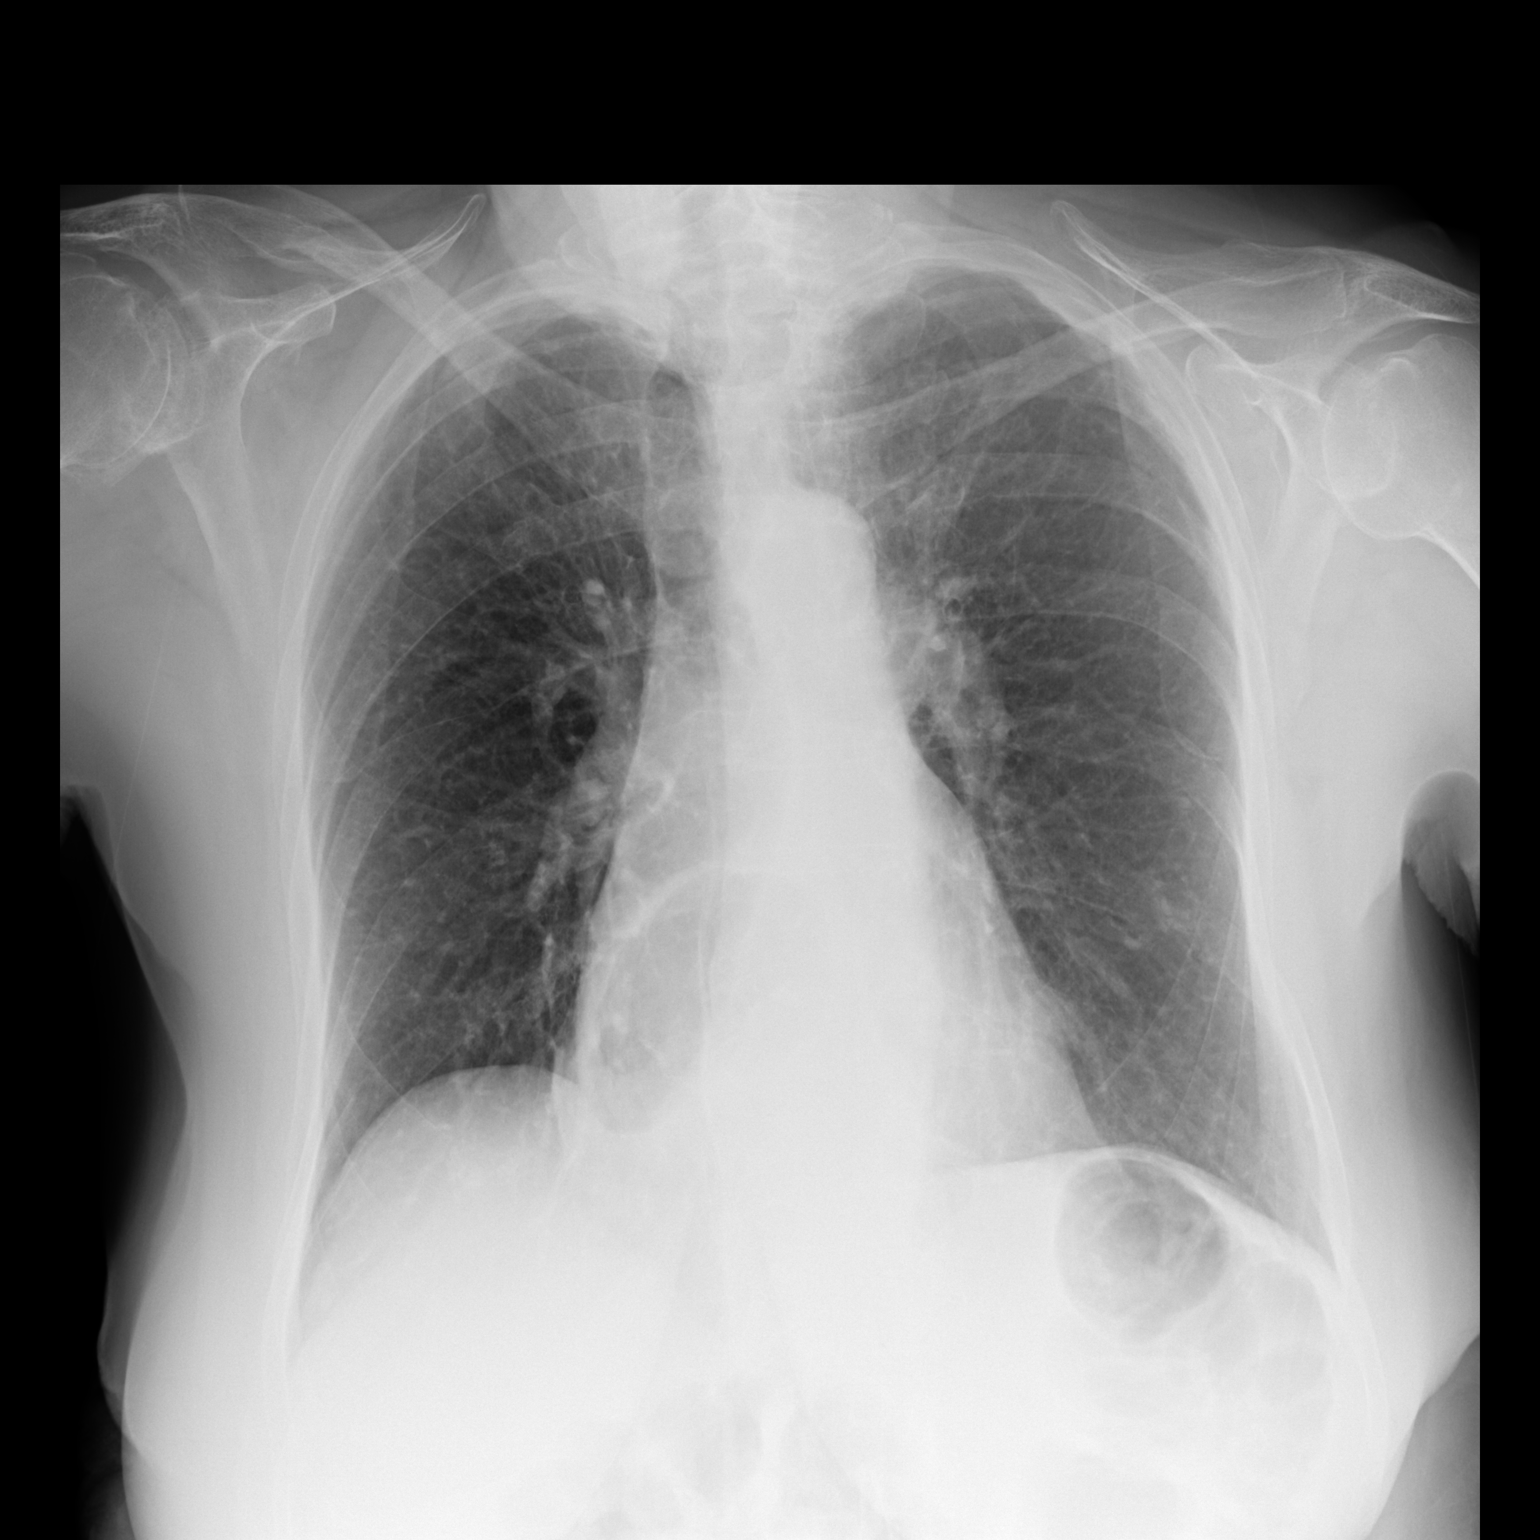

[dg chest 2 view (2 of 2)]
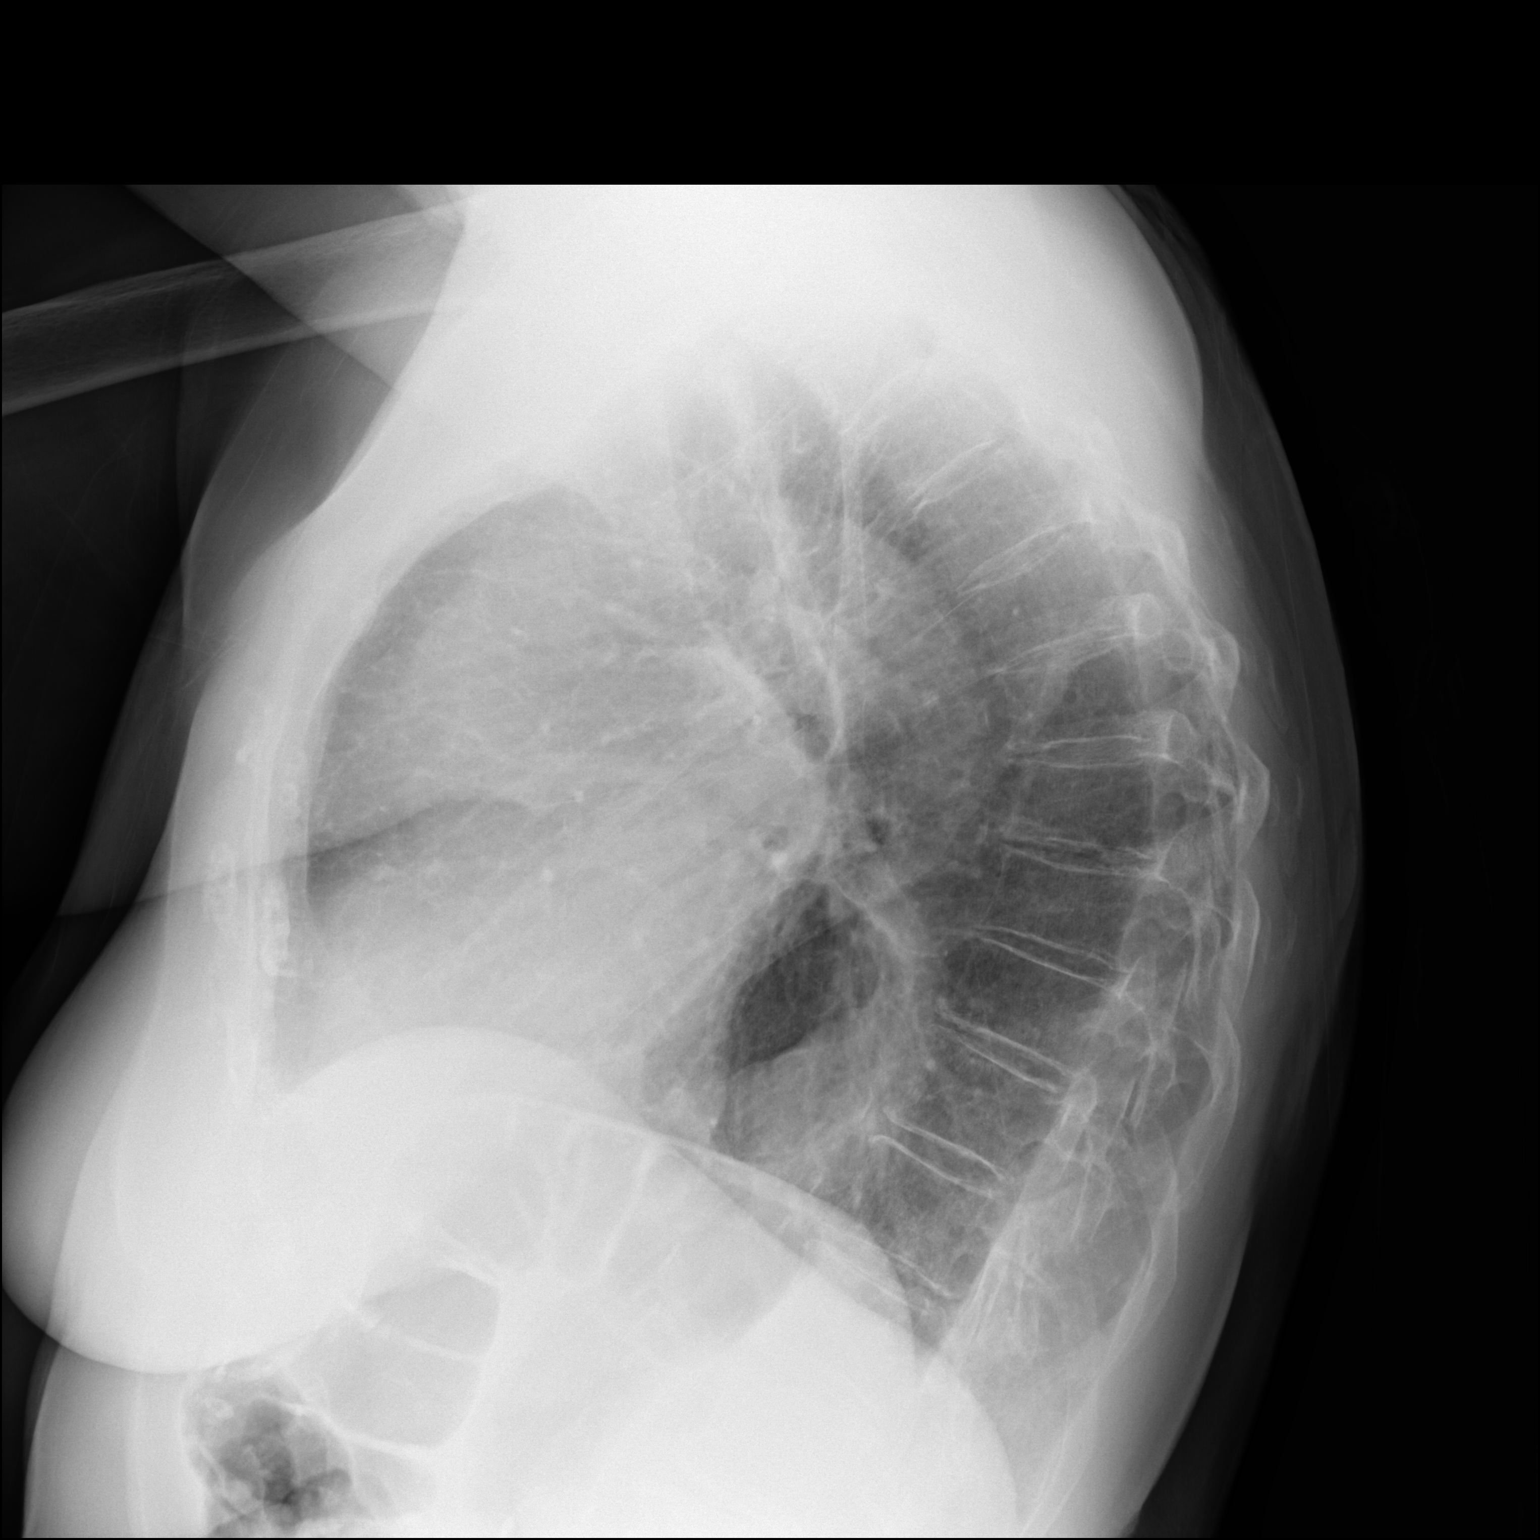

[2 of 2 positions shown; findings below may reference images not displayed]

FINDINGS: The lungs are well-expanded and clear there is no pleural effusion
or pneumothorax. There is a large hiatal hernia. The heart and
pulmonary vascularity are normal. The mediastinum is normal in
width. There is deviation of the trachea toward the right which is
stable. There is calcification in the wall of the aortic arch. The
bony thorax exhibits no acute abnormality.
IMPRESSION: There is no acute cardiopulmonary abnormality. There are chronic
bronchitic changes which are stable.

Thoracic aortic atherosclerosis.

Large hiatal hernia which not a new finding.

## 2016-10-25 ENCOUNTER — Ambulatory Visit (INDEPENDENT_AMBULATORY_CARE_PROVIDER_SITE_OTHER): Payer: Medicare Other | Admitting: *Deleted

## 2016-10-25 DIAGNOSIS — Z5181 Encounter for therapeutic drug level monitoring: Secondary | ICD-10-CM | POA: Diagnosis not present

## 2016-10-25 DIAGNOSIS — I482 Chronic atrial fibrillation: Secondary | ICD-10-CM | POA: Diagnosis not present

## 2016-10-25 DIAGNOSIS — I4821 Permanent atrial fibrillation: Secondary | ICD-10-CM

## 2016-10-25 LAB — POCT INR: INR: 2.1

## 2016-11-22 ENCOUNTER — Ambulatory Visit (INDEPENDENT_AMBULATORY_CARE_PROVIDER_SITE_OTHER): Payer: Medicare Other | Admitting: *Deleted

## 2016-11-22 DIAGNOSIS — Z5181 Encounter for therapeutic drug level monitoring: Secondary | ICD-10-CM | POA: Diagnosis not present

## 2016-11-22 DIAGNOSIS — I482 Chronic atrial fibrillation: Secondary | ICD-10-CM

## 2016-11-22 DIAGNOSIS — I4821 Permanent atrial fibrillation: Secondary | ICD-10-CM

## 2016-11-22 LAB — POCT INR: INR: 2.6

## 2016-12-20 ENCOUNTER — Ambulatory Visit (INDEPENDENT_AMBULATORY_CARE_PROVIDER_SITE_OTHER): Payer: Medicare Other | Admitting: *Deleted

## 2016-12-20 DIAGNOSIS — Z5181 Encounter for therapeutic drug level monitoring: Secondary | ICD-10-CM

## 2016-12-20 DIAGNOSIS — I482 Chronic atrial fibrillation: Secondary | ICD-10-CM | POA: Diagnosis not present

## 2016-12-20 DIAGNOSIS — I4821 Permanent atrial fibrillation: Secondary | ICD-10-CM

## 2016-12-20 LAB — POCT INR: INR: 2.3

## 2017-01-05 ENCOUNTER — Ambulatory Visit (INDEPENDENT_AMBULATORY_CARE_PROVIDER_SITE_OTHER): Payer: Medicare Other

## 2017-01-05 ENCOUNTER — Ambulatory Visit (INDEPENDENT_AMBULATORY_CARE_PROVIDER_SITE_OTHER): Payer: Medicare Other | Admitting: Podiatry

## 2017-01-05 ENCOUNTER — Encounter: Payer: Self-pay | Admitting: Podiatry

## 2017-01-05 VITALS — BP 131/90 | HR 91 | Temp 96.1°F | Resp 18

## 2017-01-05 DIAGNOSIS — G609 Hereditary and idiopathic neuropathy, unspecified: Secondary | ICD-10-CM

## 2017-01-05 DIAGNOSIS — M2042 Other hammer toe(s) (acquired), left foot: Secondary | ICD-10-CM

## 2017-01-05 DIAGNOSIS — M79672 Pain in left foot: Secondary | ICD-10-CM

## 2017-01-05 NOTE — Patient Instructions (Signed)
Today we discussed the possible cause of your extreme pain in her left foot. Most likely is associated with a nerve problem of undetermined cause. At this time I'm ointment to recommend a topical cream, peripheral neuropathy cream from a compounding pharmacy to apply to the areas on the top of your left foot and toes 3-4 times daily. If the symptoms do not gradually improve or stop recommend follow-up with a neurologist or a pain management specialist

## 2017-01-05 NOTE — Progress Notes (Signed)
Subjective:    Patient ID: Charlotte Castro, female    DOB: 06/08/1933, 83 y.o.   MRN: [id number]  HPI This patient presents today complaining of extreme pain, stinging in the dorsal aspect of the left foot and toes 3-5. The pain is activated with motion, weightbearing core light touch. The symptoms reduce somewhat with removal shoes and nonweightbearing. Patient has tried soaking in extremity warm water as a variety of topical medications without any proof of symptoms. She said the pain in this left foot limits her ability to stand and walk. 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 Patient denies diabetes Patient only describes occasional mild discomfort in the right foot  The patient's son is present in the treatment room today  Review of Systems  Constitutional: Positive for activity change, chills, fatigue and unexpected weight change.  HENT: Positive for sinus pressure.   Respiratory: Positive for cough and shortness of breath.   Cardiovascular: Positive for leg swelling.       Calf pain with walking   Gastrointestinal: Positive for nausea.  Genitourinary: Positive for frequency.  Musculoskeletal: Positive for gait problem.       Joint and muscle pain  Skin:       It is the last three toes on my left foot    Neurological: Positive for dizziness, tremors and weakness.  All other systems reviewed and are negative.      Objective:   Physical Exam  Orientated 3  Vascular: No calf edema or calf tenderness bilaterally DP pulses 1/4 bilaterally PT pulses 2/4 bilaterally Capillary reflex within normal limits bilaterally Varicosities ankles and feet bilaterally  Neurological: Sensation to 10 g monofilament wire intact 9/9 right 8/9 left Vibratory sensation nonreactive bilaterally Ankle reflex reactive  bilaterally Extreme hypersensitivity and painful reaction to light touch on the dorsal aspect left foot and toes 2-4 left  Dermatological: No open skin lesions bilaterally Hyperpigmented skin lower legs and dorsal feet bilaterally Well-healed Surgical scar dorsal aspect of left hallux and second left toe Eschar distal third left toe Texture and turgor similar bilaterally Absent hair growth bilaterally  Musculoskeletal: HAV bilaterally Hammertoe 2-4 bilaterally Manual motor testing dorsi flexion 4/5 bilaterally Plantar flexion 4/5 bilaterally  Patient has painful gait   Wheelchair patient for x-ray exam today X-ray examination non-weightbearing left foot dated 01/05/2017  Intact bony structures without fracture and/or dislocation Decreased bone density noted throughout all views Decreased joint space at Lisfranc's joint Decreased joint space first MPJ HAV deformity Hammertoe 2-5 Inferior calcaneal spur Evidence of well-healed fractures second metatarsal in satisfactory alignment  Radiographic impression: No acute bony abnormality 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 I informed him that was likely source or pain is nerve related, neuropathy with pain. Form that I could not give him a specific diagnosis as to the cause of this extreme pain. I recommended topical peripheral neuropathy cream to avoid GI upset with oral medication  Rx peripheral neuropathy cream:  bupivacaine, doxepin, gabapentin, pentoxifyline, topiramate Dispensed 120 g Sig apply 1-2  grams to the affected area 3-4 times daily  Advised patient that if  symptoms are not improving or reducing the next 30 days consult with internist, neurologist, pain management specialist with referral from primary care physician

## 2017-01-31 ENCOUNTER — Ambulatory Visit (INDEPENDENT_AMBULATORY_CARE_PROVIDER_SITE_OTHER): Payer: Medicare Other | Admitting: Pharmacist

## 2017-01-31 DIAGNOSIS — I482 Chronic atrial fibrillation: Secondary | ICD-10-CM

## 2017-01-31 DIAGNOSIS — Z5181 Encounter for therapeutic drug level monitoring: Secondary | ICD-10-CM

## 2017-01-31 DIAGNOSIS — I4821 Permanent atrial fibrillation: Secondary | ICD-10-CM

## 2017-01-31 LAB — POCT INR: INR: 1.8

## 2017-02-07 ENCOUNTER — Encounter: Payer: Self-pay | Admitting: Neurology

## 2017-03-04 ENCOUNTER — Ambulatory Visit (INDEPENDENT_AMBULATORY_CARE_PROVIDER_SITE_OTHER): Payer: Medicare Other | Admitting: *Deleted

## 2017-03-04 DIAGNOSIS — I482 Chronic atrial fibrillation: Secondary | ICD-10-CM

## 2017-03-04 DIAGNOSIS — I4821 Permanent atrial fibrillation: Secondary | ICD-10-CM

## 2017-03-04 DIAGNOSIS — Z5181 Encounter for therapeutic drug level monitoring: Secondary | ICD-10-CM

## 2017-03-04 LAB — POCT INR: INR: 1.7

## 2017-03-04 NOTE — Patient Instructions (Signed)
Please let the Dentist know you are on Coumadin.  If they recommend you holding Coumadin, have then fax a clearance sheet to our office at [phone] or call [phone]

## 2017-03-09 ENCOUNTER — Other Ambulatory Visit: Payer: Self-pay | Admitting: Interventional Cardiology

## 2017-03-09 DIAGNOSIS — I4821 Permanent atrial fibrillation: Secondary | ICD-10-CM

## 2017-03-18 ENCOUNTER — Ambulatory Visit (INDEPENDENT_AMBULATORY_CARE_PROVIDER_SITE_OTHER): Payer: Medicare Other | Admitting: *Deleted

## 2017-03-18 DIAGNOSIS — Z5181 Encounter for therapeutic drug level monitoring: Secondary | ICD-10-CM

## 2017-03-18 DIAGNOSIS — I482 Chronic atrial fibrillation: Secondary | ICD-10-CM | POA: Diagnosis not present

## 2017-03-18 DIAGNOSIS — I4821 Permanent atrial fibrillation: Secondary | ICD-10-CM

## 2017-03-18 LAB — POCT INR: INR: 2.5

## 2017-04-05 ENCOUNTER — Emergency Department (HOSPITAL_COMMUNITY): Payer: Medicare Other

## 2017-04-05 ENCOUNTER — Encounter (HOSPITAL_COMMUNITY): Payer: Self-pay

## 2017-04-05 ENCOUNTER — Inpatient Hospital Stay (HOSPITAL_COMMUNITY)
Admission: EM | Admit: 2017-04-05 | Discharge: 2017-04-12 | DRG: 417 | Disposition: A | Discharge status: Skilled Nursing Facility | Payer: Medicare Other | Attending: Internal Medicine | Admitting: Internal Medicine

## 2017-04-05 DIAGNOSIS — Z79890 Hormone replacement therapy (postmenopausal): Secondary | ICD-10-CM

## 2017-04-05 DIAGNOSIS — R1013 Epigastric pain: Secondary | ICD-10-CM | POA: Diagnosis not present

## 2017-04-05 DIAGNOSIS — Z7901 Long term (current) use of anticoagulants: Secondary | ICD-10-CM

## 2017-04-05 DIAGNOSIS — F419 Anxiety disorder, unspecified: Secondary | ICD-10-CM | POA: Diagnosis present

## 2017-04-05 DIAGNOSIS — E876 Hypokalemia: Secondary | ICD-10-CM | POA: Diagnosis not present

## 2017-04-05 DIAGNOSIS — Z91041 Radiographic dye allergy status: Secondary | ICD-10-CM

## 2017-04-05 DIAGNOSIS — R000 Tachycardia, unspecified: Secondary | ICD-10-CM | POA: Diagnosis present

## 2017-04-05 DIAGNOSIS — Z880 Allergy status to penicillin: Secondary | ICD-10-CM | POA: Diagnosis not present

## 2017-04-05 DIAGNOSIS — I4821 Permanent atrial fibrillation: Secondary | ICD-10-CM | POA: Diagnosis present

## 2017-04-05 DIAGNOSIS — Z6834 Body mass index (BMI) 34.0-34.9, adult: Secondary | ICD-10-CM

## 2017-04-05 DIAGNOSIS — R51 Headache: Secondary | ICD-10-CM | POA: Diagnosis present

## 2017-04-05 DIAGNOSIS — I482 Chronic atrial fibrillation: Secondary | ICD-10-CM | POA: Diagnosis present

## 2017-04-05 DIAGNOSIS — E669 Obesity, unspecified: Secondary | ICD-10-CM | POA: Diagnosis present

## 2017-04-05 DIAGNOSIS — M1990 Unspecified osteoarthritis, unspecified site: Secondary | ICD-10-CM | POA: Diagnosis present

## 2017-04-05 DIAGNOSIS — K8043 Calculus of bile duct with acute cholecystitis with obstruction: Secondary | ICD-10-CM

## 2017-04-05 DIAGNOSIS — E785 Hyperlipidemia, unspecified: Secondary | ICD-10-CM | POA: Diagnosis present

## 2017-04-05 DIAGNOSIS — K219 Gastro-esophageal reflux disease without esophagitis: Secondary | ICD-10-CM | POA: Diagnosis present

## 2017-04-05 DIAGNOSIS — I110 Hypertensive heart disease with heart failure: Secondary | ICD-10-CM | POA: Diagnosis present

## 2017-04-05 DIAGNOSIS — I481 Persistent atrial fibrillation: Secondary | ICD-10-CM | POA: Diagnosis present

## 2017-04-05 DIAGNOSIS — G92 Toxic encephalopathy: Secondary | ICD-10-CM | POA: Diagnosis not present

## 2017-04-05 DIAGNOSIS — Z79899 Other long term (current) drug therapy: Secondary | ICD-10-CM | POA: Diagnosis not present

## 2017-04-05 DIAGNOSIS — M25562 Pain in left knee: Secondary | ICD-10-CM | POA: Diagnosis present

## 2017-04-05 DIAGNOSIS — K449 Diaphragmatic hernia without obstruction or gangrene: Secondary | ICD-10-CM | POA: Diagnosis present

## 2017-04-05 DIAGNOSIS — Z8249 Family history of ischemic heart disease and other diseases of the circulatory system: Secondary | ICD-10-CM | POA: Diagnosis not present

## 2017-04-05 DIAGNOSIS — M810 Age-related osteoporosis without current pathological fracture: Secondary | ICD-10-CM | POA: Diagnosis present

## 2017-04-05 DIAGNOSIS — I10 Essential (primary) hypertension: Secondary | ICD-10-CM | POA: Diagnosis not present

## 2017-04-05 DIAGNOSIS — I5032 Chronic diastolic (congestive) heart failure: Secondary | ICD-10-CM | POA: Diagnosis present

## 2017-04-05 DIAGNOSIS — K8051 Calculus of bile duct without cholangitis or cholecystitis with obstruction: Secondary | ICD-10-CM | POA: Diagnosis present

## 2017-04-05 DIAGNOSIS — K859 Acute pancreatitis without necrosis or infection, unspecified: Secondary | ICD-10-CM

## 2017-04-05 DIAGNOSIS — K851 Biliary acute pancreatitis without necrosis or infection: Principal | ICD-10-CM | POA: Diagnosis present

## 2017-04-05 DIAGNOSIS — R9431 Abnormal electrocardiogram [ECG] [EKG]: Secondary | ICD-10-CM | POA: Diagnosis present

## 2017-04-05 DIAGNOSIS — K8065 Calculus of gallbladder and bile duct with chronic cholecystitis with obstruction: Secondary | ICD-10-CM | POA: Diagnosis present

## 2017-04-05 DIAGNOSIS — E039 Hypothyroidism, unspecified: Secondary | ICD-10-CM | POA: Diagnosis present

## 2017-04-05 HISTORY — DX: Other chronic pain: G89.29

## 2017-04-05 HISTORY — DX: Headache: R51

## 2017-04-05 HISTORY — DX: Anxiety disorder, unspecified: F41.9

## 2017-04-05 HISTORY — DX: Low back pain: M54.5

## 2017-04-05 HISTORY — DX: Unspecified osteoarthritis, unspecified site: M19.90

## 2017-04-05 HISTORY — DX: Headache, unspecified: R51.9

## 2017-04-05 HISTORY — DX: Personal history of other medical treatment: Z92.89

## 2017-04-05 HISTORY — DX: Low back pain, unspecified: M54.50

## 2017-04-05 LAB — CBC
HEMATOCRIT: 44.4 % (ref 36.0–46.0)
Hemoglobin: 15.5 g/dL — ABNORMAL HIGH (ref 12.0–15.0)
MCH: 32.4 pg (ref 26.0–34.0)
MCHC: 34.9 g/dL (ref 30.0–36.0)
MCV: 92.9 fL (ref 78.0–100.0)
PLATELETS: 180 10*3/uL (ref 150–400)
RBC: 4.78 MIL/uL (ref 3.87–5.11)
RDW: 13.6 % (ref 11.5–15.5)
WBC: 15.1 10*3/uL — ABNORMAL HIGH (ref 4.0–10.5)

## 2017-04-05 LAB — COMPREHENSIVE METABOLIC PANEL
ALBUMIN: 4.1 g/dL (ref 3.5–5.0)
ALT: 140 U/L — AB (ref 14–54)
AST: 308 U/L — AB (ref 15–41)
Alkaline Phosphatase: 98 U/L (ref 38–126)
Anion gap: 11 (ref 5–15)
BUN: 19 mg/dL (ref 6–20)
CHLORIDE: 106 mmol/L (ref 101–111)
CO2: 23 mmol/L (ref 22–32)
CREATININE: 0.98 mg/dL (ref 0.44–1.00)
Calcium: 10 mg/dL (ref 8.9–10.3)
GFR calc Af Amer: >60 mL/min (ref >60)
GFR calc non Af Amer: 52 mL/min — ABNORMAL LOW (ref >60)
Glucose, Bld: 133 mg/dL — ABNORMAL HIGH (ref 65–99)
POTASSIUM: 4.5 mmol/L (ref 3.5–5.1)
SODIUM: 140 mmol/L (ref 135–145)
Total Bilirubin: 2.7 mg/dL — ABNORMAL HIGH (ref 0.3–1.2)
Total Protein: 7 g/dL (ref 6.5–8.1)

## 2017-04-05 LAB — I-STAT CG4 LACTIC ACID, ED
LACTIC ACID, VENOUS: 3.18 mmol/L — AB (ref 0.5–1.9)
Lactic Acid, Venous: 2.51 mmol/L (ref 0.5–1.9)

## 2017-04-05 LAB — URINALYSIS, ROUTINE W REFLEX MICROSCOPIC
Bilirubin Urine: NEGATIVE
GLUCOSE, UA: NEGATIVE mg/dL
Hgb urine dipstick: NEGATIVE
Ketones, ur: NEGATIVE mg/dL
LEUKOCYTES UA: NEGATIVE
Nitrite: POSITIVE — AB
PROTEIN: NEGATIVE mg/dL
Specific Gravity, Urine: 1.015 (ref 1.005–1.030)
pH: 7 (ref 5.0–8.0)

## 2017-04-05 LAB — PROTIME-INR
INR: 2.07
PROTHROMBIN TIME: 23.2 s — AB (ref 11.4–15.2)

## 2017-04-05 LAB — LACTIC ACID, PLASMA
Lactic Acid, Venous: 1.3 mmol/L (ref 0.5–1.9)
Lactic Acid, Venous: 0.9 mmol/L (ref 0.5–1.9)

## 2017-04-05 LAB — LIPASE, BLOOD: Lipase: 3514 U/L — ABNORMAL HIGH (ref 11–51)

## 2017-04-05 IMAGING — CT CT ABD-PELV W/O CM
2 of 4 series · 16 of 46 positions shown, 18 images · non-contrast
Comparison: [DATE]

CLINICAL DATA: Unspecified abdominal pain

EXAM:
CT ABDOMEN AND PELVIS WITHOUT CONTRAST
TECHNIQUE: Multidetector CT imaging of the abdomen and pelvis was performed
following the standard protocol without IV contrast.

[Series 3: a/p w/o 5mm · axial · non-contrast · 0.96mm/px · z∈[+750,+1255]mm · 13 of 111 slices shown, 15 images]
[im 5/111  soft-tissue]
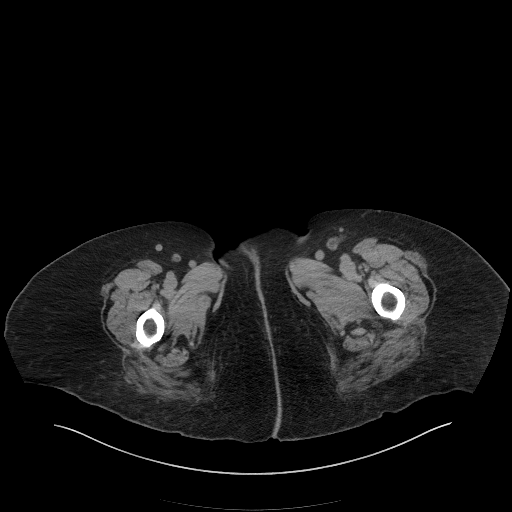
[im 5/111  bone]
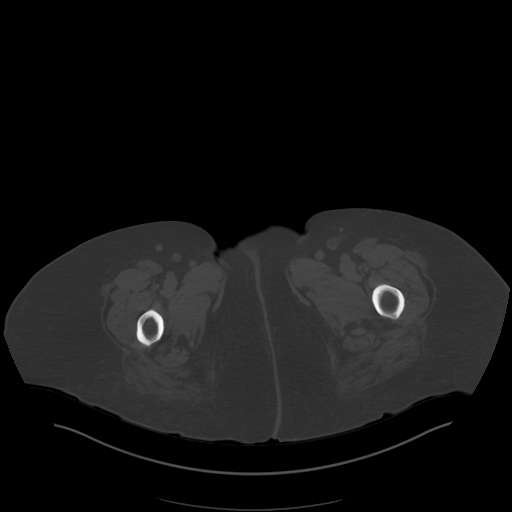
[im 14/111  soft-tissue]
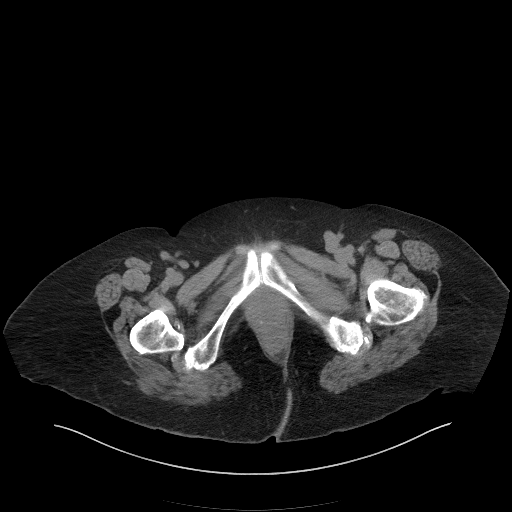
[im 23/111  soft-tissue]
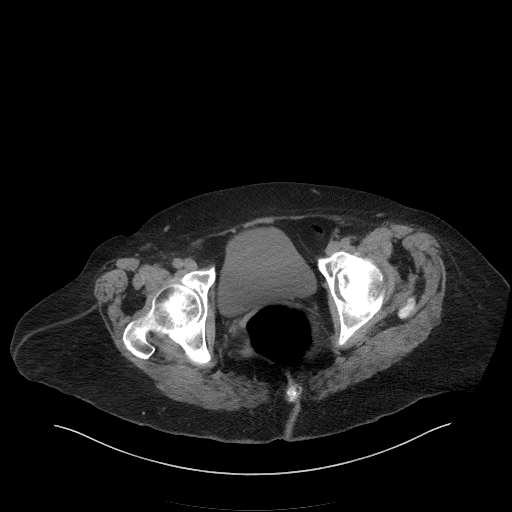
[im 33/111  soft-tissue]
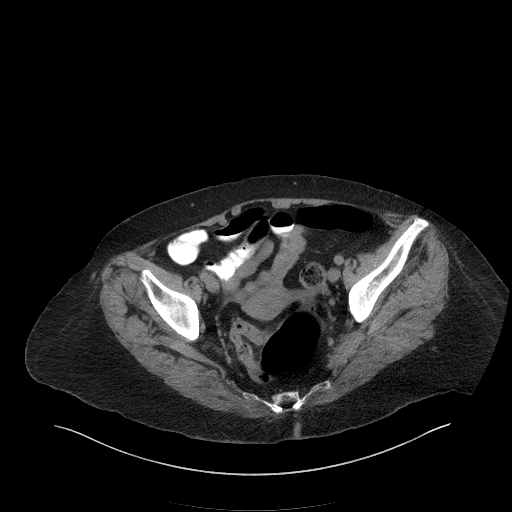
[im 37/111  soft-tissue]
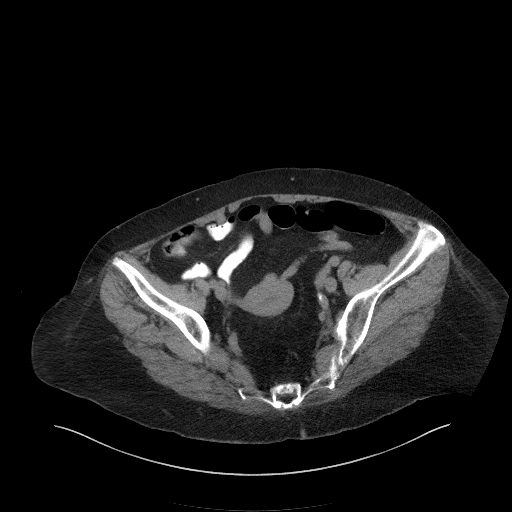
[im 46/111  soft-tissue]
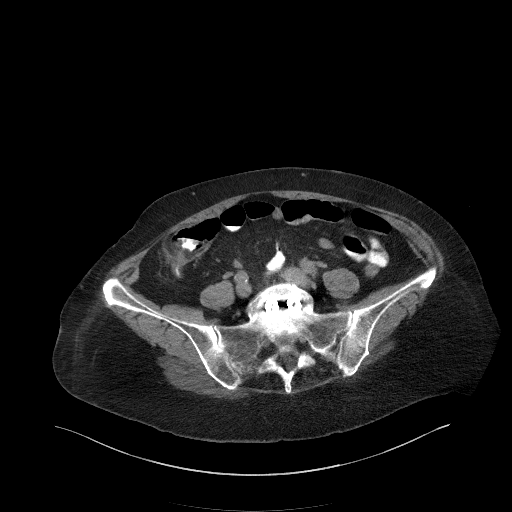
[im 56/111  soft-tissue]
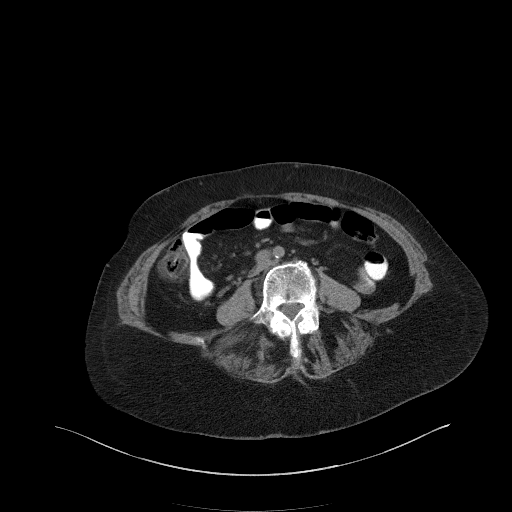
[im 65/111  soft-tissue]
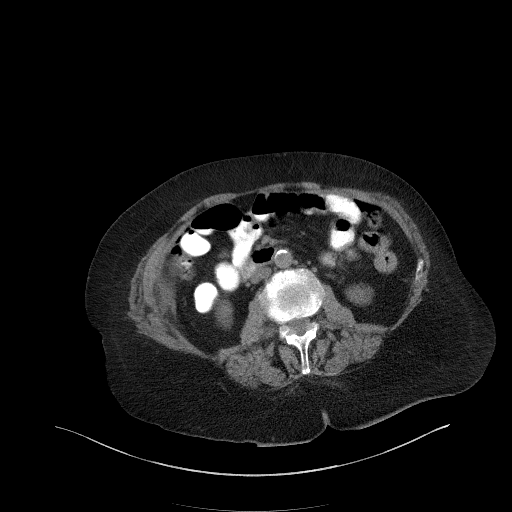
[im 74/111  soft-tissue]
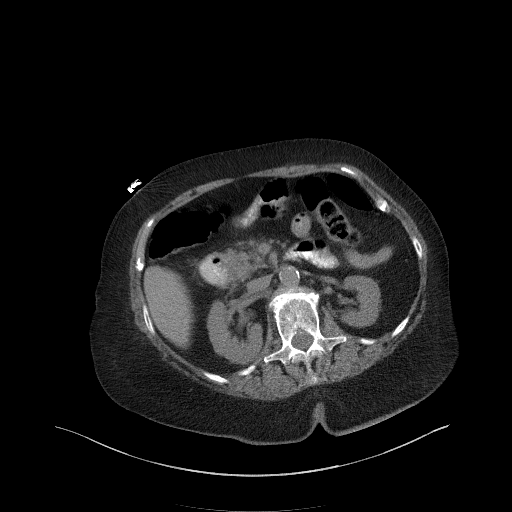
[im 74/111  bone]
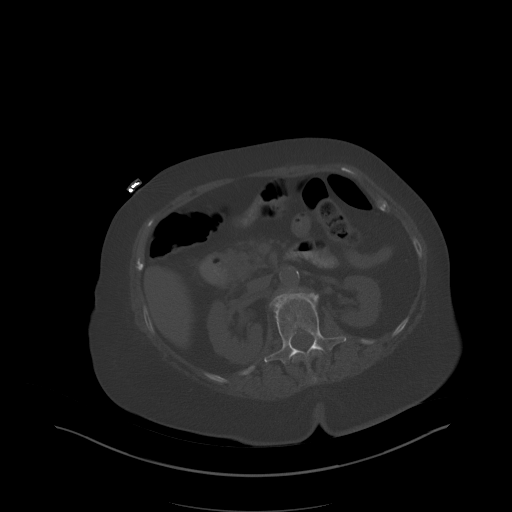
[im 78/111  soft-tissue]
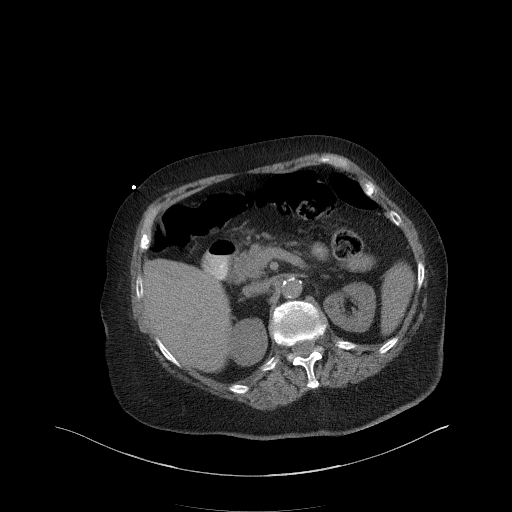
[im 88/111  soft-tissue]
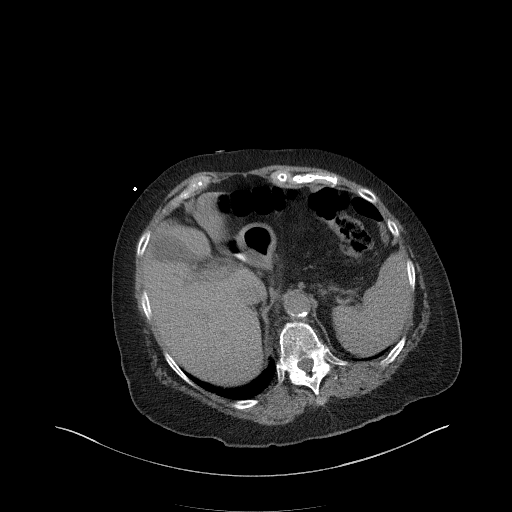
[im 97/111  soft-tissue]
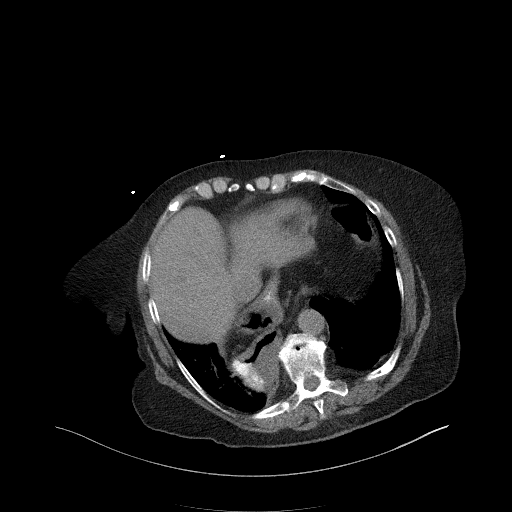
[im 106/111  soft-tissue]
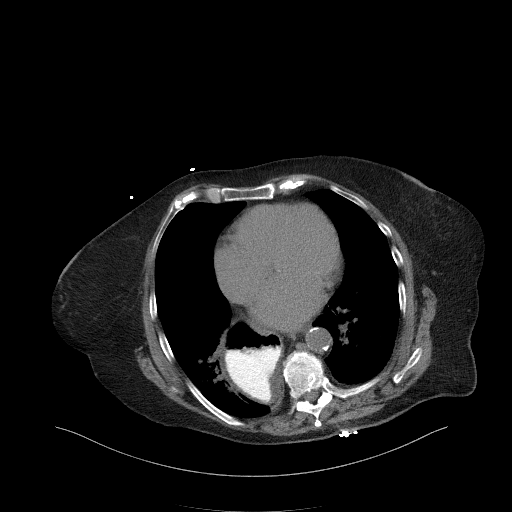

[Series 6: a/p w/o cor · coronal · non-contrast · 0.94mm/px · 3 of 163 slices shown]
[im 55/163  soft-tissue]
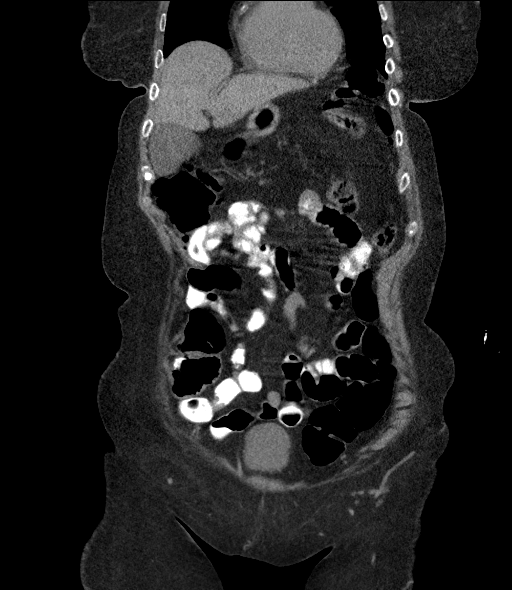
[im 73/163  soft-tissue]
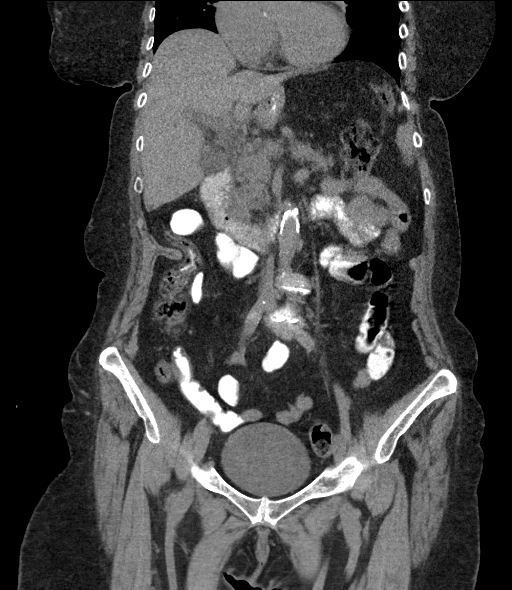
[im 91/163  soft-tissue]
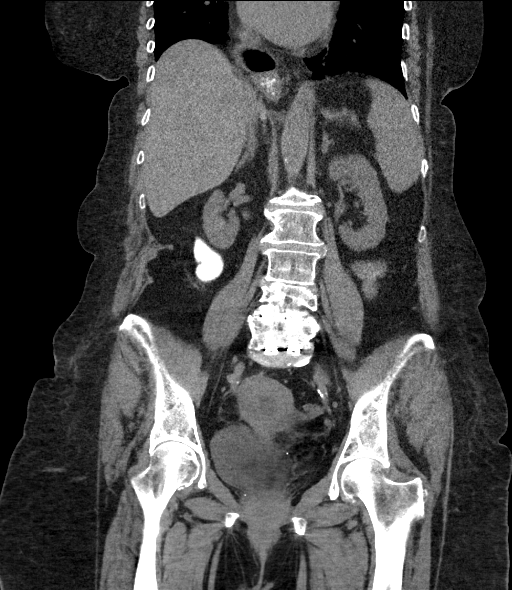

[16 of 46 positions shown; findings below may reference images not displayed]

FINDINGS: Lower chest: Large hiatal hernia without superimposed inflammation
or obstruction. Mild scar-like or atelectatic opacities in the lower
lungs. There are few subcentimeter pulmonary nodules in the lower
lungs that were also seen [DATE], consistent with benign
process.

Hepatobiliary: No focal liver abnormality.At least 1 gallstone is
visualized. Lost low-density appearance of the distal common bile
duct. No duct dilatation where discretely visible.

Pancreas: Mild stranding around the pancreas. No evidence of fluid
collection.

Spleen: Unremarkable.

Adrenals/Urinary Tract: Negative adrenals. No hydronephrosis or
stone. Unremarkable bladder.

Stomach/Bowel:  No obstruction. No appendicitis.

Vascular/Lymphatic: No acute vascular abnormality. No mass or
adenopathy.

Reproductive:No pathologic findings.

Other: No ascites or pneumoperitoneum.

Musculoskeletal: No acute finding. Asymmetric advanced right hip
osteoarthritis. L4-5 and L5-S1 discectomy with no evident bony
fusion. Biforaminal impingement at L4-5 and L5-S1.
IMPRESSION: 1. Mild stranding around the pancreas consistent with pancreatitis.
2. Distal common bile duct is poorly visualized and there could be
filling defect such is choledocholithiasis.
3. Cholelithiasis without cholecystitis.
4. Large hiatal hernia.

## 2017-04-05 MED ORDER — MORPHINE SULFATE (PF) 4 MG/ML IV SOLN
4.0000 mg | Freq: Once | INTRAVENOUS | Status: AC
  Administered 2017-04-05: 4 mg via INTRAVENOUS
  Filled 2017-04-05 (×2): qty 1

## 2017-04-05 MED ORDER — SODIUM CHLORIDE 0.9 % IV BOLUS (SEPSIS)
1000.0000 mL | Freq: Once | INTRAVENOUS | Status: AC
  Administered 2017-04-05: 1000 mL via INTRAVENOUS

## 2017-04-05 MED ORDER — ONDANSETRON HCL 4 MG/2ML IJ SOLN
4.0000 mg | Freq: Four times a day (QID) | INTRAMUSCULAR | Status: DC | PRN
  Administered 2017-04-06 – 2017-04-10 (×3): 4 mg via INTRAVENOUS
  Filled 2017-04-05 (×2): qty 2

## 2017-04-05 MED ORDER — METOPROLOL TARTRATE 50 MG PO TABS
50.0000 mg | ORAL_TABLET | Freq: Two times a day (BID) | ORAL | Status: DC
Start: 2017-04-05 — End: 2017-04-12
  Administered 2017-04-05 – 2017-04-12 (×12): 50 mg via ORAL
  Filled 2017-04-05 (×13): qty 1

## 2017-04-05 MED ORDER — ONDANSETRON HCL 4 MG PO TABS
4.0000 mg | ORAL_TABLET | Freq: Four times a day (QID) | ORAL | Status: DC | PRN
  Administered 2017-04-07: 4 mg via ORAL
  Filled 2017-04-05: qty 1

## 2017-04-05 MED ORDER — PIPERACILLIN-TAZOBACTAM 3.375 G IVPB 30 MIN
3.3750 g | Freq: Once | INTRAVENOUS | Status: AC
  Administered 2017-04-05: 3.375 g via INTRAVENOUS
  Filled 2017-04-05: qty 50

## 2017-04-05 MED ORDER — FENTANYL CITRATE (PF) 100 MCG/2ML IJ SOLN
25.0000 ug | Freq: Once | INTRAMUSCULAR | Status: AC
  Administered 2017-04-05: 25 ug via INTRAVENOUS
  Filled 2017-04-05: qty 2

## 2017-04-05 MED ORDER — CLONAZEPAM 0.5 MG PO TABS
0.2500 mg | ORAL_TABLET | Freq: Two times a day (BID) | ORAL | Status: DC
  Administered 2017-04-05 – 2017-04-12 (×11): 0.5 mg via ORAL
  Filled 2017-04-05 (×13): qty 1

## 2017-04-05 MED ORDER — ONDANSETRON HCL 4 MG/2ML IJ SOLN
4.0000 mg | Freq: Once | INTRAMUSCULAR | Status: AC
  Administered 2017-04-05: 4 mg via INTRAVENOUS
  Filled 2017-04-05: qty 2

## 2017-04-05 MED ORDER — PIPERACILLIN-TAZOBACTAM 4.5 G IVPB: 4.5000 g | Freq: Once | INTRAVENOUS | Status: DC

## 2017-04-05 MED ORDER — SODIUM CHLORIDE 0.9 % IV SOLN
INTRAVENOUS | Status: AC
  Administered 2017-04-05: 18:00:00 via INTRAVENOUS

## 2017-04-05 MED ORDER — PIPERACILLIN-TAZOBACTAM 3.375 G IVPB
3.3750 g | Freq: Three times a day (TID) | INTRAVENOUS | Status: DC
  Administered 2017-04-05 – 2017-04-06 (×3): 3.375 g via INTRAVENOUS
  Filled 2017-04-05 (×4): qty 50

## 2017-04-05 MED ORDER — BARIUM SULFATE 2.1 % PO SUSP
ORAL | Status: AC
Start: 2017-04-05 — End: 2017-04-05
  Filled 2017-04-05: qty 2

## 2017-04-05 MED ORDER — MORPHINE SULFATE (PF) 4 MG/ML IV SOLN
4.0000 mg | INTRAVENOUS | Status: DC | PRN
  Administered 2017-04-06: 4 mg via INTRAVENOUS
  Filled 2017-04-05: qty 1

## 2017-04-05 NOTE — ED Notes (Signed)
Walked patient to the bathroom patient could not get a urine sample patient is back in bed on the monitor with family at bedside and call bell in reach

## 2017-04-05 NOTE — ED Provider Notes (Signed)
[facility] DEPARTMENT Provider Note   CSN: [id number] Arrival date & time: 04/05/17  1007     History   Chief Complaint No chief complaint on file.   HPI  83 year old F with a history of hypertension, hypothyroidism, A. fib, GERD and anxiety, presents with abdominal pain with nausea vomiting, and one episode of diarrhea that started last night. Patient reports she was up most of the night with vomiting and pain, took 1 dose of Zofran at home this morning with minimal relief. Patient reports pain is epigastric, and is a constant sharp pain. Has not tried any medication for the pain at home, only Zofran for nausea. Patient reports she's not had much of an appetite since pain started. Denies any bloody emesis or stools. No history of abdominal surgeries. Denies fevers or chills, no dysuria.      Past Medical History:  Diagnosis Date  . Atrial fibrillation ([place])   . Atrial flutter ([place])    typical appearing  . Atrial tachycardia ([place])    ablated 11/17/10  by JA  from the [facility] of the aorta  . Dizziness    chronic and of an unclear etiology  . Gastritis   . GERD (gastroesophageal reflux disease)   . HTN (hypertension)   . Hyperlipemia   . Hypothyroidism   . Obesity   . Osteoporosis   . Vitamin D deficiency     Patient Active Problem List   Diagnosis Date Noted  . Acute delirium 04/14/2016  . Tremor 04/14/2016  . Weakness 04/10/2016  . Elevated CK 04/10/2016  . Chronic diastolic CHF (congestive heart failure) ([place]) 04/10/2016  . UTI (urinary tract infection) 04/09/2016  . Dehydration   . Fatigue 03/29/2014  . Essential hypertension, benign 01/18/2014  . Long term current use of anticoagulant therapy 01/18/2014  . Encounter for therapeutic drug monitoring 07/13/2013  . Bradycardia 09/16/2012  . Permanent atrial fibrillation ([place]) 06/10/2011  . Dizziness 12/24/2010    Past Surgical History:  Procedure Laterality Date  . ATRIAL ABLATION  SURGERY  11/17/10   Atrial tachycardia arising from [place] of the aorta ablated by JA    OB History    No data available       Home Medications    Prior to Admission medications   Medication Sig Start Date End Date Taking? Authorizing Provider  acetaminophen (TYLENOL) 325 MG tablet Take 325 mg by mouth every 4 (four) hours as needed for headache (pain). Reported on 10/04/2015   Yes [provider]  Calcium Carbonate-Vitamin D (CALCIUM-VITAMIN D) 600-200 MG-UNIT CAPS Take 1 capsule by mouth daily. Reported on 10/04/2015   Yes [provider]  Cholecalciferol (VITAMIN D3) 1000 UNITS CAPS Take 1,000 Units by mouth daily. Reported on 10/04/2015   Yes [provider]  clonazePAM (KLONOPIN) 0.5 MG tablet Take 0.5-1 tablets (0.25-0.5 mg total) by mouth 2 (two) times daily. 04/13/16  Yes Sheikh, Omair Latif, DO  Cyanocobalamin 1000 MCG/ML KIT Inject 1,000 mcg as directed every 30 (thirty) days.   Yes [provider]  levothyroxine (SYNTHROID, LEVOTHROID) 88 MCG tablet Take 1 tablet (88 mcg total) by mouth daily before breakfast. 04/21/16  Yes Lauree Chandler, NP  metoprolol (LOPRESSOR) 50 MG tablet Take 1 tablet (50 mg total) by mouth 2 (two) times daily. 04/21/16  Yes Lauree Chandler, NP  [place]  Peripheral Neuropathy Cream- Bupivacaine 1%, Doxepin 3%, Gabapentin 6%, Pentoxifylline 3%, Topiramate 1% Apply 1-2 grams to affected area 3-4  times daily Qty. 120 gm 3 refills   Yes [provider]  ondansetron (ZOFRAN) 4 MG tablet Take 4 mg by mouth every 8 (eight) hours as needed for nausea or vomiting.   Yes [provider]  OVER THE COUNTER MEDICATION Place 1 drop into both eyes daily as needed (dry eyes). Over the counter lubricating eye drop   Yes [provider]  warfarin (COUMADIN) 5 MG tablet Take 1/2 to 1 tablet daily as directed by coumadin clinic Patient taking differently: Take 2.5-5 mg by mouth See admin  instructions. 2.5 mg on Sunday-all other days 5 mg 03/09/17  Yes Jettie Booze, MD    Family History Family History  Problem Relation Age of Onset  . Heart attack Father   . Hypertension Father     Social History Social History  Substance Use Topics  . Smoking status: Never Smoker  . Smokeless tobacco: Never Used  . Alcohol use No     Allergies   Iodinated diagnostic agents and Penicillins   Review of Systems Review of Systems   Physical Exam Updated Vital Signs BP (!) 159/98   Pulse (!) 108   Temp 97.6 F (36.4 C) (Oral)   Resp (!) 25   SpO2 100%   Physical Exam  Constitutional: She appears well-developed and well-nourished. No distress.  HENT:  Head: Normocephalic and atraumatic.  Eyes: Right eye exhibits no discharge. Left eye exhibits no discharge.  Cardiovascular: Normal rate, regular rhythm, normal heart sounds and intact distal pulses.   Pulmonary/Chest: Effort normal and breath sounds normal. No respiratory distress. She has no wheezes. She has no rales.  Abdominal: Soft. She exhibits no mass. There is tenderness. There is guarding.  Sounds hyperactive, patient is very tender at the epigastrium and right upper quadrant, with some guarding, positive Murphy sign, no lower abdominal tenderness, no tenderness at McBurney's point  Musculoskeletal: She exhibits no edema or deformity.  Neurological: She is alert. Coordination normal.  Speech is clear, able to follow commands CN III-XII intact Normal strength in upper and lower extremities bilaterally including dorsiflexion and plantar flexion, strong and equal grip strength Sensation normal to light and sharp touch Moves extremities without ataxia, coordination intact    Skin: Skin is warm and dry. Capillary refill takes less than 2 seconds. She is not diaphoretic.  Psychiatric: She has a normal mood and affect. Her behavior is normal.  Nursing note and vitals reviewed.    ED Treatments / Results    Labs (all labs ordered are listed, but only abnormal results are displayed) Labs Reviewed  LIPASE, BLOOD - Abnormal; Notable for the following:       Result Value   Lipase 3,514 (*)    All other components within normal limits  COMPREHENSIVE METABOLIC PANEL - Abnormal; Notable for the following:    Glucose, Bld 133 (*)    AST 308 (*)    ALT 140 (*)    Total Bilirubin 2.7 (*)    GFR calc non Af Amer 52 (*)    All other components within normal limits  CBC - Abnormal; Notable for the following:    WBC 15.1 (*)    Hemoglobin 15.5 (*)    All other components within normal limits  I-STAT CG4 LACTIC ACID, ED - Abnormal; Notable for the following:    Lactic Acid, Venous 2.51 (*)    All other components within normal limits  CULTURE, BLOOD (ROUTINE X 2)  CULTURE, BLOOD (ROUTINE X 2)  URINALYSIS, ROUTINE W REFLEX MICROSCOPIC  I-STAT CG4 LACTIC ACID, ED    EKG  EKG Interpretation  Date/Time:  Tuesday April 05 2017 10:20:21 EDT Ventricular Rate:  131 PR Interval:    QRS Duration: 72 QT Interval:  320 QTC Calculation: 472 R Axis:   22 Text Interpretation:  Atrial fibrillation with rapid ventricular response Low voltage QRS Cannot rule out Anterior infarct , age undetermined ST & T wave abnormality, consider inferolateral ischemia Abnormal ECG excessive baseline artifact. appears similar to previous, need repeat. Confirmed by Charlesetta Shanks [phone]) on 04/05/2017 1:15:29 PM       Radiology No results found.  Procedures Procedures (including critical care time)  Medications Ordered in ED Medications  sodium chloride 0.9 % bolus 1,000 mL (1,000 mLs Intravenous New Bag/Given 04/05/17 1217)    And  sodium chloride 0.9 % bolus 1,000 mL (1,000 mLs Intravenous New Bag/Given 04/05/17 1217)    And  sodium chloride 0.9 % bolus 1,000 mL (1,000 mLs Intravenous New Bag/Given 04/05/17 1218)  ondansetron (ZOFRAN) injection 4 mg (not administered)  fentaNYL (SUBLIMAZE) injection 25 mcg  (not administered)  Barium Sulfate 2.1 % SUSP (not administered)  piperacillin-tazobactam (ZOSYN) IVPB 3.375 g (not administered)  piperacillin-tazobactam (ZOSYN) IVPB 3.375 g (not administered)     Initial Impression / Assessment and Plan / ED Course  I have reviewed the triage vital signs and the nursing notes.  Pertinent labs & imaging results that were available during my care of the patient were reviewed by me and considered in my medical decision making (see chart for details).  She presents with acute onset epigastric and right upper quadrant pain, with associated nausea and vomiting. Pt mildly tachycardic, vitals otherwise stable, and pt in NAD. Patient tender in the epigastrium and right upper quadrant with positive Murphy sign. Labs significant for a leukocytosis to 15.1, lactate elevated to 2.51, liver enzymes elevated, and lipase >3000. Presentation and labs concerning for pancreatitis likely caused by gallstone. Will get abdominal CT.  Sepsis protocol initiated, patient started on sepsis fluids, and covered with Zosyn.   CT Abdomen shows Mild stranding around the pancreas consistent with pancreatitis, the distal common bile duct is poorly visualized and there could be filling defect such is choledocholithiasis, gallbladder showsCholelithiasis without cholecystitis, there is a Large hiatal hernia, which has been seen on previous imaging.  Hospitalist paged for admission, patient discussed with Dr. Shanon Brow, who will see and admit the patient. Paged GI multiple times for consult with no answer.  Patient discussed with Dr. Colvin Caroli, who saw patient as well and agrees with plan.   Final Clinical Impressions(s) / ED Diagnoses   Final diagnoses:  Acute pancreatitis, unspecified complication status, unspecified pancreatitis type    New Prescriptions New Prescriptions   No medications on file     Janet Berlin 04/05/17 1712    Charlesetta Shanks, MD 04/09/17 1801

## 2017-04-05 NOTE — ED Provider Notes (Signed)
Medical screening examination/treatment/procedure(s) were conducted as a shared visit with non-physician practitioner(s) and myself.  I personally evaluated the patient during the encounter.   EKG Interpretation  Date/Time:  Tuesday April 05 2017 10:20:21 EDT Ventricular Rate:  131 PR Interval:    QRS Duration: 72 QT Interval:  320 QTC Calculation: 472 R Axis:   22 Text Interpretation:  Atrial fibrillation with rapid ventricular response Low voltage QRS Cannot rule out Anterior infarct , age undetermined ST & T wave abnormality, consider inferolateral ischemia Abnormal ECG excessive baseline artifact. appears similar to previous, need repeat. Confirmed by Charlesetta Shanks [phone]) on 04/05/2017 1:15:29 PM     Patient has abdominal pain that started yesterday evening.  She has had associated diarrhea.  Multiple episodes of vomiting as well.  Pain is epigastric and right upper quadrant. On examination patient is alert and appropriate.  She is in no respiratory distress.  Heart is borderline tachycardic.  Lungs are clear.  The patient has epigastric and right upper quadrant pain without active guarding.  Lower abdomen nontender.  Mental status is clear. Patient has leukocytosis, lactic acidosis, tachycardia, active abdominal pain with suspicion for gallstone pancreatitis.  Sepsis protocol has been initiated.  We will be proceeding with CT scan, surgical and GI consults. I agree with plan and management. Marland Kitchen   Charlesetta Shanks, MD 04/05/17 1322

## 2017-04-05 NOTE — ED Triage Notes (Signed)
Patient complains of onset of abdominal cramping with vomiting and diarrhea since last night. Took zofran early am with some relief. Patient appears anxious on arrival. States that she has anxiety as well.

## 2017-04-05 NOTE — Progress Notes (Signed)
Pharmacy Antibiotic Note  Charlotte Castro is a 83 y.o. female admitted on 04/05/2017 with intra-abdmonial infection.  Pharmacy has been consulted for Zosyn dosing. WBC elevated at 15.1. Patient has unknown allergy listed to penicillins. On inquiry, patient is not sure if she is allergic. She seems to have tolerated a dose of penicillin in May, 2017. SCr 0.98. LA 2.51  Plan: -Zosyn 3.375 gm IV Q 8 hours -Monitor CBC, renal fx, cultures and clinical progress     Temp (24hrs), Avg:97.6 F (36.4 C), Min:97.6 F (36.4 C), Max:97.6 F (36.4 C)   Recent Labs Lab 04/05/17 1010 04/05/17 1019  WBC 15.1*  --   CREATININE 0.98  --   LATICACIDVEN  --  2.51*    CrCl cannot be calculated (Unknown ideal weight.).    Allergies  Allergen Reactions  . Iodinated Diagnostic Agents Shortness Of Breath    headache  . Penicillins Other (See Comments)    Antimicrobials this admission: Zosyn 10/23 >>   Dose adjustments this admission: None   Microbiology results: 10/23 BCx:    Thank you for allowing pharmacy to be a part of this patient's care.  Albertina Parr, PharmD., BCPS Clinical Pharmacist Pager [phone]

## 2017-04-05 NOTE — H&P (Signed)
History and Physical    GAE BIHL XTG:[id number] DOB: [birth year] DOA: 04/05/2017  PCP: Leeroy Cha, MD  Patient coming from: home  Chief Complaint:   N/v abdominal pain  HPI: Charlotte Castro is a 83 y.o. female with medical history significant of afib, htn, GERD comes in with nausea and vomiting associated with epigastric pain going to the ruq with sudden onset last night.  No fevers.  No diarrhea.  Still has gallbladder.  Worse after trying to eat something.  Found to have acute gallstone pancreatitis and referred for admission for such.  Her pain is much improved since arrival to the ED.  Review of Systems: As per HPI otherwise 10 point review of systems negative.   Past Medical History:  Diagnosis Date  . Atrial fibrillation ([place])   . Atrial flutter ([place])    typical appearing  . Atrial tachycardia ([place])    ablated 11/17/10  by JA  from the [facility] of the aorta  . Dizziness    chronic and of an unclear etiology  . Gastritis   . GERD (gastroesophageal reflux disease)   . HTN (hypertension)   . Hyperlipemia   . Hypothyroidism   . Obesity   . Osteoporosis   . Vitamin D deficiency     Past Surgical History:  Procedure Laterality Date  . ATRIAL ABLATION SURGERY  11/17/10   Atrial tachycardia arising from [facility] of the aorta ablated by JA     reports that she has never smoked. She has never used smokeless tobacco. She reports that she does not drink alcohol or use drugs.  Allergies  Allergen Reactions  . Iodinated Diagnostic Agents Shortness Of Breath    headache  . Penicillins Other (See Comments)    Family History  Problem Relation Age of Onset  . Heart attack Father   . Hypertension Father     Prior to Admission medications   Medication Sig Start Date End Date Taking? Authorizing Provider  acetaminophen (TYLENOL) 325 MG tablet Take 325 mg by mouth every 4 (four) hours as needed for headache (pain). Reported on 10/04/2015   Yes [provider]    Calcium Carbonate-Vitamin D (CALCIUM-VITAMIN D) 600-200 MG-UNIT CAPS Take 1 capsule by mouth daily. Reported on 10/04/2015   Yes [provider]  Cholecalciferol (VITAMIN D3) 1000 UNITS CAPS Take 1,000 Units by mouth daily. Reported on 10/04/2015   Yes [provider]  clonazePAM (KLONOPIN) 0.5 MG tablet Take 0.5-1 tablets (0.25-0.5 mg total) by mouth 2 (two) times daily. 04/13/16  Yes Sheikh, Omair Latif, DO  Cyanocobalamin 1000 MCG/ML KIT Inject 1,000 mcg as directed every 30 (thirty) days.   Yes [provider]  levothyroxine (SYNTHROID, LEVOTHROID) 88 MCG tablet Take 1 tablet (88 mcg total) by mouth daily before breakfast. 04/21/16  Yes Lauree Chandler, NP  metoprolol (LOPRESSOR) 50 MG tablet Take 1 tablet (50 mg total) by mouth 2 (two) times daily. 04/21/16  Yes Lauree Chandler, NP  [place]  Peripheral Neuropathy Cream- Bupivacaine 1%, Doxepin 3%, Gabapentin 6%, Pentoxifylline 3%, Topiramate 1% Apply 1-2 grams to affected area 3-4 times daily Qty. 120 gm 3 refills   Yes [provider]  ondansetron (ZOFRAN) 4 MG tablet Take 4 mg by mouth every 8 (eight) hours as needed for nausea or vomiting.   Yes [provider]  OVER THE COUNTER MEDICATION Place 1 drop into both eyes daily as needed (dry eyes). Over the counter lubricating eye drop  Yes [provider]  warfarin (COUMADIN) 5 MG tablet Take 1/2 to 1 tablet daily as directed by coumadin clinic Patient taking differently: Take 2.5-5 mg by mouth See admin instructions. 2.5 mg on Sunday-all other days 5 mg 03/09/17  Yes Jettie Booze, MD    Physical Exam: Vitals:   04/05/17 1330 04/05/17 1400 04/05/17 1430 04/05/17 1500  BP: (!) 156/87 (!) 176/90 (!) 150/94 (!) 150/62  Pulse: 94 97 87 87  Resp: _0 Temp:      TempSrc:      SpO2: 98% 96% 97% 99%      Constitutional: NAD, calm, comfortable Vitals:   04/05/17 1330 04/05/17 1400 04/05/17  1430 04/05/17 1500  BP: (!) 156/87 (!) 176/90 (!) 150/94 (!) 150/62  Pulse: 94 97 87 87  Resp: _1 Temp:      TempSrc:      SpO2: 98% 96% 97% 99%   Eyes: PERRL, lids and conjunctivae normal ENMT: Mucous membranes are moist. Posterior pharynx clear of any exudate or lesions.Normal dentition.  Neck: normal, supple, no masses, no thyromegaly Respiratory: clear to auscultation bilaterally, no wheezing, no crackles. Normal respiratory effort. No accessory muscle use.  Cardiovascular: Regular rate and rhythm, no murmurs / rubs / gallops. No extremity edema. 2+ pedal pulses. No carotid bruits.  Abdomen: epigastric/ruq tenderness, no masses palpated. No hepatosplenomegaly. Bowel sounds positive.  Musculoskeletal: no clubbing / cyanosis. No joint deformity upper and lower extremities. Good ROM, no contractures. Normal muscle tone.  Skin: no rashes, lesions, ulcers. No induration Neurologic: CN 2-12 grossly intact. Sensation intact, DTR normal. Strength 5/5 in all 4.  Psychiatric: Normal judgment and insight. Alert and oriented x 3. Normal mood.    Labs on Admission: I have personally reviewed following labs and imaging studies  CBC:  Recent Labs Lab 04/05/17 1010  WBC 15.1*  HGB 15.5*  HCT 44.4  MCV 92.9  PLT 818   Basic Metabolic Panel:  Recent Labs Lab 04/05/17 1010  NA 140  K 4.5  CL 106  CO2 23  GLUCOSE 133*  BUN 19  CREATININE 0.98  CALCIUM 10.0   GFR: CrCl cannot be calculated (Unknown ideal weight.). Liver Function Tests:  Recent Labs Lab 04/05/17 1010  AST 308*  ALT 140*  ALKPHOS 98  BILITOT 2.7*  PROT 7.0  ALBUMIN 4.1    Recent Labs Lab 04/05/17 1010  LIPASE 3,514*   Radiological Exams on Admission: Ct Abdomen Pelvis Wo Contrast  Result Date: 04/05/2017 CLINICAL DATA:  Unspecified abdominal pain EXAM: CT ABDOMEN AND PELVIS WITHOUT CONTRAST TECHNIQUE: Multidetector CT imaging of the abdomen and pelvis was performed following the standard  protocol without IV contrast. COMPARISON:  10/10/2015 FINDINGS: Lower chest: Large hiatal hernia without superimposed inflammation or obstruction. Mild scar-like or atelectatic opacities in the lower lungs. There are few subcentimeter pulmonary nodules in the lower lungs that were also seen 10/10/2015, consistent with benign process. Hepatobiliary: No focal liver abnormality.At least 1 gallstone is visualized. Lost low-density appearance of the distal common bile duct. No duct dilatation where discretely visible. Pancreas: Mild stranding around the pancreas. No evidence of fluid collection. Spleen: Unremarkable. Adrenals/Urinary Tract: Negative adrenals. No hydronephrosis or stone. Unremarkable bladder. Stomach/Bowel:  No obstruction. No appendicitis. Vascular/Lymphatic: No acute vascular abnormality. No mass or adenopathy. Reproductive:No pathologic findings. Other: No ascites or pneumoperitoneum. Musculoskeletal: No acute finding. Asymmetric advanced right hip osteoarthritis. L4-5 and L5-S1 discectomy with no evident bony fusion. Biforaminal impingement at  L4-5 and L5-S1. IMPRESSION: 1. Mild stranding around the pancreas consistent with pancreatitis. 2. Distal common bile duct is poorly visualized and there could be filling defect such is choledocholithiasis. 3. Cholelithiasis without cholecystitis. 4. Large hiatal hernia. Electronically Signed   By: Monte Fantasia M.D.   On: 04/05/2017 14:09   Old chart reviewed Case discussed with edp   Assessment/Plan 83 yo female with acute onset off n/v/epigastic abdominal pain found to have gallstone pancreatitis with cbd stone  Principal Problem:   Acute gallstone pancreatitis-  Given 3 liters of ivf in ED.  Cont at 150cc/hour.  Keep npo x meds.  Lipase over 3000.  Repeat cmp and lipase in am.  GI consulted for ERCP prior to surgical evaluation for cholecystecomy.  Aggressive ivf.  Serial lactic until normal, around 3.   Pt appears nontoxic.  Cont zosyn for abx  coverage.  Active Problems:   Abdominal pain, acute, epigastric- due to above, prn morphine ordered   Choledocholithiasis with obstruction- as above   Permanent atrial fibrillation ([place])- noted, ck inr on coumadin   Essential hypertension, benign- stable   Chronic diastolic CHF (congestive heart failure) ([place])- stable at this time and compensated     DVT prophylaxis:  scds Code Status:  full Family Communication:  dtr Disposition Plan:  Per day team Consults called:  GI will need surgery at some point Admission status:   admission   DAVID,RACHAL A MD [facility]  If 7PM-7AM, please contact night-coverage [email or web address] Password [email or web address]  04/05/2017, 3:22 PM

## 2017-04-06 ENCOUNTER — Ambulatory Visit: Payer: Medicare Other | Admitting: Neurology

## 2017-04-06 ENCOUNTER — Ambulatory Visit: Payer: Medicare Other | Admitting: Interventional Cardiology

## 2017-04-06 LAB — COMPREHENSIVE METABOLIC PANEL
ALBUMIN: 2.8 g/dL — AB (ref 3.5–5.0)
ALT: 123 U/L — ABNORMAL HIGH (ref 14–54)
ANION GAP: 4 — AB (ref 5–15)
AST: 129 U/L — ABNORMAL HIGH (ref 15–41)
Alkaline Phosphatase: 78 U/L (ref 38–126)
BUN: 12 mg/dL (ref 6–20)
CHLORIDE: 111 mmol/L (ref 101–111)
CO2: 25 mmol/L (ref 22–32)
CREATININE: 0.97 mg/dL (ref 0.44–1.00)
Calcium: 8.3 mg/dL — ABNORMAL LOW (ref 8.9–10.3)
GFR calc non Af Amer: 53 mL/min — ABNORMAL LOW (ref >60)
GLUCOSE: 82 mg/dL (ref 65–99)
Potassium: 3.8 mmol/L (ref 3.5–5.1)
SODIUM: 140 mmol/L (ref 135–145)
Total Bilirubin: 2 mg/dL — ABNORMAL HIGH (ref 0.3–1.2)
Total Protein: 5.4 g/dL — ABNORMAL LOW (ref 6.5–8.1)

## 2017-04-06 LAB — CBC
HCT: 35.9 % — ABNORMAL LOW (ref 36.0–46.0)
HEMOGLOBIN: 12.6 g/dL (ref 12.0–15.0)
MCH: 32.9 pg (ref 26.0–34.0)
MCHC: 35.1 g/dL (ref 30.0–36.0)
MCV: 93.7 fL (ref 78.0–100.0)
Platelets: 126 10*3/uL — ABNORMAL LOW (ref 150–400)
RBC: 3.83 MIL/uL — AB (ref 3.87–5.11)
RDW: 13.5 % (ref 11.5–15.5)
WBC: 6.9 10*3/uL (ref 4.0–10.5)

## 2017-04-06 LAB — LIPASE, BLOOD: Lipase: 364 U/L — ABNORMAL HIGH (ref 11–51)

## 2017-04-06 MED ORDER — ADULT MULTIVITAMIN W/MINERALS CH
1.0000 | ORAL_TABLET | Freq: Every day | ORAL | Status: DC
  Administered 2017-04-06 – 2017-04-12 (×5): 1 via ORAL
  Filled 2017-04-06 (×7): qty 1

## 2017-04-06 MED ORDER — SODIUM CHLORIDE 0.9 % IV SOLN
INTRAVENOUS | Status: DC
  Administered 2017-04-06 – 2017-04-08 (×7): via INTRAVENOUS

## 2017-04-06 NOTE — Consult Note (Signed)
[facility] Surgery Consult/Admission Note  Charlotte Castro 1932/10/09  [id number].    Requesting MD: Dr. Maylene Roes  Chief Complaint/Reason for Consult: gallstone pancreatitis  HPI:   Pt is a 83 year old female with a history of a.fib, HTN, GERD, hypothyroidism, who presented to the ED with complaints of severe, epigastric pain that radiaited up into her chest and RUQ with associated nausea, vomiting and one episode of diarrhea. Pt states similar pain roughly 3 months ago but not this severe. Lipase was 3514 yesterday (10/23) trending down to 364 today. CT abd/pel showed pancreatitis, large hiatal hernia, cholelithiasis and distal CBD poorly visualized with possible filing defect. Pt takes coumadin at home. Being held. GI following. No history of abdominal surgeries. We were asked to see for possible cholecystectomy.  ROS:  Review of Systems  Constitutional: Negative for chills and fever.  Respiratory: Negative for shortness of breath.   Cardiovascular: Negative for chest pain.  Gastrointestinal: Positive for abdominal pain, diarrhea, nausea and vomiting. Negative for constipation.  Musculoskeletal: Negative for falls.  Skin: Negative for rash.  Neurological: Negative for dizziness and loss of consciousness.  All other systems reviewed and are negative.    Family History  Problem Relation Age of Onset  . Heart attack Father   . Hypertension Father     Past Medical History:  Diagnosis Date  . Anxiety   . Arthritis    "left knee" (04/05/2017)  . Atrial fibrillation ([place])   . Atrial flutter ([place])    typical appearing  . Atrial tachycardia ([place])    ablated 11/17/10  by JA  from the [facility] of the aorta  . Chronic lower back pain   . Dizziness    chronic and of an unclear etiology  . Gastritis   . GERD (gastroesophageal reflux disease)   . History of blood transfusion ~ 1948  . HTN (hypertension)   . Hyperlipemia   . Hypothyroidism   . Obesity   . Osteoporosis   . Sinus  headache   . Vitamin D deficiency     Past Surgical History:  Procedure Laterality Date  . ATRIAL ABLATION SURGERY  11/17/10   Atrial tachycardia arising from [facility] of the aorta ablated by JA  . BACK SURGERY    . CARDIAC CATHETERIZATION  01/11/2011   Archie Endo 01/12/2011  . FRACTURE SURGERY    . LUMBAR SPINE SURGERY  ~ 1998  . SHOULDER SURGERY Right 1984 X2   Rollene Rotunda 01/19/2011  . WRIST FRACTURE SURGERY Left 1983    Social History:  reports that she has never smoked. She has never used smokeless tobacco. She reports that she does not drink alcohol or use drugs.  Allergies:  Allergies  Allergen Reactions  . Iodinated Diagnostic Agents Shortness Of Breath    headache  . Penicillins Other (See Comments)    Medications Prior to Admission  Medication Sig Dispense Refill  . acetaminophen (TYLENOL) 325 MG tablet Take 325 mg by mouth every 4 (four) hours as needed for headache (pain). Reported on 10/04/2015    . Calcium Carbonate-Vitamin D (CALCIUM-VITAMIN D) 600-200 MG-UNIT CAPS Take 1 capsule by mouth daily. Reported on 10/04/2015    . Cholecalciferol (VITAMIN D3) 1000 UNITS CAPS Take 1,000 Units by mouth daily. Reported on 10/04/2015    . clonazePAM (KLONOPIN) 0.5 MG tablet Take 0.5-1 tablets (0.25-0.5 mg total) by mouth 2 (two) times daily. 10 tablet 0  . Cyanocobalamin 1000 MCG/ML KIT Inject 1,000 mcg as directed every 30 (thirty) days.    Marland Kitchen  levothyroxine (SYNTHROID, LEVOTHROID) 88 MCG tablet Take 1 tablet (88 mcg total) by mouth daily before breakfast. 30 tablet 0  . metoprolol (LOPRESSOR) 50 MG tablet Take 1 tablet (50 mg total) by mouth 2 (two) times daily. 60 tablet 0  . NON FORMULARY [facility]  Peripheral Neuropathy Cream- Bupivacaine 1%, Doxepin 3%, Gabapentin 6%, Pentoxifylline 3%, Topiramate 1% Apply 1-2 grams to affected area 3-4 times daily Qty. 120 gm 3 refills    . ondansetron (ZOFRAN) 4 MG tablet Take 4 mg by mouth every 8 (eight) hours as needed for nausea or vomiting.     Marland Kitchen OVER THE COUNTER MEDICATION Place 1 drop into both eyes daily as needed (dry eyes). Over the counter lubricating eye drop    . warfarin (COUMADIN) 5 MG tablet Take 1/2 to 1 tablet daily as directed by coumadin clinic (Patient taking differently: Take 2.5-5 mg by mouth See admin instructions. 2.5 mg on Sunday-all other days 5 mg) 30 tablet 3    Blood pressure 135/60, pulse 75, temperature 98.6 F (37 C), temperature source Oral, resp. rate 16, height 5' 8"  (1.727 m), weight 217 lb 9.6 oz (98.7 kg), SpO2 99 %.  Physical Exam  Constitutional: She is oriented to person, place, and time and well-developed, well-nourished, and in no distress. Vital signs are normal. No distress.  HENT:  Head: Normocephalic and atraumatic.  Nose: Nose normal.  Mouth/Throat: Oropharynx is clear and moist. No oropharyngeal exudate.  Eyes: Right eye exhibits no discharge. Left eye exhibits no discharge. No scleral icterus.  Pupils are equal and round  Neck: Normal range of motion. Neck supple. No tracheal deviation present. No thyromegaly present.  Cardiovascular: Normal rate, regular rhythm, normal heart sounds and intact distal pulses.  Exam reveals no gallop and no friction rub.   No murmur heard. Pulses:      Radial pulses are 2+ on the right side, and 2+ on the left side.       Dorsalis pedis pulses are 2+ on the right side, and 2+ on the left side.  Pulmonary/Chest: Effort normal and breath sounds normal. No respiratory distress. She has no decreased breath sounds. She has no wheezes. She has no rhonchi. She has no rales.  Abdominal: Soft. Normal appearance and bowel sounds are normal. She exhibits no distension and no mass. There is no hepatosplenomegaly. There is tenderness in the epigastric area. There is no rebound, no guarding and negative Murphy's sign.  Musculoskeletal: Normal range of motion. She exhibits no edema.  Lymphadenopathy:    She has no cervical adenopathy.  Neurological: She is alert  and oriented to person, place, and time. GCS score is 15.  Skin: Skin is warm and dry. No rash noted. She is not diaphoretic.  Psychiatric: Mood and affect normal.  Nursing note and vitals reviewed.   Results for orders placed or performed during the hospital encounter of 04/05/17 (from the past 48 hour(s))  Lipase, blood     Status: Abnormal   Collection Time: 04/05/17 10:10 AM  Result Value Ref Range   Lipase 3,514 (H) 11 - 51 U/L    Comment: RESULTS CONFIRMED BY MANUAL DILUTION  Comprehensive metabolic panel     Status: Abnormal   Collection Time: 04/05/17 10:10 AM  Result Value Ref Range   Sodium 140 135 - 145 mmol/L   Potassium 4.5 3.5 - 5.1 mmol/L   Chloride 106 101 - 111 mmol/L   CO2 23 22 - 32 mmol/L   Glucose, Bld 133 (  H) 65 - 99 mg/dL   BUN 19 6 - 20 mg/dL   Creatinine, Ser 0.98 0.44 - 1.00 mg/dL   Calcium 10.0 8.9 - 10.3 mg/dL   Total Protein 7.0 6.5 - 8.1 g/dL   Albumin 4.1 3.5 - 5.0 g/dL   AST 308 (H) 15 - 41 U/L   ALT 140 (H) 14 - 54 U/L   Alkaline Phosphatase 98 38 - 126 U/L   Total Bilirubin 2.7 (H) 0.3 - 1.2 mg/dL   GFR calc non Af Amer 52 (L) >60 mL/min   GFR calc Af Amer >60 >60 mL/min    Comment: (NOTE) The eGFR has been calculated using the CKD EPI equation. This calculation has not been validated in all clinical situations. eGFR's persistently <60 mL/min signify possible Chronic Kidney Disease.    Anion gap 11 5 - 15  CBC     Status: Abnormal   Collection Time: 04/05/17 10:10 AM  Result Value Ref Range   WBC 15.1 (H) 4.0 - 10.5 K/uL   RBC 4.78 3.87 - 5.11 MIL/uL   Hemoglobin 15.5 (H) 12.0 - 15.0 g/dL   HCT 44.4 36.0 - 46.0 %   MCV 92.9 78.0 - 100.0 fL   MCH 32.4 26.0 - 34.0 pg   MCHC 34.9 30.0 - 36.0 g/dL   RDW 13.6 11.5 - 15.5 %   Platelets 180 150 - 400 K/uL  I-Stat CG4 Lactic Acid, ED     Status: Abnormal   Collection Time: 04/05/17 10:19 AM  Result Value Ref Range   Lactic Acid, Venous 2.51 (HH) 0.5 - 1.9 mmol/L   Comment NOTIFIED  PHYSICIAN   Blood Culture (routine x 2)     Status: None (Preliminary result)   Collection Time: 04/05/17 11:34 AM  Result Value Ref Range   Specimen Description BLOOD RIGHT ANTECUBITAL    Special Requests      BOTTLES DRAWN AEROBIC AND ANAEROBIC Blood Culture results may not be optimal due to an excessive volume of blood received in culture bottles   Culture NO GROWTH < 24 HOURS    Report Status PENDING   Blood Culture (routine x 2)     Status: None (Preliminary result)   Collection Time: 04/05/17 11:51 AM  Result Value Ref Range   Specimen Description BLOOD LEFT FOREARM    Special Requests      BOTTLES DRAWN AEROBIC AND ANAEROBIC Blood Culture adequate volume   Culture NO GROWTH < 24 HOURS    Report Status PENDING   I-Stat CG4 Lactic Acid, ED     Status: Abnormal   Collection Time: 04/05/17 12:33 PM  Result Value Ref Range   Lactic Acid, Venous 3.18 (HH) 0.5 - 1.9 mmol/L   Comment NOTIFIED PHYSICIAN   Protime-INR     Status: Abnormal   Collection Time: 04/05/17  5:43 PM  Result Value Ref Range   Prothrombin Time 23.2 (H) 11.4 - 15.2 seconds   INR 2.07   Lactic acid, plasma     Status: None   Collection Time: 04/05/17  5:43 PM  Result Value Ref Range   Lactic Acid, Venous 1.3 0.5 - 1.9 mmol/L  Lactic acid, plasma     Status: None   Collection Time: 04/05/17  8:25 PM  Result Value Ref Range   Lactic Acid, Venous 0.9 0.5 - 1.9 mmol/L  Urinalysis, Routine w reflex microscopic     Status: Abnormal   Collection Time: 04/05/17  8:41 PM  Result Value Ref Range  Color, Urine YELLOW YELLOW   APPearance CLEAR CLEAR   Specific Gravity, Urine 1.015 1.005 - 1.030   pH 7.0 5.0 - 8.0   Glucose, UA NEGATIVE NEGATIVE mg/dL   Hgb urine dipstick NEGATIVE NEGATIVE   Bilirubin Urine NEGATIVE NEGATIVE   Ketones, ur NEGATIVE NEGATIVE mg/dL   Protein, ur NEGATIVE NEGATIVE mg/dL   Nitrite POSITIVE (A) NEGATIVE   Leukocytes, UA NEGATIVE NEGATIVE   RBC / HPF 0-5 0 - 5 RBC/hpf   WBC, UA 0-5 0  - 5 WBC/hpf   Bacteria, UA RARE (A) NONE SEEN   Squamous Epithelial / LPF 0-5 (A) NONE SEEN  Comprehensive metabolic panel     Status: Abnormal   Collection Time: 04/06/17  5:21 AM  Result Value Ref Range   Sodium 140 135 - 145 mmol/L   Potassium 3.8 3.5 - 5.1 mmol/L   Chloride 111 101 - 111 mmol/L   CO2 25 22 - 32 mmol/L   Glucose, Bld 82 65 - 99 mg/dL   BUN 12 6 - 20 mg/dL   Creatinine, Ser 0.97 0.44 - 1.00 mg/dL   Calcium 8.3 (L) 8.9 - 10.3 mg/dL   Total Protein 5.4 (L) 6.5 - 8.1 g/dL   Albumin 2.8 (L) 3.5 - 5.0 g/dL   AST 129 (H) 15 - 41 U/L   ALT 123 (H) 14 - 54 U/L   Alkaline Phosphatase 78 38 - 126 U/L   Total Bilirubin 2.0 (H) 0.3 - 1.2 mg/dL   GFR calc non Af Amer 53 (L) >60 mL/min   GFR calc Af Amer >60 >60 mL/min    Comment: (NOTE) The eGFR has been calculated using the CKD EPI equation. This calculation has not been validated in all clinical situations. eGFR's persistently <60 mL/min signify possible Chronic Kidney Disease.    Anion gap 4 (L) 5 - 15  CBC     Status: Abnormal   Collection Time: 04/06/17  5:21 AM  Result Value Ref Range   WBC 6.9 4.0 - 10.5 K/uL   RBC 3.83 (L) 3.87 - 5.11 MIL/uL   Hemoglobin 12.6 12.0 - 15.0 g/dL   HCT 35.9 (L) 36.0 - 46.0 %   MCV 93.7 78.0 - 100.0 fL   MCH 32.9 26.0 - 34.0 pg   MCHC 35.1 30.0 - 36.0 g/dL   RDW 13.5 11.5 - 15.5 %   Platelets 126 (L) 150 - 400 K/uL  Lipase, blood     Status: Abnormal   Collection Time: 04/06/17  5:21 AM  Result Value Ref Range   Lipase 364 (H) 11 - 51 U/L   Ct Abdomen Pelvis Wo Contrast  Result Date: 04/05/2017 CLINICAL DATA:  Unspecified abdominal pain EXAM: CT ABDOMEN AND PELVIS WITHOUT CONTRAST TECHNIQUE: Multidetector CT imaging of the abdomen and pelvis was performed following the standard protocol without IV contrast. COMPARISON:  10/10/2015 FINDINGS: Lower chest: Large hiatal hernia without superimposed inflammation or obstruction. Mild scar-like or atelectatic opacities in the lower  lungs. There are few subcentimeter pulmonary nodules in the lower lungs that were also seen 10/10/2015, consistent with benign process. Hepatobiliary: No focal liver abnormality.At least 1 gallstone is visualized. Lost low-density appearance of the distal common bile duct. No duct dilatation where discretely visible. Pancreas: Mild stranding around the pancreas. No evidence of fluid collection. Spleen: Unremarkable. Adrenals/Urinary Tract: Negative adrenals. No hydronephrosis or stone. Unremarkable bladder. Stomach/Bowel:  No obstruction. No appendicitis. Vascular/Lymphatic: No acute vascular abnormality. No mass or adenopathy. Reproductive:No pathologic findings. Other: No ascites  or pneumoperitoneum. Musculoskeletal: No acute finding. Asymmetric advanced right hip osteoarthritis. L4-5 and L5-S1 discectomy with no evident bony fusion. Biforaminal impingement at L4-5 and L5-S1. IMPRESSION: 1. Mild stranding around the pancreas consistent with pancreatitis. 2. Distal common bile duct is poorly visualized and there could be filling defect such is choledocholithiasis. 3. Cholelithiasis without cholecystitis. 4. Large hiatal hernia. Electronically Signed   By: Monte Fantasia M.D.   On: 04/05/2017 14:09      Assessment/Plan Principal Problem:   Acute gallstone pancreatitis Active Problems:   Permanent atrial fibrillation (HCC)   Essential hypertension, benign   Chronic diastolic CHF (congestive heart failure) (HCC)   Abdominal pain, acute, epigastric   Choledocholithiasis with obstruction   Gallstone pancreatitis - clear liquids - will wait for lipase and symptoms to improve before considering surgery - pt may need cardiac clearance prior to surgery  - we will follow  Thank you for the consult.  Kalman Drape, [facility] Surgery 04/06/2017, 1:03 PM Pager: [phone] Consults: [phone] Mon-Fri 7:00 am-4:30 pm Sat-Sun 7:00 am-11:30 am

## 2017-04-06 NOTE — Consult Note (Signed)
[facility] Gastroenterology Consultation Note  Referring Provider: Dr. Dessa Phi [facility]) Primary Care Physician:  Leeroy Cha, MD  Reason for Consultation:  pancreatitis  HPI: Charlotte Castro is a 83 y.o. female admitted for 2-day history of epigastric burning discomfort.  No antecedent abdominal pain history.  Some vomiting.  No jaundice or fevers.  No blood in stool or loss of appetite.  Imaging and labs consistent with pancreatitis; LFTs elevated but downtrending.  Pain improved, but not resolved, since admission.   Past Medical History:  Diagnosis Date  . Anxiety   . Arthritis    "left knee" (04/05/2017)  . Atrial fibrillation ([place])   . Atrial flutter ([place])    typical appearing  . Atrial tachycardia ([place])    ablated 11/17/10  by JA  from the [facility] of the aorta  . Chronic lower back pain   . Dizziness    chronic and of an unclear etiology  . Gastritis   . GERD (gastroesophageal reflux disease)   . History of blood transfusion ~ 1948  . HTN (hypertension)   . Hyperlipemia   . Hypothyroidism   . Obesity   . Osteoporosis   . Sinus headache   . Vitamin D deficiency     Past Surgical History:  Procedure Laterality Date  . ATRIAL ABLATION SURGERY  11/17/10   Atrial tachycardia arising from [facility] of the aorta ablated by JA  . BACK SURGERY    . CARDIAC CATHETERIZATION  01/11/2011   Archie Endo 01/12/2011  . FRACTURE SURGERY    . LUMBAR SPINE SURGERY  ~ 1998  . SHOULDER SURGERY Right 1984 X2   Rollene Rotunda 01/19/2011  . WRIST FRACTURE SURGERY Left 1983    Prior to Admission medications   Medication Sig Start Date End Date Taking? Authorizing Provider  acetaminophen (TYLENOL) 325 MG tablet Take 325 mg by mouth every 4 (four) hours as needed for headache (pain). Reported on 10/04/2015   Yes [provider]  Calcium Carbonate-Vitamin D (CALCIUM-VITAMIN D) 600-200 MG-UNIT CAPS Take 1 capsule by mouth daily. Reported on 10/04/2015   Yes [provider]  Cholecalciferol  (VITAMIN D3) 1000 UNITS CAPS Take 1,000 Units by mouth daily. Reported on 10/04/2015   Yes [provider]  clonazePAM (KLONOPIN) 0.5 MG tablet Take 0.5-1 tablets (0.25-0.5 mg total) by mouth 2 (two) times daily. 04/13/16  Yes Sheikh, Omair Latif, DO  Cyanocobalamin 1000 MCG/ML KIT Inject 1,000 mcg as directed every 30 (thirty) days.   Yes [provider]  levothyroxine (SYNTHROID, LEVOTHROID) 88 MCG tablet Take 1 tablet (88 mcg total) by mouth daily before breakfast. 04/21/16  Yes Lauree Chandler, NP  metoprolol (LOPRESSOR) 50 MG tablet Take 1 tablet (50 mg total) by mouth 2 (two) times daily. 04/21/16  Yes Lauree Chandler, NP  [place]  Peripheral Neuropathy Cream- Bupivacaine 1%, Doxepin 3%, Gabapentin 6%, Pentoxifylline 3%, Topiramate 1% Apply 1-2 grams to affected area 3-4 times daily Qty. 120 gm 3 refills   Yes [provider]  ondansetron (ZOFRAN) 4 MG tablet Take 4 mg by mouth every 8 (eight) hours as needed for nausea or vomiting.   Yes [provider]  OVER THE COUNTER MEDICATION Place 1 drop into both eyes daily as needed (dry eyes). Over the counter lubricating eye drop   Yes [provider]  warfarin (COUMADIN) 5 MG tablet Take 1/2 to 1 tablet daily as directed by coumadin clinic Patient taking differently: Take 2.5-5 mg by mouth See admin instructions.  2.5 mg on Sunday-all other days 5 mg 03/09/17  Yes Jettie Booze, MD    Current Facility-Administered Medications  Medication Dose Route Frequency Provider Last Rate Last Dose  . 0.9 %  sodium chloride infusion   Intravenous Continuous Dessa Phi Chahn-Yang, DO 125 mL/hr at 04/06/17 1026    . clonazePAM (KLONOPIN) tablet 0.25-0.5 mg  0.25-0.5 mg Oral BID Derrill Kay A, MD   0.5 mg at 04/06/17 0947  . metoprolol tartrate (LOPRESSOR) tablet 50 mg  50 mg Oral BID Phillips Grout, MD   50 mg at 04/06/17 0947  . morphine 4 MG/ML injection 4 mg  4 mg  Intravenous Q2H PRN Phillips Grout, MD   4 mg at 04/06/17 8299  . ondansetron (ZOFRAN) tablet 4 mg  4 mg Oral Q6H PRN Phillips Grout, MD       Or  . ondansetron [facility]) injection 4 mg  4 mg Intravenous Q6H PRN Phillips Grout, MD   4 mg at 04/06/17 0805  . piperacillin-tazobactam (ZOSYN) IVPB 3.375 g  3.375 g Intravenous Q8H Lavenia Atlas, [facility]   Stopped at 04/06/17 3716    Allergies as of 04/05/2017 - Review Complete 04/05/2017  Allergen Reaction Noted  . Iodinated diagnostic agents Shortness Of Breath 01/27/2011  . Penicillins Other (See Comments) 05/05/2016    Family History  Problem Relation Age of Onset  . Heart attack Father   . Hypertension Father     Social History   Social History  . Marital status: Widowed    Spouse name: N/A  . Number of children: N/A  . Years of education: N/A   Occupational History  . Not on file.   Social History Main Topics  . Smoking status: Never Smoker  . Smokeless tobacco: Never Used  . Alcohol use No  . Drug use: No  . Sexual activity: No   Other Topics Concern  . Not on file   Social History Narrative  . No narrative on file    Review of Systems: As per HPI, all others negative  Physical Exam: Vital signs in last 24 hours: Temp:  [98.6 F (37 C)-99.1 F (37.3 C)] 98.6 F (37 C) (10/23 2147) Pulse Rate:  [75-108] 75 (10/24 0947) Resp:  [13-25] 16 (10/23 2147) BP: (135-176)/(60-99) 135/60 (10/24 0947) SpO2:  [96 %-100 %] 99 % (10/23 2147) Weight:  [217 lb 9.6 oz (98.7 kg)] 217 lb 9.6 oz (98.7 kg) (10/24 0600) Last BM Date: 04/05/17 General:   Alert, obese, Well-developed, well-nourished, pleasant and cooperative in NAD Head:  Normocephalic and atraumatic. Eyes:  Sclera clear, no icterus.   Conjunctiva pink. Ears:  Normal auditory acuity. Nose:  No deformity, discharge,  or lesions. Mouth:  No deformity or lesions.  Oropharynx pink & moist. Neck:  Thick but supple; no masses or thyromegaly. Abdomen:  Soft,  obese, protuberant, very mild epigastric tenderness and nondistended. No masses, hepatosplenomegaly or hernias noted. Normal bowel sounds, without guarding, and without rebound.     Msk:  Symmetrical without gross deformities. Normal posture. Pulses:  Normal pulses noted. Extremities:  Without clubbing or edema. Neurologic:  Alert and  oriented x4; diffusely weak, otherwise grossly normal neurologically. Skin:  Scattered ecchymoses, otherwise intact without significant lesions or rashes. Psych:  Alert and cooperative. Normal mood and affect.   Lab Results:  Recent Labs  04/05/17 1010 04/06/17 0521  WBC 15.1* 6.9  HGB 15.5* 12.6  HCT 44.4 35.9*  PLT 180 126*  BMET  Recent Labs  04/05/17 1010 04/06/17 0521  NA 140 140  K 4.5 3.8  CL 106 111  CO2 23 25  GLUCOSE 133* 82  BUN 19 12  CREATININE 0.98 0.97  CALCIUM 10.0 8.3*   LFT  Recent Labs  04/06/17 0521  PROT 5.4*  ALBUMIN 2.8*  AST 129*  ALT 123*  ALKPHOS 78  BILITOT 2.0*   PT/INR  Recent Labs  04/05/17 1743  LABPROT 23.2*  INR 2.07    Studies/Results: Ct Abdomen Pelvis Wo Contrast  Result Date: 04/05/2017 CLINICAL DATA:  Unspecified abdominal pain EXAM: CT ABDOMEN AND PELVIS WITHOUT CONTRAST TECHNIQUE: Multidetector CT imaging of the abdomen and pelvis was performed following the standard protocol without IV contrast. COMPARISON:  10/10/2015 FINDINGS: Lower chest: Large hiatal hernia without superimposed inflammation or obstruction. Mild scar-like or atelectatic opacities in the lower lungs. There are few subcentimeter pulmonary nodules in the lower lungs that were also seen 10/10/2015, consistent with benign process. Hepatobiliary: No focal liver abnormality.At least 1 gallstone is visualized. Lost low-density appearance of the distal common bile duct. No duct dilatation where discretely visible. Pancreas: Mild stranding around the pancreas. No evidence of fluid collection. Spleen: Unremarkable.  Adrenals/Urinary Tract: Negative adrenals. No hydronephrosis or stone. Unremarkable bladder. Stomach/Bowel:  No obstruction. No appendicitis. Vascular/Lymphatic: No acute vascular abnormality. No mass or adenopathy. Reproductive:No pathologic findings. Other: No ascites or pneumoperitoneum. Musculoskeletal: No acute finding. Asymmetric advanced right hip osteoarthritis. L4-5 and L5-S1 discectomy with no evident bony fusion. Biforaminal impingement at L4-5 and L5-S1. IMPRESSION: 1. Mild stranding around the pancreas consistent with pancreatitis. 2. Distal common bile duct is poorly visualized and there could be filling defect such is choledocholithiasis. 3. Cholelithiasis without cholecystitis. 4. Large hiatal hernia. Electronically Signed   By: Monte Fantasia M.D.   On: 04/05/2017 14:09   Impression:  1.  Elevated LFTs, downtrending. 2.  Pancreatitis, suspect gallstone-mediated.  There is no compelling evidence of cholangitis at the present time. 3.  Epigastric abdominal pain, ongoing. 4.  Abnormal CT scan, pancreatitis + large hiatal hernia + poor visualization of CBD. 5.  Atrial fibrillation, chronic anticoagulation.  Plan:  1.  Clear liquid diet. 2.  Follow LFTs.  3.  Please obtain surgical consultation. 4.  Supportive management of pancreatitis (IV fluids, judicious analgesics). 5.  If LFTs continue to downtrend, would recommend consideration of cholecystectomy + IOC during this admission; if LFTs don't continue to downtrend over the next 24-48 hours, would consider pre-operative MRCP. 6.  Hold oral anticoagulation in anticipation of possible procedures/surgery this admission. 7.  [facility] will follow.   LOS: 1 day   Jerel Sardina M  04/06/2017, 11:08 AM  Cell [phone] If no answer or after 5 PM call [phone]

## 2017-04-06 NOTE — Progress Notes (Signed)
PROGRESS NOTE    Charlotte Castro  OZH:[id number] DOB: 10-17-1932 DOA: 04/05/2017 PCP: Leeroy Cha, MD     Brief Narrative:  Charlotte Castro is a 83 y.o. female with medical history significant of A Fib, HTN, GERD who presents with nausea, vomiting, epigastric abdominal pain. Work up as revealed acute gallstone pancreatitis with lipase > 3000. She was admitted for further evaluation and treatment.  Assessment & Plan:   Principal Problem:   Acute gallstone pancreatitis Active Problems:   Permanent atrial fibrillation (HCC)   Essential hypertension, benign   Chronic diastolic CHF (congestive heart failure) (HCC)   Abdominal pain, acute, epigastric   Choledocholithiasis with obstruction   Acute gallstone pancreatitis with choledocholithiasis  -GI and general surgery consulted -Trend lipase and LFT  -IVF -No sign of intra-abdominal infection, stop zosyn   Permanent A Fib -Lopressor -Hold coumadin   Essential HTN -Lopressor   Chronic diastolic HF  -Euvolemic on exam    DVT prophylaxis: SCD Code Status: Full Family Communication: No family at bedside Disposition Plan: Pending improvement    Consultants:   GI  General surgery  Procedures:   None   Antimicrobials:  Anti-infectives    Start     Dose/Rate Route Frequency Ordered Stop   04/05/17 2000  piperacillin-tazobactam (ZOSYN) IVPB 3.375 g  Status:  Discontinued     3.375 g 12.5 mL/hr over 240 Minutes Intravenous Every 8 hours 04/05/17 1220 04/06/17 1418   04/05/17 1215  piperacillin-tazobactam (ZOSYN) IVPB 3.375 g     3.375 g 100 mL/hr over 30 Minutes Intravenous  Once 04/05/17 1216 04/05/17 1447   04/05/17 1145  piperacillin-tazobactam (ZOSYN) IVPB 4.5 g  Status:  Discontinued     4.5 g 200 mL/hr over 30 Minutes Intravenous  Once 04/05/17 1137 04/05/17 1216       Subjective: Patient states that her abdominal pain improved overnight, worsened this morning again. She describes it as an  epigastric sharp pain. She also admits to some nausea and vomiting as well. Denies any fevers at home, no chest pain or shortness of breath.  Objective: Vitals:   04/05/17 1744 04/05/17 2147 04/06/17 0600 04/06/17 0947  BP: (!) 174/82 (!) 159/65  135/60  Pulse: 88   75  Resp: 16 16    Temp: 99.1 F (37.3 C) 98.6 F (37 C)    TempSrc: Oral Oral    SpO2: 99% 99%    Weight:   98.7 kg (217 lb 9.6 oz)   Height:   5\' 8"  (1.727 m)     Intake/Output Summary (Last 24 hours) at 04/06/17 1420 Last data filed at 04/06/17 0600  Gross per 24 hour  Intake           1887.5 ml  Output              650 ml  Net           1237.5 ml   Filed Weights   04/06/17 0600  Weight: 98.7 kg (217 lb 9.6 oz)    Examination:  General exam: Appears calm and comfortable  Respiratory system: Clear to auscultation. Respiratory effort normal. Cardiovascular system: S1 & S2 heard. No JVD, murmurs, rubs, gallops or clicks. No pedal edema. Gastrointestinal system: Abdomen is nondistended, soft and TTP epigastric. No organomegaly or masses felt. Normal bowel sounds heard. Central nervous system: Alert and oriented. No focal neurological deficits. Extremities: Symmetric 5 x 5 power. Skin: No rashes, lesions or ulcers Psychiatry: Judgement and insight appear normal.  Mood & affect appropriate.   Data Reviewed: I have personally reviewed following labs and imaging studies  CBC:  Recent Labs Lab 04/05/17 1010 04/06/17 0521  WBC 15.1* 6.9  HGB 15.5* 12.6  HCT 44.4 35.9*  MCV 92.9 93.7  PLT 180 628*   Basic Metabolic Panel:  Recent Labs Lab 04/05/17 1010 04/06/17 0521  NA 140 140  K 4.5 3.8  CL 106 111  CO2 23 25  GLUCOSE 133* 82  BUN 19 12  CREATININE 0.98 0.97  CALCIUM 10.0 8.3*   GFR: Estimated Creatinine Clearance: 54.9 mL/min (by C-G formula based on SCr of 0.97 mg/dL). Liver Function Tests:  Recent Labs Lab 04/05/17 1010 04/06/17 0521  AST 308* 129*  ALT 140* 123*  ALKPHOS 98 78    BILITOT 2.7* 2.0*  PROT 7.0 5.4*  ALBUMIN 4.1 2.8*    Recent Labs Lab 04/05/17 1010 04/06/17 0521  LIPASE 3,514* 364*   No results for input(s): AMMONIA in the last 168 hours. Coagulation Profile:  Recent Labs Lab 04/05/17 1743  INR 2.07   Cardiac Enzymes: No results for input(s): CKTOTAL, CKMB, CKMBINDEX, TROPONINI in the last 168 hours. BNP (last 3 results) No results for input(s): PROBNP in the last 8760 hours. HbA1C: No results for input(s): HGBA1C in the last 72 hours. CBG: No results for input(s): GLUCAP in the last 168 hours. Lipid Profile: No results for input(s): CHOL, HDL, LDLCALC, TRIG, CHOLHDL, LDLDIRECT in the last 72 hours. Thyroid Function Tests: No results for input(s): TSH, T4TOTAL, FREET4, T3FREE, THYROIDAB in the last 72 hours. Anemia Panel: No results for input(s): VITAMINB12, FOLATE, FERRITIN, TIBC, IRON, RETICCTPCT in the last 72 hours. Sepsis Labs:  Recent Labs Lab 04/05/17 1019 04/05/17 1233 04/05/17 1743 04/05/17 2025  LATICACIDVEN 2.51* 3.18* 1.3 0.9    Recent Results (from the past 240 hour(s))  Blood Culture (routine x 2)     Status: None (Preliminary result)   Collection Time: 04/05/17 11:34 AM  Result Value Ref Range Status   Specimen Description BLOOD RIGHT ANTECUBITAL  Final   Special Requests   Final    BOTTLES DRAWN AEROBIC AND ANAEROBIC Blood Culture results may not be optimal due to an excessive volume of blood received in culture bottles   Culture NO GROWTH < 24 HOURS  Final   Report Status PENDING  Incomplete  Blood Culture (routine x 2)     Status: None (Preliminary result)   Collection Time: 04/05/17 11:51 AM  Result Value Ref Range Status   Specimen Description BLOOD LEFT FOREARM  Final   Special Requests   Final    BOTTLES DRAWN AEROBIC AND ANAEROBIC Blood Culture adequate volume   Culture NO GROWTH < 24 HOURS  Final   Report Status PENDING  Incomplete       Radiology Studies: Ct Abdomen Pelvis Wo  Contrast  Result Date: 04/05/2017 CLINICAL DATA:  Unspecified abdominal pain EXAM: CT ABDOMEN AND PELVIS WITHOUT CONTRAST TECHNIQUE: Multidetector CT imaging of the abdomen and pelvis was performed following the standard protocol without IV contrast. COMPARISON:  10/10/2015 FINDINGS: Lower chest: Large hiatal hernia without superimposed inflammation or obstruction. Mild scar-like or atelectatic opacities in the lower lungs. There are few subcentimeter pulmonary nodules in the lower lungs that were also seen 10/10/2015, consistent with benign process. Hepatobiliary: No focal liver abnormality.At least 1 gallstone is visualized. Lost low-density appearance of the distal common bile duct. No duct dilatation where discretely visible. Pancreas: Mild stranding around the pancreas. No evidence of  fluid collection. Spleen: Unremarkable. Adrenals/Urinary Tract: Negative adrenals. No hydronephrosis or stone. Unremarkable bladder. Stomach/Bowel:  No obstruction. No appendicitis. Vascular/Lymphatic: No acute vascular abnormality. No mass or adenopathy. Reproductive:No pathologic findings. Other: No ascites or pneumoperitoneum. Musculoskeletal: No acute finding. Asymmetric advanced right hip osteoarthritis. L4-5 and L5-S1 discectomy with no evident bony fusion. Biforaminal impingement at L4-5 and L5-S1. IMPRESSION: 1. Mild stranding around the pancreas consistent with pancreatitis. 2. Distal common bile duct is poorly visualized and there could be filling defect such is choledocholithiasis. 3. Cholelithiasis without cholecystitis. 4. Large hiatal hernia. Electronically Signed   By: Monte Fantasia M.D.   On: 04/05/2017 14:09      Scheduled Meds: . clonazePAM  0.25-0.5 mg Oral BID  . metoprolol tartrate  50 mg Oral BID  . multivitamin with minerals  1 tablet Oral Daily   Continuous Infusions: . sodium chloride 125 mL/hr at 04/06/17 1026     LOS: 1 day    Time spent: 40 minutes   Dessa Phi, DO [facility] [email or web address] Password [email or web address] 04/06/2017, 2:20 PM

## 2017-04-06 NOTE — Progress Notes (Signed)
Initial Nutrition Assessment  DOCUMENTATION CODES:   Obesity unspecified  INTERVENTION:   -MVI daily -RD will follow for diet advancement and supplement as appropriate  NUTRITION DIAGNOSIS:   Inadequate oral intake related to altered GI function as evidenced by NPO status.  GOAL:   Patient will meet greater than or equal to 90% of their needs  MONITOR:   PO intake, Diet advancement, Labs, Weight trends, Skin, I & O's  REASON FOR ASSESSMENT:   Malnutrition Screening Tool    ASSESSMENT:   Charlotte Castro is a 83 y.o. female with medical history significant of afib, htn, GERD comes in with nausea and vomiting associated with epigastric pain going to the ruq with sudden onset last night.  No fevers.  No diarrhea.  Still has gallbladder.  Worse after trying to eat something.  Found to have acute gallstone pancreatitis and referred for admission for such.  Her pain is much improved since arrival to the ED.  Pt admitted with acute gallstone pancreatitis.   Spoke with pt, who reports poor appetite over the past 3 days, due to stomach pain. She typically consumes 3 meals per day at home (consists of soup, pudding, jello, and chopped meat). Pt reports she does not currently feel hungry due to not feeling well.   Pt estimated that she has lost at least 30# over the past 6 months, due to multiple hospitalizations. However, this is not consistent with wt hx, which has been stable for over the past year.   Nutrition-Focused physical exam completed. Findings are no fat depletion, no muscle depletion, and no edema.   Discussed with pt potential for diet advancement and importance of good meal and supplement intake to promote healing.   Labs reviewed.   Diet Order:  Diet NPO time specified Except for: Sips with Meds  Skin:  Wound (see comment) (non pressure wound rt toe)  Last BM:  04/05/17  Height:   Ht Readings from Last 1 Encounters:  04/06/17 5\' 8"  (1.727 m)    Weight:    Wt Readings from Last 1 Encounters:  04/06/17 217 lb 9.6 oz (98.7 kg)    Ideal Body Weight:  67.3 kg  BMI:  Body mass index is 33.09 kg/m.  Estimated Nutritional Needs:   Kcal:  1572-6203  Protein:  85-100 grams  Fluid:  1.7-1.9 L  EDUCATION NEEDS:   Education needs addressed  Perry Molla A. Jimmye Norman, RD, LDN, CDE Pager: [phone] After hours Pager: [phone]

## 2017-04-07 DIAGNOSIS — I5032 Chronic diastolic (congestive) heart failure: Secondary | ICD-10-CM

## 2017-04-07 DIAGNOSIS — I481 Persistent atrial fibrillation: Secondary | ICD-10-CM

## 2017-04-07 DIAGNOSIS — K851 Biliary acute pancreatitis without necrosis or infection: Principal | ICD-10-CM

## 2017-04-07 LAB — HEPATIC FUNCTION PANEL
ALK PHOS: 78 U/L (ref 38–126)
ALT: 93 U/L — AB (ref 14–54)
AST: 72 U/L — AB (ref 15–41)
Albumin: 3.2 g/dL — ABNORMAL LOW (ref 3.5–5.0)
BILIRUBIN DIRECT: 0.4 mg/dL (ref 0.1–0.5)
BILIRUBIN TOTAL: 1.4 mg/dL — AB (ref 0.3–1.2)
Indirect Bilirubin: 1 mg/dL — ABNORMAL HIGH (ref 0.3–0.9)
Total Protein: 6.1 g/dL — ABNORMAL LOW (ref 6.5–8.1)

## 2017-04-07 LAB — CBC WITH DIFFERENTIAL/PLATELET
BASOS ABS: 0 10*3/uL (ref 0.0–0.1)
Basophils Relative: 0 %
Eosinophils Absolute: 0.1 10*3/uL (ref 0.0–0.7)
Eosinophils Relative: 1 %
HEMATOCRIT: 39.6 % (ref 36.0–46.0)
Hemoglobin: 13.7 g/dL (ref 12.0–15.0)
LYMPHS ABS: 1.2 10*3/uL (ref 0.7–4.0)
LYMPHS PCT: 13 %
MCH: 32.2 pg (ref 26.0–34.0)
MCHC: 34.6 g/dL (ref 30.0–36.0)
MCV: 93.2 fL (ref 78.0–100.0)
Monocytes Absolute: 0.9 10*3/uL (ref 0.1–1.0)
Monocytes Relative: 10 %
NEUTROS ABS: 7.1 10*3/uL (ref 1.7–7.7)
Neutrophils Relative %: 76 %
Platelets: 127 10*3/uL — ABNORMAL LOW (ref 150–400)
RBC: 4.25 MIL/uL (ref 3.87–5.11)
RDW: 13.4 % (ref 11.5–15.5)
WBC: 9.3 10*3/uL (ref 4.0–10.5)

## 2017-04-07 LAB — BASIC METABOLIC PANEL
ANION GAP: 8 (ref 5–15)
BUN: 8 mg/dL (ref 6–20)
CHLORIDE: 108 mmol/L (ref 101–111)
CO2: 25 mmol/L (ref 22–32)
Calcium: 8.7 mg/dL — ABNORMAL LOW (ref 8.9–10.3)
Creatinine, Ser: 0.8 mg/dL (ref 0.44–1.00)
GFR calc Af Amer: >60 mL/min (ref >60)
GFR calc non Af Amer: >60 mL/min (ref >60)
GLUCOSE: 95 mg/dL (ref 65–99)
POTASSIUM: 3.8 mmol/L (ref 3.5–5.1)
Sodium: 141 mmol/L (ref 135–145)

## 2017-04-07 LAB — PROTIME-INR
INR: 1.98
Prothrombin Time: 22.4 seconds — ABNORMAL HIGH (ref 11.4–15.2)

## 2017-04-07 LAB — LIPASE, BLOOD: LIPASE: 76 U/L — AB (ref 11–51)

## 2017-04-07 MED ORDER — ACETAMINOPHEN 325 MG PO TABS
650.0000 mg | ORAL_TABLET | Freq: Four times a day (QID) | ORAL | Status: DC
  Administered 2017-04-07 – 2017-04-12 (×15): 650 mg via ORAL
  Filled 2017-04-07 (×18): qty 2

## 2017-04-07 MED ORDER — BOOST / RESOURCE BREEZE PO LIQD
1.0000 | Freq: Three times a day (TID) | ORAL | Status: DC
  Administered 2017-04-07 – 2017-04-12 (×9): 1 via ORAL

## 2017-04-07 MED ORDER — ACETAMINOPHEN 325 MG PO TABS
650.0000 mg | ORAL_TABLET | Freq: Once | ORAL | Status: AC
  Administered 2017-04-07: 650 mg via ORAL
  Filled 2017-04-07: qty 2

## 2017-04-07 MED ORDER — CIPROFLOXACIN IN D5W 400 MG/200ML IV SOLN
400.0000 mg | INTRAVENOUS | Status: AC
  Administered 2017-04-08: 400 mg via INTRAVENOUS
  Filled 2017-04-07: qty 200

## 2017-04-07 MED ORDER — TRAMADOL HCL 50 MG PO TABS: 50.0000 mg | ORAL_TABLET | Freq: Three times a day (TID) | ORAL | Status: DC | PRN

## 2017-04-07 NOTE — Progress Notes (Signed)
[facility] Progress Note     Subjective: CC:  Patients pain is improving but she reports persistent, intermittent epigastric pain that is worse at night. Also reports bloating. She is very eager to have surgery or "do something", mostly because she feels her loved ones are taking care of all of her responsibilities at home and are taking time away from their jobs to be with her. She denies nausea or vomiting. She denies BM today or yesterday.   Afebrile, VSS, No leukocytosis Lipase 76, INR 1.98  Objective: Vital signs in last 24 hours: Temp:  [98.4 F (36.9 C)-98.8 F (37.1 C)] 98.8 F (37.1 C) (10/25 0306) Pulse Rate:  [75-85] 83 (10/25 0306) Resp:  [18-20] 18 (10/25 0306) BP: (135-160)/(60-75) 151/75 (10/25 0306) SpO2:  [98 %-100 %] 100 % (10/25 0306) Weight:  [98.4 kg (217 lb)] 98.4 kg (217 lb) (10/25 0301) Last BM Date: 04/05/17  Intake/Output from previous day: 10/24 0701 - 10/25 0700 In: 1333.3 [I.V.:1333.3] Out: 500 [Urine:500] Intake/Output this shift: No intake/output data recorded.  PE: Gen:  Alert, NAD, pleasant Card:  Irregular rate, regular rhythm; pedal pulses palpable  Pulm:  Normal effort, clear to auscultation bilaterally Abd: Soft, mild TTP epigastrium without rebound or guarding, non-distended, bowel sounds present in all 4 quadrants Skin: warm and dry, no rashes  Psych: A&Ox3   Lab Results:   Recent Labs  04/06/17 0521 04/07/17 0448  WBC 6.9 9.3  HGB 12.6 13.7  HCT 35.9* 39.6  PLT 126* 127*   BMET  Recent Labs  04/06/17 0521 04/07/17 0448  NA 140 141  K 3.8 3.8  CL 111 108  CO2 25 25  GLUCOSE 82 95  BUN 12 8  CREATININE 0.97 0.80  CALCIUM 8.3* 8.7*   PT/INR  Recent Labs  04/05/17 1743 04/07/17 0448  LABPROT 23.2* 22.4*  INR 2.07 1.98   CMP     Component Value Date/Time   NA 141 04/07/2017 0448   K 3.8 04/07/2017 0448   CL 108 04/07/2017 0448   CO2 25 04/07/2017 0448   GLUCOSE 95 04/07/2017 0448   BUN 8  04/07/2017 0448   CREATININE 0.80 04/07/2017 0448   CALCIUM 8.7 (L) 04/07/2017 0448   PROT 6.1 (L) 04/07/2017 0448   ALBUMIN 3.2 (L) 04/07/2017 0448   AST 72 (H) 04/07/2017 0448   ALT 93 (H) 04/07/2017 0448   ALKPHOS 78 04/07/2017 0448   BILITOT 1.4 (H) 04/07/2017 0448   GFRNONAA >60 04/07/2017 0448   GFRAA >60 04/07/2017 0448   Lipase     Component Value Date/Time   LIPASE 76 (H) 04/07/2017 0448       Studies/Results: Ct Abdomen Pelvis Wo Contrast  Result Date: 04/05/2017 CLINICAL DATA:  Unspecified abdominal pain EXAM: CT ABDOMEN AND PELVIS WITHOUT CONTRAST TECHNIQUE: Multidetector CT imaging of the abdomen and pelvis was performed following the standard protocol without IV contrast. COMPARISON:  10/10/2015 FINDINGS: Lower chest: Large hiatal hernia without superimposed inflammation or obstruction. Mild scar-like or atelectatic opacities in the lower lungs. There are few subcentimeter pulmonary nodules in the lower lungs that were also seen 10/10/2015, consistent with benign process. Hepatobiliary: No focal liver abnormality.At least 1 gallstone is visualized. Lost low-density appearance of the distal common bile duct. No duct dilatation where discretely visible. Pancreas: Mild stranding around the pancreas. No evidence of fluid collection. Spleen: Unremarkable. Adrenals/Urinary Tract: Negative adrenals. No hydronephrosis or stone. Unremarkable bladder. Stomach/Bowel:  No obstruction. No appendicitis. Vascular/Lymphatic: No acute vascular abnormality.  No mass or adenopathy. Reproductive:No pathologic findings. Other: No ascites or pneumoperitoneum. Musculoskeletal: No acute finding. Asymmetric advanced right hip osteoarthritis. L4-5 and L5-S1 discectomy with no evident bony fusion. Biforaminal impingement at L4-5 and L5-S1. IMPRESSION: 1. Mild stranding around the pancreas consistent with pancreatitis. 2. Distal common bile duct is poorly visualized and there could be filling defect such  is choledocholithiasis. 3. Cholelithiasis without cholecystitis. 4. Large hiatal hernia. Electronically Signed   By: Monte Fantasia M.D.   On: 04/05/2017 14:09    Anti-infectives: Anti-infectives    Start     Dose/Rate Route Frequency Ordered Stop   04/05/17 2000  piperacillin-tazobactam (ZOSYN) IVPB 3.375 g  Status:  Discontinued     3.375 g 12.5 mL/hr over 240 Minutes Intravenous Every 8 hours 04/05/17 1220 04/06/17 1418   04/05/17 1215  piperacillin-tazobactam (ZOSYN) IVPB 3.375 g     3.375 g 100 mL/hr over 30 Minutes Intravenous  Once 04/05/17 1216 04/05/17 1447   04/05/17 1145  piperacillin-tazobactam (ZOSYN) IVPB 4.5 g  Status:  Discontinued     4.5 g 200 mL/hr over 30 Minutes Intravenous  Once 04/05/17 1137 04/05/17 1216     Assessment/Plan Gallstone pancreatitis  - afebrile, VSS - lipase trending down (76) and abdominal tenderness improving - PMH a.fib on coumadin which has been held. INR remains 1.98 today, would like this to be <1.5 prior to surgery. Will call cardiology today for pre-op clearance. - tentatively plan for laparoscopic cholecystectomy with possible IOC tomorrow by Dr. Georganna Skeans (Iodine and PCN allergies noted).   - clear liquid diet ok, NPO after MN   FEN: clear liquids ID: none; Cipro on call to OR due to PCN allergy VTE: SCD's  Foley: wicking cath in place       LOS: 2 days    Jill Alexanders , [facility] 04/07/2017, 8:01 AM Pager: [phone] Consults: [phone] Mon-Fri 7:00 am-4:30 pm Sat-Sun 7:00 am-11:30 am

## 2017-04-07 NOTE — Progress Notes (Signed)
Brief Nutrition Follow-Up Note  Chart reviewed. RD evaluated on 04/06/17; see note for further details. Per surgery notes, plan for lap chole with IOC on 04/08/17 if INR less than 1.5. Pt advanced to a clear liquid diet. Will add Boost Breeze po TID, each supplement provides 250 kcal and 9 grams of protein.   RD will continue to follow.   Ashtyn Freilich A. Jimmye Norman, RD, LDN, CDE Pager: [phone] After hours Pager: [phone]

## 2017-04-07 NOTE — Progress Notes (Signed)
Pt does not want to sign informed consent until her son has read it. Unsigned informed consent left at bedside. Will continue to assess.

## 2017-04-07 NOTE — Consult Note (Signed)
Cardiology Consultation:   Patient ID: BERKELEY VELDMAN; [id number]; 07/26/1932   Admit date: 04/05/2017 Date of Consult: 04/07/2017  Primary Care Provider: Leeroy Cha, MD Primary Cardiologist: Dr Irish Lack Primary Electrophysiologist: Dr. Rayann Heman   Patient Profile:   Charlotte Castro is a 83 y.o. female with a hx of atrial fibrillation, atrial flutter, hypertension, hyperlipidemia, hypothyroidism, obesity and GERD who is being seen today for the evaluation of presurgical risk assessment at the request of Dr. Donnie Mesa.  History of Present Illness:   Charlotte Castro presented to the hospital 10/23 with nausea and vomiting.  She also reported right upper quadrant epigastric pain. Her symptoms began the day prior to presentation.  She was found to have acute gallstone pancreatitis.  Cardiology was consulted for presurgical risk assessment.  She reports that at baseline she does all of her ADLs.  She does all of her indoor housework and her son does her yard work.  She has no chest pain with exertion.  She does sometimes get short of breath with her chores but this improves with rest.  She has chronic lower extremity edema that she attributes to standing in all of her jobs throughout her life.  She denies any orthopnea or PND.  She is unable to walk upstairs due to knee pain.  She does not get any regular exercise.  However she is able to sweep her floors and mop without discomfort.    Charlotte Castro underwent left heart catheterization 01/11/2011 that revealed a very small mid to distal LAD with possible spasm.  LVEDP was mildly elevated at 22 mmHg.  She otherwise had no evidence of atherosclerosis.  She had an episode of syncope in 2014.  She last saw Dr. Irish Lack 09/2016 and was stable.    Past Medical History:  Diagnosis Date  . Anxiety   . Arthritis    "left knee" (04/05/2017)  . Atrial fibrillation ([place])   . Atrial flutter ([place])    typical appearing  . Atrial tachycardia ([place])    ablated 11/17/10  by JA  from [facility] of the aorta  . Chronic lower back pain   . Dizziness    chronic and of an unclear etiology  . Gastritis   . GERD (gastroesophageal reflux disease)   . History of blood transfusion ~ 1948  . HTN (hypertension)   . Hyperlipemia   . Hypothyroidism   . Obesity   . Osteoporosis   . Sinus headache   . Vitamin D deficiency     Past Surgical History:  Procedure Laterality Date  . ATRIAL ABLATION SURGERY  11/17/10   Atrial tachycardia arising from [facility] of the aorta ablated by JA  . BACK SURGERY    . CARDIAC CATHETERIZATION  01/11/2011   Archie Endo 01/12/2011  . FRACTURE SURGERY    . LUMBAR SPINE SURGERY  ~ 1998  . SHOULDER SURGERY Right 1984 X2   Rollene Rotunda 01/19/2011  . WRIST FRACTURE SURGERY Left 1983     Home Medications:  Prior to Admission medications   Medication Sig Start Date End Date Taking? Authorizing Provider  acetaminophen (TYLENOL) 325 MG tablet Take 325 mg by mouth every 4 (four) hours as needed for headache (pain). Reported on 10/04/2015   Yes [provider]  Calcium Carbonate-Vitamin D (CALCIUM-VITAMIN D) 600-200 MG-UNIT CAPS Take 1 capsule by mouth daily. Reported on 10/04/2015   Yes [provider]  Cholecalciferol (VITAMIN D3) 1000 UNITS CAPS Take 1,000 Units by mouth daily. Reported on 10/04/2015  Yes [provider]  clonazePAM (KLONOPIN) 0.5 MG tablet Take 0.5-1 tablets (0.25-0.5 mg total) by mouth 2 (two) times daily. 04/13/16  Yes Sheikh, Omair Latif, DO  Cyanocobalamin 1000 MCG/ML KIT Inject 1,000 mcg as directed every 30 (thirty) days.   Yes [provider]  levothyroxine (SYNTHROID, LEVOTHROID) 88 MCG tablet Take 1 tablet (88 mcg total) by mouth daily before breakfast. 04/21/16  Yes Lauree Chandler, NP  metoprolol (LOPRESSOR) 50 MG tablet Take 1 tablet (50 mg total) by mouth 2 (two) times daily. 04/21/16  Yes Lauree Chandler, NP  [facility]  Peripheral Neuropathy  Cream- Bupivacaine 1%, Doxepin 3%, Gabapentin 6%, Pentoxifylline 3%, Topiramate 1% Apply 1-2 grams to affected area 3-4 times daily Qty. 120 gm 3 refills   Yes [provider]  ondansetron (ZOFRAN) 4 MG tablet Take 4 mg by mouth every 8 (eight) hours as needed for nausea or vomiting.   Yes [provider]  OVER THE COUNTER MEDICATION Place 1 drop into both eyes daily as needed (dry eyes). Over the counter lubricating eye drop   Yes [provider]  warfarin (COUMADIN) 5 MG tablet Take 1/2 to 1 tablet daily as directed by coumadin clinic Patient taking differently: Take 2.5-5 mg by mouth See admin instructions. 2.5 mg on Sunday-all other days 5 mg 03/09/17  Yes Jettie Booze, MD    Inpatient Medications: Scheduled Meds: . acetaminophen  650 mg Oral Q6H  . clonazePAM  0.25-0.5 mg Oral BID  . feeding supplement  1 Container Oral TID BM  . metoprolol tartrate  50 mg Oral BID  . multivitamin with minerals  1 tablet Oral Daily   Continuous Infusions: . sodium chloride 75 mL/hr at 04/07/17 1230  . [START ON 04/08/2017] ciprofloxacin     PRN Meds: morphine injection, ondansetron **OR** ondansetron (ZOFRAN) IV, traMADol  Allergies:    Allergies  Allergen Reactions  . Iodinated Diagnostic Agents Shortness Of Breath    headache  . Penicillins Other (See Comments)    Tolerated Zosyn Oct 2018    Social History:   Social History   Social History  . Marital status: Widowed    Spouse name: N/A  . Number of children: N/A  . Years of education: N/A   Occupational History  . Not on file.   Social History Main Topics  . Smoking status: Never Smoker  . Smokeless tobacco: Never Used  . Alcohol use No  . Drug use: No  . Sexual activity: No   Other Topics Concern  . Not on file   Social History Narrative  . No narrative on file    Family History:    Family History  Problem Relation Age of Onset  . Heart attack Father   . Hypertension Father       ROS:  Please see the history of present illness.  ROS  A 12 point review of systems was obtained and was negative with pertinent positives in the history of present illness.  Physical Exam/Data:   Vitals:   04/07/17 0306 04/07/17 0845 04/07/17 1141 04/07/17 1435  BP: (!) 151/75 (!) 148/78 134/80 133/62  Pulse: 83 99 78 72  Resp: 18     Temp: 98.8 F (37.1 C) 98.2 F (36.8 C) 97.8 F (36.6 C) 98.3 F (36.8 C)  TempSrc: Oral Oral Oral Oral  SpO2: 100% 99% 98% 95%  Weight:      Height:        Intake/Output Summary (  Last 24 hours) at 04/07/17 1612 Last data filed at 04/07/17 0900  Gross per 24 hour  Intake           697.92 ml  Output             1400 ml  Net          -702.08 ml   Filed Weights   04/06/17 0600 04/07/17 0301  Weight: 98.7 kg (217 lb 9.6 oz) 98.4 kg (217 lb)   VS:  BP 133/62 (BP Location: Right Arm)   Pulse 72   Temp 98.3 F (36.8 C) (Oral)   Resp 18   Ht 5' 8"  (1.727 m)   Wt 98.4 kg (217 lb)   SpO2 95%   BMI 32.99 kg/m  , BMI Body mass index is 32.99 kg/m. GENERAL:  Well appearing.  No acute distress HEENT: Pupils equal round and reactive, fundi not visualized, oral mucosa unremarkable NECK:  No jugular venous distention, waveform within normal limits, carotid upstroke brisk and symmetric, no bruits, no thyromegaly LUNGS:  Clear to auscultation bilaterally.  No crackles, wheezes, or rhonchi. HEART:  Irregularly irregular.   PMI not displaced or sustained,S1 and S2 within normal limits, no S3, no S4, no clicks, no rubs, no murmurs ABD:  Flat, positive bowel sounds normal in frequency in pitch, no bruits, no rebound, no guarding, no midline pulsatile mass, no hepatomegaly, no splenomegaly EXT:  2 plus pulses throughout, 1+ lower extremity edema to mid tibia bilaterally. No cyanosis no clubbing SKIN:  No rashes no nodules NEURO:  Cranial nerves II through XII grossly intact, motor grossly intact throughout PSYCH:  Cognitively intact, oriented to  person place and time   EKG:  The EKG was personally reviewed and demonstrates:  04/05/17: Atrial fibrillation rate 131 bpm. Telemetry:  Telemetry was personally reviewed and demonstrates:  n/a  Relevant CV Studies:  LHC 01/11/11: No CAD  Laboratory Data:  Chemistry Recent Labs Lab 04/05/17 1010 04/06/17 0521 04/07/17 0448  NA 140 140 141  K 4.5 3.8 3.8  CL 106 111 108  CO2 23 25 25   GLUCOSE 133* 82 95  BUN 19 12 8   CREATININE 0.98 0.97 0.80  CALCIUM 10.0 8.3* 8.7*  GFRNONAA 52* 53* >60  GFRAA >60 >60 >60  ANIONGAP 11 4* 8     Recent Labs Lab 04/05/17 1010 04/06/17 0521 04/07/17 0448  PROT 7.0 5.4* 6.1*  ALBUMIN 4.1 2.8* 3.2*  AST 308* 129* 72*  ALT 140* 123* 93*  ALKPHOS 98 78 78  BILITOT 2.7* 2.0* 1.4*   Hematology Recent Labs Lab 04/05/17 1010 04/06/17 0521 04/07/17 0448  WBC 15.1* 6.9 9.3  RBC 4.78 3.83* 4.25  HGB 15.5* 12.6 13.7  HCT 44.4 35.9* 39.6  MCV 92.9 93.7 93.2  MCH 32.4 32.9 32.2  MCHC 34.9 35.1 34.6  RDW 13.6 13.5 13.4  PLT 180 126* 127*   Cardiac EnzymesNo results for input(s): TROPONINI in the last 168 hours. No results for input(s): TROPIPOC in the last 168 hours.  BNPNo results for input(s): BNP, PROBNP in the last 168 hours.  DDimer No results for input(s): DDIMER in the last 168 hours.  Radiology/Studies:  Ct Abdomen Pelvis Wo Contrast  Result Date: 04/05/2017 CLINICAL DATA:  Unspecified abdominal pain EXAM: CT ABDOMEN AND PELVIS WITHOUT CONTRAST TECHNIQUE: Multidetector CT imaging of the abdomen and pelvis was performed following the standard protocol without IV contrast. COMPARISON:  10/10/2015 FINDINGS: Lower chest: Large hiatal hernia without superimposed inflammation or obstruction.  Mild scar-like or atelectatic opacities in the lower lungs. There are few subcentimeter pulmonary nodules in the lower lungs that were also seen 10/10/2015, consistent with benign process. Hepatobiliary: No focal liver abnormality.At least 1  gallstone is visualized. Lost low-density appearance of the distal common bile duct. No duct dilatation where discretely visible. Pancreas: Mild stranding around the pancreas. No evidence of fluid collection. Spleen: Unremarkable. Adrenals/Urinary Tract: Negative adrenals. No hydronephrosis or stone. Unremarkable bladder. Stomach/Bowel:  No obstruction. No appendicitis. Vascular/Lymphatic: No acute vascular abnormality. No mass or adenopathy. Reproductive:No pathologic findings. Other: No ascites or pneumoperitoneum. Musculoskeletal: No acute finding. Asymmetric advanced right hip osteoarthritis. L4-5 and L5-S1 discectomy with no evident bony fusion. Biforaminal impingement at L4-5 and L5-S1. IMPRESSION: 1. Mild stranding around the pancreas consistent with pancreatitis. 2. Distal common bile duct is poorly visualized and there could be filling defect such is choledocholithiasis. 3. Cholelithiasis without cholecystitis. 4. Large hiatal hernia. Electronically Signed   By: Monte Fantasia M.D.   On: 04/05/2017 14:09    Assessment and Plan:   # Presurgical risk assessment: Charlotte Castro is scheduled to undergo laparoscopic cholecystectomy tomorrow.  She has no unstable cardiac conditions.  She is able to do housework without chest pain or significant shortness of breath.  The patient does not have any unstable cardiac conditions.  Upon evaluation today, she can achieve 4 METs or greater without anginal symptoms.  According to [facility] and AHA guidelines, she requires no further cardiac workup prior to her noncardiac surgery and should be at acceptable risk.  her NSQIP risk of peri-procedural MI or cardiac arrest is 0.9%.  Our service is available as necessary in the perioperative period.  # Persistent atrial fibrillation: Charlotte Castro is in atrial fibrillation.  Her heart rate was initially elevated on presentation.  However it has been in the 70s-80s since receiving IV fluids, antibiotics, and pain medication.   Continue metoprolol.  Warfarin on hold for surgery.    For questions or updates, please contact [place] Please consult [email or web address] for contact info under Cardiology/STEMI.   Signed, Skeet Latch, MD  04/07/2017 4:12 PM

## 2017-04-07 NOTE — Progress Notes (Signed)
[facility] Progress Note  Charlotte Castro 83 y.o. 07-07-1932  CC:  Acute gallstone pancreatitis   Subjective: Patient continues to have epigastric discomfort as well as right upper quadrant discomfort. No further vomiting.     Objective: Vital signs in last 24 hours: Vitals:   04/07/17 1141 04/07/17 1435  BP: 134/80 133/62  Pulse: 78 72  Resp:    Temp: 97.8 F (36.6 C) 98.3 F (36.8 C)  SpO2: 98% 95%    Physical Exam:  General:  Alert, cooperative, no distress, morbidly obese   Head:  Normocephalic, without obvious abnormality, atraumatic  Eyes:  , EOM's intact,   Lungs:   Clear to auscultation bilaterally, respirations unlabored  Heart:  Regular rate and rhythm, S1, S2 normal  Abdomen:   Soft, epigastric tenderness to palpation bowel sounds active all four quadrants,  no masses,           Lab Results:  Recent Labs  04/06/17 0521 04/07/17 0448  NA 140 141  K 3.8 3.8  CL 111 108  CO2 25 25  GLUCOSE 82 95  BUN 12 8  CREATININE 0.97 0.80  CALCIUM 8.3* 8.7*    Recent Labs  04/06/17 0521 04/07/17 0448  AST 129* 72*  ALT 123* 93*  ALKPHOS 78 78  BILITOT 2.0* 1.4*  PROT 5.4* 6.1*  ALBUMIN 2.8* 3.2*    Recent Labs  04/06/17 0521 04/07/17 0448  WBC 6.9 9.3  NEUTROABS  --  7.1  HGB 12.6 13.7  HCT 35.9* 39.6  MCV 93.7 93.2  PLT 126* 127*    Recent Labs  04/05/17 1743 04/07/17 0448  LABPROT 23.2* 22.4*  INR 2.07 1.98      Assessment/Plan: - Acute gallstone pancreatitis - Elevated LFTs. Trending down - Large hiatal hernia - Atrial fibrillation - anticoagulation on hold  Recommendations ------------------------ - Surgery is planning for laparoscopic cholecystectomy with possible IOC  - ERCP only if positive IOC. GI will follow   Otis Brace MD, [place] 04/07/2017, 3:00 PM  Pager [phone]  If no answer or after 5 PM call [phone]

## 2017-04-07 NOTE — Progress Notes (Signed)
PROGRESS NOTE    Charlotte Castro  GLO:[id number] DOB: 07-14-1932 DOA: 04/05/2017 PCP: Leeroy Cha, MD     Brief Narrative:  Charlotte Castro is a 83 y.o. female with medical history significant of A Fib, HTN, GERD who presents with nausea, vomiting, epigastric abdominal pain. Work up as revealed acute gallstone pancreatitis with lipase > 3000. She was admitted for further evaluation and treatment.  Assessment & Plan:   Principal Problem:   Acute gallstone pancreatitis Active Problems:   Permanent atrial fibrillation (HCC)   Essential hypertension, benign   Chronic diastolic CHF (congestive heart failure) (HCC)   Abdominal pain, acute, epigastric   Choledocholithiasis with obstruction   Acute gallstone pancreatitis with choledocholithiasis  -GI and general surgery consulted -Trend lipase and LFT  -IVF -Lap chole with IOC planned 10/26, cardiology consulted for clearance   Permanent A Fib -Lopressor -Hold coumadin. Check INR in AM   Essential HTN -Lopressor   Chronic diastolic HF  -Euvolemic on exam    DVT prophylaxis: SCD Code Status: Full Family Communication: No family at bedside, left VM for son  Disposition Plan: Pending surgical procedure    Consultants:   GI  General surgery  Procedures:   None   Antimicrobials:  Anti-infectives    Start     Dose/Rate Route Frequency Ordered Stop   04/08/17 1000  ciprofloxacin (CIPRO) IVPB 400 mg     400 mg 200 mL/hr over 60 Minutes Intravenous On call to O.R. 04/07/17 1884 04/09/17 0559   04/05/17 2000  piperacillin-tazobactam (ZOSYN) IVPB 3.375 g  Status:  Discontinued     3.375 g 12.5 mL/hr over 240 Minutes Intravenous Every 8 hours 04/05/17 1220 04/06/17 1418   04/05/17 1215  piperacillin-tazobactam (ZOSYN) IVPB 3.375 g     3.375 g 100 mL/hr over 30 Minutes Intravenous  Once 04/05/17 1216 04/05/17 1447   04/05/17 1145  piperacillin-tazobactam (ZOSYN) IVPB 4.5 g  Status:  Discontinued     4.5 g 200  mL/hr over 30 Minutes Intravenous  Once 04/05/17 1137 04/05/17 1216       Subjective: Patient states that her abdominal pain persists, had some nausea without vomiting.   Objective: Vitals:   04/07/17 0301 04/07/17 0306 04/07/17 0845 04/07/17 1141  BP:  (!) 151/75 (!) 148/78 134/80  Pulse:  83 99 78  Resp:  18    Temp:  98.8 F (37.1 C) 98.2 F (36.8 C) 97.8 F (36.6 C)  TempSrc:  Oral Oral Oral  SpO2:  100% 99% 98%  Weight: 98.4 kg (217 lb)     Height:        Intake/Output Summary (Last 24 hours) at 04/07/17 1229 Last data filed at 04/07/17 0900  Gross per 24 hour  Intake          1333.34 ml  Output             1400 ml  Net           -66.66 ml   Filed Weights   04/06/17 0600 04/07/17 0301  Weight: 98.7 kg (217 lb 9.6 oz) 98.4 kg (217 lb)    Examination:  General exam: Appears calm and comfortable  Respiratory system: Clear to auscultation. Respiratory effort normal. Cardiovascular system: S1 & S2 heard. No JVD, murmurs, rubs, gallops or clicks. No pedal edema. Gastrointestinal system: Abdomen is nondistended, soft and TTP epigastric. No organomegaly or masses felt. Normal bowel sounds heard. Central nervous system: Alert and oriented. No focal neurological deficits. Extremities: Symmetric  5 x 5 power. Skin: No rashes, lesions or ulcers Psychiatry: Judgement and insight appear normal. Mood & affect appropriate.   Data Reviewed: I have personally reviewed following labs and imaging studies  CBC:  Recent Labs Lab 04/05/17 1010 04/06/17 0521 04/07/17 0448  WBC 15.1* 6.9 9.3  NEUTROABS  --   --  7.1  HGB 15.5* 12.6 13.7  HCT 44.4 35.9* 39.6  MCV 92.9 93.7 93.2  PLT 180 126* 098*   Basic Metabolic Panel:  Recent Labs Lab 04/05/17 1010 04/06/17 0521 04/07/17 0448  NA 140 140 141  K 4.5 3.8 3.8  CL 106 111 108  CO2 23 25 25   GLUCOSE 133* 82 95  BUN 19 12 8   CREATININE 0.98 0.97 0.80  CALCIUM 10.0 8.3* 8.7*   GFR: Estimated Creatinine Clearance:  66.5 mL/min (by C-G formula based on SCr of 0.8 mg/dL). Liver Function Tests:  Recent Labs Lab 04/05/17 1010 04/06/17 0521 04/07/17 0448  AST 308* 129* 72*  ALT 140* 123* 93*  ALKPHOS 98 78 78  BILITOT 2.7* 2.0* 1.4*  PROT 7.0 5.4* 6.1*  ALBUMIN 4.1 2.8* 3.2*    Recent Labs Lab 04/05/17 1010 04/06/17 0521 04/07/17 0448  LIPASE 3,514* 364* 76*   No results for input(s): AMMONIA in the last 168 hours. Coagulation Profile:  Recent Labs Lab 04/05/17 1743 04/07/17 0448  INR 2.07 1.98   Cardiac Enzymes: No results for input(s): CKTOTAL, CKMB, CKMBINDEX, TROPONINI in the last 168 hours. BNP (last 3 results) No results for input(s): PROBNP in the last 8760 hours. HbA1C: No results for input(s): HGBA1C in the last 72 hours. CBG: No results for input(s): GLUCAP in the last 168 hours. Lipid Profile: No results for input(s): CHOL, HDL, LDLCALC, TRIG, CHOLHDL, LDLDIRECT in the last 72 hours. Thyroid Function Tests: No results for input(s): TSH, T4TOTAL, FREET4, T3FREE, THYROIDAB in the last 72 hours. Anemia Panel: No results for input(s): VITAMINB12, FOLATE, FERRITIN, TIBC, IRON, RETICCTPCT in the last 72 hours. Sepsis Labs:  Recent Labs Lab 04/05/17 1019 04/05/17 1233 04/05/17 1743 04/05/17 2025  LATICACIDVEN 2.51* 3.18* 1.3 0.9    Recent Results (from the past 240 hour(s))  Blood Culture (routine x 2)     Status: None (Preliminary result)   Collection Time: 04/05/17 11:34 AM  Result Value Ref Range Status   Specimen Description BLOOD RIGHT ANTECUBITAL  Final   Special Requests   Final    BOTTLES DRAWN AEROBIC AND ANAEROBIC Blood Culture results may not be optimal due to an excessive volume of blood received in culture bottles   Culture NO GROWTH < 24 HOURS  Final   Report Status PENDING  Incomplete  Blood Culture (routine x 2)     Status: None (Preliminary result)   Collection Time: 04/05/17 11:51 AM  Result Value Ref Range Status   Specimen Description BLOOD  LEFT FOREARM  Final   Special Requests   Final    BOTTLES DRAWN AEROBIC AND ANAEROBIC Blood Culture adequate volume   Culture NO GROWTH < 24 HOURS  Final   Report Status PENDING  Incomplete       Radiology Studies: Ct Abdomen Pelvis Wo Contrast  Result Date: 04/05/2017 CLINICAL DATA:  Unspecified abdominal pain EXAM: CT ABDOMEN AND PELVIS WITHOUT CONTRAST TECHNIQUE: Multidetector CT imaging of the abdomen and pelvis was performed following the standard protocol without IV contrast. COMPARISON:  10/10/2015 FINDINGS: Lower chest: Large hiatal hernia without superimposed inflammation or obstruction. Mild scar-like or atelectatic opacities in the  lower lungs. There are few subcentimeter pulmonary nodules in the lower lungs that were also seen 10/10/2015, consistent with benign process. Hepatobiliary: No focal liver abnormality.At least 1 gallstone is visualized. Lost low-density appearance of the distal common bile duct. No duct dilatation where discretely visible. Pancreas: Mild stranding around the pancreas. No evidence of fluid collection. Spleen: Unremarkable. Adrenals/Urinary Tract: Negative adrenals. No hydronephrosis or stone. Unremarkable bladder. Stomach/Bowel:  No obstruction. No appendicitis. Vascular/Lymphatic: No acute vascular abnormality. No mass or adenopathy. Reproductive:No pathologic findings. Other: No ascites or pneumoperitoneum. Musculoskeletal: No acute finding. Asymmetric advanced right hip osteoarthritis. L4-5 and L5-S1 discectomy with no evident bony fusion. Biforaminal impingement at L4-5 and L5-S1. IMPRESSION: 1. Mild stranding around the pancreas consistent with pancreatitis. 2. Distal common bile duct is poorly visualized and there could be filling defect such is choledocholithiasis. 3. Cholelithiasis without cholecystitis. 4. Large hiatal hernia. Electronically Signed   By: Monte Fantasia M.D.   On: 04/05/2017 14:09      Scheduled Meds: . acetaminophen  650 mg Oral Q6H    . clonazePAM  0.25-0.5 mg Oral BID  . feeding supplement  1 Container Oral TID BM  . metoprolol tartrate  50 mg Oral BID  . multivitamin with minerals  1 tablet Oral Daily   Continuous Infusions: . sodium chloride 125 mL/hr at 04/07/17 1031  . [START ON 04/08/2017] ciprofloxacin       LOS: 2 days    Time spent: 30 minutes   Dessa Phi, DO [facility] [email or web address] Password [facility] 04/07/2017, 12:29 PM

## 2017-04-08 ENCOUNTER — Inpatient Hospital Stay (HOSPITAL_COMMUNITY): Payer: Medicare Other | Admitting: Anesthesiology

## 2017-04-08 ENCOUNTER — Encounter (HOSPITAL_COMMUNITY): Payer: Self-pay | Admitting: Anesthesiology

## 2017-04-08 ENCOUNTER — Encounter (HOSPITAL_COMMUNITY)
Admission: EM | Disposition: A | Discharge status: Skilled Nursing Facility | Payer: Self-pay | Source: Home / Self Care | Attending: Internal Medicine

## 2017-04-08 HISTORY — PX: CHOLECYSTECTOMY: SHX55

## 2017-04-08 LAB — COMPREHENSIVE METABOLIC PANEL
ALBUMIN: 3 g/dL — AB (ref 3.5–5.0)
ALK PHOS: 73 U/L (ref 38–126)
ALT: 61 U/L — ABNORMAL HIGH (ref 14–54)
AST: 37 U/L (ref 15–41)
Anion gap: 7 (ref 5–15)
BILIRUBIN TOTAL: 1.6 mg/dL — AB (ref 0.3–1.2)
BUN: 6 mg/dL (ref 6–20)
CO2: 24 mmol/L (ref 22–32)
Calcium: 8.6 mg/dL — ABNORMAL LOW (ref 8.9–10.3)
Chloride: 110 mmol/L (ref 101–111)
Creatinine, Ser: 0.72 mg/dL (ref 0.44–1.00)
GFR calc Af Amer: >60 mL/min (ref >60)
GFR calc non Af Amer: >60 mL/min (ref >60)
GLUCOSE: 90 mg/dL (ref 65–99)
POTASSIUM: 3.6 mmol/L (ref 3.5–5.1)
Sodium: 141 mmol/L (ref 135–145)
TOTAL PROTEIN: 6 g/dL — AB (ref 6.5–8.1)

## 2017-04-08 LAB — CBC WITH DIFFERENTIAL/PLATELET
BASOS ABS: 0 10*3/uL (ref 0.0–0.1)
Basophils Relative: 0 %
Eosinophils Absolute: 0.2 10*3/uL (ref 0.0–0.7)
Eosinophils Relative: 2 %
HEMATOCRIT: 37.7 % (ref 36.0–46.0)
HEMOGLOBIN: 13.4 g/dL (ref 12.0–15.0)
LYMPHS PCT: 14 %
Lymphs Abs: 1.2 10*3/uL (ref 0.7–4.0)
MCH: 33.1 pg (ref 26.0–34.0)
MCHC: 35.5 g/dL (ref 30.0–36.0)
MCV: 93.1 fL (ref 78.0–100.0)
Monocytes Absolute: 0.9 10*3/uL (ref 0.1–1.0)
Monocytes Relative: 11 %
NEUTROS ABS: 6 10*3/uL (ref 1.7–7.7)
Neutrophils Relative %: 73 %
PLATELETS: 137 10*3/uL — AB (ref 150–400)
RBC: 4.05 MIL/uL (ref 3.87–5.11)
RDW: 13.2 % (ref 11.5–15.5)
WBC: 8.3 10*3/uL (ref 4.0–10.5)

## 2017-04-08 LAB — PROTIME-INR
INR: 1.9
INR: 1.62
Prothrombin Time: 21.6 seconds — ABNORMAL HIGH (ref 11.4–15.2)
Prothrombin Time: 19.1 seconds — ABNORMAL HIGH (ref 11.4–15.2)

## 2017-04-08 LAB — ABO/RH: ABO/RH(D): A POS

## 2017-04-08 LAB — SURGICAL PCR SCREEN
MRSA, PCR: NEGATIVE
STAPHYLOCOCCUS AUREUS: NEGATIVE

## 2017-04-08 LAB — LIPASE, BLOOD: Lipase: 64 U/L — ABNORMAL HIGH (ref 11–51)

## 2017-04-08 SURGERY — LAPAROSCOPIC CHOLECYSTECTOMY
Anesthesia: General | Site: Abdomen

## 2017-04-08 MED ORDER — ROCURONIUM BROMIDE 10 MG/ML (PF) SYRINGE
PREFILLED_SYRINGE | INTRAVENOUS | Status: AC
  Filled 2017-04-08: qty 10

## 2017-04-08 MED ORDER — PROPOFOL 10 MG/ML IV BOLUS
INTRAVENOUS | Status: AC
Start: 2017-04-08 — End: ?
  Filled 2017-04-08: qty 20

## 2017-04-08 MED ORDER — BUPIVACAINE-EPINEPHRINE 0.25% -1:200000 IJ SOLN
INTRAMUSCULAR | Status: DC | PRN
  Administered 2017-04-08: 12 mL

## 2017-04-08 MED ORDER — MORPHINE SULFATE (PF) 4 MG/ML IV SOLN
4.0000 mg | INTRAVENOUS | Status: DC | PRN
  Administered 2017-04-08: 4 mg via INTRAVENOUS
  Filled 2017-04-08: qty 1

## 2017-04-08 MED ORDER — SUCCINYLCHOLINE CHLORIDE 20 MG/ML IJ SOLN
INTRAMUSCULAR | Status: DC | PRN
  Administered 2017-04-08: 100 mg via INTRAVENOUS
  Administered 2017-04-08: 30 mg via INTRAVENOUS

## 2017-04-08 MED ORDER — 0.9 % SODIUM CHLORIDE (POUR BTL) OPTIME
TOPICAL | Status: DC | PRN
  Administered 2017-04-08: 1000 mL

## 2017-04-08 MED ORDER — SODIUM CHLORIDE 0.9 % IR SOLN
Status: DC | PRN
  Administered 2017-04-08: 1000 mL

## 2017-04-08 MED ORDER — PROPOFOL 10 MG/ML IV BOLUS
INTRAVENOUS | Status: AC
  Filled 2017-04-08: qty 20

## 2017-04-08 MED ORDER — LIDOCAINE 2% (20 MG/ML) 5 ML SYRINGE
INTRAMUSCULAR | Status: DC | PRN
  Administered 2017-04-08: 100 mg via INTRAVENOUS

## 2017-04-08 MED ORDER — ROCURONIUM BROMIDE 50 MG/5ML IV SOSY
PREFILLED_SYRINGE | INTRAVENOUS | Status: DC | PRN
  Administered 2017-04-08: 30 mg via INTRAVENOUS

## 2017-04-08 MED ORDER — FENTANYL CITRATE (PF) 100 MCG/2ML IJ SOLN
INTRAMUSCULAR | Status: DC | PRN
  Administered 2017-04-08 (×2): 50 ug via INTRAVENOUS

## 2017-04-08 MED ORDER — PROPOFOL 10 MG/ML IV BOLUS
INTRAVENOUS | Status: DC | PRN
Start: 2017-04-08 — End: 2017-04-08
  Administered 2017-04-08: 30 mg via INTRAVENOUS
  Administered 2017-04-08: 120 mg via INTRAVENOUS

## 2017-04-08 MED ORDER — FENTANYL CITRATE (PF) 100 MCG/2ML IJ SOLN: 25.0000 ug | INTRAMUSCULAR | Status: DC | PRN

## 2017-04-08 MED ORDER — DEXAMETHASONE SODIUM PHOSPHATE 4 MG/ML IJ SOLN
INTRAMUSCULAR | Status: DC | PRN
  Administered 2017-04-08: 4 mg via INTRAVENOUS

## 2017-04-08 MED ORDER — ONDANSETRON HCL 4 MG/2ML IJ SOLN
INTRAMUSCULAR | Status: AC
Start: 2017-04-08 — End: ?
  Filled 2017-04-08: qty 2

## 2017-04-08 MED ORDER — CIPROFLOXACIN IN D5W 400 MG/200ML IV SOLN
INTRAVENOUS | Status: AC
  Filled 2017-04-08: qty 200

## 2017-04-08 MED ORDER — FENTANYL CITRATE (PF) 250 MCG/5ML IJ SOLN
INTRAMUSCULAR | Status: AC
  Filled 2017-04-08: qty 5

## 2017-04-08 MED ORDER — DEXAMETHASONE SODIUM PHOSPHATE 10 MG/ML IJ SOLN
INTRAMUSCULAR | Status: AC
  Filled 2017-04-08: qty 1

## 2017-04-08 MED ORDER — LACTATED RINGERS IV SOLN: INTRAVENOUS | Status: DC

## 2017-04-08 MED ORDER — SUGAMMADEX SODIUM 200 MG/2ML IV SOLN
INTRAVENOUS | Status: DC | PRN
  Administered 2017-04-08: 200 mg via INTRAVENOUS

## 2017-04-08 MED ORDER — LIDOCAINE 2% (20 MG/ML) 5 ML SYRINGE
INTRAMUSCULAR | Status: AC
  Filled 2017-04-08: qty 5

## 2017-04-08 MED ORDER — SODIUM CHLORIDE 0.9 % IV SOLN: Freq: Once | INTRAVENOUS | Status: DC

## 2017-04-08 MED ORDER — BUPIVACAINE-EPINEPHRINE (PF) 0.25% -1:200000 IJ SOLN
INTRAMUSCULAR | Status: AC
  Filled 2017-04-08: qty 30

## 2017-04-08 MED ORDER — SUCCINYLCHOLINE CHLORIDE 200 MG/10ML IV SOSY
PREFILLED_SYRINGE | INTRAVENOUS | Status: AC
  Filled 2017-04-08: qty 10

## 2017-04-08 MED ORDER — VITAMIN K1 10 MG/ML IJ SOLN
10.0000 mg | Freq: Once | INTRAVENOUS | Status: AC
  Administered 2017-04-08: 10 mg via INTRAVENOUS
  Filled 2017-04-08: qty 1

## 2017-04-08 MED ORDER — DEXAMETHASONE SODIUM PHOSPHATE 10 MG/ML IJ SOLN
INTRAMUSCULAR | Status: AC
  Filled 2017-04-08: qty 3

## 2017-04-08 MED ORDER — OXYCODONE HCL 5 MG PO TABS
5.0000 mg | ORAL_TABLET | ORAL | Status: DC | PRN
  Administered 2017-04-08 – 2017-04-09 (×2): 5 mg via ORAL
  Filled 2017-04-08 (×2): qty 1

## 2017-04-08 MED ORDER — IOPAMIDOL (ISOVUE-300) INJECTION 61%
INTRAVENOUS | Status: AC
  Filled 2017-04-08: qty 50

## 2017-04-08 MED ORDER — IOPAMIDOL (ISOVUE-300) INJECTION 61%
INTRAVENOUS | Status: DC | PRN
  Administered 2017-04-08: .1 mL

## 2017-04-08 MED ORDER — LEVOTHYROXINE SODIUM 88 MCG PO TABS
88.0000 ug | ORAL_TABLET | Freq: Every day | ORAL | Status: DC
  Administered 2017-04-10 – 2017-04-12 (×3): 88 ug via ORAL
  Filled 2017-04-08 (×4): qty 1

## 2017-04-08 MED ORDER — SUGAMMADEX SODIUM 200 MG/2ML IV SOLN
INTRAVENOUS | Status: AC
  Filled 2017-04-08: qty 2

## 2017-04-08 MED ORDER — ONDANSETRON HCL 4 MG/2ML IJ SOLN
INTRAMUSCULAR | Status: AC
  Filled 2017-04-08: qty 4

## 2017-04-08 MED ORDER — LIDOCAINE 2% (20 MG/ML) 5 ML SYRINGE
INTRAMUSCULAR | Status: AC
Start: 2017-04-08 — End: ?
  Filled 2017-04-08: qty 5

## 2017-04-08 SURGICAL SUPPLY — 42 items
APPLIER CLIP 5 13 M/L LIGAMAX5 (MISCELLANEOUS) ×4
BLADE CLIPPER SURG (BLADE) IMPLANT
CANISTER SUCT 3000ML PPV (MISCELLANEOUS) ×4 IMPLANT
CHLORAPREP W/TINT 26ML (MISCELLANEOUS) ×4 IMPLANT
CLIP APPLIE 5 13 M/L LIGAMAX5 (MISCELLANEOUS) ×2 IMPLANT
COVER MAYO STAND STRL (DRAPES) IMPLANT
COVER SURGICAL LIGHT HANDLE (MISCELLANEOUS) ×4 IMPLANT
DERMABOND ADVANCED (GAUZE/BANDAGES/DRESSINGS) ×2
DERMABOND ADVANCED .7 DNX12 (GAUZE/BANDAGES/DRESSINGS) ×2 IMPLANT
DRAPE C-ARM 42X72 X-RAY (DRAPES) IMPLANT
ELECT REM PT RETURN 9FT ADLT (ELECTROSURGICAL) ×4
ELECTRODE REM PT RTRN 9FT ADLT (ELECTROSURGICAL) ×2 IMPLANT
FILTER SMOKE EVAC LAPAROSHD (FILTER) IMPLANT
GLOVE BIO SURGEON STRL SZ8 (GLOVE) ×4 IMPLANT
GLOVE BIOGEL PI IND STRL 8 (GLOVE) ×2 IMPLANT
GLOVE BIOGEL PI INDICATOR 8 (GLOVE) ×2
GOWN STRL REUS W/ TWL LRG LVL3 (GOWN DISPOSABLE) ×4 IMPLANT
GOWN STRL REUS W/ TWL XL LVL3 (GOWN DISPOSABLE) ×2 IMPLANT
GOWN STRL REUS W/TWL LRG LVL3 (GOWN DISPOSABLE) ×4
GOWN STRL REUS W/TWL XL LVL3 (GOWN DISPOSABLE) ×2
KIT BASIN OR (CUSTOM PROCEDURE TRAY) ×4 IMPLANT
KIT ROOM TURNOVER OR (KITS) ×4 IMPLANT
L-HOOK LAP DISP 36CM (ELECTROSURGICAL) ×4
LHOOK LAP DISP 36CM (ELECTROSURGICAL) ×2 IMPLANT
NEEDLE 22X1 1/2 (OR ONLY) (NEEDLE) ×4 IMPLANT
NS IRRIG 1000ML POUR BTL (IV SOLUTION) ×4 IMPLANT
PAD ARMBOARD 7.5X6 YLW CONV (MISCELLANEOUS) ×4 IMPLANT
PENCIL BUTTON HOLSTER BLD 10FT (ELECTRODE) ×4 IMPLANT
POUCH RETRIEVAL ECOSAC 10 (ENDOMECHANICALS) ×2 IMPLANT
POUCH RETRIEVAL ECOSAC 10MM (ENDOMECHANICALS) ×2
SCISSORS LAP 5X35 DISP (ENDOMECHANICALS) ×4 IMPLANT
SET CHOLANGIOGRAPH 5 50 .035 (SET/KITS/TRAYS/PACK) IMPLANT
SET IRRIG TUBING LAPAROSCOPIC (IRRIGATION / IRRIGATOR) ×4 IMPLANT
SLEEVE ENDOPATH XCEL 5M (ENDOMECHANICALS) ×8 IMPLANT
SPECIMEN JAR SMALL (MISCELLANEOUS) ×4 IMPLANT
SUT VIC AB 4-0 PS2 27 (SUTURE) ×4 IMPLANT
TOWEL OR 17X24 6PK STRL BLUE (TOWEL DISPOSABLE) ×4 IMPLANT
TOWEL OR 17X26 10 PK STRL BLUE (TOWEL DISPOSABLE) ×4 IMPLANT
TRAY LAPAROSCOPIC MC (CUSTOM PROCEDURE TRAY) ×4 IMPLANT
TROCAR XCEL BLUNT TIP 100MML (ENDOMECHANICALS) ×4 IMPLANT
TROCAR XCEL NON-BLD 5MMX100MML (ENDOMECHANICALS) ×4 IMPLANT
TUBING INSUFFLATION (TUBING) ×4 IMPLANT

## 2017-04-08 NOTE — Anesthesia Postprocedure Evaluation (Signed)
Anesthesia Post Note  Patient: Charlotte Castro  Procedure(s) Performed: LAPAROSCOPIC CHOLECYSTECTOMY (N/A Abdomen)     Patient location during evaluation: PACU Anesthesia Type: General Level of consciousness: awake Pain management: pain level controlled Vital Signs Assessment: post-procedure vital signs reviewed and stable Respiratory status: spontaneous breathing Cardiovascular status: stable Anesthetic complications: no    Last Vitals:  Vitals:   04/08/17 1511 04/08/17 1515  BP: (!) 160/78   Pulse: 76 78  Resp: 14 19  Temp:    SpO2: 98% 97%    Last Pain:  Vitals:   04/08/17 1510  TempSrc:   PainSc: Asleep                 Leon Montoya

## 2017-04-08 NOTE — Transfer of Care (Signed)
Immediate Anesthesia Transfer of Care Note  Patient: Charlotte Castro  Procedure(s) Performed: LAPAROSCOPIC CHOLECYSTECTOMY (N/A Abdomen)  Patient Location: PACU  Anesthesia Type:General  Level of Consciousness: drowsy  Airway & Oxygen Therapy: Patient Spontanous Breathing and Patient connected to face mask oxygen  Post-op Assessment: Report given to RN and Post -op Vital signs reviewed and stable  Post vital signs: Reviewed and stable  Last Vitals:  Vitals:   04/08/17 0600 04/08/17 1009  BP: (!) 176/77 (!) 144/76  Pulse: 76 80  Resp: 18   Temp: (!) 36.4 C   SpO2: 99%     Last Pain:  Vitals:   04/08/17 0750  TempSrc:   PainSc: 2       Patients Stated Pain Goal: 0 (53/79/43 2761)  Complications: No apparent anesthesia complications

## 2017-04-08 NOTE — Progress Notes (Addendum)
PROGRESS NOTE    Charlotte Castro  RWE:[id number] DOB: Oct 19, 1932 DOA: 04/05/2017 PCP: Leeroy Cha, MD     Brief Narrative:  Charlotte Castro is a 83 y.o. female with medical history significant of A Fib, HTN, GERD who presents with nausea, vomiting, epigastric abdominal pain. Work up as revealed acute gallstone pancreatitis with lipase > 3000. She was admitted for further evaluation and treatment.  Assessment & Plan:   Principal Problem:   Acute gallstone pancreatitis Active Problems:   Permanent atrial fibrillation (HCC)   Essential hypertension, benign   Chronic diastolic CHF (congestive heart failure) (HCC)   Abdominal pain, acute, epigastric   Choledocholithiasis with obstruction   Persistent atrial fibrillation ([place])   Acute gallstone pancreatitis with choledocholithiasis  -GI and general surgery consulted -OR today for lap chole/IOC. Appreciate cardiac clearance   Permanent A Fib -Lopressor -Hold coumadin. Given vit K pre-op   Essential HTN -Lopressor   Chronic diastolic HF  -Euvolemic on exam    DVT prophylaxis: SCD Code Status: Full Family Communication: No family at bedside, left VM for son 10/25  Disposition Plan: Pending surgical procedure    Consultants:   GI  General surgery  Procedures:   None   Antimicrobials:  Anti-infectives    Start     Dose/Rate Route Frequency Ordered Stop   04/08/17 1309  ciprofloxacin (CIPRO) 400 MG/200ML IVPB  Status:  Discontinued    Comments:  Schonewitz, Leigh   : cabinet override      04/08/17 1309 04/08/17 1316   04/08/17 1000  [MAR Hold]  ciprofloxacin (CIPRO) IVPB 400 mg     (MAR Hold since 04/08/17 1310)   400 mg 200 mL/hr over 60 Minutes Intravenous On call to O.R. 04/07/17 6195 04/09/17 0559   04/05/17 2000  piperacillin-tazobactam (ZOSYN) IVPB 3.375 g  Status:  Discontinued     3.375 g 12.5 mL/hr over 240 Minutes Intravenous Every 8 hours 04/05/17 1220 04/06/17 1418   04/05/17 1215   piperacillin-tazobactam (ZOSYN) IVPB 3.375 g     3.375 g 100 mL/hr over 30 Minutes Intravenous  Once 04/05/17 1216 04/05/17 1447   04/05/17 1145  piperacillin-tazobactam (ZOSYN) IVPB 4.5 g  Status:  Discontinued     4.5 g 200 mL/hr over 30 Minutes Intravenous  Once 04/05/17 1137 04/05/17 1216       Subjective: Patient states that her abdominal pain is much better. No further nausea, vomiting.   Objective: Vitals:   04/08/17 0500 04/08/17 0600 04/08/17 1009 04/08/17 1316  BP: (!) 176/77 (!) 176/77 (!) 144/76   Pulse: 76 76 80   Resp: 17 18    Temp: (!) 97.5 F (36.4 C) (!) 97.5 F (36.4 C)    TempSrc: Oral Oral    SpO2: 99% 99%    Weight:    96.6 kg (213 lb)  Height:    5\' 8"  (1.727 m)    Intake/Output Summary (Last 24 hours) at 04/08/17 1325 Last data filed at 04/08/17 0900  Gross per 24 hour  Intake           856.25 ml  Output             1300 ml  Net          -443.75 ml   Filed Weights   04/07/17 0301 04/07/17 2034 04/08/17 1316  Weight: 98.4 kg (217 lb) 96.6 kg (213 lb) 96.6 kg (213 lb)    Examination:  General exam: Appears calm and comfortable  Respiratory system:  Clear to auscultation. Respiratory effort normal. Cardiovascular system: S1 & S2 heard. No JVD, murmurs, rubs, gallops or clicks. No pedal edema. Gastrointestinal system: Abdomen is nondistended, soft and nontender. No organomegaly or masses felt. Normal bowel sounds heard. Central nervous system: Alert and oriented. No focal neurological deficits. Extremities: Symmetric 5 x 5 power. Skin: No rashes, lesions or ulcers Psychiatry: Judgement and insight appear normal. Mood & affect appropriate.   Data Reviewed: I have personally reviewed following labs and imaging studies  CBC:  Recent Labs Lab 04/05/17 1010 04/06/17 0521 04/07/17 0448 04/08/17 0637  WBC 15.1* 6.9 9.3 8.3  NEUTROABS  --   --  7.1 6.0  HGB 15.5* 12.6 13.7 13.4  HCT 44.4 35.9* 39.6 37.7  MCV 92.9 93.7 93.2 93.1  PLT 180 126*  127* 829*   Basic Metabolic Panel:  Recent Labs Lab 04/05/17 1010 04/06/17 0521 04/07/17 0448 04/08/17 0637  NA 140 140 141 141  K 4.5 3.8 3.8 3.6  CL 106 111 108 110  CO2 23 25 25 24   GLUCOSE 133* 82 95 90  BUN 19 12 8 6   CREATININE 0.98 0.97 0.80 0.72  CALCIUM 10.0 8.3* 8.7* 8.6*   GFR: Estimated Creatinine Clearance: 65.9 mL/min (by C-G formula based on SCr of 0.72 mg/dL). Liver Function Tests:  Recent Labs Lab 04/05/17 1010 04/06/17 0521 04/07/17 0448 04/08/17 0637  AST 308* 129* 72* 37  ALT 140* 123* 93* 61*  ALKPHOS 98 78 78 73  BILITOT 2.7* 2.0* 1.4* 1.6*  PROT 7.0 5.4* 6.1* 6.0*  ALBUMIN 4.1 2.8* 3.2* 3.0*    Recent Labs Lab 04/05/17 1010 04/06/17 0521 04/07/17 0448 04/08/17 0637  LIPASE 3,514* 364* 76* 64*   No results for input(s): AMMONIA in the last 168 hours. Coagulation Profile:  Recent Labs Lab 04/05/17 1743 04/07/17 0448 04/08/17 0637 04/08/17 1135  INR 2.07 1.98 1.90 1.62   Cardiac Enzymes: No results for input(s): CKTOTAL, CKMB, CKMBINDEX, TROPONINI in the last 168 hours. BNP (last 3 results) No results for input(s): PROBNP in the last 8760 hours. HbA1C: No results for input(s): HGBA1C in the last 72 hours. CBG: No results for input(s): GLUCAP in the last 168 hours. Lipid Profile: No results for input(s): CHOL, HDL, LDLCALC, TRIG, CHOLHDL, LDLDIRECT in the last 72 hours. Thyroid Function Tests: No results for input(s): TSH, T4TOTAL, FREET4, T3FREE, THYROIDAB in the last 72 hours. Anemia Panel: No results for input(s): VITAMINB12, FOLATE, FERRITIN, TIBC, IRON, RETICCTPCT in the last 72 hours. Sepsis Labs:  Recent Labs Lab 04/05/17 1019 04/05/17 1233 04/05/17 1743 04/05/17 2025  LATICACIDVEN 2.51* 3.18* 1.3 0.9    Recent Results (from the past 240 hour(s))  Blood Culture (routine x 2)     Status: None (Preliminary result)   Collection Time: 04/05/17 11:34 AM  Result Value Ref Range Status   Specimen Description BLOOD  RIGHT ANTECUBITAL  Final   Special Requests   Final    BOTTLES DRAWN AEROBIC AND ANAEROBIC Blood Culture results may not be optimal due to an excessive volume of blood received in culture bottles   Culture NO GROWTH 3 DAYS  Final   Report Status PENDING  Incomplete  Blood Culture (routine x 2)     Status: None (Preliminary result)   Collection Time: 04/05/17 11:51 AM  Result Value Ref Range Status   Specimen Description BLOOD LEFT FOREARM  Final   Special Requests   Final    BOTTLES DRAWN AEROBIC AND ANAEROBIC Blood Culture adequate volume  Culture NO GROWTH 3 DAYS  Final   Report Status PENDING  Incomplete  Surgical PCR screen     Status: None   Collection Time: 04/07/17 10:31 PM  Result Value Ref Range Status   MRSA, PCR NEGATIVE NEGATIVE Final   Staphylococcus aureus NEGATIVE NEGATIVE Final    Comment: (NOTE) The Xpert SA Assay (FDA approved for NASAL specimens in patients 37 years of age and older), is one component of a comprehensive surveillance program. It is not intended to diagnose infection nor to guide or monitor treatment.        Radiology Studies: No results found.    Scheduled Meds: . [MAR Hold] acetaminophen  650 mg Oral Q6H  . [MAR Hold] clonazePAM  0.25-0.5 mg Oral BID  . [MAR Hold] feeding supplement  1 Container Oral TID BM  . [MAR Hold] metoprolol tartrate  50 mg Oral BID  . [MAR Hold] multivitamin with minerals  1 tablet Oral Daily   Continuous Infusions: . sodium chloride 75 mL/hr at 04/08/17 1016  . [MAR Hold] sodium chloride    . [MAR Hold] ciprofloxacin    . lactated ringers       LOS: 3 days    Time spent: 20 minutes   Dessa Phi, DO [facility] [email or web address] Password [email or web address] 04/08/2017, 1:25 PM

## 2017-04-08 NOTE — Progress Notes (Signed)
[facility] Gastroenterology Progress Note  Charlotte Castro 83 y.o. 1933/01/30  CC:  Acute gallstone pancreatitis   Subjective:  Patient's abdominal pain is improving. She is feeling better.   Objective: Vital signs in last 24 hours: Vitals:   04/08/17 0600 04/08/17 1009  BP: (!) 176/77 (!) 144/76  Pulse: 76 80  Resp: 18   Temp: (!) 97.5 F (36.4 C)   SpO2: 99%     Physical Exam:  General:  Alert, cooperative, no distress, morbidly obese   Head:  Normocephalic, without obvious abnormality, atraumatic  Eyes:  , EOM's intact,   Lungs:   Clear to auscultation bilaterally, respirations unlabored  Heart:  Regular rate and rhythm, S1, S2 normal  Abdomen:   Soft, NT,  bowel sounds active all four quadrants,  no masses,           Lab Results:  Recent Labs  04/07/17 0448 04/08/17 0637  NA 141 141  K 3.8 3.6  CL 108 110  CO2 25 24  GLUCOSE 95 90  BUN 8 6  CREATININE 0.80 0.72  CALCIUM 8.7* 8.6*    Recent Labs  04/07/17 0448 04/08/17 0637  AST 72* 37  ALT 93* 61*  ALKPHOS 78 73  BILITOT 1.4* 1.6*  PROT 6.1* 6.0*  ALBUMIN 3.2* 3.0*    Recent Labs  04/07/17 0448 04/08/17 0637  WBC 9.3 8.3  NEUTROABS 7.1 6.0  HGB 13.7 13.4  HCT 39.6 37.7  MCV 93.2 93.1  PLT 127* 137*    Recent Labs  04/08/17 0637 04/08/17 1135  LABPROT 21.6* 19.1*  INR 1.90 1.62      Assessment/Plan: - Acute gallstone pancreatitis - Elevated LFTs. Trending down - Large hiatal hernia - Atrial fibrillation - anticoagulation on hold  Recommendations ------------------------ - Surgery is planning for laparoscopic cholecystectomy with possible IOC today depending on INR  - ERCP only if positive IOC. GI will follow   Otis Brace MD, [place] 04/08/2017, 12:38 PM  Pager [phone]  If no answer or after 5 PM call [phone]

## 2017-04-08 NOTE — Anesthesia Preprocedure Evaluation (Addendum)
Anesthesia Evaluation  Patient identified by MRN, date of birth, ID band Patient awake    Reviewed: Allergy & Precautions, NPO status , Patient's Chart, lab work & pertinent test results, reviewed documented beta blocker date and time   Airway Mallampati: II  TM Distance: >3 FB Neck ROM: Full    Dental  (+) Dental Advisory Given   Pulmonary neg pulmonary ROS,    breath sounds clear to auscultation       Cardiovascular hypertension, Pt. on medications and Pt. on home beta blockers +CHF   Rhythm:Regular Rate:Normal     Neuro/Psych  Headaches, Anxiety    GI/Hepatic Neg liver ROS, GERD  ,  Endo/Other  Hypothyroidism   Renal/GU negative Renal ROS     Musculoskeletal  (+) Arthritis ,   Abdominal   Peds  Hematology negative hematology ROS (+)   Anesthesia Other Findings   Reproductive/Obstetrics                            Anesthesia Physical Anesthesia Plan  ASA: III  Anesthesia Plan: General   Post-op Pain Management:    Induction: Intravenous  PONV Risk Score and Plan: 4 or greater and Ondansetron, Dexamethasone and Treatment may vary due to age or medical condition  Airway Management Planned: Oral ETT  Additional Equipment:   Intra-op Plan:   Post-operative Plan: Extubation in OR  Informed Consent: I have reviewed the patients History and Physical, chart, labs and discussed the procedure including the risks, benefits and alternatives for the proposed anesthesia with the patient or authorized representative who has indicated his/her understanding and acceptance.   Dental advisory given  Plan Discussed with: CRNA, Anesthesiologist and Surgeon  Anesthesia Plan Comments:        Anesthesia Quick Evaluation

## 2017-04-08 NOTE — Op Note (Signed)
04/05/2017 - 04/08/2017  2:47 PM  PATIENT:  Charlotte Castro  83 y.o. female  PRE-OPERATIVE DIAGNOSIS:  Gallstone pancreatitis  POST-OPERATIVE DIAGNOSIS:  Gallstone pancreatitis  PROCEDURE:  Procedure(s): LAPAROSCOPIC CHOLECYSTECTOMY  SURGEON:  Surgeon(s): Georganna Skeans, MD  ASSISTANTS: Jackson Latino, PAC   ANESTHESIA:   local and general  EBL:  Total I/O In: 600 [I.V.:600] Out: 10 [Blood:10]  BLOOD ADMINISTERED:none  DRAINS: none   SPECIMEN:  Excision  DISPOSITION OF SPECIMEN:  PATHOLOGY  COUNTS:  YES  DICTATION: .Dragon Dictation Findings: mild cholecystitis, cholelithiasis Procedure in detail: [name] presents for cholecystectomy.  She received intravenous antibiotics.  Informed consent was obtained.  She was brought to the operating room and general endotracheal anesthesia was administered by the anesthesia staff.  Her abdomen was prepped and draped in sterile fashion.The infraumbilical region was infiltrated with local. Infraumbilical incision was made. Subcutaneous tissues were dissected down revealing the anterior fascia. This was divided sharply along the midline. Peritoneal cavity was entered under direct vision without complication. A 0 Vicryl pursestring was placed around the fascial opening. Hassan trocar was inserted into the abdomen. The abdomen was insufflated with carbon dioxide in standard fashion. Under direct vision a 5 mm epigastric and 2 5 mm right abdominal ports were placed.  Local was used at each port site.  Laparoscopic expiration revealed mildly inflamed gallbladder.  It contained multiple gallstones.  The dome was retracted superior medially.  The infundibulum was retracted laterally.  Dissection began laterally and progressed medially easily identifying the cystic duct and cystic artery anterior branch.  Dissection continued until there was a critical view achieved.  Once this was accomplished, and consideration of her severe contrast allergy, clinical  exam was not performed.  3 clips were placed proximally and the cystic duct.  One was placed distally and it was divided.  2 clips were placed proximally and the cystic artery and it was divided with cautery.  The gallbladder was dissected off the liver bed.  2 small posterior branches of the cystic artery was encountered and these were clipped proximally.  The gallbladder was taken off the liver bed using cautery and placed in a bag.  It was taken of the abdomen.  It was sent to pathology.  The liver bed was cauterized to get excellent hemostasis.  The area was irrigated.  There was no bleeding.  Clips remain in good position.  Irrigation fluid was evacuated.  Ports were removed under direct vision.  Pneumoperitoneum was released.  The umbilical fascia was closed by tying the pursestring.  All 4 wounds were irrigated and the skin which was closed with 4-0 Vicryl followed by Dermabond.  All counts were correct.  She tolerated the procedure well without apparent complication and was taken recovery in stable condition. PATIENT DISPOSITION:  PACU - hemodynamically stable.   Delay start of Pharmacological VTE agent (>24hrs) due to surgical blood loss or risk of bleeding:  no  Georganna Skeans, MD, MPH, FACS Pager: [phone]  10/26/20182:47 PM

## 2017-04-08 NOTE — Anesthesia Procedure Notes (Signed)
Procedure Name: Intubation Date/Time: 04/08/2017 1:38 PM Performed by: Lieutenant Diego Pre-anesthesia Checklist: Patient identified, Emergency Drugs available, Suction available and Patient being monitored Patient Re-evaluated:Patient Re-evaluated prior to induction Oxygen Delivery Method: Circle system utilized Preoxygenation: Pre-oxygenation with 100% oxygen Induction Type: IV induction Ventilation: Mask ventilation without difficulty Laryngoscope Size: Miller and 2 Grade View: Grade I Tube type: Oral Number of attempts: 1 Airway Equipment and Method: Stylet and Oral airway Placement Confirmation: ETT inserted through vocal cords under direct vision,  positive ETCO2 and breath sounds checked- equal and bilateral Tube secured with: Tape Dental Injury: Teeth and Oropharynx as per pre-operative assessment

## 2017-04-08 NOTE — Progress Notes (Signed)
Day of Surgery   Subjective/Chief Complaint: No abdominal pain   Objective: Vital signs in last 24 hours: Temp:  [97.5 F (36.4 C)-98.3 F (36.8 C)] 97.5 F (36.4 C) (10/26 0600) Pulse Rate:  [72-78] 76 (10/26 0600) Resp:  [17-18] 18 (10/26 0600) BP: (133-176)/(62-83) 176/77 (10/26 0600) SpO2:  [95 %-99 %] 99 % (10/26 0600) Weight:  [96.6 kg (213 lb)] 96.6 kg (213 lb) (10/25 2034) Last BM Date: 04/05/17  Intake/Output from previous day: 10/25 0701 - 10/26 0700 In: 856.3 [P.O.:30; I.V.:826.3] Out: 2200 [Urine:2200] Intake/Output this shift: No intake/output data recorded.  General appearance: alert and cooperative Resp: clear to auscultation bilaterally Cardio: irregularly irregular rhythm GI: soft, NT, ND  Lab Results:   Recent Labs  04/07/17 0448 04/08/17 0637  WBC 9.3 8.3  HGB 13.7 13.4  HCT 39.6 37.7  PLT 127* 137*   BMET  Recent Labs  04/07/17 0448 04/08/17 0637  NA 141 141  K 3.8 3.6  CL 108 110  CO2 25 24  GLUCOSE 95 90  BUN 8 6  CREATININE 0.80 0.72  CALCIUM 8.7* 8.6*   PT/INR  Recent Labs  04/07/17 0448 04/08/17 0637  LABPROT 22.4* 21.6*  INR 1.98 1.90   ABG No results for input(s): PHART, HCO3 in the last 72 hours.  Invalid input(s): PCO2, PO2  Studies/Results: No results found.  Anti-infectives: Anti-infectives    Start     Dose/Rate Route Frequency Ordered Stop   04/08/17 1000  ciprofloxacin (CIPRO) IVPB 400 mg     400 mg 200 mL/hr over 60 Minutes Intravenous On call to O.R. 04/07/17 3335 04/09/17 0559   04/05/17 2000  piperacillin-tazobactam (ZOSYN) IVPB 3.375 g  Status:  Discontinued     3.375 g 12.5 mL/hr over 240 Minutes Intravenous Every 8 hours 04/05/17 1220 04/06/17 1418   04/05/17 1215  piperacillin-tazobactam (ZOSYN) IVPB 3.375 g     3.375 g 100 mL/hr over 30 Minutes Intravenous  Once 04/05/17 1216 04/05/17 1447   04/05/17 1145  piperacillin-tazobactam (ZOSYN) IVPB 4.5 g  Status:  Discontinued     4.5 g 200  mL/hr over 30 Minutes Intravenous  Once 04/05/17 1137 04/05/17 1216      Assessment/Plan: Biliary pancreatitis - scheduled for lap cholecystectomy/IOC today. INR still 1.9. Will give Vitamin K and re-check. If INR < 1.5 will proceed later this AM. I again discussed the procedure, risks, and benefits. Cardiology input appreciated. She is agreeable and hopes to proceed today.  LOS: 3 days    Kainon Varady E 04/08/2017

## 2017-04-09 ENCOUNTER — Encounter (HOSPITAL_COMMUNITY): Payer: Self-pay | Admitting: General Surgery

## 2017-04-09 LAB — BASIC METABOLIC PANEL
ANION GAP: 10 (ref 5–15)
BUN: 8 mg/dL (ref 6–20)
CO2: 19 mmol/L — ABNORMAL LOW (ref 22–32)
Calcium: 8.6 mg/dL — ABNORMAL LOW (ref 8.9–10.3)
Chloride: 112 mmol/L — ABNORMAL HIGH (ref 101–111)
Creatinine, Ser: 0.75 mg/dL (ref 0.44–1.00)
GFR calc Af Amer: >60 mL/min (ref >60)
GLUCOSE: 110 mg/dL — AB (ref 65–99)
POTASSIUM: 3.7 mmol/L (ref 3.5–5.1)
Sodium: 141 mmol/L (ref 135–145)

## 2017-04-09 LAB — HEPATIC FUNCTION PANEL
ALT: 59 U/L — AB (ref 14–54)
AST: 49 U/L — AB (ref 15–41)
Albumin: 3 g/dL — ABNORMAL LOW (ref 3.5–5.0)
Alkaline Phosphatase: 82 U/L (ref 38–126)
BILIRUBIN DIRECT: 0.4 mg/dL (ref 0.1–0.5)
Indirect Bilirubin: 1.1 mg/dL — ABNORMAL HIGH (ref 0.3–0.9)
Total Bilirubin: 1.5 mg/dL — ABNORMAL HIGH (ref 0.3–1.2)
Total Protein: 6.4 g/dL — ABNORMAL LOW (ref 6.5–8.1)

## 2017-04-09 LAB — CBC WITH DIFFERENTIAL/PLATELET
BASOS ABS: 0 10*3/uL (ref 0.0–0.1)
Basophils Relative: 0 %
EOS PCT: 0 %
Eosinophils Absolute: 0 10*3/uL (ref 0.0–0.7)
HEMATOCRIT: 39.4 % (ref 36.0–46.0)
HEMOGLOBIN: 14 g/dL (ref 12.0–15.0)
LYMPHS PCT: 11 %
Lymphs Abs: 1.2 10*3/uL (ref 0.7–4.0)
MCH: 32.6 pg (ref 26.0–34.0)
MCHC: 35.5 g/dL (ref 30.0–36.0)
MCV: 91.8 fL (ref 78.0–100.0)
Monocytes Absolute: 0.9 10*3/uL (ref 0.1–1.0)
Monocytes Relative: 8 %
NEUTROS ABS: 9.2 10*3/uL — AB (ref 1.7–7.7)
NEUTROS PCT: 81 %
PLATELETS: 156 10*3/uL (ref 150–400)
RBC: 4.29 MIL/uL (ref 3.87–5.11)
RDW: 13.2 % (ref 11.5–15.5)
WBC: 11.3 10*3/uL — AB (ref 4.0–10.5)

## 2017-04-09 MED ORDER — HALOPERIDOL LACTATE 5 MG/ML IJ SOLN: 2.0000 mg | Freq: Four times a day (QID) | INTRAMUSCULAR | Status: DC | PRN

## 2017-04-09 MED ORDER — MUSCLE RUB 10-15 % EX CREA
TOPICAL_CREAM | CUTANEOUS | Status: DC | PRN
  Filled 2017-04-09: qty 85

## 2017-04-09 MED ORDER — ENOXAPARIN SODIUM 40 MG/0.4ML ~~LOC~~ SOLN
40.0000 mg | SUBCUTANEOUS | Status: DC
  Administered 2017-04-10: 40 mg via SUBCUTANEOUS
  Filled 2017-04-09: qty 0.4

## 2017-04-09 NOTE — Progress Notes (Signed)
In to assess patient.  She answers all questions accurately and appropriately.  Complains of nausea and her condition getting worse.  Refuses all medications and therapies.  Verbalizes understanding of the purpose of her medications yet still refuses to take them.  States " I just don't want them" .  There is no IV in place and patient understands that we will be unable to administer emergency medications if her condition worsens.  Encouraged to take medications, allow IV placement, wear SCD hose and call staff for assistance.

## 2017-04-09 NOTE — Progress Notes (Signed)
[facility] PROGRESS NOTE Subjective patient states that she still is having pain and nausea  Objective: Vital signs in last 24 hours: Temp:  [97.3 F (36.3 C)-97.6 F (36.4 C)] 97.4 F (36.3 C) (10/27 0443) Pulse Rate:  [74-94] 81 (10/27 0443) Resp:  [14-28] 17 (10/27 0443) BP: (144-162)/(75-93) 156/84 (10/27 0443) SpO2:  [90 %-100 %] 99 % (10/27 0443) Weight:  [96.6 kg (213 lb)-99.8 kg (220 lb)] 99.8 kg (220 lb) (10/27 0443) Last BM Date: 04/05/17  Intake/Output from previous day: 10/26 0701 - 10/27 0700 In: 1811.3 [I.V.:1811.3] Out: 10 [Blood:10] Intake/Output this shift: No intake/output data recorded.  PE: General-- alert seems to be a comfortable  Abdomen-- moderate epigastric tenderness  Lab Results:  Recent Labs  04/07/17 0448 04/08/17 0637  WBC 9.3 8.3  HGB 13.7 13.4  HCT 39.6 37.7  PLT 127* 137*   BMET  Recent Labs  04/07/17 0448 04/08/17 0637  NA 141 141  K 3.8 3.6  CL 108 110  CO2 25 24  CREATININE 0.80 0.72   LFT  Recent Labs  04/07/17 0448 04/08/17 0637  PROT 6.1* 6.0*  AST 72* 37  ALT 93* 61*  ALKPHOS 78 73  BILITOT 1.4* 1.6*  BILIDIR 0.4  --   IBILI 1.0*  --    PT/INR  Recent Labs  04/07/17 0448 04/08/17 0637 04/08/17 1135  LABPROT 22.4* 21.6* 19.1*  INR 1.98 1.90 1.62   PANCREAS  Recent Labs  04/07/17 0448 04/08/17 0637  LIPASE 76* 64*         Studies/Results: No results found.  Medications: I have reviewed the patient's current medications.  Assessment:   1. Gallstone pancreatitis. Patient had laparoscopic cholecystectomy yesterday but no IOC was performed due to her history of contrast allergy. Hopefully her discomfort is just due to resolving pancreatitis.   Plan: 1. We will continue to follow. If there is a rise in her liver test she may need ERCP or MRCP to make certain there are no CBD stones.   Ravinder Lukehart JR,Aston Lawhorn L 04/09/2017, 8:01 AM  This note was created using voice  recognition software. Minor errors may Have occurred unintentionally.  Pager: [phone] If no answer or after hours call [phone]

## 2017-04-09 NOTE — Progress Notes (Signed)
PROGRESS NOTE    Charlotte Castro  QJJ:[id number] DOB: 11-03-1932 DOA: 04/05/2017 PCP: Leeroy Cha, MD     Brief Narrative:  Charlotte Castro is a 83 y.o. female with medical history significant of A Fib, HTN, GERD who presents with nausea, vomiting, epigastric abdominal pain. Work up as revealed acute gallstone pancreatitis with lipase > 3000. She was admitted for further evaluation and treatment. GI and general surgery were consulted. Patient ultimately underwent laparoscopic cholecystectomy on 10/26.  Assessment & Plan:   Principal Problem:   Acute gallstone pancreatitis Active Problems:   Permanent atrial fibrillation (HCC)   Essential hypertension, benign   Chronic diastolic CHF (congestive heart failure) (HCC)   Abdominal pain, acute, epigastric   Choledocholithiasis with obstruction   Persistent atrial fibrillation ([place])   Acute gallstone pancreatitis with choledocholithiasis  -GI and general surgery consulted -S/p lap chole 10/26  -Trend CMP  -IVF  Acute encephalopathy -She has been confused (although oriented to self, place, year) this morning, calling 911, asking for the police (does not tell me why she needs police), hallucinating, and not acting herself. This is apparently not new per family. Neuro exam was unremarkable. Discontinue narcotic pain medications, continue to re-orient patient.  -Haldol prn and sitter if safety becomes concern   Permanent A Fib -Lopressor -Resume coumadin when okay with GI and general surgery, if ERCP not planned   Hypothyroidism -Synthroid  Essential HTN -Lopressor   Chronic diastolic HF  -Euvolemic on exam    DVT prophylaxis: Lovenox for ppx  Code Status: Full Family Communication: No family at bedside, spoke with son over the phone 10/27  Disposition Plan: Pending improvement    Consultants:   GI  General surgery  Procedures:   None   Antimicrobials:  Anti-infectives    Start     Dose/Rate Route  Frequency Ordered Stop   04/08/17 1309  ciprofloxacin (CIPRO) 400 MG/200ML IVPB  Status:  Discontinued    Comments:  Schonewitz, Leigh   : cabinet override      04/08/17 1309 04/08/17 1316   04/08/17 1000  ciprofloxacin (CIPRO) IVPB 400 mg     400 mg 200 mL/hr over 60 Minutes Intravenous On call to O.R. 04/07/17 4818 04/08/17 1405   04/05/17 2000  piperacillin-tazobactam (ZOSYN) IVPB 3.375 g  Status:  Discontinued     3.375 g 12.5 mL/hr over 240 Minutes Intravenous Every 8 hours 04/05/17 1220 04/06/17 1418   04/05/17 1215  piperacillin-tazobactam (ZOSYN) IVPB 3.375 g     3.375 g 100 mL/hr over 30 Minutes Intravenous  Once 04/05/17 1216 04/05/17 1447   04/05/17 1145  piperacillin-tazobactam (ZOSYN) IVPB 4.5 g  Status:  Discontinued     4.5 g 200 mL/hr over 30 Minutes Intravenous  Once 04/05/17 1137 04/05/17 1216       Subjective: Patient is confused this morning. She states that she has called [facility], the police and her lawyer and no one has been able to come and see her. When asked why she needs the police and lawyer, she tells me that it is none of my business. She is oriented to self, place, year. She states that whatever we did to her did not help her with her symptoms. States that Elvis was performing, that we took her teeth last time. Per nursing, patient has been agitated with staff, pulled out IV, throwing food.   Objective: Vitals:   04/08/17 1515 04/08/17 2231 04/08/17 2231 04/09/17 0443  BP:  (!) 162/93 (!) 162/93 Marland Kitchen)  156/84  Pulse: 78 94 94 81  Resp: 19 18 18 17   Temp:  97.6 F (36.4 C) 97.6 F (36.4 C) (!) 97.4 F (36.3 C)  TempSrc:  Oral Oral Oral  SpO2: 97% 97% 97% 99%  Weight:    99.8 kg (220 lb)  Height:        Intake/Output Summary (Last 24 hours) at 04/09/17 1234 Last data filed at 04/09/17 0618  Gross per 24 hour  Intake          1811.25 ml  Output               10 ml  Net          1801.25 ml   Filed Weights   04/07/17 2034 04/08/17 1316 04/09/17  0443  Weight: 96.6 kg (213 lb) 96.6 kg (213 lb) 99.8 kg (220 lb)    Examination:  General exam: Appears confused  Respiratory system: Clear to auscultation. Respiratory effort normal. Cardiovascular system: S1 & S2 heard. No JVD, murmurs, rubs, gallops or clicks. No pedal edema. Gastrointestinal system: Abdomen is nondistended, soft and minimally tender to palpation epigastric. No organomegaly or masses felt. Normal bowel sounds heard. Central nervous system: Alert and oriented, remains confused overall. Nonfocal exam, able to move all extremities appropriately, CN 2-12 grossly normal. Speech fluent.  Extremities: Symmetric  Skin: No rashes, lesions or ulcers Psychiatry: confused this morning   Data Reviewed: I have personally reviewed following labs and imaging studies  CBC:  Recent Labs Lab 04/05/17 1010 04/06/17 0521 04/07/17 0448 04/08/17 0637 04/09/17 0731  WBC 15.1* 6.9 9.3 8.3 11.3*  NEUTROABS  --   --  7.1 6.0 9.2*  HGB 15.5* 12.6 13.7 13.4 14.0  HCT 44.4 35.9* 39.6 37.7 39.4  MCV 92.9 93.7 93.2 93.1 91.8  PLT 180 126* 127* 137* 202   Basic Metabolic Panel:  Recent Labs Lab 04/05/17 1010 04/06/17 0521 04/07/17 0448 04/08/17 0637 04/09/17 0731  NA 140 140 141 141 141  K 4.5 3.8 3.8 3.6 3.7  CL 106 111 108 110 112*  CO2 23 25 25 24  19*  GLUCOSE 133* 82 95 90 110*  BUN 19 12 8 6 8   CREATININE 0.98 0.97 0.80 0.72 0.75  CALCIUM 10.0 8.3* 8.7* 8.6* 8.6*   GFR: Estimated Creatinine Clearance: 67 mL/min (by C-G formula based on SCr of 0.75 mg/dL). Liver Function Tests:  Recent Labs Lab 04/05/17 1010 04/06/17 0521 04/07/17 0448 04/08/17 0637 04/09/17 0731  AST 308* 129* 72* 37 49*  ALT 140* 123* 93* 61* 59*  ALKPHOS 98 78 78 73 82  BILITOT 2.7* 2.0* 1.4* 1.6* 1.5*  PROT 7.0 5.4* 6.1* 6.0* 6.4*  ALBUMIN 4.1 2.8* 3.2* 3.0* 3.0*    Recent Labs Lab 04/05/17 1010 04/06/17 0521 04/07/17 0448 04/08/17 0637  LIPASE 3,514* 364* 76* 64*   No results  for input(s): AMMONIA in the last 168 hours. Coagulation Profile:  Recent Labs Lab 04/05/17 1743 04/07/17 0448 04/08/17 0637 04/08/17 1135  INR 2.07 1.98 1.90 1.62   Cardiac Enzymes: No results for input(s): CKTOTAL, CKMB, CKMBINDEX, TROPONINI in the last 168 hours. BNP (last 3 results) No results for input(s): PROBNP in the last 8760 hours. HbA1C: No results for input(s): HGBA1C in the last 72 hours. CBG: No results for input(s): GLUCAP in the last 168 hours. Lipid Profile: No results for input(s): CHOL, HDL, LDLCALC, TRIG, CHOLHDL, LDLDIRECT in the last 72 hours. Thyroid Function Tests: No results for input(s): TSH, T4TOTAL, FREET4,  T3FREE, THYROIDAB in the last 72 hours. Anemia Panel: No results for input(s): VITAMINB12, FOLATE, FERRITIN, TIBC, IRON, RETICCTPCT in the last 72 hours. Sepsis Labs:  Recent Labs Lab 04/05/17 1019 04/05/17 1233 04/05/17 1743 04/05/17 2025  LATICACIDVEN 2.51* 3.18* 1.3 0.9    Recent Results (from the past 240 hour(s))  Blood Culture (routine x 2)     Status: None (Preliminary result)   Collection Time: 04/05/17 11:34 AM  Result Value Ref Range Status   Specimen Description BLOOD RIGHT ANTECUBITAL  Final   Special Requests   Final    BOTTLES DRAWN AEROBIC AND ANAEROBIC Blood Culture results may not be optimal due to an excessive volume of blood received in culture bottles   Culture NO GROWTH 4 DAYS  Final   Report Status PENDING  Incomplete  Blood Culture (routine x 2)     Status: None (Preliminary result)   Collection Time: 04/05/17 11:51 AM  Result Value Ref Range Status   Specimen Description BLOOD LEFT FOREARM  Final   Special Requests   Final    BOTTLES DRAWN AEROBIC AND ANAEROBIC Blood Culture adequate volume   Culture NO GROWTH 4 DAYS  Final   Report Status PENDING  Incomplete  Surgical PCR screen     Status: None   Collection Time: 04/07/17 10:31 PM  Result Value Ref Range Status   MRSA, PCR NEGATIVE NEGATIVE Final    Staphylococcus aureus NEGATIVE NEGATIVE Final    Comment: (NOTE) The Xpert SA Assay (FDA approved for NASAL specimens in patients 13 years of age and older), is one component of a comprehensive surveillance program. It is not intended to diagnose infection nor to guide or monitor treatment.        Radiology Studies: No results found.    Scheduled Meds: . acetaminophen  650 mg Oral Q6H  . clonazePAM  0.25-0.5 mg Oral BID  . enoxaparin (LOVENOX) injection  40 mg Subcutaneous Q24H  . feeding supplement  1 Container Oral TID BM  . levothyroxine  88 mcg Oral QAC breakfast  . metoprolol tartrate  50 mg Oral BID  . multivitamin with minerals  1 tablet Oral Daily   Continuous Infusions: . sodium chloride 75 mL/hr at 04/08/17 1959     LOS: 4 days    Time spent: 30 minutes   Dessa Phi, DO [facility] [email or web address] [facility] 04/09/2017, 12:34 PM

## 2017-04-09 NOTE — Progress Notes (Signed)
[facility] Progress Note  1 Day Post-Op  Subjective: CC:  Upset with the staff this AM. Confused speech but patient is oriented to person, place, time. She reports that her abdominal pain is the same. She states that she is not going to eat because it is not helping her.   Objective: Vital signs in last 24 hours: Temp:  [97.3 F (36.3 C)-97.6 F (36.4 C)] 97.4 F (36.3 C) (10/27 0443) Pulse Rate:  [74-94] 81 (10/27 0443) Resp:  [14-28] 17 (10/27 0443) BP: (144-162)/(75-93) 156/84 (10/27 0443) SpO2:  [90 %-100 %] 99 % (10/27 0443) Weight:  [96.6 kg (213 lb)-99.8 kg (220 lb)] 99.8 kg (220 lb) (10/27 0443) Last BM Date: 04/05/17  Intake/Output from previous day: 10/26 0701 - 10/27 0700 In: 1811.3 [I.V.:1811.3] Out: 10 [Blood:10] Intake/Output this shift: No intake/output data recorded.  PE: Gen:  Alert, upset but in no acute distress Pulm:  Normal effort, clear to auscultation bilaterally Abd: Soft, minimal global tenderness, bowel sounds present in all 4 quadrants, incisions C/D/I - some dried sanguinous drainage at umbilical trochar site. Skin: warm and dry, no rashes  Psych: A&Ox3   Lab Results:   Recent Labs  04/08/17 0637 04/09/17 0731  WBC 8.3 11.3*  HGB 13.4 14.0  HCT 37.7 39.4  PLT 137* 156   BMET  Recent Labs  04/08/17 0637 04/09/17 0731  NA 141 141  K 3.6 3.7  CL 110 112*  CO2 24 19*  GLUCOSE 90 110*  BUN 6 8  CREATININE 0.72 0.75  CALCIUM 8.6* 8.6*   PT/INR  Recent Labs  04/08/17 0637 04/08/17 1135  LABPROT 21.6* 19.1*  INR 1.90 1.62   CMP     Component Value Date/Time   NA 141 04/09/2017 0731   K 3.7 04/09/2017 0731   CL 112 (H) 04/09/2017 0731   CO2 19 (L) 04/09/2017 0731   GLUCOSE 110 (H) 04/09/2017 0731   BUN 8 04/09/2017 0731   CREATININE 0.75 04/09/2017 0731   CALCIUM 8.6 (L) 04/09/2017 0731   PROT 6.4 (L) 04/09/2017 0731   ALBUMIN 3.0 (L) 04/09/2017 0731   AST 49 (H) 04/09/2017 0731   ALT 59 (H)  04/09/2017 0731   ALKPHOS 82 04/09/2017 0731   BILITOT 1.5 (H) 04/09/2017 0731   GFRNONAA >60 04/09/2017 0731   GFRAA >60 04/09/2017 0731   Lipase     Component Value Date/Time   LIPASE 64 (H) 04/08/2017 7829       Studies/Results: No results found.  Anti-infectives: Anti-infectives    Start     Dose/Rate Route Frequency Ordered Stop   04/08/17 1309  ciprofloxacin (CIPRO) 400 MG/200ML IVPB  Status:  Discontinued    Comments:  Schonewitz, Leigh   : cabinet override      04/08/17 1309 04/08/17 1316   04/08/17 1000  ciprofloxacin (CIPRO) IVPB 400 mg     400 mg 200 mL/hr over 60 Minutes Intravenous On call to O.R. 04/07/17 0812 04/08/17 1405   04/05/17 2000  piperacillin-tazobactam (ZOSYN) IVPB 3.375 g  Status:  Discontinued     3.375 g 12.5 mL/hr over 240 Minutes Intravenous Every 8 hours 04/05/17 1220 04/06/17 1418   04/05/17 1215  piperacillin-tazobactam (ZOSYN) IVPB 3.375 g     3.375 g 100 mL/hr over 30 Minutes Intravenous  Once 04/05/17 1216 04/05/17 1447   04/05/17 1145  piperacillin-tazobactam (ZOSYN) IVPB 4.5 g  Status:  Discontinued     4.5 g 200 mL/hr over 30 Minutes Intravenous  Once 04/05/17 1137 04/05/17 1216       Assessment/Plan Biliary pancreatitis S/p laparoscopic cholecystectomy 10/26 Dr. Georganna Skeans  - afebrile, VSS, WBC 11 - not unexpected postoperatively   - total bilirubin 1.5 this AM from 1.6 yesterday, repeat CMET in AM.   - tolerating clear liquids but having poor oral intake   - pain control and mobilize   FEN: Clear liquids ID: perioperative cipro 10/26 VTE: SCD's, stable for chemical VTE prophylaxis from surgical perspective.   Plan: patient with poor oral intake and persistent pain. PRN analgesia and advance diet to low fat as pain improves. Repeat CMET in AM to r/o CBD stone.  PT/OT evals.      LOS: 4 days    [facility] 04/09/2017, 9:22 AM Pager: [phone] Consults:  [phone] Mon-Fri 7:00 am-4:30 pm Sat-Sun 7:00 am-11:30 am

## 2017-04-10 LAB — BASIC METABOLIC PANEL
ANION GAP: 10 (ref 5–15)
BUN: 11 mg/dL (ref 6–20)
CALCIUM: 8.7 mg/dL — AB (ref 8.9–10.3)
CO2: 23 mmol/L (ref 22–32)
Chloride: 106 mmol/L (ref 101–111)
Creatinine, Ser: 0.74 mg/dL (ref 0.44–1.00)
Glucose, Bld: 101 mg/dL — ABNORMAL HIGH (ref 65–99)
POTASSIUM: 3.2 mmol/L — AB (ref 3.5–5.1)
Sodium: 139 mmol/L (ref 135–145)

## 2017-04-10 LAB — CBC WITH DIFFERENTIAL/PLATELET
BASOS ABS: 0 10*3/uL (ref 0.0–0.1)
Basophils Relative: 0 %
EOS PCT: 0 %
Eosinophils Absolute: 0 10*3/uL (ref 0.0–0.7)
HCT: 37.6 % (ref 36.0–46.0)
Hemoglobin: 13.5 g/dL (ref 12.0–15.0)
LYMPHS PCT: 12 %
Lymphs Abs: 1.2 10*3/uL (ref 0.7–4.0)
MCH: 32.8 pg (ref 26.0–34.0)
MCHC: 35.9 g/dL (ref 30.0–36.0)
MCV: 91.5 fL (ref 78.0–100.0)
MONO ABS: 0.9 10*3/uL (ref 0.1–1.0)
Monocytes Relative: 10 %
NEUTROS ABS: 7.4 10*3/uL (ref 1.7–7.7)
Neutrophils Relative %: 78 %
PLATELETS: 156 10*3/uL (ref 150–400)
RBC: 4.11 MIL/uL (ref 3.87–5.11)
RDW: 13.3 % (ref 11.5–15.5)
WBC: 9.5 10*3/uL (ref 4.0–10.5)

## 2017-04-10 LAB — CULTURE, BLOOD (ROUTINE X 2)
CULTURE: NO GROWTH
Culture: NO GROWTH
SPECIAL REQUESTS: ADEQUATE

## 2017-04-10 LAB — HEPATIC FUNCTION PANEL
ALBUMIN: 2.9 g/dL — AB (ref 3.5–5.0)
ALT: 47 U/L (ref 14–54)
AST: 33 U/L (ref 15–41)
Alkaline Phosphatase: 81 U/L (ref 38–126)
Bilirubin, Direct: 0.5 mg/dL (ref 0.1–0.5)
Indirect Bilirubin: 1.2 mg/dL — ABNORMAL HIGH (ref 0.3–0.9)
TOTAL PROTEIN: 6.3 g/dL — AB (ref 6.5–8.1)
Total Bilirubin: 1.7 mg/dL — ABNORMAL HIGH (ref 0.3–1.2)

## 2017-04-10 LAB — MAGNESIUM: Magnesium: 1.7 mg/dL (ref 1.7–2.4)

## 2017-04-10 MED ORDER — POTASSIUM CHLORIDE CRYS ER 20 MEQ PO TBCR
40.0000 meq | EXTENDED_RELEASE_TABLET | ORAL | Status: AC
  Administered 2017-04-10 (×2): 40 meq via ORAL
  Filled 2017-04-10 (×2): qty 2

## 2017-04-10 MED ORDER — HYDRALAZINE HCL 20 MG/ML IJ SOLN
5.0000 mg | INTRAMUSCULAR | Status: DC | PRN
  Administered 2017-04-11: 5 mg via INTRAVENOUS
  Filled 2017-04-10: qty 1

## 2017-04-10 MED ORDER — PANTOPRAZOLE SODIUM 20 MG PO TBEC
20.0000 mg | DELAYED_RELEASE_TABLET | Freq: Every day | ORAL | Status: DC
  Administered 2017-04-10 – 2017-04-12 (×3): 20 mg via ORAL
  Filled 2017-04-10 (×3): qty 1

## 2017-04-10 MED ORDER — PROMETHAZINE HCL 25 MG/ML IJ SOLN
6.2500 mg | Freq: Four times a day (QID) | INTRAMUSCULAR | Status: DC | PRN
  Administered 2017-04-10 – 2017-04-12 (×2): 6.25 mg via INTRAVENOUS
  Filled 2017-04-10 (×2): qty 1

## 2017-04-10 NOTE — Evaluation (Signed)
Physical Therapy Evaluation Patient Details Name: Charlotte Castro MRN: [id number] DOB: 03/23/1933 Today's Date: 04/10/2017   History of Present Illness  Charlotte Yepez Wilsonis a 83 y.o.femalewith medical history significant of A Fib, HTN, GERD who presents with nausea, vomiting, epigastric abdominal pain. Work up as revealed acute gallstone pancreatitis with lipase > 3000. She was admitted for further evaluation and treatment. GI and general surgery were consulted. Patient ultimately underwent laparoscopic cholecystectomy on 10/26.     Clinical Impression  Patient presents with problems listed below.  Will benefit from acute PT to maximize functional mobility prior to discharge.  Patient independent with cane pta.  Today patient requiring assist for mobility and gait, and has confusion impacting safety.  Recommend ST-SNF for continued therapy at d/c.    Follow Up Recommendations SNF;Supervision/Assistance - 24 hour    Equipment Recommendations  None recommended by PT    Recommendations for Other Services       Precautions / Restrictions Precautions Precautions: Fall Restrictions Weight Bearing Restrictions: No      Mobility  Bed Mobility Overal bed mobility: Needs Assistance Bed Mobility: Rolling;Sidelying to Sit;Sit to Supine Rolling: Min guard Sidelying to sit: Mod assist   Sit to supine: Min assist   General bed mobility comments: Verbal cues for technique.  Assist to raise trunk to sitting position.  Patient with fair sitting balance.  Assist to bring LE's onto bed to return to supine.  Transfers Overall transfer level: Needs assistance Equipment used: Rolling walker (2 wheeled) Transfers: Sit to/from Stand Sit to Stand: Min assist         General transfer comment: Verbal cues for hand placement.  Assist to rise to stance, and to maintain balance initially.  Ambulation/Gait Ambulation/Gait assistance: Min guard Ambulation Distance (Feet): 24 Feet Assistive  device: Rolling walker (2 wheeled) Gait Pattern/deviations: Step-through pattern;Decreased stride length;Shuffle;Trunk flexed Gait velocity: decreased Gait velocity interpretation: Below normal speed for age/gender General Gait Details: Patient with very slow gait.  Balance improved with use of RW.  Stairs            Wheelchair Mobility    Modified Rankin (Stroke Patients Only)       Balance Overall balance assessment: Needs assistance Sitting-balance support: No upper extremity supported;Feet supported Sitting balance-[name]: Fair     Standing balance support: Bilateral upper extremity supported Standing balance-[name]: Poor                               Pertinent Vitals/Pain Pain Assessment: Faces Faces Pain Scale: Hurts worst Pain Location: Lt side of head; Rt side of abdomen/ribs Pain Descriptors / Indicators: Aching;Headache;Grimacing Pain Intervention(s): Limited activity within patient's tolerance;Monitored during session;Repositioned    Home Living Family/patient expects to be discharged to:: Private residence Living Arrangements: Alone Available Help at Discharge: Family;Available PRN/intermittently Type of Home: House Home Access: Ramped entrance     Home Layout: One level Home Equipment: [place] - 4 wheels;Cane - single point;Bedside commode      Prior Function Level of Independence: Independent with assistive device(s)         Comments: Per patient, she was ambulating with cane, and independent with ADL's.     Hand Dominance        Extremity/Trunk Assessment   Upper Extremity Assessment Upper Extremity Assessment: Defer to OT evaluation    Lower Extremity Assessment Lower Extremity Assessment: Generalized weakness       Communication  Communication: No difficulties  Cognition Arousal/Alertness: Awake/alert Behavior During Therapy: Anxious Overall Cognitive Status: No family/caregiver present to determine  baseline cognitive functioning                                 General Comments: Patient A & O x3.  However, patient confused.  Talks about "people" who would not let her children see her.  A cat in the room. "Don't let them take things from you"       General Comments      Exercises     Assessment/Plan    PT Assessment Patient needs continued PT services  PT Problem List Decreased strength;Decreased activity tolerance;Decreased balance;Decreased mobility;Decreased cognition;Decreased knowledge of use of DME;Obesity;Pain       PT Treatment Interventions DME instruction;Gait training;Functional mobility training;Therapeutic activities;Therapeutic exercise;Patient/family education;Cognitive remediation    PT Goals (Current goals can be found in the Care Plan section)  Acute Rehab PT Goals Patient Stated Goal: To get stronger PT Goal Formulation: With patient Time For Goal Achievement: 04/17/17 Potential to Achieve Goals: Fair    Frequency Min 3X/week   Barriers to discharge Decreased caregiver support Patient lives alone    Co-evaluation               AM-PAC PT "6 Clicks" Daily Activity  Outcome Measure Difficulty turning over in bed (including adjusting bedclothes, sheets and blankets)?: A Little Difficulty moving from lying on back to sitting on the side of the bed? : Unable Difficulty sitting down on and standing up from a chair with arms (e.g., wheelchair, bedside commode, etc,.)?: A Little Help needed moving to and from a bed to chair (including a wheelchair)?: A Little Help needed walking in hospital room?: A Little Help needed climbing 3-5 steps with a railing? : A Lot 6 Click Score: 15    End of Session Equipment Utilized During Treatment: Gait belt Activity Tolerance: Patient limited by pain;Patient limited by fatigue Patient left: in bed;with call bell/phone within reach;with bed alarm set Nurse Communication: Mobility status PT Visit  Diagnosis: Unsteadiness on feet (R26.81);Other abnormalities of gait and mobility (R26.89);Muscle weakness (generalized) (M62.81);Pain Pain - Right/Left: Right Pain - part of body:  (abdomen/chest and Lt sided headache)    Time: 6720-9470 PT Time Calculation (min) (ACUTE ONLY): 22 min   Charges:   PT Evaluation $PT Eval Moderate Complexity: 1 Mod     PT G Codes:        Carita Pian. Sanjuana Kava, [facility] Acute Rehab Services Pager [place] 04/10/2017, 10:54 AM

## 2017-04-10 NOTE — Progress Notes (Signed)
Patient continues to be alert and oriented.  Complains of abdominal pain and nausea.  Voices greatest concern with nausea.  Allowed staff to insert IV for nausea medication.  No relief from nausea with IV Zofran.  Will notify MD of patients requests for additional anti emetic and IV pain medication.

## 2017-04-10 NOTE — Progress Notes (Signed)
[facility] PROGRESS NOTE Subjective patient was a little bit confused earlier this morning. She states to me now that she feels better and is actually a little bit hungry.  Objective: Vital signs in last 24 hours: Temp:  [98.6 F (37 C)-99.4 F (37.4 C)] 98.6 F (37 C) (10/27 2249) Pulse Rate:  [98-120] 98 (10/28 0600) Resp:  [18-20] 20 (10/28 0600) BP: (165-185)/(86-135) 185/95 (10/28 0600) SpO2:  [92 %-98 %] 92 % (10/28 0600) Weight:  [101.2 kg (223 lb)] 101.2 kg (223 lb) (10/28 0600) Last BM Date: 04/05/17  Intake/Output from previous day: 10/27 0701 - 10/28 0700 In: 1110 [P.O.:120; I.V.:990] Out: 1400 [Urine:1400] Intake/Output this shift: No intake/output data recorded.  PE: General-- alert sitting there eating someone check.  Heart-- Lungs-- Abdomen-- minimally tender  Lab Results:  Recent Labs  04/08/17 0637 04/09/17 0731 04/10/17 0507  WBC 8.3 11.3* 9.5  HGB 13.4 14.0 13.5  HCT 37.7 39.4 37.6  PLT 137* 156 156   BMET  Recent Labs  04/08/17 0637 04/09/17 0731 04/10/17 0507  NA 141 141 139  K 3.6 3.7 3.2*  CL 110 112* 106  CO2 24 19* 23  CREATININE 0.72 0.75 0.74   LFT  Recent Labs  04/08/17 0637 04/09/17 0731 04/10/17 0507  PROT 6.0* 6.4* 6.3*  AST 37 49* 33  ALT 61* 59* 47  ALKPHOS 73 82 81  BILITOT 1.6* 1.5* 1.7*  BILIDIR  --  0.4 0.5  IBILI  --  1.1* 1.2*   PT/INR  Recent Labs  04/08/17 0637 04/08/17 1135  LABPROT 21.6* 19.1*  INR 1.90 1.62   PANCREAS  Recent Labs  04/08/17 0637  LIPASE 64*         Studies/Results: No results found.  Medications: I have reviewed the patient's current medications.  Assessment:   1. Gallstone pancreatitis. Patient a couple days after laparoscopic cholecystectomy. Total bilirubin slightly but mostly indirect and other LFTs basically normal. She clinically feels better.   Plan: would go ahead and slowly advance her diet and if she tolerates would go ahead and  resume her anticoagulation. If she has a bump and LFTs or decompensate then we may need to consider evaluating her CBD. Hopefully we could do that with MRCP. Please call us back up we can be of any further help.   Paxton Binns JR,Breonna Gafford L 04/10/2017, 1:29 PM  This note was created using voice recognition software. Minor errors may Have occurred unintentionally.  Pager: [phone] If no answer or after hours call [phone]

## 2017-04-10 NOTE — Progress Notes (Signed)
PROGRESS NOTE    [name]  FXT:[id number] DOB: 20-Jun-1932 DOA: 04/05/2017 PCP: Leeroy Cha, MD     Brief Narrative:  Charlotte Castro is a 83 y.o. female with medical history significant of A Fib, HTN, GERD who presents with nausea, vomiting, epigastric abdominal pain. Work up as revealed acute gallstone pancreatitis with lipase > 3000. She was admitted for further evaluation and treatment. GI and general surgery were consulted. Patient ultimately underwent laparoscopic cholecystectomy on 10/26. Postop course complicated by confusion.   Assessment & Plan:   Principal Problem:   Acute gallstone pancreatitis Active Problems:   Permanent atrial fibrillation (HCC)   Essential hypertension, benign   Chronic diastolic CHF (congestive heart failure) (HCC)   Abdominal pain, acute, epigastric   Choledocholithiasis with obstruction   Persistent atrial fibrillation ([place])   Acute gallstone pancreatitis with choledocholithiasis  -GI and general surgery consulted -S/p lap chole 10/26  -LFT now normalized   Acute encephalopathy -She has been confused (although oriented to self, place, year) this morning, calling 911, asking for the police (does not tell me why she needs police), hallucinating, and not acting herself. This is apparently not new per family. Neuro exam was unremarkable. Discontinue narcotic pain medications, continue to re-orient patient.  -Slightly improved today, no longer agitated, but remains confused   Permanent A Fib -Lopressor -Resume coumadin when okay with GI if ERCP not planned   Hypothyroidism -Synthroid  Essential HTN -Lopressor, hydralazine prn   Chronic diastolic HF  -Euvolemic on exam   Hypokalemia -Replace, trend    DVT prophylaxis: Lovenox for ppx  Code Status: Full Family Communication: No family at bedside, updated son and daughter 10/27  Disposition Plan: Pending improvement    Consultants:   GI  General  surgery  Procedures:   None   Antimicrobials:  Anti-infectives    Start     Dose/Rate Route Frequency Ordered Stop   04/08/17 1309  ciprofloxacin (CIPRO) 400 MG/200ML IVPB  Status:  Discontinued    Comments:  Schonewitz, Leigh   : cabinet override      04/08/17 1309 04/08/17 1316   04/08/17 1000  ciprofloxacin (CIPRO) IVPB 400 mg     400 mg 200 mL/hr over 60 Minutes Intravenous On call to O.R. 04/07/17 2992 04/08/17 1405   04/05/17 2000  piperacillin-tazobactam (ZOSYN) IVPB 3.375 g  Status:  Discontinued     3.375 g 12.5 mL/hr over 240 Minutes Intravenous Every 8 hours 04/05/17 1220 04/06/17 1418   04/05/17 1215  piperacillin-tazobactam (ZOSYN) IVPB 3.375 g     3.375 g 100 mL/hr over 30 Minutes Intravenous  Once 04/05/17 1216 04/05/17 1447   04/05/17 1145  piperacillin-tazobactam (ZOSYN) IVPB 4.5 g  Status:  Discontinued     4.5 g 200 mL/hr over 30 Minutes Intravenous  Once 04/05/17 1137 04/05/17 1216       Subjective: Patient remains confused this morning, tells me that she needs to have a visit with people caring for her cats and that we are not allowing them to come in. Continues to complain of abdominal soreness and nausea.    Objective: Vitals:   04/09/17 1440 04/09/17 2249 04/10/17 0058 04/10/17 0600  BP: (!) 181/86 (!) 166/135 (!) 165/92 (!) 185/95  Pulse: (!) 120 (!) 107 99 98  Resp:  18  20  Temp: 99.4 F (37.4 C) 98.6 F (37 C)    TempSrc: Oral Oral    SpO2: 96% 98%  92%  Weight:    101.2 kg (  223 lb)  Height:        Intake/Output Summary (Last 24 hours) at 04/10/17 1127 Last data filed at 04/10/17 0626  Gross per 24 hour  Intake             1110 ml  Output             1400 ml  Net             -290 ml   Filed Weights   04/08/17 1316 04/09/17 0443 04/10/17 0600  Weight: 96.6 kg (213 lb) 99.8 kg (220 lb) 101.2 kg (223 lb)    Examination:  General exam: Appears confused  Respiratory system: Clear to auscultation. Respiratory effort  normal. Cardiovascular system: S1 & S2 heard, irregular rhythm. No JVD, murmurs, rubs, gallops or clicks. No pedal edema. Gastrointestinal system: Abdomen is nondistended, soft and minimally tender to palpation epigastric. No organomegaly or masses felt. Normal bowel sounds heard. Central nervous system: Alert and oriented, remains confused overall. Nonfocal exam, able to move all extremities appropriately, CN 2-12 grossly normal. Speech fluent.  Extremities: Symmetric  Skin: No rashes, lesions or ulcers Psychiatry: confused this morning   Data Reviewed: I have personally reviewed following labs and imaging studies  CBC:  Recent Labs Lab 04/06/17 0521 04/07/17 0448 04/08/17 0637 04/09/17 0731 04/10/17 0507  WBC 6.9 9.3 8.3 11.3* 9.5  NEUTROABS  --  7.1 6.0 9.2* 7.4  HGB 12.6 13.7 13.4 14.0 13.5  HCT 35.9* 39.6 37.7 39.4 37.6  MCV 93.7 93.2 93.1 91.8 91.5  PLT 126* 127* 137* 156 562   Basic Metabolic Panel:  Recent Labs Lab 04/06/17 0521 04/07/17 0448 04/08/17 0637 04/09/17 0731 04/10/17 0507 04/10/17 0905  NA 140 141 141 141 139  --   K 3.8 3.8 3.6 3.7 3.2*  --   CL 111 108 110 112* 106  --   CO2 25 25 24  19* 23  --   GLUCOSE 82 95 90 110* 101*  --   BUN 12 8 6 8 11   --   CREATININE 0.97 0.80 0.72 0.75 0.74  --   CALCIUM 8.3* 8.7* 8.6* 8.6* 8.7*  --   MG  --   --   --   --   --  1.7   GFR: Estimated Creatinine Clearance: 67.4 mL/min (by C-G formula based on SCr of 0.74 mg/dL). Liver Function Tests:  Recent Labs Lab 04/06/17 0521 04/07/17 0448 04/08/17 0637 04/09/17 0731 04/10/17 0507  AST 129* 72* 37 49* 33  ALT 123* 93* 61* 59* 47  ALKPHOS 78 78 73 82 81  BILITOT 2.0* 1.4* 1.6* 1.5* 1.7*  PROT 5.4* 6.1* 6.0* 6.4* 6.3*  ALBUMIN 2.8* 3.2* 3.0* 3.0* 2.9*    Recent Labs Lab 04/05/17 1010 04/06/17 0521 04/07/17 0448 04/08/17 0637  LIPASE 3,514* 364* 76* 64*   No results for input(s): AMMONIA in the last 168 hours. Coagulation Profile:  Recent  Labs Lab 04/05/17 1743 04/07/17 0448 04/08/17 0637 04/08/17 1135  INR 2.07 1.98 1.90 1.62   Cardiac Enzymes: No results for input(s): CKTOTAL, CKMB, CKMBINDEX, TROPONINI in the last 168 hours. BNP (last 3 results) No results for input(s): PROBNP in the last 8760 hours. HbA1C: No results for input(s): HGBA1C in the last 72 hours. CBG: No results for input(s): GLUCAP in the last 168 hours. Lipid Profile: No results for input(s): CHOL, HDL, LDLCALC, TRIG, CHOLHDL, LDLDIRECT in the last 72 hours. Thyroid Function Tests: No results for input(s): TSH, T4TOTAL, FREET4,  T3FREE, THYROIDAB in the last 72 hours. Anemia Panel: No results for input(s): VITAMINB12, FOLATE, FERRITIN, TIBC, IRON, RETICCTPCT in the last 72 hours. Sepsis Labs:  Recent Labs Lab 04/05/17 1019 04/05/17 1233 04/05/17 1743 04/05/17 2025  LATICACIDVEN 2.51* 3.18* 1.3 0.9    Recent Results (from the past 240 hour(s))  Blood Culture (routine x 2)     Status: None (Preliminary result)   Collection Time: 04/05/17 11:34 AM  Result Value Ref Range Status   Specimen Description BLOOD RIGHT ANTECUBITAL  Final   Special Requests   Final    BOTTLES DRAWN AEROBIC AND ANAEROBIC Blood Culture results may not be optimal due to an excessive volume of blood received in culture bottles   Culture NO GROWTH 4 DAYS  Final   Report Status PENDING  Incomplete  Blood Culture (routine x 2)     Status: None (Preliminary result)   Collection Time: 04/05/17 11:51 AM  Result Value Ref Range Status   Specimen Description BLOOD LEFT FOREARM  Final   Special Requests   Final    BOTTLES DRAWN AEROBIC AND ANAEROBIC Blood Culture adequate volume   Culture NO GROWTH 4 DAYS  Final   Report Status PENDING  Incomplete  Surgical PCR screen     Status: None   Collection Time: 04/07/17 10:31 PM  Result Value Ref Range Status   MRSA, PCR NEGATIVE NEGATIVE Final   Staphylococcus aureus NEGATIVE NEGATIVE Final    Comment: (NOTE) The Xpert SA  Assay (FDA approved for NASAL specimens in patients 27 years of age and older), is one component of a comprehensive surveillance program. It is not intended to diagnose infection nor to guide or monitor treatment.        Radiology Studies: No results found.    Scheduled Meds: . acetaminophen  650 mg Oral Q6H  . clonazePAM  0.25-0.5 mg Oral BID  . enoxaparin (LOVENOX) injection  40 mg Subcutaneous Q24H  . feeding supplement  1 Container Oral TID BM  . levothyroxine  88 mcg Oral QAC breakfast  . metoprolol tartrate  50 mg Oral BID  . multivitamin with minerals  1 tablet Oral Daily  . potassium chloride  40 mEq Oral Q4H   Continuous Infusions: . sodium chloride Stopped (04/09/17 1930)     LOS: 5 days    Time spent: 30 minutes   Dessa Phi, DO [facility] [email or web address] Password [email or web address] 04/10/2017, 11:27 AM

## 2017-04-10 NOTE — Evaluation (Signed)
Occupational Therapy Evaluation Patient Details Name: Charlotte Castro MRN: [id number] DOB: [birth year] Today's Date: 04/10/2017    History of Present Illness Lawren Sexson Wilsonis a 83 y.o.femalewith medical history significant of A Fib, HTN, GERD who presents with nausea, vomiting, epigastric abdominal pain. Work up as revealed acute gallstone pancreatitis with lipase > 3000. She was admitted for further evaluation and treatment. GI and general surgery were consulted. Patient ultimately underwent laparoscopic cholecystectomy on 10/26.   Clinical Impression   Pt reports she was mod I with ADL PTA. Currently pt overall min assist for ADL and functional mobility. Pt presenting with impaired balance, generalized weakness, impaired cognition, pain, and decreased activity tolerance impacting her independence and safety with ADL and functional mobility. Recommending SNF for follow up to maximize independence and safety with ADL and functional mobility prior to return home alone. Pt would benefit from continued skilled OT to address established goals.    Follow Up Recommendations  SNF;Supervision/Assistance - 24 hour    Equipment Recommendations  Other (comment) (TBD at next venue)    Recommendations for Other Services       Precautions / Restrictions Precautions Precautions: Fall Restrictions Weight Bearing Restrictions: No      Mobility Bed Mobility Overal bed mobility: Needs Assistance Bed Mobility: Supine to Sit;Sit to Supine  Supine to sit: Mod assist Sit to supine: Min assist   General bed mobility comments: Assist for trunk elevation to sitting. Min assist for LEs back to bed  Transfers Overall transfer level: Needs assistance Equipment used: Rolling walker (2 wheeled) Transfers: Sit to/from Stand Sit to Stand: Min assist         General transfer comment: Min assist to boost up from EOB and for steadying balance in standing. Cues for hand placement    Balance Overall  balance assessment: Needs assistance Sitting-balance support: Feet supported;No upper extremity supported Sitting balance-[name]: Fair     Standing balance support: No upper extremity supported;During functional activity Standing balance-[name]: Fair Standing balance comment: Able to stand at sink and wash hands without UE support                           ADL either performed or assessed with clinical judgement   ADL Overall ADL's : Needs assistance/impaired Eating/Feeding: Set up;Sitting   Grooming: Min guard;Standing;Wash/dry hands   Upper Body Bathing: Set up;Supervision/ safety;Sitting   Lower Body Bathing: Minimal assistance;Sit to/from stand   Upper Body Dressing : Set up;Supervision/safety;Sitting   Lower Body Dressing: Minimal assistance;Sit to/from stand   Toilet Transfer: Minimal assistance;Ambulation;RW;BSC Toilet Transfer Details (indicate cue type and reason): Simulated by sit to stand from EOB with functional mobility in room         Functional mobility during ADLs: Minimal assistance;Rolling walker       Vision         Perception     Praxis      Pertinent Vitals/Pain Pain Assessment: Faces Faces Pain Scale: Hurts little more Pain Location: abdomen Pain Descriptors / Indicators: Sore Pain Intervention(s): Monitored during session     Hand Dominance     Extremity/Trunk Assessment Upper Extremity Assessment Upper Extremity Assessment: Generalized weakness   Lower Extremity Assessment Lower Extremity Assessment: Defer to PT evaluation       Communication Communication Communication: No difficulties   Cognition Arousal/Alertness: Awake/alert Behavior During Therapy: WFL for tasks assessed/performed Overall Cognitive Status: No family/caregiver present to determine baseline cognitive functioning  General Comments       Exercises     Shoulder Instructions       Home Living Family/patient expects to be discharged to:: Private residence Living Arrangements: Alone Available Help at Discharge: Family;Available PRN/intermittently Type of Home: House Home Access: Ramped entrance     Home Layout: One level     Bathroom Shower/Tub: Other (comment) (only sponge bathes)   Bathroom Toilet: Standard     Home Equipment: Walker - 4 wheels;Cane - single point;Bedside commode          Prior Functioning/Environment Level of Independence: Independent with assistive device(s)        Comments: Per patient, she was ambulating with cane, and independent with ADL's. Only sponge bathing        OT Problem List: Decreased strength;Decreased activity tolerance;Impaired balance (sitting and/or standing);Decreased cognition;Decreased safety awareness;Decreased knowledge of use of DME or AE;Obesity;Pain;Increased edema      OT Treatment/Interventions: Self-care/ADL training;Therapeutic exercise;Energy conservation;DME and/or AE instruction;Therapeutic activities;Cognitive remediation/compensation;Patient/family education;Balance training    OT Goals(Current goals can be found in the care plan section) Acute Rehab OT Goals Patient Stated Goal: To get stronger OT Goal Formulation: With patient Time For Goal Achievement: 04/24/17 Potential to Achieve Goals: Good ADL Goals Pt Will Perform Grooming: with supervision;standing Pt Will Perform Lower Body Bathing: with supervision;sit to/from stand Pt Will Perform Lower Body Dressing: with supervision;sit to/from stand Pt Will Transfer to Toilet: with supervision;ambulating;bedside commode Pt Will Perform Toileting - Clothing Manipulation and hygiene: with supervision;sit to/from stand  OT Frequency: Min 2X/week   Barriers to D/C: Decreased caregiver support  pt lives alone       Co-evaluation              AM-PAC PT "6 Clicks" Daily Activity     Outcome Measure Help from another person eating  meals?: None Help from another person taking care of personal grooming?: A Little Help from another person toileting, which includes using toliet, bedpan, or urinal?: A Little Help from another person bathing (including washing, rinsing, drying)?: A Lot Help from another person to put on and taking off regular upper body clothing?: A Little Help from another person to put on and taking off regular lower body clothing?: A Lot 6 Click Score: 17   End of Session Equipment Utilized During Treatment: Rolling walker  Activity Tolerance: Patient tolerated treatment well Patient left: in bed;with call bell/phone within reach;with bed alarm set  OT Visit Diagnosis: Other abnormalities of gait and mobility (R26.89);Unsteadiness on feet (R26.81);Pain Pain - part of body:  (abdomen)                Time: 2956-2130 OT Time Calculation (min): 18 min Charges:  OT General Charges $OT Visit: 1 Visit OT Evaluation $OT Eval Moderate Complexity: 1 Mod G-Codes:     Shafer Swamy A. Ulice Brilliant, M.S., OTR/L Pager: [place] 04/10/2017, 12:57 PM

## 2017-04-10 NOTE — Progress Notes (Signed)
[facility] Progress Note  2 Days Post-Op  Subjective: CC:  Nauseated overnight and reports mild nausea this morning. Denies a history of GERD/heartburn, though it is listed in her chart under medical history. Also c/o HA. Denies abdominal pain. Patient remains confused this morning but oriented to person/place/time. Reports decreased appetite.   Hypertensive this AM but has since received her PO antihypertensive meds. Afebrile.  Objective: Vital signs in last 24 hours: Temp:  [98.6 F (37 C)-99.4 F (37.4 C)] 98.6 F (37 C) (10/27 2249) Pulse Rate:  [98-120] 98 (10/28 0600) Resp:  [18-20] 20 (10/28 0600) BP: (165-185)/(86-135) 185/95 (10/28 0600) SpO2:  [92 %-98 %] 92 % (10/28 0600) Weight:  [101.2 kg (223 lb)] 101.2 kg (223 lb) (10/28 0600) Last BM Date: 04/05/17  Intake/Output from previous day: 10/27 0701 - 10/28 0700 In: 1110 [P.O.:120; I.V.:990] Out: 1400 [Urine:1400] Intake/Output this shift: No intake/output data recorded.  PE: Gen:  Alert, NAD, pleasant Card:  Regular rate and rhythm, pedal pulses 2+ BL Pulm:  Normal effort, clear to auscultation bilaterally Abd: Soft, appropriately tender, non-distended, bowel sounds present in all 4 quadrants, incisions C/D/I Skin: warm and dry, no rashes  Psych: A&Ox3   Lab Results:   Recent Labs  04/09/17 0731 04/10/17 0507  WBC 11.3* 9.5  HGB 14.0 13.5  HCT 39.4 37.6  PLT 156 156   BMET  Recent Labs  04/09/17 0731 04/10/17 0507  NA 141 139  K 3.7 3.2*  CL 112* 106  CO2 19* 23  GLUCOSE 110* 101*  BUN 8 11  CREATININE 0.75 0.74  CALCIUM 8.6* 8.7*   PT/INR  Recent Labs  04/08/17 0637 04/08/17 1135  LABPROT 21.6* 19.1*  INR 1.90 1.62   CMP     Component Value Date/Time   NA 139 04/10/2017 0507   K 3.2 (L) 04/10/2017 0507   CL 106 04/10/2017 0507   CO2 23 04/10/2017 0507   GLUCOSE 101 (H) 04/10/2017 0507   BUN 11 04/10/2017 0507   CREATININE 0.74 04/10/2017 0507   CALCIUM 8.7 (L)  04/10/2017 0507   PROT 6.3 (L) 04/10/2017 0507   ALBUMIN 2.9 (L) 04/10/2017 0507   AST 33 04/10/2017 0507   ALT 47 04/10/2017 0507   ALKPHOS 81 04/10/2017 0507   BILITOT 1.7 (H) 04/10/2017 0507   GFRNONAA >60 04/10/2017 0507   GFRAA >60 04/10/2017 0507   Lipase     Component Value Date/Time   LIPASE 64 (H) 04/08/2017 1610       Studies/Results: No results found.  Anti-infectives: Anti-infectives    Start     Dose/Rate Route Frequency Ordered Stop   04/08/17 1309  ciprofloxacin (CIPRO) 400 MG/200ML IVPB  Status:  Discontinued    Comments:  Schonewitz, Leigh   : cabinet override      04/08/17 1309 04/08/17 1316   04/08/17 1000  ciprofloxacin (CIPRO) IVPB 400 mg     400 mg 200 mL/hr over 60 Minutes Intravenous On call to O.R. 04/07/17 0812 04/08/17 1405   04/05/17 2000  piperacillin-tazobactam (ZOSYN) IVPB 3.375 g  Status:  Discontinued     3.375 g 12.5 mL/hr over 240 Minutes Intravenous Every 8 hours 04/05/17 1220 04/06/17 1418   04/05/17 1215  piperacillin-tazobactam (ZOSYN) IVPB 3.375 g     3.375 g 100 mL/hr over 30 Minutes Intravenous  Once 04/05/17 1216 04/05/17 1447   04/05/17 1145  piperacillin-tazobactam (ZOSYN) IVPB 4.5 g  Status:  Discontinued     4.5 g 200  mL/hr over 30 Minutes Intravenous  Once 04/05/17 1137 04/05/17 1216     Assessment/Plan Biliary pancreatitis POD#2 S/p laparoscopic cholecystectomy 10/26 Dr. Georganna Skeans             - afebrile, VSS, WBC WNL             - total bilirubin 1.7 this AM from 1.5, but mostly indirect bili so likely not 2/2 retained CBD stone.             - tolerating clear liquids but having poor oral intake and intermittent nausea.             - pain control and mobilize, agree with holding narcotic pain meds due to patient confusion.   FEN: Clear liquids; hypokalemia (3.2) replace. Consider checking Mg. ID: perioperative cipro 10/26 VTE: SCD's, stable for chemical VTE prophylaxis/resumption of anticoagulatuion from  surgical perspective.   Plan: antiemetics. Could consider PPI/H2 blocker to assist in treatment of nausea/GERD.  Advance diet to low fat as tolerated. If GI is not planning ERCP then it is ok from surgical perspective to resume anticoagulation. PT/OT evals.     LOS: 5 days    [facility] 04/10/2017, 7:50 AM Pager: [phone] Consults: [phone] Mon-Fri 7:00 am-4:30 pm Sat-Sun 7:00 am-11:30 am

## 2017-04-11 LAB — BASIC METABOLIC PANEL
Anion gap: 6 (ref 5–15)
BUN: 12 mg/dL (ref 6–20)
CHLORIDE: 109 mmol/L (ref 101–111)
CO2: 25 mmol/L (ref 22–32)
CREATININE: 0.74 mg/dL (ref 0.44–1.00)
Calcium: 8.8 mg/dL — ABNORMAL LOW (ref 8.9–10.3)
GFR calc Af Amer: >60 mL/min (ref >60)
GFR calc non Af Amer: >60 mL/min (ref >60)
Glucose, Bld: 95 mg/dL (ref 65–99)
POTASSIUM: 4 mmol/L (ref 3.5–5.1)
Sodium: 140 mmol/L (ref 135–145)

## 2017-04-11 LAB — CBC WITH DIFFERENTIAL/PLATELET
Basophils Absolute: 0 10*3/uL (ref 0.0–0.1)
Basophils Relative: 1 %
Eosinophils Absolute: 0.3 10*3/uL (ref 0.0–0.7)
Eosinophils Relative: 4 %
HEMATOCRIT: 37.5 % (ref 36.0–46.0)
HEMOGLOBIN: 12.8 g/dL (ref 12.0–15.0)
LYMPHS ABS: 1.7 10*3/uL (ref 0.7–4.0)
LYMPHS PCT: 26 %
MCH: 31.8 pg (ref 26.0–34.0)
MCHC: 34.1 g/dL (ref 30.0–36.0)
MCV: 93.3 fL (ref 78.0–100.0)
MONOS PCT: 9 %
Monocytes Absolute: 0.6 10*3/uL (ref 0.1–1.0)
NEUTROS PCT: 60 %
Neutro Abs: 4.1 10*3/uL (ref 1.7–7.7)
Platelets: 173 10*3/uL (ref 150–400)
RBC: 4.02 MIL/uL (ref 3.87–5.11)
RDW: 13.6 % (ref 11.5–15.5)
WBC: 6.7 10*3/uL (ref 4.0–10.5)

## 2017-04-11 LAB — PREPARE FRESH FROZEN PLASMA: Unit division: 0

## 2017-04-11 LAB — HEPATIC FUNCTION PANEL
ALBUMIN: 2.7 g/dL — AB (ref 3.5–5.0)
ALK PHOS: 72 U/L (ref 38–126)
ALT: 33 U/L (ref 14–54)
AST: 21 U/L (ref 15–41)
BILIRUBIN INDIRECT: 0.8 mg/dL (ref 0.3–0.9)
Bilirubin, Direct: 0.3 mg/dL (ref 0.1–0.5)
Total Bilirubin: 1.1 mg/dL (ref 0.3–1.2)
Total Protein: 6 g/dL — ABNORMAL LOW (ref 6.5–8.1)

## 2017-04-11 LAB — BPAM FFP
Blood Product Expiration Date: 201810312359
ISSUE DATE / TIME: 201810291329
Unit Type and Rh: 6200

## 2017-04-11 LAB — GLUCOSE, CAPILLARY: GLUCOSE-CAPILLARY: 112 mg/dL — AB (ref 65–99)

## 2017-04-11 MED ORDER — WARFARIN - PHARMACIST DOSING INPATIENT: Freq: Every day | Status: DC

## 2017-04-11 MED ORDER — ENOXAPARIN SODIUM 100 MG/ML ~~LOC~~ SOLN
100.0000 mg | Freq: Two times a day (BID) | SUBCUTANEOUS | Status: DC
  Administered 2017-04-11 (×2): 100 mg via SUBCUTANEOUS
  Filled 2017-04-11 (×3): qty 1

## 2017-04-11 MED ORDER — WARFARIN SODIUM 7.5 MG PO TABS
7.5000 mg | ORAL_TABLET | Freq: Once | ORAL | Status: AC
  Administered 2017-04-11: 7.5 mg via ORAL
  Filled 2017-04-11: qty 1

## 2017-04-11 NOTE — Progress Notes (Signed)
ANTICOAGULATION CONSULT NOTE - Initial Consult  Pharmacy Consult for Lovenox >> Coumadin Indication: atrial fibrillation  Allergies  Allergen Reactions  . Iodinated Diagnostic Agents Shortness Of Breath    headache  . Penicillins Other (See Comments)    Tolerated Zosyn Oct 2018    Patient Measurements: Height: 5\' 8"  (172.7 cm) Weight: 222 lb (100.7 kg) IBW/kg (Calculated) : 63.9  Vital Signs: Temp: 97.8 F (36.6 C) (10/29 0531) Temp Source: Oral (10/29 0531) BP: 126/76 (10/29 1021) Pulse Rate: 95 (10/29 1021)  Labs:  Recent Labs  04/09/17 0731 04/10/17 0507 04/11/17 0710  HGB 14.0 13.5 12.8  HCT 39.4 37.6 37.5  PLT 156 156 173  CREATININE 0.75 0.74 0.74    Estimated Creatinine Clearance: 67.3 mL/min (by C-G formula based on SCr of 0.74 mg/dL).   Medical History: Past Medical History:  Diagnosis Date  . Anxiety   . Arthritis    "left knee" (04/05/2017)  . Atrial fibrillation ([place])   . Atrial flutter ([place])    typical appearing  . Atrial tachycardia ([place])    ablated 11/17/10  by JA  from the [facility] of the aorta  . Chronic lower back pain   . Dizziness    chronic and of an unclear etiology  . Gastritis   . GERD (gastroesophageal reflux disease)   . History of blood transfusion ~ 1948  . HTN (hypertension)   . Hyperlipemia   . Hypothyroidism   . Obesity   . Osteoporosis   . Sinus headache   . Vitamin D deficiency     Medications:  Scheduled:  . acetaminophen  650 mg Oral Q6H  . clonazePAM  0.25-0.5 mg Oral BID  . feeding supplement  1 Container Oral TID BM  . levothyroxine  88 mcg Oral QAC breakfast  . metoprolol tartrate  50 mg Oral BID  . multivitamin with minerals  1 tablet Oral Daily  . pantoprazole  20 mg Oral Daily    Assessment: 83 yo F on Coumadin PTA for hx of Afib.  Anticoagulation was held on admission for lap-chole on 10/26.  Asked by MD to restart anticoagulation 10/29 with Coumadin and Lovenox bridging until INR therapeutic.  Of  note, last INR was 1.62 on 10/26 following 10mg  IV Vit K.  I highly suspect patient's INR remains <2 given no further Coumadin doses.  Can confidently begin treatment dose Lovenox and Coumadin.  Goal of Therapy:  INR 2-3 Anti-Xa level 0.6-1 units/ml 4hrs after LMWH dose given Monitor platelets by anticoagulation protocol: Yes   Plan:  Coumadin 7.5 mg PO x 1 tonight Lovenox 100mg  SQ q12h Daily INR CBC q72 hours  [employer], Pharm.D., BCPS Clinical Pharmacist Pager: [phone] Clinical phone for 04/11/2017 from 8:30-4:00 is [phone]. After 4pm, please call Main Rx [phone]) for assistance. 04/11/2017 12:24 PM

## 2017-04-11 NOTE — NC FL2 (Signed)
[place] LEVEL OF CARE SCREENING TOOL     IDENTIFICATION  Patient Name: Charlotte Castro Birthdate: August 17, 1932 Sex: female Admission Date (Current Location): 04/05/2017  [facility] and [place] Number:  Herbalist and Address:  [facility] [place], [place] [address] [place], [place] [address]      Provider Number: [id number]  Attending Physician Name and Address:  Dessa Phi Chahn-Yan*  Relative Name and Phone Number:       Current Level of Care: Hospital Recommended Level of Care: [place] Prior Approval Number:    Date Approved/Denied:   PASRR Number: [id number]  Discharge Plan: SNF    Current Diagnoses: Patient Active Problem List   Diagnosis Date Noted  . Persistent atrial fibrillation ([place])   . Acute gallstone pancreatitis 04/05/2017  . Abdominal pain, acute, epigastric 04/05/2017  . Choledocholithiasis with obstruction 04/05/2017  . Acute delirium 04/14/2016  . Tremor 04/14/2016  . Weakness 04/10/2016  . Elevated CK 04/10/2016  . Chronic diastolic CHF (congestive heart failure) ([place]) 04/10/2016  . UTI (urinary tract infection) 04/09/2016  . Dehydration   . Fatigue 03/29/2014  . Essential hypertension, benign 01/18/2014  . Long term current use of anticoagulant therapy 01/18/2014  . Encounter for therapeutic drug monitoring 07/13/2013  . Bradycardia 09/16/2012  . Permanent atrial fibrillation ([place]) 06/10/2011  . Dizziness 12/24/2010    Orientation RESPIRATION BLADDER Height & Weight     Self, Time, Situation, Place  Normal Incontinent, External catheter Weight: 222 lb (100.7 kg) Height:  5\' 8"  (172.7 cm)  BEHAVIORAL SYMPTOMS/MOOD NEUROLOGICAL BOWEL NUTRITION STATUS      Continent Diet (see DC summary)  AMBULATORY STATUS COMMUNICATION OF NEEDS Skin   Limited Assist Verbally Other (Comment) (abdominal wound- liquid skin adhesive)                       Personal Care Assistance Level of  Assistance  Bathing, Dressing Bathing Assistance: Limited assistance   Dressing Assistance: Limited assistance     Functional Limitations Info             SPECIAL CARE FACTORS FREQUENCY  PT (By licensed PT), OT (By licensed OT)     PT Frequency: 5/wk OT Frequency: 5/wk            Contractures      Additional Factors Info  Code Status, Allergies, Isolation Precautions, Psychotropic Code Status Info: FULL Allergies Info: Iodinated Diagnostic Agents, Penicillins Psychotropic Info: klonopin   Isolation Precautions Info: ESBL     Current Medications (04/11/2017):  This is the current hospital active medication list Current Facility-Administered Medications  Medication Dose Route Frequency Provider Last Rate Last Dose  . acetaminophen (TYLENOL) tablet 650 mg  650 mg Oral Q6H Jill Alexanders, PA-C   650 mg at 04/11/17 0556  . clonazePAM (KLONOPIN) tablet 0.25-0.5 mg  0.25-0.5 mg Oral BID Derrill Kay A, MD   0.5 mg at 04/10/17 2203  . enoxaparin (LOVENOX) injection 40 mg  40 mg Subcutaneous Q24H Dessa Phi Chahn-Yang, DO   40 mg at 04/10/17 1412  . feeding supplement (BOOST / RESOURCE BREEZE) liquid 1 Container  1 Container Oral TID BM Dessa Phi Chahn-Yang, DO   1 Container at 04/10/17 2017  . haloperidol lactate (HALDOL) injection 2 mg  2 mg Intramuscular Q6H PRN Dessa Phi Chahn-Yang, DO      . hydrALAZINE (APRESOLINE) injection 5 mg  5 mg Intravenous Q4H PRN Dessa Phi Chahn-Yang, DO  5 mg at 04/11/17 0007  . levothyroxine (SYNTHROID, LEVOTHROID) tablet 88 mcg  88 mcg Oral QAC breakfast Dessa Phi Chahn-Yang, DO   88 mcg at 04/11/17 8016  . metoprolol tartrate (LOPRESSOR) tablet 50 mg  50 mg Oral BID Phillips Grout, MD   50 mg at 04/10/17 2203  . multivitamin with minerals tablet 1 tablet  1 tablet Oral Daily Dessa Phi Chahn-Yang, DO   1 tablet at 04/10/17 1120  . MUSCLE RUB CREA   Topical PRN Dessa Phi Chahn-Yang, DO      . ondansetron  [facility]) tablet 4 mg  4 mg Oral Q6H PRN Phillips Grout, MD   4 mg at 04/07/17 0309   Or  . ondansetron [facility]) injection 4 mg  4 mg Intravenous Q6H PRN Phillips Grout, MD   4 mg at 04/10/17 0246  . pantoprazole (PROTONIX) EC tablet 20 mg  20 mg Oral Daily Dessa Phi Chahn-Yang, DO   20 mg at 04/10/17 1412  . promethazine (PHENERGAN) injection 6.25 mg  6.25 mg Intravenous Q6H PRN Jani Gravel, MD   6.25 mg at 04/10/17 0408  . traMADol (ULTRAM) tablet 50 mg  50 mg Oral Q8H PRN Jill Alexanders, PA-C         Discharge Medications: Please see discharge summary for a list of discharge medications.  Relevant Imaging Results:  Relevant Lab Results:   Additional Information SS#: [id number]  Jorge Ny, LCSW

## 2017-04-11 NOTE — Progress Notes (Signed)
Physical Therapy Treatment Patient Details Name: Charlotte Castro MRN: [id number] DOB: Nov 02, 1932 Today's Date: 04/11/2017    History of Present Illness Charlotte Mcmeans Wilsonis a 83 y.o.femalewith medical history significant of A Fib, HTN, GERD who presents with nausea, vomiting, epigastric abdominal pain. Work up as revealed acute gallstone pancreatitis with lipase > 3000. She was admitted for further evaluation and treatment. GI and general surgery were consulted. Patient ultimately underwent laparoscopic cholecystectomy on 10/26.    PT Comments    Pt is progressing well with gait and mobility, requiring min assist overall.  She doesn't like the idea of SNF for rehab as she did not have a good experience where she went in the past.  Her son is only available intermittently at discharge.  SNF level rehab is still appropriate .    Follow Up Recommendations  SNF;Supervision/Assistance - 24 hour     Equipment Recommendations  None recommended by PT    Recommendations for Other Services   NA     Precautions / Restrictions Precautions Precautions: Fall Precaution Comments: mildly unsteady on feet requiring RW for now, was no AD PTA    Mobility  Bed Mobility Overal bed mobility: Needs Assistance Bed Mobility: Supine to Sit     Supine to sit: Min assist     General bed mobility comments: Min assist to help support trunk to get out of compliant bed.   Transfers Overall transfer level: Needs assistance Equipment used: Rolling walker (2 wheeled) Transfers: Sit to/from Stand Sit to Stand: Min assist         General transfer comment: Min assist to support trunk during transitions, especially to sit down, uncontrolled descent.   Ambulation/Gait Ambulation/Gait assistance: Min guard Ambulation Distance (Feet): 150 Feet Assistive device: Rolling walker (2 wheeled) Gait Pattern/deviations: Step-through pattern;Trunk flexed Gait velocity: decreased Gait velocity interpretation:  Below normal speed for age/gender General Gait Details: Mild trunk flexion, slower speed.  Min guard assist for safety and balance.          Balance Overall balance assessment: Needs assistance Sitting-balance support: Feet supported;No upper extremity supported Sitting balance-[name]: Good     Standing balance support: Bilateral upper extremity supported;No upper extremity supported;Single extremity supported Standing balance-[name]: Fair                              Cognition Arousal/Alertness: Awake/alert Behavior During Therapy: WFL for tasks assessed/performed Overall Cognitive Status: Within Functional Limits for tasks assessed                                 General Comments: Not specifically tested, but conversation WNL      Exercises General Exercises - Lower Extremity Ankle Circles/Pumps: AROM;Both;20 reps Long Arc Quad: AROM;Both;10 reps Hip Flexion/Marching: AROM;Both;10 reps Heel Raises: AROM;Both;10 reps    General Comments        Pertinent Vitals/Pain Pain Assessment: Faces Faces Pain Scale: Hurts little more Pain Location: abdomen Pain Descriptors / Indicators: Sore Pain Intervention(s): Limited activity within patient's tolerance;Monitored during session;Repositioned           PT Goals (current goals can now be found in the care plan section) Acute Rehab PT Goals Patient Stated Goal: get stronger and go home Progress towards PT goals: Progressing toward goals    Frequency    Min 3X/week      PT Plan Current plan  remains appropriate       AM-PAC PT "6 Clicks" Daily Activity  Outcome Measure  Difficulty turning over in bed (including adjusting bedclothes, sheets and blankets)?: A Little Difficulty moving from lying on back to sitting on the side of the bed? : Unable Difficulty sitting down on and standing up from a chair with arms (e.g., wheelchair, bedside commode, etc,.)?: Unable Help needed  moving to and from a bed to chair (including a wheelchair)?: A Little Help needed walking in hospital room?: A Little Help needed climbing 3-5 steps with a railing? : A Little 6 Click Score: 14    End of Session   Activity Tolerance: Patient limited by fatigue Patient left: in chair;with call bell/phone within reach   PT Visit Diagnosis: Unsteadiness on feet (R26.81);Other abnormalities of gait and mobility (R26.89);Muscle weakness (generalized) (M62.81);Pain Pain - Right/Left: Right Pain - part of body:  (abdomen)     Time: 3838-1840 PT Time Calculation (min) (ACUTE ONLY): 19 min  Charges:  $Gait Training: 8-22 mins     Parag Dorton B. [place], [place], DPT [phone]           04/11/2017, 4:55 PM

## 2017-04-11 NOTE — Progress Notes (Signed)
[facility] Progress Note  3 Days Post-Op  Subjective: CC:  Confusion improved this AM. Reports loose BMs. Tolerating PO and reports nausea is improved. Still with poor oral intake. Abdominal soreness improving. mobilized with OT yesterday.  Objective: Vital signs in last 24 hours: Temp:  [97.6 F (36.4 C)-98.2 F (36.8 C)] 97.6 F (36.4 C) (10/28 2117) Pulse Rate:  [87-103] 103 (10/28 2352) Resp:  [18-20] 18 (10/28 2117) BP: (159-175)/(82-89) 160/89 (10/28 2352) SpO2:  [96 %-98 %] 98 % (10/28 2117) Weight:  [100.7 kg (222 lb)] 100.7 kg (222 lb) (10/29 0500) Last BM Date: 04/10/17  Intake/Output from previous day: 10/28 0701 - 10/29 0700 In: 600 [P.O.:600] Out: 1000 [Urine:1000] Intake/Output this shift: Total I/O In: 236 [P.O.:236] Out: -   PE: Gen:  Alert, NAD, pleasant Pulm:  Normal effort Abd: Soft, appropriately tender, non-distended, bowel sounds present in all 4 quadrants, incisions C/D/I Skin: warm and dry, no rashes  Psych: A&Ox3   Lab Results:   Recent Labs  04/10/17 0507 04/11/17 0710  WBC 9.5 6.7  HGB 13.5 12.8  HCT 37.6 37.5  PLT 156 173   BMET  Recent Labs  04/10/17 0507 04/11/17 0710  NA 139 140  K 3.2* 4.0  CL 106 109  CO2 23 25  GLUCOSE 101* 95  BUN 11 12  CREATININE 0.74 0.74  CALCIUM 8.7* 8.8*   PT/INR  Recent Labs  04/08/17 1135  LABPROT 19.1*  INR 1.62   CMP     Component Value Date/Time   NA 140 04/11/2017 0710   K 4.0 04/11/2017 0710   CL 109 04/11/2017 0710   CO2 25 04/11/2017 0710   GLUCOSE 95 04/11/2017 0710   BUN 12 04/11/2017 0710   CREATININE 0.74 04/11/2017 0710   CALCIUM 8.8 (L) 04/11/2017 0710   PROT 6.0 (L) 04/11/2017 0733   ALBUMIN 2.7 (L) 04/11/2017 0733   AST 21 04/11/2017 0733   ALT 33 04/11/2017 0733   ALKPHOS 72 04/11/2017 0733   BILITOT 1.1 04/11/2017 0733   GFRNONAA >60 04/11/2017 0710   GFRAA >60 04/11/2017 0710   Lipase     Component Value Date/Time   LIPASE 64 (H)  04/08/2017 4401       Studies/Results: No results found.  Anti-infectives: Anti-infectives    Start     Dose/Rate Route Frequency Ordered Stop   04/08/17 1309  ciprofloxacin (CIPRO) 400 MG/200ML IVPB  Status:  Discontinued    Comments:  Schonewitz, Leigh   : cabinet override      04/08/17 1309 04/08/17 1316   04/08/17 1000  ciprofloxacin (CIPRO) IVPB 400 mg     400 mg 200 mL/hr over 60 Minutes Intravenous On call to O.R. 04/07/17 0812 04/08/17 1405   04/05/17 2000  piperacillin-tazobactam (ZOSYN) IVPB 3.375 g  Status:  Discontinued     3.375 g 12.5 mL/hr over 240 Minutes Intravenous Every 8 hours 04/05/17 1220 04/06/17 1418   04/05/17 1215  piperacillin-tazobactam (ZOSYN) IVPB 3.375 g     3.375 g 100 mL/hr over 30 Minutes Intravenous  Once 04/05/17 1216 04/05/17 1447   04/05/17 1145  piperacillin-tazobactam (ZOSYN) IVPB 4.5 g  Status:  Discontinued     4.5 g 200 mL/hr over 30 Minutes Intravenous  Once 04/05/17 1137 04/05/17 1216     Assessment/Plan Biliary pancreatitis POD#3 S/p laparoscopic cholecystectomy 10/26 Dr. Georganna Skeans - afebrile, VSS, WBC WNL, LFTs trending down - tolerating full liquids but having poor oral intake, on ensure TID -  pain control and OOB/mobilize  - OT recommending SNF   FEN: full liquids, advance to low fat diet as tolerated. ID: perioperative cipro 10/26 VTE: SCD's, stable for chemical VTE prophylaxis/resumption of anticoagulatuion from surgical perspective.   Plan: advance diet to low fat as tolerated. Ok to resume coumadin.  Stable for discharge to recommended facility from surgical perspective.  Follow up provided    LOS: 6 days    Jill Alexanders , [facility] 04/11/2017, 9:35 AM Pager: [phone] Consults: [phone] Mon-Fri 7:00 am-4:30 pm Sat-Sun 7:00 am-11:30 am

## 2017-04-11 NOTE — Discharge Instructions (Signed)
Please arrive at least 30 min before your appointment to complete your check in paperwork.  If you are unable to arrive 30 min prior to your appointment time we may have to cancel or reschedule you. ° °LAPAROSCOPIC SURGERY: POST OP INSTRUCTIONS  °1. DIET: Follow a light bland diet the first 24 hours after arrival home, such as soup, liquids, crackers, etc. Be sure to include lots of fluids daily. Avoid fast food or heavy meals as your are more likely to get nauseated. Eat a low fat the next few days after surgery.  °2. Take your usually prescribed home medications unless otherwise directed. °3. PAIN CONTROL:  °1. Pain is best controlled by a usual combination of three different methods TOGETHER:  °1. Ice/Heat °2. Over the counter pain medication °3. Prescription pain medication °2. Most patients will experience some swelling and bruising around the incisions. Ice packs or heating pads (30-60 minutes up to 6 times a day) will help. Use ice for the first few days to help decrease swelling and bruising, then switch to heat to help relax tight/sore spots and speed recovery. Some people prefer to use ice alone, heat alone, alternating between ice & heat. Experiment to what works for you. Swelling and bruising can take several weeks to resolve.  °3. It is helpful to take an over-the-counter pain medication regularly for the first few weeks. Choose one of the following that works best for you:  °1. Naproxen (Aleve, etc) Two 220mg tabs twice a day °2. Ibuprofen (Advil, etc) Three 200mg tabs four times a day (every meal & bedtime) °3. Acetaminophen (Tylenol, etc) 500-650mg four times a day (every meal & bedtime) °4. A prescription for pain medication (such as oxycodone, hydrocodone, etc) should be given to you upon discharge. Take your pain medication as prescribed.  °1. If you are having problems/concerns with the prescription medicine (does not control pain, nausea, vomiting, rash, itching, etc), please call us [phone]  [phone] to see if we need to switch you to a different pain medicine that will work better for you and/or control your side effect better. °2. If you need a refill on your pain medication, please contact your pharmacy. They will contact our office to request authorization. Prescriptions will not be filled after 5 pm or on week-ends. °4. Avoid getting constipated. Between the surgery and the pain medications, it is common to experience some constipation. Increasing fluid intake and taking a fiber supplement (such as Metamucil, Citrucel, FiberCon, MiraLax, etc) 1-2 times a day regularly will usually help prevent this problem from occurring. A mild laxative (prune juice, Milk of Magnesia, MiraLax, etc) should be taken according to package directions if there are no bowel movements after 48 hours.  °5. Watch out for diarrhea. If you have many loose bowel movements, simplify your diet to bland foods & liquids for a few days. Stop any stool softeners and decrease your fiber supplement. Switching to mild anti-diarrheal medications (Kayopectate, Pepto Bismol) can help. If this worsens or does not improve, please call us. °6. Wash / shower every day. You may shower over the dressings as they are waterproof. Continue to shower over incision(s) after the dressing is off. °7. Remove your waterproof bandages 5 days after surgery. You may leave the incision open to air. You may replace a dressing/Band-Aid to cover the incision for comfort if you wish.  °8. ACTIVITIES as tolerated:  °1. You may resume regular (light) daily activities beginning the next day--such as daily self-care, walking, climbing stairs--gradually   increasing activities as tolerated. If you can walk 30 minutes without difficulty, it is safe to try more intense activity such as jogging, treadmill, bicycling, low-impact aerobics, swimming, etc. 2. Save the most intensive and strenuous activity for last such as sit-ups, heavy lifting, contact sports, etc Refrain  from any heavy lifting or straining until you are off narcotics for pain control.  3. DO NOT PUSH THROUGH PAIN. Let pain be your guide: If it hurts to do something, don't do it. Pain is your body warning you to avoid that activity for another week until the pain goes down. 4. You may drive when you are no longer taking prescription pain medication, you can comfortably wear a seatbelt, and you can safely maneuver your car and apply brakes. 5. You may have sexual intercourse when it is comfortable.  9. FOLLOW UP in our office  1. Please call [facility] at [phone] to set up an appointment to see your surgeon in the office for a follow-up appointment approximately 2-3 weeks after your surgery. 2. Make sure that you call for this appointment the day you arrive home to insure a convenient appointment time.      10. IF YOU HAVE DISABILITY OR FAMILY LEAVE FORMS, BRING THEM TO THE               OFFICE FOR PROCESSING.   WHEN TO CALL us [phone]:  1. Poor pain control 2. Reactions / problems with new medications (rash/itching, nausea, etc)  3. Fever over 101.5 F (38.5 C) 4. Inability to urinate 5. Nausea and/or vomiting 6. Worsening swelling or bruising 7. Continued bleeding from incision. 8. Increased pain, redness, or drainage from the incision  The clinic staff is available to answer your questions during regular business hours (8:30am-5pm). Please dont hesitate to call and ask to speak to one of our nurses for clinical concerns.  If you have a medical emergency, go to the nearest emergency room or call 911.  A surgeon from [facility] is always on call at the [facility], [place], [place], [place], [place] [address] ?  MAIN: [phone] ? TOLL FREE: [phone] ?  FAX [phone]  [email or web address]    Laparoscopic Cholecystectomy, Care After This sheet gives you information about how to care for yourself  after your procedure. Your health care provider may also give you more specific instructions. If you have problems or questions, contact your health care provider. What can I expect after the procedure? After the procedure, it is common to have:  Pain at your incision sites. You will be given medicines to control this pain.  Mild nausea or vomiting.  Bloating and possible shoulder pain from the air-like gas that was used during the procedure.  Follow these instructions at home: Incision care   Follow instructions from your health care provider about how to take care of your incisions. Make sure you: ? Wash your hands with soap and water before you change your bandage (dressing). If soap and water are not available, use hand sanitizer. ? Change your dressing as told by your health care provider. ? Leave stitches (sutures), skin glue, or adhesive strips in place. These skin closures may need to be in place for 2 weeks or longer. If adhesive strip edges start to loosen and curl up, you may trim the loose edges. Do not remove adhesive strips completely unless your health care provider tells you to do that.  Do not take baths, swim, or use a hot tub until your health care provider approves. Ask your health care provider if you can take showers. You may only be allowed to take sponge baths for bathing.  Check your incision area every day for signs of infection. Check for: ? More redness, swelling, or pain. ? More fluid or blood. ? Warmth. ? Pus or a bad smell. Activity  Do not drive or use heavy machinery while taking prescription pain medicine.  Do not lift anything that is heavier than 10 lb (4.5 kg) until your health care provider approves.  Do not play contact sports until your health care provider approves.  Do not drive for 24 hours if you were given a medicine to help you relax (sedative).  Rest as needed. Do not return to work or school until your health care provider  approves. General instructions  Take over-the-counter and prescription medicines only as told by your health care provider.  To prevent or treat constipation while you are taking prescription pain medicine, your health care provider may recommend that you: ? Drink enough fluid to keep your urine clear or pale yellow. ? Take over-the-counter or prescription medicines. ? Eat foods that are high in fiber, such as fresh fruits and vegetables, whole grains, and beans. ? Limit foods that are high in fat and processed sugars, such as fried and sweet foods. Contact a health care provider if:  You develop a rash.  You have more redness, swelling, or pain around your incisions.  You have more fluid or blood coming from your incisions.  Your incisions feel warm to the touch.  You have pus or a bad smell coming from your incisions.  You have a fever.  One or more of your incisions breaks open. Get help right away if:  You have trouble breathing.  You have chest pain.  You have increasing pain in your shoulders.  You faint or feel dizzy when you stand.  You have severe pain in your abdomen.  You have nausea or vomiting that lasts for more than one day.  You have leg pain. This information is not intended to replace advice given to you by your health care provider. Make sure you discuss any questions you have with your health care provider. Document Released: 05/31/2005 Document Revised: 12/20/2015 Document Reviewed: 11/17/2015 Elsevier Interactive Patient Education  2017 [employer].

## 2017-04-11 NOTE — Care Management Note (Signed)
Case Management Note  Patient Details  Name: TEIARA BARIA MRN: [id number] Date of Birth: 1932/09/19  Subjective/Objective:      Admitted with acute gallstone pancreatitis, hx of afib, htn, GERD. From home alone.       Olanda Boughner ([address]) Quinnlyn Hearns (Daughter)    [phone]          PCP: Encarnacion Chu  Action/Plan: Per PT's evaluation/recommendation: SNF;Supervision/Assistance - 24 hour. CSW referral placed.     Expected Discharge Date:                  Expected Discharge Plan:     In-House Referral:     Discharge planning Services  CM Consult  Post Acute Care Choice:    Choice offered to:     DME Arranged:    DME Agency:     HH Arranged:    HH Agency:     Status of Service:  In process, will continue to follow  If discussed at Long Length of Stay Meetings, dates discussed:    Additional Comments:  Sharin Mons, RN 04/11/2017, 9:27 PM

## 2017-04-11 NOTE — Progress Notes (Addendum)
PROGRESS NOTE    Charlotte Castro  VQM:[id number] DOB: [birth year] DOA: 04/05/2017 PCP: Leeroy Cha, MD     Brief Narrative:  Charlotte Castro is a 83 y.o. female with medical history significant of A Fib, HTN, GERD who presents with nausea, vomiting, epigastric abdominal pain. Work up as revealed acute gallstone pancreatitis with lipase > 3000. She was admitted for further evaluation and treatment. GI and general surgery were consulted. Patient ultimately underwent laparoscopic cholecystectomy on 10/26. Postop course complicated by confusion.   Assessment & Plan:   Principal Problem:   Acute gallstone pancreatitis Active Problems:   Permanent atrial fibrillation (HCC)   Essential hypertension, benign   Chronic diastolic CHF (congestive heart failure) (HCC)   Abdominal pain, acute, epigastric   Choledocholithiasis with obstruction   Persistent atrial fibrillation ([place])   Acute gallstone pancreatitis with choledocholithiasis  -GI and general surgery consulted -S/p lap chole 10/26  -LFT now normalized   Acute encephalopathy -She has been confused post-operatively, agitated.  -Slightly improved today, no longer agitated, but remains confused   Permanent A Fib -Lopressor -Coumadin/lovenox bridge per pharmacy  Hypothyroidism -Synthroid  Essential HTN -Lopressor, hydralazine prn   Chronic diastolic HF  -Euvolemic on exam    DVT prophylaxis: Lovenox/coumadin  Code Status: Full Family Communication: No family at bedside, updated son 10/29  Disposition Plan: Pending improvement, plan for SNF 10/30 if tolerates soft diet, will need coumadin dosing for outpatient    Consultants:   GI  General surgery  Procedures:   None   Antimicrobials:  Anti-infectives    Start     Dose/Rate Route Frequency Ordered Stop   04/08/17 1309  ciprofloxacin (CIPRO) 400 MG/200ML IVPB  Status:  Discontinued    Comments:  Schonewitz, Leigh   : cabinet override      04/08/17 1309  04/08/17 1316   04/08/17 1000  ciprofloxacin (CIPRO) IVPB 400 mg     400 mg 200 mL/hr over 60 Minutes Intravenous On call to O.R. 04/07/17 9326 04/08/17 1405   04/05/17 2000  piperacillin-tazobactam (ZOSYN) IVPB 3.375 g  Status:  Discontinued     3.375 g 12.5 mL/hr over 240 Minutes Intravenous Every 8 hours 04/05/17 1220 04/06/17 1418   04/05/17 1215  piperacillin-tazobactam (ZOSYN) IVPB 3.375 g     3.375 g 100 mL/hr over 30 Minutes Intravenous  Once 04/05/17 1216 04/05/17 1447   04/05/17 1145  piperacillin-tazobactam (ZOSYN) IVPB 4.5 g  Status:  Discontinued     4.5 g 200 mL/hr over 30 Minutes Intravenous  Once 04/05/17 1137 04/05/17 1216       Subjective: Patient remains confused this morning. Had BM charted yesterday 1 time. Has worked with OT. States she remains nauseous but improved, abdominal soreness improved as well.   Objective: Vitals:   04/10/17 2352 04/11/17 0500 04/11/17 0531 04/11/17 1021  BP: (!) 160/89  (!) 168/96 126/76  Pulse: (!) 103  95 95  Resp:   18   Temp:   97.8 F (36.6 C)   TempSrc:   Oral   SpO2:   95%   Weight:  100.7 kg (222 lb)    Height:        Intake/Output Summary (Last 24 hours) at 04/11/17 1147 Last data filed at 04/11/17 0905  Gross per 24 hour  Intake              596 ml  Output             1000 ml  Net             -  404 ml   Filed Weights   04/09/17 0443 04/10/17 0600 04/11/17 0500  Weight: 99.8 kg (220 lb) 101.2 kg (223 lb) 100.7 kg (222 lb)    Examination:  General exam: Appears confused  Respiratory system: Clear to auscultation. Respiratory effort normal. Cardiovascular system: S1 & S2 heard, irregular rhythm. No JVD, murmurs, rubs, gallops or clicks. No pedal edema. Gastrointestinal system: Abdomen is nondistended, soft and not TTP. No organomegaly or masses felt. Normal bowel sounds heard. Incisions clean and dry  Central nervous system: Alert and oriented, remains confused overall. Nonfocal exam, able to move all  extremities appropriately, CN 2-12 grossly normal. Speech fluent.  Extremities: Symmetric  Skin: No rashes, lesions or ulcers Psychiatry: confused this morning   Data Reviewed: I have personally reviewed following labs and imaging studies  CBC:  Recent Labs Lab 04/07/17 0448 04/08/17 0637 04/09/17 0731 04/10/17 0507 04/11/17 0710  WBC 9.3 8.3 11.3* 9.5 6.7  NEUTROABS 7.1 6.0 9.2* 7.4 4.1  HGB 13.7 13.4 14.0 13.5 12.8  HCT 39.6 37.7 39.4 37.6 37.5  MCV 93.2 93.1 91.8 91.5 93.3  PLT 127* 137* 156 156 174   Basic Metabolic Panel:  Recent Labs Lab 04/07/17 0448 04/08/17 0637 04/09/17 0731 04/10/17 0507 04/10/17 0905 04/11/17 0710  NA 141 141 141 139  --  140  K 3.8 3.6 3.7 3.2*  --  4.0  CL 108 110 112* 106  --  109  CO2 25 24 19* 23  --  25  GLUCOSE 95 90 110* 101*  --  95  BUN 8 6 8 11   --  12  CREATININE 0.80 0.72 0.75 0.74  --  0.74  CALCIUM 8.7* 8.6* 8.6* 8.7*  --  8.8*  MG  --   --   --   --  1.7  --    GFR: Estimated Creatinine Clearance: 67.3 mL/min (by C-G formula based on SCr of 0.74 mg/dL). Liver Function Tests:  Recent Labs Lab 04/07/17 0448 04/08/17 0814 04/09/17 0731 04/10/17 0507 04/11/17 0733  AST 72* 37 49* 33 21  ALT 93* 61* 59* 47 33  ALKPHOS 78 73 82 81 72  BILITOT 1.4* 1.6* 1.5* 1.7* 1.1  PROT 6.1* 6.0* 6.4* 6.3* 6.0*  ALBUMIN 3.2* 3.0* 3.0* 2.9* 2.7*    Recent Labs Lab 04/05/17 1010 04/06/17 0521 04/07/17 0448 04/08/17 0637  LIPASE 3,514* 364* 76* 64*   No results for input(s): AMMONIA in the last 168 hours. Coagulation Profile:  Recent Labs Lab 04/05/17 1743 04/07/17 0448 04/08/17 0637 04/08/17 1135  INR 2.07 1.98 1.90 1.62   Cardiac Enzymes: No results for input(s): CKTOTAL, CKMB, CKMBINDEX, TROPONINI in the last 168 hours. BNP (last 3 results) No results for input(s): PROBNP in the last 8760 hours. HbA1C: No results for input(s): HGBA1C in the last 72 hours. CBG: No results for input(s): GLUCAP in the last  168 hours. Lipid Profile: No results for input(s): CHOL, HDL, LDLCALC, TRIG, CHOLHDL, LDLDIRECT in the last 72 hours. Thyroid Function Tests: No results for input(s): TSH, T4TOTAL, FREET4, T3FREE, THYROIDAB in the last 72 hours. Anemia Panel: No results for input(s): VITAMINB12, FOLATE, FERRITIN, TIBC, IRON, RETICCTPCT in the last 72 hours. Sepsis Labs:  Recent Labs Lab 04/05/17 1019 04/05/17 1233 04/05/17 1743 04/05/17 2025  LATICACIDVEN 2.51* 3.18* 1.3 0.9    Recent Results (from the past 240 hour(s))  Blood Culture (routine x 2)     Status: None   Collection Time: 04/05/17 11:34 AM  Result Value  Ref Range Status   Specimen Description BLOOD RIGHT ANTECUBITAL  Final   Special Requests   Final    BOTTLES DRAWN AEROBIC AND ANAEROBIC Blood Culture results may not be optimal due to an excessive volume of blood received in culture bottles   Culture NO GROWTH 5 DAYS  Final   Report Status 04/10/2017 FINAL  Final  Blood Culture (routine x 2)     Status: None   Collection Time: 04/05/17 11:51 AM  Result Value Ref Range Status   Specimen Description BLOOD LEFT FOREARM  Final   Special Requests   Final    BOTTLES DRAWN AEROBIC AND ANAEROBIC Blood Culture adequate volume   Culture NO GROWTH 5 DAYS  Final   Report Status 04/10/2017 FINAL  Final  Surgical PCR screen     Status: None   Collection Time: 04/07/17 10:31 PM  Result Value Ref Range Status   MRSA, PCR NEGATIVE NEGATIVE Final   Staphylococcus aureus NEGATIVE NEGATIVE Final    Comment: (NOTE) The Xpert SA Assay (FDA approved for NASAL specimens in patients 74 years of age and older), is one component of a comprehensive surveillance program. It is not intended to diagnose infection nor to guide or monitor treatment.        Radiology Studies: No results found.    Scheduled Meds: . acetaminophen  650 mg Oral Q6H  . clonazePAM  0.25-0.5 mg Oral BID  . enoxaparin (LOVENOX) injection  40 mg Subcutaneous Q24H  .  feeding supplement  1 Container Oral TID BM  . levothyroxine  88 mcg Oral QAC breakfast  . metoprolol tartrate  50 mg Oral BID  . multivitamin with minerals  1 tablet Oral Daily  . pantoprazole  20 mg Oral Daily   Continuous Infusions:    LOS: 6 days    Time spent: 30 minutes   Dessa Phi, DO [facility] [email or web address] Password [email or web address] 04/11/2017, 11:47 AM

## 2017-04-12 LAB — COMPREHENSIVE METABOLIC PANEL
ALBUMIN: 2.8 g/dL — AB (ref 3.5–5.0)
ALT: 27 U/L (ref 14–54)
AST: 17 U/L (ref 15–41)
Alkaline Phosphatase: 71 U/L (ref 38–126)
Anion gap: 9 (ref 5–15)
BUN: 13 mg/dL (ref 6–20)
CHLORIDE: 109 mmol/L (ref 101–111)
CO2: 23 mmol/L (ref 22–32)
CREATININE: 0.79 mg/dL (ref 0.44–1.00)
Calcium: 9 mg/dL (ref 8.9–10.3)
GFR calc Af Amer: >60 mL/min (ref >60)
GFR calc non Af Amer: >60 mL/min (ref >60)
GLUCOSE: 101 mg/dL — AB (ref 65–99)
Potassium: 3.7 mmol/L (ref 3.5–5.1)
SODIUM: 141 mmol/L (ref 135–145)
TOTAL PROTEIN: 6 g/dL — AB (ref 6.5–8.1)
Total Bilirubin: 1 mg/dL (ref 0.3–1.2)

## 2017-04-12 LAB — CBC
HCT: 39.1 % (ref 36.0–46.0)
Hemoglobin: 13.8 g/dL (ref 12.0–15.0)
MCH: 32.5 pg (ref 26.0–34.0)
MCHC: 35.3 g/dL (ref 30.0–36.0)
MCV: 92 fL (ref 78.0–100.0)
Platelets: 179 10*3/uL (ref 150–400)
RBC: 4.25 MIL/uL (ref 3.87–5.11)
RDW: 13.2 % (ref 11.5–15.5)
WBC: 7.2 10*3/uL (ref 4.0–10.5)

## 2017-04-12 LAB — PROTIME-INR
INR: 1.17
PROTHROMBIN TIME: 14.8 s (ref 11.4–15.2)

## 2017-04-12 MED ORDER — CLONAZEPAM 0.5 MG PO TABS: 0.2500 mg | ORAL_TABLET | Freq: Two times a day (BID) | ORAL | 0 refills | Status: DC

## 2017-04-12 MED ORDER — BOOST / RESOURCE BREEZE PO LIQD: 1.0000 | Freq: Three times a day (TID) | ORAL | 0 refills | Status: AC

## 2017-04-12 MED ORDER — PANTOPRAZOLE SODIUM 20 MG PO TBEC: 20.0000 mg | DELAYED_RELEASE_TABLET | Freq: Every day | ORAL | 0 refills | Status: DC

## 2017-04-12 MED ORDER — ENOXAPARIN SODIUM 100 MG/ML ~~LOC~~ SOLN: 100.0000 mg | Freq: Two times a day (BID) | SUBCUTANEOUS | 0 refills | Status: DC

## 2017-04-12 NOTE — Progress Notes (Signed)
Patient will DC to: [facility]  Anticipated DC date: 04/12/17 Family notified: Son Transport by: Corey Harold   Per MD patient ready for DC to [facility]. RN, patient, patient's family, and facility notified of DC. Discharge Summary sent to facility. RN given number for report [phone] Room 401a). DC packet on chart. Ambulance transport requested for patient.   CSW signing off.  Cedric Fishman, [place] Social Worker [phone]

## 2017-04-12 NOTE — Progress Notes (Signed)
[facility] Progress Note  4 Days Post-Op  Subjective: CC: Feels a little nauseated this AM but denies emesis. States she feels like dinner did not sit well on her stomach. Abdominal soreness and HA both improved. Having bowel function, BMs less watery than early yesterday.   Objective: Vital signs in last 24 hours: Temp:  [97.3 F (36.3 C)-98.5 F (36.9 C)] 98.5 F (36.9 C) (10/30 0652) Pulse Rate:  [84-102] 102 (10/30 0652) Resp:  [12-20] 20 (10/30 0652) BP: (126-181)/(76-90) 157/85 (10/30 0652) SpO2:  [95 %-100 %] 100 % (10/30 9735) Weight:  [102.5 kg (225 lb 15.5 oz)] 102.5 kg (225 lb 15.5 oz) (10/30 0500) Last BM Date: 04/11/17  Intake/Output from previous day: 10/29 0701 - 10/30 0700 In: 514 [P.O.:514] Out: 1290 [Urine:1290] Intake/Output this shift: No intake/output data recorded.  PE: Gen:  Alert, NAD, pleasant Pulm:  Normal effort Abd: Soft, appropriately tender, non-distended, bowel sounds present in all 4 quadrants, incisions C/D/I Skin: warm and dry, no rashes  Psych: A&Ox3   Lab Results:   Recent Labs  04/11/17 0710 04/12/17 0653  WBC 6.7 7.2  HGB 12.8 13.8  HCT 37.5 39.1  PLT 173 179   BMET  Recent Labs  04/11/17 0710 04/12/17 0653  NA 140 141  K 4.0 3.7  CL 109 109  CO2 25 23  GLUCOSE 95 101*  BUN 12 13  CREATININE 0.74 0.79  CALCIUM 8.8* 9.0   PT/INR  Recent Labs  04/12/17 0653  LABPROT 14.8  INR 1.17   CMP     Component Value Date/Time   NA 141 04/12/2017 0653   K 3.7 04/12/2017 0653   CL 109 04/12/2017 0653   CO2 23 04/12/2017 0653   GLUCOSE 101 (H) 04/12/2017 0653   BUN 13 04/12/2017 0653   CREATININE 0.79 04/12/2017 0653   CALCIUM 9.0 04/12/2017 0653   PROT 6.0 (L) 04/12/2017 0653   ALBUMIN 2.8 (L) 04/12/2017 0653   AST 17 04/12/2017 0653   ALT 27 04/12/2017 0653   ALKPHOS 71 04/12/2017 0653   BILITOT 1.0 04/12/2017 0653   GFRNONAA >60 04/12/2017 0653   GFRAA >60 04/12/2017 0653   Lipase      Component Value Date/Time   LIPASE 64 (H) 04/08/2017 3299       Studies/Results: No results found.  Anti-infectives: Anti-infectives    Start     Dose/Rate Route Frequency Ordered Stop   04/08/17 1309  ciprofloxacin (CIPRO) 400 MG/200ML IVPB  Status:  Discontinued    Comments:  Schonewitz, Leigh   : cabinet override      04/08/17 1309 04/08/17 1316   04/08/17 1000  ciprofloxacin (CIPRO) IVPB 400 mg     400 mg 200 mL/hr over 60 Minutes Intravenous On call to O.R. 04/07/17 0812 04/08/17 1405   04/05/17 2000  piperacillin-tazobactam (ZOSYN) IVPB 3.375 g  Status:  Discontinued     3.375 g 12.5 mL/hr over 240 Minutes Intravenous Every 8 hours 04/05/17 1220 04/06/17 1418   04/05/17 1215  piperacillin-tazobactam (ZOSYN) IVPB 3.375 g     3.375 g 100 mL/hr over 30 Minutes Intravenous  Once 04/05/17 1216 04/05/17 1447   04/05/17 1145  piperacillin-tazobactam (ZOSYN) IVPB 4.5 g  Status:  Discontinued     4.5 g 200 mL/hr over 30 Minutes Intravenous  Once 04/05/17 1137 04/05/17 1216       Assessment/Plan Biliary pancreatitis POD#4 S/p laparoscopic cholecystectomy 10/26 Dr. Georganna Skeans - afebrile, VSS, WBC WNL, LFTs trending down -  tolerating diet with some nausea, no emesis - pain control and OOB/mobilize  FEN: SOFT, advance to low fat as tolerated ID: perioperative cipro 10/26  VTE: SCD's, resuming coumadin per pharmacy (INR 1.17 today)  Plan: advance diet to low fat as tolerated.  Stable for discharge to recommended facility from surgical perspective.  Follow up provided. General surgery will sign off - call as needed with questions/concerns.    LOS: 7 days    [facility] 04/12/2017, 7:58 AM Pager: [phone] Consults: [phone] Mon-Fri 7:00 am-4:30 pm Sat-Sun 7:00 am-11:30 am

## 2017-04-12 NOTE — Progress Notes (Signed)
Pt D/C'd with PTAR. 

## 2017-04-12 NOTE — Progress Notes (Signed)
CSW received consult regarding PT recommendation of SNF at discharge.  Patient is refusing SNF and states she has not had good experiences there before and that she would rather be in her own house with her own exercise machines. She gave permission for CSW to confirm this with her son. CSW also checking into [facility] to see if patient could qualify.    Percell Locus Classie Weng LCSWA [phone]

## 2017-04-12 NOTE — Progress Notes (Signed)
[name] to be D/C'd Skilled nursing facility per MD order.  Discussed with the patient and all questions fully answered.  VSS, Skin clean, dry and intact without evidence of skin break down, no evidence of skin tears noted. Incision sits clean and dry. IV catheter discontinued intact. Site without signs and symptoms of complications. Dressing and pressure applied.  Report called to Elmyra Ricks, RN at [facility]. All questions answered.  Pt waiting on PTAR for D/C.  Christoper Fabian Saranne Crislip 04/12/2017 1:27 PM

## 2017-04-12 NOTE — Clinical Social Work Note (Signed)
Clinical Social Work Assessment  Patient Details  Name: Charlotte Castro MRN: [id number] Date of Birth: [birth year]  Date of referral:  04/12/17               Reason for consult:  Facility Placement                Permission sought to share information with:  Facility Sport and exercise psychologist, Family Supports Permission granted to share information::  Yes, Verbal Permission Granted  Name::     Clinical biochemist::  SNFs  Relationship::  Son  Contact Information:  [phone]  Housing/Transportation Living arrangements for the past 2 months:  [facility] of Information:  Patient, Adult Children Patient Interpreter Needed:  None Criminal Activity/Legal Involvement Pertinent to Current Situation/Hospitalization:  No - Comment as needed Significant Relationships:  Adult Children Lives with:  Self Do you feel safe going back to the place where you live?  No Need for family participation in patient care:  Yes (Comment)  Care giving concerns:  CSW received consult for possible SNF placement at time of discharge. CSW spoke with patient again today regarding PT recommendation of SNF placement at time of discharge. Patient reported that she is now agreeable to SNF placement at time of discharge. CSW confirmed this plan with her son. CSW to continue to follow and assist with discharge planning needs.   Social Worker assessment / plan:  CSW spoke with patient concerning possibility of rehab at [facility] before returning home.  Employment status:  Retired Forensic scientist:  Medicare PT Recommendations:  [facility] / Referral to community resources:  [facility]  Patient/Family's Response to care:  Patient recognizes need for rehab before returning home and is agreeable to a SNF in [place]. Patient reported preference for [facility].  Patient/Family's Understanding of and Emotional Response to Diagnosis, Current Treatment, and  Prognosis:  Patient/family is realistic regarding therapy needs and expressed being hopeful for SNF placement. Patient expressed understanding of CSW role and discharge process as well as medical condition. No questions/concerns about plan or treatment.    Emotional Assessment Appearance:  Appears stated age Attitude/Demeanor/Rapport:  Other (Appropriate) Affect (typically observed):  Accepting, Appropriate Orientation:  Oriented to Self, Oriented to Situation, Oriented to Place, Oriented to  Time Alcohol / Substance use:  Not Applicable Psych involvement (Current and /or in the community):  No (Comment)  Discharge Needs  Concerns to be addressed:  Care Coordination Readmission within the last 30 days:  No Current discharge risk:  None Barriers to Discharge:  No Barriers Identified   [name], [place] 04/12/2017, 12:32 PM

## 2017-04-12 NOTE — Discharge Summary (Addendum)
Physician Discharge Summary  Charlotte Castro UPJ:[id number] DOB: 1932/09/27 DOA: 04/05/2017  PCP: Leeroy Cha, MD  Admit date: 04/05/2017 Discharge date: 04/12/2017  Admitted From: Home Disposition:  SNF  Recommendations for Outpatient Follow-up:  1. Follow up with PCP in 1 week 2. Follow up with general surgery on 11/13  3. Follow up for INR check in 1-2 days. Continue lovenox/coumadin bridge. Small amount of hematuria prior to discharge, please monitor. Hgb remained stable.   Home Health: PT OT RN Aide  Equipment/Devices: None   Discharge Condition: Stable CODE STATUS: Full  Diet recommendation: Soft diet, advance as tolerated to heart healthy diet   Brief/Interim Summary: Charlotte Maris Wilsonis a 83 y.o.femalewith medical history significant of A Fib, HTN, GERD who presents with nausea, vomiting, epigastric abdominal pain. Work up as revealed acute gallstone pancreatitis with lipase > 3000. She was admitted for further evaluation and treatment. GI and general surgery were consulted. Patient ultimately underwent laparoscopic cholecystectomy on 10/26. Postop course complicated by confusion, which has slowly improved over the past few days.   Discharge Diagnoses:  Principal Problem:   Acute gallstone pancreatitis Active Problems:   Permanent atrial fibrillation (HCC)   Essential hypertension, benign   Chronic diastolic CHF (congestive heart failure) (HCC)   Abdominal pain, acute, epigastric   Choledocholithiasis with obstruction   Persistent atrial fibrillation ([place])   Acute gallstone pancreatitis with choledocholithiasis  -GI and general surgery consulted -S/p lap chole 10/26  -LFT now normalized. Tolerated soft diet.  -Follow up with general surgery as outpatient   Acute metabolic or toxic encephalopathy -She has been confused post-operatively, agitated. Could be secondary to anesthetic given during surgery, pain medication, or metabolic process  -Improved today,  no longer agitated, alert and oriented x 3   Permanent A Fib -Lopressor -Coumadin/lovenox bridge per pharmacy  Hypothyroidism -Synthroid  Essential HTN -Lopressor, hydralazine prn   Chronic diastolic HF  -Euvolemic on exam     Discharge Instructions  Discharge Instructions    Call MD for:  difficulty breathing, headache or visual disturbances    Complete by:  As directed    Call MD for:  extreme fatigue    Complete by:  As directed    Call MD for:  hives    Complete by:  As directed    Call MD for:  persistant dizziness or light-headedness    Complete by:  As directed    Call MD for:  persistant nausea and vomiting    Complete by:  As directed    Call MD for:  redness, tenderness, or signs of infection (pain, swelling, redness, odor or green/yellow discharge around incision site)    Complete by:  As directed    Call MD for:  severe uncontrolled pain    Complete by:  As directed    Call MD for:  temperature >100.4    Complete by:  As directed    Discharge instructions    Complete by:  As directed    You were cared for by a hospitalist during your hospital stay. If you have any questions about your discharge medications or the care you received while you were in the hospital after you are discharged, you can call the unit and asked to speak with the hospitalist on call if the hospitalist that took care of you is not available. Once you are discharged, your primary care physician will handle any further medical issues. Please note that NO REFILLS for any discharge medications will be authorized once  you are discharged, as it is imperative that you return to your primary care physician (or establish a relationship with a primary care physician if you do not have one) for your aftercare needs so that they can reassess your need for medications and monitor your lab values.   Increase activity slowly    Complete by:  As directed    No wound care    Complete by:  As directed       Allergies as of 04/12/2017      Reactions   Iodinated Diagnostic Agents Shortness Of Breath   headache   Penicillins Other (See Comments)   Tolerated Zosyn Oct 2018      Medication List    TAKE these medications   acetaminophen 325 MG tablet Commonly known as:  TYLENOL Take 325 mg by mouth every 4 (four) hours as needed for headache (pain). Reported on 10/04/2015   Calcium-Vitamin D 600-200 MG-UNIT Caps Take 1 capsule by mouth daily. Reported on 10/04/2015   clonazePAM 0.5 MG tablet Commonly known as:  KLONOPIN Take 0.5-1 tablets (0.25-0.5 mg total) by mouth 2 (two) times daily.   Cyanocobalamin 1000 MCG/ML Kit Inject 1,000 mcg as directed every 30 (thirty) days.   enoxaparin 100 MG/ML injection Commonly known as:  LOVENOX Inject 1 mL (100 mg total) into the skin 2 (two) times daily.   feeding supplement Liqd Take 1 Container by mouth 3 (three) times daily between meals.   levothyroxine 88 MCG tablet Commonly known as:  SYNTHROID, LEVOTHROID Take 1 tablet (88 mcg total) by mouth daily before breakfast.   metoprolol tartrate 50 MG tablet Commonly known as:  LOPRESSOR Take 1 tablet (50 mg total) by mouth 2 (two) times daily.   NON FORMULARY [facility]  Peripheral Neuropathy Cream- Bupivacaine 1%, Doxepin 3%, Gabapentin 6%, Pentoxifylline 3%, Topiramate 1% Apply 1-2 grams to affected area 3-4 times daily Qty. 120 gm 3 refills   ondansetron 4 MG tablet Commonly known as:  ZOFRAN Take 4 mg by mouth every 8 (eight) hours as needed for nausea or vomiting.   OVER THE COUNTER MEDICATION Place 1 drop into both eyes daily as needed (dry eyes). Over the counter lubricating eye drop   pantoprazole 20 MG tablet Commonly known as:  PROTONIX Take 1 tablet (20 mg total) by mouth daily.   Vitamin D3 1000 units Caps Take 1,000 Units by mouth daily. Reported on 10/04/2015   warfarin 5 MG tablet Commonly known as:  COUMADIN Take 1/2 to 1 tablet daily as directed by  coumadin clinic What changed:  how much to take  how to take this  when to take this  additional instructions      Follow-up [facility] Follow up on 04/26/2017.   Specialty:  General Surgery Why:  Your appointment is at 9:45 AM.  Please arrive 30 minutes prior to scheduled appointment for check in.  Bring photo ID and insurance information. Contact information: [address] [place] [phone]       Leeroy Cha, MD. Schedule an appointment as soon as possible for a visit in 1 week(s).   Specialty:  Internal Medicine Why:  Need INR check in 1-2 days  Contact information: [address] [place] [address] [phone]          Allergies  Allergen Reactions  . Iodinated Diagnostic Agents Shortness Of Breath    headache  . Penicillins Other (  See Comments)    Tolerated Zosyn Oct 2018    Consultations:  GI  General Surgery  Cardiology    Procedures/Studies: Ct Abdomen Pelvis Wo Contrast  Result Date: 04/05/2017 CLINICAL DATA:  Unspecified abdominal pain EXAM: CT ABDOMEN AND PELVIS WITHOUT CONTRAST TECHNIQUE: Multidetector CT imaging of the abdomen and pelvis was performed following the standard protocol without IV contrast. COMPARISON:  10/10/2015 FINDINGS: Lower chest: Large hiatal hernia without superimposed inflammation or obstruction. Mild scar-like or atelectatic opacities in the lower lungs. There are few subcentimeter pulmonary nodules in the lower lungs that were also seen 10/10/2015, consistent with benign process. Hepatobiliary: No focal liver abnormality.At least 1 gallstone is visualized. Lost low-density appearance of the distal common bile duct. No duct dilatation where discretely visible. Pancreas: Mild stranding around the pancreas. No evidence of fluid collection. Spleen: Unremarkable. Adrenals/Urinary Tract: Negative adrenals. No  hydronephrosis or stone. Unremarkable bladder. Stomach/Bowel:  No obstruction. No appendicitis. Vascular/Lymphatic: No acute vascular abnormality. No mass or adenopathy. Reproductive:No pathologic findings. Other: No ascites or pneumoperitoneum. Musculoskeletal: No acute finding. Asymmetric advanced right hip osteoarthritis. L4-5 and L5-S1 discectomy with no evident bony fusion. Biforaminal impingement at L4-5 and L5-S1. IMPRESSION: 1. Mild stranding around the pancreas consistent with pancreatitis. 2. Distal common bile duct is poorly visualized and there could be filling defect such is choledocholithiasis. 3. Cholelithiasis without cholecystitis. 4. Large hiatal hernia. Electronically Signed   By: Monte Fantasia M.D.   On: 04/05/2017 14:09       Discharge Exam: Vitals:   04/12/17 0652 04/12/17 0816  BP: (!) 157/85 (!) 156/85  Pulse: (!) 102 78  Resp: 20   Temp: 98.5 F (36.9 C)   SpO2: 100%    Vitals:   04/11/17 2210 04/12/17 0500 04/12/17 0652 04/12/17 0816  BP: (!) 181/86  (!) 157/85 (!) 156/85  Pulse: 95  (!) 102 78  Resp: 18  20   Temp: 97.8 F (36.6 C)  98.5 F (36.9 C)   TempSrc: Oral  Oral   SpO2: 95%  100%   Weight:  102.5 kg (225 lb 15.5 oz)    Height:        General: Pt is alert, awake, not in acute distress Cardiovascular: Irregular, S1/S2 +, no rubs, no gallops Respiratory: CTA bilaterally, no wheezing, no rhonchi Abdominal: Soft, NT, ND, bowel sounds + Extremities: no edema, no cyanosis Neuro: nonfocal, speech fluent, alert and oriented x 3, much less confused this morning, nearly baseline today     The results of significant diagnostics from this hospitalization (including imaging, microbiology, ancillary and laboratory) are listed below for reference.     Microbiology: Recent Results (from the past 240 hour(s))  Blood Culture (routine x 2)     Status: None   Collection Time: 04/05/17 11:34 AM  Result Value Ref Range Status   Specimen Description BLOOD  RIGHT ANTECUBITAL  Final   Special Requests   Final    BOTTLES DRAWN AEROBIC AND ANAEROBIC Blood Culture results may not be optimal due to an excessive volume of blood received in culture bottles   Culture NO GROWTH 5 DAYS  Final   Report Status 04/10/2017 FINAL  Final  Blood Culture (routine x 2)     Status: None   Collection Time: 04/05/17 11:51 AM  Result Value Ref Range Status   Specimen Description BLOOD LEFT FOREARM  Final   Special Requests   Final    BOTTLES DRAWN AEROBIC AND ANAEROBIC Blood Culture adequate volume   Culture  NO GROWTH 5 DAYS  Final   Report Status 04/10/2017 FINAL  Final  Surgical PCR screen     Status: None   Collection Time: 04/07/17 10:31 PM  Result Value Ref Range Status   MRSA, PCR NEGATIVE NEGATIVE Final   Staphylococcus aureus NEGATIVE NEGATIVE Final    Comment: (NOTE) The Xpert SA Assay (FDA approved for NASAL specimens in patients 79 years of age and older), is one component of a comprehensive surveillance program. It is not intended to diagnose infection nor to guide or monitor treatment.      Labs: BNP (last 3 results) No results for input(s): BNP in the last 8760 hours. Basic Metabolic Panel:  Recent Labs Lab 04/08/17 0637 04/09/17 0731 04/10/17 0507 04/10/17 0905 04/11/17 0710 04/12/17 0653  NA 141 141 139  --  140 141  K 3.6 3.7 3.2*  --  4.0 3.7  CL 110 112* 106  --  109 109  CO2 24 19* 23  --  25 23  GLUCOSE 90 110* 101*  --  95 101*  BUN 6 8 11   --  12 13  CREATININE 0.72 0.75 0.74  --  0.74 0.79  CALCIUM 8.6* 8.6* 8.7*  --  8.8* 9.0  MG  --   --   --  1.7  --   --    Liver Function Tests:  Recent Labs Lab 04/08/17 0637 04/09/17 0731 04/10/17 0507 04/11/17 0733 04/12/17 0653  AST 37 49* 33 21 17  ALT 61* 59* 47 33 27  ALKPHOS 73 82 81 72 71  BILITOT 1.6* 1.5* 1.7* 1.1 1.0  PROT 6.0* 6.4* 6.3* 6.0* 6.0*  ALBUMIN 3.0* 3.0* 2.9* 2.7* 2.8*    Recent Labs Lab 04/06/17 0521 04/07/17 0448 04/08/17 0637  LIPASE  364* 76* 64*   No results for input(s): AMMONIA in the last 168 hours. CBC:  Recent Labs Lab 04/07/17 0448 04/08/17 5956 04/09/17 0731 04/10/17 0507 04/11/17 0710 04/12/17 0653  WBC 9.3 8.3 11.3* 9.5 6.7 7.2  NEUTROABS 7.1 6.0 9.2* 7.4 4.1  --   HGB 13.7 13.4 14.0 13.5 12.8 13.8  HCT 39.6 37.7 39.4 37.6 37.5 39.1  MCV 93.2 93.1 91.8 91.5 93.3 92.0  PLT 127* 137* 156 156 173 179   Cardiac Enzymes: No results for input(s): CKTOTAL, CKMB, CKMBINDEX, TROPONINI in the last 168 hours. BNP: Invalid input(s): POCBNP CBG:  Recent Labs Lab 04/11/17 2012  GLUCAP 112*   D-Dimer No results for input(s): DDIMER in the last 72 hours. Hgb A1c No results for input(s): HGBA1C in the last 72 hours. Lipid Profile No results for input(s): CHOL, HDL, LDLCALC, TRIG, CHOLHDL, LDLDIRECT in the last 72 hours. Thyroid function studies No results for input(s): TSH, T4TOTAL, T3FREE, THYROIDAB in the last 72 hours.  Invalid input(s): FREET3 Anemia work up No results for input(s): VITAMINB12, FOLATE, FERRITIN, TIBC, IRON, RETICCTPCT in the last 72 hours. Urinalysis    Component Value Date/Time   COLORURINE YELLOW 04/05/2017 2041   APPEARANCEUR CLEAR 04/05/2017 2041   LABSPEC 1.015 04/05/2017 2041   PHURINE 7.0 04/05/2017 2041   GLUCOSEU NEGATIVE 04/05/2017 2041   HGBUR NEGATIVE 04/05/2017 2041   [place] NEGATIVE 04/05/2017 2041   KETONESUR NEGATIVE 04/05/2017 2041   PROTEINUR NEGATIVE 04/05/2017 2041   UROBILINOGEN 2.0 (H) 11/18/2010 0019   NITRITE POSITIVE (A) 04/05/2017 2041   LEUKOCYTESUR NEGATIVE 04/05/2017 2041   Sepsis Labs Invalid input(s): PROCALCITONIN,  WBC,  LACTICIDVEN Microbiology Recent Results (from the past 240 hour(s))  Blood Culture (routine x 2)     Status: None   Collection Time: 04/05/17 11:34 AM  Result Value Ref Range Status   Specimen Description BLOOD RIGHT ANTECUBITAL  Final   Special Requests   Final    BOTTLES DRAWN AEROBIC AND ANAEROBIC Blood  Culture results may not be optimal due to an excessive volume of blood received in culture bottles   Culture NO GROWTH 5 DAYS  Final   Report Status 04/10/2017 FINAL  Final  Blood Culture (routine x 2)     Status: None   Collection Time: 04/05/17 11:51 AM  Result Value Ref Range Status   Specimen Description BLOOD LEFT FOREARM  Final   Special Requests   Final    BOTTLES DRAWN AEROBIC AND ANAEROBIC Blood Culture adequate volume   Culture NO GROWTH 5 DAYS  Final   Report Status 04/10/2017 FINAL  Final  Surgical PCR screen     Status: None   Collection Time: 04/07/17 10:31 PM  Result Value Ref Range Status   MRSA, PCR NEGATIVE NEGATIVE Final   Staphylococcus aureus NEGATIVE NEGATIVE Final    Comment: (NOTE) The Xpert SA Assay (FDA approved for NASAL specimens in patients 36 years of age and older), is one component of a comprehensive surveillance program. It is not intended to diagnose infection nor to guide or monitor treatment.      Time coordinating discharge: 40 minutes  SIGNED:  Dessa Phi, DO [facility] Pager [phone]  If 7PM-7AM, please contact night-coverage [email or web address] Password [email or web address] 04/12/2017, 10:26 AM

## 2017-04-12 NOTE — Clinical Social Work Placement (Signed)
   CLINICAL SOCIAL WORK PLACEMENT  NOTE  Date:  04/12/2017  Patient Details  Name: Charlotte Castro MRN: [id number] Date of Birth: Jan 28, 1933  Clinical Social Work is seeking post-discharge placement for this patient at the [place] level of care (*CSW will initial, date and re-position this form in  chart as items are completed):  Yes   Patient/family provided with [place] Work Department's list of facilities offering this level of care within the geographic area requested by the patient (or if unable, by the patient's family).  Yes   Patient/family informed of their freedom to choose among providers that offer the needed level of care, that participate in Medicare, Medicaid or managed care program needed by the patient, have an available bed and are willing to accept the patient.  Yes   Patient/family informed of [place]'s ownership interest in [facility] and [facility], as well as of the fact that they are under no obligation to receive care at these facilities.  PASRR submitted to EDS on       PASRR number received on       Existing PASRR number confirmed on 04/12/17     FL2 transmitted to all facilities in geographic area requested by pt/family on 04/12/17     FL2 transmitted to all facilities within larger geographic area on       Patient informed that his/her managed care company has contracts with or will negotiate with certain facilities, including the following:        Yes   Patient/family informed of bed offers received.  Patient chooses bed at [facility] [place]     Physician recommends and patient chooses bed at      Patient to be transferred to [place] [place] on 04/12/17.  Patient to be transferred to facility by [facility]     Patient family notified on 04/12/17 of transfer.  Name of family member notified:  Son     PHYSICIAN       Additional Comment:     _______________________________________________ [name], [place] 04/12/2017, 12:35 PM

## 2017-04-12 NOTE — Consult Note (Signed)
            [facility] CM Primary Care Navigator  04/12/2017  Charlotte Castro [birth year] [id number]   Went to seepatientat the bedside to identify possible discharge needs.  Patient reports having "increased abdominal pain, nausea/ vomiting and headache" that had led to this admission.  Patient endorses Dr. Leeroy Cha with [facility] as herprimary care provider.  Patient [place] on [facility] to obtain medications without any problem. Patient verbalized managing her ownmedications at home straight out of the containers. Patient reports that she had been driving prior to admission but son Gershon Mussel) will be able to providetransportation to her doctors' appointments when discharged.  Patient mentioned that she lives at home alone but son will assist her with care needs if needed.  Anticipated discharge plan is skilled nursing facility ([facility]- [facility]) per therapy recommendation. Patient verbalized that this would be good for now since they are working on her house- "spraying the bugs and pests". Patient states "going somewhere to get more strengthened will be appropriate while they work on my house, to be ready when I come back."  Patient expressed understanding to callprimary care provider's officewhen shereturnsback home for a post discharge follow-up appointment within a week or sooner if needed. Patient letter (with PCP's contact number) was provided as a reminder.  Explained to patientabout [facility] CM services available for health management at home but she communicated no other needs or concerns at this time. Patient voiced understanding to seek referral from primary care provider to [facility] care management as deemed necessary for services in the future.  [facility] care management information provided for future needs that may arise.   For additional questions please contact:  Edwena Felty A. Sharlena Kristensen, BSN, RN-BC [facility]  PRIMARY CARE Navigator Cell: [phone]

## 2017-04-27 ENCOUNTER — Other Ambulatory Visit: Payer: Self-pay | Admitting: *Deleted

## 2017-04-27 NOTE — Patient Outreach (Signed)
[facility]) Care Management  04/27/2017  Charlotte Castro [birth year] [id number]   Met with patient at facility.  Patient reports she will be returning home alone upon discharge.  She does report she has a son, who lives in [place] and a daughter who lives in [place].  She states they both assist her some but states she manages most things herself.  She states she has all of the equipment she needs.  She states she does not want home care when she goes home as she has had them in the past and does not feel she needs them again.   RNCM reviewed [facility] and the differences between Care management and home care.  Patient declines but did accept RNCM contact and [facility] brochure.   Confirmed with Derenda Mis, SW at facility that patient is refusing home care services upon discharge.   Plan to sign off.  Royetta Crochet. Laymond Purser, RN, BSN, [place] [phone]) Business Cell  [phone]) Toll Free Office

## 2017-05-04 ENCOUNTER — Telehealth: Payer: Self-pay | Admitting: Interventional Cardiology

## 2017-05-04 NOTE — Telephone Encounter (Signed)
New Message  Jonelle Sports from [facility] called requesting to speak with RN to inform Dr. Sharyne Richters will visit the pts home on 11/27. Please call back if needed.

## 2017-05-10 ENCOUNTER — Telehealth: Payer: Self-pay | Admitting: Interventional Cardiology

## 2017-05-10 NOTE — Telephone Encounter (Signed)
New message   Home health nurse is calling to verbalize that the pt want her to start home health services on 05/12/2017 instead of 05/10/2017

## 2017-05-11 ENCOUNTER — Ambulatory Visit (INDEPENDENT_AMBULATORY_CARE_PROVIDER_SITE_OTHER): Payer: Medicare Other | Admitting: Podiatry

## 2017-05-11 DIAGNOSIS — L97522 Non-pressure chronic ulcer of other part of left foot with fat layer exposed: Secondary | ICD-10-CM

## 2017-05-11 DIAGNOSIS — L97529 Non-pressure chronic ulcer of other part of left foot with unspecified severity: Secondary | ICD-10-CM | POA: Diagnosis not present

## 2017-05-11 DIAGNOSIS — I83025 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: Secondary | ICD-10-CM | POA: Diagnosis not present

## 2017-05-11 MED ORDER — GENTAMICIN SULFATE 0.1 % EX CREA: 1.0000 "application " | TOPICAL_CREAM | Freq: Three times a day (TID) | CUTANEOUS | 1 refills | Status: DC

## 2017-05-11 NOTE — Telephone Encounter (Signed)
Returned call to [place] regarding the patient's [place] orders. Left message on her VM making her aware that [place] orders should be managed by the patient's PCP.

## 2017-05-15 NOTE — Progress Notes (Signed)
   Subjective:  Patient presents today for evaluation of a painful lesion of the left third toe that appeared about 4 months ago. She reports associated purulent drainage from the wound. Walking increases her pain. She denies alleviating factors. Patient has been caring for the wound at home. Patient presents today for further treatment and evaluation of the ulceration site.    Past Medical History:  Diagnosis Date  . Anxiety   . Arthritis    "left knee" (04/05/2017)  . Atrial fibrillation ([place])   . Atrial flutter ([place])    typical appearing  . Atrial tachycardia ([place])    ablated 11/17/10  by JA  from the [facility] of the aorta  . Chronic lower back pain   . Dizziness    chronic and of an unclear etiology  . Gastritis   . GERD (gastroesophageal reflux disease)   . History of blood transfusion ~ 1948  . HTN (hypertension)   . Hyperlipemia   . Hypothyroidism   . Obesity   . Osteoporosis   . Sinus headache   . Vitamin D deficiency      Objective/Physical Exam General: The patient is alert and oriented x3 in no acute distress.  Dermatology:  Wound #1 noted to the 3rd toe of the left foot measuring approximately 1.0 x 1.0 x 0.2 cm (LxWxD).   To the noted ulceration(s), there is no eschar. There is a moderate amount of slough, fibrin, and necrotic tissue noted. Granulation tissue and wound base is red. There is a minimal amount of serosanguineous drainage noted. There is no exposed bone muscle-tendon ligament or joint. There is no malodor. Periwound integrity is intact. Skin is warm, dry and supple bilateral lower extremities.  Vascular: Palpable pedal pulses bilaterally. Mild edema noted. Capillary refill within normal limits.    Neurological: Epicritic and protective threshold absent bilaterally.   Musculoskeletal Exam: Range of motion within normal limits to all pedal and ankle joints bilateral. Muscle strength 5/5 in all groups bilateral.   Assessment: #1 Ulceration of the left  third toe   Plan of Care:  #1 Patient was evaluated. #2 medically necessary excisional debridement including subcutaneous tissue was performed using a tissue nipper and a chisel blade. Excisional debridement of all the necrotic nonviable tissue down to healthy bleeding viable tissue was performed with post-debridement measurements same as pre-. #3 the wound was cleansed with normal saline. #4 Prescription for Gentamicin cream for daily use with a Band-Aid provided to patient.  #5 Recommended good shoe gear. #6 Return to clinic in 3 weeks.   Edrick Kins, DPM [facility]  Dr. Edrick Kins, [address]                                        [place], [place] [address]                Office [phone]  Fax [phone]

## 2017-05-24 ENCOUNTER — Ambulatory Visit: Payer: Medicare Other | Admitting: Interventional Cardiology

## 2017-06-08 ENCOUNTER — Ambulatory Visit (INDEPENDENT_AMBULATORY_CARE_PROVIDER_SITE_OTHER): Payer: Medicare Other | Admitting: Neurology

## 2017-06-08 ENCOUNTER — Encounter: Payer: Self-pay | Admitting: Neurology

## 2017-06-08 VITALS — BP 120/84 | HR 77 | Ht 68.0 in | Wt 205.5 lb

## 2017-06-08 DIAGNOSIS — G250 Essential tremor: Secondary | ICD-10-CM

## 2017-06-08 DIAGNOSIS — L97529 Non-pressure chronic ulcer of other part of left foot with unspecified severity: Secondary | ICD-10-CM

## 2017-06-08 NOTE — Progress Notes (Signed)
[facility] Clinic Note - Initial Visit   Date: 06/08/17  Charlotte Castro MRN: [id number] DOB: 1932/06/15   Dear Dr. Fara Olden:  Thank you for your kind referral of Charlotte Castro for consultation of foot pain. Although her history is well known to you, please allow [place] to reiterate it for the purpose of our medical record. The patient was accompanied to the clinic by self.    History of Present Illness: Charlotte Castro is a 83 y.o. right-handed Caucasian female with atrial fibrillation on coumadin, GERD, hypothyroidism, hypertension, and vitamin B12 deficiency presenting for evaluation of left toe pain.    Starting around the late summer, she began noticing achy pain over the pad of the left 3rd digit and attributed it to her shoe being too tight.  She initially saw podiatry who recommended seeing neurology due to her exquisite pain and concern for neuropathy. By November, she develops an ulceration and purulent discharge from pad of the 3rd toe and returned to see podiatry (Dr. Amalia Hailey).  He debrided the area and cleaned the wound. She was started on gentamicin ointment and she appreciates about 50% improvement in pain and discomfort. Her wound is healing well and she no longer has drainage.  She continues to have burning, achy and throbbing pain of the 3rd toe on the left.  She denies similar symptoms on the right foot or numbness.  Out-side paper records, electronic medical record, and images have been reviewed where available and summarized as:  Labs 12/30/2016:  Vitamin B12 128*  Lab Results  Component Value Date   TSH 1.455 04/10/2016     Past Medical History:  Diagnosis Date  . Anxiety   . Arthritis    "left knee" (04/05/2017)  . Atrial fibrillation ([place])   . Atrial flutter ([place])    typical appearing  . Atrial tachycardia ([place])    ablated 11/17/10  by JA  from the [facility] of the aorta  . Chronic lower back pain   . Dizziness    chronic and of an  unclear etiology  . Gastritis   . GERD (gastroesophageal reflux disease)   . History of blood transfusion ~ 1948  . HTN (hypertension)   . Hyperlipemia   . Hypothyroidism   . Obesity   . Osteoporosis   . Sinus headache   . Vitamin D deficiency     Past Surgical History:  Procedure Laterality Date  . ATRIAL ABLATION SURGERY  11/17/10   Atrial tachycardia arising from [facility] of the aorta ablated by JA  . BACK SURGERY    . CARDIAC CATHETERIZATION  01/11/2011   Archie Endo 01/12/2011  . CHOLECYSTECTOMY N/A 04/08/2017   Procedure: LAPAROSCOPIC CHOLECYSTECTOMY;  Surgeon: Georganna Skeans, MD;  Location: [place];  Service: General;  Laterality: N/A;  . FRACTURE SURGERY    . LUMBAR SPINE SURGERY  ~ 1998  . SHOULDER SURGERY Right 1984 X2   Rollene Rotunda 01/19/2011  . WRIST FRACTURE SURGERY Left 1983     Medications:  Outpatient Encounter Medications as of 06/08/2017  Medication Sig  . acetaminophen (TYLENOL) 325 MG tablet Take 325 mg by mouth every 4 (four) hours as needed for headache (pain). Reported on 10/04/2015  . Calcium Carbonate-Vitamin D (CALCIUM-VITAMIN D) 600-200 MG-UNIT CAPS Take 1 capsule by mouth daily. Reported on 10/04/2015  . Cholecalciferol (VITAMIN D3) 1000 UNITS CAPS Take 1,000 Units by mouth daily. Reported on 10/04/2015  . clonazePAM (KLONOPIN) 0.5 MG tablet Take 0.5-1 tablets (0.25-0.5 mg total) by  mouth 2 (two) times daily.  . Cyanocobalamin 1000 MCG/ML KIT Inject 1,000 mcg as directed every 30 (thirty) days.  Marland Kitchen gentamicin cream (GARAMYCIN) 0.1 % Apply 1 application topically 3 (three) times daily.  Marland Kitchen levothyroxine (SYNTHROID, LEVOTHROID) 88 MCG tablet Take 1 tablet (88 mcg total) by mouth daily before breakfast.  . metoprolol (LOPRESSOR) 50 MG tablet Take 1 tablet (50 mg total) by mouth 2 (two) times daily.  Salley Scarlet [facility]  Peripheral Neuropathy Cream- Bupivacaine 1%, Doxepin 3%, Gabapentin 6%, Pentoxifylline 3%, Topiramate 1% Apply 1-2 grams to affected area 3-4  times daily Qty. 120 gm 3 refills  . ondansetron (ZOFRAN) 4 MG tablet Take 4 mg by mouth every 8 (eight) hours as needed for nausea or vomiting.  Marland Kitchen OVER THE COUNTER MEDICATION Place 1 drop into both eyes daily as needed (dry eyes). Over the counter lubricating eye drop  . warfarin (COUMADIN) 5 MG tablet Take 1/2 to 1 tablet daily as directed by coumadin clinic (Patient taking differently: Take 2.5-5 mg by mouth See admin instructions. 2.5 mg on Sunday-all other days 5 mg)  . pantoprazole (PROTONIX) 20 MG tablet Take 1 tablet (20 mg total) by mouth daily.  . [DISCONTINUED] enoxaparin (LOVENOX) 100 MG/ML injection Inject 1 mL (100 mg total) into the skin 2 (two) times daily.   No facility-administered encounter medications on file as of 06/08/2017.      Allergies:  Allergies  Allergen Reactions  . Iodinated Diagnostic Agents Shortness Of Breath    headache  . Penicillins Other (See Comments)    Tolerated Zosyn Oct 2018    Family History: Family History  Problem Relation Age of Onset  . Heart attack Father   . Hypertension Father     Social History: Social History   Tobacco Use  . Smoking status: Never Smoker  . Smokeless tobacco: Never Used  Substance Use Topics  . Alcohol use: No  . Drug use: No   Social History   Social History Narrative   Lives alone in a one story home.  Has 2 children.  Retired.  Education: high school.      Review of Systems:  CONSTITUTIONAL: No fevers, chills, night sweats, or weight loss.   EYES: No visual changes or eye pain ENT: No hearing changes.  No history of nose bleeds.   RESPIRATORY: No cough, wheezing and shortness of breath.   CARDIOVASCULAR: Negative for chest pain, and palpitations.   GI: Negative for abdominal discomfort, blood in stools or black stools.  No recent change in bowel habits.   GU:  No history of incontinence.   MUSCLOSKELETAL: +history of joint pain or swelling.  No myalgias.   SKIN: +lesions, rash, and itching.    HEMATOLOGY/ONCOLOGY: Negative for prolonged bleeding, bruising easily, and swollen nodes.  No history of cancer.   ENDOCRINE: Negative for cold or heat intolerance, polydipsia or goiter.   PSYCH:  No depression or anxiety symptoms.   NEURO: As Above.   Vital Signs:  BP 120/84   Pulse 77   Ht 5' 8"  (1.727 m)   Wt 205 lb 8 oz (93.2 kg)   SpO2 98%   BMI 31.25 kg/m    General Medical Exam:   General:  Well appearing, comfortable, chin tremor is present and rest and with action.   Eyes/ENT: see cranial nerve examination.   Neck: No masses appreciated.  Full range of motion without tenderness.  No carotid bruits. Respiratory:  Clear to auscultation, good air entry  bilaterally.   Cardiac:  Regular rate and rhythm, no murmur.   Extremities:  No deformities, edema, or skin discoloration.  Skin:  Left 3rd toe with dry healing wound at the base of the toe.  Neurological Exam: MENTAL STATUS including orientation to time, place, person, recent and remote memory, attention span and concentration, language, and fund of knowledge is normal.  Speech is not dysarthric.  CRANIAL NERVES: II:  No visual field defects.   III-IV-VI: Pupils equal round and reactive to light.  Normal conjugate, extra-ocular eye movements in all directions of gaze.  No nystagmus.  No ptosis.   V:  Normal facial sensation.     VII:  Normal facial symmetry and movements.  VIII:  Normal hearing and vestibular function.   IX-X:  Normal palatal movement.   XI:  Normal shoulder shrug and head rotation.   XII:  Normal tongue strength and range of motion, no deviation or fasciculation.  MOTOR: She has mild pain limiting left dorsiflexion and toe extension, but otherwise strength is 5/5 throughout. There is mild right hand tremor and chin tremor at rest and worse with activity.   MSRs:  Right                                                                 Left brachioradialis 2+  brachioradialis 2+  biceps 2+  biceps 2+    triceps 2+  triceps 2+  patellar 2+  patellar 2+  ankle jerk 1+  ankle jerk 1+  Hoffman no  Hoffman no  plantar response down  plantar response down   SENSORY:  Normal and symmetric perception of light touch, pinprick, vibration, and proprioception.   COORDINATION/GAIT: Normal finger-to- nose-finger.  Intact rapid alternating movements bilaterally.  Able to rise from a chair without using arms. Gait is assisted with cane, stable.   IMPRESSION: 1.  Left toe pain at the base of the 3rd toe is due to underlying infection, and less likely manifestation of peripheral neuropathy.  She has about 50% relief with gentamicin cream.  Recommend follow-up with podiatry.  If she develops numbness, tingling, burning pain bilaterally after her ulceration has completely healed, I would be happy to reassess her symptoms.    2.  Benign essential tremor manifesting with right hand tremor and chin tremor.  She is not bothered by the tremor as it does not interfere with day-to-day activities.  She takes metoprolol 17m BID and clonazepam 0.546mBID and does not wish to make any dose changes.    3.  Vitamin B12 deficiency.  Continue monthly vitamin B12 injections  Return to clinic as needed   Thank you for allowing me to participate in patient's care.  If I can answer any additional questions, I would be pleased to do so.    Sincerely,    Aviyana Sonntag K. PaPosey ProntoDO

## 2017-06-08 NOTE — Patient Instructions (Signed)
Recommend follow-up with Dr. Amalia Hailey with podiatry.

## 2017-06-10 ENCOUNTER — Observation Stay (HOSPITAL_COMMUNITY)
Admission: EM | Admit: 2017-06-10 | Discharge: 2017-06-10 | Disposition: A | Discharge status: Home / Self Care | Payer: Medicare Other | Attending: Family Medicine | Admitting: Family Medicine

## 2017-06-10 ENCOUNTER — Other Ambulatory Visit: Payer: Self-pay

## 2017-06-10 ENCOUNTER — Observation Stay (HOSPITAL_COMMUNITY): Payer: Medicare Other

## 2017-06-10 ENCOUNTER — Encounter (HOSPITAL_COMMUNITY): Payer: Self-pay | Admitting: Internal Medicine

## 2017-06-10 ENCOUNTER — Emergency Department (HOSPITAL_COMMUNITY): Payer: Medicare Other

## 2017-06-10 ENCOUNTER — Observation Stay (HOSPITAL_BASED_OUTPATIENT_CLINIC_OR_DEPARTMENT_OTHER): Payer: Medicare Other

## 2017-06-10 DIAGNOSIS — E039 Hypothyroidism, unspecified: Secondary | ICD-10-CM | POA: Diagnosis not present

## 2017-06-10 DIAGNOSIS — R110 Nausea: Secondary | ICD-10-CM | POA: Insufficient documentation

## 2017-06-10 DIAGNOSIS — Z7901 Long term (current) use of anticoagulants: Secondary | ICD-10-CM | POA: Insufficient documentation

## 2017-06-10 DIAGNOSIS — R61 Generalized hyperhidrosis: Secondary | ICD-10-CM | POA: Diagnosis not present

## 2017-06-10 DIAGNOSIS — R0600 Dyspnea, unspecified: Secondary | ICD-10-CM | POA: Diagnosis not present

## 2017-06-10 DIAGNOSIS — I4821 Permanent atrial fibrillation: Secondary | ICD-10-CM | POA: Diagnosis present

## 2017-06-10 DIAGNOSIS — R079 Chest pain, unspecified: Secondary | ICD-10-CM | POA: Diagnosis not present

## 2017-06-10 DIAGNOSIS — I483 Typical atrial flutter: Secondary | ICD-10-CM | POA: Diagnosis not present

## 2017-06-10 DIAGNOSIS — Z79899 Other long term (current) drug therapy: Secondary | ICD-10-CM | POA: Insufficient documentation

## 2017-06-10 DIAGNOSIS — R251 Tremor, unspecified: Secondary | ICD-10-CM | POA: Diagnosis present

## 2017-06-10 DIAGNOSIS — I5032 Chronic diastolic (congestive) heart failure: Secondary | ICD-10-CM | POA: Insufficient documentation

## 2017-06-10 DIAGNOSIS — I110 Hypertensive heart disease with heart failure: Secondary | ICD-10-CM | POA: Insufficient documentation

## 2017-06-10 DIAGNOSIS — I10 Essential (primary) hypertension: Secondary | ICD-10-CM | POA: Diagnosis present

## 2017-06-10 DIAGNOSIS — R7989 Other specified abnormal findings of blood chemistry: Secondary | ICD-10-CM | POA: Diagnosis not present

## 2017-06-10 DIAGNOSIS — R0602 Shortness of breath: Secondary | ICD-10-CM | POA: Diagnosis not present

## 2017-06-10 LAB — BASIC METABOLIC PANEL
Anion gap: 8 (ref 5–15)
BUN: 13 mg/dL (ref 6–20)
CALCIUM: 8.9 mg/dL (ref 8.9–10.3)
CO2: 23 mmol/L (ref 22–32)
CREATININE: 0.87 mg/dL (ref 0.44–1.00)
Chloride: 106 mmol/L (ref 101–111)
GFR calc non Af Amer: >60 mL/min (ref >60)
Glucose, Bld: 114 mg/dL — ABNORMAL HIGH (ref 65–99)
Potassium: 5.6 mmol/L — ABNORMAL HIGH (ref 3.5–5.1)
Sodium: 137 mmol/L (ref 135–145)

## 2017-06-10 LAB — I-STAT TROPONIN, ED
TROPONIN I, POC: 0.04 ng/mL (ref 0.00–0.08)
Troponin i, poc: 0.06 ng/mL (ref 0.00–0.08)

## 2017-06-10 LAB — CBC WITH DIFFERENTIAL/PLATELET
BASOS ABS: 0 10*3/uL (ref 0.0–0.1)
Basophils Relative: 0 %
EOS PCT: 1 %
Eosinophils Absolute: 0.1 10*3/uL (ref 0.0–0.7)
LYMPHS ABS: 1.2 10*3/uL (ref 0.7–4.0)
LYMPHS PCT: 12 %
MONO ABS: 1 10*3/uL (ref 0.1–1.0)
MONOS PCT: 9 %
Neutro Abs: 7.9 10*3/uL — ABNORMAL HIGH (ref 1.7–7.7)
Neutrophils Relative %: 78 %

## 2017-06-10 LAB — CBC
HCT: 38.3 % (ref 36.0–46.0)
HEMATOCRIT: 37.7 % (ref 36.0–46.0)
HEMOGLOBIN: 13.1 g/dL (ref 12.0–15.0)
Hemoglobin: 13.1 g/dL (ref 12.0–15.0)
MCH: 32.5 pg (ref 26.0–34.0)
MCH: 32 pg (ref 26.0–34.0)
MCHC: 34.7 g/dL (ref 30.0–36.0)
MCHC: 34.2 g/dL (ref 30.0–36.0)
MCV: 93.5 fL (ref 78.0–100.0)
MCV: 93.6 fL (ref 78.0–100.0)
PLATELETS: 152 10*3/uL (ref 150–400)
Platelets: 144 10*3/uL — ABNORMAL LOW (ref 150–400)
RBC: 4.03 MIL/uL (ref 3.87–5.11)
RBC: 4.09 MIL/uL (ref 3.87–5.11)
RDW: 13.7 % (ref 11.5–15.5)
RDW: 13.5 % (ref 11.5–15.5)
WBC: 10.4 10*3/uL (ref 4.0–10.5)
WBC: 9.5 10*3/uL (ref 4.0–10.5)

## 2017-06-10 LAB — TROPONIN I: TROPONIN I: 0.06 ng/mL — AB (ref <0.03)

## 2017-06-10 LAB — D-DIMER, QUANTITATIVE (NOT AT ARMC): D DIMER QUANT: 0.3 ug{FEU}/mL (ref 0.00–0.50)

## 2017-06-10 LAB — NM MYOCAR MULTI W/SPECT W/WALL MOTION / EF
CSEPEDS: 0 s
CSEPEW: 1 METS
CSEPPHR: 112 {beats}/min
Exercise duration (min): 0 min
MPHR: 138 {beats}/min
Rest HR: 74 {beats}/min

## 2017-06-10 LAB — BRAIN NATRIURETIC PEPTIDE: B Natriuretic Peptide: 182 pg/mL — ABNORMAL HIGH (ref 0.0–100.0)

## 2017-06-10 LAB — PROTIME-INR
INR: 2.97
Prothrombin Time: 30.7 seconds — ABNORMAL HIGH (ref 11.4–15.2)

## 2017-06-10 IMAGING — CR DG CHEST 2V
2 series · 2 of 2 positions shown · non-contrast
Comparison: Chest radiograph dated [DATE]

CLINICAL DATA: 84-year-old female with shortness of breath.

EXAM:
CHEST  2 VIEW

[chest lat]
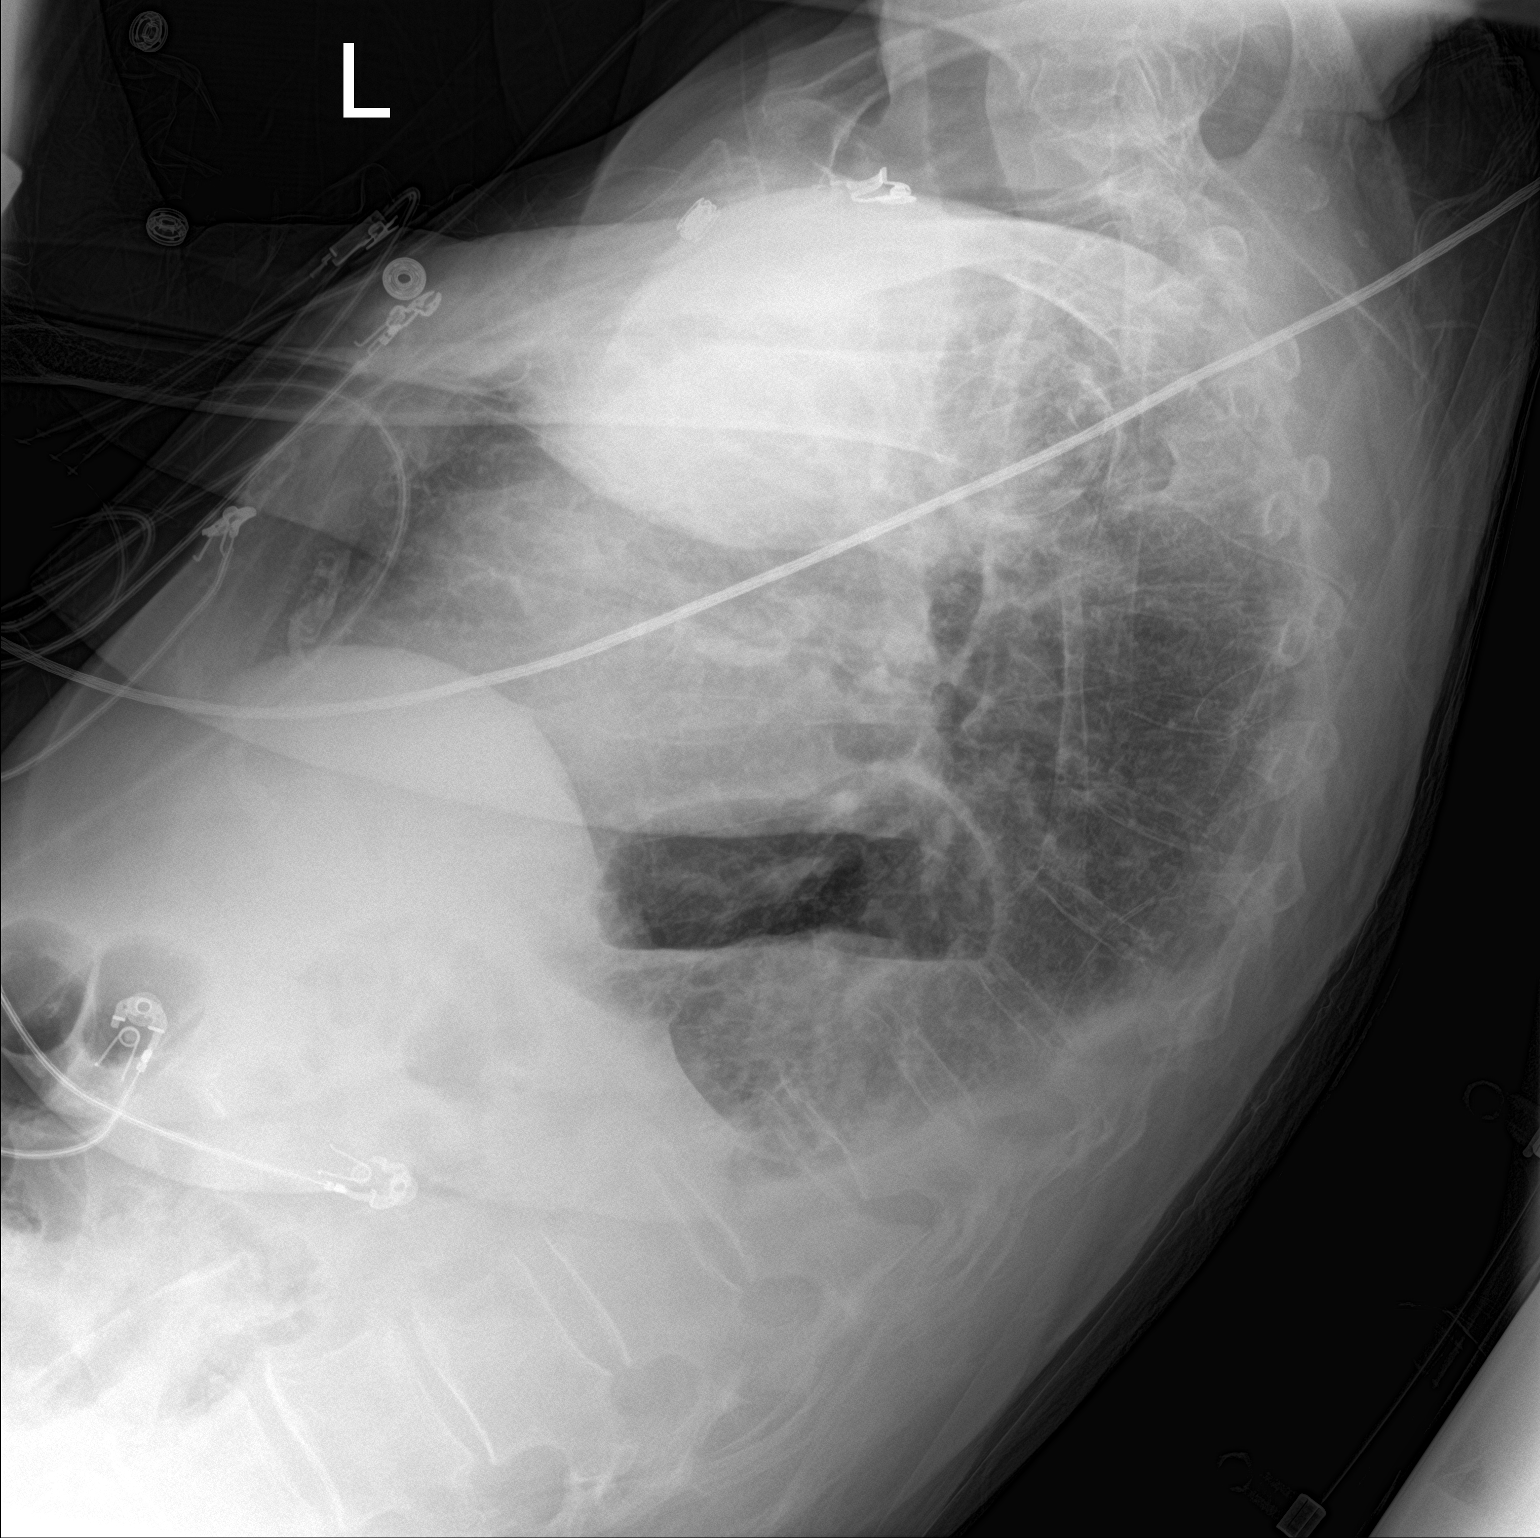

[chest ap]
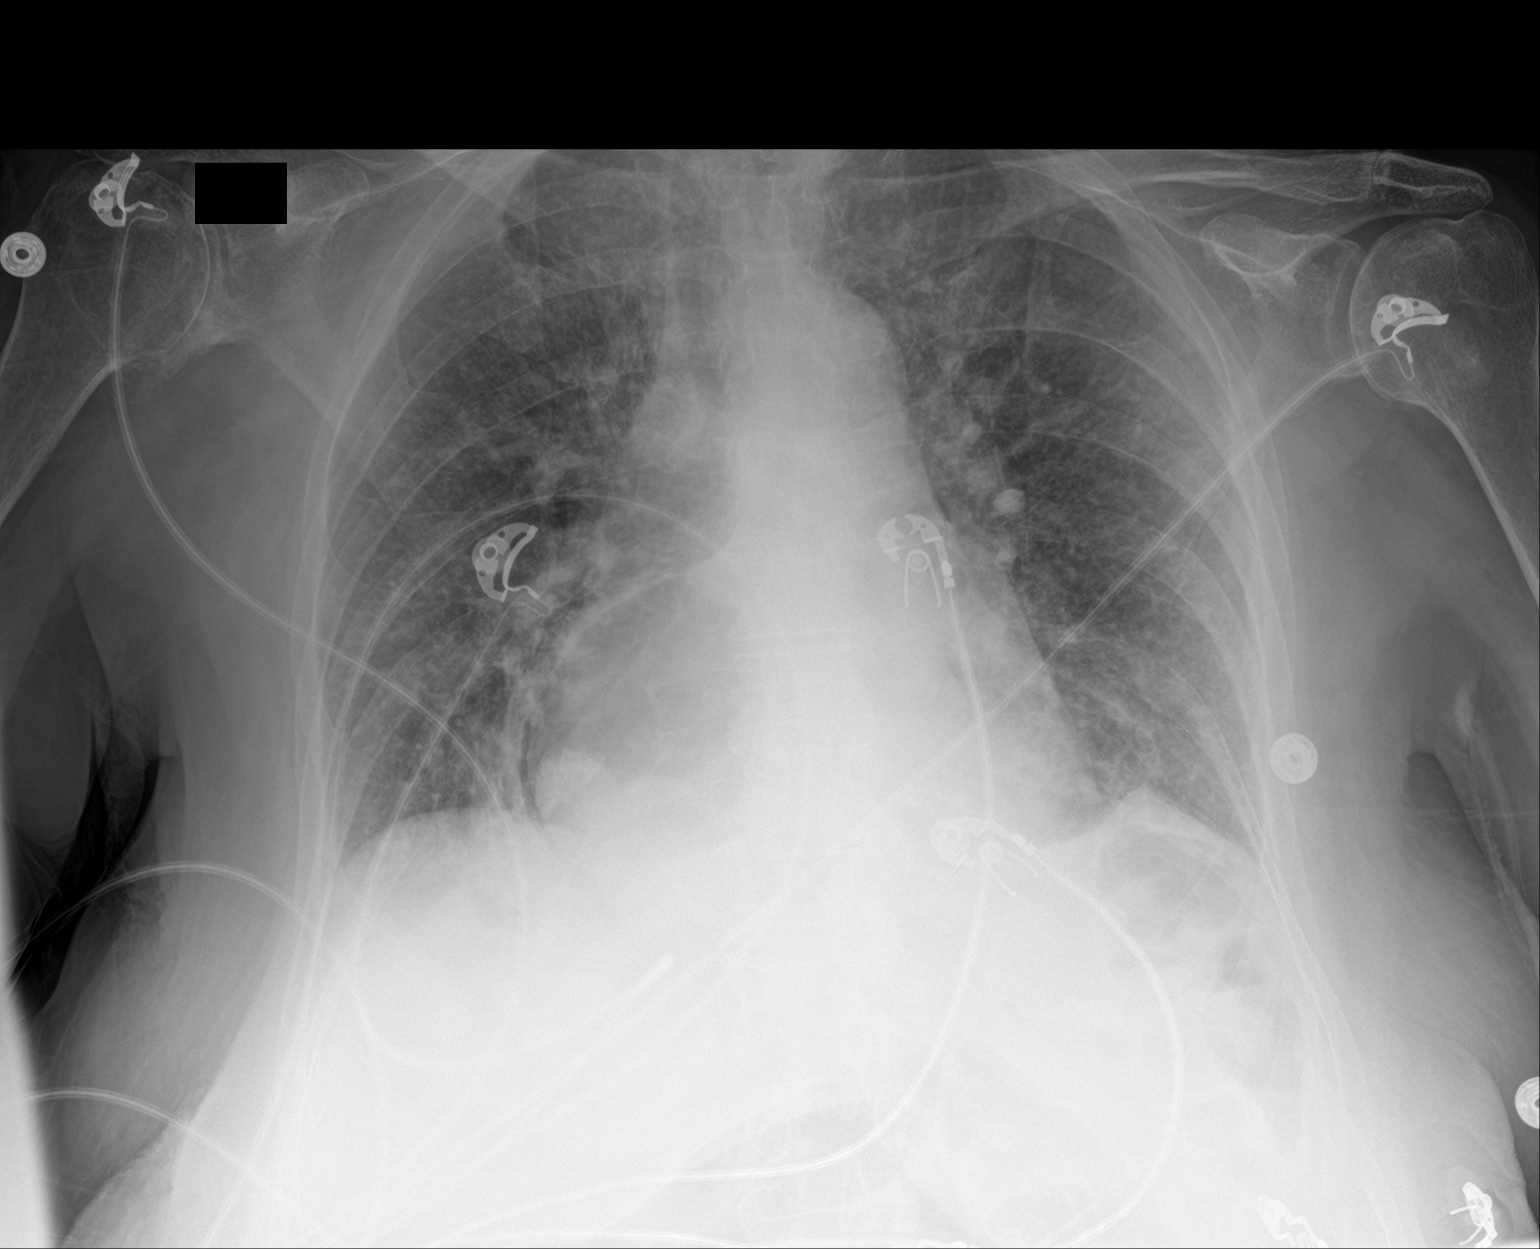

[2 of 2 positions shown; findings below may reference images not displayed]

FINDINGS: There is shallow inspiration. Chronic bronchitic changes noted. Mild
prominence of the central vasculature may represent mild vascular
congestion. There is no focal consolidation. There is blunting of
the costophrenic angle on the lateral view likely related to small
pleural effusion. A moderate size hiatal hernia is again noted.
IMPRESSION: 1. Shallow inspiration with atelectatic changes and probable small
pleural effusion.
2. Probable mild vascular congestion.
3. Moderate hiatal hernia.

## 2017-06-10 MED ORDER — REGADENOSON 0.4 MG/5ML IV SOLN
INTRAVENOUS | Status: AC
  Administered 2017-06-10: 0.4 mg via INTRAVENOUS
  Filled 2017-06-10: qty 5

## 2017-06-10 MED ORDER — CLONAZEPAM 0.125 MG PO TBDP
0.2500 mg | ORAL_TABLET | Freq: Two times a day (BID) | ORAL | Status: DC
  Administered 2017-06-10: 0.25 mg via ORAL
  Filled 2017-06-10: qty 2

## 2017-06-10 MED ORDER — WARFARIN SODIUM 2.5 MG PO TABS: 2.5000 mg | ORAL_TABLET | ORAL | Status: DC

## 2017-06-10 MED ORDER — TECHNETIUM TC 99M TETROFOSMIN IV KIT
30.0000 | PACK | Freq: Once | INTRAVENOUS | Status: AC | PRN
  Administered 2017-06-10: 30 via INTRAVENOUS

## 2017-06-10 MED ORDER — ONDANSETRON HCL 4 MG/2ML IJ SOLN
4.0000 mg | Freq: Four times a day (QID) | INTRAMUSCULAR | Status: DC | PRN
  Administered 2017-06-10: 4 mg via INTRAVENOUS

## 2017-06-10 MED ORDER — FUROSEMIDE 10 MG/ML IJ SOLN
40.0000 mg | Freq: Once | INTRAMUSCULAR | Status: AC
  Administered 2017-06-10: 40 mg via INTRAVENOUS
  Filled 2017-06-10: qty 4

## 2017-06-10 MED ORDER — METOPROLOL TARTRATE 50 MG PO TABS
50.0000 mg | ORAL_TABLET | Freq: Two times a day (BID) | ORAL | Status: DC
  Administered 2017-06-10: 50 mg via ORAL
  Filled 2017-06-10: qty 1

## 2017-06-10 MED ORDER — PANTOPRAZOLE SODIUM 20 MG PO TBEC
20.0000 mg | DELAYED_RELEASE_TABLET | Freq: Every day | ORAL | Status: DC
  Administered 2017-06-10: 20 mg via ORAL
  Filled 2017-06-10: qty 1

## 2017-06-10 MED ORDER — LEVOTHYROXINE SODIUM 88 MCG PO TABS
88.0000 ug | ORAL_TABLET | Freq: Every day | ORAL | Status: DC
  Administered 2017-06-10: 88 ug via ORAL
  Filled 2017-06-10 (×2): qty 1

## 2017-06-10 MED ORDER — ONDANSETRON HCL 4 MG/2ML IJ SOLN
INTRAMUSCULAR | Status: AC
  Administered 2017-06-10: 4 mg via INTRAVENOUS
  Filled 2017-06-10: qty 2

## 2017-06-10 MED ORDER — MORPHINE SULFATE (PF) 4 MG/ML IV SOLN
4.0000 mg | Freq: Once | INTRAVENOUS | Status: AC
  Administered 2017-06-10: 4 mg via INTRAVENOUS
  Filled 2017-06-10: qty 1

## 2017-06-10 MED ORDER — REGADENOSON 0.4 MG/5ML IV SOLN
0.4000 mg | Freq: Once | INTRAVENOUS | Status: AC
Start: 2017-06-10 — End: 2017-06-10
  Administered 2017-06-10: 0.4 mg via INTRAVENOUS

## 2017-06-10 MED ORDER — TECHNETIUM TC 99M TETROFOSMIN IV KIT
10.0000 | PACK | Freq: Once | INTRAVENOUS | Status: AC | PRN
  Administered 2017-06-10: 10 via INTRAVENOUS

## 2017-06-10 MED ORDER — ACETAMINOPHEN 325 MG PO TABS: 650.0000 mg | ORAL_TABLET | ORAL | Status: DC | PRN

## 2017-06-10 MED ORDER — NITROGLYCERIN 0.4 MG SL SUBL
0.4000 mg | SUBLINGUAL_TABLET | SUBLINGUAL | Status: DC | PRN
  Administered 2017-06-10: 0.4 mg via SUBLINGUAL
  Filled 2017-06-10: qty 1

## 2017-06-10 MED ORDER — GENTAMICIN SULFATE 0.1 % EX CREA
1.0000 "application " | TOPICAL_CREAM | Freq: Three times a day (TID) | CUTANEOUS | Status: DC
  Administered 2017-06-10: 1 via TOPICAL
  Filled 2017-06-10: qty 15

## 2017-06-10 MED ORDER — WARFARIN SODIUM 5 MG PO TABS: 5.0000 mg | ORAL_TABLET | ORAL | Status: DC

## 2017-06-10 MED ORDER — WARFARIN - PHARMACIST DOSING INPATIENT: Freq: Every day | Status: DC

## 2017-06-10 NOTE — Progress Notes (Addendum)
   [name] presented for a 1-day Lexiscan cardiolite today.  No immediate complications.  Stress imaging is pending at this time.  Erma Heritage, PA-C 06/10/2017, 10:25 AM    Stress test results as below:  IMPRESSION: 1. No reversible ischemia or infarction.  2. Normal left ventricular wall motion.  3. Left ventricular ejection fraction 68%  4. Non invasive risk stratification*: Low  Reviewed with patient. Admitting team made aware.   Signed, Erma Heritage, PA-C 06/10/2017, 2:17 PM Pager: [phone]

## 2017-06-10 NOTE — Progress Notes (Signed)
ANTICOAGULATION CONSULT NOTE - Initial Consult  Pharmacy Consult for Coumadin Indication: atrial fibrillation  Allergies  Allergen Reactions  . Iodinated Diagnostic Agents Shortness Of Breath    headache  . Penicillins Other (See Comments)    Tolerated Zosyn Oct 2018    Patient Measurements: Height: 5\' 8"  (172.7 cm) Weight: 205 lb (93 kg) IBW/kg (Calculated) : 63.9  Vital Signs: Temp: 98.8 F (37.1 C) (12/28 0105) Temp Source: Oral (12/28 0105) BP: 116/69 (12/28 0530) Pulse Rate: 71 (12/28 0530)  Labs: Recent Labs    06/10/17 0111 06/10/17 0150 06/10/17 9323  HGB  --  DUPLICATE REFER TO [id number] 02.5  HCT  --  DUPLICATE REFER TO [id number] 37.6  PLT  --  DUPLICATE REFER TO [id number] 152  LABPROT  --  30.7*  --   INR  --  2.97  --   CREATININE 0.87  --   --     Estimated Creatinine Clearance: 59.4 mL/min (by C-G formula based on SCr of 0.87 mg/dL).   Medical History: Past Medical History:  Diagnosis Date  . Anxiety   . Arthritis    "left knee" (04/05/2017)  . Atrial fibrillation ([place])   . Atrial flutter ([place])    typical appearing  . Atrial tachycardia ([place])    ablated 11/17/10  by JA  from the [facility] of the aorta  . Chronic lower back pain   . Dizziness    chronic and of an unclear etiology  . Gastritis   . GERD (gastroesophageal reflux disease)   . History of blood transfusion ~ 1948  . HTN (hypertension)   . Hyperlipemia   . Hypothyroidism   . Obesity   . Osteoporosis   . Sinus headache   . Vitamin D deficiency     Assessment: 83yo female admitted for CP, to continue Coumadin for Afib; current INR at goal w/ last dose of Coumadin taken 12/27.  Goal of Therapy:  INR 2-3   Plan:  Will continue home Coumadin dose of 5mg  daily except 2.5mg  on Sunday and monitor INR.  Wynona Neat, PharmD, BCPS  06/10/2017,6:05 AM

## 2017-06-10 NOTE — Consult Note (Signed)
Cardiology Consultation:   Patient ID: Charlotte Castro; [id number]; Nov 06, 1932   Admit date: 06/10/2017 Date of Consult: 06/10/2017  Primary Care Provider: Leeroy Cha, MD Primary Cardiologist: Dr. Irish Lack   Patient Profile:   Charlotte Castro is an 83 y.o. female with a PMH HTN, atrial fibrillation on coumadin, chronic diastolic CHF,  GERD, hypothyroidism, OA, and B12 deficiency who is being seen today for the evaluation of chest pain at the request of Dr. Verlon Au.  History of Present Illness:   Charlotte Castro is an 83 y.o. female with a PMH HTN, atrial fibrillation on coumadin, chronic diastolic CHF,  GERD, hypothyroidism, OA, and B12 deficiency who presented to [facility] ED 06/10/17 with complaints of chest pain. Patient is a poor historian. States she was feeling poorly 06/09/17 but unable to elaborate. She states her chest pain started while doing house chores. Described as lower central chest pain which radiated upwards from her epigastric region. She noted some chest burning and associated SOB. Unable to state how long her episode of CP lasted. Has had some nausea but no vomiting. Currently chest pain free. Very concerned about her L foot pain for which she has seen podiatry and neurology outpatient. Denies fever, cough, abdominal pain.     Past Medical History:  Diagnosis Date  . Anxiety   . Arthritis    "left knee" (04/05/2017)  . Atrial fibrillation ([place])   . Atrial flutter ([place])    typical appearing  . Atrial tachycardia ([place])    ablated 11/17/10  by JA  from [facility] of the aorta  . Chronic lower back pain   . Dizziness    chronic and of an unclear etiology  . Gastritis   . GERD (gastroesophageal reflux disease)   . History of blood transfusion ~ 1948  . HTN (hypertension)   . Hyperlipemia   . Hypothyroidism   . Obesity   . Osteoporosis   . Sinus headache   . Vitamin D deficiency     Past Surgical History:  Procedure Laterality Date  . ATRIAL ABLATION SURGERY   11/17/10   Atrial tachycardia arising from [facility] of the aorta ablated by JA  . BACK SURGERY    . CARDIAC CATHETERIZATION  01/11/2011   Archie Endo 01/12/2011  . CHOLECYSTECTOMY N/A 04/08/2017   Procedure: LAPAROSCOPIC CHOLECYSTECTOMY;  Surgeon: Georganna Skeans, MD;  Location: [place];  Service: General;  Laterality: N/A;  . FRACTURE SURGERY    . LUMBAR SPINE SURGERY  ~ 1998  . SHOULDER SURGERY Right 1984 X2   Rollene Rotunda 01/19/2011  . WRIST FRACTURE SURGERY Left 1983      Inpatient Medications: Scheduled Meds: . clonazePAM  0.25-0.5 mg Oral BID  . gentamicin cream  1 application Topical TID  . levothyroxine  88 mcg Oral QAC breakfast  . metoprolol tartrate  50 mg Oral BID  . pantoprazole  20 mg Oral Daily  . [START ON 06/12/2017] warfarin  2.5 mg Oral Q Sun-1800  . warfarin  5 mg Oral Once per day on Mon Tue Wed Thu Fri Sat  . Warfarin - Pharmacist Dosing Inpatient   Does not apply q1800   Continuous Infusions:  PRN Meds: acetaminophen, nitroGLYCERIN, ondansetron (ZOFRAN) IV  Allergies:    Allergies  Allergen Reactions  . Iodinated Diagnostic Agents Shortness Of Breath    headache  . Penicillins Other (See Comments)    Tolerated Zosyn Oct 2018    Social History:   Social History   Socioeconomic History  . Marital  status: Widowed    Spouse name: Not on file  . Number of children: 2  . Years of education: 42  . Highest education level: Not on file  Social Needs  . Financial resource strain: Not on file  . Food insecurity - worry: Not on file  . Food insecurity - inability: Not on file  . Transportation needs - medical: Not on file  . Transportation needs - non-medical: Not on file  Occupational History  . Not on file  Tobacco Use  . Smoking status: Never Smoker  . Smokeless tobacco: Never Used  Substance and Sexual Activity  . Alcohol use: No  . Drug use: No  . Sexual activity: No  Other Topics Concern  . Not on file  Social History Narrative   Lives alone in a one story  home.  Has 2 children.  Retired.  Education: high school.      Family History:    Family History  Problem Relation Age of Onset  . Heart attack Father   . Hypertension Father      ROS:  Please see the history of present illness.   All other ROS reviewed and negative.     Physical Exam/Data:   Vitals:   06/10/17 0500 06/10/17 0530 06/10/17 0600 06/10/17 0639  BP: (!) 106/55 116/69 112/69 (!) 113/55  Pulse: 67 71 64 77  Resp: 20 (!) 22 14   Temp:    98.5 F (36.9 C)  TempSrc:    Oral  SpO2: 99% 100% 99% 96%  Weight:    201 lb 11.2 oz (91.5 kg)  Height:       No intake or output data in the 24 hours ending 06/10/17 0811 Filed Weights   06/10/17 0100 06/10/17 0639  Weight: 205 lb (93 kg) 201 lb 11.2 oz (91.5 kg)   Body mass index is 30.67 kg/m.  General:  Elderly female laying in bed in no acute distress HEENT: sclera anicteric  Neck: no JVD Vascular: distal pulses 2+ bilaterally Cardiac:  normal S1, S2; RRR; no murmur Lungs:  clear to auscultation bilaterally, no wheezing, rhonchi or rales  Abd: NABS, soft, nontender, no hepatomegaly Ext: no edema; chronic venous stasis changes to b/l LE. Ulcers noted on R great toe and L 3rd toe.  Musculoskeletal:  No deformities Skin: warm and dry  Neuro:  CNs 2-12 intact, no focal abnormalities noted; slight jaw tremor at rest Psych:  Normal affect   EKG:  The EKG was personally reviewed and demonstrates: sinus rhythm with artifact. Submm ST depressions in lateral leads. No STE, no TWI Telemetry:  Telemetry was personally reviewed and demonstrates:  NSR with intermittent atrial fibrillation  Relevant CV Studies: NST and ECHO today  Laboratory Data:  Chemistry Recent Labs  Lab 06/10/17 0111  NA 137  K 5.6*  CL 106  CO2 23  GLUCOSE 114*  BUN 13  CREATININE 0.87  CALCIUM 8.9  GFRNONAA >60  GFRAA >60  ANIONGAP 8    No results for input(s): PROT, ALBUMIN, AST, ALT, ALKPHOS, BILITOT in the last 168  hours. Hematology Recent Labs  Lab 06/10/17 0111 06/10/17 0150 06/10/17 4742  WBC 59.5 DUPLICATE REFER TO [id number] 9.5  RBC 4.33 DUPLICATE REFER TO [id number] 4.16  HGB 60.6 DUPLICATE REFER TO [id number] 09.3  HCT 23.5 DUPLICATE REFER TO [id number] 25.4  MCV 27.0 DUPLICATE REFER TO [id number] 83.1  MCH 51.7 DUPLICATE REFER TO [id number] 71.0  MCHC 62.6 DUPLICATE REFER TO [id number] 34.2  RDW  29.9 DUPLICATE REFER TO [id number] 78.9  PLT 381* DUPLICATE REFER TO [id number] 152   Cardiac Enzymes Recent Labs  Lab 06/10/17 0541  TROPONINI 0.06*    Recent Labs  Lab 06/10/17 0124 06/10/17 0441  TROPIPOC 0.06 0.04    BNP Recent Labs  Lab 06/10/17 0111  BNP 182.0*    DDimer  Recent Labs  Lab 06/10/17 0541  DDIMER 0.30    Radiology/Studies:  Dg Chest 2 View  Result Date: 06/10/2017 CLINICAL DATA:  83 year old female with shortness of breath. EXAM: CHEST  2 VIEW COMPARISON:  Chest radiograph dated 10/20/2016 FINDINGS: There is shallow inspiration. Chronic bronchitic changes noted. Mild prominence of the central vasculature may represent mild vascular congestion. There is no focal consolidation. There is blunting of the costophrenic angle on the lateral view likely related to small pleural effusion. A moderate size hiatal hernia is again noted. IMPRESSION: 1. Shallow inspiration with atelectatic changes and probable small pleural effusion. 2. Probable mild vascular congestion. 3. Moderate hiatal hernia. Electronically Signed   By: Anner Crete M.D.   On: 06/10/2017 01:55    Assessment and Plan:   1. CP: patient with atypical chest pain more likely GI in origin, however poor historian. Last LHC  In 12/2010 revealed no evidence of CAD, however very small mid-to-distal LAD and c/f vasospasm.  - Troponin 0.06 - continue to trend to peak - EKG with submm ST depressions in lateral leads - continue serial EKGs  - Ddimer negative - Will obtain NST today - NPO this AM  2. HTN: BP well controlled - Continue  home metoprolol  3. Afib: no evidence of RVR on telemetry. Follows closely at the coumadin clinic. - Continue home metoprolol - Continue warfarin per pharmacy  4. Chronic diastolic CHF: Pt endorses some SOB, however does not appear volume overloaded on exam - BNP 182 - CXR with mild vascular congestion - s/p IV lasix 40mg  in ED - will monitor symptoms and reassess in AM need for further diuretics - Continue home metoprolol   For questions or updates, please contact [facility] Please consult [email or web address] for contact info under Cardiology/STEMI.   Signed, Abigail Butts, PA-C  06/10/2017 8:11 AM [phone]  Patient examined chart reviewed discussed care with PA and patient. Atypical pain with no acute ECG changes and  Troponin only .06. Given anticoagulation and atypical features will order lexiscan myovue for today to risk stratify She is euvolemic and maintaining NSR. She has calloused ulcers on her right foot / toes She has excellent pulses and I suspect these are from neuropathy and poor fitting shoes/ orthopedic issues and not embolic events   Jenkins Rouge

## 2017-06-10 NOTE — ED Provider Notes (Signed)
[facility] EMERGENCY DEPARTMENT Provider Note   CSN: [id number] Arrival date & time: 06/10/17  0048     History   Chief Complaint Chief Complaint  Patient presents with  . Chest Pain    HPI Charlotte Castro is a 83 y.o. female.  The history is provided by the patient.  This evening, she started having some shortness of breath and a pressure feeling in her upper sternal area.  There was also associated nausea and diaphoresis.  She tried to lay down to see if it would get better, but it did not and she called for an ambulance.  EMS gave her aspirin and nitroglycerin with partial improvement.  She still is complaining of some mild dyspnea and mild pressure feeling in her chest.  She has a history of atrial fibrillation and is anticoagulated on warfarin.  She also recently had a cholecystectomy for gallstone pancreatitis.  Other significant past history includes hypertension, hyperlipidemia, hypothyroidism chronic diastolic heart failure.  Past Medical History:  Diagnosis Date  . Anxiety   . Arthritis    "left knee" (04/05/2017)  . Atrial fibrillation ([place])   . Atrial flutter ([place])    typical appearing  . Atrial tachycardia ([place])    ablated 11/17/10  by JA  from the [facility] of the aorta  . Chronic lower back pain   . Dizziness    chronic and of an unclear etiology  . Gastritis   . GERD (gastroesophageal reflux disease)   . History of blood transfusion ~ 1948  . HTN (hypertension)   . Hyperlipemia   . Hypothyroidism   . Obesity   . Osteoporosis   . Sinus headache   . Vitamin D deficiency     Patient Active Problem List   Diagnosis Date Noted  . Persistent atrial fibrillation ([place])   . Acute gallstone pancreatitis 04/05/2017  . Abdominal pain, acute, epigastric 04/05/2017  . Choledocholithiasis with obstruction 04/05/2017  . Acute delirium 04/14/2016  . Tremor 04/14/2016  . Weakness 04/10/2016  . Elevated CK 04/10/2016  . Chronic diastolic CHF  (congestive heart failure) ([place]) 04/10/2016  . UTI (urinary tract infection) 04/09/2016  . Dehydration   . Fatigue 03/29/2014  . Essential hypertension, benign 01/18/2014  . Long term current use of anticoagulant therapy 01/18/2014  . Encounter for therapeutic drug monitoring 07/13/2013  . Bradycardia 09/16/2012  . Permanent atrial fibrillation ([place]) 06/10/2011  . Dizziness 12/24/2010    Past Surgical History:  Procedure Laterality Date  . ATRIAL ABLATION SURGERY  11/17/10   Atrial tachycardia arising from [facility] of the aorta ablated by JA  . BACK SURGERY    . CARDIAC CATHETERIZATION  01/11/2011   Archie Endo 01/12/2011  . CHOLECYSTECTOMY N/A 04/08/2017   Procedure: LAPAROSCOPIC CHOLECYSTECTOMY;  Surgeon: Georganna Skeans, MD;  Location: [facility];  Service: General;  Laterality: N/A;  . FRACTURE SURGERY    . LUMBAR SPINE SURGERY  ~ 1998  . SHOULDER SURGERY Right 1984 X2   Rollene Rotunda 01/19/2011  . WRIST FRACTURE SURGERY Left 1983    OB History    No data available       Home Medications    Prior to Admission medications   Medication Sig Start Date End Date Taking? Authorizing Provider  acetaminophen (TYLENOL) 325 MG tablet Take 325 mg by mouth every 4 (four) hours as needed for headache (pain). Reported on 10/04/2015    [provider]  Calcium Carbonate-Vitamin D (CALCIUM-VITAMIN D) 600-200 MG-UNIT CAPS Take 1 capsule by mouth  daily. Reported on 10/04/2015    [provider]  Cholecalciferol (VITAMIN D3) 1000 UNITS CAPS Take 1,000 Units by mouth daily. Reported on 10/04/2015    [provider]  clonazePAM (KLONOPIN) 0.5 MG tablet Take 0.5-1 tablets (0.25-0.5 mg total) by mouth 2 (two) times daily. 04/12/17   Dessa Phi, DO  Cyanocobalamin 1000 MCG/ML KIT Inject 1,000 mcg as directed every 30 (thirty) days.    [provider]  gentamicin cream (GARAMYCIN) 0.1 % Apply 1 application topically 3 (three) times daily. 05/11/17   Edrick Kins, DPM  levothyroxine  (SYNTHROID, LEVOTHROID) 88 MCG tablet Take 1 tablet (88 mcg total) by mouth daily before breakfast. 04/21/16   Lauree Chandler, NP  metoprolol (LOPRESSOR) 50 MG tablet Take 1 tablet (50 mg total) by mouth 2 (two) times daily. 04/21/16   Lauree Chandler, NP  [place]  Peripheral Neuropathy Cream- Bupivacaine 1%, Doxepin 3%, Gabapentin 6%, Pentoxifylline 3%, Topiramate 1% Apply 1-2 grams to affected area 3-4 times daily Qty. 120 gm 3 refills    [provider]  ondansetron (ZOFRAN) 4 MG tablet Take 4 mg by mouth every 8 (eight) hours as needed for nausea or vomiting.    [provider]  OVER THE COUNTER MEDICATION Place 1 drop into both eyes daily as needed (dry eyes). Over the counter lubricating eye drop    [provider]  pantoprazole (PROTONIX) 20 MG tablet Take 1 tablet (20 mg total) by mouth daily. 04/13/17 05/13/17  Dessa Phi, DO  warfarin (COUMADIN) 5 MG tablet Take 1/2 to 1 tablet daily as directed by coumadin clinic Patient taking differently: Take 2.5-5 mg by mouth See admin instructions. 2.5 mg on Sunday-all other days 5 mg 03/09/17   Jettie Booze, MD    Family History Family History  Problem Relation Age of Onset  . Heart attack Father   . Hypertension Father     Social History Social History   Tobacco Use  . Smoking status: Never Smoker  . Smokeless tobacco: Never Used  Substance Use Topics  . Alcohol use: No  . Drug use: No     Allergies   Iodinated diagnostic agents and Penicillins   Review of Systems Review of Systems  All other systems reviewed and are negative.    Physical Exam Updated Vital Signs BP 129/63 (BP Location: Right Arm)   Pulse 76   Temp 98.8 F (37.1 C) (Oral)   Resp 19   Ht 5' 8"  (1.727 m)   Wt 93 kg (205 lb)   SpO2 98%   BMI 31.17 kg/m   Physical Exam  Nursing note and vitals reviewed.  83 year old female, resting comfortably and in no acute distress. Vital  signs are normal. Oxygen saturation is 98%, which is normal. Head is normocephalic and atraumatic. PERRLA, EOMI. Oropharynx is clear. Neck is nontender and supple without adenopathy or JVD. Back is nontender and there is no CVA tenderness. Lungs are clear without rales, wheezes, or rhonchi. Chest is nontender. Heart has regular rate and rhythm without murmur. Abdomen is soft, flat, nontender without masses or hepatosplenomegaly and peristalsis is normoactive. Extremities have no cyanosis or edema, full range of motion is present. Skin is warm and dry without rash. Neurologic: Mental status is normal, cranial nerves are intact, there are no motor or sensory deficits.  Moderate resting tremor is present.  ED Treatments / Results  Labs (all labs ordered are listed, but only abnormal results are  displayed) Labs Reviewed  BASIC METABOLIC PANEL - Abnormal; Notable for the following components:      Result Value   Potassium 5.6 (*)    Glucose, Bld 114 (*)    All other components within normal limits  CBC WITH DIFFERENTIAL/PLATELET - Abnormal; Notable for the following components:   Neutro Abs 7.9 (*)    All other components within normal limits  BRAIN NATRIURETIC PEPTIDE - Abnormal; Notable for the following components:   B Natriuretic Peptide 182.0 (*)    All other components within normal limits  PROTIME-INR - Abnormal; Notable for the following components:   Prothrombin Time 30.7 (*)    All other components within normal limits  TROPONIN I - Abnormal; Notable for the following components:   Troponin I 0.06 (*)    All other components within normal limits  CBC - Abnormal; Notable for the following components:   Platelets 144 (*)    All other components within normal limits  CBC  D-DIMER, QUANTITATIVE (NOT AT [facility])  TROPONIN I  TROPONIN I  HEPATIC FUNCTION PANEL  LIPASE, BLOOD  I-STAT TROPONIN, ED  I-STAT TROPONIN, ED    EKG  EKG Interpretation  Date/Time:  Friday June 10 2017 01:01:35 EST Ventricular Rate:  81 PR Interval:    QRS Duration: 91 QT Interval:  380 QTC Calculation: 442 R Axis:   62 Text Interpretation:  Sinus rhythm Short PR interval Borderline ST depression, lateral leads Baseline wander in lead(s) V2 When compared with ECG of 04/05/2017, Sinus rhythm has replaced Atrial fibrillation with rapid ventricular response REPOLARIZATION ABNORMALITY is no longer present - probably was rate-related Confirmed by Delora Fuel ([id number]) on 06/10/2017 1:14:55 AM       Radiology Dg Chest 2 View  Result Date: 06/10/2017 CLINICAL DATA:  83 year old female with shortness of breath. EXAM: CHEST  2 VIEW COMPARISON:  Chest radiograph dated 10/20/2016 FINDINGS: There is shallow inspiration. Chronic bronchitic changes noted. Mild prominence of the central vasculature may represent mild vascular congestion. There is no focal consolidation. There is blunting of the costophrenic angle on the lateral view likely related to small pleural effusion. A moderate size hiatal hernia is again noted. IMPRESSION: 1. Shallow inspiration with atelectatic changes and probable small pleural effusion. 2. Probable mild vascular congestion. 3. Moderate hiatal hernia. Electronically Signed   By: Anner Crete M.D.   On: 06/10/2017 01:55    Procedures Procedures (including critical care time)  Medications Ordered in ED Medications  nitroGLYCERIN (NITROSTAT) SL tablet 0.4 mg (0.4 mg Sublingual Given 06/10/17 0320)  clonazePAM (KLONOPIN) disintegrating tablet 0.25-0.5 mg (not administered)  gentamicin cream (GARAMYCIN) 0.1 % 1 application (not administered)  levothyroxine (SYNTHROID, LEVOTHROID) tablet 88 mcg (not administered)  metoprolol tartrate (LOPRESSOR) tablet 50 mg (not administered)  pantoprazole (PROTONIX) EC tablet 20 mg (not administered)  acetaminophen (TYLENOL) tablet 650 mg (not administered)  ondansetron (ZOFRAN) injection 4 mg (not administered)  Warfarin -  Pharmacist Dosing Inpatient (not administered)  warfarin (COUMADIN) tablet 5 mg (not administered)  warfarin (COUMADIN) tablet 2.5 mg (not administered)  furosemide (LASIX) injection 40 mg (40 mg Intravenous Given 06/10/17 0318)  morphine 4 MG/ML injection 4 mg (4 mg Intravenous Given 06/10/17 0318)     Initial Impression / Assessment and Plan / ED Course  I have reviewed the triage vital signs and the nursing notes.  Pertinent labs & imaging results that were available during my care of the patient were reviewed by me and considered in my medical decision  making (see chart for details).  Chest discomfort and dyspnea worrisome for angina.  ECG shows no acute changes.  She had partial response to nitroglycerin, so we will give additional nitroglycerin.  Old records are reviewed confirming recent cholecystectomy.  No recent INR is are on the chart since she has been at a nursing care facility.  Of note, cardiac catheterization in July 2012 showed no significant coronary artery disease, but probable spasm in the LAD.  She did not have any additional relief with additional nitroglycerin.  She is given morphine.  BNP is somewhat elevated and she is given a dose of furosemide.  INR has come back therapeutic.  Case is discussed with Dr. Hal Hope of [facility], who agrees to admit the patient.  Final Clinical Impressions(s) / ED Diagnoses   Final diagnoses:  Chest pain, unspecified type  Elevated brain natriuretic peptide (BNP) level  Anticoagulated on warfarin    ED Discharge Orders    None       Delora Fuel, MD 12/50/87 [phone]

## 2017-06-10 NOTE — Progress Notes (Signed)
CRITICAL VALUE ALERT  Critical Value:  Trop 0.06  Date & Time Notied:  0650 06/10/2017   Provider Notified: [place]     Orders Received/Actions taken:

## 2017-06-10 NOTE — H&P (Addendum)
History and Physical    Charlotte Castro CVE:[id number] DOB: 05-25-1933 DOA: 06/10/2017  PCP: Leeroy Cha, MD  Patient coming from: Home.  Chief Complaint: Chest pain.  HPI: Charlotte Castro is a 83 y.o. female with history of permanent atrial fibrillation, hypothyroidism was brought to the ER after patient complained of chest pain.  Patient is a poor historian.  Patient states that she had chest pain yesterday which was around the sternal area with some burning sensation and shortness of breath.  Not able to exactly say how long it happened.  Patient states she did go to a podiatrist yesterday and had some cream prescribed for her left middle toe wound.  Patient denies any fever chills productive cough.  ED Course: In the ER patient chest pain improved with sublingual nitroglycerin but still persistent.  Chest x-ray showed mild left femoral effusion and BNP was around 180.  ER physician had given 1 dose of Lasix 40 mg IV.  Patient admitted for further workup of chest pain.  EKG shows normal sinus rhythm with nonspecific changes.  Review of Systems: As per HPI, rest all negative.   Past Medical History:  Diagnosis Date  . Anxiety   . Arthritis    "left knee" (04/05/2017)  . Atrial fibrillation ([place])   . Atrial flutter ([place])    typical appearing  . Atrial tachycardia ([place])    ablated 11/17/10  by JA  from the [facility] of the aorta  . Chronic lower back pain   . Dizziness    chronic and of an unclear etiology  . Gastritis   . GERD (gastroesophageal reflux disease)   . History of blood transfusion ~ 1948  . HTN (hypertension)   . Hyperlipemia   . Hypothyroidism   . Obesity   . Osteoporosis   . Sinus headache   . Vitamin D deficiency     Past Surgical History:  Procedure Laterality Date  . ATRIAL ABLATION SURGERY  11/17/10   Atrial tachycardia arising from [facility] of the aorta ablated by JA  . BACK SURGERY    . CARDIAC CATHETERIZATION  01/11/2011   Archie Endo 01/12/2011  .  CHOLECYSTECTOMY N/A 04/08/2017   Procedure: LAPAROSCOPIC CHOLECYSTECTOMY;  Surgeon: Georganna Skeans, MD;  Location: [facility];  Service: General;  Laterality: N/A;  . FRACTURE SURGERY    . LUMBAR SPINE SURGERY  ~ 1998  . SHOULDER SURGERY Right 1984 X2   Rollene Rotunda 01/19/2011  . WRIST FRACTURE SURGERY Left 1983     reports that  has never smoked. she has never used smokeless tobacco. She reports that she does not drink alcohol or use drugs.  Allergies  Allergen Reactions  . Iodinated Diagnostic Agents Shortness Of Breath    headache  . Penicillins Other (See Comments)    Tolerated Zosyn Oct 2018    Family History  Problem Relation Age of Onset  . Heart attack Father   . Hypertension Father     Prior to Admission medications   Medication Sig Start Date End Date Taking? Authorizing Provider  acetaminophen (TYLENOL) 325 MG tablet Take 325 mg by mouth every 4 (four) hours as needed for headache (pain). Reported on 10/04/2015   Yes [provider]  Calcium Carbonate-Vitamin D (CALCIUM-VITAMIN D) 600-200 MG-UNIT CAPS Take 1 capsule by mouth daily. Reported on 10/04/2015   Yes [provider]  Cholecalciferol (VITAMIN D3) 1000 UNITS CAPS Take 1,000 Units by mouth daily. Reported on 10/04/2015   Yes [provider]  clonazePAM (  KLONOPIN) 0.5 MG tablet Take 0.5-1 tablets (0.25-0.5 mg total) by mouth 2 (two) times daily. 04/12/17  Yes Dessa Phi, DO  Cyanocobalamin 1000 MCG/ML KIT Inject 1,000 mcg as directed every 30 (thirty) days.   Yes [provider]  gentamicin cream (GARAMYCIN) 0.1 % Apply 1 application topically 3 (three) times daily. 05/11/17  Yes Edrick Kins, DPM  levothyroxine (SYNTHROID, LEVOTHROID) 88 MCG tablet Take 1 tablet (88 mcg total) by mouth daily before breakfast. 04/21/16  Yes Lauree Chandler, NP  metoprolol (LOPRESSOR) 50 MG tablet Take 1 tablet (50 mg total) by mouth 2 (two) times daily. 04/21/16  Yes Lauree Chandler, NP  [place]  Peripheral Neuropathy Cream- Bupivacaine 1%, Doxepin 3%, Gabapentin 6%, Pentoxifylline 3%, Topiramate 1% Apply 1-2 grams to affected area 3-4 times daily Qty. 120 gm 3 refills   Yes [provider]  ondansetron (ZOFRAN) 4 MG tablet Take 4 mg by mouth every 8 (eight) hours as needed for nausea or vomiting.   Yes [provider]  OVER THE COUNTER MEDICATION Place 1 drop into both eyes daily as needed (dry eyes). Over the counter lubricating eye drop   Yes [provider]  pantoprazole (PROTONIX) 20 MG tablet Take 1 tablet (20 mg total) by mouth daily. 04/13/17 06/11/23 Yes Dessa Phi, DO  warfarin (COUMADIN) 5 MG tablet Take 1/2 to 1 tablet daily as directed by coumadin clinic Patient taking differently: Take 2.5-5 mg by mouth See admin instructions. Take 1/2 tablet on Sunday then take 1 tablet all there other days 03/09/17  Yes Jettie Booze, MD    Physical Exam: Vitals:   06/10/17 0300 06/10/17 0330 06/10/17 0400 06/10/17 0430  BP: 111/62 (!) 102/58 (!) 113/55 114/62  Pulse: 65 76 68 68  Resp: _0 Temp:      TempSrc:      SpO2: 94% 96% 95% 97%  Weight:      Height:          Constitutional: Moderately built and nourished. Vitals:   06/10/17 0300 06/10/17 0330 06/10/17 0400 06/10/17 0430  BP: 111/62 (!) 102/58 (!) 113/55 114/62  Pulse: 65 76 68 68  Resp: _1 Temp:      TempSrc:      SpO2: 94% 96% 95% 97%  Weight:      Height:       Eyes: Anicteric no pallor. ENMT: No discharge from the ears eyes nose or mouth. Neck: No mass palpated no JVD appreciated. Respiratory: No rhonchi or crepitations. Cardiovascular: S1-S2 heard no murmurs appreciated. Abdomen: Soft nontender bowel sounds present. Musculoskeletal: Left middle toe has callus and wound on the plantar aspect. Skin: Wound on the mid left middle toe plantar aspect. Neurologic: Alert awake oriented to time place and person.  Moves all  extremities. Psychiatric: Appears normal.   Labs on Admission: I have personally reviewed following labs and imaging studies  CBC: Recent Labs  Lab 37/90/24 0973  WBC DUPLICATE REFER TO [id number]  NEUTROABS 7.9*  HGB DUPLICATE REFER TO [id number]  HCT DUPLICATE REFER TO [id number]  MCV DUPLICATE REFER TO [id number]  PLT DUPLICATE REFER TO [id number]   Basic Metabolic Panel: Recent Labs  Lab 06/10/17 0111  NA 137  K 5.6*  CL 106  CO2 23  GLUCOSE 114*  BUN 13  CREATININE 0.87  CALCIUM 8.9   GFR: Estimated Creatinine Clearance: 59.4 mL/min (by C-G formula based on SCr of 0.87 mg/dL).  Liver Function Tests: No results for input(s): AST, ALT, ALKPHOS, BILITOT, PROT, ALBUMIN in the last 168 hours. No results for input(s): LIPASE, AMYLASE in the last 168 hours. No results for input(s): AMMONIA in the last 168 hours. Coagulation Profile: Recent Labs  Lab 06/10/17 0150  INR 2.97   Cardiac Enzymes: No results for input(s): CKTOTAL, CKMB, CKMBINDEX, TROPONINI in the last 168 hours. BNP (last 3 results) No results for input(s): PROBNP in the last 8760 hours. HbA1C: No results for input(s): HGBA1C in the last 72 hours. CBG: No results for input(s): GLUCAP in the last 168 hours. Lipid Profile: No results for input(s): CHOL, HDL, LDLCALC, TRIG, CHOLHDL, LDLDIRECT in the last 72 hours. Thyroid Function Tests: No results for input(s): TSH, T4TOTAL, FREET4, T3FREE, THYROIDAB in the last 72 hours. Anemia Panel: No results for input(s): VITAMINB12, FOLATE, FERRITIN, TIBC, IRON, RETICCTPCT in the last 72 hours. Urine analysis:    Component Value Date/Time   COLORURINE YELLOW 04/05/2017 2041   APPEARANCEUR CLEAR 04/05/2017 2041   LABSPEC 1.015 04/05/2017 2041   PHURINE 7.0 04/05/2017 2041   GLUCOSEU NEGATIVE 04/05/2017 2041   HGBUR NEGATIVE 04/05/2017 2041   [place] NEGATIVE 04/05/2017 2041   KETONESUR NEGATIVE 04/05/2017 2041   PROTEINUR NEGATIVE 04/05/2017 2041   UROBILINOGEN 2.0 (H)  11/18/2010 0019   NITRITE POSITIVE (A) 04/05/2017 2041   LEUKOCYTESUR NEGATIVE 04/05/2017 2041   Sepsis Labs: _0 (procalcitonin:4,lacticidven:4) )No results found for this or any previous visit (from the past 240 hour(s)).   Radiological Exams on Admission: Dg Chest 2 View  Result Date: 06/10/2017 CLINICAL DATA:  83 year old female with shortness of breath. EXAM: CHEST  2 VIEW COMPARISON:  Chest radiograph dated 10/20/2016 FINDINGS: There is shallow inspiration. Chronic bronchitic changes noted. Mild prominence of the central vasculature may represent mild vascular congestion. There is no focal consolidation. There is blunting of the costophrenic angle on the lateral view likely related to small pleural effusion. A moderate size hiatal hernia is again noted. IMPRESSION: 1. Shallow inspiration with atelectatic changes and probable small pleural effusion. 2. Probable mild vascular congestion. 3. Moderate hiatal hernia. Electronically Signed   By: Anner Crete M.D.   On: 06/10/2017 01:55    EKG: Independently reviewed.  Sinus tachycardia with nonspecific ST changes.  Assessment/Plan Principal Problem:   Chest pain Active Problems:   Permanent atrial fibrillation (HCC)   Essential hypertension, benign   Chronic diastolic CHF (congestive heart failure) (HCC)   Tremor    1. Chest pain -patient is a poor historian so not able to exactly characterize the type of pain.  We will cycle cardiac markers keep patient on PRN nitroglycerin patient is on Coumadin.  Will check 2D echo d-dimer and I have requested cardiology consult. 2. Shortness of breath -patient was given 1 dose of Lasix in the ER.  Chest x-ray was showing mild pleural effusion.  Check 2D echo and response to Lasix. 3. Permanent atrial fibrillation -patient is presently in sinus rhythm.  On Coumadin per pharmacy.  On metoprolol. 4. Hypothyroidism on Synthroid. 5. Left middle toe wound -being followed by podiatrist patient  is on an antibiotic cream now.   DVT prophylaxis: Coumadin. Code Status: Full code. Family Communication: Discussed with patient. Disposition Plan: Home. Consults called: Cardiology. Admission status: Observation.   Rise Patience MD [facility] Pager [phone].  If 7PM-7AM, please contact night-coverage [email or web address] Password [facility]  06/10/2017, 5:31 AM

## 2017-06-10 NOTE — Discharge Summary (Signed)
Physician Discharge Summary  MAKEBA DELCASTILLO KJZ:[id number] DOB: [birth year] DOA: 06/10/2017  PCP: Leeroy Cha, MD  Admit date: 06/10/2017 Discharge date: 06/10/2017  Time spent: 25 minutes  Recommendations for Outpatient Follow-up:  1. Needs OP GI input 2. No changes to meds on d/c home  Discharge Diagnoses:  Principal Problem:   Chest pain Active Problems:   Permanent atrial fibrillation (HCC)   Essential hypertension, benign   Chronic diastolic CHF (congestive heart failure) (HCC)   Tremor   Discharge Condition:  improved  Diet recommendation: hh   Filed Weights   06/10/17 0100 06/10/17 9480  Weight: 93 kg (205 lb) 91.5 kg (201 lb 11.2 oz)    History of present illness:   83 fem Chr Afib Chad2vas>4, Hypothyroid, Chr Diastolic HF, GERD  L Middle toe wound-has had Cardiac cath 12/2010 small section LAd and Vasospasm Admit with CP and troponin 0.06-EKg showed [facility] Course:  CP ROMI-seen by cardiology Stress testing done and negatvie and low-risk so likely non-cardiac and can d/c home later today to home Chr Afib Chad2vasc2 >4-cont metoprolol 50 bid, cont Coumadin at home dose-INR 2.9 Hypothyroid-cont synthroid 88 mcg, TSH 1 mo on f/iu Reflux-cont patorprazole 20 qd L Middle toe wound-cont Garamycin cream as OP-good pulses-need WOund care as follow up     Procedures: stress test 1 day-neg  Consultations:  Cardiology   Discharge Exam: Vitals:   06/10/17 1232 06/10/17 1314  BP: (!) 140/93 101/64  Pulse: 84 71  Resp: 16   Temp: 97.9 F (36.6 C)   SpO2: 98%     General: awake alert in nad-mildly anxious Cardiovascular:  s1 s2 no m/r/g Respiratory: clear no added sound abd soft nt nd no rebound  Discharge Instructions   Discharge Instructions    Diet - low sodium heart healthy   Complete by:  As directed    Discharge instructions   Complete by:  As directed    You had a negative stress test-there is very low risk of this  being cardiac-you probably will need Outpatient GI input regarding the same   Increase activity slowly   Complete by:  As directed      Allergies as of 06/10/2017      Reactions   Iodinated Diagnostic Agents Shortness Of Breath   headache   Penicillins Other (See Comments)   Tolerated Zosyn Oct 2018      Medication List    TAKE these medications   acetaminophen 325 MG tablet Commonly known as:  TYLENOL Take 325 mg by mouth every 4 (four) hours as needed for headache (pain). Reported on 10/04/2015   Calcium-Vitamin D 600-200 MG-UNIT Caps Take 1 capsule by mouth daily. Reported on 10/04/2015   clonazePAM 0.5 MG tablet Commonly known as:  KLONOPIN Take 0.5-1 tablets (0.25-0.5 mg total) by mouth 2 (two) times daily.   Cyanocobalamin 1000 MCG/ML Kit Inject 1,000 mcg as directed every 30 (thirty) days.   gentamicin cream 0.1 % Commonly known as:  GARAMYCIN Apply 1 application topically 3 (three) times daily.   levothyroxine 88 MCG tablet Commonly known as:  SYNTHROID, LEVOTHROID Take 1 tablet (88 mcg total) by mouth daily before breakfast.   metoprolol tartrate 50 MG tablet Commonly known as:  LOPRESSOR Take 1 tablet (50 mg total) by mouth 2 (two) times daily.   NON FORMULARY [facility]  Peripheral Neuropathy Cream- Bupivacaine 1%, Doxepin 3%, Gabapentin 6%, Pentoxifylline 3%, Topiramate 1% Apply 1-2 grams to affected area 3-4 times daily Qty.  120 gm 3 refills   ondansetron 4 MG tablet Commonly known as:  ZOFRAN Take 4 mg by mouth every 8 (eight) hours as needed for nausea or vomiting.   OVER THE COUNTER MEDICATION Place 1 drop into both eyes daily as needed (dry eyes). Over the counter lubricating eye drop   pantoprazole 20 MG tablet Commonly known as:  PROTONIX Take 1 tablet (20 mg total) by mouth daily.   Vitamin D3 1000 units Caps Take 1,000 Units by mouth daily. Reported on 10/04/2015   warfarin 5 MG tablet Commonly known as:  COUMADIN Take as  directed. If you are unsure how to take this medication, talk to your nurse or doctor. Original instructions:  Take 1/2 to 1 tablet daily as directed by coumadin clinic What changed:    how much to take  how to take this  when to take this  additional instructions      Allergies  Allergen Reactions  . Iodinated Diagnostic Agents Shortness Of Breath    headache  . Penicillins Other (See Comments)    Tolerated Zosyn Oct 2018      The results of significant diagnostics from this hospitalization (including imaging, microbiology, ancillary and laboratory) are listed below for reference.    Significant Diagnostic Studies: Dg Chest 2 View  Result Date: 06/10/2017 CLINICAL DATA:  83 year old female with shortness of breath. EXAM: CHEST  2 VIEW COMPARISON:  Chest radiograph dated 10/20/2016 FINDINGS: There is shallow inspiration. Chronic bronchitic changes noted. Mild prominence of the central vasculature may represent mild vascular congestion. There is no focal consolidation. There is blunting of the costophrenic angle on the lateral view likely related to small pleural effusion. A moderate size hiatal hernia is again noted. IMPRESSION: 1. Shallow inspiration with atelectatic changes and probable small pleural effusion. 2. Probable mild vascular congestion. 3. Moderate hiatal hernia. Electronically Signed   By: Anner Crete M.D.   On: 06/10/2017 01:55   Nm Myocar Multi W/spect W/wall Motion / Ef  Result Date: 06/10/2017 CLINICAL DATA:  Chest pain. Atrial fibrillation, hyperlipidemia and shortness of breath. EXAM: MYOCARDIAL IMAGING WITH SPECT (REST AND EXERCISE) GATED LEFT VENTRICULAR WALL MOTION STUDY LEFT VENTRICULAR EJECTION FRACTION TECHNIQUE: Standard myocardial SPECT imaging was performed after resting intravenous injection of 10 mCi Tc-2mtetrofosmin. Subsequently, exercise tolerance test was performed by the patient under the supervision of the Cardiology staff. At  peak-stress, 30 mCi Tc-921metrofosmin was injected intravenously and standard myocardial SPECT imaging was performed. Quantitative gated imaging was also performed to evaluate left ventricular wall motion, and estimate left ventricular ejection fraction. COMPARISON:  Chest radiographs 06/10/2017. FINDINGS: Perfusion: No decreased activity in the left ventricle on stress imaging to suggest reversible ischemia or infarction. Decreased activity in the septum likely due to septal thinning and/or attenuation by the adjacent hiatal hernia seen on radiographs. Wall Motion: Normal left ventricular wall motion. No left ventricular dilation. Left Ventricular Ejection Fraction: 68 % End diastolic volume 96 ml End systolic volume 31 ml IMPRESSION: 1. No reversible ischemia or infarction. 2. Normal left ventricular wall motion. 3. Left ventricular ejection fraction 68% 4. Non invasive risk stratification*: Low *2012 Appropriate Use Criteria for Coronary Revascularization Focused Update: J Am Coll Cardiol. 204270;62(3):762-831http://[email or web address]?articleid=[email or web address] Electronically Signed   By: WiRichardean Sale.D.   On: 06/10/2017 13:47    Microbiology: No results found for this or any previous visit (from the past 240 hour(s)).   Labs: Basic Metabolic Panel: Recent Labs  Lab 06/10/17 0111  NA 137  K 5.6*  CL 106  CO2 23  GLUCOSE 114*  BUN 13  CREATININE 0.87  CALCIUM 8.9   Liver Function Tests: No results for input(s): AST, ALT, ALKPHOS, BILITOT, PROT, ALBUMIN in the last 168 hours. No results for input(s): LIPASE, AMYLASE in the last 168 hours. No results for input(s): AMMONIA in the last 168 hours. CBC: Recent Labs  Lab 06/10/17 0111 06/10/17 0150 06/10/17 5339  WBC 17.9 DUPLICATE REFER TO [id number] 9.5  NEUTROABS  --  7.9*  --   HGB 54.2 DUPLICATE REFER TO [id number] 17.2  HCT 09.1 DUPLICATE REFER TO [id number] 19.6  MCV 94.0 DUPLICATE REFER TO [id number] 51.9  PLT 824* DUPLICATE REFER  TO [id number] 152   Cardiac Enzymes: Recent Labs  Lab 06/10/17 0541  TROPONINI 0.06*   BNP: BNP (last 3 results) Recent Labs    06/10/17 0111  BNP 182.0*    ProBNP (last 3 results) No results for input(s): PROBNP in the last 8760 hours.  CBG: No results for input(s): GLUCAP in the last 168 hours.     Signed:  Nita Sells MD   [facility] 06/10/2017, 2:10 PM

## 2017-06-10 NOTE — ED Triage Notes (Signed)
Pt from home via [facility]. C/o central CP that she describes as a pressure. Given 1 NTG & 324 ASA PTA via EMS. Rates pain @ 5/10. Endorses SHOB, nausea, & weakness. Pt A&O x4 on arrival.

## 2017-06-10 NOTE — Progress Notes (Signed)
Pt discharged to home. PIV removed, AVS reviewed. Pt to follow up with GI outpatient. Pt left unit via wheelchair, belongings and personal cane in hand. Pt to be transported home by daughter.

## 2017-06-30 ENCOUNTER — Ambulatory Visit: Payer: Medicare Other | Admitting: Interventional Cardiology

## 2017-07-14 ENCOUNTER — Ambulatory Visit (INDEPENDENT_AMBULATORY_CARE_PROVIDER_SITE_OTHER): Payer: Medicare Other | Admitting: *Deleted

## 2017-07-14 ENCOUNTER — Ambulatory Visit: Payer: Medicare Other | Admitting: Interventional Cardiology

## 2017-07-14 DIAGNOSIS — Z5181 Encounter for therapeutic drug level monitoring: Secondary | ICD-10-CM

## 2017-07-14 DIAGNOSIS — I482 Chronic atrial fibrillation: Secondary | ICD-10-CM

## 2017-07-14 DIAGNOSIS — I4821 Permanent atrial fibrillation: Secondary | ICD-10-CM

## 2017-07-14 LAB — POCT INR: INR: 2.5

## 2017-07-14 NOTE — Patient Instructions (Signed)
Description   Continue  taking Coumadin 1 tablet (5mg ) every day except 1/2 tablet (2.5mg ) on Sundays. Recheck INR in 2 weeks. Call if placed on any new medications [phone] or if you have any upcoming procedures.

## 2017-07-15 ENCOUNTER — Other Ambulatory Visit: Payer: Self-pay | Admitting: Interventional Cardiology

## 2017-07-31 ENCOUNTER — Emergency Department (HOSPITAL_COMMUNITY)
Admission: EM | Admit: 2017-07-31 | Discharge: 2017-07-31 | Disposition: A | Discharge status: Home / Self Care | Payer: Medicare Other | Attending: Emergency Medicine | Admitting: Emergency Medicine

## 2017-07-31 ENCOUNTER — Other Ambulatory Visit: Payer: Self-pay

## 2017-07-31 ENCOUNTER — Encounter (HOSPITAL_COMMUNITY): Payer: Self-pay

## 2017-07-31 DIAGNOSIS — I110 Hypertensive heart disease with heart failure: Secondary | ICD-10-CM | POA: Insufficient documentation

## 2017-07-31 DIAGNOSIS — R319 Hematuria, unspecified: Secondary | ICD-10-CM | POA: Diagnosis present

## 2017-07-31 DIAGNOSIS — N939 Abnormal uterine and vaginal bleeding, unspecified: Secondary | ICD-10-CM | POA: Diagnosis not present

## 2017-07-31 DIAGNOSIS — E039 Hypothyroidism, unspecified: Secondary | ICD-10-CM | POA: Insufficient documentation

## 2017-07-31 DIAGNOSIS — Z79899 Other long term (current) drug therapy: Secondary | ICD-10-CM | POA: Diagnosis not present

## 2017-07-31 DIAGNOSIS — I5032 Chronic diastolic (congestive) heart failure: Secondary | ICD-10-CM | POA: Diagnosis not present

## 2017-07-31 LAB — COMPREHENSIVE METABOLIC PANEL
ALBUMIN: 3.6 g/dL (ref 3.5–5.0)
ALK PHOS: 74 U/L (ref 38–126)
ALT: 12 U/L — ABNORMAL LOW (ref 14–54)
ANION GAP: 13 (ref 5–15)
AST: 28 U/L (ref 15–41)
BUN: 12 mg/dL (ref 6–20)
CALCIUM: 9.6 mg/dL (ref 8.9–10.3)
CO2: 20 mmol/L — AB (ref 22–32)
Chloride: 107 mmol/L (ref 101–111)
Creatinine, Ser: 1.03 mg/dL — ABNORMAL HIGH (ref 0.44–1.00)
GFR calc Af Amer: 57 mL/min — ABNORMAL LOW (ref >60)
GFR calc non Af Amer: 49 mL/min — ABNORMAL LOW (ref >60)
GLUCOSE: 127 mg/dL — AB (ref 65–99)
POTASSIUM: 4.2 mmol/L (ref 3.5–5.1)
SODIUM: 140 mmol/L (ref 135–145)
Total Bilirubin: 1.7 mg/dL — ABNORMAL HIGH (ref 0.3–1.2)
Total Protein: 6.8 g/dL (ref 6.5–8.1)

## 2017-07-31 LAB — LIPASE, BLOOD: Lipase: 31 U/L (ref 11–51)

## 2017-07-31 LAB — URINALYSIS, ROUTINE W REFLEX MICROSCOPIC
BILIRUBIN URINE: NEGATIVE
Glucose, UA: NEGATIVE mg/dL
KETONES UR: NEGATIVE mg/dL
Nitrite: NEGATIVE
PROTEIN: NEGATIVE mg/dL
Specific Gravity, Urine: 1.011 (ref 1.005–1.030)
pH: 7 (ref 5.0–8.0)

## 2017-07-31 LAB — CBC
HEMATOCRIT: 40 % (ref 36.0–46.0)
HEMOGLOBIN: 14.2 g/dL (ref 12.0–15.0)
MCH: 33.2 pg (ref 26.0–34.0)
MCHC: 35.5 g/dL (ref 30.0–36.0)
MCV: 93.5 fL (ref 78.0–100.0)
Platelets: 181 10*3/uL (ref 150–400)
RBC: 4.28 MIL/uL (ref 3.87–5.11)
RDW: 14.1 % (ref 11.5–15.5)
WBC: 8.1 10*3/uL (ref 4.0–10.5)

## 2017-07-31 LAB — PROTIME-INR
INR: 2.59
Prothrombin Time: 27.6 seconds — ABNORMAL HIGH (ref 11.4–15.2)

## 2017-07-31 MED ORDER — FAMOTIDINE 20 MG PO TABS: 20.0000 mg | ORAL_TABLET | Freq: Two times a day (BID) | ORAL | 0 refills | Status: DC

## 2017-07-31 MED ORDER — TRAMADOL HCL 50 MG PO TABS: 50.0000 mg | ORAL_TABLET | Freq: Four times a day (QID) | ORAL | 0 refills | Status: DC | PRN

## 2017-07-31 MED ORDER — SODIUM CHLORIDE 0.9 % IV BOLUS (SEPSIS)
500.0000 mL | Freq: Once | INTRAVENOUS | Status: AC
  Administered 2017-07-31: 500 mL via INTRAVENOUS

## 2017-07-31 NOTE — ED Triage Notes (Signed)
Patient complains of 3 months of intermittent abdominal /bladder pain with hematuria. States that she has been seen by her MD several times and no diagnosis. Denies vomiting, denies diarrhea

## 2017-07-31 NOTE — ED Provider Notes (Signed)
[facility] EMERGENCY DEPARTMENT Provider Note   CSN: [id number] Arrival date & time: 07/31/17  0755     History   Chief Complaint Chief Complaint  Patient presents with  . abd. pain/hematuria    HPI Charlotte Castro is a 84 y.o. female.  84 year old female with prior history of atrial fibrillation on coumadin, hypertension, hyperlipidemia, and arthritis presents with reported hematuria.  Patient reports that she has been having blood in her urine on and off for several weeks. Apparently, the bleeding was worse today so she came to the ED.  She denies associated lightheadedness or dizziness.  She denies chest pain or shortness of breath.  She reports that she is on Coumadin - her last INR check was about 2 weeks ago.  She denies any prior workup for her reported hematuria.   The history is provided by the patient.  Hematuria  This is a new problem. The current episode started more than 1 week ago. The problem occurs daily. The problem has been gradually worsening. Pertinent negatives include no chest pain and no abdominal pain. Nothing aggravates the symptoms. Nothing relieves the symptoms.    Past Medical History:  Diagnosis Date  . Anxiety   . Arthritis    "left knee" (04/05/2017)  . Atrial fibrillation ([place])   . Atrial flutter ([place])    typical appearing  . Atrial tachycardia ([place])    ablated 11/17/10  by JA  from the [facility] of the aorta  . Chronic lower back pain   . Dizziness    chronic and of an unclear etiology  . Gastritis   . GERD (gastroesophageal reflux disease)   . History of blood transfusion ~ 1948  . HTN (hypertension)   . Hyperlipemia   . Hypothyroidism   . Obesity   . Osteoporosis   . Sinus headache   . Vitamin D deficiency     Patient Active Problem List   Diagnosis Date Noted  . Chest pain 06/10/2017  . Persistent atrial fibrillation ([place])   . Acute gallstone pancreatitis 04/05/2017  . Abdominal pain, acute, epigastric 04/05/2017    . Choledocholithiasis with obstruction 04/05/2017  . Acute delirium 04/14/2016  . Tremor 04/14/2016  . Weakness 04/10/2016  . Elevated CK 04/10/2016  . Chronic diastolic CHF (congestive heart failure) ([place]) 04/10/2016  . UTI (urinary tract infection) 04/09/2016  . Dehydration   . Fatigue 03/29/2014  . Essential hypertension, benign 01/18/2014  . Long term current use of anticoagulant therapy 01/18/2014  . Encounter for therapeutic drug monitoring 07/13/2013  . Bradycardia 09/16/2012  . Permanent atrial fibrillation ([place]) 06/10/2011  . Dizziness 12/24/2010    Past Surgical History:  Procedure Laterality Date  . ATRIAL ABLATION SURGERY  11/17/10   Atrial tachycardia arising from [facility] of the aorta ablated by JA  . BACK SURGERY    . CARDIAC CATHETERIZATION  01/11/2011   Archie Endo 01/12/2011  . CHOLECYSTECTOMY N/A 04/08/2017   Procedure: LAPAROSCOPIC CHOLECYSTECTOMY;  Surgeon: Georganna Skeans, MD;  Location: [facility];  Service: General;  Laterality: N/A;  . FRACTURE SURGERY    . LUMBAR SPINE SURGERY  ~ 1998  . SHOULDER SURGERY Right 1984 X2   Rollene Rotunda 01/19/2011  . WRIST FRACTURE SURGERY Left 1983    OB History    No data available       Home Medications    Prior to Admission medications   Medication Sig Start Date End Date Taking? Authorizing Provider  acetaminophen (TYLENOL) 325 MG tablet Take 325  mg by mouth every 4 (four) hours as needed for headache (pain). Reported on 10/04/2015    [provider]  Calcium Carbonate-Vitamin D (CALCIUM-VITAMIN D) 600-200 MG-UNIT CAPS Take 1 capsule by mouth daily. Reported on 10/04/2015    [provider]  Cholecalciferol (VITAMIN D3) 1000 UNITS CAPS Take 1,000 Units by mouth daily. Reported on 10/04/2015    [provider]  clonazePAM (KLONOPIN) 0.5 MG tablet Take 0.5-1 tablets (0.25-0.5 mg total) by mouth 2 (two) times daily. 04/12/17   Dessa Phi, DO  Cyanocobalamin 1000 MCG/ML KIT Inject 1,000 mcg as directed every  30 (thirty) days.    [provider]  gentamicin cream (GARAMYCIN) 0.1 % Apply 1 application topically 3 (three) times daily. 05/11/17   Edrick Kins, DPM  levothyroxine (SYNTHROID, LEVOTHROID) 88 MCG tablet Take 1 tablet (88 mcg total) by mouth daily before breakfast. 04/21/16   Lauree Chandler, NP  metoprolol (LOPRESSOR) 50 MG tablet Take 1 tablet (50 mg total) by mouth 2 (two) times daily. 04/21/16   Lauree Chandler, NP  [place]  Peripheral Neuropathy Cream- Bupivacaine 1%, Doxepin 3%, Gabapentin 6%, Pentoxifylline 3%, Topiramate 1% Apply 1-2 grams to affected area 3-4 times daily Qty. 120 gm 3 refills    [provider]  ondansetron (ZOFRAN) 4 MG tablet Take 4 mg by mouth every 8 (eight) hours as needed for nausea or vomiting.    [provider]  OVER THE COUNTER MEDICATION Place 1 drop into both eyes daily as needed (dry eyes). Over the counter lubricating eye drop    [provider]  pantoprazole (PROTONIX) 20 MG tablet Take 1 tablet (20 mg total) by mouth daily. 04/13/17 06/11/23  Dessa Phi, DO  warfarin (COUMADIN) 5 MG tablet Take 1/2 to 1 tablet daily as directed by coumadin clinic Patient taking differently: Take 2.5-5 mg by mouth See admin instructions. Take 1/2 tablet on Sunday then take 1 tablet all there other days 03/09/17   Jettie Booze, MD    Family History Family History  Problem Relation Age of Onset  . Heart attack Father   . Hypertension Father     Social History Social History   Tobacco Use  . Smoking status: Never Smoker  . Smokeless tobacco: Never Used  Substance Use Topics  . Alcohol use: No  . Drug use: No     Allergies   Iodinated diagnostic agents and Penicillins   Review of Systems Review of Systems  Cardiovascular: Negative for chest pain.  Gastrointestinal: Negative for abdominal pain.  Genitourinary: Positive for hematuria.  All other systems reviewed and are  negative.    Physical Exam Updated Vital Signs BP 124/76   Pulse 72   Temp 97.8 F (36.6 C) (Oral)   Resp 17   Ht 5' 8"  (1.727 m)   Wt 92.1 kg (203 lb)   SpO2 99%   BMI 30.87 kg/m   Physical Exam  Constitutional: She is oriented to person, place, and time. She appears well-developed and well-nourished. No distress.  HENT:  Head: Normocephalic and atraumatic.  Mouth/Throat: Oropharynx is clear and moist.  Eyes: Conjunctivae and EOM are normal. Pupils are equal, round, and reactive to light.  Neck: Normal range of motion. Neck supple.  Cardiovascular: Normal rate, regular rhythm and normal heart sounds.  Pulmonary/Chest: Effort normal and breath sounds normal. No respiratory distress.  Abdominal: Soft. She exhibits no distension. There is no tenderness.  Genitourinary:  Genitourinary Comments: MD present during  cath urine.  Urine is clear.  Frank bleeding from the vagina is present.  Musculoskeletal: Normal range of motion. She exhibits no edema or deformity.  Neurological: She is alert and oriented to person, place, and time.  Skin: Skin is warm and dry.  Psychiatric: She has a normal mood and affect.  Nursing note and vitals reviewed.    ED Treatments / Results  Labs (all labs ordered are listed, but only abnormal results are displayed) Labs Reviewed  COMPREHENSIVE METABOLIC PANEL - Abnormal; Notable for the following components:      Result Value   CO2 20 (*)    Glucose, Bld 127 (*)    Creatinine, Ser 1.03 (*)    ALT 12 (*)    Total Bilirubin 1.7 (*)    GFR calc non Af Amer 49 (*)    GFR calc Af Amer 57 (*)    All other components within normal limits  PROTIME-INR - Abnormal; Notable for the following components:   Prothrombin Time 27.6 (*)    All other components within normal limits  URINALYSIS, ROUTINE W REFLEX MICROSCOPIC - Abnormal; Notable for the following components:   Color, Urine AMBER (*)    APPearance HAZY (*)    Hgb urine dipstick MODERATE  (*)    Leukocytes, UA SMALL (*)    Bacteria, UA RARE (*)    Squamous Epithelial / LPF 0-5 (*)    All other components within normal limits  LIPASE, BLOOD  CBC    EKG  EKG Interpretation None       Radiology No results found.  Procedures Procedures (including critical care time)  Medications Ordered in ED Medications - No data to display   Initial Impression / Assessment and Plan / ED Course  I have reviewed the triage vital signs and the nursing notes.  Pertinent labs & imaging results that were available during my care of the patient were reviewed by me and considered in my medical decision making (see chart for details).     MDM  Screen complete  Patient is presenting with reported hematuria.  Exam suggests that her bleeding is from a vaginal source.  H&H today is stable as compared to prior H&H from 1 month prior.  INR today is 2.5 (on coumadin).  Patient does not appear to be hemodynamically unstable.  Patient's other labs are without significant abnormalities.  Case was discussed with OB/GYN Dr. Kennon Rounds and she will FU as outpatient.  Patient and family are understanding of the plan of care.  They agree with discharge.  They declined further ED workup or treatment.  Strict return precautions given and understood.  Close follow-up is arranged and advised.  Final Clinical Impressions(s) / ED Diagnoses   Final diagnoses:  Vaginal bleeding    ED Discharge Orders    None       Valarie Merino, MD 07/31/17 1441

## 2017-08-01 ENCOUNTER — Inpatient Hospital Stay (HOSPITAL_COMMUNITY): Payer: Medicare Other

## 2017-08-01 ENCOUNTER — Encounter (HOSPITAL_COMMUNITY): Payer: Self-pay | Admitting: *Deleted

## 2017-08-01 ENCOUNTER — Inpatient Hospital Stay (HOSPITAL_COMMUNITY)
Admission: AD | Admit: 2017-08-01 | Discharge: 2017-08-01 | Disposition: A | Discharge status: Home / Self Care | Payer: Medicare Other | Source: Ambulatory Visit | Attending: Obstetrics & Gynecology | Admitting: Obstetrics & Gynecology

## 2017-08-01 DIAGNOSIS — N939 Abnormal uterine and vaginal bleeding, unspecified: Secondary | ICD-10-CM | POA: Insufficient documentation

## 2017-08-01 DIAGNOSIS — M810 Age-related osteoporosis without current pathological fracture: Secondary | ICD-10-CM | POA: Insufficient documentation

## 2017-08-01 DIAGNOSIS — E559 Vitamin D deficiency, unspecified: Secondary | ICD-10-CM | POA: Insufficient documentation

## 2017-08-01 DIAGNOSIS — N950 Postmenopausal bleeding: Secondary | ICD-10-CM | POA: Diagnosis not present

## 2017-08-01 DIAGNOSIS — I4891 Unspecified atrial fibrillation: Secondary | ICD-10-CM | POA: Insufficient documentation

## 2017-08-01 DIAGNOSIS — Z79899 Other long term (current) drug therapy: Secondary | ICD-10-CM | POA: Diagnosis not present

## 2017-08-01 DIAGNOSIS — E669 Obesity, unspecified: Secondary | ICD-10-CM | POA: Insufficient documentation

## 2017-08-01 DIAGNOSIS — Z9049 Acquired absence of other specified parts of digestive tract: Secondary | ICD-10-CM | POA: Insufficient documentation

## 2017-08-01 DIAGNOSIS — E039 Hypothyroidism, unspecified: Secondary | ICD-10-CM | POA: Insufficient documentation

## 2017-08-01 DIAGNOSIS — I10 Essential (primary) hypertension: Secondary | ICD-10-CM | POA: Insufficient documentation

## 2017-08-01 DIAGNOSIS — K219 Gastro-esophageal reflux disease without esophagitis: Secondary | ICD-10-CM | POA: Diagnosis not present

## 2017-08-01 DIAGNOSIS — Z9889 Other specified postprocedural states: Secondary | ICD-10-CM | POA: Insufficient documentation

## 2017-08-01 DIAGNOSIS — Z7902 Long term (current) use of antithrombotics/antiplatelets: Secondary | ICD-10-CM | POA: Insufficient documentation

## 2017-08-01 DIAGNOSIS — E785 Hyperlipidemia, unspecified: Secondary | ICD-10-CM | POA: Diagnosis not present

## 2017-08-01 DIAGNOSIS — I4892 Unspecified atrial flutter: Secondary | ICD-10-CM | POA: Insufficient documentation

## 2017-08-01 DIAGNOSIS — M1712 Unilateral primary osteoarthritis, left knee: Secondary | ICD-10-CM | POA: Insufficient documentation

## 2017-08-01 DIAGNOSIS — Z79890 Hormone replacement therapy (postmenopausal): Secondary | ICD-10-CM | POA: Insufficient documentation

## 2017-08-01 DIAGNOSIS — Z91041 Radiographic dye allergy status: Secondary | ICD-10-CM | POA: Diagnosis not present

## 2017-08-01 DIAGNOSIS — Z6830 Body mass index (BMI) 30.0-30.9, adult: Secondary | ICD-10-CM | POA: Insufficient documentation

## 2017-08-01 DIAGNOSIS — Z7901 Long term (current) use of anticoagulants: Secondary | ICD-10-CM

## 2017-08-01 DIAGNOSIS — Z880 Allergy status to penicillin: Secondary | ICD-10-CM | POA: Insufficient documentation

## 2017-08-01 DIAGNOSIS — Z8249 Family history of ischemic heart disease and other diseases of the circulatory system: Secondary | ICD-10-CM | POA: Diagnosis not present

## 2017-08-01 LAB — CBC
HCT: 36.6 % (ref 36.0–46.0)
Hemoglobin: 12.8 g/dL (ref 12.0–15.0)
MCH: 32.3 pg (ref 26.0–34.0)
MCHC: 35 g/dL (ref 30.0–36.0)
MCV: 92.4 fL (ref 78.0–100.0)
Platelets: 177 10*3/uL (ref 150–400)
RBC: 3.96 MIL/uL (ref 3.87–5.11)
RDW: 14.6 % (ref 11.5–15.5)
WBC: 6.5 10*3/uL (ref 4.0–10.5)

## 2017-08-01 IMAGING — US US PELVIS COMPLETE
1 series · 15 of 20 positions shown · non-contrast
Comparison: CT [DATE]

CLINICAL DATA: Postmenopausal bleeding, on anticoagulants

EXAM:
TRANSABDOMINAL ULTRASOUND OF PELVIS
TECHNIQUE: Transabdominal ultrasound examination of the pelvis was performed
including evaluation of the uterus, ovaries, adnexal regions, and
pelvic cul-de-sac.

[Series 1: us pelvis complete · 15 of 20 slices shown]
[im 1/20]
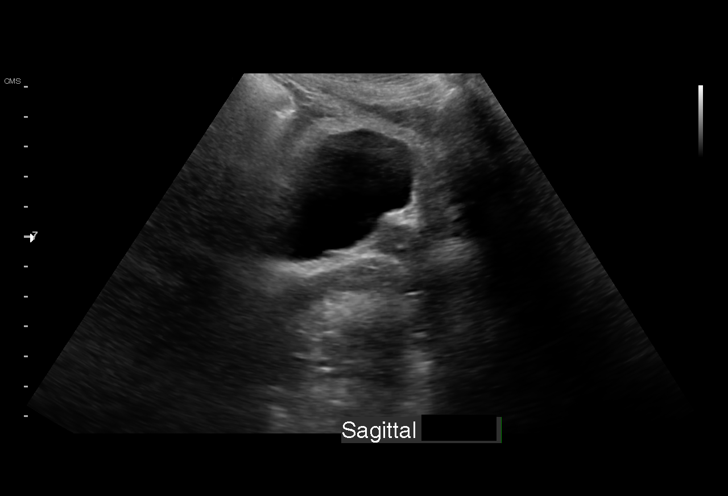
[im 3/20]
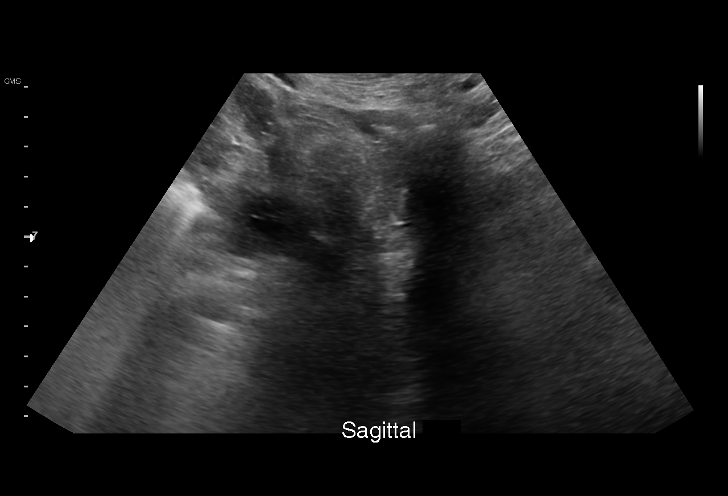
[im 4/20]
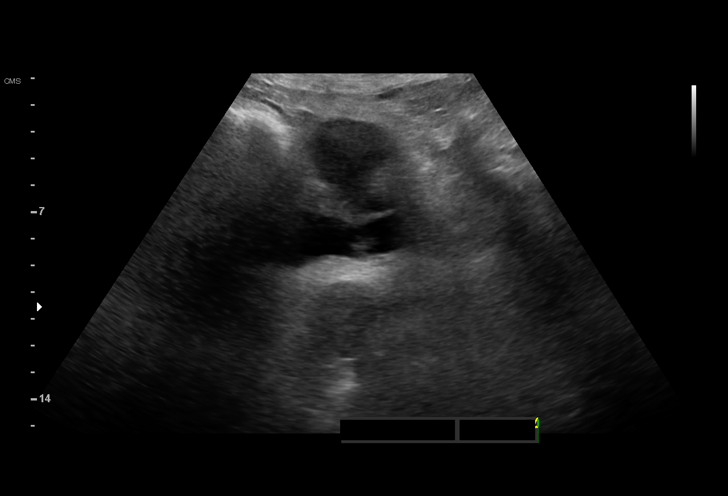
[im 5/20]
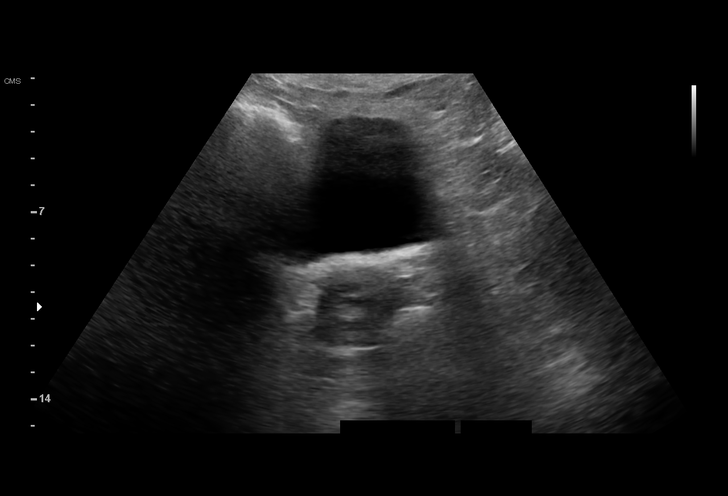
[im 7/20]
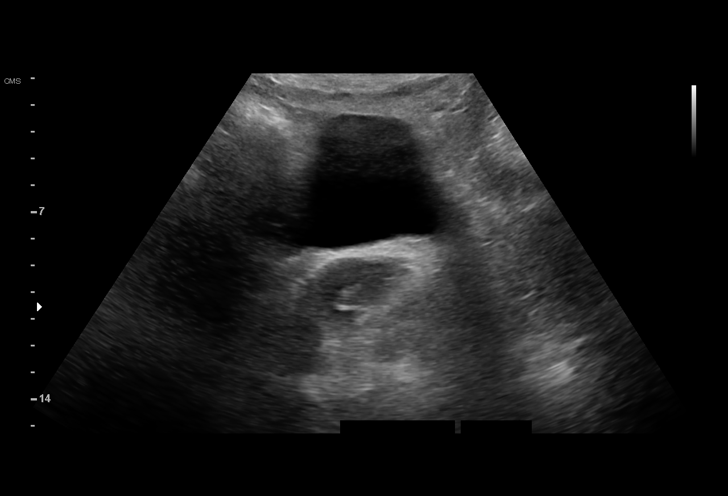
[im 8/20]
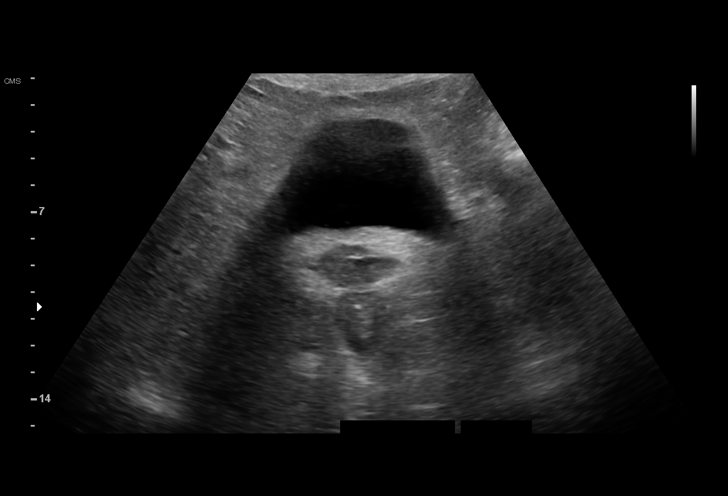
[im 9/20]
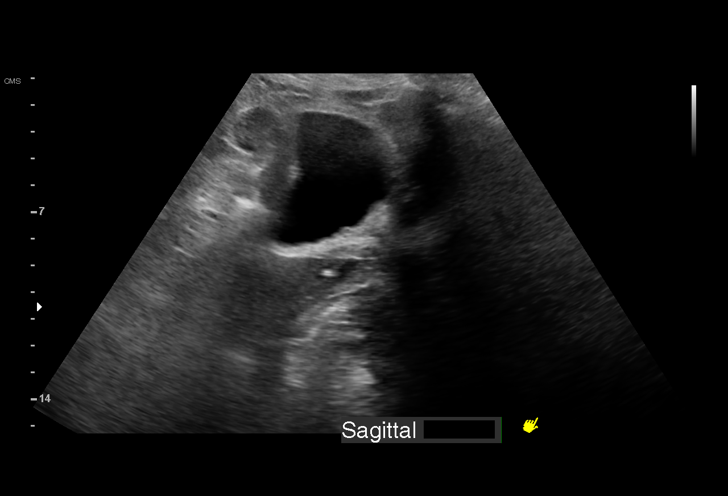
[im 11/20]
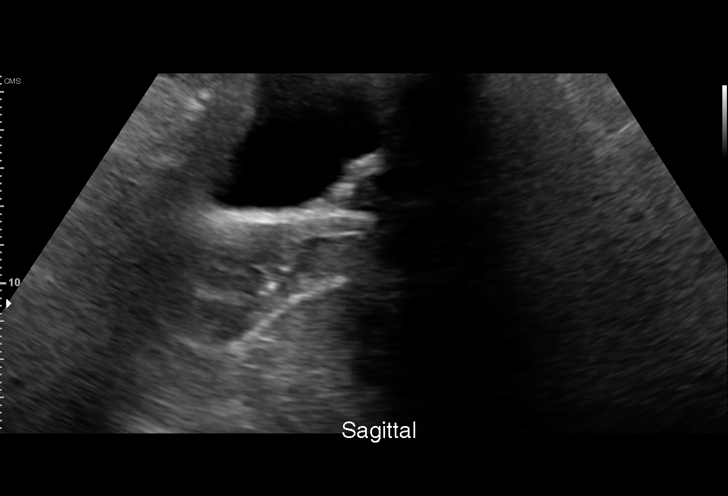
[im 12/20]
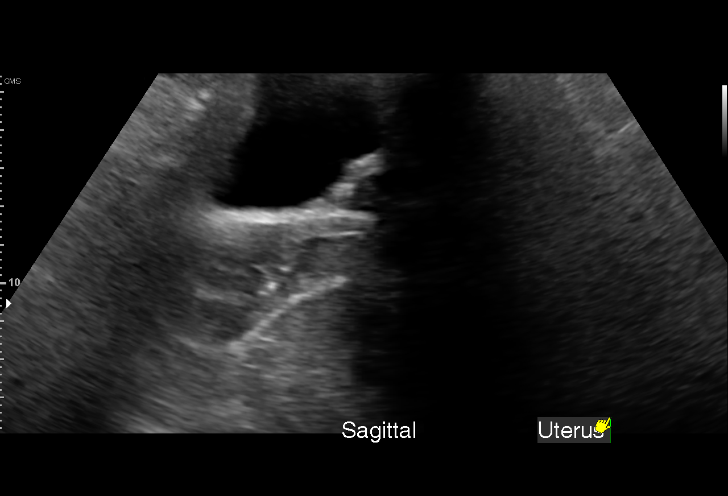
[im 13/20]
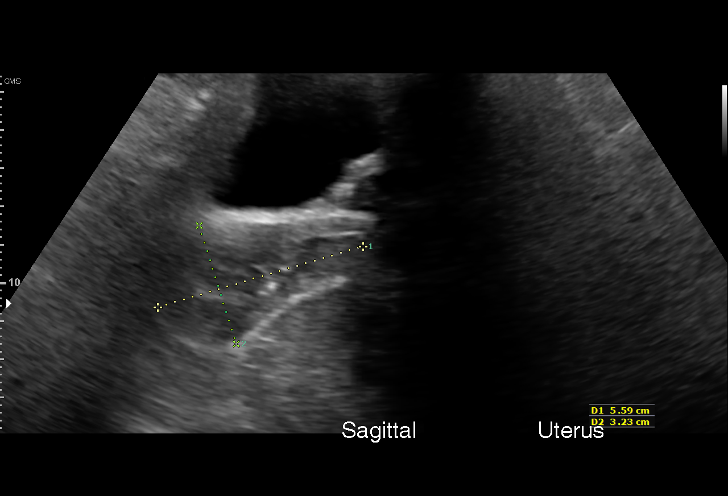
[im 15/20]
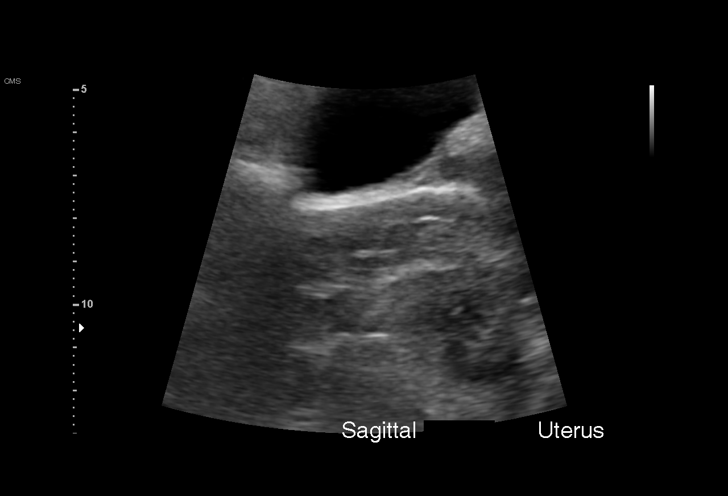
[im 16/20]
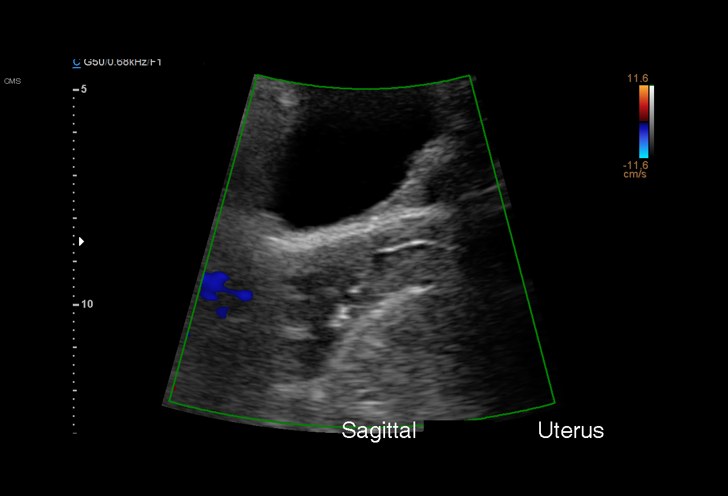
[im 17/20]
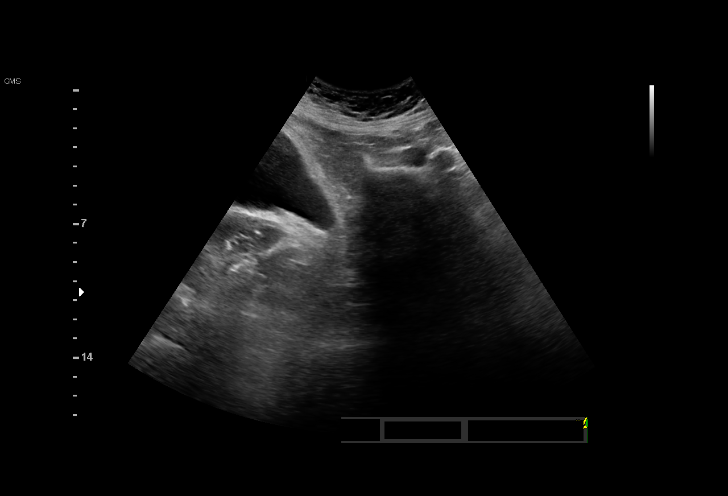
[im 19/20]
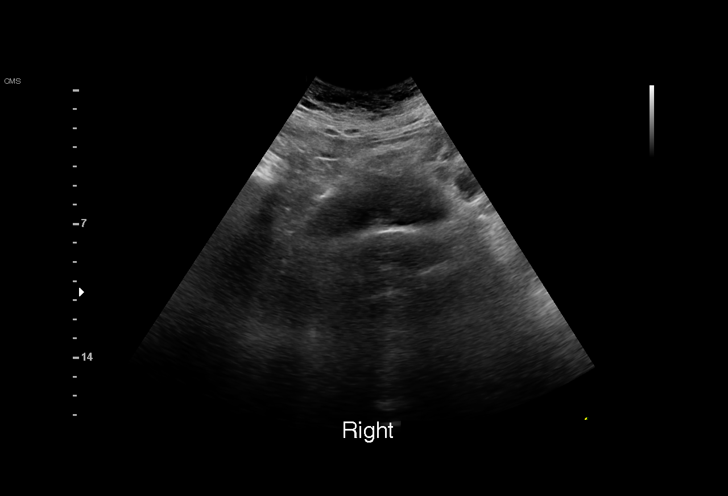
[im 20/20]
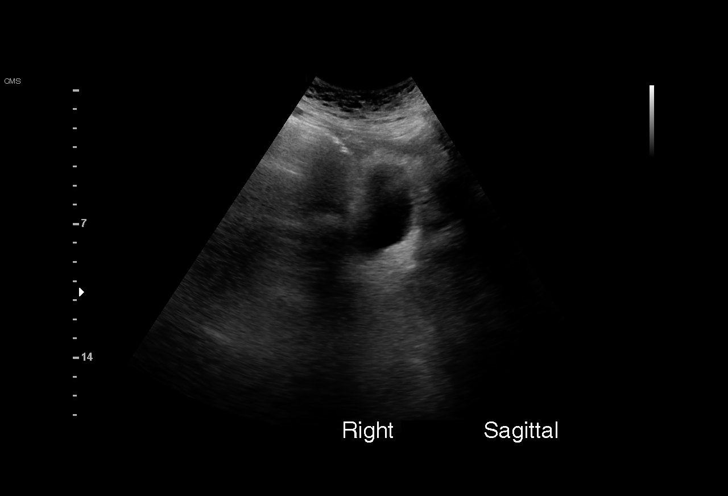

[15 of 20 positions shown; findings below may reference images not displayed]

FINDINGS: Uterus

Measurements: 5.6 x 3.2 x 3.3 cm. No fibroids or other mass
visualized.

Endometrium

Thickness: Thickened, heterogeneous, 12 mm..

Right ovary

Measurements: Not visualized.  No adnexal mass seen.

Left ovary

Measurements: Not visualized.  No adnexal mass seen.

Other findings:  No abnormal free fluid.
IMPRESSION: Endometrium thickened and heterogeneous, 12 mm. In the setting of
post-menopausal bleeding, endometrial sampling is indicated to
exclude carcinoma. If results are benign, sonohysterogram should be
considered for focal lesion work-up. (Ref: Radiological Reasoning:
Algorithmic Workup of Abnormal Vaginal Bleeding with Endovaginal
Sonography and Sonohysterography. AJR [3X]; 191:S68-73)

## 2017-08-01 MED ORDER — MEGESTROL ACETATE 40 MG PO TABS
80.0000 mg | ORAL_TABLET | Freq: Every day | ORAL | Status: DC
  Administered 2017-08-01: 80 mg via ORAL
  Filled 2017-08-01 (×2): qty 2

## 2017-08-01 MED ORDER — MEGESTROL ACETATE 40 MG PO TABS: 80.0000 mg | ORAL_TABLET | Freq: Three times a day (TID) | ORAL | 0 refills | Status: DC

## 2017-08-01 MED ORDER — MORPHINE SULFATE (PF) 4 MG/ML IV SOLN
1.0000 mg | Freq: Once | INTRAVENOUS | Status: AC
  Administered 2017-08-01: 1 mg via INTRAMUSCULAR
  Filled 2017-08-01: qty 1

## 2017-08-01 NOTE — MAU Provider Note (Signed)
History     CSN: [id number]  Arrival date and time: 08/01/17 3846   First Provider Initiated Contact with Patient 08/01/17 [phone]      Chief Complaint  Patient presents with  . Vaginal Bleeding   84 y.o. postmenopausal female here with VB. She is unsure when the bleeding started but reports going to the BR many times a day to clean up. She presented to [facility] yesterday for hematuria and was found to have frank VB. Hgb was stable and consult with GYN recommend outpt follow up. She presented to MAU today because she was passing pieces of tissue this morning. She also reports epigastric pain that she thinks is worse after the medicine she was given yesterday (Pepcid and Ultram). Hx is significant for anticoagulation therapy with Coumadin. Interview is difficult d/t pt with wandering ideas. She declines having her son or daughter present in room.    Past Medical History:  Diagnosis Date  . Anxiety   . Arthritis    "left knee" (04/05/2017)  . Atrial fibrillation ([place])   . Atrial flutter ([place])    typical appearing  . Atrial tachycardia ([place])    ablated 11/17/10  by JA  from the [facility] of the aorta  . Chronic lower back pain   . Dizziness    chronic and of an unclear etiology  . Gastritis   . GERD (gastroesophageal reflux disease)   . History of blood transfusion ~ 1948  . HTN (hypertension)   . Hyperlipemia   . Hypothyroidism   . Obesity   . Osteoporosis   . Sinus headache   . Vitamin D deficiency     Past Surgical History:  Procedure Laterality Date  . ATRIAL ABLATION SURGERY  11/17/10   Atrial tachycardia arising from [facility] of the aorta ablated by JA  . BACK SURGERY    . CARDIAC CATHETERIZATION  01/11/2011   Archie Endo 01/12/2011  . CHOLECYSTECTOMY N/A 04/08/2017   Procedure: LAPAROSCOPIC CHOLECYSTECTOMY;  Surgeon: Georganna Skeans, MD;  Location: [place];  Service: General;  Laterality: N/A;  . FRACTURE SURGERY    . LUMBAR SPINE SURGERY  ~ 1998  . SHOULDER SURGERY Right 1984 X2   Rollene Rotunda  01/19/2011  . WRIST FRACTURE SURGERY Left 1983    Family History  Problem Relation Age of Onset  . Heart attack Father   . Hypertension Father     Social History   Tobacco Use  . Smoking status: Never Smoker  . Smokeless tobacco: Never Used  Substance Use Topics  . Alcohol use: No  . Drug use: No    Allergies:  Allergies  Allergen Reactions  . Iodinated Diagnostic Agents Shortness Of Breath    headache  . Penicillins Other (See Comments)    Tolerated Zosyn Oct 2018    Medications Prior to Admission  Medication Sig Dispense Refill Last Dose  . acetaminophen (TYLENOL) 325 MG tablet Take 325 mg by mouth every 4 (four) hours as needed for headache (pain). Reported on 10/04/2015   Past Week at Unknown time  . famotidine (PEPCID) 20 MG tablet Take 1 tablet (20 mg total) by mouth 2 (two) times daily. 30 tablet 0 07/31/2017 at Unknown time  . gentamicin cream (GARAMYCIN) 0.1 % Apply 1 application topically 3 (three) times daily. 30 g 1 Past Month at Unknown time  . levothyroxine (SYNTHROID, LEVOTHROID) 88 MCG tablet Take 1 tablet (88 mcg total) by mouth daily before breakfast. 30 tablet 0 Past Week at Unknown time  . metoprolol (  LOPRESSOR) 50 MG tablet Take 1 tablet (50 mg total) by mouth 2 (two) times daily. 60 tablet 0 Past Week at Unknown time  . clonazePAM (KLONOPIN) 0.5 MG tablet Take 0.5-1 tablets (0.25-0.5 mg total) by mouth 2 (two) times daily. (Patient not taking: Reported on 08/01/2017) 10 tablet 0 Not Taking at Unknown time  . Cyanocobalamin 1000 MCG/ML KIT Inject 1,000 mcg as directed every 30 (thirty) days.   07/25/2017  . pantoprazole (PROTONIX) 20 MG tablet Take 1 tablet (20 mg total) by mouth daily. (Patient not taking: Reported on 08/01/2017) 30 tablet 0 Not Taking at Unknown time  . traMADol (ULTRAM) 50 MG tablet Take 1 tablet (50 mg total) by mouth every 6 (six) hours as needed. (Patient not taking: Reported on 08/01/2017) 15 tablet 0 Not Taking at Unknown time  . warfarin  (COUMADIN) 5 MG tablet Take 1/2 to 1 tablet daily as directed by coumadin clinic (Patient taking differently: Take 2.5-5 mg by mouth See admin instructions. Take 1/2 tablet on Sunday then take 1 tablet all there other days) 30 tablet 3 06/09/2017 at 1700    Review of Systems  Gastrointestinal: Positive for abdominal pain and nausea.  Genitourinary: Positive for vaginal bleeding.   Physical Exam   Blood pressure (!) 162/72, pulse 89, temperature 98.2 F (36.8 C), temperature source Oral, resp. rate 20, height 5' 8"  (1.727 m), weight 203 lb 4 oz (92.2 kg), SpO2 100 %.  Physical Exam  Constitutional: She is oriented to person, place, and time. She appears well-developed and well-nourished. No distress.  HENT:  Head: Normocephalic and atraumatic.  Neck: Normal range of motion.  Respiratory: Effort normal. No respiratory distress.  Genitourinary:  Genitourinary Comments: 1 nml sized peripad saturated with bright red blood  Musculoskeletal: Normal range of motion.  Neurological: She is alert and oriented to person, place, and time.  Skin: Skin is warm and dry.  Psychiatric: She has a normal mood and affect. Her speech is normal and behavior is normal. Cognition and memory are impaired.   Results for orders placed or performed during the hospital encounter of 08/01/17 (from the past 24 hour(s))  CBC     Status: None   Collection Time: 08/01/17  9:25 AM  Result Value Ref Range   WBC 6.5 4.0 - 10.5 K/uL   RBC 3.96 3.87 - 5.11 MIL/uL   Hemoglobin 12.8 12.0 - 15.0 g/dL   HCT 36.6 36.0 - 46.0 %   MCV 92.4 78.0 - 100.0 fL   MCH 32.3 26.0 - 34.0 pg   MCHC 35.0 30.0 - 36.0 g/dL   RDW 14.6 11.5 - 15.5 %   Platelets 177 150 - 400 K/uL   US Pelvis (transabdominal Only)  Result Date: 08/01/2017 CLINICAL DATA:  Postmenopausal bleeding, on anticoagulants EXAM: TRANSABDOMINAL ULTRASOUND OF PELVIS TECHNIQUE: Transabdominal ultrasound examination of the pelvis was performed including evaluation of  the uterus, ovaries, adnexal regions, and pelvic cul-de-sac. COMPARISON:  CT 04/05/2017 FINDINGS: Uterus Measurements: 5.6 x 3.2 x 3.3 cm. No fibroids or other mass visualized. Endometrium Thickness: Thickened, heterogeneous, 12 mm. Right ovary Measurements: Not visualized.  No adnexal mass seen. Left ovary Measurements: Not visualized.  No adnexal mass seen. Other findings:  No abnormal free fluid. IMPRESSION: Endometrium thickened and heterogeneous, 12 mm. In the setting of post-menopausal bleeding, endometrial sampling is indicated to exclude carcinoma. If results are benign, sonohysterogram should be considered for focal lesion work-up. (Ref: Radiological Reasoning: Algorithmic Workup of Abnormal Vaginal Bleeding with Endovaginal Sonography and  Sonohysterography. AJR 2008; 161:W96-04) Electronically Signed   By: Rolm Baptise M.D.   On: 08/01/2017 11:48   MAU Course  Procedures Megace  MDM Consult with Dr. Harolyn Rutherford, notified of presentation, labs and US findings. MD to come evaluate and attempt EBX. MD at bedside, pt could not tolerate speculum exam. Pt will come back for EUA and D&C. Will start Megace. Stable for discharge home.  Assessment and Plan   1. Abnormal uterine bleeding (AUB)   2. Postmenopausal bleeding    Discharge home Follow up in 4 days at [facility] Megace Return for worsening sx  Allergies as of 08/01/2017      Reactions   Iodinated Diagnostic Agents Shortness Of Breath   headache   Penicillins Other (See Comments)   Tolerated Zosyn Oct 2018      Medication List    STOP taking these medications   clonazePAM 0.5 MG tablet Commonly known as:  KLONOPIN   pantoprazole 20 MG tablet Commonly known as:  PROTONIX   traMADol 50 MG tablet Commonly known as:  ULTRAM     TAKE these medications   acetaminophen 325 MG tablet Commonly known as:  TYLENOL Take 325 mg by mouth every 4 (four) hours as needed for headache (pain). Reported on 10/04/2015   Cyanocobalamin 1000  MCG/ML Kit Inject 1,000 mcg as directed every 30 (thirty) days.   famotidine 20 MG tablet Commonly known as:  PEPCID Take 1 tablet (20 mg total) by mouth 2 (two) times daily.   gentamicin cream 0.1 % Commonly known as:  GARAMYCIN Apply 1 application topically 3 (three) times daily.   levothyroxine 88 MCG tablet Commonly known as:  SYNTHROID, LEVOTHROID Take 1 tablet (88 mcg total) by mouth daily before breakfast.   megestrol 40 MG tablet Commonly known as:  MEGACE Take 2 tablets (80 mg total) by mouth 3 (three) times daily.   metoprolol tartrate 50 MG tablet Commonly known as:  LOPRESSOR Take 1 tablet (50 mg total) by mouth 2 (two) times daily.   warfarin 5 MG tablet Commonly known as:  COUMADIN Take as directed. If you are unsure how to take this medication, talk to your nurse or doctor. Original instructions:  Take 1/2 to 1 tablet daily as directed by coumadin clinic What changed:    how much to take  how to take this  when to take this  additional instructions      Julianne Handler, CNM 08/01/2017, 1:27 PM

## 2017-08-01 NOTE — MAU Note (Signed)
Seen in ED yesterday-stable, for op f/u.  Family not wanting to wait for office appt.

## 2017-08-01 NOTE — Discharge Instructions (Signed)

## 2017-08-01 NOTE — MAU Note (Deleted)
Pt. Back from radiology for u/s, to BR for void, specimen collected and sent to lab.

## 2017-08-04 ENCOUNTER — Encounter (HOSPITAL_COMMUNITY): Payer: Self-pay | Admitting: Anesthesiology

## 2017-08-04 DIAGNOSIS — I83025 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: Secondary | ICD-10-CM | POA: Insufficient documentation

## 2017-08-04 DIAGNOSIS — L97529 Non-pressure chronic ulcer of other part of left foot with unspecified severity: Secondary | ICD-10-CM | POA: Insufficient documentation

## 2017-08-04 NOTE — Anesthesia Preprocedure Evaluation (Addendum)
Anesthesia Evaluation  Patient identified by MRN, date of birth, ID band Patient awake    Reviewed: Allergy & Precautions, NPO status , Patient's Chart, lab work & pertinent test results, reviewed documented beta blocker date and time   Airway Mallampati: II  TM Distance: >3 FB Neck ROM: Full    Dental  (+) Edentulous Upper, Missing, Poor Dentition, Chipped,  Chipped:   Pulmonary neg pulmonary ROS,    Pulmonary exam normal breath sounds clear to auscultation       Cardiovascular hypertension, Pt. on medications and Pt. on home beta blockers + Peripheral Vascular Disease and +CHF  + dysrhythmias Atrial Fibrillation  Rhythm:Regular Rate:Normal     Neuro/Psych  Headaches, PSYCHIATRIC DISORDERS Anxiety    GI/Hepatic Neg liver ROS, GERD  Medicated and Controlled,  Endo/Other  Hypothyroidism   Renal/GU negative Renal ROS  negative genitourinary   Musculoskeletal  (+) Arthritis , Osteoarthritis,  Ulcer left middle toe   Abdominal Normal abdominal exam  (+) + obese,   Peds  Hematology Coumadin use- therpeutic, last dose 0400   Anesthesia Other Findings   Reproductive/Obstetrics Post Menopausal Bleeding Thickened endometrium                           Anesthesia Physical Anesthesia Plan  ASA: III  Anesthesia Plan: General   Post-op Pain Management:    Induction: Intravenous  PONV Risk Score and Plan: TIVA, Propofol infusion, Ondansetron and Treatment may vary due to age or medical condition  Airway Management Planned: LMA  Additional Equipment:   Intra-op Plan:   Post-operative Plan: Extubation in OR  Informed Consent: I have reviewed the patients History and Physical, chart, labs and discussed the procedure including the risks, benefits and alternatives for the proposed anesthesia with the patient or authorized representative who has indicated his/her understanding and acceptance.    Dental advisory given  Plan Discussed with: CRNA, Anesthesiologist and Surgeon  Anesthesia Plan Comments:        Anesthesia Quick Evaluation

## 2017-08-05 ENCOUNTER — Inpatient Hospital Stay (HOSPITAL_COMMUNITY): Payer: Medicare Other | Admitting: Anesthesiology

## 2017-08-05 ENCOUNTER — Ambulatory Visit (HOSPITAL_COMMUNITY)
Admission: RE | Admit: 2017-08-05 | Discharge: 2017-08-05 | Disposition: A | Discharge status: Home / Self Care | Payer: Medicare Other | Source: Ambulatory Visit | Attending: Obstetrics & Gynecology | Admitting: Obstetrics & Gynecology

## 2017-08-05 ENCOUNTER — Encounter (HOSPITAL_COMMUNITY)
Admission: RE | Disposition: A | Discharge status: Home / Self Care | Payer: Self-pay | Source: Ambulatory Visit | Attending: Obstetrics & Gynecology

## 2017-08-05 ENCOUNTER — Encounter (HOSPITAL_COMMUNITY): Payer: Self-pay

## 2017-08-05 DIAGNOSIS — I4821 Permanent atrial fibrillation: Secondary | ICD-10-CM

## 2017-08-05 DIAGNOSIS — I110 Hypertensive heart disease with heart failure: Secondary | ICD-10-CM | POA: Insufficient documentation

## 2017-08-05 DIAGNOSIS — Z7901 Long term (current) use of anticoagulants: Secondary | ICD-10-CM

## 2017-08-05 DIAGNOSIS — I83025 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: Secondary | ICD-10-CM

## 2017-08-05 DIAGNOSIS — E669 Obesity, unspecified: Secondary | ICD-10-CM | POA: Diagnosis not present

## 2017-08-05 DIAGNOSIS — C541 Malignant neoplasm of endometrium: Secondary | ICD-10-CM | POA: Insufficient documentation

## 2017-08-05 DIAGNOSIS — I5032 Chronic diastolic (congestive) heart failure: Secondary | ICD-10-CM | POA: Diagnosis not present

## 2017-08-05 DIAGNOSIS — E039 Hypothyroidism, unspecified: Secondary | ICD-10-CM | POA: Diagnosis not present

## 2017-08-05 DIAGNOSIS — K219 Gastro-esophageal reflux disease without esophagitis: Secondary | ICD-10-CM | POA: Insufficient documentation

## 2017-08-05 DIAGNOSIS — I739 Peripheral vascular disease, unspecified: Secondary | ICD-10-CM | POA: Insufficient documentation

## 2017-08-05 DIAGNOSIS — Z880 Allergy status to penicillin: Secondary | ICD-10-CM | POA: Insufficient documentation

## 2017-08-05 DIAGNOSIS — I4891 Unspecified atrial fibrillation: Secondary | ICD-10-CM | POA: Insufficient documentation

## 2017-08-05 DIAGNOSIS — I4892 Unspecified atrial flutter: Secondary | ICD-10-CM | POA: Insufficient documentation

## 2017-08-05 DIAGNOSIS — Z79899 Other long term (current) drug therapy: Secondary | ICD-10-CM | POA: Diagnosis not present

## 2017-08-05 DIAGNOSIS — N950 Postmenopausal bleeding: Secondary | ICD-10-CM | POA: Diagnosis present

## 2017-08-05 DIAGNOSIS — N841 Polyp of cervix uteri: Secondary | ICD-10-CM | POA: Insufficient documentation

## 2017-08-05 DIAGNOSIS — Z91041 Radiographic dye allergy status: Secondary | ICD-10-CM | POA: Insufficient documentation

## 2017-08-05 DIAGNOSIS — L97529 Non-pressure chronic ulcer of other part of left foot with unspecified severity: Secondary | ICD-10-CM

## 2017-08-05 HISTORY — PX: HYSTEROSCOPY W/D&C: SHX1775

## 2017-08-05 LAB — COMPREHENSIVE METABOLIC PANEL
ALT: 14 U/L (ref 14–54)
AST: 22 U/L (ref 15–41)
Albumin: 3.8 g/dL (ref 3.5–5.0)
Alkaline Phosphatase: 72 U/L (ref 38–126)
Anion gap: 8 (ref 5–15)
BILIRUBIN TOTAL: 1.1 mg/dL (ref 0.3–1.2)
BUN: 14 mg/dL (ref 6–20)
CO2: 20 mmol/L — ABNORMAL LOW (ref 22–32)
Calcium: 9.1 mg/dL (ref 8.9–10.3)
Chloride: 108 mmol/L (ref 101–111)
Creatinine, Ser: 0.88 mg/dL (ref 0.44–1.00)
GFR, EST NON AFRICAN AMERICAN: 60 mL/min — AB (ref >60)
Glucose, Bld: 104 mg/dL — ABNORMAL HIGH (ref 65–99)
POTASSIUM: 4 mmol/L (ref 3.5–5.1)
Sodium: 136 mmol/L (ref 135–145)
TOTAL PROTEIN: 6.9 g/dL (ref 6.5–8.1)

## 2017-08-05 LAB — CBC
HCT: 37.3 % (ref 36.0–46.0)
HEMOGLOBIN: 13.2 g/dL (ref 12.0–15.0)
MCH: 32.7 pg (ref 26.0–34.0)
MCHC: 35.4 g/dL (ref 30.0–36.0)
MCV: 92.3 fL (ref 78.0–100.0)
Platelets: 219 10*3/uL (ref 150–400)
RBC: 4.04 MIL/uL (ref 3.87–5.11)
RDW: 14.7 % (ref 11.5–15.5)
WBC: 7.1 10*3/uL (ref 4.0–10.5)

## 2017-08-05 LAB — TYPE AND SCREEN
ABO/RH(D): A POS
ANTIBODY SCREEN: NEGATIVE

## 2017-08-05 LAB — APTT: APTT: 32 s (ref 24–36)

## 2017-08-05 LAB — PROTIME-INR
INR: 2.57
PROTHROMBIN TIME: 27.4 s — AB (ref 11.4–15.2)

## 2017-08-05 LAB — ABO/RH: ABO/RH(D): A POS

## 2017-08-05 SURGERY — DILATATION AND CURETTAGE /HYSTEROSCOPY
Anesthesia: General

## 2017-08-05 SURGERY — Surgical Case
Anesthesia: *Unknown

## 2017-08-05 MED ORDER — PHENYLEPHRINE 40 MCG/ML (10ML) SYRINGE FOR IV PUSH (FOR BLOOD PRESSURE SUPPORT)
PREFILLED_SYRINGE | INTRAVENOUS | Status: AC
  Filled 2017-08-05: qty 10

## 2017-08-05 MED ORDER — FENTANYL CITRATE (PF) 100 MCG/2ML IJ SOLN
INTRAMUSCULAR | Status: AC
Start: 2017-08-05 — End: ?
  Filled 2017-08-05: qty 2

## 2017-08-05 MED ORDER — PROPOFOL 500 MG/50ML IV EMUL
INTRAVENOUS | Status: DC | PRN
  Administered 2017-08-05: 15 ug/kg/min via INTRAVENOUS

## 2017-08-05 MED ORDER — PHENYLEPHRINE HCL 10 MG/ML IJ SOLN
INTRAMUSCULAR | Status: DC | PRN
  Administered 2017-08-05 (×2): 20 ug via INTRAVENOUS
  Administered 2017-08-05: 40 ug via INTRAVENOUS
  Administered 2017-08-05 (×2): 20 ug via INTRAVENOUS

## 2017-08-05 MED ORDER — ONDANSETRON HCL 4 MG/2ML IJ SOLN
INTRAMUSCULAR | Status: AC
  Filled 2017-08-05: qty 2

## 2017-08-05 MED ORDER — FENTANYL CITRATE (PF) 100 MCG/2ML IJ SOLN
INTRAMUSCULAR | Status: DC | PRN
  Administered 2017-08-05: 25 ug via INTRAVENOUS
  Administered 2017-08-05: 50 ug via INTRAVENOUS
  Administered 2017-08-05: 25 ug via INTRAVENOUS

## 2017-08-05 MED ORDER — ONDANSETRON HCL 4 MG/2ML IJ SOLN
INTRAMUSCULAR | Status: DC | PRN
  Administered 2017-08-05: 4 mg via INTRAVENOUS

## 2017-08-05 MED ORDER — BUPIVACAINE HCL (PF) 0.5 % IJ SOLN
INTRAMUSCULAR | Status: AC
  Filled 2017-08-05: qty 30

## 2017-08-05 MED ORDER — LACTATED RINGERS IV SOLN
INTRAVENOUS | Status: DC
  Administered 2017-08-05: 09:00:00 via INTRAVENOUS

## 2017-08-05 MED ORDER — METOCLOPRAMIDE HCL 5 MG/ML IJ SOLN: 10.0000 mg | Freq: Once | INTRAMUSCULAR | Status: DC | PRN

## 2017-08-05 MED ORDER — DOCUSATE SODIUM 100 MG PO CAPS: 100.0000 mg | ORAL_CAPSULE | Freq: Two times a day (BID) | ORAL | 2 refills | Status: DC | PRN

## 2017-08-05 MED ORDER — PROPOFOL 10 MG/ML IV BOLUS
INTRAVENOUS | Status: AC
  Filled 2017-08-05: qty 20

## 2017-08-05 MED ORDER — PROPOFOL 10 MG/ML IV BOLUS
INTRAVENOUS | Status: DC | PRN
  Administered 2017-08-05 (×2): 30 mg via INTRAVENOUS
  Administered 2017-08-05: 80 mg via INTRAVENOUS
  Administered 2017-08-05: 20 mg via INTRAVENOUS

## 2017-08-05 MED ORDER — OXYCODONE-ACETAMINOPHEN 5-325 MG PO TABS: 1.0000 | ORAL_TABLET | ORAL | 0 refills | Status: DC | PRN

## 2017-08-05 MED ORDER — LIDOCAINE HCL (CARDIAC) 20 MG/ML IV SOLN
INTRAVENOUS | Status: DC | PRN
  Administered 2017-08-05: 30 mg via INTRAVENOUS

## 2017-08-05 MED ORDER — FENTANYL CITRATE (PF) 100 MCG/2ML IJ SOLN: 25.0000 ug | INTRAMUSCULAR | Status: DC | PRN

## 2017-08-05 MED ORDER — BACITRACIN ZINC 500 UNIT/GM EX OINT
TOPICAL_OINTMENT | CUTANEOUS | Status: AC
  Filled 2017-08-05: qty 28.35

## 2017-08-05 MED ORDER — PROPOFOL 500 MG/50ML IV EMUL
INTRAVENOUS | Status: AC
  Filled 2017-08-05: qty 50

## 2017-08-05 MED ORDER — LIDOCAINE HCL (CARDIAC) 20 MG/ML IV SOLN
INTRAVENOUS | Status: AC
  Filled 2017-08-05: qty 5

## 2017-08-05 MED ORDER — BUPIVACAINE HCL 0.5 % IJ SOLN
INTRAMUSCULAR | Status: DC | PRN
  Administered 2017-08-05: 20 mL

## 2017-08-05 SURGICAL SUPPLY — 13 items
BIPOLAR CUTTING LOOP 21FR (ELECTRODE)
CANISTER SUCT 3000ML PPV (MISCELLANEOUS) ×3 IMPLANT
CATH ROBINSON RED A/P 16FR (CATHETERS) ×3 IMPLANT
GLOVE BIOGEL PI IND STRL 7.0 (GLOVE) ×1 IMPLANT
GLOVE BIOGEL PI INDICATOR 7.0 (GLOVE) ×2
GLOVE ECLIPSE 7.0 STRL STRAW (GLOVE) ×3 IMPLANT
GOWN STRL REUS W/TWL LRG LVL3 (GOWN DISPOSABLE) ×6 IMPLANT
LOOP CUTTING BIPOLAR 21FR (ELECTRODE) IMPLANT
PACK VAGINAL MINOR WOMEN LF (CUSTOM PROCEDURE TRAY) ×3 IMPLANT
PAD OB MATERNITY 4.3X12.25 (PERSONAL CARE ITEMS) ×3 IMPLANT
TOWEL OR 17X24 6PK STRL BLUE (TOWEL DISPOSABLE) ×6 IMPLANT
TUBING AQUILEX INFLOW (TUBING) ×3 IMPLANT
TUBING AQUILEX OUTFLOW (TUBING) ×3 IMPLANT

## 2017-08-05 NOTE — Op Note (Signed)
PREOPERATIVE DIAGNOSIS: Postmenopausal bleeding in the setting chronic anticoagulation POSTOPERATIVE DIAGNOSIS: The same, Cervical Polyp PROCEDURE: Diagnostic Hysteroscopy, Cervical Polypectomy, Polypectomy, Dilation and Curettage. SURGEON:  Dr. Verita Schneiders  INDICATIONS: 84 y.o. [id number] here for scheduled surgery for the aforementioned diagnoses. Unable to do in office pelvic exam due to patient's discomfort.  Risks of surgery were discussed with the patient including but not limited to: bleeding which may require transfusion; infection which may require antibiotics; injury to uterus or surrounding organs; intrauterine scarring; need for additional procedures including laparotomy or laparoscopy; and other postoperative/anesthesia complications. Written informed consent was obtained.    FINDINGS:  7 mm cervical polyp noted at external os.  A 6 week size uterus.  Diffuse proliferative endometrium noted.  Normal ostia bilaterally.  ANESTHESIA:  General, paracervical block with 20 ml of 0.5% Marcaine FLUID DEFICITS: 0 ml of LR ESTIMATED BLOOD LOSS: 20 ml SPECIMENS: Cervical polyp, Endometrial curettings sent to pathology COMPLICATIONS:  None immediate.  PROCEDURE DETAILS:  The patient received intravenous antibiotics while in the preoperative area.  She was then taken to the operating room where general anesthesia was administered and was found to be adequate.  After an adequate timeout was performed, she was placed in the dorsal lithotomy position and examined; then prepped and draped in the sterile manner.   Her bladder was catheterized for an unmeasured amount of clear, yellow urine. A speculum was then placed in the patient's vagina and a single tooth tenaculum was applied to the anterior lip of the cervix.   A paracervical block using 20 ml of 0.5% Marcaine was administered.   Polypoid lesion noted in the ectocervix with narrow stalk. Ring forceps were used to grasp the polypoid lesion and the  polypoid lesions was removed by twisting it off its base.  Tissue sent to pathology. The cervix was then sounded to 6 cm and dilated manually with metal dilators to accommodate the 5 mm diagnostic hysteroscope.  Once the cervix was dilated, the hysteroscope was inserted under direct visualization using glycine as a suspension medium.  The uterine cavity was carefully examined with the findings as noted above.   After further careful visualization of the uterine cavity, the hysteroscope was removed under direct visualization.  A sharp curettage was then performed to obtain a moderate amount of endometrial curettings.  The tenaculum was removed from the anterior lip of the cervix and the vaginal speculum was removed after noting good hemostasis.  The patient tolerated the procedure well and was taken to the recovery area awake, extubated and in stable condition.  The patient will be discharged to home as per PACU criteria.  Routine postoperative instructions given.  She was prescribed Percocet and Colace.  She will follow up in the clinic in 2-3 weeks  for postoperative evaluation.   Verita Schneiders, MD, [place] for [employer], [place]

## 2017-08-05 NOTE — Anesthesia Postprocedure Evaluation (Signed)
Anesthesia Post Note  Patient: Charlotte Castro  Procedure(s) Performed: DILATATION AND CURETTAGE /HYSTEROSCOPY AND POLYPECTOMY (N/A )     Patient location during evaluation: PACU Anesthesia Type: General Level of consciousness: awake and alert and oriented Pain management: pain level controlled Vital Signs Assessment: post-procedure vital signs reviewed and stable Respiratory status: spontaneous breathing, nonlabored ventilation and respiratory function stable Cardiovascular status: blood pressure returned to baseline and stable Postop Assessment: no apparent nausea or vomiting Anesthetic complications: no    Last Vitals:  Vitals:   08/05/17 0759 08/05/17 1215  BP:  (!) 149/76  Pulse: 94 79  Resp: 20 14  Temp: 36.7 C 37.2 C  SpO2: 99% 100%    Last Pain:  Vitals:   08/05/17 0759  TempSrc: Oral  PainSc: 5    Pain Goal: Patients Stated Pain Goal: 3 (08/05/17 0759)               Decklyn Hyder A.

## 2017-08-05 NOTE — Discharge Instructions (Signed)
DISCHARGE INSTRUCTIONS: D&C / D&E °The following instructions have been prepared to help you care for yourself upon your return home. °  °Personal hygiene: °• Use sanitary pads for vaginal drainage, not tampons. °• Shower the day after your procedure. °• NO tub baths, pools or Jacuzzis for 2-3 weeks. °• Wipe front to back after using the bathroom. ° °Activity and limitations: °• Do NOT drive or operate any equipment for 24 hours. The effects of anesthesia are still present and drowsiness may result. °• Do NOT rest in bed all day. °• Walking is encouraged. °• Walk up and down stairs slowly. °• You may resume your normal activity in one to two days or as indicated by your physician. ° °Sexual activity: NO intercourse for at least 2 weeks after the procedure, or as indicated by your physician. ° °Diet: Eat a light meal as desired this evening. You may resume your usual diet tomorrow. ° °Return to work: You may resume your work activities in one to two days or as indicated by your doctor. ° °What to expect after your surgery: Expect to have vaginal bleeding/discharge for 2-3 days and spotting for up to 10 days. It is not unusual to have soreness for up to 1-2 weeks. You may have a slight burning sensation when you urinate for the first day. Mild cramps may continue for a couple of days. You may have a regular period in 2-6 weeks. ° °Call your doctor for any of the following: °• Excessive vaginal bleeding, saturating and changing one pad every hour. °• Inability to urinate 6 hours after discharge from hospital. °• Pain not relieved by pain medication. °• Fever of 100.4° F or greater. °• Unusual vaginal discharge or odor. ° ° Call for an appointment:  ° ° °Patient’s signature: ______________________ ° °Nurse’s signature ________________________ ° °Support person's signature_______________________ ° ° °Hysteroscopy, Care After °Refer to this sheet in the next few weeks. These instructions provide you with information on  caring for yourself after your procedure. Your health care provider may also give you more specific instructions. Your treatment has been planned according to current medical practices, but problems sometimes occur. Call your health care provider if you have any problems or questions after your procedure. °What can I expect after the procedure? °After your procedure, it is typical to have the following: °· You may have some cramping. This normally lasts for a couple days. °· You may have bleeding. This can vary from light spotting for a few days to menstrual-like bleeding for 3-7 days. ° °Follow these instructions at home: °· Rest for the first 1-2 days after the procedure. °· Only take over-the-counter or prescription medicines as directed by your health care provider. Do not take aspirin. It can increase the chances of bleeding. °· Take showers instead of baths for 2 weeks or as directed by your health care provider. °· Do not drive for 24 hours or as directed. °· Do not drink alcohol while taking pain medicine. °· Do not use tampons, douche, or have sexual intercourse for 2 weeks or until your health care provider says it is okay. °· Take your temperature twice a day for 4-5 days. Write it down each time. °· Follow your health care provider's advice about diet, exercise, and lifting. °· If you develop constipation, you may: °? Take a mild laxative if your health care provider approves. °? Add bran foods to your diet. °? Drink enough fluids to keep your urine clear or pale yellow. °·   Try to have someone with you or available to you for the first 24-48 hours, especially if you were given a general anesthetic. °· Follow up with your health care provider as directed. °Contact a health care provider if: °· You feel dizzy or lightheaded. °· You feel sick to your stomach (nauseous). °· You have abnormal vaginal discharge. °· You have a rash. °· You have pain that is not controlled with medicine. °Get help right away  if: °· You have bleeding that is heavier than a normal menstrual period. °· You have a fever. °· You have increasing cramps or pain, not controlled with medicine. °· You have new belly (abdominal) pain. °· You pass out. °· You have pain in the tops of your shoulders (shoulder strap areas). °· You have shortness of breath. °This information is not intended to replace advice given to you by your health care provider. Make sure you discuss any questions you have with your health care provider. °Document Released: 03/21/2013 Document Revised: 11/06/2015 Document Reviewed: 12/28/2012 °Elsevier Interactive Patient Education © 2017 [employer] ° °

## 2017-08-05 NOTE — Anesthesia Procedure Notes (Signed)
Procedure Name: LMA Insertion Date/Time: 08/05/2017 11:28 AM Performed by: Garner Nash, CRNA Pre-anesthesia Checklist: Patient identified, Emergency Drugs available, Suction available, Patient being monitored and Timeout performed Patient Re-evaluated:Patient Re-evaluated prior to induction Oxygen Delivery Method: Circle system utilized Preoxygenation: Pre-oxygenation with 100% oxygen Induction Type: IV induction LMA: LMA inserted LMA Size: 4.0 Number of attempts: 1 Placement Confirmation: positive ETCO2 and breath sounds checked- equal and bilateral Tube secured with: Tape Dental Injury: Teeth and Oropharynx as per pre-operative assessment

## 2017-08-05 NOTE — Transfer of Care (Signed)
Immediate Anesthesia Transfer of Care Note  Patient: Charlotte Castro  Procedure(s) Performed: DILATATION AND CURETTAGE /HYSTEROSCOPY (N/A )  Patient Location: PACU  Anesthesia Type:General  Level of Consciousness: awake  Airway & Oxygen Therapy: Patient Spontanous Breathing and Patient connected to nasal cannula oxygen  Post-op Assessment: Report given to RN and Post -op Vital signs reviewed and stable  Post vital signs: Reviewed  Last Vitals:  Vitals:   08/05/17 0759  Pulse: 94  Resp: 20  Temp: 36.7 C  SpO2: 99%    Last Pain:  Vitals:   08/05/17 0759  TempSrc: Oral  PainSc: 5       Patients Stated Pain Goal: 3 (05/04/61 4469)  Complications: No apparent anesthesia complications

## 2017-08-05 NOTE — H&P (Signed)
Preoperative History and Physical  Charlotte Castro is a 84 y.o. [id number] here for surgical evaluation of postmenopausal bleeding with thickened endometrial stripe in the setting of anticogulation.   No significant preoperative concerns.  Proposed surgery: Diagnostic hysteroscopy, Dilation and Curettage  Past Medical History:  Diagnosis Date  . Anxiety   . Arthritis    "left knee" (04/05/2017)  . Atrial fibrillation ([place])   . Atrial flutter ([place])    typical appearing  . Atrial tachycardia ([place])    ablated 11/17/10  by JA  from the [facility] of the aorta  . Chronic lower back pain   . Dizziness    chronic and of an unclear etiology  . Gastritis   . GERD (gastroesophageal reflux disease)   . History of blood transfusion ~ 1948  . HTN (hypertension)   . Hyperlipemia   . Hypothyroidism   . Obesity   . Osteoporosis   . Sinus headache   . Vitamin D deficiency    Past Surgical History:  Procedure Laterality Date  . ATRIAL ABLATION SURGERY  11/17/10   Atrial tachycardia arising from [facility] of the aorta ablated by JA  . BACK SURGERY    . CARDIAC CATHETERIZATION  01/11/2011   Archie Endo 01/12/2011  . CHOLECYSTECTOMY N/A 04/08/2017   Procedure: LAPAROSCOPIC CHOLECYSTECTOMY;  Surgeon: Georganna Skeans, MD;  Location: [facility];  Service: General;  Laterality: N/A;  . FRACTURE SURGERY    . LUMBAR SPINE SURGERY  ~ 1998  . SHOULDER SURGERY Right 1984 X2   Rollene Rotunda 01/19/2011  . WRIST FRACTURE SURGERY Left 1983   OB History  Gravida Para Term Preterm AB Living  3       1    SAB TAB Ectopic Multiple Live Births  1            # Outcome Date GA Lbr Len/2nd Weight Sex Delivery Anes PTL Lv  3 Gravida           2 Gravida           1 SAB             Patient denies any other pertinent gynecologic issues.   No current facility-administered medications on file prior to encounter.    Current Outpatient Medications on File Prior to Encounter  Medication Sig Dispense Refill  . acetaminophen (TYLENOL) 325 MG  tablet Take 325 mg by mouth every 4 (four) hours as needed for headache (pain). Reported on 10/04/2015    . famotidine (PEPCID) 20 MG tablet Take 1 tablet (20 mg total) by mouth 2 (two) times daily. 30 tablet 0  . levothyroxine (SYNTHROID, LEVOTHROID) 88 MCG tablet Take 1 tablet (88 mcg total) by mouth daily before breakfast. 30 tablet 0  . metoprolol (LOPRESSOR) 50 MG tablet Take 1 tablet (50 mg total) by mouth 2 (two) times daily. 60 tablet 0  . warfarin (COUMADIN) 5 MG tablet Take 1/2 to 1 tablet daily as directed by coumadin clinic (Patient taking differently: Take 2.5-5 mg by mouth See admin instructions. Take 1/2 tablet on Sunday then take 1 tablet all there other days) 30 tablet 3  . Cyanocobalamin 1000 MCG/ML KIT Inject 1,000 mcg as directed every 30 (thirty) days.    Marland Kitchen gentamicin cream (GARAMYCIN) 0.1 % Apply 1 application topically 3 (three) times daily. 30 g 1  . megestrol (MEGACE) 40 MG tablet Take 2 tablets (80 mg total) by mouth 3 (three) times daily. 60 tablet 0   Allergies  Allergen Reactions  .  Iodinated Diagnostic Agents Shortness Of Breath    headache  . Penicillins Other (See Comments)    Tolerated Zosyn Oct 2018    Social History:   reports that  has never smoked. she has never used smokeless tobacco. She reports that she does not drink alcohol or use drugs.  Family History  Problem Relation Age of Onset  . Heart attack Father   . Hypertension Father     Review of Systems: Pertinent items noted in HPI and remainder of comprehensive ROS otherwise negative.  PHYSICAL EXAM: Pulse 94, temperature 98.1 F (36.7 C), temperature source Oral, resp. rate 20, SpO2 99 %. CONSTITUTIONAL: Well-developed, well-nourished female in no acute distress.  HENT:  Normocephalic, atraumatic, External right and left ear normal. Oropharynx is clear and moist EYES: Conjunctivae and EOM are normal. Pupils are equal, round, and reactive to light. No scleral icterus.  NECK: Normal range of  motion, supple, no masses SKIN: Skin is warm and dry. No rash noted. Not diaphoretic. No erythema. No pallor. NEUROLOGIC: Alert and oriented to person, place, and time. Normal reflexes, muscle tone coordination. No cranial nerve deficit noted. PSYCHIATRIC: Normal mood and affect. Normal behavior. Normal judgment and thought content. CARDIOVASCULAR: Normal heart rate noted, regular rhythm RESPIRATORY: Effort and breath sounds normal, no problems with respiration noted ABDOMEN: Soft, nontender, nondistended. PELVIC: Deferred MUSCULOSKELETAL: Normal range of motion. No edema and no tenderness. 2+ distal pulses.  Labs: Results for orders placed or performed during the hospital encounter of 08/01/17 (from the past 336 hour(s))  CBC   Collection Time: 08/01/17  9:25 AM  Result Value Ref Range   WBC 6.5 4.0 - 10.5 K/uL   RBC 3.96 3.87 - 5.11 MIL/uL   Hemoglobin 12.8 12.0 - 15.0 g/dL   HCT 36.6 36.0 - 46.0 %   MCV 92.4 78.0 - 100.0 fL   MCH 32.3 26.0 - 34.0 pg   MCHC 35.0 30.0 - 36.0 g/dL   RDW 14.6 11.5 - 15.5 %   Platelets 177 150 - 400 K/uL  Results for orders placed or performed during the hospital encounter of 07/31/17 (from the past 336 hour(s))  Lipase, blood   Collection Time: 07/31/17  8:10 AM  Result Value Ref Range   Lipase 31 11 - 51 U/L  Comprehensive metabolic panel   Collection Time: 07/31/17  8:10 AM  Result Value Ref Range   Sodium 140 135 - 145 mmol/L   Potassium 4.2 3.5 - 5.1 mmol/L   Chloride 107 101 - 111 mmol/L   CO2 20 (L) 22 - 32 mmol/L   Glucose, Bld 127 (H) 65 - 99 mg/dL   BUN 12 6 - 20 mg/dL   Creatinine, Ser 1.03 (H) 0.44 - 1.00 mg/dL   Calcium 9.6 8.9 - 10.3 mg/dL   Total Protein 6.8 6.5 - 8.1 g/dL   Albumin 3.6 3.5 - 5.0 g/dL   AST 28 15 - 41 U/L   ALT 12 (L) 14 - 54 U/L   Alkaline Phosphatase 74 38 - 126 U/L   Total Bilirubin 1.7 (H) 0.3 - 1.2 mg/dL   GFR calc non Af Amer 49 (L) >60 mL/min   GFR calc Af Amer 57 (L) >60 mL/min   Anion gap 13 5 -  15  CBC   Collection Time: 07/31/17  8:10 AM  Result Value Ref Range   WBC 8.1 4.0 - 10.5 K/uL   RBC 4.28 3.87 - 5.11 MIL/uL   Hemoglobin 14.2 12.0 - 15.0 g/dL  HCT 40.0 36.0 - 46.0 %   MCV 93.5 78.0 - 100.0 fL   MCH 33.2 26.0 - 34.0 pg   MCHC 35.5 30.0 - 36.0 g/dL   RDW 14.1 11.5 - 15.5 %   Platelets 181 150 - 400 K/uL  Protime-INR   Collection Time: 07/31/17 12:07 PM  Result Value Ref Range   Prothrombin Time 27.6 (H) 11.4 - 15.2 seconds   INR 2.59   Urinalysis, Routine w reflex microscopic   Collection Time: 07/31/17  1:31 PM  Result Value Ref Range   Color, Urine AMBER (A) YELLOW   APPearance HAZY (A) CLEAR   Specific Gravity, Urine 1.011 1.005 - 1.030   pH 7.0 5.0 - 8.0   Glucose, UA NEGATIVE NEGATIVE mg/dL   Hgb urine dipstick MODERATE (A) NEGATIVE   Bilirubin Urine NEGATIVE NEGATIVE   Ketones, ur NEGATIVE NEGATIVE mg/dL   Protein, ur NEGATIVE NEGATIVE mg/dL   Nitrite NEGATIVE NEGATIVE   Leukocytes, UA SMALL (A) NEGATIVE   RBC / HPF 6-30 0 - 5 RBC/hpf   WBC, UA 6-30 0 - 5 WBC/hpf   Bacteria, UA RARE (A) NONE SEEN   Squamous Epithelial / LPF 0-5 (A) NONE SEEN   Mucus PRESENT    Hyaline Casts, UA PRESENT     Imaging Studies: US Pelvis (transabdominal Only)  Result Date: 08/01/2017 CLINICAL DATA:  Postmenopausal bleeding, on anticoagulants EXAM: TRANSABDOMINAL ULTRASOUND OF PELVIS TECHNIQUE: Transabdominal ultrasound examination of the pelvis was performed including evaluation of the uterus, ovaries, adnexal regions, and pelvic cul-de-sac. COMPARISON:  CT 04/05/2017 FINDINGS: Uterus Measurements: 5.6 x 3.2 x 3.3 cm. No fibroids or other mass visualized. Endometrium Thickness: Thickened, heterogeneous, 12 mm. Right ovary Measurements: Not visualized.  No adnexal mass seen. Left ovary Measurements: Not visualized.  No adnexal mass seen. Other findings:  No abnormal free fluid. IMPRESSION: Endometrium thickened and heterogeneous, 12 mm. In the setting of post-menopausal  bleeding, endometrial sampling is indicated to exclude carcinoma. If results are benign, sonohysterogram should be considered for focal lesion work-up. (Ref: Radiological Reasoning: Algorithmic Workup of Abnormal Vaginal Bleeding with Endovaginal Sonography and Sonohysterography. AJR 2008; 076:A26-33) Electronically Signed   By: Rolm Baptise M.D.   On: 08/01/2017 11:48    Assessment: Principal Problem:   Postmenopausal bleeding Active Problems:   Long term current use of anticoagulant therapy   Chronic diastolic CHF (congestive heart failure) (HCC)   Endometrial thickening on ultrasound    Plan: Patient will undergo surgical management with Diagnostic hysteroscopy, Dilation and Curettage.   The risks of surgery were discussed in detail with the patient including but not limited to: bleeding which may require transfusion or reoperation; infection which may require antibiotics; injury to surrounding organs which may involve uterus, bowel, bladder; need for additional procedures including laparoscopy or laparotomy; thromboembolic phenomenon, surgical site problems and other postoperative/anesthesia complications. Likelihood of success in alleviating the patient's condition was discussed. Routine postoperative instructions will be reviewed with the patient and her family in detail after surgery.  The patient concurred with the proposed plan, giving informed written consent for the surgery.  Patient has been NPO since last night and she will remain NPO for procedure.  Anesthesia and OR aware.  Preoperative SCDs ordered on call to the OR.  To OR when ready.    Verita Schneiders, MD, [place] for [employer], [place]

## 2017-08-06 ENCOUNTER — Encounter (HOSPITAL_COMMUNITY): Payer: Self-pay | Admitting: Obstetrics & Gynecology

## 2017-08-08 ENCOUNTER — Ambulatory Visit (INDEPENDENT_AMBULATORY_CARE_PROVIDER_SITE_OTHER): Payer: Medicare Other | Admitting: Podiatry

## 2017-08-08 DIAGNOSIS — L97522 Non-pressure chronic ulcer of other part of left foot with fat layer exposed: Secondary | ICD-10-CM | POA: Diagnosis not present

## 2017-08-08 DIAGNOSIS — I83025 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: Secondary | ICD-10-CM

## 2017-08-08 DIAGNOSIS — L97529 Non-pressure chronic ulcer of other part of left foot with unspecified severity: Secondary | ICD-10-CM

## 2017-08-08 DIAGNOSIS — M2042 Other hammer toe(s) (acquired), left foot: Secondary | ICD-10-CM

## 2017-08-08 NOTE — Addendum Note (Signed)
Addendum  created 08/08/17 1216 by Lucretia Kern D, CRNA   Charge Capture section accepted, Visit diagnoses modified

## 2017-08-09 ENCOUNTER — Telehealth: Payer: Self-pay | Admitting: *Deleted

## 2017-08-09 NOTE — Telephone Encounter (Signed)
Spoke with Dr. Harolyn Rutherford regarding the patient. Patient to be scheduled to see Dr. Denman George on March 8th 9:30am. Patient not aware of the appt, appt to be scheduled after she see the doctor on March 7th

## 2017-08-09 NOTE — Progress Notes (Signed)
   Subjective:  Patient presents today for follow up evaluation of an ulceration to the left 3rd toe. She states she was using the gentamicin cream but it provided no significant relief. She reports being in the hospital for two days secondary to vaginal bleeding. Patient is here for further evaluation and treatment.   Past Medical History:  Diagnosis Date  . Anxiety   . Arthritis    "left knee" (04/05/2017)  . Atrial fibrillation ([place])   . Atrial flutter ([place])    typical appearing  . Atrial tachycardia ([place])    ablated 11/17/10  by JA  from the [facility] of the aorta  . Chronic lower back pain   . Dizziness    chronic and of an unclear etiology  . Gastritis   . GERD (gastroesophageal reflux disease)   . History of blood transfusion ~ 1948  . HTN (hypertension)   . Hyperlipemia   . Hypothyroidism   . Obesity   . Osteoporosis   . Sinus headache   . Vitamin D deficiency      Objective/Physical Exam General: The patient is alert and oriented x3 in no acute distress.  Dermatology:  Wound #1 noted to the 3rd toe of the left foot measuring approximately 1.0 x 1.0 x 0.2 cm (LxWxD).   To the noted ulceration(s), there is no eschar. There is a moderate amount of slough, fibrin, and necrotic tissue noted. Granulation tissue and wound base is red. There is a minimal amount of serosanguineous drainage noted. There is no exposed bone muscle-tendon ligament or joint. There is no malodor. Periwound integrity is intact. Skin is warm, dry and supple bilateral lower extremities.  Vascular: Palpable pedal pulses bilaterally. Mild edema noted. Capillary refill within normal limits.    Neurological: Epicritic and protective threshold absent bilaterally.   Musculoskeletal Exam: Range of motion within normal limits to all pedal and ankle joints bilateral. Muscle strength 5/5 in all groups bilateral.   Assessment: #1 Ulceration of the left third toe secondary to venous insufficiency   Plan of Care:    #1 Patient was evaluated. #2 medically necessary excisional debridement including subcutaneous tissue was performed using a tissue nipper and a chisel blade. Excisional debridement of all the necrotic nonviable tissue down to healthy bleeding viable tissue was performed with post-debridement measurements same as pre-. #3 the wound was cleansed with normal saline. #4 Toe crest pads dispensed.  #5 Recommended good fitting shoes.  #6 Continue using gentamicin cream daily with a Band-Aid.  #7 Return to clinic in 4 weeks.    Edrick Kins, DPM [facility]  Dr. Edrick Kins, [place]                                        [place], [place] [address]                Office [phone]  Fax [phone]

## 2017-08-13 ENCOUNTER — Other Ambulatory Visit: Payer: Self-pay | Admitting: Interventional Cardiology

## 2017-08-13 DIAGNOSIS — I4821 Permanent atrial fibrillation: Secondary | ICD-10-CM

## 2017-08-15 ENCOUNTER — Telehealth: Payer: Self-pay | Admitting: Obstetrics & Gynecology

## 2017-08-15 ENCOUNTER — Telehealth: Payer: Self-pay | Admitting: *Deleted

## 2017-08-15 NOTE — Telephone Encounter (Signed)
   [facility] Pre-operative Risk Assessment    Request for surgical clearance:  1. What type of surgery is being performed? Lower tooth extraction.  2. When is this surgery scheduled? TBD   3. What type of clearance is required (medical clearance vs. Pharmacy clearance to hold med vs. Both)? Medical and pharmacy.  4. Are there any medications that need to be held prior to surgery and how long? Coumadin  5. Practice name and name of physician performing surgery? [facility], Dr. Perlie Gold, DDS   6. What is your office phone and fax number?   P: [phone]  F; [phone]  7. Anesthesia type (None, local, MAC, general) ? Local    Reka Wist D 08/15/2017, 2:20 PM  _________________________________________________________________   (provider comments below)

## 2017-08-15 NOTE — Telephone Encounter (Signed)
Patient is requesting a call from Dr. Arther Abbott nurse. She has questions about why she is coming.

## 2017-08-16 ENCOUNTER — Encounter: Payer: Self-pay | Admitting: *Deleted

## 2017-08-18 ENCOUNTER — Ambulatory Visit (INDEPENDENT_AMBULATORY_CARE_PROVIDER_SITE_OTHER): Payer: Medicare Other | Admitting: Obstetrics & Gynecology

## 2017-08-18 ENCOUNTER — Encounter: Payer: Self-pay | Admitting: Obstetrics & Gynecology

## 2017-08-18 DIAGNOSIS — C541 Malignant neoplasm of endometrium: Secondary | ICD-10-CM

## 2017-08-18 NOTE — Progress Notes (Signed)
   GYNECOLOGY OFFICE VISIT NOTE  84 y.o. G3P0010 here today for discussion of recent pathology results after Hysteroscopy, D&C was done for postmenopausal bleeding on 08/05/2017. She is accompanied by her son. Minimal bleeding since procedure, no other complaints.  08/05/2017 Pathology Diagnosis 1. Cervix, polyp - ENDOCERVICAL TYPE POLYP. - THERE IS NO EVIDENCE OF MALIGNANCY. 2. 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   Previous imaging results: 08/01/2017 TRANSABDOMINAL ULTRASOUND OF PELVIS  CLINICAL DATA:  Postmenopausal bleeding, on anticoagulants  TECHNIQUE: Transabdominal ultrasound examination of the pelvis was performed including evaluation of the uterus, ovaries, adnexal regions, and pelvic cul-de-sac. COMPARISON:  CT 04/05/2017 FINDINGS: Uterus Measurements: 5.6 x 3.2 x 3.3 cm. No fibroids or other mass visualized. Endometrium Thickness: Thickened, heterogeneous, 12 mm. Right ovary Measurements: Not visualized.  No adnexal mass seen. Left ovary Measurements: Not visualized.  No adnexal mass seen. Other findings:  No abnormal free fluid. IMPRESSION: Endometrium thickened and heterogeneous, 12 mm. In the setting of post-menopausal bleeding, endometrial sampling is indicated to exclude carcinoma. If results are benign, sonohysterogram should be considered for focal lesion work-up.   Pathology results discussed with patient and her son. Appropriate support given to them, all questions answered in detail. Informed them of appointment with Dr. Everitt Amber ([place]), tomorrow 08/19/17 at 9:30am (patient needs to be there at 9:15am).  They were told that Dr. Denman George will explain further management in detail; many of their questions were appropriately focused on what the next steps will be given the diagnosis.   Total face-to-face time with patient: 15 minutes.  Over 50% of encounter was spent on counseling and coordination of care.   Verita Schneiders,  MD, [place] for [employer], [place]

## 2017-08-18 NOTE — Patient Instructions (Addendum)
You have an appointment with Dr. Everitt Amber at 9 am 08/19/2017 at the [facility]    Uterine Cancer Uterine cancer is an abnormal growth of cancer tissue (malignant tumor) in the uterus. Unlike noncancerous (benign) tumors, malignant tumors can spread to other parts of the body. Uterine cancer usually occurs after menopause. However, it may also occur around the time that menopause begins. The wall of the uterus has an inner layer of tissue (endometrium) and an outer layer of muscle tissue (myometrium). The most common type of uterine cancer begins in the endometrium (endometrial cancer). Cancer that begins in the myometrium (uterine sarcoma) is very rare. What are the causes? The exact cause of this condition is not known. What increases the risk? You are more likely to develop this condition if you:  Are older than 50.  Have an enlarged endometrium (endometrial hyperplasia).  Use hormone therapy.  Are severely overweight (obese).  Use the medicine tamoxifen.  You are white (Caucasian).  Cannot bear children (are infertile).  Have never been pregnant.  Started menstruating at an age younger than 12 years.  Are older than 52 and are still having menstrual periods.  Have a history of cancer of the ovaries, intestines, or colon or rectum (colorectal cancer).  Have a history of enlarged ovaries with small cysts (polycystic ovarian syndrome).  Have a family history of: ? Uterine cancer. ? Hereditary nonpolyposis colon cancer (HNPCC).  Have diabetes, high blood pressure, thyroid disease, or gallbladder disease.  Use long-term, high-dose birth control pills.  Have been exposed to radiation.  Smoke.  What are the signs or symptoms? Symptoms of this condition include:  Abnormal vaginal bleeding or discharge. Bleeding may start as a watery, blood-streaked flow that gradually contains more blood. This is the most common symptom. If you experience  abnormal vaginal bleeding, do not assume that it is part of menopause.  Vaginal bleeding after menopause.  Unexplained weight loss.  Bleeding between periods.  Urination that is difficult, painful, or more frequent than usual.  A lump (mass) in the vagina.  Pain, bloating, or fullness in the abdomen.  Pain in the pelvic area.  Pain during sex.  How is this diagnosed? This condition may be diagnosed based on:  Your medical history and your symptoms.  A physical and pelvic exam. Your health care provider will feel your pelvis for any growths or enlarged lymph nodes.  Blood and urine tests.  Imaging tests, such as X-rays, CT scans, ultrasound, or MRIs.  A procedure in which a thin, flexible tube with a light and camera on the end is inserted through the vagina and used to look inside the uterus (hysteroscopy).  A Pap test to check for abnormal cells in the lower part of the uterus (cervix) and the upper vagina.  Removing a tissue sample (biopsy) from the uterine lining to check for cancer cells.  Dilation and curettage (D&C). This is a procedure that involves stretching (dilation) the cervix and scraping (curettage) the inside lining of the uterus to get a biopsy and check for cancer cells.  Your cancer will be staged to determine its severity and extent. Staging is an assessment of:  The size of the tumor.  Whether the cancer has spread.  Where the cancer has spread.  The stages of uterine cancer are as follows:  Stage I. The cancer is only found in the uterus.  Stage II. The cancer has spread to the cervix.  Stage III.  The cancer has spread outside the uterus, but not outside the pelvis. The cancer may have spread to the lymph nodes in the pelvis. Lymph nodes are part of your body's disease-fighting (immune) system. Lymph nodes are found in many locations in your body, including the neck, underarm, and groin.  Stage IV. The cancer has spread to other parts of the  body, such as the bladder or rectum.  How is this treated? This condition is often treated with surgery to remove:  The uterus, cervix, fallopian tubes, and ovaries (total hysterectomy).  The uterus and cervix (simple hysterectomy).  The type of hysterectomy you will have depends on the extent of your cancer. Lymph nodes near the uterus may also be removed in some cases. Treatment may also include one or more of the following:  Chemotherapy. This uses medicines to kill the cancer cells and prevent their spread.  Radiation therapy. This uses high-energy rays to kill the cancer cells and prevent the spread of cancer.  Chemoradiation. This is a combination treatment that alternates chemotherapy with radiation treatments to enhance the way radiation works.  Brachytherapy. This involves placing radioactive materials inside the body where the cancer was removed.  Hormone therapy. This includes taking medicines that lower the levels of estrogen in the body.  Follow these instructions at home: Activity  Return to your normal activities as told by your health care provider. Ask your health care provider what activities are safe for you.  Exercise regularly as told by your health care provider.  Do not drive or use heavy machinery while taking prescription pain medicine. General instructions  Take over-the-counter and prescription medicines only as told by your health care provider.  Maintain a healthy diet.  Work with your health care provider to: ? Manage any long-term (chronic) conditions you have, such as diabetes, high blood pressure, thyroid disease, or gallbladder disease. ? Manage any side effects of your treatment.  Do not use any products that contain nicotine or tobacco, such as cigarettes and e-cigarettes. If you need help quitting, ask your health care provider.  Consider joining a support group to help you cope with stress. Your health care provider may be able to  recommend a local or online support group.  Keep all follow-up visits as told by your health care provider. This is important. Where to find more information:  American Cancer Society: https://www.cancer.[place] https://www.cancer.gov Contact a health care provider if:  You have pain in your pelvis or abdomen that gets worse.  You cannot urinate.  You have abnormal bleeding.  You have a fever. Get help right away if:  You develop sudden or new severe symptoms, such as: ? Heavy bleeding. ? Severe weakness. ? Pain that is severe or does not get better with medicine. Summary  Uterine cancer is an abnormal growth of cancer tissue (malignant tumor) in the uterus. The most common type of uterine cancer begins in the endometrium (endometrial cancer).  This condition is often treated with surgery to remove the uterus, cervix, fallopian tubes, and ovaries (total hysterectomy) or the uterus and cervix (simple hysterectomy).  Work with your health care provider to manage any long-term (chronic) conditions you have, such as diabetes, high blood pressure, thyroid disease, or gallbladder disease.  Consider joining a support group to help you cope with stress. Your health care provider may be able to recommend a local or online support group. This information is not intended to replace advice given to you by  your health care provider. Make sure you discuss any questions you have with your health care provider. Document Released: 05/31/2005 Document Revised: 05/28/2016 Document Reviewed: 05/28/2016 Elsevier Interactive Patient Education  [employer].

## 2017-08-19 ENCOUNTER — Encounter: Payer: Self-pay | Admitting: Gynecologic Oncology

## 2017-08-19 ENCOUNTER — Inpatient Hospital Stay: Payer: Medicare Other | Attending: Gynecologic Oncology | Admitting: Gynecologic Oncology

## 2017-08-19 ENCOUNTER — Inpatient Hospital Stay: Payer: Medicare Other

## 2017-08-19 ENCOUNTER — Ambulatory Visit: Payer: Medicare Other | Admitting: Gynecologic Oncology

## 2017-08-19 VITALS — BP 162/95 | HR 93 | Temp 97.8°F | Resp 18 | Ht 68.0 in | Wt 198.5 lb

## 2017-08-19 DIAGNOSIS — I4821 Permanent atrial fibrillation: Secondary | ICD-10-CM

## 2017-08-19 DIAGNOSIS — Z7901 Long term (current) use of anticoagulants: Secondary | ICD-10-CM

## 2017-08-19 DIAGNOSIS — C541 Malignant neoplasm of endometrium: Secondary | ICD-10-CM | POA: Diagnosis present

## 2017-08-19 DIAGNOSIS — I482 Chronic atrial fibrillation: Secondary | ICD-10-CM | POA: Insufficient documentation

## 2017-08-19 LAB — PROTIME-INR
INR: 2.4
PROTHROMBIN TIME: 25.9 s — AB (ref 11.4–15.2)

## 2017-08-19 MED ORDER — MEGESTROL ACETATE 40 MG PO TABS: 80.0000 mg | ORAL_TABLET | Freq: Two times a day (BID) | ORAL | 0 refills | Status: DC

## 2017-08-19 NOTE — Patient Instructions (Signed)
Preparing for your Surgery  Plan for surgery on September 06, 2017 with Dr. Precious Haws at [place] will be scheduled for a robotic assisted total hysterectomy, bilateral salpingo-oophorectomy, possible sentinel lymph node biopsy, possible lymphadenectomy, possible laparotomy.  STOP TAKING COUMADIN 7 DAYS PRIOR TO SURGERY.  BEGIN TAKING MEGACE 80 MG TWICE A DAY.  Pre-operative Testing -You will receive a phone call from presurgical testing at [facility] to arrange for a pre-operative testing appointment before your surgery.  This appointment normally occurs one to two weeks before your scheduled surgery.   -Bring your insurance card, copy of an advanced directive if applicable, medication list  -At that visit, you will be asked to sign a consent for a possible blood transfusion in case a transfusion becomes necessary during surgery.  The need for a blood transfusion is rare but having consent is a necessary part of your care.     -You should not be taking blood thinners or aspirin at least ten days prior to surgery unless instructed by your surgeon.  Day Before Surgery at [place] will be asked to take in a light diet the day before surgery.  Avoid carbonated beverages.  You will be advised to have nothing to eat or drink after midnight the evening before.    Eat a light diet the day before surgery.  Examples including soups, broths, toast, yogurt, mashed potatoes.  Things to avoid include carbonated beverages  (fizzy beverages), raw fruits and raw vegetables, or beans.   If your bowels are filled with gas, your surgeon will have difficulty visualizing your pelvic organs which increases your surgical risks.  Your role in recovery Your role is to become active as soon as directed by your doctor, while still giving yourself time to heal.  Rest when you feel tired. You will be asked to do the following in order to speed your recovery:  - Cough and  breathe deeply. This helps toclear and expand your lungs and can prevent pneumonia. You may be given a spirometer to practice deep breathing. A staff member will show you how to use the spirometer. - Do mild physical activity. Walking or moving your legs help your circulation and body functions return to normal. A staff member will help you when you try to walk and will provide you with simple exercises. Do not try to get up or walk alone the first time. - Actively manage your pain. Managing your pain lets you move in comfort. We will ask you to rate your pain on a scale of zero to 10. It is your responsibility to tell your doctor or nurse where and how much you hurt so your pain can be treated.  Special Considerations -If you are diabetic, you may be placed on insulin after surgery to have closer control over your blood sugars to promote healing and recovery.  This does not mean that you will be discharged on insulin.  If applicable, your oral antidiabetics will be resumed when you are tolerating a solid diet.  -Your final pathology results from surgery should be available by the Friday after surgery and the results will be relayed to you when available.  -Dr. Lahoma Crocker is the Surgeon that assists your GYN Oncologist with surgery.  The next day after your surgery you will either see your GYN Oncologist or Dr. Lahoma Crocker.   Blood Transfusion Information WHAT IS A BLOOD TRANSFUSION? A transfusion is the replacement of blood or some of  its parts. Blood is made up of multiple cells which provide different functions.  Red blood cells carry oxygen and are used for blood loss replacement.  White blood cells fight against infection.  Platelets control bleeding.  Plasma helps clot blood.  Other blood products are available for specialized needs, such as hemophilia or other clotting disorders. BEFORE THE TRANSFUSION  Who gives blood for transfusions?   You may be able to donate  blood to be used at a later date on yourself (autologous donation).  Relatives can be asked to donate blood. This is generally not any safer than if you have received blood from a stranger. The same precautions are taken to ensure safety when a relative's blood is donated.  Healthy volunteers who are fully evaluated to make sure their blood is safe. This is blood bank blood. Transfusion therapy is the safest it has ever been in the practice of medicine. Before blood is taken from a donor, a complete history is taken to make sure that person has no history of diseases nor engages in risky social behavior (examples are intravenous drug use or sexual activity with multiple partners). The donor's travel history is screened to minimize risk of transmitting infections, such as malaria. The donated blood is tested for signs of infectious diseases, such as HIV and hepatitis. The blood is then tested to be sure it is compatible with you in order to minimize the chance of a transfusion reaction. If you or a relative donates blood, this is often done in anticipation of surgery and is not appropriate for emergency situations. It takes many days to process the donated blood. RISKS AND COMPLICATIONS Although transfusion therapy is very safe and saves many lives, the main dangers of transfusion include:   Getting an infectious disease.  Developing a transfusion reaction. This is an allergic reaction to something in the blood you were given. Every precaution is taken to prevent this. The decision to have a blood transfusion has been considered carefully by your caregiver before blood is given. Blood is not given unless the benefits outweigh the risks.

## 2017-08-19 NOTE — Progress Notes (Signed)
Consult Note: Gyn-Onc  Consult was requested by Dr. Harolyn Rutherford for the evaluation of Charlotte Castro 84 y.o. female  CC:  Chief Complaint  Patient presents with  . Endometrial ca [facility])    Assessment/Plan:  Charlotte Castro  is a 84 y.o.  year old with grade I endometrioid endometrioid adenocarcinoma. She has a history of atrial fibrillation on xarelto.  She has a history of CHF but a normal stress echo in December, 2018 with EF of 68% and has good functional status.  A detailed discussion was held with the patient and her family with regard to to her endometrial cancer diagnosis. We discussed the standard management options for uterine cancer which includes surgery followed possibly by adjuvant therapy depending on the results of surgery. The options for surgical management include a hysterectomy and removal of the tubes and ovaries possibly with removal of pelvic and para-aortic lymph nodes.If feasible, a minimally invasive approach including a robotic hysterectomy or laparoscopic hysterectomy have benefits including shorter hospital stay, recovery time and better wound healing than with open surgery. The patient has been counseled about these surgical options and the risks of surgery in general including infection, bleeding, damage to surrounding structures (including bowel, bladder, ureters, nerves or vessels), and the postoperative risks of PE/ DVT, and lymphedema. I extensively reviewed the additional risks of robotic hysterectomy including possible need for conversion to open laparotomy.  I discussed positioning during surgery of trendelenberg and risks of minor facial swelling and care we take in preoperative positioning.  After counseling and consideration of her options, she desires to proceed with robotic assisted total hysterectomy with bilateral sapingo-oophorectomy and SLN biopsy, possible lymphadenectomy. She understands that lymphadenectomy will be performed based on the need for her to  undergo this procedure based on uterine pathology and her tolerance of general anesthesia.  I discussed that there is a medical alternative to surgery, that being continued progestin therapy. It would avoid the surgical risks, however is typically less definitive. The patient strenuously desired to proceed with surgery as a definitive option. Her children were present and in support of her decision.  Due to her general frailty, she is recommended to be discharged home with somebody at home.  I mentioned this to her children so that they can coordinate this care.  I do not think that she will qualify for postoperative rehab placement because unlike her prior surgery in 2018, she is not unwell with an underlying illness (at that time she had gallstone pancreatitis).  Therefore I expect she will recover fairly quickly after surgery.  Having said that given her age and underlying medical conditions, she is at increased risk of perioperative morbidity and will likely need some assistance with activities of daily living before living with a family member or friend is ideal in the first week or 2 after surgery.  She has increased surgical risks of bleeding due to her Coumadin use.  We checked her INR level today and is therapeutic at 2.5.  We will hold Coumadin for 5 days preoperatively.  As its indication is for atrial fibrillation we will not perform bridging with Lovenox in order to minimize perioperative bleeding risk.  Even with these precautions I explained that perioperative bleeding risk is still elevated in a patient who has been on recent anticoagulation.  Additionally coming off anticoagulation places her at increased risk for perioperative stroke.  I discussed the delicate balance between risks that present in this situation.   She will be seen  by anesthesia for preoperative clearance and discussion of postoperative pain management.  She was given the opportunity to ask questions, which were answered  to her satisfaction, and she is agreement with the above mentioned plan of care.  In order to expedite her surgical care, her surgery will be performed by my partner, Dr Gerarda Fraction.   HPI: Charlotte Castro is an 84 year old parous woman who is seen in consultation at the request of Dr Harolyn Rutherford for grade 1 endometrial cancer.  The patient reports that 2-12-monthhistory of vaginal bleeding which is light.  She was taken to the operating room on 520-second 2019 [facility]O grade 1 endometrial adenocarcinoma.  Prior to surgery she had undergone an ultrasound scan which revealed a uterus measuring 5.6 x 3.2 x 3.3 cm, with a thickened endometrium of 12 mm.  The right and left ovary were not visualized.  There is no abnormal free fluid.  She was prescribed megace therapy.   The patient's past medical history is significant for the below stated conditions.  In particular she has atrial fibrillation and atrial flutter for which she is on multiple cardiac medications and anticoagulation with Coumadin therapeutic to 84.  She also has had a recent history [facility]  She had laparoscopic cholecystectomy and a total 7-day hospital stay followed by 21-day rehab stay for general deconditioning.  The patient was discharged to home where she lives alone.  Other past medical history of significance is gastritis.  She has gastroesophageal reflux disease and chronic nausea which is unexplained.  She has been seen by gastroenterologist for this recently however has not had an EGD.  No imaging has revealed evidence of an etiology for her persistent nausea.  She has a history of lumbar spine surgery shoulder surgery and fracture surgery.  She has a history of a cardiac catheterization in 2012.  She also had atrial ablation surgery in 2012.  With respect to cardiac workup her [facility]   Her most recent cardiac echo was in December 2018 which was a stress echo and revealed an ejection fraction of 68%.   Current Meds:  Outpatient Encounter Medications as of 08/19/2017  Medication Sig  . acetaminophen (TYLENOL) 325 MG tablet Take 325 mg by mouth every 4 (four) hours as needed for headache (pain). Reported on 10/04/2015  . Cyanocobalamin 1000 MCG/ML KIT Inject 1,000 mcg as directed every 30 (thirty) days.  .Marland Kitchendocusate sodium (COLACE) 100 MG capsule Take 1 capsule (100 mg total) by mouth 2 (two) times daily as needed for mild constipation or moderate constipation.  .Marland Kitchenlevothyroxine (SYNTHROID, LEVOTHROID) 88 MCG tablet Take 1 tablet (88 mcg total) by mouth daily before breakfast.  . megestrol (MEGACE) 40 MG tablet Take 2 tablets (80 mg total) by mouth 2 (two) times daily.  . metoprolol (LOPRESSOR) 50 MG tablet Take 1 tablet (50 mg total) by mouth 2 (two) times daily.  .Marland Kitchenwarfarin (COUMADIN) 5 MG tablet TAKE 1/2 TO 1 TABLET DAILY AS DIRECTED BY COUMADIN CLINIC  . [DISCONTINUED] megestrol (MEGACE) 40 MG tablet Take 2 tablets (80 mg total) by mouth 3 (three) times daily.  . famotidine (PEPCID) 20 MG tablet Take 1 tablet (20 mg total) by mouth 2 (two) times daily.  .Marland KitchenoxyCODONE-acetaminophen (PERCOCET/ROXICET) 5-325 MG tablet Take 1 tablet by mouth every 4 (four) hours as needed for severe pain. (Patient  not taking: Reported on 08/19/2017)  . [DISCONTINUED] gentamicin cream (GARAMYCIN) 0.1 % Apply 1 application topically 3 (three) times daily.   No facility-administered encounter medications on file as of 08/19/2017.     Allergy:  Allergies  Allergen Reactions  . Iodinated Diagnostic Agents Shortness Of Breath    headache  . Penicillins Other (See Comments)    Tolerated Zosyn Oct 2018    Social Hx:   Social History   Socioeconomic History  . Marital status: Widowed    Spouse name: Not on file  . Number of children: 2  . Years of education: 57  . Highest education level: Not on file   Social Needs  . Financial resource strain: Not on file  . Food insecurity - worry: Not on file  . Food insecurity - inability: Not on file  . Transportation needs - medical: Not on file  . Transportation needs - non-medical: Not on file  Occupational History  . Not on file  Tobacco Use  . Smoking status: Never Smoker  . Smokeless tobacco: Never Used  Substance and Sexual Activity  . Alcohol use: No  . Drug use: No  . Sexual activity: No  Other Topics Concern  . Not on file  Social History Narrative   Lives alone in a one story home.  Has 2 children.  Retired.  Education: high school.      Past Surgical Hx:  Past Surgical History:  Procedure Laterality Date  . ATRIAL ABLATION SURGERY  11/17/10   Atrial tachycardia arising from [facility] of the aorta ablated by JA  . BACK SURGERY    . CARDIAC CATHETERIZATION  01/11/2011   Archie Endo 01/12/2011  . CHOLECYSTECTOMY N/A 04/08/2017   Procedure: LAPAROSCOPIC CHOLECYSTECTOMY;  Surgeon: Georganna Skeans, MD;  Location: [place];  Service: General;  Laterality: N/A;  . FRACTURE SURGERY    . HYSTEROSCOPY W/D&C N/A 08/05/2017   Procedure: DILATATION AND CURETTAGE /HYSTEROSCOPY AND POLYPECTOMY;  Surgeon: Osborne Oman, MD;  Location: [facility];  Service: Gynecology;  Laterality: N/A;  . LUMBAR SPINE SURGERY  ~ 1998  . SHOULDER SURGERY Right 1984 X2   Rollene Rotunda 01/19/2011  . WRIST FRACTURE SURGERY Left 1983    Past Medical Hx:  Past Medical History:  Diagnosis Date  . Anxiety   . Arthritis    "left knee" (04/05/2017)  . Atrial fibrillation ([place])   . Atrial flutter ([place])    typical appearing  . Atrial tachycardia ([place])    ablated 11/17/10  by JA  from the [facility] of the aorta  . Chronic lower back pain   . Dizziness    chronic and of an unclear etiology  . Gastritis   . GERD (gastroesophageal reflux disease)   . History of blood transfusion ~ 1948  . HTN (hypertension)   . Hyperlipemia   . Hypothyroidism   . Obesity   . Osteoporosis   . Sinus  headache   . Vitamin D deficiency     Past Gynecological History:  SVD x 2 No LMP recorded. Patient is postmenopausal.  Family Hx:  Family History  Problem Relation Age of Onset  . Heart attack Father   . Hypertension Father     Review of Systems:  Constitutional  Feels fatigued   ENT Normal appearing ears and nares bilaterally Skin/Breast  No rash, sores, jaundice, itching, dryness Cardiovascular  No chest pain, shortness of breath, or edema  Pulmonary  No cough or wheeze.  Gastro Intestinal  No vomitting, or  diarrhoea. No bright red blood per rectum, change in bowel movement, or constipation. + cramping abdominal pain. + nausea. Genito Urinary  No frequency, urgency, dysuria, + vaginal bleeding Musculo Skeletal  No myalgia, arthralgia, joint swelling or pain  Neurologic  No weakness, numbness, change in gait,  Psychology  No depression, anxiety, insomnia.   Vitals:  Blood pressure (!) 162/95, pulse 93, temperature 97.8 F (36.6 C), temperature source Oral, resp. rate 18, height 5' 8"  (1.727 m), weight 198 lb 8 oz (90 kg), SpO2 100 %.  Physical Exam: WD in NAD Neck  Supple NROM, without any enlargements.  Lymph Node Survey No cervical supraclavicular or inguinal adenopathy Cardiovascular  Pulse normal rate, regularity and rhythm. S1 and S2 normal.  Lungs  Clear to auscultation bilateraly, without wheezes/crackles/rhonchi. Good air movement.  Skin  No rash/lesions/breakdown  Psychiatry  Alert and oriented to person, place, and time  Abdomen  Normoactive bowel sounds, abdomen soft, non-tender and nonobese without evidence of hernia.  Back No CVA tenderness Genito Urinary  Vulva/vagina: Normal external female genitalia.   No lesions. No discharge or bleeding.  Bladder/urethra:  No lesions or masses, well supported bladder  Vagina: narrow and atrophic, but grossly normal  Cervix: Normal appearing, no lesions.  Uterus:  Small, mobile, no parametrial  involvement or nodularity.  Adnexa: no palpable masses. Rectal  deferred Extremities  No bilateral cyanosis, clubbing or edema.   Thereasa Solo, MD  08/19/2017, 5:25 PM

## 2017-08-23 ENCOUNTER — Telehealth: Payer: Self-pay | Admitting: Gynecologic Oncology

## 2017-08-23 NOTE — Telephone Encounter (Signed)
Called and informed the patient that her surgery has been moved to April 9 with Dr. Everitt Amber.  Advised that her last dose of Coumadin would be on April 2 per Dr. Denman George.  All questions answered.  I will contact her son as well.  Advised to call for any needs.  Left message for the patient's son with the above information and asked him to call the office to confirm he has received the info.

## 2017-08-30 ENCOUNTER — Ambulatory Visit (INDEPENDENT_AMBULATORY_CARE_PROVIDER_SITE_OTHER): Payer: Medicare Other | Admitting: Internal Medicine

## 2017-08-30 ENCOUNTER — Other Ambulatory Visit: Payer: Self-pay

## 2017-08-30 ENCOUNTER — Telehealth: Payer: Self-pay | Admitting: Interventional Cardiology

## 2017-08-30 DIAGNOSIS — Z5181 Encounter for therapeutic drug level monitoring: Secondary | ICD-10-CM | POA: Diagnosis not present

## 2017-08-30 DIAGNOSIS — I4821 Permanent atrial fibrillation: Secondary | ICD-10-CM

## 2017-08-30 DIAGNOSIS — I482 Chronic atrial fibrillation: Secondary | ICD-10-CM

## 2017-08-30 MED ORDER — WARFARIN SODIUM 5 MG PO TABS: ORAL_TABLET | ORAL | 0 refills | Status: DC

## 2017-08-30 NOTE — Telephone Encounter (Signed)
Spoke with pt's dtr & explained that without having INR checked we cannot send in any Coumadin tabs and she verbalized understanding. Pt had her INR checked at [facility] on 3/81/19 & spoke with PharmD Georgina Peer and she approved to use the INR. After much explanation about how important having INR checked and dosed appropriately she understood & set an appt for the pt for follow up in 2 weeks while the pt is out that day. She will be coming from another appt. Also, she stated she would stay on top of the appts. Reminded pt to call with any conflicts & update [place] so we know when pt has had. Please refer to pt anticoagulation encounter for INR dosing.

## 2017-08-30 NOTE — Telephone Encounter (Signed)
New message    Patient daughter requesting to speak with Dr Irish Lack.  Daughter is upset that her mother is being asked to schedule an appointment with the Coumadin clinic in order to get refill on medication, even with last PTINR  result from 08/19/17 from hospital. Please call

## 2017-08-30 NOTE — Telephone Encounter (Signed)
Pt requesting a refill on her Warfarin; pt has not been seen since 07/14/17; pt aware that she needs an appt as soon as possible. Pt states she has not been taking the Coumadin for 2 days, advised that she will need to have INR checked & monitored by someone & she stated that she will call Dr. Denman George her Cancer Doctor to see what she can work out to have it checked when she goes there. Educated pt on the importance of taking her Coumadin daily and not missing doses as the adverse effects are having a blood clot, stroke, death, or bleeding & she verbalized understanding.  Will await a call back from the pt.

## 2017-09-09 ENCOUNTER — Encounter (HOSPITAL_COMMUNITY): Payer: Self-pay

## 2017-09-09 NOTE — Patient Instructions (Addendum)
Your procedure is scheduled on: Tuesday, September 20, 2017   Surgery Time:  10:00AM-12 Noon   Report to [place]  Entrance    Report to admitting at 8:00 AM   Call this number if you have problems the morning of surgery [phone]   Eat a light diet the day before surgery. Examples including soups, broths, toast, yogurt, mashed potatoes. Things to avoid include carbonated beverages (fizzy beverages), raw fruits and raw vegetables, or beans.    If your bowels are filled with gas, your surgeon will have difficulty visualizing your pelvic organs which increases your surgical risks.    Do not eat food or drink liquids :After Midnight.   Do NOT smoke after Midnight   Complete one Ensure drink the morning of surgery by 7:00AM the day of surgery.   Take these medicines the morning of surgery with A SIP OF WATER: Levothyroxine              You may not have any metal on your body including hair pins, jewelry, and body piercings             Do not wear make-up, lotions, powders, perfumes/cologne, or deodorant             Do not wear nail polish.  Do not shave  48 hours prior to surgery.                Do not bring valuables to the hospital. [place].   Contacts, dentures or bridgework may not be worn into surgery.   Leave suitcase in the car. After surgery it may be brought to your room.   Special Instructions: Bring a copy of your healthcare power of attorney and living will documents         the day of surgery if you haven't scanned them in before.              Please read over the following fact sheets you were given:  [facility] - Preparing for Surgery Before surgery, you can play an important role.  Because skin is not sterile, your skin needs to be as free of germs as possible.  You can reduce the number of germs on your skin by washing with CHG (chlorahexidine gluconate) soap before surgery.  CHG is an  antiseptic cleaner which kills germs and bonds with the skin to continue killing germs even after washing. Please DO NOT use if you have an allergy to CHG or antibacterial soaps.  If your skin becomes reddened/irritated stop using the CHG and inform your nurse when you arrive at Short Stay. Do not shave (including legs and underarms) for at least 48 hours prior to the first CHG shower.  You may shave your face/neck.  Please follow these instructions carefully:  1.  Shower with CHG Soap the night before surgery and the  morning of surgery.  2.  If you choose to wash your hair, wash your hair first as usual with your normal  shampoo.  3.  After you shampoo, rinse your hair and body thoroughly to remove the shampoo.                             4.  Use CHG as you would any other liquid soap.  You can apply  chg directly to the skin and wash.  Gently with a scrungie or clean washcloth.  5.  Apply the CHG Soap to your body ONLY FROM THE NECK DOWN.   Do   not use on face/ open                           Wound or open sores. Avoid contact with eyes, ears mouth and   genitals (private parts).                       Wash face,  Genitals (private parts) with your normal soap.             6.  Wash thoroughly, paying special attention to the area where your    surgery  will be performed.  7.  Thoroughly rinse your body with warm water from the neck down.  8.  DO NOT shower/wash with your normal soap after using and rinsing off the CHG Soap.                9.  Pat yourself dry with a clean towel.            10.  Wear clean pajamas.            11.  Place clean sheets on your bed the night of your first shower and do not  sleep with pets. Day of Surgery : Do not apply any lotions/deodorants the morning of surgery.  Please wear clean clothes to the hospital/surgery center.  FAILURE TO FOLLOW THESE INSTRUCTIONS MAY RESULT IN THE CANCELLATION OF YOUR SURGERY  PATIENT  SIGNATURE_________________________________  NURSE SIGNATURE__________________________________  ________________________________________________________________________   Charlotte Castro  An incentive spirometer is a tool that can help keep your lungs clear and active. This tool measures how well you are filling your lungs with each breath. Taking long deep breaths may help reverse or decrease the chance of developing breathing (pulmonary) problems (especially infection) following:  A long period of time when you are unable to move or be active. BEFORE THE PROCEDURE   If the spirometer includes an indicator to show your best effort, your nurse or respiratory therapist will set it to a desired goal.  If possible, sit up straight or lean slightly forward. Try not to slouch.  Hold the incentive spirometer in an upright position. INSTRUCTIONS FOR USE  1. Sit on the edge of your bed if possible, or sit up as far as you can in bed or on a chair. 2. Hold the incentive spirometer in an upright position. 3. Breathe out normally. 4. Place the mouthpiece in your mouth and seal your lips tightly around it. 5. Breathe in slowly and as deeply as possible, raising the piston or the ball toward the top of the column. 6. Hold your breath for 3-5 seconds or for as long as possible. Allow the piston or ball to fall to the bottom of the column. 7. Remove the mouthpiece from your mouth and breathe out normally. 8. Rest for a few seconds and repeat Steps 1 through 7 at least 10 times every 1-2 hours when you are awake. Take your time and take a few normal breaths between deep breaths. 9. The spirometer may include an indicator to show your best effort. Use the indicator as a goal to work toward during each repetition. 10. After each set of 10 deep breaths, practice coughing to be sure your lungs  are clear. If you have an incision (the cut made at the time of surgery), support your incision when coughing  by placing a pillow or rolled up towels firmly against it. Once you are able to get out of bed, walk around indoors and cough well. You may stop using the incentive spirometer when instructed by your caregiver.  RISKS AND COMPLICATIONS  Take your time so you do not get dizzy or light-headed.  If you are in pain, you may need to take or ask for pain medication before doing incentive spirometry. It is harder to take a deep breath if you are having pain. AFTER USE  Rest and breathe slowly and easily.  It can be helpful to keep track of a log of your progress. Your caregiver can provide you with a simple table to help with this. If you are using the spirometer at home, follow these instructions: [place] IF:   You are having difficultly using the spirometer.  You have trouble using the spirometer as often as instructed.  Your pain medication is not giving enough relief while using the spirometer.  You develop fever of 100.5 F (38.1 C) or higher. SEEK IMMEDIATE MEDICAL CARE IF:   You cough up bloody sputum that had not been present before.  You develop fever of 102 F (38.9 C) or greater.  You develop worsening pain at or near the incision site. MAKE SURE YOU:   Understand these instructions.  Will watch your condition.  Will get help right away if you are not doing well or get worse. Document Released: 10/11/2006 Document Revised: 08/23/2011 Document Reviewed: 12/12/2006 ExitCare Patient Information 2014 Exit[employer], [place].   ________________________________________________________________________  WHAT IS A BLOOD TRANSFUSION? Blood Transfusion Information  A transfusion is the replacement of blood or some of its parts. Blood is made up of multiple cells which provide different functions.  Red blood cells carry oxygen and are used for blood loss replacement.  White blood cells fight against infection.  Platelets control bleeding.  Plasma helps clot  blood.  Other blood products are available for specialized needs, such as hemophilia or other clotting disorders. BEFORE THE TRANSFUSION  Who gives blood for transfusions?   Healthy volunteers who are fully evaluated to make sure their blood is safe. This is blood bank blood. Transfusion therapy is the safest it has ever been in the practice of medicine. Before blood is taken from a donor, a complete history is taken to make sure that person has no history of diseases nor engages in risky social behavior (examples are intravenous drug use or sexual activity with multiple partners). The donor's travel history is screened to minimize risk of transmitting infections, such as malaria. The donated blood is tested for signs of infectious diseases, such as HIV and hepatitis. The blood is then tested to be sure it is compatible with you in order to minimize the chance of a transfusion reaction. If you or a relative donates blood, this is often done in anticipation of surgery and is not appropriate for emergency situations. It takes many days to process the donated blood. RISKS AND COMPLICATIONS Although transfusion therapy is very safe and saves many lives, the main dangers of transfusion include:   Getting an infectious disease.  Developing a transfusion reaction. This is an allergic reaction to something in the blood you were given. Every precaution is taken to prevent this. The decision to have a blood transfusion has been considered carefully by your caregiver before blood  is given. Blood is not given unless the benefits outweigh the risks. AFTER THE TRANSFUSION  Right after receiving a blood transfusion, you will usually feel much better and more energetic. This is especially true if your red blood cells have gotten low (anemic). The transfusion raises the level of the red blood cells which carry oxygen, and this usually causes an energy increase.  The nurse administering the transfusion will monitor  you carefully for complications. HOME CARE INSTRUCTIONS  No special instructions are needed after a transfusion. You may find your energy is better. Speak with your caregiver about any limitations on activity for underlying diseases you may have. SEEK MEDICAL CARE IF:   Your condition is not improving after your transfusion.  You develop redness or irritation at the intravenous (IV) site. SEEK IMMEDIATE MEDICAL CARE IF:  Any of the following symptoms occur over the next 12 hours:  Shaking chills.  You have a temperature by mouth above 102 F (38.9 C), not controlled by medicine.  Chest, back, or muscle pain.  People around you feel you are not acting correctly or are confused.  Shortness of breath or difficulty breathing.  Dizziness and fainting.  You get a rash or develop hives.  You have a decrease in urine output.  Your urine turns a dark color or changes to pink, red, or brown. Any of the following symptoms occur over the next 10 days:  You have a temperature by mouth above 102 F (38.9 C), not controlled by medicine.  Shortness of breath.  Weakness after normal activity.  The white part of the eye turns yellow (jaundice).  You have a decrease in the amount of urine or are urinating less often.  Your urine turns a dark color or changes to pink, red, or brown. Document Released: 05/28/2000 Document Revised: 08/23/2011 Document Reviewed: 01/15/2008 [facility] Patient Information 2014 [place], [place].  _______________________________________________________________________

## 2017-09-09 NOTE — Pre-Procedure Instructions (Signed)
The following are in epic: EKG 08/05/2017 CXR 06/10/2017 Stress 06/10/2017

## 2017-09-13 ENCOUNTER — Other Ambulatory Visit: Payer: Self-pay

## 2017-09-13 ENCOUNTER — Ambulatory Visit (INDEPENDENT_AMBULATORY_CARE_PROVIDER_SITE_OTHER): Payer: Medicare Other | Admitting: Pharmacist

## 2017-09-13 ENCOUNTER — Encounter (HOSPITAL_COMMUNITY): Payer: Self-pay

## 2017-09-13 ENCOUNTER — Encounter (HOSPITAL_COMMUNITY)
Admission: RE | Admit: 2017-09-13 | Discharge: 2017-09-13 | Disposition: A | Discharge status: Home / Self Care | Payer: Medicare Other | Source: Ambulatory Visit | Attending: Gynecologic Oncology | Admitting: Gynecologic Oncology

## 2017-09-13 DIAGNOSIS — Z5181 Encounter for therapeutic drug level monitoring: Secondary | ICD-10-CM

## 2017-09-13 DIAGNOSIS — C541 Malignant neoplasm of endometrium: Secondary | ICD-10-CM | POA: Diagnosis not present

## 2017-09-13 DIAGNOSIS — I482 Chronic atrial fibrillation: Secondary | ICD-10-CM

## 2017-09-13 DIAGNOSIS — Z79899 Other long term (current) drug therapy: Secondary | ICD-10-CM | POA: Diagnosis not present

## 2017-09-13 DIAGNOSIS — Z01818 Encounter for other preprocedural examination: Secondary | ICD-10-CM | POA: Insufficient documentation

## 2017-09-13 DIAGNOSIS — Z79890 Hormone replacement therapy (postmenopausal): Secondary | ICD-10-CM | POA: Insufficient documentation

## 2017-09-13 DIAGNOSIS — Z7901 Long term (current) use of anticoagulants: Secondary | ICD-10-CM | POA: Insufficient documentation

## 2017-09-13 DIAGNOSIS — I4821 Permanent atrial fibrillation: Secondary | ICD-10-CM

## 2017-09-13 HISTORY — DX: Personal history of other diseases of the digestive system: Z87.19

## 2017-09-13 HISTORY — DX: Heart failure, unspecified: I50.9

## 2017-09-13 HISTORY — DX: Dyspnea, unspecified: R06.00

## 2017-09-13 HISTORY — DX: Malignant neoplasm of endometrium: C54.1

## 2017-09-13 HISTORY — DX: Biliary acute pancreatitis without necrosis or infection: K85.10

## 2017-09-13 HISTORY — DX: Nausea: R11.0

## 2017-09-13 HISTORY — DX: Non-pressure chronic ulcer of other part of unspecified foot with unspecified severity: L97.509

## 2017-09-13 HISTORY — DX: Postmenopausal bleeding: N95.0

## 2017-09-13 HISTORY — DX: Anesthesia of skin: R20.0

## 2017-09-13 HISTORY — DX: Chest pain, unspecified: R07.9

## 2017-09-13 HISTORY — DX: Anemia, unspecified: D64.9

## 2017-09-13 LAB — COMPREHENSIVE METABOLIC PANEL
ALBUMIN: 3.9 g/dL (ref 3.5–5.0)
ALT: 16 U/L (ref 14–54)
ANION GAP: 10 (ref 5–15)
AST: 22 U/L (ref 15–41)
Alkaline Phosphatase: 79 U/L (ref 38–126)
BILIRUBIN TOTAL: 0.8 mg/dL (ref 0.3–1.2)
BUN: 20 mg/dL (ref 6–20)
CO2: 20 mmol/L — AB (ref 22–32)
Calcium: 9.5 mg/dL (ref 8.9–10.3)
Chloride: 111 mmol/L (ref 101–111)
Creatinine, Ser: 0.9 mg/dL (ref 0.44–1.00)
GFR calc Af Amer: >60 mL/min (ref >60)
GFR calc non Af Amer: 58 mL/min — ABNORMAL LOW (ref >60)
GLUCOSE: 125 mg/dL — AB (ref 65–99)
Potassium: 3.9 mmol/L (ref 3.5–5.1)
SODIUM: 141 mmol/L (ref 135–145)
TOTAL PROTEIN: 7 g/dL (ref 6.5–8.1)

## 2017-09-13 LAB — CBC
HEMATOCRIT: 42.4 % (ref 36.0–46.0)
HEMOGLOBIN: 15.2 g/dL — AB (ref 12.0–15.0)
MCH: 33 pg (ref 26.0–34.0)
MCHC: 35.8 g/dL (ref 30.0–36.0)
MCV: 92.2 fL (ref 78.0–100.0)
Platelets: 222 10*3/uL (ref 150–400)
RBC: 4.6 MIL/uL (ref 3.87–5.11)
RDW: 13.2 % (ref 11.5–15.5)
WBC: 10 10*3/uL (ref 4.0–10.5)

## 2017-09-13 LAB — PROTIME-INR
INR: 2.2
Prothrombin Time: 24.2 seconds — ABNORMAL HIGH (ref 11.4–15.2)

## 2017-09-13 LAB — POCT INR: INR: 2.5

## 2017-09-13 LAB — ABO/RH: ABO/RH(D): A POS

## 2017-09-13 MED ORDER — WARFARIN SODIUM 5 MG PO TABS: ORAL_TABLET | ORAL | 2 refills | Status: DC

## 2017-09-13 NOTE — Pre-Procedure Instructions (Signed)
Discussed Charlotte Castro's medical history with Dr. Jillyn Hidden.  Discussed ECHO 05/2017, Dr, Jillyn Hidden states we did not need a separate cardiac clearance at this time.

## 2017-09-13 NOTE — Pre-Procedure Instructions (Signed)
CBC, CMP, and PT results 09/13/2017 faxed to Dr. Denman George and Jerilynn Mages. Cross NP via epic.

## 2017-09-13 NOTE — Patient Instructions (Signed)
Description   Continue taking Coumadin 1 tablet (5mg ) every day except 1/2 tablet (2.5mg ) on Sundays. Last dose of Coumadin is April 3 before your procedure. Resume Coumadin April 9th in the evening after your surgery. Recheck INR 1 week after surgery. Call if placed on any new medications [phone] or if you have any upcoming procedures.

## 2017-09-14 ENCOUNTER — Telehealth: Payer: Self-pay

## 2017-09-14 DIAGNOSIS — I4821 Permanent atrial fibrillation: Secondary | ICD-10-CM

## 2017-09-14 DIAGNOSIS — I10 Essential (primary) hypertension: Secondary | ICD-10-CM

## 2017-09-14 MED ORDER — METOPROLOL TARTRATE 50 MG PO TABS: 50.0000 mg | ORAL_TABLET | Freq: Two times a day (BID) | ORAL | 0 refills | Status: DC

## 2017-09-14 NOTE — Telephone Encounter (Signed)
-----   Message from Leeroy Bock, [facility] sent at 09/13/2017 10:37 AM EDT ----- Algis Greenhouse,  I saw [name] for her INR check today and she was asking about her metoprolol. She states she ran out but I only see that she had this filled once for a 1 month supply and it also came from a different office. I wasn't sure if she should still be taking it and did not want to send in a refill without checking.  Thanks, [employer]

## 2017-09-14 NOTE — Telephone Encounter (Signed)
Called patient who states that she is out of her metoprolol. Patient is overdue for her follow-up appointment with Dr. Irish Lack. 30-day refill sent in for metoprolol (lopressor) 50 mg BID to patient's preferred pharmacy. Appointment made for patient to see Dr. Irish Lack on 4/17 at 9:40 AM, the same day as her coumadin appointment.

## 2017-09-15 ENCOUNTER — Encounter: Payer: Self-pay | Admitting: Gynecologic Oncology

## 2017-09-15 NOTE — Progress Notes (Signed)
Spoke with pre-surgical testing about need for patient to come in earlier than her assigned time the day of surgery to have a PT/INR and PTT checked per Dr. Denman George.

## 2017-09-20 ENCOUNTER — Encounter (HOSPITAL_COMMUNITY): Payer: Self-pay | Admitting: *Deleted

## 2017-09-20 ENCOUNTER — Ambulatory Visit (HOSPITAL_COMMUNITY): Payer: Medicare Other | Admitting: Anesthesiology

## 2017-09-20 ENCOUNTER — Ambulatory Visit (HOSPITAL_COMMUNITY)
Admission: RE | Admit: 2017-09-20 | Discharge: 2017-09-21 | Disposition: A | Discharge status: Home / Self Care | Payer: Medicare Other | Source: Ambulatory Visit | Attending: Gynecologic Oncology | Admitting: Gynecologic Oncology

## 2017-09-20 ENCOUNTER — Encounter (HOSPITAL_COMMUNITY)
Admission: RE | Disposition: A | Discharge status: Home / Self Care | Payer: Self-pay | Source: Ambulatory Visit | Attending: Gynecologic Oncology

## 2017-09-20 ENCOUNTER — Other Ambulatory Visit: Payer: Self-pay

## 2017-09-20 DIAGNOSIS — Z7901 Long term (current) use of anticoagulants: Secondary | ICD-10-CM | POA: Insufficient documentation

## 2017-09-20 DIAGNOSIS — C541 Malignant neoplasm of endometrium: Secondary | ICD-10-CM

## 2017-09-20 DIAGNOSIS — C775 Secondary and unspecified malignant neoplasm of intrapelvic lymph nodes: Secondary | ICD-10-CM | POA: Diagnosis not present

## 2017-09-20 DIAGNOSIS — Z79890 Hormone replacement therapy (postmenopausal): Secondary | ICD-10-CM | POA: Insufficient documentation

## 2017-09-20 DIAGNOSIS — C542 Malignant neoplasm of myometrium: Secondary | ICD-10-CM | POA: Insufficient documentation

## 2017-09-20 DIAGNOSIS — I4821 Permanent atrial fibrillation: Secondary | ICD-10-CM

## 2017-09-20 DIAGNOSIS — I110 Hypertensive heart disease with heart failure: Secondary | ICD-10-CM | POA: Diagnosis not present

## 2017-09-20 DIAGNOSIS — E039 Hypothyroidism, unspecified: Secondary | ICD-10-CM | POA: Insufficient documentation

## 2017-09-20 DIAGNOSIS — Z79818 Long term (current) use of other agents affecting estrogen receptors and estrogen levels: Secondary | ICD-10-CM | POA: Diagnosis not present

## 2017-09-20 DIAGNOSIS — Z79899 Other long term (current) drug therapy: Secondary | ICD-10-CM | POA: Diagnosis not present

## 2017-09-20 DIAGNOSIS — N800 Endometriosis of uterus: Secondary | ICD-10-CM | POA: Diagnosis not present

## 2017-09-20 DIAGNOSIS — I509 Heart failure, unspecified: Secondary | ICD-10-CM | POA: Insufficient documentation

## 2017-09-20 DIAGNOSIS — I739 Peripheral vascular disease, unspecified: Secondary | ICD-10-CM | POA: Insufficient documentation

## 2017-09-20 DIAGNOSIS — I4891 Unspecified atrial fibrillation: Secondary | ICD-10-CM | POA: Diagnosis not present

## 2017-09-20 DIAGNOSIS — K219 Gastro-esophageal reflux disease without esophagitis: Secondary | ICD-10-CM | POA: Diagnosis not present

## 2017-09-20 HISTORY — PX: ROBOTIC ASSISTED TOTAL HYSTERECTOMY WITH BILATERAL SALPINGO OOPHERECTOMY: SHX6086

## 2017-09-20 LAB — TYPE AND SCREEN
ABO/RH(D): A POS
Antibody Screen: NEGATIVE

## 2017-09-20 LAB — CBC
HCT: 38 % (ref 36.0–46.0)
Hemoglobin: 13.3 g/dL (ref 12.0–15.0)
MCH: 32.3 pg (ref 26.0–34.0)
MCHC: 35 g/dL (ref 30.0–36.0)
MCV: 92.2 fL (ref 78.0–100.0)
PLATELETS: 178 10*3/uL (ref 150–400)
RBC: 4.12 MIL/uL (ref 3.87–5.11)
RDW: 13.1 % (ref 11.5–15.5)
WBC: 9.2 10*3/uL (ref 4.0–10.5)

## 2017-09-20 LAB — PROTIME-INR
INR: 1.08
Prothrombin Time: 13.9 seconds (ref 11.4–15.2)

## 2017-09-20 LAB — APTT: aPTT: 27 seconds (ref 24–36)

## 2017-09-20 SURGERY — HYSTERECTOMY, TOTAL, ROBOT-ASSISTED, LAPAROSCOPIC, WITH BILATERAL SALPINGO-OOPHORECTOMY
Anesthesia: General | Site: Abdomen | Laterality: Bilateral

## 2017-09-20 MED ORDER — MIDAZOLAM HCL 5 MG/5ML IJ SOLN
INTRAMUSCULAR | Status: DC | PRN
  Administered 2017-09-20: 1 mg via INTRAVENOUS

## 2017-09-20 MED ORDER — ENOXAPARIN SODIUM 40 MG/0.4ML ~~LOC~~ SOLN
40.0000 mg | SUBCUTANEOUS | Status: DC
  Administered 2017-09-21: 40 mg via SUBCUTANEOUS
  Filled 2017-09-20: qty 0.4

## 2017-09-20 MED ORDER — SCOPOLAMINE 1 MG/3DAYS TD PT72
1.0000 | MEDICATED_PATCH | TRANSDERMAL | Status: DC
  Administered 2017-09-20: 1.5 mg via TRANSDERMAL
  Filled 2017-09-20: qty 1

## 2017-09-20 MED ORDER — STERILE WATER FOR INJECTION IJ SOLN
INTRAMUSCULAR | Status: AC
  Filled 2017-09-20: qty 10

## 2017-09-20 MED ORDER — MEPERIDINE HCL 50 MG/ML IJ SOLN: 6.2500 mg | INTRAMUSCULAR | Status: DC | PRN

## 2017-09-20 MED ORDER — CIPROFLOXACIN IN D5W 400 MG/200ML IV SOLN
400.0000 mg | INTRAVENOUS | Status: AC
  Administered 2017-09-20: 400 mg via INTRAVENOUS
  Filled 2017-09-20: qty 200

## 2017-09-20 MED ORDER — GABAPENTIN 300 MG PO CAPS
300.0000 mg | ORAL_CAPSULE | ORAL | Status: AC
  Administered 2017-09-20: 300 mg via ORAL
  Filled 2017-09-20: qty 1

## 2017-09-20 MED ORDER — ONDANSETRON HCL 4 MG PO TABS: 4.0000 mg | ORAL_TABLET | Freq: Four times a day (QID) | ORAL | Status: DC | PRN

## 2017-09-20 MED ORDER — INDOCYANINE GREEN 25 MG IV SOLR
INTRAVENOUS | Status: DC | PRN
  Administered 2017-09-20: 2.5 mg

## 2017-09-20 MED ORDER — SODIUM CHLORIDE 0.9 % IJ SOLN
INTRAMUSCULAR | Status: AC
  Filled 2017-09-20: qty 10

## 2017-09-20 MED ORDER — SENNOSIDES-DOCUSATE SODIUM 8.6-50 MG PO TABS
2.0000 | ORAL_TABLET | Freq: Every day | ORAL | Status: DC
  Filled 2017-09-20: qty 2

## 2017-09-20 MED ORDER — PROPOFOL 10 MG/ML IV BOLUS
INTRAVENOUS | Status: DC | PRN
  Administered 2017-09-20: 110 mg via INTRAVENOUS

## 2017-09-20 MED ORDER — MIDAZOLAM HCL 2 MG/2ML IJ SOLN
INTRAMUSCULAR | Status: AC
  Filled 2017-09-20: qty 2

## 2017-09-20 MED ORDER — DEXAMETHASONE SODIUM PHOSPHATE 10 MG/ML IJ SOLN
INTRAMUSCULAR | Status: AC
  Filled 2017-09-20: qty 1

## 2017-09-20 MED ORDER — LEVOTHYROXINE SODIUM 88 MCG PO TABS
88.0000 ug | ORAL_TABLET | Freq: Every day | ORAL | Status: DC
  Administered 2017-09-21: 88 ug via ORAL
  Filled 2017-09-20: qty 1

## 2017-09-20 MED ORDER — KCL IN DEXTROSE-NACL 20-5-0.45 MEQ/L-%-% IV SOLN
INTRAVENOUS | Status: DC
  Administered 2017-09-20: 23:00:00 via INTRAVENOUS
  Filled 2017-09-20: qty 1000

## 2017-09-20 MED ORDER — ONDANSETRON HCL 4 MG/2ML IJ SOLN
4.0000 mg | Freq: Four times a day (QID) | INTRAMUSCULAR | Status: DC | PRN
  Administered 2017-09-20: 4 mg via INTRAVENOUS
  Filled 2017-09-20: qty 2

## 2017-09-20 MED ORDER — IBUPROFEN 200 MG PO TABS
600.0000 mg | ORAL_TABLET | Freq: Four times a day (QID) | ORAL | Status: DC
  Administered 2017-09-21: 600 mg via ORAL
  Filled 2017-09-20: qty 3

## 2017-09-20 MED ORDER — METOPROLOL TARTRATE 50 MG PO TABS
50.0000 mg | ORAL_TABLET | Freq: Two times a day (BID) | ORAL | Status: DC
  Administered 2017-09-20 – 2017-09-21 (×2): 50 mg via ORAL
  Filled 2017-09-20 (×2): qty 1

## 2017-09-20 MED ORDER — PROPOFOL 10 MG/ML IV BOLUS
INTRAVENOUS | Status: AC
  Filled 2017-09-20: qty 20

## 2017-09-20 MED ORDER — CELECOXIB 200 MG PO CAPS
400.0000 mg | ORAL_CAPSULE | ORAL | Status: AC
  Administered 2017-09-20: 400 mg via ORAL
  Filled 2017-09-20: qty 2

## 2017-09-20 MED ORDER — CLINDAMYCIN PHOSPHATE 900 MG/50ML IV SOLN
900.0000 mg | INTRAVENOUS | Status: AC
  Administered 2017-09-20: 900 mg via INTRAVENOUS
  Filled 2017-09-20: qty 50

## 2017-09-20 MED ORDER — SUGAMMADEX SODIUM 200 MG/2ML IV SOLN
INTRAVENOUS | Status: AC
  Filled 2017-09-20: qty 2

## 2017-09-20 MED ORDER — SUGAMMADEX SODIUM 200 MG/2ML IV SOLN
INTRAVENOUS | Status: DC | PRN
  Administered 2017-09-20: 200 mg via INTRAVENOUS

## 2017-09-20 MED ORDER — DEXAMETHASONE SODIUM PHOSPHATE 4 MG/ML IJ SOLN
4.0000 mg | INTRAMUSCULAR | Status: AC
  Administered 2017-09-20: 10 mg via INTRAVENOUS

## 2017-09-20 MED ORDER — GABAPENTIN 300 MG PO CAPS
300.0000 mg | ORAL_CAPSULE | Freq: Every day | ORAL | Status: DC
  Filled 2017-09-20: qty 1

## 2017-09-20 MED ORDER — ACETAMINOPHEN 500 MG PO TABS
1000.0000 mg | ORAL_TABLET | ORAL | Status: AC
  Administered 2017-09-20: 1000 mg via ORAL
  Filled 2017-09-20: qty 2

## 2017-09-20 MED ORDER — LACTATED RINGERS IR SOLN
Status: DC | PRN
  Administered 2017-09-20: 1000 mL

## 2017-09-20 MED ORDER — FENTANYL CITRATE (PF) 100 MCG/2ML IJ SOLN: 25.0000 ug | INTRAMUSCULAR | Status: DC | PRN

## 2017-09-20 MED ORDER — DEXAMETHASONE SODIUM PHOSPHATE 10 MG/ML IJ SOLN
INTRAMUSCULAR | Status: DC | PRN
  Administered 2017-09-20: 10 mg via INTRAVENOUS

## 2017-09-20 MED ORDER — ROCURONIUM BROMIDE 10 MG/ML (PF) SYRINGE
PREFILLED_SYRINGE | INTRAVENOUS | Status: DC | PRN
  Administered 2017-09-20: 60 mg via INTRAVENOUS

## 2017-09-20 MED ORDER — OXYCODONE HCL 5 MG PO TABS: 5.0000 mg | ORAL_TABLET | ORAL | Status: DC | PRN

## 2017-09-20 MED ORDER — HYDROMORPHONE HCL 2 MG/ML IJ SOLN
INTRAMUSCULAR | Status: AC
  Filled 2017-09-20: qty 1

## 2017-09-20 MED ORDER — HYDROMORPHONE HCL 1 MG/ML IJ SOLN
0.2000 mg | INTRAMUSCULAR | Status: DC | PRN
  Administered 2017-09-20: 0.5 mg via INTRAVENOUS
  Administered 2017-09-21: 0.6 mg via INTRAVENOUS
  Filled 2017-09-20 (×2): qty 1

## 2017-09-20 MED ORDER — ACETAMINOPHEN 500 MG PO TABS
1000.0000 mg | ORAL_TABLET | Freq: Two times a day (BID) | ORAL | Status: DC
  Administered 2017-09-21: 1000 mg via ORAL
  Filled 2017-09-20 (×2): qty 2

## 2017-09-20 MED ORDER — ENOXAPARIN SODIUM 40 MG/0.4ML ~~LOC~~ SOLN
40.0000 mg | SUBCUTANEOUS | Status: AC
  Administered 2017-09-20: 40 mg via SUBCUTANEOUS
  Filled 2017-09-20: qty 0.4

## 2017-09-20 MED ORDER — TRAMADOL HCL 50 MG PO TABS: 100.0000 mg | ORAL_TABLET | Freq: Two times a day (BID) | ORAL | Status: DC | PRN

## 2017-09-20 MED ORDER — SUFENTANIL CITRATE 50 MCG/ML IV SOLN
INTRAVENOUS | Status: DC | PRN
  Administered 2017-09-20: 5 ug via INTRAVENOUS
  Administered 2017-09-20: 10 ug via INTRAVENOUS
  Administered 2017-09-20: 5 ug via INTRAVENOUS

## 2017-09-20 MED ORDER — PROMETHAZINE HCL 25 MG/ML IJ SOLN: 6.2500 mg | INTRAMUSCULAR | Status: DC | PRN

## 2017-09-20 MED ORDER — STERILE WATER FOR INJECTION IJ SOLN
INTRAMUSCULAR | Status: DC | PRN
  Administered 2017-09-20: 3 mL via SURGICAL_CAVITY

## 2017-09-20 MED ORDER — LACTATED RINGERS IV SOLN
INTRAVENOUS | Status: DC
  Administered 2017-09-20: 12:00:00 via INTRAVENOUS
  Administered 2017-09-20: 1000 mL via INTRAVENOUS
  Administered 2017-09-20: 09:00:00 via INTRAVENOUS

## 2017-09-20 MED ORDER — PHENYLEPHRINE HCL 10 MG/ML IJ SOLN
INTRAMUSCULAR | Status: DC | PRN
  Administered 2017-09-20 (×4): 80 ug via INTRAVENOUS

## 2017-09-20 MED ORDER — LIDOCAINE 2% (20 MG/ML) 5 ML SYRINGE
INTRAMUSCULAR | Status: DC | PRN
  Administered 2017-09-20: 50 mg via INTRAVENOUS

## 2017-09-20 MED ORDER — ONDANSETRON HCL 4 MG/2ML IJ SOLN
INTRAMUSCULAR | Status: DC | PRN
  Administered 2017-09-20: 4 mg via INTRAVENOUS

## 2017-09-20 MED ORDER — METOPROLOL TARTRATE 50 MG PO TABS
50.0000 mg | ORAL_TABLET | ORAL | Status: AC
  Administered 2017-09-20: 50 mg via ORAL
  Filled 2017-09-20: qty 1

## 2017-09-20 MED ORDER — HYDROMORPHONE HCL 1 MG/ML IJ SOLN
INTRAMUSCULAR | Status: DC | PRN
  Administered 2017-09-20 (×2): 0.5 mg via INTRAVENOUS

## 2017-09-20 MED ORDER — SUFENTANIL CITRATE 50 MCG/ML IV SOLN
INTRAVENOUS | Status: AC
  Filled 2017-09-20: qty 1

## 2017-09-20 MED ORDER — ENSURE ENLIVE PO LIQD
237.0000 mL | Freq: Two times a day (BID) | ORAL | Status: DC
  Administered 2017-09-21: 237 mL via ORAL

## 2017-09-20 MED ORDER — ONDANSETRON HCL 4 MG/2ML IJ SOLN
INTRAMUSCULAR | Status: AC
  Filled 2017-09-20: qty 2

## 2017-09-20 SURGICAL SUPPLY — 45 items
APPLICATOR SURGIFLO ENDO (HEMOSTASIS) IMPLANT
BAG LAPAROSCOPIC 12 15 PORT 16 (BASKET) IMPLANT
BAG RETRIEVAL 12/15 (BASKET)
CONT SPECI 4OZ STER CLIK (MISCELLANEOUS) IMPLANT
COVER BACK TABLE 60X90IN (DRAPES) ×2 IMPLANT
COVER TIP SHEARS 8 DVNC (MISCELLANEOUS) ×1 IMPLANT
COVER TIP SHEARS 8MM DA VINCI (MISCELLANEOUS) ×1
DERMABOND ADVANCED (GAUZE/BANDAGES/DRESSINGS) ×1
DERMABOND ADVANCED .7 DNX12 (GAUZE/BANDAGES/DRESSINGS) ×1 IMPLANT
DRAPE ARM DVNC X/XI (DISPOSABLE) ×4 IMPLANT
DRAPE COLUMN DVNC XI (DISPOSABLE) ×1 IMPLANT
DRAPE DA VINCI XI ARM (DISPOSABLE) ×4
DRAPE DA VINCI XI COLUMN (DISPOSABLE) ×1
DRAPE SHEET LG 3/4 BI-LAMINATE (DRAPES) ×2 IMPLANT
DRAPE SURG IRRIG POUCH 19X23 (DRAPES) ×2 IMPLANT
ELECT REM PT RETURN 15FT ADLT (MISCELLANEOUS) ×2 IMPLANT
GLOVE BIO SURGEON STRL SZ 6 (GLOVE) ×8 IMPLANT
GLOVE BIO SURGEON STRL SZ 6.5 (GLOVE) ×4 IMPLANT
GOWN STRL REUS W/ TWL LRG LVL3 (GOWN DISPOSABLE) ×2 IMPLANT
GOWN STRL REUS W/TWL LRG LVL3 (GOWN DISPOSABLE) ×2
HOLDER FOLEY CATH W/STRAP (MISCELLANEOUS) ×2 IMPLANT
IRRIG SUCT STRYKERFLOW 2 WTIP (MISCELLANEOUS) ×2
IRRIGATION SUCT STRKRFLW 2 WTP (MISCELLANEOUS) ×1 IMPLANT
KIT PROCEDURE DA VINCI SI (MISCELLANEOUS) ×1
KIT PROCEDURE DVNC SI (MISCELLANEOUS) ×1 IMPLANT
MANIPULATOR UTERINE 4.5 ZUMI (MISCELLANEOUS) ×2 IMPLANT
NEEDLE SPNL 18GX3.5 QUINCKE PK (NEEDLE) ×2 IMPLANT
OBTURATOR OPTICAL STANDARD 8MM (TROCAR) ×1
OBTURATOR OPTICAL STND 8 DVNC (TROCAR) ×1
OBTURATOR OPTICALSTD 8 DVNC (TROCAR) ×1 IMPLANT
PACK ROBOT GYN CUSTOM WL (TRAY / TRAY PROCEDURE) ×2 IMPLANT
PAD POSITIONING PINK XL (MISCELLANEOUS) ×2 IMPLANT
POUCH SPECIMEN RETRIEVAL 10MM (ENDOMECHANICALS) IMPLANT
SEAL CANN UNIV 5-8 DVNC XI (MISCELLANEOUS) ×4 IMPLANT
SEAL XI 5MM-8MM UNIVERSAL (MISCELLANEOUS) ×4
SET TRI-LUMEN FLTR TB AIRSEAL (TUBING) ×2 IMPLANT
SURGIFLO W/THROMBIN 8M KIT (HEMOSTASIS) IMPLANT
SUT VIC AB 0 CT1 27 (SUTURE)
SUT VIC AB 0 CT1 27XBRD ANTBC (SUTURE) IMPLANT
SYR 10ML LL (SYRINGE) ×2 IMPLANT
TOWEL OR NON WOVEN STRL DISP B (DISPOSABLE) ×2 IMPLANT
TRAP SPECIMEN MUCOUS 40CC (MISCELLANEOUS) IMPLANT
TRAY FOLEY W/METER SILVER 16FR (SET/KITS/TRAYS/PACK) ×2 IMPLANT
UNDERPAD 30X30 (UNDERPADS AND DIAPERS) ×2 IMPLANT
WATER STERILE IRR 1000ML POUR (IV SOLUTION) ×2 IMPLANT

## 2017-09-20 NOTE — Op Note (Signed)
OPERATIVE NOTE 09/20/17  Surgeon: Donaciano Eva   Assistants: Dr Lahoma Crocker (an MD assistant was necessary for tissue manipulation, management of robotic instrumentation, retraction and positioning due to the complexity of the case and hospital policies).   Anesthesia: General endotracheal anesthesia  ASA Class: 3   Pre-operative Diagnosis: endometrial cancer grade 1  Post-operative Diagnosis: same,   Operation: Robotic-assisted laparoscopic total hysterectomy with bilateral salpingoophorectomy, SLN biopsy   Surgeon: Donaciano Eva  Assistant Surgeon: Lahoma Crocker MD  Anesthesia: GET  Urine Output: 100  Operative Findings:  : 6cm normal appearing uterus, normal tubes and ovaries, normal abdomen, no suspicious nodes.  Estimated Blood Loss:  25cc      Total IV Fluids: 800 ml         Specimens: uterus, cervix, bilateral tubes and ovaries, left and right external iliac SLN         Complications:  None; patient tolerated the procedure well.         Disposition: PACU - hemodynamically stable.  Procedure Details  The patient was seen in the Holding Room. The risks, benefits, complications, treatment options, and expected outcomes were discussed with the patient.  The patient concurred with the proposed plan, giving informed consent.  The site of surgery properly noted/marked. The patient was identified as Charlotte Castro and the procedure verified as a Robotic-assisted hysterectomy with bilateral salpingo oophorectomy with SLN biopsy. A Time Out was held and the above information confirmed.  After induction of anesthesia, the patient was draped and prepped in the usual sterile manner. Pt was placed in supine position after anesthesia and draped and prepped in the usual sterile manner. The abdominal drape was placed after the CholoraPrep had been allowed to dry for 3 minutes.  Her arms were tucked to her side with all appropriate precautions.  The shoulders were  stabilized with padded shoulder blocks applied to the acromium processes.  The patient was placed in the semi-lithotomy position in [place].  The perineum was prepped with Betadine. The patient was then prepped. Foley catheter was placed.  A sterile speculum was placed in the vagina.  The cervix was grasped with a single-tooth tenaculum. 2mg  total of ICG was injected into the cervical stroma at 2 and 9 o'clock with 1cc injected at a 1cm and 40mm depth (concentration 0.5mg /ml) in all locations. The cervix was dilated with Kennon Rounds dilators.  The ZUMI uterine manipulator with a medium colpotomizer ring was placed without difficulty.  A pneum occluder balloon was placed over the manipulator.  OG tube placement was confirmed and to suction.   Next, a 5 mm skin incision was made 1 cm below the subcostal margin in the midclavicular line.  The 5 mm Optiview port and scope was used for direct entry.  Opening pressure was under 10 mm CO2.  The abdomen was insufflated and the findings were noted as above.   At this point and all points during the procedure, the patient's intra-abdominal pressure did not exceed 15 mmHg. Next, a 10 mm skin incision was made in the umbilicus and a right and left port was placed about 10 cm lateral to the robot port on the right and left side.  A fourth arm was placed in the left lower quadrant 2 cm above and superior and medial to the anterior superior iliac spine.  All ports were placed under direct visualization.  The patient was placed in steep Trendelenburg.  Bowel was folded away into the upper abdomen.  The robot was docked in the normal manner.  The right and left peritoneum were opened parallel to the IP ligament to open the retroperitoneal spaces bilaterally. The SLN mapping was performed in bilateral pelvic basins. The para rectal and paravesical spaces were opened up entirely with careful dissection below the level of the ureters bilaterally and to the depth of the uterine  artery origin in order to skeletonize the uterine "web" and ensure visualization of all parametrial channels. The para-aortic basins were carefully exposed and evaluated for isolated para-aortic SLN's. Lymphatic channels were identified travelling to the following visualized sentinel lymph node's: left and right external iliac SLNs. These SLN's were separated from their surrounding lymphatic tissue, removed and sent for permanent pathology.  The hysterectomy was started after the round ligament on the right side was incised and the retroperitoneum was entered and the pararectal space was developed.  The ureter was noted to be on the medial leaf of the broad ligament.  The peritoneum above the ureter was incised and stretched and the infundibulopelvic ligament was skeletonized, cauterized and cut.  The posterior peritoneum was taken down to the level of the KOH ring.  The anterior peritoneum was also taken down.  The bladder flap was created to the level of the KOH ring.  The uterine artery on the right side was skeletonized, cauterized and cut in the normal manner.  A similar procedure was performed on the left.  The colpotomy was made and the uterus, cervix, bilateral ovaries and tubes were amputated and delivered through the vagina.  Pedicles were inspected and excellent hemostasis was achieved.    The colpotomy at the vaginal cuff was closed with Vicryl on a CT1 needle in a running manner.  Irrigation was used and excellent hemostasis was achieved.  At this point in the procedure was completed.  Robotic instruments were removed under direct visulaization.  The robot was undocked. The 10 mm ports were closed with Vicryl on a UR-5 needle and the fascia was closed with 0 Vicryl on a UR-5 needle.  The skin was closed with 4-0 Vicryl in a subcuticular manner.  Dermabond was applied.  Sponge, lap and needle counts correct x 2.  The patient was taken to the recovery room in stable condition.  The vagina was  swabbed with a small opening of the mucosa of the vaginal introitus, minimal bleeding noted.   All instrument and needle counts were correct x  3.   The patient was transferred to the recovery room in a stable condition.  Donaciano Eva, MD

## 2017-09-20 NOTE — Anesthesia Preprocedure Evaluation (Signed)
Anesthesia Evaluation  Patient identified by MRN, date of birth, ID band Patient awake    Reviewed: Allergy & Precautions, NPO status , Patient's Chart, lab work & pertinent test results, reviewed documented beta blocker date and time   Airway Mallampati: II  TM Distance: >3 FB Neck ROM: Full    Dental  (+) Edentulous Upper, Missing, Poor Dentition, Chipped,  Chipped:   Pulmonary shortness of breath,    Pulmonary exam normal breath sounds clear to auscultation       Cardiovascular hypertension, Pt. on medications and Pt. on home beta blockers + Peripheral Vascular Disease and +CHF  + dysrhythmias Atrial Fibrillation  Rhythm:Regular Rate:Normal     Neuro/Psych  Headaches, PSYCHIATRIC DISORDERS Anxiety    GI/Hepatic Neg liver ROS, hiatal hernia, GERD  Medicated and Controlled,  Endo/Other  Hypothyroidism   Renal/GU negative Renal ROS  negative genitourinary   Musculoskeletal  (+) Arthritis , Osteoarthritis,  Ulcer left middle toe   Abdominal Normal abdominal exam  (+) + obese,   Peds  Hematology  (+) anemia , Coumadin use- therpeutic, last dose 0400   Anesthesia Other Findings   Reproductive/Obstetrics Post Menopausal Bleeding Thickened endometrium                             Anesthesia Physical  Anesthesia Plan  ASA: III  Anesthesia Plan: General   Post-op Pain Management:    Induction: Intravenous  PONV Risk Score and Plan: 4 or greater and Propofol infusion, Ondansetron, Treatment may vary due to age or medical condition and Dexamethasone  Airway Management Planned: Oral ETT  Additional Equipment:   Intra-op Plan:   Post-operative Plan: Extubation in OR  Informed Consent: I have reviewed the patients History and Physical, chart, labs and discussed the procedure including the risks, benefits and alternatives for the proposed anesthesia with the patient or authorized  representative who has indicated his/her understanding and acceptance.   Dental advisory given  Plan Discussed with: CRNA  Anesthesia Plan Comments:         Anesthesia Quick Evaluation

## 2017-09-20 NOTE — Transfer of Care (Signed)
Immediate Anesthesia Transfer of Care Note  Patient: CAILEEN VERACRUZ  Procedure(s) Performed: XI ROBOTIC ASSISTED TOTAL HYSTERECTOMY WITH BILATERAL SALPINGO OOPHORECTOMY, SENTINAL LYMPH NODE BIOPSY (Bilateral Abdomen)  Patient Location: PACU  Anesthesia Type:General  Level of Consciousness: awake, alert  and patient cooperative  Airway & Oxygen Therapy: Patient Spontanous Breathing and Patient connected to face mask oxygen  Post-op Assessment: Report given to RN, Post -op Vital signs reviewed and stable and Patient moving all extremities X 4  Post vital signs: stable  Last Vitals:  Vitals Value Taken Time  BP 146/88 09/20/2017 12:34 PM  Temp    Pulse 84 09/20/2017 12:38 PM  Resp 16 09/20/2017 12:38 PM  SpO2 100 % 09/20/2017 12:38 PM  Vitals shown include unvalidated device data.  Last Pain:  Vitals:   09/20/17 0809  TempSrc: Oral  PainSc: 0-No pain         Complications: No apparent anesthesia complications

## 2017-09-20 NOTE — Anesthesia Procedure Notes (Signed)
Procedure Name: Intubation Date/Time: 09/20/2017 10:59 AM Performed by: Lissa Morales, CRNA Pre-anesthesia Checklist: Patient identified, Emergency Drugs available, Suction available and Patient being monitored Patient Re-evaluated:Patient Re-evaluated prior to induction Oxygen Delivery Method: Circle system utilized Preoxygenation: Pre-oxygenation with 100% oxygen Induction Type: IV induction Ventilation: Mask ventilation without difficulty Laryngoscope Size: Miller and 2 Grade View: Grade I Tube type: Oral (Intubated by Donita Brooks EMT student) Tube size: 7.5 mm Number of attempts: 1 Airway Equipment and Method: Stylet and Oral airway Placement Confirmation: ETT inserted through vocal cords under direct vision,  positive ETCO2 and breath sounds checked- equal and bilateral Secured at: 21 cm Tube secured with: Tape Dental Injury: Teeth and Oropharynx as per pre-operative assessment

## 2017-09-20 NOTE — Anesthesia Postprocedure Evaluation (Signed)
Anesthesia Post Note  Patient: Charlotte Castro  Procedure(s) Performed: XI ROBOTIC ASSISTED TOTAL HYSTERECTOMY WITH BILATERAL SALPINGO OOPHORECTOMY, SENTINAL LYMPH NODE BIOPSY (Bilateral Abdomen)     Patient location during evaluation: PACU Anesthesia Type: General Level of consciousness: sedated and patient cooperative Pain management: pain level controlled Vital Signs Assessment: post-procedure vital signs reviewed and stable Respiratory status: spontaneous breathing Cardiovascular status: stable Anesthetic complications: no    Last Vitals:  Vitals:   09/20/17 1450 09/20/17 1600  BP: (!) 142/85 (!) 154/94  Pulse: 89 91  Resp: 16 18  Temp: 36.4 C 36.5 C  SpO2: 100% 100%    Last Pain:  Vitals:   09/20/17 1600  TempSrc: Oral  PainSc:                  Nolon Nations

## 2017-09-20 NOTE — H&P (Signed)
CC:     Chief Complaint  Patient presents with  . Endometrial ca [facility])    Assessment/Plan:  Charlotte. Charlotte Castro  is a 84 y.o.  year old with grade I endometrioid endometrioid adenocarcinoma. She has a history of atrial fibrillation on xarelto.  She has a history of CHF but a normal stress echo in December, 2018 with EF of 68% and has good functional status.  A detailed discussion was held with the patient and her family with regard to to her endometrial cancer diagnosis. We discussed the standard management options for uterine cancer which includes surgery followed possibly by adjuvant therapy depending on the results of surgery. The options for surgical management include a hysterectomy and removal of the tubes and ovaries possibly with removal of pelvic and para-aortic lymph nodes.If feasible, a minimally invasive approach including a robotic hysterectomy or laparoscopic hysterectomy have benefits including shorter hospital stay, recovery time and better wound healing than with open surgery. The patient has been counseled about these surgical options and the risks of surgery in general including infection, bleeding, damage to surrounding structures (including bowel, bladder, ureters, nerves or vessels), and the postoperative risks of PE/ DVT, and lymphedema. I extensively reviewed the additional risks of robotic hysterectomy including possible need for conversion to open laparotomy.  I discussed positioning during surgery of trendelenberg and risks of minor facial swelling and care we take in preoperative positioning.  After counseling and consideration of her options, she desires to proceed with robotic assisted total hysterectomy with bilateral sapingo-oophorectomy and SLN biopsy, possible lymphadenectomy. She understands that lymphadenectomy will be performed based on the need for her to undergo this procedure based on uterine pathology and her tolerance of general anesthesia.  I discussed  that there is a medical alternative to surgery, that being continued progestin therapy. It would avoid the surgical risks, however is typically less definitive. The patient strenuously desired to proceed with surgery as a definitive option. Her children were present and in support of her decision.  Due to her general frailty, she is recommended to be discharged home with somebody at home.  I mentioned this to her children so that they can coordinate this care.  I do not think that she will qualify for postoperative rehab placement because unlike her prior surgery in 2018, she is not unwell with an underlying illness (at that time she had gallstone pancreatitis).  Therefore I expect she will recover fairly quickly after surgery.  Having said that given her age and underlying medical conditions, she is at increased risk of perioperative morbidity and will likely need some assistance with activities of daily living before living with a family member or friend is ideal in the first week or 2 after surgery.  She has increased surgical risks of bleeding due to her Coumadin use.  We checked her INR level today and is normal at 1.08.  We have held Coumadin for 5 days preoperatively.  As its indication is for atrial fibrillation we will not perform bridging with Lovenox in order to minimize perioperative bleeding risk.  Even with these precautions I explained that perioperative bleeding risk is still elevated in a patient who has been on recent anticoagulation.  Additionally coming off anticoagulation places her at increased risk for perioperative stroke.  I discussed the delicate balance between risks that present in this situation.   She will be seen by anesthesia for preoperative clearance and discussion of postoperative pain management.  She was given the opportunity to  ask questions, which were answered to her satisfaction, and she is agreement with the above mentioned plan of care.  HPI: Charlotte Castro is an 84  year old parous woman who is seen in consultation at the request of Dr Harolyn Rutherford for grade 1 endometrial cancer.  The patient reports that 2-3-monthhistory of vaginal bleeding which is light.  She was taken to the operating room on 520-second 2019 [facility] a FIGO grade 1 endometrial adenocarcinoma.  Prior to surgery she had undergone an ultrasound scan which revealed a uterus measuring 5.6 x 3.2 x 3.3 cm, with a thickened endometrium of 12 mm.  The right and left ovary were not visualized.  There is no abnormal free fluid.  She was prescribed megace therapy.   The patient's past medical history is significant for the below stated conditions.  In particular she has atrial fibrillation and atrial flutter for which she is on multiple cardiac medications and anticoagulation with Coumadin therapeutic to 2.5.  She also has had a recent history [facility]  She had laparoscopic cholecystectomy and a total 7-day hospital stay followed by 21-day rehab stay for general deconditioning.  The patient was discharged to home where she lives alone.  Other past medical history of significance is gastritis.  She has gastroesophageal reflux disease and chronic nausea which is unexplained.  She has been seen by gastroenterologist for this recently however has not had an EGD.  No imaging has revealed evidence of an etiology for her persistent nausea.  She has a history of lumbar spine surgery shoulder surgery and fracture surgery.  She has a history of a cardiac catheterization in 2012.  She also had atrial ablation surgery in 2012.  With respect to cardiac workup [facility]  Her most recent cardiac echo was in December 2018 which was a stress echo and revealed an ejection fraction of 68%.   Current Meds:      Outpatient Encounter Medications as of 08/19/2017   Medication Sig  . acetaminophen (TYLENOL) 325 MG tablet Take 325 mg by mouth every 4 (four) hours as needed for headache (pain). Reported on 10/04/2015  . Cyanocobalamin 1000 MCG/ML KIT Inject 1,000 mcg as directed every 30 (thirty) days.  .Marland Kitchendocusate sodium (COLACE) 100 MG capsule Take 1 capsule (100 mg total) by mouth 2 (two) times daily as needed for mild constipation or moderate constipation.  .Marland Kitchenlevothyroxine (SYNTHROID, LEVOTHROID) 88 MCG tablet Take 1 tablet (88 mcg total) by mouth daily before breakfast.  . megestrol (MEGACE) 40 MG tablet Take 2 tablets (80 mg total) by mouth 2 (two) times daily.  . metoprolol (LOPRESSOR) 50 MG tablet Take 1 tablet (50 mg total) by mouth 2 (two) times daily.  .Marland Kitchenwarfarin (COUMADIN) 5 MG tablet TAKE 1/2 TO 1 TABLET DAILY AS DIRECTED BY COUMADIN CLINIC  . [DISCONTINUED] megestrol (MEGACE) 40 MG tablet Take 2 tablets (80 mg total) by mouth 3 (three) times daily.  . famotidine (PEPCID) 20 MG tablet Take 1 tablet (20 mg total) by mouth 2 (two) times daily.  .Marland KitchenoxyCODONE-acetaminophen (PERCOCET/ROXICET) 5-325 MG tablet Take 1 tablet by mouth every 4 (four) hours as needed for severe pain. (Patient not taking: Reported on 08/19/2017)  . [DISCONTINUED] gentamicin cream (GARAMYCIN) 0.1 % Apply 1 application topically 3 (three) times daily.   No facility-administered encounter medications on file as of 08/19/2017.  Allergy:  Allergies  Allergen Reactions  . Iodinated Diagnostic Agents Shortness Of Breath    headache  . Penicillins Other (See Comments)    Tolerated Zosyn Oct 2018    Social Hx:   Social History        Socioeconomic History  . Marital status: Widowed    Spouse name: Not on file  . Number of children: 2  . Years of education: 20  . Highest education level: Not on file  Social Needs  . Financial resource strain: Not on file  . Food insecurity - worry: Not on file  . Food insecurity - inability: Not on file  . Transportation  needs - medical: Not on file  . Transportation needs - non-medical: Not on file  Occupational History  . Not on file  Tobacco Use  . Smoking status: Never Smoker  . Smokeless tobacco: Never Used  Substance and Sexual Activity  . Alcohol use: No  . Drug use: No  . Sexual activity: No  Other Topics Concern  . Not on file  Social History Narrative   Lives alone in a one story home.  Has 2 children.  Retired.  Education: high school.      Past Surgical Hx:       Past Surgical History:  Procedure Laterality Date  . ATRIAL ABLATION SURGERY  11/17/10   Atrial tachycardia arising from [facility] of the aorta ablated by JA  . BACK SURGERY    . CARDIAC CATHETERIZATION  01/11/2011   Archie Endo 01/12/2011  . CHOLECYSTECTOMY N/A 04/08/2017   Procedure: LAPAROSCOPIC CHOLECYSTECTOMY;  Surgeon: Georganna Skeans, MD;  Location: [facility];  Service: General;  Laterality: N/A;  . FRACTURE SURGERY    . HYSTEROSCOPY W/D&C N/A 08/05/2017   Procedure: DILATATION AND CURETTAGE /HYSTEROSCOPY AND POLYPECTOMY;  Surgeon: Osborne Oman, MD;  Location: [facility];  Service: Gynecology;  Laterality: N/A;  . LUMBAR SPINE SURGERY  ~ 1998  . SHOULDER SURGERY Right 1984 X2   Rollene Rotunda 01/19/2011  . WRIST FRACTURE SURGERY Left 1983    Past Medical Hx:      Past Medical History:  Diagnosis Date  . Anxiety   . Arthritis    "left knee" (04/05/2017)  . Atrial fibrillation ([place])   . Atrial flutter ([place])    typical appearing  . Atrial tachycardia ([place])    ablated 11/17/10  by JA  from [facility] of the aorta  . Chronic lower back pain   . Dizziness    chronic and of an unclear etiology  . Gastritis   . GERD (gastroesophageal reflux disease)   . History of blood transfusion ~ 1948  . HTN (hypertension)   . Hyperlipemia   . Hypothyroidism   . Obesity   . Osteoporosis   . Sinus headache   . Vitamin D deficiency     Past Gynecological History:  SVD x 2 No LMP recorded. Patient is  postmenopausal.  Family Hx:       Family History  Problem Relation Age of Onset  . Heart attack Father   . Hypertension Father     Review of Systems:  Constitutional  Feels fatigued   ENT Normal appearing ears and nares bilaterally Skin/Breast  No rash, sores, jaundice, itching, dryness Cardiovascular  No chest pain, shortness of breath, or edema  Pulmonary  No cough or wheeze.  Gastro Intestinal  No vomitting, or diarrhoea. No bright red blood per rectum, change in bowel movement, or constipation. + cramping abdominal pain. +  nausea. Genito Urinary  No frequency, urgency, dysuria, + vaginal bleeding Musculo Skeletal  No myalgia, arthralgia, joint swelling or pain  Neurologic  No weakness, numbness, change in gait,  Psychology  No depression, anxiety, insomnia.   Vitals:  Blood pressure (!) 162/95, pulse 93, temperature 97.8 F (36.6 C), temperature source Oral, resp. rate 18, height 5' 8"  (1.727 m), weight 198 lb 8 oz (90 kg), SpO2 100 %.  Physical Exam: WD in NAD Neck  Supple NROM, without any enlargements.  Lymph Node Survey No cervical supraclavicular or inguinal adenopathy Cardiovascular  Pulse normal rate, regularity and rhythm. S1 and S2 normal.  Lungs  Clear to auscultation bilateraly, without wheezes/crackles/rhonchi. Good air movement.  Skin  No rash/lesions/breakdown  Psychiatry  Alert and oriented to person, place, and time  Abdomen  Normoactive bowel sounds, abdomen soft, non-tender and nonobese without evidence of hernia.  Back No CVA tenderness Genito Urinary  Vulva/vagina: Normal external female genitalia.   No lesions. No discharge or bleeding.             Bladder/urethra:  No lesions or masses, well supported bladder             Vagina: narrow and atrophic, but grossly normal             Cervix: Normal appearing, no lesions.             Uterus:  Small, mobile, no parametrial involvement or nodularity.             Adnexa: no  palpable masses. Rectal  deferred Extremities  No bilateral cyanosis, clubbing or edema.  Thereasa Solo, MD

## 2017-09-21 DIAGNOSIS — C541 Malignant neoplasm of endometrium: Secondary | ICD-10-CM | POA: Diagnosis not present

## 2017-09-21 LAB — BASIC METABOLIC PANEL
Anion gap: 8 (ref 5–15)
BUN: 23 mg/dL — ABNORMAL HIGH (ref 6–20)
CHLORIDE: 110 mmol/L (ref 101–111)
CO2: 20 mmol/L — AB (ref 22–32)
Calcium: 8.8 mg/dL — ABNORMAL LOW (ref 8.9–10.3)
Creatinine, Ser: 1 mg/dL (ref 0.44–1.00)
GFR calc non Af Amer: 51 mL/min — ABNORMAL LOW (ref >60)
GFR, EST AFRICAN AMERICAN: 59 mL/min — AB (ref >60)
GLUCOSE: 136 mg/dL — AB (ref 65–99)
Potassium: 4.3 mmol/L (ref 3.5–5.1)
Sodium: 138 mmol/L (ref 135–145)

## 2017-09-21 LAB — CBC
HEMATOCRIT: 35.8 % — AB (ref 36.0–46.0)
HEMOGLOBIN: 12.6 g/dL (ref 12.0–15.0)
MCH: 32.6 pg (ref 26.0–34.0)
MCHC: 35.2 g/dL (ref 30.0–36.0)
MCV: 92.5 fL (ref 78.0–100.0)
Platelets: 192 10*3/uL (ref 150–400)
RBC: 3.87 MIL/uL (ref 3.87–5.11)
RDW: 13.3 % (ref 11.5–15.5)
WBC: 12.2 10*3/uL — ABNORMAL HIGH (ref 4.0–10.5)

## 2017-09-21 MED ORDER — SENNOSIDES-DOCUSATE SODIUM 8.6-50 MG PO TABS: 1.0000 | ORAL_TABLET | Freq: Every day | ORAL | 0 refills | Status: DC

## 2017-09-21 MED ORDER — WARFARIN SODIUM 5 MG PO TABS: ORAL_TABLET | ORAL | 2 refills | Status: DC

## 2017-09-21 MED ORDER — OXYCODONE HCL 5 MG PO TABS: 5.0000 mg | ORAL_TABLET | ORAL | 0 refills | Status: DC | PRN

## 2017-09-21 NOTE — Discharge Instructions (Addendum)
09/20/2017  Return to work: in 4 weeks  Activity: 1. Be up and out of the bed during the day.  Take a nap if needed.  You may walk up steps but be careful and use the hand rail.  Stair climbing will tire you more than you think, you may need to stop part way and rest.   2. No lifting or straining for 6 weeks.  3. No driving for 1 weeks.  Do Not drive if you are taking narcotic pain medicine.  4. Shower daily.  Use soap and water on your incision and pat dry; don't rub.   5. No sexual activity and nothing in the vagina for 8 weeks.  6. Resume Coumadin on Sunday, April 14.  Medications:  - Take ibuprofen and tylenol first line for pain control. Take these regularly (every 6 hours) to decrease the build up of pain.  - If necessary, for severe pain not relieved by ibuprofen, take oxycodone.  - While taking oxycodone you should take sennakot every night to reduce the likelihood of constipation. If this causes diarrhea, stop its use.  Diet: 1. Low sodium Heart Healthy Diet is recommended.  2. It is safe to use a laxative if you have difficulty moving your bowels.   Wound Care: 1. Keep clean and dry.  Shower daily.  Reasons to call the Doctor:   Fever - Oral temperature greater than 100.4 degrees Fahrenheit  Foul-smelling vaginal discharge  Difficulty urinating  Nausea and vomiting  Increased pain at the site of the incision that is unrelieved with pain medicine.  Difficulty breathing with or without chest pain  New calf pain especially if only on one side  Sudden, continuing increased vaginal bleeding with or without clots.   Follow-up: 1. See Everitt Amber in 3 weeks.  Contacts: For questions or concerns you should contact:  Dr. Everitt Amber at [phone] After hours and on week-ends call [phone] and ask to speak to the physician on call for Gynecologic Oncology   Oxycodone tablets or capsules What is this medicine? OXYCODONE (ox i KOE done) is a pain  reliever. It is used to treat moderate to severe pain. This medicine may be used for other purposes; ask your health care provider or pharmacist if you have questions. COMMON BRAND NAME(S): Dazidox, Endocodone, Oxaydo, OXECTA, OxyIR, Percolone, Roxicodone, ROXYBOND What should I tell my health care provider before I take this medicine? They need to know if you have any of these conditions: -Addison's disease -brain tumor -head injury -heart disease -history of drug or alcohol abuse problem -if you often drink alcohol -kidney disease -liver disease -lung or breathing disease, like asthma -mental illness -pancreatic disease -seizures -thyroid disease -an unusual or allergic reaction to oxycodone, codeine, hydrocodone, morphine, other medicines, foods, dyes, or preservatives -pregnant or trying to get pregnant -breast-feeding How should I use this medicine? Take this medicine by mouth with a glass of water. Follow the directions on the prescription label. You can take it with or without food. If it upsets your stomach, take it with food. Take your medicine at regular intervals. Do not take it more often than directed. Do not stop taking except on your doctor's advice. Some brands of this medicine, like Oxecta, have special instructions. Ask your doctor or pharmacist if these directions are for you: Do not cut, crush or chew this medicine. Swallow only one tablet at a time. Do not wet, soak, or lick the tablet before you take it. A  special MedGuide will be given to you by the pharmacist with each prescription and refill. Be sure to read this information carefully each time. Talk to your pediatrician regarding the use of this medicine in children. Special care may be needed. Overdosage: If you think you have taken too much of this medicine contact a poison control center or emergency room at once. NOTE: This medicine is only for you. Do not share this medicine with others. What if I miss a  dose? If you miss a dose, take it as soon as you can. If it is almost time for your next dose, take only that dose. Do not take double or extra doses. What may interact with this medicine? This medicine may interact with the following medications: -alcohol -antihistamines for allergy, cough and cold -antiviral medicines for HIV or AIDS -atropine -certain antibiotics like clarithromycin, erythromycin, linezolid, rifampin -certain medicines for anxiety or sleep -certain medicines for bladder problems like oxybutynin, tolterodine -certain medicines for depression like amitriptyline, fluoxetine, sertraline -certain medicines for fungal infections like ketoconazole, itraconazole, voriconazole -certain medicines for migraine headache like almotriptan, eletriptan, frovatriptan, naratriptan, rizatriptan, sumatriptan, zolmitriptan -certain medicines for nausea or vomiting like dolasetron, ondansetron, palonosetron -certain medicines for Parkinson's disease like benztropine, trihexyphenidyl -certain medicines for seizures like phenobarbital, phenytoin, primidone -certain medicines for stomach problems like dicyclomine, hyoscyamine -certain medicines for travel sickness like scopolamine -diuretics -general anesthetics like halothane, isoflurane, methoxyflurane, propofol -ipratropium -local anesthetics like lidocaine, pramoxine, tetracaine -MAOIs like Carbex, Eldepryl, Marplan, Nardil, and Parnate -medicines that relax muscles for surgery -methylene blue -nilotinib -other narcotic medicines for pain or cough -phenothiazines like chlorpromazine, mesoridazine, prochlorperazine, thioridazine This list may not describe all possible interactions. Give your health care provider a list of all the medicines, herbs, non-prescription drugs, or dietary supplements you use. Also tell them if you smoke, drink alcohol, or use illegal drugs. Some items may interact with your medicine. What should I watch for  while using this medicine? Tell your doctor or health care professional if your pain does not go away, if it gets worse, or if you have new or a different type of pain. You may develop tolerance to the medicine. Tolerance means that you will need a higher dose of the medicine for pain relief. Tolerance is normal and is expected if you take this medicine for a long time. Do not suddenly stop taking your medicine because you may develop a severe reaction. Your body becomes used to the medicine. This does NOT mean you are addicted. Addiction is a behavior related to getting and using a drug for a non-medical reason. If you have pain, you have a medical reason to take pain medicine. Your doctor will tell you how much medicine to take. If your doctor wants you to stop the medicine, the dose will be slowly lowered over time to avoid any side effects. There are different types of narcotic medicines (opiates). If you take more than one type at the same time or if you are taking another medicine that also causes drowsiness, you may have more side effects. Give your health care provider a list of all medicines you use. Your doctor will tell you how much medicine to take. Do not take more medicine than directed. Call emergency for help if you have problems breathing or unusual sleepiness. You may get drowsy or dizzy. Do not drive, use machinery, or do anything that needs mental alertness until you know how the medicine affects you. Do not stand or sit up  quickly, especially if you are an older patient. This reduces the risk of dizzy or fainting spells. Alcohol may interfere with the effect of this medicine. Avoid alcoholic drinks. This medicine will cause constipation. Try to have a bowel movement at least every 2 to 3 days. If you do not have a bowel movement for 3 days, call your doctor or health care professional. Your mouth may get dry. Chewing sugarless gum or sucking hard candy, and drinking plenty of water may  help. Contact your doctor if the problem does not go away or is severe. What side effects may I notice from receiving this medicine? Side effects that you should report to your doctor or health care professional as soon as possible: -allergic reactions like skin rash, itching or hives, swelling of the face, lips, or tongue -breathing problems -confusion -signs and symptoms of low blood pressure like dizziness; feeling faint or lightheaded, falls; unusually weak or tired -trouble passing urine or change in the amount of urine -trouble swallowing Side effects that usually do not require medical attention (report to your doctor or health care professional if they continue or are bothersome): -constipation -dry mouth -nausea, vomiting -tiredness This list may not describe all possible side effects. Call your doctor for medical advice about side effects. You may report side effects to FDA at 1-800-FDA-1088. Where should I keep my medicine? Keep out of the reach of children. This medicine can be abused. Keep your medicine in a safe place to protect it from theft. Do not share this medicine with anyone. Selling or giving away this medicine is dangerous and against the law. Store at room temperature between 15 and 30 degrees C (59 and 86 degrees F). Protect from light. Keep container tightly closed. This medicine may cause accidental overdose and death if it is taken by other adults, children, or pets. Flush any unused medicine down the toilet to reduce the chance of harm. Do not use the medicine after the expiration date. NOTE: This sheet is a summary. It may not cover all possible information. If you have questions about this medicine, talk to your doctor, pharmacist, or health care provider.  2018 Elsevier/Gold Standard (2015-04-15 16:55:57)

## 2017-09-21 NOTE — Discharge Summary (Signed)
Physician Discharge Summary  Patient ID: Charlotte Castro MRN: [id number] DOB/AGE: 21-Oct-1932 84 y.o.  Admit date: 09/20/2017 Discharge date: 09/21/2017  Admission Diagnoses: Endometrial cancer [facility])  Discharge Diagnoses:  Principal Problem:   Endometrial cancer [facility])   Discharged Condition:  The patient is in good condition and stable for discharge.    Hospital Course: On 09/20/2017, the patient underwent the following: Procedure(s): XI ROBOTIC ASSISTED TOTAL HYSTERECTOMY WITH BILATERAL SALPINGO OOPHORECTOMY, SENTINAL LYMPH NODE BIOPSY.   The postoperative course was uneventful.  She was discharged to home on postoperative day 1 tolerating a regular diet, ambulating, pain controlled. She is to resume her Coumadin 5 days after surgery.  Consults: None  Significant Diagnostic Studies: None  Treatments: surgery: see above  Discharge Exam: Blood pressure (!) 157/76, pulse 90, temperature 98.5 F (36.9 C), temperature source Oral, resp. rate 18, height 5' 8"  (1.727 m), weight 199 lb (90.3 kg), SpO2 100 %. General appearance: alert, cooperative and no distress Resp: clear to auscultation bilaterally Cardio: regular rate and rhythm, S1, S2 normal, no murmur, click, rub or gallop GI: soft, non-tender; bowel sounds normal; no masses,  no organomegaly Extremities: extremities normal, atraumatic, no cyanosis or edema Incision/Wound: Lap sites to the abdomen with dermabond with no drainage  Disposition: Discharge disposition: 01-Home or Self Care       Discharge Instructions    Call MD for:  difficulty breathing, headache or visual disturbances   Complete by:  As directed    Call MD for:  extreme fatigue   Complete by:  As directed    Call MD for:  hives   Complete by:  As directed    Call MD for:  persistant dizziness or light-headedness   Complete by:  As directed    Call MD for:  persistant nausea and vomiting   Complete by:  As directed    Call MD for:  redness, tenderness, or  signs of infection (pain, swelling, redness, odor or green/yellow discharge around incision site)   Complete by:  As directed    Call MD for:  severe uncontrolled pain   Complete by:  As directed    Call MD for:  temperature >100.4   Complete by:  As directed    Diet - low sodium heart healthy   Complete by:  As directed    Driving Restrictions   Complete by:  As directed    No driving for 1 week if you were cleared to drive before surgery.  Do not take narcotics and drive.   Increase activity slowly   Complete by:  As directed    Lifting restrictions   Complete by:  As directed    No lifting greater than 10 lbs.   Sexual Activity Restrictions   Complete by:  As directed    No sexual activity, nothing in the vagina, for 8 weeks.     Allergies as of 09/21/2017      Reactions   Iodinated Diagnostic Agents Shortness Of Breath   headache   Penicillins Other (See Comments)   Tolerated Zosyn Oct 2018      Medication List    STOP taking these medications   megestrol 40 MG tablet Commonly known as:  MEGACE   oxyCODONE-acetaminophen 5-325 MG tablet Commonly known as:  PERCOCET/ROXICET     TAKE these medications   Cyanocobalamin 1000 MCG/ML Kit Inject 1,000 mcg as directed every 30 (thirty) days.   docusate sodium 100 MG capsule Commonly known as:  COLACE Take  1 capsule (100 mg total) by mouth 2 (two) times daily as needed for mild constipation or moderate constipation.   famotidine 20 MG tablet Commonly known as:  PEPCID Take 1 tablet (20 mg total) by mouth 2 (two) times daily.   gentamicin cream 0.1 % Commonly known as:  GARAMYCIN Apply 1 application topically 3 (three) times daily as needed (applied to affected area of toe.).   levothyroxine 88 MCG tablet Commonly known as:  SYNTHROID, LEVOTHROID Take 1 tablet (88 mcg total) by mouth daily before breakfast.   metoprolol tartrate 50 MG tablet Commonly known as:  LOPRESSOR Take 1 tablet (50 mg total) by mouth 2  (two) times daily.   oxyCODONE 5 MG immediate release tablet Commonly known as:  Oxy IR/ROXICODONE Take 1 tablet (5 mg total) by mouth every 4 (four) hours as needed for severe pain.   senna-docusate 8.6-50 MG tablet Commonly known as:  Senokot-S Take 1 tablet by mouth at bedtime.   warfarin 5 MG tablet Commonly known as:  COUMADIN Take as directed. If you are unsure how to take this medication, talk to your nurse or doctor. Original instructions:  Resume Sunday, April 14. Take 1/2 tab to 1 tab daily or as directed by Coumadin Clinic What changed:  additional instructions      Follow-up Information    Everitt Amber, MD In 3 weeks.   Specialty:  Obstetrics and Gynecology Contact information: [address] [place] [address] [phone]           Greater than thirty minutes were spend for face to face discharge instructions and discharge orders/summary in EPIC.   Signed: Dorothyann Gibbs 09/21/2017, 9:29 AM

## 2017-09-21 NOTE — Progress Notes (Signed)
Pt was discharged home today. Instructions were reviewed with patient, and questions were answered. Pt was taken to main entrance via wheelchair by NT.  

## 2017-09-27 NOTE — Progress Notes (Signed)
Cardiology Office Note   Date:  09/28/2017   ID:  Charlotte Castro, DOB July 11, 1932, MRN [id number]  PCP:  Leeroy Cha, MD    No chief complaint on file. AFib   Wt Readings from Last 3 Encounters:  09/28/17 194 lb (88 kg)  09/20/17 199 lb (90.3 kg)  09/13/17 199 lb (90.3 kg)       History of Present Illness: Charlotte Castro is a 84 y.o. female  * who has had several different types of tachyarrhythmia and bradycardia which have been difficult to manage over the past few years, since 2012-13; due to other medical issues. She has had syncope and was hospitalized in 2014. We have tried to evaluate her rhythm at home to see if she has significant pauses or heart rates below 40. She does not want to wear any type of heart monitor. Her daughter came in the past, annoyed with her mothers current health condition. Daughter stated she is self-employed so she has to take time off from work every time the mother is sick. She "wanted to get to the bottom of this." She was sent back to Dr. Rayann Heman. THere was no indication for pacer.  She had a grey coating on her eyelid-thought to be related to Amiodarone.  She is now off of amiodarone.  The patient was seeing neurology, but she stopped. She has a tremor and had been started on Parkinson's medicines. These aparently have been stopped. The patient disagreed with the diagnosis of Parkinsons. She was seen in the emergency room for syncope in 2014. Her main complaint is feeling weak and having joint pains. She does admit to significant anxiety which happens at random times. The daughter felt that anxiety is a big component of the patient's illness, when she was there at the last visit in 2/14.  THe patient was seen by ortho and rheumatology in the past. She took prednisone but this was tapered off. She still reports no energy and fatigue. She has had some difficulty in regulating her INR. She is willing to try Eliquis but not Xarelto.  She  heard bad things about Xarelto.  She had problems getting her Eliquis.  It apparently was not covered and would be very expensive.  SHe switched to warfarin.  No significant bleeding problems.  She had some mild hematuria in the past before warfarin which has resolved.   In the past, she had a large hiatal hernia and was referred to surgery, but she declined an operation. She is trying to stick to a soft diet.     In the past, She had a fall, getting out of the car.  No syncope.  No hitting her head.    She had GYN surgery, records reveal: " On 09/20/2017, the patient underwent the following: Procedure(s): XI ROBOTIC ASSISTED TOTAL HYSTERECTOMY WITH BILATERAL SALPINGO OOPHORECTOMY, SENTINAL LYMPH NODE BIOPSY.   The postoperative course was uneventful.  She was discharged to home on postoperative day 1 tolerating a regular diet, ambulating, pain controlled. She is to resume her Coumadin 5 days after surgery.  She has resumed Coumadin.  INR today was < 2.0.  She feels weak since the surgery and she is frustrated that she cannot get help.   She is able to walk.  She had some warmth at her abdominal sites but this has improved.  She is going to see Dr. Denman George in the next few weeks.      Past Medical History:  Diagnosis Date  .  Anemia    history of  . Anxiety   . Arthritis    "left knee" (04/05/2017)  . Atrial fibrillation ([place])   . Atrial flutter ([place])    typical appearing  . Atrial tachycardia ([place])    ablated 11/17/10  by JA  from the [facility] of the aorta  . Chest pain on exertion   . CHF (congestive heart failure) ([place])   . Chronic lower back pain   . Chronic nausea   . Dizziness    chronic and of an unclear etiology  . Dyspnea   . Endometrial cancer ([facility])    grade 1  . Gallstone pancreatitis   . Gastritis   . GERD (gastroesophageal reflux disease)   . History of blood transfusion ~ 1948  . History of hiatal hernia   . HTN (hypertension)   . Hyperlipemia   . Hypothyroidism     . Left leg numbness   . Obesity   . Osteoporosis   . PMB (postmenopausal bleeding)   . Sinus headache   . Toe ulcer ([place])    left 3rd toe  . Vitamin D deficiency     Past Surgical History:  Procedure Laterality Date  . ATRIAL ABLATION SURGERY  11/17/10   Atrial tachycardia arising from [facility] of the aorta ablated by JA  . BACK SURGERY    . CARDIAC CATHETERIZATION  01/11/2011   Archie Endo 01/12/2011  . CHOLECYSTECTOMY N/A 04/08/2017   Procedure: LAPAROSCOPIC CHOLECYSTECTOMY;  Surgeon: Georganna Skeans, MD;  Location: [facility];  Service: General;  Laterality: N/A;  . FRACTURE SURGERY    . HYSTEROSCOPY W/D&C N/A 08/05/2017   Procedure: DILATATION AND CURETTAGE /HYSTEROSCOPY AND POLYPECTOMY;  Surgeon: Osborne Oman, MD;  Location: [facility];  Service: Gynecology;  Laterality: N/A;  . LUMBAR SPINE SURGERY  ~ 1998  . ROBOTIC ASSISTED TOTAL HYSTERECTOMY WITH BILATERAL SALPINGO OOPHERECTOMY Bilateral 09/20/2017   Procedure: XI ROBOTIC ASSISTED TOTAL HYSTERECTOMY WITH BILATERAL SALPINGO OOPHORECTOMY, SENTINAL LYMPH NODE BIOPSY;  Surgeon: Everitt Amber, MD;  Location: [facility];  Service: Gynecology;  Laterality: Bilateral;  . SHOULDER SURGERY Right 1984 X2   Rollene Rotunda 01/19/2011  . WRIST FRACTURE SURGERY Left 1983     Current Outpatient Medications  Medication Sig Dispense Refill  . levothyroxine (SYNTHROID, LEVOTHROID) 88 MCG tablet Take 1 tablet (88 mcg total) by mouth daily before breakfast. 30 tablet 0  . metoprolol tartrate (LOPRESSOR) 50 MG tablet Take 1 tablet (50 mg total) by mouth 2 (two) times daily. 60 tablet 0  . warfarin (COUMADIN) 5 MG tablet Resume Sunday, April 14. Take 1/2 tab to 1 tab daily or as directed by Coumadin Clinic 30 tablet 2   No current facility-administered medications for this visit.     Allergies:   Iodinated diagnostic agents and Penicillins    Social History:  The patient  reports that she has never smoked. She has never used smokeless tobacco. She reports that she does  not drink alcohol or use drugs.   Family History:  The patient's family history includes Heart attack in her father; Hypertension in her father.    ROS:  Please see the history of present illness.   Otherwise, review of systems are positive for weakness.   All other systems are reviewed and negative.    PHYSICAL EXAM: VS:  BP 94/60 (BP Location: Left Arm, Patient Position: Sitting)   Pulse (!) 113   Ht 5\' 8"  (1.727 m)   Wt 194 lb (88 kg)   SpO2 99%  BMI 29.50 kg/m  , BMI Body mass index is 29.5 kg/m. GEN: Well nourished, well developed, in no acute distress  HEENT: normal  Neck: no JVD, carotid bruits, or masses Cardiac: irregularly irregular, tachycardic; no murmurs, rubs, or gallops,no edema  Respiratory:  clear to auscultation bilaterally, normal work of breathing GI: soft, nontender, nondistended, + BS; mild bruising at a surgical site MS: no deformity or atrophy  Skin: warm and dry, no rash Neuro:  Strength and sensation are intact Psych: euthymic mood, full affect   EKG:   The ekg ordered today demonstrates AFib with RVR   Recent Labs: 04/10/2017: Magnesium 1.7 06/10/2017: B Natriuretic Peptide 182.0 09/13/2017: ALT 16 09/21/2017: BUN 23; Creatinine, Ser 1.00; Hemoglobin 12.6; Platelets 192; Potassium 4.3; Sodium 138   Lipid Panel No results found for: CHOL, TRIG, HDL, CHOLHDL, VLDL, LDLCALC, LDLDIRECT   Other studies Reviewed: Additional studies/ records that were reviewed today with results demonstrating: Hospital records reviewed.   ASSESSMENT AND PLAN:  1. AFib: COntinue metoprolol.  Hopefully, HR will get better as she improved from the surgery 2. Chronic fatigue/SHOB: Surgery has not helped this issue.  More activity will help her stamina.  Check CBC, BMet and BNP. 3. HTN: Controlled.  94/60 today.  WOuld not increase metoprolol at this time.   4. Anticoagulated: Followed in Coumadin clinic.    Current medicines are reviewed at length with the patient  today.  The patient concerns regarding her medicines were addressed.  The following changes have been made:  No change  Labs/ tests ordered today include:   Orders Placed This Encounter  Procedures  . CBC  . Basic metabolic panel  . Pro b natriuretic peptide (BNP)  . EKG 12-Lead    Recommend 150 minutes/week of aerobic exercise Low fat, low carb, high fiber diet recommended  Disposition:   FU in 6 months   Signed, Larae Grooms, MD  09/28/2017 11:02 AM    [facility] [place], [place], [place]  [address] Phone: [phone]; Fax: [phone]

## 2017-09-28 ENCOUNTER — Ambulatory Visit (INDEPENDENT_AMBULATORY_CARE_PROVIDER_SITE_OTHER): Payer: Medicare Other | Admitting: Interventional Cardiology

## 2017-09-28 ENCOUNTER — Ambulatory Visit (INDEPENDENT_AMBULATORY_CARE_PROVIDER_SITE_OTHER): Payer: Medicare Other | Admitting: *Deleted

## 2017-09-28 ENCOUNTER — Encounter: Payer: Self-pay | Admitting: Interventional Cardiology

## 2017-09-28 VITALS — BP 94/60 | HR 113 | Ht 68.0 in | Wt 194.0 lb

## 2017-09-28 DIAGNOSIS — I482 Chronic atrial fibrillation: Secondary | ICD-10-CM

## 2017-09-28 DIAGNOSIS — I10 Essential (primary) hypertension: Secondary | ICD-10-CM | POA: Diagnosis not present

## 2017-09-28 DIAGNOSIS — Z7901 Long term (current) use of anticoagulants: Secondary | ICD-10-CM | POA: Diagnosis not present

## 2017-09-28 DIAGNOSIS — Z5181 Encounter for therapeutic drug level monitoring: Secondary | ICD-10-CM | POA: Diagnosis not present

## 2017-09-28 DIAGNOSIS — I4821 Permanent atrial fibrillation: Secondary | ICD-10-CM

## 2017-09-28 DIAGNOSIS — R0602 Shortness of breath: Secondary | ICD-10-CM | POA: Diagnosis not present

## 2017-09-28 LAB — BASIC METABOLIC PANEL
BUN/Creatinine Ratio: 17 (ref 12–28)
BUN: 19 mg/dL (ref 8–27)
CO2: 19 mmol/L — AB (ref 20–29)
CREATININE: 1.15 mg/dL — AB (ref 0.57–1.00)
Calcium: 9.7 mg/dL (ref 8.7–10.3)
Chloride: 103 mmol/L (ref 96–106)
GFR calc Af Amer: 51 mL/min/{1.73_m2} — ABNORMAL LOW (ref >59)
GFR calc non Af Amer: 44 mL/min/{1.73_m2} — ABNORMAL LOW (ref >59)
GLUCOSE: 115 mg/dL — AB (ref 65–99)
Potassium: 4.1 mmol/L (ref 3.5–5.2)
Sodium: 139 mmol/L (ref 134–144)

## 2017-09-28 LAB — CBC
HEMATOCRIT: 44.5 % (ref 34.0–46.6)
HEMOGLOBIN: 15.5 g/dL (ref 11.1–15.9)
MCH: 32.9 pg (ref 26.6–33.0)
MCHC: 34.8 g/dL (ref 31.5–35.7)
MCV: 95 fL (ref 79–97)
Platelets: 273 10*3/uL (ref 150–379)
RBC: 4.71 x10E6/uL (ref 3.77–5.28)
RDW: 13.6 % (ref 12.3–15.4)
WBC: 12.7 10*3/uL — ABNORMAL HIGH (ref 3.4–10.8)

## 2017-09-28 LAB — POCT INR: INR: 1.2

## 2017-09-28 LAB — PRO B NATRIURETIC PEPTIDE: NT-PRO BNP: 840 pg/mL — AB (ref 0–738)

## 2017-09-28 NOTE — Patient Instructions (Signed)
Description   Today April 17th  take 1 and 1/2 tablets (7.5mg ) then tomorrow April 18th take 1 and 1/2 tablets (7.5mg ) then continue taking Coumadin 1 tablet (5mg ) every day except 1/2 tablet (2.5mg ) on Sundays. Recheck INR 1 week . Call if placed on any new medications [phone] or if you have any upcoming procedures.

## 2017-09-28 NOTE — Patient Instructions (Addendum)
Medication Instructions:  Your physician recommends that you continue on your current medications as directed. Please refer to the Current Medication list given to you today.   Labwork: TODAY: CBC, BMET, BNP  Testing/Procedures: None ordered  Follow-Up: Your physician wants you to follow-up in: 6 months with Dr. Irish Lack. You will receive a reminder letter in the mail two months in advance. If you don't receive a letter, please call our office to schedule the follow-up appointment.   Any Other Special Instructions Will Be Listed Below (If Applicable).     If you need a refill on your cardiac medications before your next appointment, please call your pharmacy.

## 2017-10-03 ENCOUNTER — Telehealth: Payer: Self-pay | Admitting: *Deleted

## 2017-10-03 ENCOUNTER — Telehealth: Payer: Self-pay | Admitting: Gynecologic Oncology

## 2017-10-03 ENCOUNTER — Encounter: Payer: Self-pay | Admitting: Gynecologic Oncology

## 2017-10-03 DIAGNOSIS — C541 Malignant neoplasm of endometrium: Secondary | ICD-10-CM

## 2017-10-03 NOTE — Progress Notes (Signed)
Gynecologic Oncology Multi-Disciplinary Disposition Conference Note  Date of the Conference: October 03, 2017  Patient Name: Charlotte Castro  Referring Provider: Dr. Harolyn Rutherford Primary GYN Oncologist: Dr. Everitt Amber  Stage/Disposition:  Stage IB endometrioid adenocarcinoma. Disposition is for PET scan or CT scan if PET denied by insurance followed by whole pelvic radiation therapy and vaginal brachytherapy.   This Multidisciplinary conference took place involving physicians from [place], [place], Radiation Oncology, Pathology, Radiology along with the Gynecologic Oncology Nurse Practitioner and RN.  Comprehensive assessment of the patient's malignancy, staging, need for surgery, chemotherapy, radiation therapy, and need for further testing were reviewed. Supportive measures, both inpatient and following discharge were also discussed. The recommended plan of care is documented. Greater than 35 minutes were spent correlating and coordinating this patient's care.

## 2017-10-03 NOTE — Telephone Encounter (Signed)
Discussed tumor board recommendations with the patient's son.  Discussed path report.  Advised of the recommendation for a PET scan followed by referral to meet with radiation oncologist.  No concerns voiced.  Advised he would be contacted with the date and time for the PET scan. Dr. Clabe Seal appt will be on May 15 with RN at 8 am and Dr. Sondra Come at 8:30am.

## 2017-10-03 NOTE — Telephone Encounter (Signed)
Called and spoke with the patient's son regarding her PET scan appt. He asked that I call back and leave all appts on is voicemail. Called and left the son a message with the appts for the PET scan on April 29th at 7am arriving at 6:30am, appt with Dr. Denman George on May 10th and the appts with Dr.Kinard on May 15th.

## 2017-10-04 ENCOUNTER — Telehealth: Payer: Self-pay | Admitting: *Deleted

## 2017-10-04 NOTE — Telephone Encounter (Signed)
Spoke with pt and she states she wanted to cancel her appt in the coumadin clinic for tomorrow as she has many Doctors appts and she cannot continue to bother her children by having them bring her to the scheduled appts Stressed to pt she needs to have her INR checked as she was very thick on last visit Pt states she saw Dr Irish Lack and that she was told her blood was okay Instructed that her INR was not checked and she needs to come in to have ir checked as she does not want to have blood clot or stroke Informed pt that this nurse was going to call and talk with her son about rescheduling her coumadin appt and she states that I was not to call either her son or her daughter regarding this as she was tired of people going behind her back regarding scheduling appts . She did promise  me that she would call back this week to reschedule and this nurse informed her that would call her back if not heard from her by Friday.

## 2017-10-07 ENCOUNTER — Telehealth: Payer: Self-pay | Admitting: *Deleted

## 2017-10-07 NOTE — Telephone Encounter (Signed)
Spoke with pt's son to try to get her rescheduled in coumadin clinic to have INR checked and he states they are dealing with her problem with cancer and either he or his sister will call and set up an appt for her in the coumadin clinic at a future time Risks of her not having INR checked explained to pt's son with understanding

## 2017-10-10 ENCOUNTER — Ambulatory Visit (HOSPITAL_COMMUNITY)
Admission: RE | Admit: 2017-10-10 | Discharge: 2017-10-10 | Disposition: A | Discharge status: Home / Self Care | Payer: Medicare Other | Source: Ambulatory Visit | Attending: Gynecologic Oncology | Admitting: Gynecologic Oncology

## 2017-10-10 DIAGNOSIS — I2510 Atherosclerotic heart disease of native coronary artery without angina pectoris: Secondary | ICD-10-CM | POA: Diagnosis not present

## 2017-10-10 DIAGNOSIS — Z90710 Acquired absence of both cervix and uterus: Secondary | ICD-10-CM | POA: Diagnosis not present

## 2017-10-10 DIAGNOSIS — K449 Diaphragmatic hernia without obstruction or gangrene: Secondary | ICD-10-CM | POA: Insufficient documentation

## 2017-10-10 DIAGNOSIS — C779 Secondary and unspecified malignant neoplasm of lymph node, unspecified: Secondary | ICD-10-CM | POA: Diagnosis present

## 2017-10-10 DIAGNOSIS — R918 Other nonspecific abnormal finding of lung field: Secondary | ICD-10-CM | POA: Diagnosis not present

## 2017-10-10 DIAGNOSIS — I700 Atherosclerosis of aorta: Secondary | ICD-10-CM | POA: Diagnosis not present

## 2017-10-10 DIAGNOSIS — C541 Malignant neoplasm of endometrium: Secondary | ICD-10-CM | POA: Insufficient documentation

## 2017-10-10 LAB — GLUCOSE, CAPILLARY: GLUCOSE-CAPILLARY: 101 mg/dL — AB (ref 65–99)

## 2017-10-10 IMAGING — CT NM PET TUM IMG INITIAL (PI) SKULL BASE T - THIGH
1 of 8 series · 1 of 25 positions shown · non-contrast
Comparison: Abdominopelvic CT of [DATE]. Pelvic ultrasound
[DATE]. Chest radiograph [DATE].

CLINICAL DATA: Initial treatment strategy for endometrial cancer,
treatment planning. Metastatic disease to lymph nodes..

EXAM:
NUCLEAR MEDICINE PET SKULL BASE TO THIGH
TECHNIQUE: 9.7 mCi F-18 FDG was injected intravenously. Full-ring PET imaging
was performed from the skull base to thigh after the radiotracer. CT
data was obtained and used for attenuation correction and anatomic
localization.
Fasting blood glucose: 101 mg/dl

[Series 4: ct sk_thigh 5.0 b31f · axial · 5.0mm · 0.98mm/px · 1 of 220 slices shown]
[im 220/220  brain]
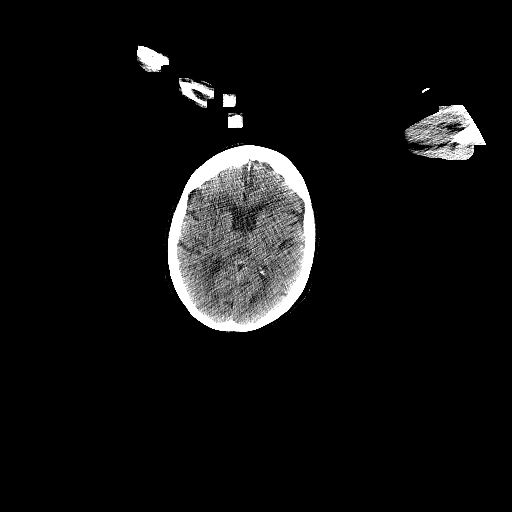

[1 of 25 positions shown; findings below may reference images not displayed]

FINDINGS: Mediastinal blood pool activity: SUV max

NECK: Motion and muscular activity with resultant degradation. No
cervical nodal hypermetabolism identified. Laryngeal hypermetabolism
is likely physiologic.

Incidental CT findings: Cerebral and cerebellar atrophy. Bilateral
carotid atherosclerosis. No cervical adenopathy.

CHEST: Right hilar hypermetabolism is without well-defined CT
correlate. This measures a S.U.V. max of 4.9 on image 70/4. Similar
findings in left infrahilar region, including at a S.U.V. max of
on image 75/4.

Incidental CT findings: Mild cardiomegaly with multivessel coronary
artery atherosclerosis. A large hiatal hernia with greater than half
of the stomach positioned within the lower chest. Pulmonary artery
enlargement, outflow tract 3.1 cm. 6 mm right middle or right upper
lobe pulmonary nodule on image 34/8. Volume loss in the right lower
lobe dependently. 7 mm left lower lobe pulmonary nodule on image
44/8. Smaller bilateral pulmonary nodules.

ABDOMEN/PELVIS: Abdominal wall foci of hypermetabolism and
subcutaneous increased density may be postoperative or secondary to
injection sites. Vague hypermetabolism along the left external iliac
vasculature is without correlate adenopathy and may correspond to
residual ovarian tissue in this area. Example image 178/4.

Incidental CT findings: Cholecystectomy. Dense aortic
atherosclerosis. Interval hysterectomy. Small amount of fluid in the
pelvis may be postoperative. Equivocal air within the urinary
bladder including on image 193/4.

SKELETON: No abnormal marrow activity.

Incidental CT findings: Right hip osteoarthritis. Lumbar spine
fixation. Mild osteopenia.
IMPRESSION: 1. Interval hysterectomy. No hypermetabolic metastatic disease
identified.
2. Bilateral pulmonary nodules, below PET resolution. Consider
dedicated chest CT to serve as a baseline for follow-up. In
addition, follow-up diagnostic chest CT at 3 months should be
considered.
3. Hypermetabolism about the right hilum and left infrahilar region,
favored to be physiologic/vascular. No well-defined adenopathy in
this area. This would also be better evaluated on dedicated chest CT
(preferably with contrast).
4. Incidental findings, including hiatal hernia, possible pulmonary
arterial hypertension.
5. Coronary artery atherosclerosis. Aortic Atherosclerosis
([W7]-[W7]).
6. Small amount of free pelvic fluid, likely postoperative.
7. Equivocal air within the urinary bladder. Correlate with recent
instrumentation and any symptoms of cystitis.

## 2017-10-10 MED ORDER — FLUDEOXYGLUCOSE F - 18 (FDG) INJECTION
9.7000 | Freq: Once | INTRAVENOUS | Status: AC | PRN
  Administered 2017-10-10: 9.7 via INTRAVENOUS

## 2017-10-11 ENCOUNTER — Other Ambulatory Visit: Payer: Self-pay | Admitting: Gynecologic Oncology

## 2017-10-12 NOTE — Progress Notes (Signed)
GYN Location of Tumor / Histology: Charlotte Castro I Endometrial adenocarcinoma  Charlotte Castro presented with symptoms of: 2-24-month history of vaginal bleeding which was light.  Biopsies revealed:  09/20/2017: 1. Lymph node, sentinel, biopsy, right external iliac - LYMPH NODE, WITH MULTIPLE ISOLATED TUMOR CELLS AND SMALL CLUSTERS OF METASTATIC CARCINOMA (ITC), EACH LESS THAN 0.2 MILLIMETERS - IMMUNOHISTOCHEMICAL STAIN FOR PANKERATIN (AE1/AE3) HIGHLIGHTS THE ISOLATED TUMOR CELLS 2. Lymph node, sentinel, biopsy, left external iliac - LYMPH NODE, WITH MULTIPLE ISOLATED TUMOR CELLS AND SMALL CLUSTERS OF METASTATIC CARCINOMA (ITC), EACH LESS THAN 0.2 MILLIMETERS - IMMUNOHISTOCHEMICAL STAIN FOR PANKERATIN (AE1/AE3) HIGHLIGHTS THE ISOLATED TUMOR CELLS 3. Uterus +/- tubes/ovaries, neoplastic - ENDOMETRIOID ADENOCARCINOMA, FIGO GRADE 1, 1.7 CM, INVOLVING LEFT CORNU - TUMOR INVADES MORE THAN HALF OF MYOMETRIAL THICKNESS - TUMOR MULTIFOCALLY INVOLVES ADENOMYOSIS - TUMOR INVOLVES THE POSTERIOR LOWER UTERINE SEGMENT BUT DOES NOT INVOLVE CERVIX - NEGATIVE FOR LYMPHOVASCULAR INVASION - SURGICAL RESECTIONS MARGINS ARE NEGATIVE FOR CARCINOMA - UNREMARKABLE BILATERAL OVARIAN TUBES AND OVARIES  Past/Anticipated interventions by Gyn/Onc surgery, if any:  08/05/2017 Diagnostic Hysteroscopy, Cervical Polypectomy, Polypectomy, Dilation and Curettage 09/20/2017 Robotic-assisted laparoscopic total hysterectomy with bilateral salpingoophorectomy, SLN biopsy   Past/Anticipated interventions by medical oncology, if any: None  Weight changes, if any: Recently lost several pounds. Has been trying to drink supplemental beverages but does not feel like they're helping.  Bowel/Bladder complaints, if any: None  Nausea/Vomiting, if any: Feels very nauseous upon first waking up in the morning, and is having difficulty with her appetite, but no issues with vomiting.  Pain issues, if any:  Mild positional discomfort where  "ribs rub on incision area" from having to lean over to catch her breath.   SAFETY ISSUES:  Prior radiation? No  Pacemaker/ICD? No  Possible current pregnancy? No  Is the patient on methotrexate? No  Current Complaints / other details:   Charlotte Castro presents today with her son for her radiation consult. She is very out of breath and anxious about possibly proceeding with radiation. She's worried about making all her doctors appointments and having to rely on her children. She doesn't feel like her old self and would like to get back to her baseline, but doesn't want to be an inconvenience.

## 2017-10-21 ENCOUNTER — Encounter: Payer: Self-pay | Admitting: Gynecologic Oncology

## 2017-10-21 ENCOUNTER — Inpatient Hospital Stay: Payer: Medicare Other | Attending: Gynecologic Oncology | Admitting: Gynecologic Oncology

## 2017-10-21 VITALS — BP 132/71 | HR 89 | Temp 97.5°F | Resp 20 | Ht 68.0 in | Wt 191.7 lb

## 2017-10-21 DIAGNOSIS — Z90722 Acquired absence of ovaries, bilateral: Secondary | ICD-10-CM | POA: Insufficient documentation

## 2017-10-21 DIAGNOSIS — Z91041 Radiographic dye allergy status: Secondary | ICD-10-CM

## 2017-10-21 DIAGNOSIS — Z7189 Other specified counseling: Secondary | ICD-10-CM

## 2017-10-21 DIAGNOSIS — Z90710 Acquired absence of both cervix and uterus: Secondary | ICD-10-CM | POA: Insufficient documentation

## 2017-10-21 DIAGNOSIS — Z923 Personal history of irradiation: Secondary | ICD-10-CM | POA: Diagnosis not present

## 2017-10-21 DIAGNOSIS — C541 Malignant neoplasm of endometrium: Secondary | ICD-10-CM | POA: Diagnosis present

## 2017-10-21 MED ORDER — DIPHENHYDRAMINE HCL 50 MG PO TABS: 50.0000 mg | ORAL_TABLET | Freq: Once | ORAL | 0 refills | Status: DC

## 2017-10-21 MED ORDER — PREDNISONE 50 MG PO TABS: ORAL_TABLET | ORAL | 0 refills | Status: DC

## 2017-10-21 NOTE — Progress Notes (Signed)
Consult Note: Gyn-Onc  Consult was requested by Dr. Harolyn Rutherford for the evaluation of Charlotte Castro 84 y.o. female  CC:  Chief Complaint  Patient presents with  . Endometrial cancer [facility])    Assessment/Plan:  [name]  is a 84 y.o.  year old with stage IB (with isolated tumor cells in SLN's), grade I endometrioid endometrioid adenocarcinoma. MSI normal/stable.  Due to high risk features our recommendation is for whole pelvic RT with vaginal brachytherapy.  Given the unclear clinical significance of isolated tumor cells, and retrospective evidence that suggests prognostically these do not represent the same risk of micrometastatic disease, adjuvant chemotherapy is not recommended.  Recommend baseline CT chest to evaluate pulmonary nodules more closely.  She will see Dr. Sondra Come for discussion regarding radiation therapy.  She is finding treatment with Coumadin very difficult due to the frequent lab testing.  I recommended that she discuss with her cardiologist changing to an agent that does not require surveillance, if appropriate, and only he can determine this.  His symptoms of fatigue most likely secondary to surgical healing in a patient of advanced age with multiple medical comorbidities and I see no concerning signs in her postoperative recovery.  I will see her back for surveillance follow-up after she is completed her radiation therapy, after which time we will transition to 3 monthly surveillance evaluations.  HPI: Ms Charlotte Castro is an 84 year old parous woman who is seen in consultation at the request of Dr Harolyn Rutherford for grade 1 endometrial cancer.  The patient reports that 2-32-monthhistory of vaginal bleeding which is light.  She was taken to the operating room on 520-second 2019 by Dr. AHarolyn Rutherfordfor [facility] which revealed a FIGO grade 1 endometrial adenocarcinoma.  Prior to surgery she had undergone an ultrasound scan which revealed a uterus measuring 5.6 x 3.2 x 3.3 cm,  with a thickened endometrium of 12 mm.  The right and left ovary were not visualized.  There is no abnormal free fluid.  She was prescribed megace therapy.   The patient's past medical history is significant for the below stated conditions.  In particular she has atrial fibrillation and atrial flutter for which she is on multiple cardiac medications and anticoagulation with Coumadin therapeutic to 2.5.  She also has had a recent history [facility]  She had laparoscopic cholecystectomy and a total 7-day hospital stay followed by 21-day rehab stay for general deconditioning.  The patient was discharged to home where she lives alone.  Other past medical history of significance is gastritis.  She has gastroesophageal reflux disease and chronic nausea which is unexplained.  She has been seen by gastroenterologist for this recently however has not had an EGD.  No imaging has revealed evidence of an etiology for her persistent nausea.  She has a history of lumbar spine surgery shoulder surgery and fracture surgery.  She has a history of a cardiac catheterization in 2012.  She also had atrial ablation surgery in 2012.  With respect to cardiac workup [facility]  Her most recent cardiac echo was in December 2018 which was a stress echo and revealed an ejection fraction of 68%.   Interval Hx:  On September 20, 2017 she underwent a robotic assisted total hysterectomy BSO and pelvic sentinel lymph node biopsy.  Surgery was uncomplicated.  Intraoperative findings were unremarkable.  Final pathology revealed a 1.7 cm  grade 1 endometrioid adenocarcinoma.  There was 1.2 cm of 1.8 cm myometrial invasion representing more than one half.  No lymphovascular space invasion was identified.  The cervical stroma was negative for involvement as were the adnexa.  However in both sentinel lymph nodes there were small  clusters of metastatic carcinoma each less than 0.2 mm, representing isolated tumor cells.  These were identified on immunostains for pankeratin.  She was staged as stage Ib pN0(1+), and in accordance to NCCN guidelines, due to high risk features and her pathology, adjuvant therapy with whole pelvic radiation and vaginal brachii therapy was recommended.  Given the low volume metastatic disease of unclear clinical significance in the sentinel nodes, chemotherapy was not recommended.  Postsurgical PET/CT was performed on October 10, 2017.  This revealed no gross metastatic disease, however there were indeterminate multiple pulmonary nodules the largest in size was millimeters in the right middle right upper lobe.  These were below PET resolution.  Recommendation was made for a dedicated chest CT to serve as a baseline and for follow-up CT in 3 months.  Current Meds:  Outpatient Encounter Medications as of 10/21/2017  Medication Sig  . levothyroxine (SYNTHROID, LEVOTHROID) 88 MCG tablet Take 1 tablet (88 mcg total) by mouth daily before breakfast.  . metoprolol tartrate (LOPRESSOR) 50 MG tablet Take 1 tablet (50 mg total) by mouth 2 (two) times daily.  Marland Kitchen warfarin (COUMADIN) 5 MG tablet Resume Sunday, April 14. Take 1/2 tab to 1 tab daily or as directed by Coumadin Clinic   No facility-administered encounter medications on file as of 10/21/2017.     Allergy:  Allergies  Allergen Reactions  . Iodinated Diagnostic Agents Shortness Of Breath    headache  . Penicillins Other (See Comments)    Tolerated Zosyn Oct 2018    Social Hx:   Social History   Socioeconomic History  . Marital status: Widowed    Spouse name: Not on file  . Number of children: 2  . Years of education: 42  . Highest education level: Not on file  Occupational History  . Not on file  Social Needs  . Financial resource strain: Not on file  . Food insecurity:    Worry: Not on file    Inability: Not on file  . Transportation  needs:    Medical: Not on file    Non-medical: Not on file  Tobacco Use  . Smoking status: Never Smoker  . Smokeless tobacco: Never Used  Substance and Sexual Activity  . Alcohol use: No  . Drug use: No  . Sexual activity: Never    Birth control/protection: Post-menopausal  Lifestyle  . Physical activity:    Days per week: Not on file    Minutes per session: Not on file  . Stress: Not on file  Relationships  . Social connections:    Talks on phone: Not on file    Gets together: Not on file    Attends religious service: Not on file    Active member of club or organization: Not on file    Attends meetings of clubs or organizations: Not on file    Relationship status: Not on file  . Intimate partner violence:    Fear of current or ex partner: Not on file    Emotionally abused: Not on file    Physically abused: Not on file    Forced sexual activity: Not on file  Other Topics Concern  . Not on file  Social History Narrative  Lives alone in a one story home.  Has 2 children.  Retired.  Education: high school.      Past Surgical Hx:  Past Surgical History:  Procedure Laterality Date  . ATRIAL ABLATION SURGERY  11/17/10   Atrial tachycardia arising from [facility] of the aorta ablated by JA  . BACK SURGERY    . CARDIAC CATHETERIZATION  01/11/2011   Archie Endo 01/12/2011  . CHOLECYSTECTOMY N/A 04/08/2017   Procedure: LAPAROSCOPIC CHOLECYSTECTOMY;  Surgeon: Georganna Skeans, MD;  Location: [facility];  Service: General;  Laterality: N/A;  . FRACTURE SURGERY    . HYSTEROSCOPY W/D&C N/A 08/05/2017   Procedure: DILATATION AND CURETTAGE /HYSTEROSCOPY AND POLYPECTOMY;  Surgeon: Osborne Oman, MD;  Location: [facility];  Service: Gynecology;  Laterality: N/A;  . LUMBAR SPINE SURGERY  ~ 1998  . ROBOTIC ASSISTED TOTAL HYSTERECTOMY WITH BILATERAL SALPINGO OOPHERECTOMY Bilateral 09/20/2017   Procedure: XI ROBOTIC ASSISTED TOTAL HYSTERECTOMY WITH BILATERAL SALPINGO OOPHORECTOMY, SENTINAL LYMPH NODE BIOPSY;   Surgeon: Everitt Amber, MD;  Location: [facility];  Service: Gynecology;  Laterality: Bilateral;  . SHOULDER SURGERY Right 1984 X2   Rollene Rotunda 01/19/2011  . WRIST FRACTURE SURGERY Left 1983    Past Medical Hx:  Past Medical History:  Diagnosis Date  . Anemia    history of  . Anxiety   . Arthritis    "left knee" (04/05/2017)  . Atrial fibrillation ([place])   . Atrial flutter ([place])    typical appearing  . Atrial tachycardia ([place])    ablated 11/17/10  by JA  from the [facility] of the aorta  . Chest pain on exertion   . CHF (congestive heart failure) ([place])   . Chronic lower back pain   . Chronic nausea   . Dizziness    chronic and of an unclear etiology  . Dyspnea   . Endometrial cancer (HCC)    grade 1  . Gallstone pancreatitis   . Gastritis   . GERD (gastroesophageal reflux disease)   . History of blood transfusion ~ 1948  . History of hiatal hernia   . HTN (hypertension)   . Hyperlipemia   . Hypothyroidism   . Left leg numbness   . Obesity   . Osteoporosis   . PMB (postmenopausal bleeding)   . Sinus headache   . Toe ulcer ([place])    left 3rd toe  . Vitamin D deficiency     Past Gynecological History:  SVD x 2 No LMP recorded. Patient is postmenopausal.  Family Hx:  Family History  Problem Relation Age of Onset  . Heart attack Father   . Hypertension Father     Review of Systems:  Constitutional  Feels fatigued   ENT Normal appearing ears and nares bilaterally Skin/Breast  No rash, sores, jaundice, itching, dryness Cardiovascular  + palpitations - has discussed with cardiologist. Pulmonary  No cough or wheeze.  Gastro Intestinal  No vomitting, or diarrhoea. No bright red blood per rectum, change in bowel movement, or constipation. No nausea Genito Urinary  No frequency, urgency, dysuria, no vaginal bleeding Musculo Skeletal  No myalgia, arthralgia, joint swelling or pain  Neurologic  No weakness, numbness, change in gait,  Psychology  No depression, anxiety,  insomnia.   Vitals:  Blood pressure 132/71, pulse 89, temperature (!) 97.5 F (36.4 C), temperature source Oral, resp. rate 20, height _0  (1.727 m), weight 191 lb 11.2 oz (87 kg), SpO2 100 %.  Physical Exam: WD in NAD Neck  Supple NROM, without any enlargements.  Lymph Node Survey No cervical supraclavicular or inguinal adenopathy Cardiovascular  Pulse normal rate, regularity and rhythm. S1 and S2 normal.  Lungs  Clear to auscultation bilateraly, without wheezes/crackles/rhonchi. Good air movement.  Skin  No rash/lesions/breakdown  Psychiatry  Alert and oriented to person, place, and time  Abdomen  Normoactive bowel sounds, abdomen soft, non-tender and nonobese without evidence of hernia. Well healed incisions.  Back No CVA tenderness Genito Urinary  Normal external genitalia. Vaginal cuff well healed with no bleeding, in tact cuff, no lesions.  Rectal  deferred Extremities  No bilateral cyanosis, clubbing or edema.   30 minutes of direct face to face counseling time was spent with the patient. This included discussion about prognosis, therapy recommendations and postoperative side effects and are beyond the scope of routine postoperative care.   Thereasa Solo, MD  10/21/2017, 3:12 PM

## 2017-10-21 NOTE — Patient Instructions (Addendum)
Your cancer is a stage IB endometrial cancer. There were tiny deposits of cancer found in the lymph nodes that were removed. This means that there is a very high chance that the cancer will return if no additional treatments are given. Therefore Dr Denman George is recommending that you receive a course of radiation to the pelvis. The appointment with Dr Sondra Come is to discuss this further.  If it is very difficult to return for checkups of your coumadin level, it might be valuable to discuss changing this medication to one that does not require monitoring. Dr Lacie Draft would be the person best able to make that determination.    The CT Scan of the Chest is on Wednesday May 15 th at 12 noon. You need to take Prednisone and Benadryl prior to the Scan to help prevent an allergic reaction the the IV contrast.  You need to take a prednisone 50 mg tablet  At 11 pm Tuesday night  with food.  Take prednisone 50 mg at 5 am Wednesday morning with food.  Take Prednisone 50 mg  And the Benadryl 50 mg tab at 11 am on Wednesday morning.  You can only drink water after 8 am Wednesday morning.  Prescriptions sent to [facility]. Prescription for prednisone

## 2017-10-24 ENCOUNTER — Telehealth: Payer: Self-pay

## 2017-10-24 NOTE — Telephone Encounter (Signed)
Confirmed with Ms Lehmkuhl son that he could bring his mother to a 7 am CT Chest at [facility] on 10-26-17.  Reviewed  Steroid Pre time table with Ms Evola as follows:  13 hr prior to Scan =6 pm Tuesday 10-25-17 Take prednisone 50 mg tablet with food. 7 hrs = 12 MN. Take prednisone 50 mg tablet with food. 1 hr = 6 am Wednesday 10-26-17 Take prednisone 50 mg and Benadryl 50 mg with water.  Only drink water 4 hrs prior to scan = 3 am Wednesday 10-26-17.  Pt wrote instructions down.  Teach Back Method used.

## 2017-10-25 ENCOUNTER — Ambulatory Visit (INDEPENDENT_AMBULATORY_CARE_PROVIDER_SITE_OTHER): Payer: Medicare Other | Admitting: *Deleted

## 2017-10-25 DIAGNOSIS — I482 Chronic atrial fibrillation: Secondary | ICD-10-CM

## 2017-10-25 DIAGNOSIS — Z5181 Encounter for therapeutic drug level monitoring: Secondary | ICD-10-CM

## 2017-10-25 DIAGNOSIS — I4821 Permanent atrial fibrillation: Secondary | ICD-10-CM

## 2017-10-25 LAB — POCT INR: INR: 2.5

## 2017-10-25 NOTE — Patient Instructions (Signed)
Description   Continue taking Coumadin 1 tablet (5mg ) every day except 1/2 tablet (2.5mg ) on Sundays. Recheck INR 4 weeks. Call if placed on any new medications [phone] or if you have any upcoming procedures.

## 2017-10-26 ENCOUNTER — Encounter: Payer: Self-pay | Admitting: Oncology

## 2017-10-26 ENCOUNTER — Ambulatory Visit
Admission: RE | Admit: 2017-10-26 | Discharge: 2017-10-26 | Disposition: A | Discharge status: Home / Self Care | Payer: Medicare Other | Source: Ambulatory Visit | Attending: Radiation Oncology | Admitting: Radiation Oncology

## 2017-10-26 ENCOUNTER — Ambulatory Visit (HOSPITAL_COMMUNITY): Payer: Medicare Other

## 2017-10-26 ENCOUNTER — Other Ambulatory Visit: Payer: Self-pay

## 2017-10-26 ENCOUNTER — Encounter: Payer: Self-pay | Admitting: Radiation Oncology

## 2017-10-26 ENCOUNTER — Encounter (HOSPITAL_COMMUNITY): Payer: Self-pay

## 2017-10-26 ENCOUNTER — Ambulatory Visit (HOSPITAL_COMMUNITY)
Admission: RE | Admit: 2017-10-26 | Discharge: 2017-10-26 | Disposition: A | Discharge status: Home / Self Care | Payer: Medicare Other | Source: Ambulatory Visit | Attending: Gynecologic Oncology | Admitting: Gynecologic Oncology

## 2017-10-26 VITALS — BP 147/89 | HR 103 | Temp 97.7°F | Resp 24 | Ht 68.0 in | Wt 198.6 lb

## 2017-10-26 DIAGNOSIS — I2510 Atherosclerotic heart disease of native coronary artery without angina pectoris: Secondary | ICD-10-CM | POA: Diagnosis not present

## 2017-10-26 DIAGNOSIS — I700 Atherosclerosis of aorta: Secondary | ICD-10-CM | POA: Insufficient documentation

## 2017-10-26 DIAGNOSIS — C541 Malignant neoplasm of endometrium: Secondary | ICD-10-CM | POA: Insufficient documentation

## 2017-10-26 DIAGNOSIS — R918 Other nonspecific abnormal finding of lung field: Secondary | ICD-10-CM | POA: Insufficient documentation

## 2017-10-26 DIAGNOSIS — Z510 Encounter for antineoplastic radiation therapy: Secondary | ICD-10-CM | POA: Insufficient documentation

## 2017-10-26 IMAGING — CT CT CHEST W/ CM
2 of 4 series · 15 of 36 positions shown, 18 images · IV contrast (omnipaque)
Comparison: PET [DATE] and CT abdomen pelvis [DATE].

CLINICAL DATA: Endometrial cancer. Pulmonary nodules. Chronic
shortness of breath.

EXAM:
CT CHEST WITH CONTRAST
TECHNIQUE: Multidetector CT imaging of the chest was performed during
intravenous contrast administration.
CONTRAST:  60mL OMNIPAQUE IOHEXOL 300 MG/ML  SOLN

[Series 2: axial st · axial · 0.70mm/px · z∈[+1414,+1704]mm · 12 of 171 slices shown, 15 images]
[im 13/171  mediastinal]
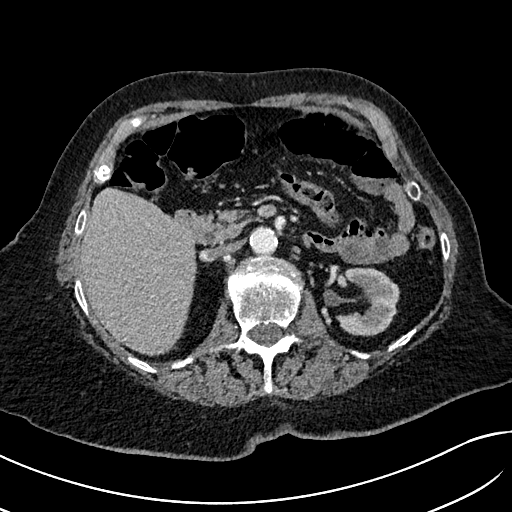
[im 13/171  lung]
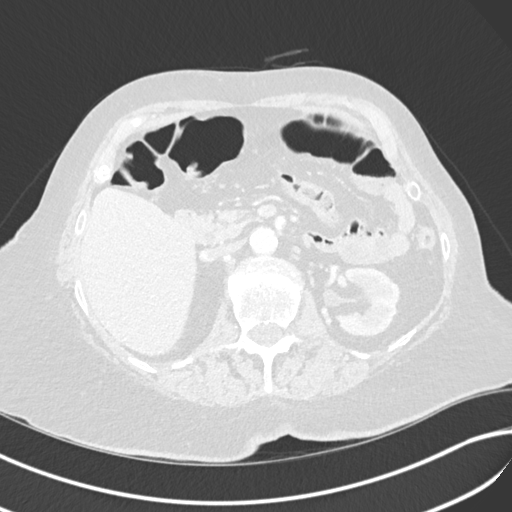
[im 25/171  lung]
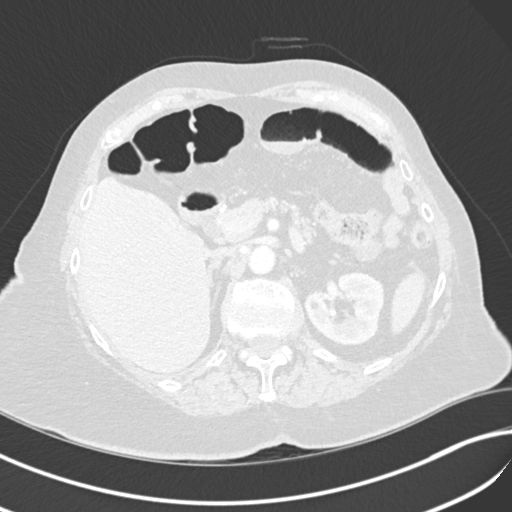
[im 37/171  lung]
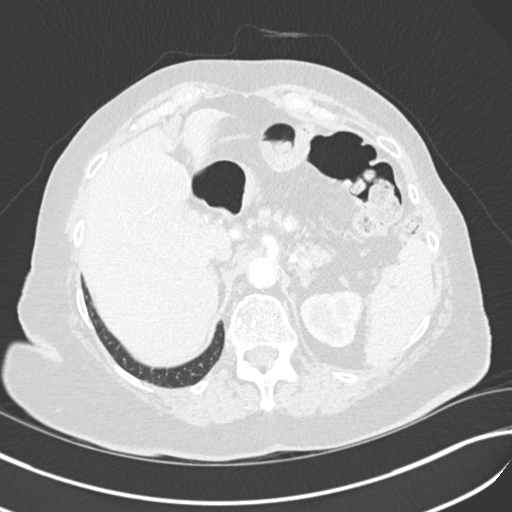
[im 49/171  lung]
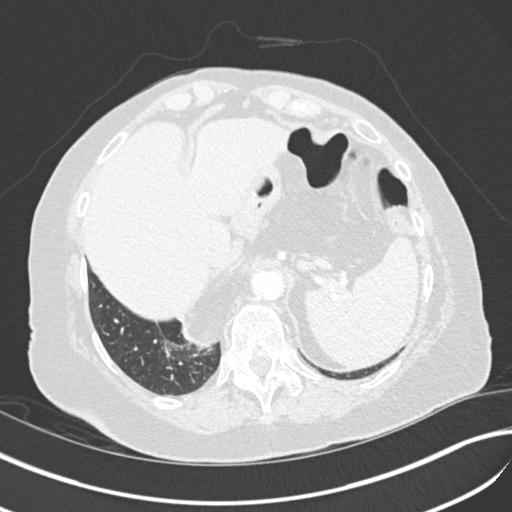
[im 61/171  mediastinal]
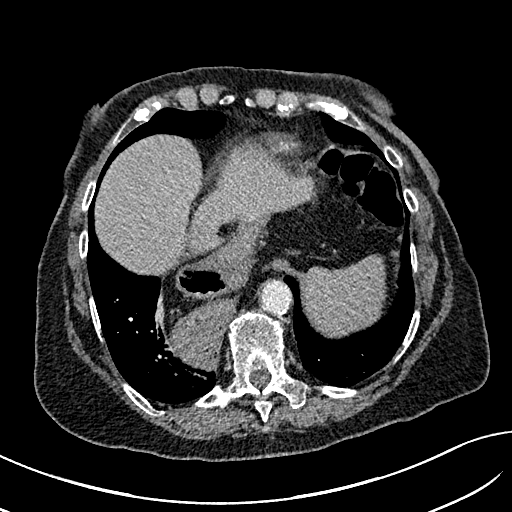
[im 61/171  lung]
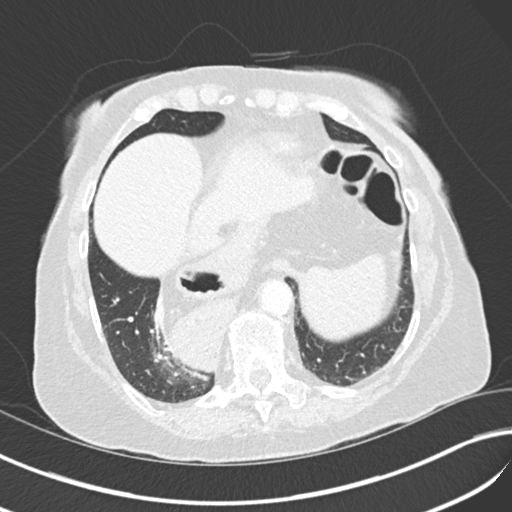
[im 73/171  lung]
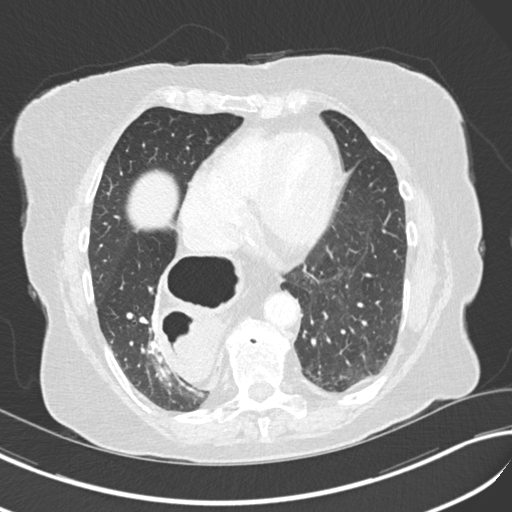
[im 98/171  lung]
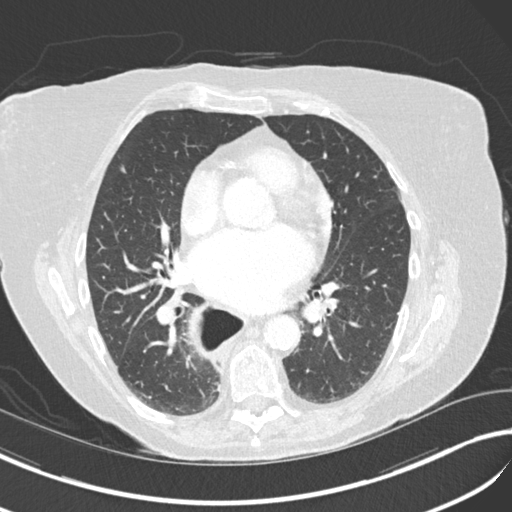
[im 110/171  lung]
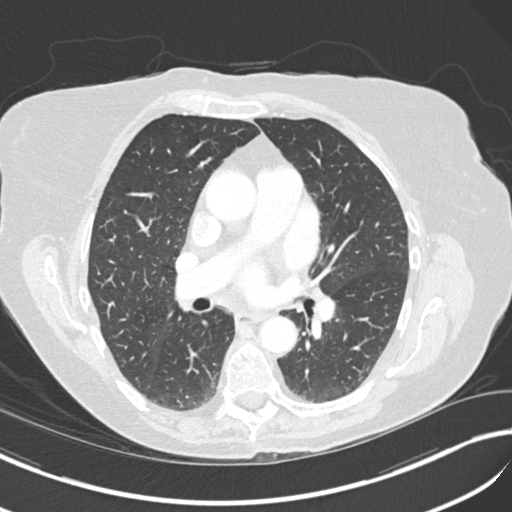
[im 122/171  mediastinal]
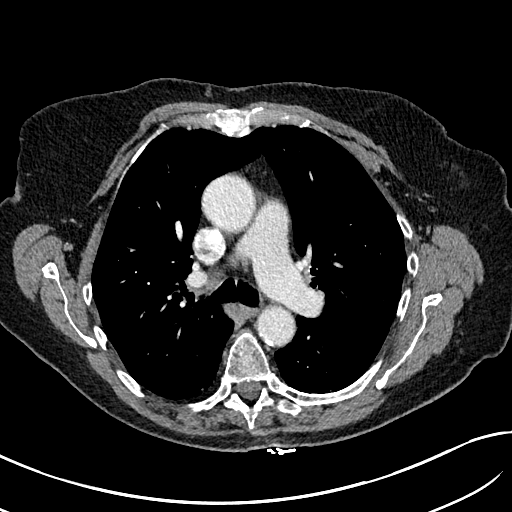
[im 122/171  lung]
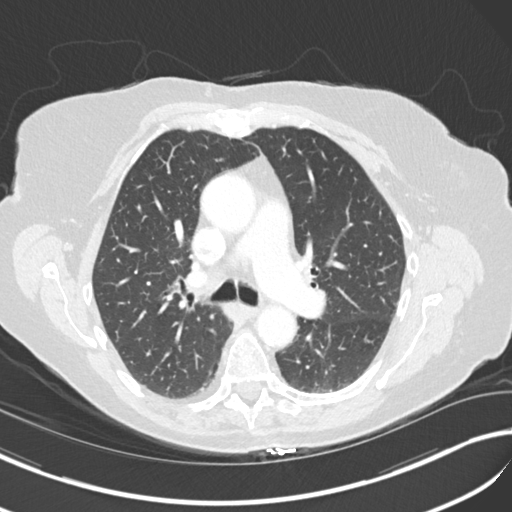
[im 134/171  lung]
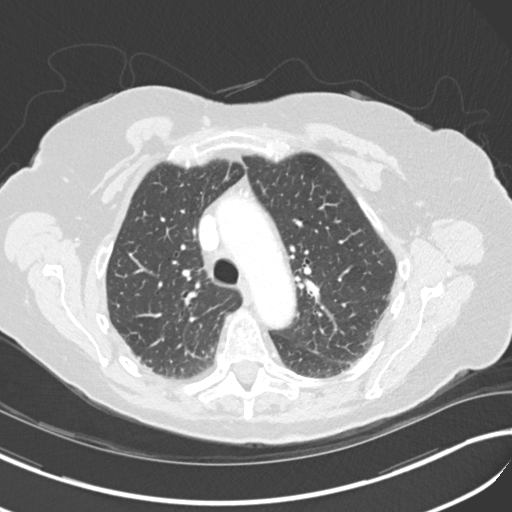
[im 146/171  lung]
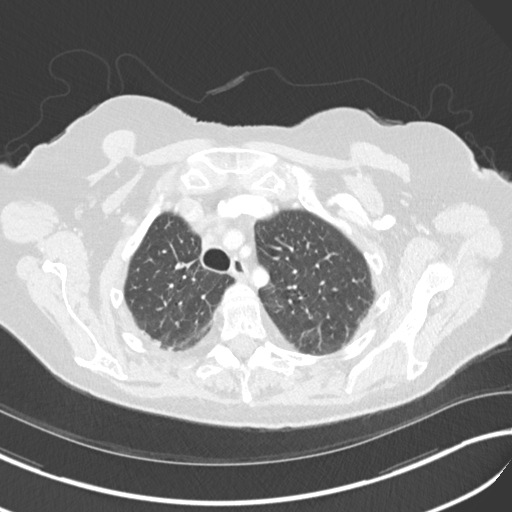
[im 158/171  lung]
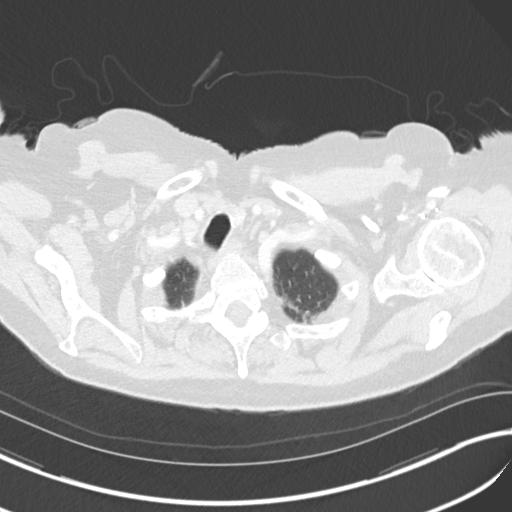

[Series 6: coronal · coronal · 0.71mm/px · 3 of 163 slices shown]
[im 33/163  lung]
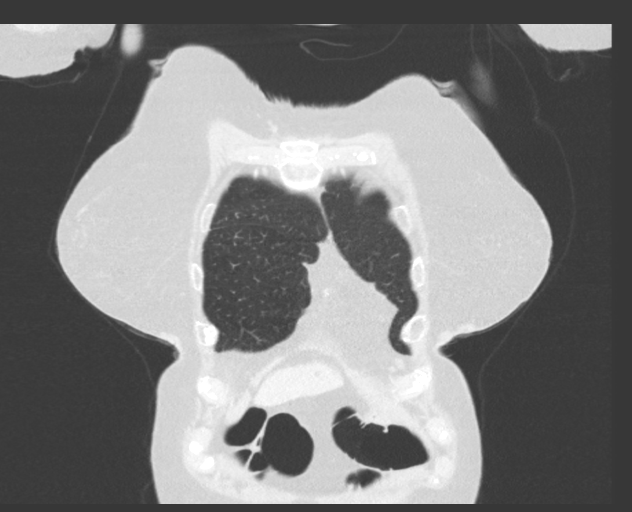
[im 65/163  lung]
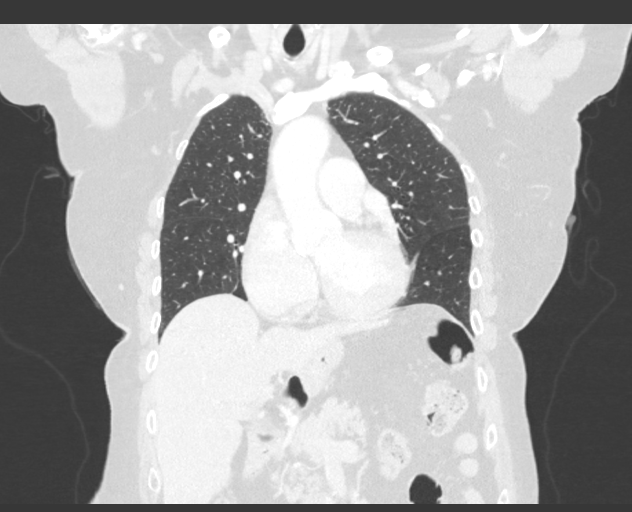
[im 98/163  lung]
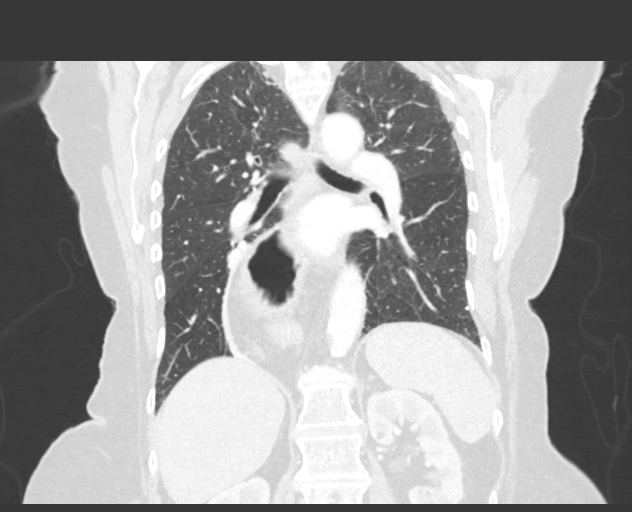

[15 of 36 positions shown; findings below may reference images not displayed]

FINDINGS: Cardiovascular: Atherosclerotic calcification of the arterial
vasculature, including coronary arteries. Pulmonary arteries and
heart are enlarged. No pericardial effusion.

Mediastinum/Nodes: Mediastinal lymph nodes are not enlarged by CT
size criteria. Right hilar lymph node measures 9 mm and in the left
hilum, up to 4 mm. No axillary adenopathy. Esophagus is grossly
unremarkable. Large hiatal hernia.

Lungs/Pleura: Biapical pleuroparenchymal scarring. Image quality is
degraded by respiratory motion. Smudgy nodules along the minor
fissure are indicative of subpleural lymph nodes. 6 mm left lower
lobe nodule (series 5, image 83), unchanged from [DATE] and
considered benign. Calcified granulomas. No pleural fluid. Airway is
unremarkable.

Upper Abdomen: Subcentimeter low-attenuation lesions in the liver
are too small characterize. Cholecystectomy. Visualized portions of
the adrenal glands and right kidney are unremarkable. Subcentimeter
low-attenuation lesion in the left kidney is too small to
characterize. Visualized portions of the spleen, pancreas, stomach
and bowel are grossly unremarkable the exception of a large hiatal.
No upper abdominal adenopathy.

Musculoskeletal: Degenerative changes in the spine and right
shoulder.
IMPRESSION: 1. Left lower lobe nodule is unchanged from [DATE] and
considered benign. Additional nodules along the minor fissure are
likely subpleural lymph nodes.
2. Bihilar lymph nodes are not considered pathologically enlarged by
CT size criteria, arguing against malignant hypermetabolism on
[DATE].
3. Aortic atherosclerosis ([CD]-170.0). Coronary artery
calcification.
4. Enlarged pulmonary arteries, indicative of pulmonary arterial
hypertension.

## 2017-10-26 MED ORDER — IOHEXOL 300 MG/ML  SOLN
75.0000 mL | Freq: Once | INTRAMUSCULAR | Status: AC | PRN
  Administered 2017-10-26: 60 mL via INTRAVENOUS

## 2017-10-26 NOTE — Progress Notes (Signed)
  Oncology Nurse Navigator Documentation  Navigator Location: [facility] (10/26/17 1057) Referral date to RadOnc/MedOnc: 10/03/17 (10/26/17 1057) )Navigator Encounter Type: Initial RadOnc (10/26/17 1057)     Confirmed Diagnosis Date: 09/20/17 (10/26/17 1057) Surgery Date: 09/20/17 (10/26/17 1057)             Patient Visit Type: RadOnc (10/26/17 1057) Treatment Phase: Pre-Tx/Tx Discussion (10/26/17 1057) Barriers/Navigation Needs: Transportation;Education (10/26/17 1057) Education: Understanding Cancer/ Treatment Options;Preparing for Upcoming Surgery/ Treatment;Transport During Treatment (10/26/17 1057) Interventions: Referrals;Transportation;Education (10/26/17 1057) Referrals: Social Work (10/26/17 1057)   Education Method: Verbal;Teach-back;Written (10/26/17 1057)      Acuity: Level 2 (10/26/17 1057)   Acuity Level 2: Educational needs;Initial guidance, education and coordination as needed (10/26/17 1057)     Time Spent with Patient: 60 (10/26/17 1057)   Met with patient during her consult with Dr. Sondra Come.  She said she will have trouble with transportation to and from appointments.  Lauren, CSW will be contacted.  Also discussed external beam radiation and brachytherapy treatments.  Brachytherapy handout was given and explained.

## 2017-10-26 NOTE — Progress Notes (Signed)
Radiation Oncology         [phone] ________________________________  Initial Outpatient Consultation  Name: Charlotte Castro MRN: [id number]  Date: 10/26/2017  DOB: 10-18-1932  MW:[id number], Ronie Spies, MD  Everitt Amber, MD   REFERRING PHYSICIAN: Everitt Amber, MD  DIAGNOSIS: Stage IB, grade I endometrioid adenocarcinoma, (with isolated tumor cells in the SLN's)   HISTORY OF PRESENT ILLNESS::Charlotte Castro is a 84 y.o. female who presented with a 2-3 month history of light vaginal bleeding. She presented to the ED and underwent ultrasound of the pelvis on 08/01/17. This showed a uterus measuring 5.6 x 3.2 x 3.3 cm, with a thickened endometrium of 12 mm. The right and left ovary were not visualized. There was no abnormal free fluid. She underwent D&C procedure with Dr. Harolyn Rutherford on 08/05/17. This revealed FIGO grade 1 endometrial adenocarcinoma.   The patient underwent a robotic assisted total hysterectomy BSO and pelvic sentinel lymph node biopsy on 09/20/17 with Dr. Denman George. Final pathology revealed a 1.7 cm, grade 1 endometrioid adenocarcinoma. There was 1.2 cm of 1.8 cm myometrial invasion representing more than one half. No lymphovascular space invasion identified. However, in both sentinel lymph nodes, there were small clusters of metastatic carcinoma each less than 0.2 mm, representing isolated tumor cells. Per Dr. Serita Grit note she recommended adjuvant therapy with whole pelvic radiation and vaginal brachii therapy.  PET scan on 10/10/17 showed interval hysterectomy. No hypermetabolic metastatic disease. Bilateral pulmonary nodules below PET resolution. Hypermetabolism above the right hilum and left infrahilar region, favored to be physiologic/vascular. No well-defined adenopathy in this area. Chest CT scan earlier today confirmed no evidence of pulmonary metastasis.  The patient presents today on Dr. Serita Grit referral for consideration of radiation to the pelvis as well as brachytherapy. The  patient presents with her son.  The patient reports she has lost several pound and she has been trying to drink supplemental beverages. She reports nausea when she first wakes up. She reports mild positional discomfort where "ribs rub on incisional area" from having to lean over to catch her breath. She denies bowel or bladder complaints. The patient is very out of breath during conversation today. She denies use of supplemental oxygen.   PREVIOUS RADIATION THERAPY: No  PAST MEDICAL HISTORY:  has a past medical history of Anemia, Anxiety, Arthritis, Atrial fibrillation (HCC), Atrial flutter (HCC), Atrial tachycardia ([place]), Chest pain on exertion, CHF (congestive heart failure) (HCC), Chronic lower back pain, Chronic nausea, Dizziness, Dyspnea, Endometrial cancer ([place]), Gallstone pancreatitis, Gastritis, GERD (gastroesophageal reflux disease), History of blood transfusion (~ 1948), History of hiatal hernia, HTN (hypertension), Hyperlipemia, Hypothyroidism, Left leg numbness, Obesity, Osteoporosis, PMB (postmenopausal bleeding), Sinus headache, Toe ulcer ([place]), and Vitamin D deficiency.    PAST SURGICAL HISTORY: Past Surgical History:  Procedure Laterality Date  . ATRIAL ABLATION SURGERY  11/17/10   Atrial tachycardia arising from [facility] of the aorta ablated by JA  . BACK SURGERY    . CARDIAC CATHETERIZATION  01/11/2011   Archie Endo 01/12/2011  . CHOLECYSTECTOMY N/A 04/08/2017   Procedure: LAPAROSCOPIC CHOLECYSTECTOMY;  Surgeon: Georganna Skeans, MD;  Location: [facility];  Service: General;  Laterality: N/A;  . FRACTURE SURGERY    . HYSTEROSCOPY W/D&C N/A 08/05/2017   Procedure: DILATATION AND CURETTAGE /HYSTEROSCOPY AND POLYPECTOMY;  Surgeon: Osborne Oman, MD;  Location: [facility];  Service: Gynecology;  Laterality: N/A;  . LUMBAR SPINE SURGERY  ~ 1998  . ROBOTIC ASSISTED TOTAL HYSTERECTOMY WITH BILATERAL SALPINGO OOPHERECTOMY Bilateral 09/20/2017   Procedure: XI ROBOTIC  ASSISTED TOTAL HYSTERECTOMY WITH  BILATERAL SALPINGO OOPHORECTOMY, SENTINAL LYMPH NODE BIOPSY;  Surgeon: Everitt Amber, MD;  Location: [facility];  Service: Gynecology;  Laterality: Bilateral;  . SHOULDER SURGERY Right 1984 X2   Rollene Rotunda 01/19/2011  . WRIST FRACTURE SURGERY Left 1983    FAMILY HISTORY: family history includes Heart attack in her father; Hypertension in her father.  SOCIAL HISTORY:  reports that she has never smoked. She has never used smokeless tobacco. She reports that she does not drink alcohol or use drugs. The patient's children complete grocery shopping for her. She reports unable to complete household chores without taking a break. She is unable to drive or stand for long periods. (chronic issues prior to surgery)  ALLERGIES: Iodinated diagnostic agents and Penicillins  MEDICATIONS:  Current Outpatient Medications  Medication Sig Dispense Refill  . levothyroxine (SYNTHROID, LEVOTHROID) 88 MCG tablet Take 1 tablet (88 mcg total) by mouth daily before breakfast. 30 tablet 0  . metoprolol tartrate (LOPRESSOR) 50 MG tablet Take 1 tablet (50 mg total) by mouth 2 (two) times daily. 60 tablet 0  . warfarin (COUMADIN) 5 MG tablet Resume Sunday, April 14. Take 1/2 tab to 1 tab daily or as directed by [facility] 30 tablet 2  . diphenhydrAMINE (BENADRYL) 50 MG tablet Take 1 tablet (50 mg total) by mouth once for 1 dose. One hour prior to CT scan 1 tablet 0   No current facility-administered medications for this encounter.     REVIEW OF SYSTEMS: A 10+ POINT REVIEW OF SYSTEMS WAS OBTAINED including neurology, dermatology, psychiatry, cardiac, respiratory, lymph, extremities, GI, GU, musculoskeletal, constitutional, reproductive, HEENT. All pertinent positives are noted in the HPI. All others are negative.    PHYSICAL EXAM:  height is 5\' 8"  (1.727 m) and weight is 198 lb 9.6 oz (90.1 kg). Her oral temperature is 97.7 F (36.5 C). Her blood pressure is 147/89 (abnormal) and her pulse is 103 (abnormal). Her respiration  is 24 (abnormal) and oxygen saturation is 98%.   General: Alert and oriented, in no acute distress. Short of breath during conversation throughout the exam. HEENT: Head is normocephalic. Extraocular movements are intact. Oropharynx is clear. Neck: Neck is supple, no palpable cervical or supraclavicular lymphadenopathy. Heart: Regular in rate and rhythm  Chest: Clear to auscultation bilaterally, with no rhonchi, wheezes, or rales. Abdomen: Soft, nontender, nondistended, with no rigidity or guarding. Small scars from laparoscopic procedure are healing well without signs of drainage or infection. Extremities: No cyanosis or edema. Lymphatics: see Neck Exam Skin: No concerning lesions. Musculoskeletal: Mild weakness in left lower extremity for which she uses a four pronged cane. Neurologic: Cranial nerves II through XII are grossly intact. No obvious focalities. Speech is fluent. Coordination is intact. Psychiatric: Judgment and insight are intact. Affect is appropriate. The patient had to be redirected with conversation several times during her evaluation. pelvic exam deferred in light of recent surgery  ECOG = 2   LABORATORY DATA:  Lab Results  Component Value Date   WBC 12.7 (H) 09/28/2017   HGB 15.5 09/28/2017   HCT 44.5 09/28/2017   MCV 95 09/28/2017   PLT 273 09/28/2017   NEUTROABS 7.9 (H) 06/10/2017   Lab Results  Component Value Date   NA 139 09/28/2017   K 4.1 09/28/2017   CL 103 09/28/2017   CO2 19 (L) 09/28/2017   GLUCOSE 115 (H) 09/28/2017   CREATININE 1.15 (H) 09/28/2017   CALCIUM 9.7 09/28/2017      RADIOGRAPHY: Ct Chest  W Contrast  Result Date: 10/26/2017 CLINICAL DATA:  Endometrial cancer. Pulmonary nodules. Chronic shortness of breath. EXAM: CT CHEST WITH CONTRAST TECHNIQUE: Multidetector CT imaging of the chest was performed during intravenous contrast administration. CONTRAST:  25mL OMNIPAQUE IOHEXOL 300 MG/ML  SOLN COMPARISON:  PET 10/10/2017 and CT abdomen  pelvis 10/10/2015. FINDINGS: Cardiovascular: Atherosclerotic calcification of the arterial vasculature, including coronary arteries. Pulmonary arteries and heart are enlarged. No pericardial effusion. Mediastinum/Nodes: Mediastinal lymph nodes are not enlarged by CT size criteria. Right hilar lymph node measures 9 mm and in the left hilum, up to 4 mm. No axillary adenopathy. Esophagus is grossly unremarkable. Large hiatal hernia. Lungs/Pleura: Biapical pleuroparenchymal scarring. Image quality is degraded by respiratory motion. Smudgy nodules along the minor fissure are indicative of subpleural lymph nodes. 6 mm left lower lobe nodule (series 5, image 83), unchanged from 10/10/2015 and considered benign. Calcified granulomas. No pleural fluid. Airway is unremarkable. Upper Abdomen: Subcentimeter low-attenuation lesions in the liver are too small characterize. Cholecystectomy. Visualized portions of the adrenal glands and right kidney are unremarkable. Subcentimeter low-attenuation lesion in the left kidney is too small to characterize. Visualized portions of the spleen, pancreas, stomach and bowel are grossly unremarkable the exception of a large hiatal. No upper abdominal adenopathy. Musculoskeletal: Degenerative changes in the spine and right shoulder. IMPRESSION: 1. Left lower lobe nodule is unchanged from 10/10/2015 and considered benign. Additional nodules along the minor fissure are likely subpleural lymph nodes. 2. Bihilar lymph nodes are not considered pathologically enlarged by CT size criteria, arguing against malignant hypermetabolism on 10/10/2017. 3. Aortic atherosclerosis (ICD10-170.0). Coronary artery calcification. 4. Enlarged pulmonary arteries, indicative of pulmonary arterial hypertension. Electronically Signed   By: Lorin Picket M.D.   On: 10/26/2017 11:17   Nm Pet Image Initial (pi) Skull Base To Thigh  Result Date: 10/10/2017 CLINICAL DATA:  Initial treatment strategy for endometrial  cancer, treatment planning. Metastatic disease to lymph nodes. EXAM: NUCLEAR MEDICINE PET SKULL BASE TO THIGH TECHNIQUE: 9.7 mCi F-18 FDG was injected intravenously. Full-ring PET imaging was performed from the skull base to thigh after the radiotracer. CT data was obtained and used for attenuation correction and anatomic localization. Fasting blood glucose: 101 mg/dl COMPARISON:  Abdominopelvic CT of 04/05/2017. Pelvic ultrasound 08/01/2017. Chest radiograph 06/10/2017. FINDINGS: Mediastinal blood pool activity: SUV max 2.4 NECK: Motion and muscular activity with resultant degradation. No cervical nodal hypermetabolism identified. Laryngeal hypermetabolism is likely physiologic. Incidental CT findings: Cerebral and cerebellar atrophy. Bilateral carotid atherosclerosis. No cervical adenopathy. CHEST: Right hilar hypermetabolism is without well-defined CT correlate. This measures a S.U.V. max of 4.9 on image 70/4. Similar findings in left infrahilar region, including at a S.U.V. max of 4.5 on image 75/4. Incidental CT findings: Mild cardiomegaly with multivessel coronary artery atherosclerosis. A large hiatal hernia with greater than half of the stomach positioned within the lower chest. Pulmonary artery enlargement, outflow tract 3.1 cm. 6 mm right middle or right upper lobe pulmonary nodule on image 34/8. Volume loss in the right lower lobe dependently. 7 mm left lower lobe pulmonary nodule on image 44/8. Smaller bilateral pulmonary nodules. ABDOMEN/PELVIS: Abdominal wall foci of hypermetabolism and subcutaneous increased density may be postoperative or secondary to injection sites. Vague hypermetabolism along the left external iliac vasculature is without correlate adenopathy and may correspond to residual ovarian tissue in this area. Example image 178/4. Incidental CT findings: Cholecystectomy. Dense aortic atherosclerosis. Interval hysterectomy. Small amount of fluid in the pelvis may be postoperative. Equivocal  air within the urinary bladder  including on image 193/4. SKELETON: No abnormal marrow activity. Incidental CT findings: Right hip osteoarthritis. Lumbar spine fixation. Mild osteopenia. IMPRESSION: 1. Interval hysterectomy. No hypermetabolic metastatic disease identified. 2. Bilateral pulmonary nodules, below PET resolution. Consider dedicated chest CT to serve as a baseline for follow-up. In addition, follow-up diagnostic chest CT at 3 months should be considered. 3. Hypermetabolism about the right hilum and left infrahilar region, favored to be physiologic/vascular. No well-defined adenopathy in this area. This would also be better evaluated on dedicated chest CT (preferably with contrast). 4. Incidental findings, including hiatal hernia, possible pulmonary arterial hypertension. 5. Coronary artery atherosclerosis. Aortic Atherosclerosis (ICD10-I70.0). 6. Small amount of free pelvic fluid, likely postoperative. 7. Equivocal air within the urinary bladder. Correlate with recent instrumentation and any symptoms of cystitis. Electronically Signed   By: Abigail Miyamoto M.D.   On: 10/10/2017 16:51      IMPRESSION: Stage IB , grade I endometrioid adenocarcinoma with isolated tumor cells in multiple SLN. Given the  pathologic findings,  I would agree with Dr. Serita Grit recommendation for whole pelvic radiation therapy followed by vaginal brachytherapy. We discussed the course of treatment, side effects, and potential long term toxicities of this therapy with the patient and her son. She appears to understand and wishes to proceed with treatment. I anticipate 25 treatments of external beam radiation to the whole pelvis followed by 3 treatments of brachytherapy directed to the vaginal cuff.  I am recommending intensity modulated radiation therapy for her external beam treatments to limit the dose to the small bowel and pelvic bone marrow.  PLAN: CT simulation and treatment planning will be scheduled for approximately 6  weeks out from her surgery.     ------------------------------------------------  Blair Promise, PhD, MD   This document serves as a record of services personally performed by Gery Pray, MD. It was created on his behalf by Bethann Humble, a trained medical scribe. The creation of this record is based on the scribe's personal observations and the provider's statements to them. This document has been checked and approved by the attending provider.

## 2017-10-28 ENCOUNTER — Encounter: Payer: Self-pay | Admitting: *Deleted

## 2017-10-28 ENCOUNTER — Telehealth: Payer: Self-pay | Admitting: Oncology

## 2017-10-28 NOTE — Progress Notes (Signed)
[place] Psychosocial Distress Screening Clinical Social Work  Clinical Social Work was referred by distress screening protocol and referred by [employer] Charlotte Castro.  The patient scored a 6 on the Psychosocial Distress Thermometer which indicates moderate distress. Clinical Social Worker contacted patient to assess for distress and other psychosocial needs. Charlotte Castro shared she lives alone.  She identified her son Charlotte Castro as her primary support and daughter whom lives in [place].  Patient stated her son Charlotte Castro works and she does not want to "burden him" with transporting her to appointments.  CSW reviewed transportation resources with patient and plans to follow up once radiation is scheduled. Charlotte Castro shared a source of joy is caring for stray cats (through some sort of animal program).  She stated she has also enjoyed caring for animals.  CSW attempted to contact patient's son Charlotte Castro, left voicemail to return call.  ONCBCN DISTRESS SCREENING 10/26/2017  Screening Type Initial Screening  Distress experienced in past week (1-10) 6  Practical problem type Work/school  Emotional problem type Nervousness/Anxiety  Information Concerns Type Lack of info about diagnosis  Physical Problem type Pain;Nausea/vomiting;Breathing    Charlotte Castro, MSW, LCSW, OSW-C Clinical Social Worker [facility] [phone]

## 2017-10-31 NOTE — Telephone Encounter (Signed)
Entered in error

## 2017-11-01 ENCOUNTER — Other Ambulatory Visit: Payer: Self-pay | Admitting: Radiation Oncology

## 2017-11-01 DIAGNOSIS — C541 Malignant neoplasm of endometrium: Secondary | ICD-10-CM

## 2017-11-02 ENCOUNTER — Telehealth: Payer: Self-pay

## 2017-11-02 ENCOUNTER — Telehealth: Payer: Self-pay | Admitting: Oncology

## 2017-11-02 NOTE — Telephone Encounter (Signed)
Herbie Drape regarding her CT [facility] appointment next week.  She said she does not have transportation arranged for the appointment and would like to set it up.  Advised her that Lauren, CSW will be contacting her to discuss transportation options with the [place].

## 2017-11-03 ENCOUNTER — Ambulatory Visit: Payer: Medicare Other

## 2017-11-04 ENCOUNTER — Encounter: Payer: Self-pay | Admitting: Oncology

## 2017-11-04 ENCOUNTER — Encounter: Payer: Self-pay | Admitting: *Deleted

## 2017-11-04 NOTE — Progress Notes (Signed)
[place] Work  Clinical Social Work was referred by [employer] navigator for assessment of psychosocial needs.  Clinical Social Worker contacted patient by phone  to offer support and assess for needs.  [name] reported her son will bring her to upcoming CT Sim appointment, but she'd like transportation resources for treatment days.  CSW and patient discussed several options and patient felt [facility] volunteer drivers would be best fit for her.  CSW explained process- CSW is unable to meet with patient at [facility] appointment, however, wrote out instructions for patient and family.  Below is the information provided to patient at [facility] appointment:   Charlotte Castro  Social Worker will request your first week of rides using Charlotte Castro to Recovery Program.    They will contact you to let you know if they have found a volunteer driver to bring you to treatment.  When they call, it will show up as a 1-800 phone number, [place]. If you are unable to get a volunteer driver, please call Charlotte Castro (Education officer, museum) at [phone].  Enclosed is information on [place] to Recovery Program and Charlotte Castro's contact information.     Kennith Center, LCSW  Clinical Social Worker [facility]

## 2017-11-04 NOTE — Progress Notes (Signed)
  Oncology Nurse Navigator Documentation  Navigator Location: [facility] (11/04/17 1000)   )Navigator Encounter Type: Other (11/04/17 1000)                         Barriers/Navigation Needs: Transportation (11/04/17 1000) Education: Transport During Treatment (11/04/17 1000)   Referrals: Social Work (11/04/17 1000)          Acuity: Level 2 (11/04/17 1000)   Acuity Level 2: Ongoing guidance and education throughout treatment as needed (11/04/17 1000)     Time Spent with Patient: 15 (11/04/17 1000)   Called Lauren, CSW who will check with patient today about transportation.

## 2017-11-08 ENCOUNTER — Ambulatory Visit
Admission: RE | Admit: 2017-11-08 | Discharge: 2017-11-08 | Disposition: A | Discharge status: Home / Self Care | Payer: Medicare Other | Source: Ambulatory Visit | Attending: Radiation Oncology | Admitting: Radiation Oncology

## 2017-11-08 ENCOUNTER — Other Ambulatory Visit: Payer: Self-pay | Admitting: Oncology

## 2017-11-08 ENCOUNTER — Encounter: Payer: Self-pay | Admitting: Oncology

## 2017-11-08 DIAGNOSIS — C541 Malignant neoplasm of endometrium: Secondary | ICD-10-CM

## 2017-11-08 DIAGNOSIS — Z510 Encounter for antineoplastic radiation therapy: Secondary | ICD-10-CM | POA: Diagnosis not present

## 2017-11-08 LAB — COMPREHENSIVE METABOLIC PANEL
ALBUMIN: 3.8 g/dL (ref 3.5–5.0)
ALT: 15 U/L (ref 14–54)
AST: 18 U/L (ref 15–41)
Alkaline Phosphatase: 69 U/L (ref 38–126)
Anion gap: 11 (ref 5–15)
BUN: 30 mg/dL — AB (ref 6–20)
CHLORIDE: 111 mmol/L (ref 101–111)
CO2: 21 mmol/L — AB (ref 22–32)
Calcium: 9.6 mg/dL (ref 8.9–10.3)
Creatinine, Ser: 0.89 mg/dL (ref 0.44–1.00)
GFR calc Af Amer: >60 mL/min (ref >60)
GFR calc non Af Amer: 59 mL/min — ABNORMAL LOW (ref >60)
GLUCOSE: 95 mg/dL (ref 65–99)
POTASSIUM: 4.6 mmol/L (ref 3.5–5.1)
Sodium: 143 mmol/L (ref 135–145)
Total Bilirubin: 0.9 mg/dL (ref 0.3–1.2)
Total Protein: 7 g/dL (ref 6.5–8.1)

## 2017-11-08 NOTE — Progress Notes (Addendum)
  Radiation Oncology         ([phone]) [phone] ________________________________  Name: Charlotte Castro MRN: [id number]  Date: 11/08/2017  DOB: 1932-07-20  SIMULATION AND TREATMENT PLANNING NOTE   DIAGNOSIS:   Stage IB, grade I endometrioid adenocarcinoma, (with isolated tumor cells in the SLN's)    NARRATIVE:  The patient was brought to the [facility].  Identity was confirmed.  All relevant records and images related to the planned course of therapy were reviewed.  The patient freely provided informed written consent to proceed with treatment after reviewing the details related to the planned course of therapy. The consent form was witnessed and verified by the simulation staff.  Then, the patient was set-up in a stable reproducible  supine position for radiation therapy.  CT images were obtained.  Surface markings were placed.  The CT images were loaded into the planning software.  Then the target and avoidance structures were contoured.  Treatment planning then occurred.  The radiation prescription was entered and confirmed.  Then, I designed and supervised the construction of a total of 2 medically necessary complex treatment devices.  I have requested : Intensity Modulated Radiotherapy (IMRT) is medically necessary for this case for the following reason:  Small bowel sparing..  I have ordered:dose calc.  PLAN:  The patient will receive 45 Gy in 25 fractions directed at the pelvis region followed by 3 high-dose rate intracavitary brachytherapy treatments directed at the vaginal cuff.  -----------------------------------  Blair Promise, PhD, MD

## 2017-11-09 ENCOUNTER — Encounter: Payer: Self-pay | Admitting: *Deleted

## 2017-11-09 NOTE — Progress Notes (Signed)
[place] Work  Clinical Social Work scheduled rides through [employer] to Recovery program 11/14/17-11/25/17 (first two weeks of treatment).  CSW contacted patient and patient's son and explained [facility] process.  [facility] will contact patient directly to confirm rides.  CSW notified Elmo Putt, navigator, and [facility].     Kennith Center, LCSW  Clinical Social Worker [facility]

## 2017-11-10 DIAGNOSIS — Z510 Encounter for antineoplastic radiation therapy: Secondary | ICD-10-CM | POA: Diagnosis not present

## 2017-11-14 ENCOUNTER — Ambulatory Visit
Admission: RE | Admit: 2017-11-14 | Discharge: 2017-11-14 | Disposition: A | Discharge status: Home / Self Care | Payer: Medicare Other | Source: Ambulatory Visit | Attending: Radiation Oncology | Admitting: Radiation Oncology

## 2017-11-14 ENCOUNTER — Encounter: Payer: Self-pay | Admitting: Oncology

## 2017-11-14 DIAGNOSIS — Z510 Encounter for antineoplastic radiation therapy: Secondary | ICD-10-CM | POA: Insufficient documentation

## 2017-11-14 DIAGNOSIS — C541 Malignant neoplasm of endometrium: Secondary | ICD-10-CM | POA: Diagnosis not present

## 2017-11-14 NOTE — Progress Notes (Signed)
  Radiation Oncology         [phone] ________________________________  Name: Charlotte Castro MRN: [id number]  Date: 11/14/2017  DOB: 1933-05-14  Simulation Verification Note    ICD-10-CM   1. Endometrial cancer ([place]) C54.1     Status: outpatient  NARRATIVE: The patient was brought to the treatment unit and placed in the planned treatment position. The clinical setup was verified. Then port films were obtained and uploaded to the radiation oncology medical record software.  The treatment beams were carefully compared against the planned radiation fields. The position location and shape of the radiation fields was reviewed. They targeted volume of tissue appears to be appropriately covered by the radiation beams. Organs at risk appear to be excluded as planned.  Based on my personal review, I approved the simulation verification. The patient's treatment will proceed as planned.  -----------------------------------  Blair Promise, PhD, MD

## 2017-11-15 ENCOUNTER — Ambulatory Visit
Admission: RE | Admit: 2017-11-15 | Discharge: 2017-11-15 | Disposition: A | Discharge status: Home / Self Care | Payer: Medicare Other | Source: Ambulatory Visit | Attending: Radiation Oncology | Admitting: Radiation Oncology

## 2017-11-15 ENCOUNTER — Telehealth: Payer: Self-pay | Admitting: *Deleted

## 2017-11-15 DIAGNOSIS — Z510 Encounter for antineoplastic radiation therapy: Secondary | ICD-10-CM | POA: Diagnosis not present

## 2017-11-15 NOTE — Telephone Encounter (Signed)
Called patient to inform of appt. with Dr. Paulita Fujita on 12-01-17 - arrival time - 1:30 pm, address- [address] , spoke with patient and she is aware of this appt.

## 2017-11-16 ENCOUNTER — Ambulatory Visit
Admission: RE | Admit: 2017-11-16 | Discharge: 2017-11-16 | Disposition: A | Discharge status: Home / Self Care | Payer: Medicare Other | Source: Ambulatory Visit | Attending: Radiation Oncology | Admitting: Radiation Oncology

## 2017-11-16 DIAGNOSIS — Z510 Encounter for antineoplastic radiation therapy: Secondary | ICD-10-CM | POA: Diagnosis not present

## 2017-11-17 ENCOUNTER — Ambulatory Visit
Admission: RE | Admit: 2017-11-17 | Discharge: 2017-11-17 | Disposition: A | Discharge status: Home / Self Care | Payer: Medicare Other | Source: Ambulatory Visit | Attending: Radiation Oncology | Admitting: Radiation Oncology

## 2017-11-17 DIAGNOSIS — Z510 Encounter for antineoplastic radiation therapy: Secondary | ICD-10-CM | POA: Diagnosis not present

## 2017-11-18 ENCOUNTER — Ambulatory Visit
Admission: RE | Admit: 2017-11-18 | Discharge: 2017-11-18 | Disposition: A | Discharge status: Home / Self Care | Payer: Medicare Other | Source: Ambulatory Visit | Attending: Radiation Oncology | Admitting: Radiation Oncology

## 2017-11-18 DIAGNOSIS — Z510 Encounter for antineoplastic radiation therapy: Secondary | ICD-10-CM | POA: Diagnosis not present

## 2017-11-21 ENCOUNTER — Ambulatory Visit
Admission: RE | Admit: 2017-11-21 | Discharge: 2017-11-21 | Disposition: A | Discharge status: Home / Self Care | Payer: Medicare Other | Source: Ambulatory Visit | Attending: Radiation Oncology | Admitting: Radiation Oncology

## 2017-11-21 DIAGNOSIS — Z510 Encounter for antineoplastic radiation therapy: Secondary | ICD-10-CM | POA: Diagnosis not present

## 2017-11-22 ENCOUNTER — Ambulatory Visit
Admission: RE | Admit: 2017-11-22 | Discharge: 2017-11-22 | Disposition: A | Discharge status: Home / Self Care | Payer: Medicare Other | Source: Ambulatory Visit | Attending: Radiation Oncology | Admitting: Radiation Oncology

## 2017-11-22 DIAGNOSIS — C541 Malignant neoplasm of endometrium: Secondary | ICD-10-CM

## 2017-11-22 DIAGNOSIS — Z510 Encounter for antineoplastic radiation therapy: Secondary | ICD-10-CM | POA: Diagnosis not present

## 2017-11-22 LAB — CBC WITH DIFFERENTIAL (CANCER CENTER ONLY)
Basophils Absolute: 0 10*3/uL (ref 0.0–0.1)
Basophils Relative: 1 %
EOS ABS: 0.4 10*3/uL (ref 0.0–0.5)
EOS PCT: 8 %
HCT: 40.5 % (ref 34.8–46.6)
Hemoglobin: 14 g/dL (ref 11.6–15.9)
LYMPHS ABS: 1.5 10*3/uL (ref 0.9–3.3)
LYMPHS PCT: 32 %
MCH: 33.2 pg (ref 25.1–34.0)
MCHC: 34.5 g/dL (ref 31.5–36.0)
MCV: 96.2 fL (ref 79.5–101.0)
MONOS PCT: 9 %
Monocytes Absolute: 0.4 10*3/uL (ref 0.1–0.9)
Neutro Abs: 2.5 10*3/uL (ref 1.5–6.5)
Neutrophils Relative %: 50 %
PLATELETS: 174 10*3/uL (ref 145–400)
RBC: 4.21 MIL/uL (ref 3.70–5.45)
RDW: 14.3 % (ref 11.2–14.5)
WBC Count: 4.8 10*3/uL (ref 3.9–10.3)

## 2017-11-22 LAB — BASIC METABOLIC PANEL - CANCER CENTER ONLY
Anion gap: 10 (ref 3–11)
BUN: 19 mg/dL (ref 7–26)
CHLORIDE: 108 mmol/L (ref 98–109)
CO2: 24 mmol/L (ref 22–29)
CREATININE: 0.81 mg/dL (ref 0.60–1.10)
Calcium: 10.3 mg/dL (ref 8.4–10.4)
GFR, Est AFR Am: >60 mL/min (ref >60)
GFR, Estimated: >60 mL/min (ref >60)
GLUCOSE: 94 mg/dL (ref 70–140)
POTASSIUM: 3.9 mmol/L (ref 3.5–5.1)
SODIUM: 142 mmol/L (ref 136–145)

## 2017-11-23 ENCOUNTER — Encounter: Payer: Self-pay | Admitting: *Deleted

## 2017-11-23 ENCOUNTER — Ambulatory Visit
Admission: RE | Admit: 2017-11-23 | Discharge: 2017-11-23 | Disposition: A | Discharge status: Home / Self Care | Payer: Medicare Other | Source: Ambulatory Visit | Attending: Radiation Oncology | Admitting: Radiation Oncology

## 2017-11-23 DIAGNOSIS — Z510 Encounter for antineoplastic radiation therapy: Secondary | ICD-10-CM | POA: Diagnosis not present

## 2017-11-23 NOTE — Progress Notes (Signed)
[place] Clinical Social Work  Clinical Social Work requested transportation rides through [employer] for remainder of radiation treatments.      Kennith Center, LCSW  Clinical Social Worker [facility]

## 2017-11-24 ENCOUNTER — Ambulatory Visit
Admission: RE | Admit: 2017-11-24 | Discharge: 2017-11-24 | Disposition: A | Discharge status: Home / Self Care | Payer: Medicare Other | Source: Ambulatory Visit | Attending: Radiation Oncology | Admitting: Radiation Oncology

## 2017-11-24 DIAGNOSIS — Z510 Encounter for antineoplastic radiation therapy: Secondary | ICD-10-CM | POA: Diagnosis not present

## 2017-11-25 ENCOUNTER — Ambulatory Visit
Admission: RE | Admit: 2017-11-25 | Discharge: 2017-11-25 | Disposition: A | Discharge status: Home / Self Care | Payer: Medicare Other | Source: Ambulatory Visit | Attending: Radiation Oncology | Admitting: Radiation Oncology

## 2017-11-25 DIAGNOSIS — Z510 Encounter for antineoplastic radiation therapy: Secondary | ICD-10-CM | POA: Diagnosis not present

## 2017-11-25 NOTE — Progress Notes (Signed)
Pt presented to clinic for nurse check at request of Dr. Sondra Come to check effectiveness of prescribed anti-diarrheal medication. Pt states that the medication is helping, but still reports multiple loose stools per day. Pt reports feeling better since visit with Dr. Sondra Come on Tuesday, 11/22/17. Pt denies distress and states she is ready to return home after today's treatment. Encouraged pt to call clinic with any questions/concerns. Pt verbalized understanding. BP 131/66.

## 2017-11-28 ENCOUNTER — Ambulatory Visit
Admission: RE | Admit: 2017-11-28 | Discharge: 2017-11-28 | Disposition: A | Discharge status: Home / Self Care | Payer: Medicare Other | Source: Ambulatory Visit | Attending: Radiation Oncology | Admitting: Radiation Oncology

## 2017-11-28 DIAGNOSIS — Z510 Encounter for antineoplastic radiation therapy: Secondary | ICD-10-CM | POA: Diagnosis not present

## 2017-11-29 ENCOUNTER — Ambulatory Visit: Payer: Medicare Other

## 2017-11-30 ENCOUNTER — Ambulatory Visit
Admission: RE | Admit: 2017-11-30 | Discharge: 2017-11-30 | Disposition: A | Discharge status: Home / Self Care | Payer: Medicare Other | Source: Ambulatory Visit | Attending: Radiation Oncology | Admitting: Radiation Oncology

## 2017-11-30 DIAGNOSIS — Z510 Encounter for antineoplastic radiation therapy: Secondary | ICD-10-CM | POA: Diagnosis not present

## 2017-12-01 ENCOUNTER — Ambulatory Visit
Admission: RE | Admit: 2017-12-01 | Discharge: 2017-12-01 | Disposition: A | Discharge status: Home / Self Care | Payer: Medicare Other | Source: Ambulatory Visit | Attending: Radiation Oncology | Admitting: Radiation Oncology

## 2017-12-01 DIAGNOSIS — Z510 Encounter for antineoplastic radiation therapy: Secondary | ICD-10-CM | POA: Diagnosis not present

## 2017-12-02 ENCOUNTER — Ambulatory Visit
Admission: RE | Admit: 2017-12-02 | Discharge: 2017-12-02 | Disposition: A | Discharge status: Home / Self Care | Payer: Medicare Other | Source: Ambulatory Visit | Attending: Radiation Oncology | Admitting: Radiation Oncology

## 2017-12-02 DIAGNOSIS — Z510 Encounter for antineoplastic radiation therapy: Secondary | ICD-10-CM | POA: Diagnosis not present

## 2017-12-02 NOTE — Progress Notes (Signed)
Received following email from [place] in L3: Dr. Sondra Come,  I know that I e-mailed you a couple of weeks ago about Charlotte Castro ([id number]) having trouble with treatment, but she has been reporting "passing out" at home and having trouble breathing for the past couple of days. We have offered to send her around, but she says she feels like that wouldn't help. Just wanted to touch base with you and let you know what was going on and see if you would like for [place] to do anything special.  Thanks, Candace (L3)  Attempted to see pt in treatment area but pt had left area despite therapists offering to bring pt to nursing area. Saw pt in [place] lobby. Pt reports fatigue where "it feels like I'm just going to collapse". Pt taking white, large pill for diarrhea that pt states is helping. States she is eating and drinking "all the time". Pt states that "my son and daughter just don't even seem to realize what I'm going through. They say jokes about how much I eat. They just don't know what I'm going through."  Offered to escort pt to [facility] and/or ED. Pt adamantly refused, stating she didn't want to bother her ride. When gentleman driving pt was asked if he had time, pt stated "no, you don't" and closed her car door. Pt left with driver after multiple refusals to see provider and/or ED for above described symptoms. Loma Sousa, RN BSN

## 2017-12-05 ENCOUNTER — Ambulatory Visit
Admission: RE | Admit: 2017-12-05 | Discharge: 2017-12-05 | Disposition: A | Discharge status: Home / Self Care | Payer: Medicare Other | Source: Ambulatory Visit | Attending: Radiation Oncology | Admitting: Radiation Oncology

## 2017-12-05 DIAGNOSIS — Z510 Encounter for antineoplastic radiation therapy: Secondary | ICD-10-CM | POA: Diagnosis not present

## 2017-12-06 ENCOUNTER — Ambulatory Visit
Admission: RE | Admit: 2017-12-06 | Discharge: 2017-12-06 | Disposition: A | Discharge status: Home / Self Care | Payer: Medicare Other | Source: Ambulatory Visit | Attending: Radiation Oncology | Admitting: Radiation Oncology

## 2017-12-06 DIAGNOSIS — Z510 Encounter for antineoplastic radiation therapy: Secondary | ICD-10-CM | POA: Diagnosis not present

## 2017-12-07 ENCOUNTER — Ambulatory Visit
Admission: RE | Admit: 2017-12-07 | Discharge: 2017-12-07 | Disposition: A | Discharge status: Home / Self Care | Payer: Medicare Other | Source: Ambulatory Visit | Attending: Radiation Oncology | Admitting: Radiation Oncology

## 2017-12-07 DIAGNOSIS — Z510 Encounter for antineoplastic radiation therapy: Secondary | ICD-10-CM | POA: Diagnosis not present

## 2017-12-08 ENCOUNTER — Ambulatory Visit
Admission: RE | Admit: 2017-12-08 | Discharge: 2017-12-08 | Disposition: A | Discharge status: Home / Self Care | Payer: Medicare Other | Source: Ambulatory Visit | Attending: Radiation Oncology | Admitting: Radiation Oncology

## 2017-12-08 DIAGNOSIS — Z510 Encounter for antineoplastic radiation therapy: Secondary | ICD-10-CM | POA: Diagnosis not present

## 2017-12-09 ENCOUNTER — Ambulatory Visit
Admission: RE | Admit: 2017-12-09 | Discharge: 2017-12-09 | Disposition: A | Discharge status: Home / Self Care | Payer: Medicare Other | Source: Ambulatory Visit | Attending: Radiation Oncology | Admitting: Radiation Oncology

## 2017-12-09 DIAGNOSIS — Z510 Encounter for antineoplastic radiation therapy: Secondary | ICD-10-CM | POA: Diagnosis not present

## 2017-12-12 ENCOUNTER — Ambulatory Visit
Admission: RE | Admit: 2017-12-12 | Discharge: 2017-12-12 | Disposition: A | Discharge status: Home / Self Care | Payer: Medicare Other | Source: Ambulatory Visit | Attending: Radiation Oncology | Admitting: Radiation Oncology

## 2017-12-12 DIAGNOSIS — C541 Malignant neoplasm of endometrium: Secondary | ICD-10-CM | POA: Insufficient documentation

## 2017-12-12 DIAGNOSIS — Z510 Encounter for antineoplastic radiation therapy: Secondary | ICD-10-CM | POA: Diagnosis not present

## 2017-12-13 ENCOUNTER — Ambulatory Visit
Admission: RE | Admit: 2017-12-13 | Discharge: 2017-12-13 | Disposition: A | Discharge status: Home / Self Care | Payer: Medicare Other | Source: Ambulatory Visit | Attending: Radiation Oncology | Admitting: Radiation Oncology

## 2017-12-13 DIAGNOSIS — Z510 Encounter for antineoplastic radiation therapy: Secondary | ICD-10-CM | POA: Diagnosis not present

## 2017-12-14 ENCOUNTER — Ambulatory Visit
Admission: RE | Admit: 2017-12-14 | Discharge: 2017-12-14 | Disposition: A | Discharge status: Home / Self Care | Payer: Medicare Other | Source: Ambulatory Visit | Attending: Radiation Oncology | Admitting: Radiation Oncology

## 2017-12-14 DIAGNOSIS — Z510 Encounter for antineoplastic radiation therapy: Secondary | ICD-10-CM | POA: Diagnosis not present

## 2017-12-16 ENCOUNTER — Ambulatory Visit
Admission: RE | Admit: 2017-12-16 | Discharge: 2017-12-16 | Disposition: A | Discharge status: Home / Self Care | Payer: Medicare Other | Source: Ambulatory Visit | Attending: Radiation Oncology | Admitting: Radiation Oncology

## 2017-12-16 DIAGNOSIS — Z510 Encounter for antineoplastic radiation therapy: Secondary | ICD-10-CM | POA: Diagnosis not present

## 2017-12-19 ENCOUNTER — Ambulatory Visit: Payer: Medicare Other

## 2017-12-19 ENCOUNTER — Ambulatory Visit
Admission: RE | Admit: 2017-12-19 | Discharge: 2017-12-19 | Disposition: A | Discharge status: Home / Self Care | Payer: Medicare Other | Source: Ambulatory Visit | Attending: Radiation Oncology | Admitting: Radiation Oncology

## 2017-12-19 DIAGNOSIS — Z510 Encounter for antineoplastic radiation therapy: Secondary | ICD-10-CM | POA: Diagnosis not present

## 2017-12-20 ENCOUNTER — Encounter: Payer: Self-pay | Admitting: Radiation Oncology

## 2017-12-20 ENCOUNTER — Ambulatory Visit
Admission: RE | Admit: 2017-12-20 | Discharge: 2017-12-20 | Disposition: A | Discharge status: Home / Self Care | Payer: Medicare Other | Source: Ambulatory Visit | Attending: Radiation Oncology | Admitting: Radiation Oncology

## 2017-12-20 DIAGNOSIS — Z510 Encounter for antineoplastic radiation therapy: Secondary | ICD-10-CM | POA: Diagnosis not present

## 2018-01-02 ENCOUNTER — Telehealth: Payer: Self-pay | Admitting: *Deleted

## 2018-01-02 NOTE — Telephone Encounter (Signed)
Called patient to remind of [facility], lvm for a return call 

## 2018-01-03 ENCOUNTER — Ambulatory Visit
Admission: RE | Admit: 2018-01-03 | Discharge: 2018-01-03 | Disposition: A | Discharge status: Home / Self Care | Payer: Medicare Other | Source: Ambulatory Visit | Attending: Radiation Oncology | Admitting: Radiation Oncology

## 2018-01-03 ENCOUNTER — Encounter: Payer: Self-pay | Admitting: Oncology

## 2018-01-03 ENCOUNTER — Other Ambulatory Visit: Payer: Self-pay

## 2018-01-03 ENCOUNTER — Encounter: Payer: Self-pay | Admitting: Radiation Oncology

## 2018-01-03 VITALS — BP 116/82 | HR 96 | Temp 98.3°F | Resp 20 | Ht 68.0 in | Wt 189.4 lb

## 2018-01-03 DIAGNOSIS — Z79890 Hormone replacement therapy (postmenopausal): Secondary | ICD-10-CM | POA: Insufficient documentation

## 2018-01-03 DIAGNOSIS — Z880 Allergy status to penicillin: Secondary | ICD-10-CM | POA: Diagnosis not present

## 2018-01-03 DIAGNOSIS — Z923 Personal history of irradiation: Secondary | ICD-10-CM | POA: Insufficient documentation

## 2018-01-03 DIAGNOSIS — C541 Malignant neoplasm of endometrium: Secondary | ICD-10-CM | POA: Insufficient documentation

## 2018-01-03 DIAGNOSIS — Z7901 Long term (current) use of anticoagulants: Secondary | ICD-10-CM | POA: Insufficient documentation

## 2018-01-03 DIAGNOSIS — Z510 Encounter for antineoplastic radiation therapy: Secondary | ICD-10-CM | POA: Insufficient documentation

## 2018-01-03 DIAGNOSIS — R300 Dysuria: Secondary | ICD-10-CM

## 2018-01-03 DIAGNOSIS — R197 Diarrhea, unspecified: Secondary | ICD-10-CM | POA: Diagnosis not present

## 2018-01-03 DIAGNOSIS — Z79899 Other long term (current) drug therapy: Secondary | ICD-10-CM | POA: Diagnosis not present

## 2018-01-03 LAB — URINALYSIS, COMPLETE (UACMP) WITH MICROSCOPIC
BILIRUBIN URINE: NEGATIVE
Glucose, UA: NEGATIVE mg/dL
Ketones, ur: NEGATIVE mg/dL
Nitrite: NEGATIVE
Protein, ur: 30 mg/dL — AB
RBC / HPF: >50 RBC/hpf — ABNORMAL HIGH (ref 0–5)
SPECIFIC GRAVITY, URINE: 1.029 (ref 1.005–1.030)
pH: 5 (ref 5.0–8.0)

## 2018-01-03 MED ORDER — DIPHENOXYLATE-ATROPINE 2.5-0.025 MG PO TABS: 1.0000 | ORAL_TABLET | Freq: Four times a day (QID) | ORAL | 0 refills | Status: DC | PRN

## 2018-01-03 NOTE — Progress Notes (Signed)
  Radiation Oncology         [phone] ________________________________  Name: Charlotte Castro MRN: [id number]  Date: 01/03/2018  DOB: 1932-08-08  Vaginal Brachytherapy Procedure Note  CC: Leeroy Cha, MD Everitt Amber, MD    ICD-10-CM   1. Endometrial cancer ([place]) C54.1     Diagnosis:  Stage IB, grade I endometrioid adenocarcinoma,(with isolated tumor cells in the SLN's)    Radiation Treatment Dates: external beam , 6/3-12/20/17 ,45 Gy to pelvis  Narrative: She returns today for vaginal cylinder fitting. She continues to have some problems with diarrhea and I will give her a prescription for Lomotil. She also has noticed some dysuria and increased frequency during the day and at night. She will present to the lab later today for urinalysis culture and sensitivity.  ALLERGIES: is allergic to iodinated diagnostic agents and penicillins.  Meds: Current Outpatient Medications  Medication Sig Dispense Refill  . levothyroxine (SYNTHROID, LEVOTHROID) 88 MCG tablet Take 1 tablet (88 mcg total) by mouth daily before breakfast. 30 tablet 0  . metoprolol tartrate (LOPRESSOR) 50 MG tablet Take 1 tablet (50 mg total) by mouth 2 (two) times daily. 60 tablet 0  . warfarin (COUMADIN) 5 MG tablet Resume Sunday, April 14. Take 1/2 tab to 1 tab daily or as directed by Coumadin Clinic 30 tablet 2  . diphenhydrAMINE (BENADRYL) 50 MG tablet Take 1 tablet (50 mg total) by mouth once for 1 dose. One hour prior to CT scan 1 tablet 0  . diphenoxylate-atropine (LOMOTIL) 2.5-0.025 MG tablet Take 1 tablet by mouth 4 (four) times daily as needed for diarrhea or loose stools. 30 tablet 0   No current facility-administered medications for this encounter.     Physical Findings: The patient is in no acute distress. Patient is alert and oriented.  height is 5\' 8"  (1.727 m) and weight is 189 lb 6.4 oz (85.9 kg). Her oral temperature is 98.3 F (36.8 C). Her blood pressure is 116/82 and her pulse is  96. Her respiration is 20 and oxygen saturation is 99%.   No palpable cervical, supraclavicular or axillary lymphoadenopathy. The heart has a regular rate and rhythm. The lungs are clear to auscultation. Abdomen soft and non-tender.  On pelvic examination the external genitalia were unremarkable except for mild erythema. A speculum exam was performed. Vaginal cuff intact, no mucosal lesions. On bimanual exam there were no pelvic masses appreciated.  Lab Findings: Lab Results  Component Value Date   WBC 4.8 11/22/2017   HGB 14.0 11/22/2017   HCT 40.5 11/22/2017   MCV 96.2 11/22/2017   PLT 174 11/22/2017    Radiographic Findings: No results found.  Impression:  Stage IB, grade I endometrioid adenocarcinoma,(with isolated tumor cells in the SLN's)    Patient was fitted for a vaginal cylinder. The patient will be treated with a 2.5 cm diameter cylinder with a treatment length of 3.0 cm. This distended the vaginal vault without undue discomfort. The patient tolerated the procedure well.  The patient was successfully fitted for a vaginal cylinder. The patient is appropriate to begin vaginal brachytherapy.   Plan: The patient will proceed with CT simulation and vaginal brachytherapy today.    _______________________________   Blair Promise, PhD, MD

## 2018-01-03 NOTE — Progress Notes (Signed)
Ms. Rogalski is here for her New HDR. States that she has some dysuria. States that she has  Diarrhea. Denies any vaginal or rectal bleeding. Denies any vaginal discharge.States that she has some nausea. Reports nocturia x6.  Vitals:   01/03/18 0813  BP: 116/82  Pulse: 96  Resp: 20  Temp: 98.3 F (36.8 C)  TempSrc: Oral  SpO2: 99%  Weight: 189 lb 6.4 oz (85.9 kg)  Height: 5\' 8"  (1.727 m)   Wt Readings from Last 3 Encounters:  01/03/18 189 lb 6.4 oz (85.9 kg)  10/26/17 198 lb 9.6 oz (90.1 kg)  10/21/17 191 lb 11.2 oz (87 kg)

## 2018-01-03 NOTE — Progress Notes (Signed)
  Radiation Oncology         [phone] ________________________________  Name: DALYAH PLA MRN: [id number]  Date: 01/03/2018  DOB: [birth year]  SIMULATION AND TREATMENT PLANNING NOTE HDR BRACHYTHERAPY  DIAGNOSIS:  Stage IB, grade I endometrioid adenocarcinoma,(with isolated tumor cells in the SLN's)    NARRATIVE:  The patient was brought to the [facility].  Identity was confirmed.  All relevant records and images related to the planned course of therapy were reviewed.  The patient freely provided informed written consent to proceed with treatment after reviewing the details related to the planned course of therapy. The consent form was witnessed and verified by the simulation staff.  Then, the patient was set-up in a stable reproducible  supine position for radiation therapy.  CT images were obtained.  Surface markings were placed.  The CT images were loaded into the planning software.  Then the target and avoidance structures were contoured.  Treatment planning then occurred.  The radiation prescription was entered and confirmed.   I have requested : Brachytherapy Isodose Plan and Dosimetry Calculations to plan the radiation distribution.    PLAN:  The patient will receive 18 Gy in 3 fractions.the patient will be treated with the 2.5 cm segmented cylinder with a treatment length of 3 cm and a prescription to the mucosal surface. Iridium 192 will be the high-dose-rate source    ________________________________  Blair Promise, PhD, MD

## 2018-01-03 NOTE — Progress Notes (Signed)
  Radiation Oncology         [phone] ________________________________  Name: DNASIA GAUNA MRN: [id number]  Date: 01/03/2018  DOB: 25-Feb-1933  CC: Leeroy Cha, MD  Everitt Amber, MD  HDR BRACHYTHERAPY NOTE  DIAGNOSIS: Stage IB, grade I endometrioid adenocarcinoma,(with isolated tumor cells in the SLN's)     Simple treatment device note: Patient had construction of her custom vaginal cylinder. She will be treated with a 2.5 cm diameter segmented cylinder. This conforms to her anatomy without undue discomfort.  Vaginal brachytherapy procedure node: The patient was brought to the [place] suite. Identity was confirmed. All relevant records and images related to the planned course of therapy were reviewed. The patient freely provided informed written consent to proceed with treatment after reviewing the details related to the planned course of therapy. The consent form was witnessed and verified by the simulation staff. Then, the patient was set-up in a stable reproducible supine position for radiation therapy. Pelvic exam revealed the vaginal cuff to be intact . The patient's custom vaginal cylinder was placed in the proximal vagina. This was affixed to the CT/MR stabilization plate to prevent slippage. Patient tolerated the placement well.  Verification simulation note:  A fiducial marker was placed within the vaginal cylinder. An AP and lateral film was then obtained through the pelvis area. This documented accurate position of the vaginal cylinder for treatment.  HDR BRACHYTHERAPY TREATMENT  The remote afterloading device was affixed to the vaginal cylinder by catheter. Patient then proceeded to undergo her first high-dose-rate treatment directed at the proximal vagina. The patient was prescribed a dose of 6 gray to be delivered to the mucosal surface. Treatment length was 3 cm. Patient was treated with 1 channel using 6 dwell positions. Treatment time was 147.9 seconds. Iridium  192 was the high-dose-rate source for treatment. The patient tolerated the treatment well. After completion of her therapy, a radiation survey was performed documenting return of the iridium source into the Gamma[employer] safe.   PLAN: patient will return next week for her second high-dose-rate treatment ________________________________  Blair Promise, PhD, MD

## 2018-01-05 ENCOUNTER — Telehealth: Payer: Self-pay

## 2018-01-05 ENCOUNTER — Encounter: Payer: Self-pay | Admitting: Radiation Oncology

## 2018-01-05 ENCOUNTER — Other Ambulatory Visit: Payer: Self-pay | Admitting: Radiation Oncology

## 2018-01-05 LAB — URINE CULTURE: Culture: 80000 — AB

## 2018-01-05 MED ORDER — NITROFURANTOIN MONOHYD MACRO 100 MG PO CAPS: 100.0000 mg | ORAL_CAPSULE | Freq: Two times a day (BID) | ORAL | 0 refills | Status: DC

## 2018-01-05 NOTE — Progress Notes (Signed)
  Radiation Oncology         [phone] ________________________________  Name: Charlotte Castro MRN: [id number]  Date: 01/05/2018  DOB: 05-Dec-1932  End of Treatment Note  Diagnosis:   Stage IB, grade I endometrioid adenocarcinoma, (with isolated tumor cells in the SLN's)      Indication for treatment:  Curative       Radiation treatment dates:   11/14/2017 - 12/20/2017  Site/dose:   Pelvis_v cuff/ 18 Gy delivered in 3 fractions of 6 Gy (previous pelvis xrt to 45 Gy)  Beams/energy:   HDR Ir-192 Vaginal/ 6X  Narrative: The patient tolerated radiation treatment relatively well.   At the beginning she experienced vomiting. By the end of treatment she endorsed nocturia and nausea. Throughout the patient endorsed fatigue and diarrhea, but denied skin irritation, pain, vaginal bleeding, vaginal discharge, dysuria, and rectal bleeding.   Plan: The patient has completed radiation treatment. The patient will return to radiation oncology clinic for routine followup in one month. I advised them to call or return sooner if they have any questions or concerns related to their recovery or treatment.  ______________________  Blair Promise, PhD, MD  This document serves as a record of services personally performed by Blair Promise, PhD, MD. It was created on his behalf by Margit Banda, a trained medical scribe. The creation of this record is based on the scribe's personal observations and the provider's statements to them. This document has been checked and approved by the attending provider.

## 2018-01-05 NOTE — Telephone Encounter (Signed)
Left VM on generic VM requesting pt to return call to this RN. Per Dr. Sondra Come, UA showed UTI and Nitrofurantoin had been sent to pharmacy on file. Will convey this message once pt contacts this RN at direct number, which was left on VM. Loma Sousa, RN BSN

## 2018-01-09 ENCOUNTER — Telehealth: Payer: Self-pay | Admitting: *Deleted

## 2018-01-09 NOTE — Telephone Encounter (Signed)
Called patient to remind of HDR Tx. for 01-10-18 @ 2 pm, spoke with patient and she is aware of this tx.

## 2018-01-10 ENCOUNTER — Ambulatory Visit
Admission: RE | Admit: 2018-01-10 | Discharge: 2018-01-10 | Disposition: A | Discharge status: Home / Self Care | Payer: Medicare Other | Source: Ambulatory Visit | Attending: Radiation Oncology | Admitting: Radiation Oncology

## 2018-01-10 DIAGNOSIS — C541 Malignant neoplasm of endometrium: Secondary | ICD-10-CM

## 2018-01-10 NOTE — Progress Notes (Signed)
  Radiation Oncology         [phone] ________________________________  Name: Charlotte Castro MRN: [id number]  Date: 01/10/2018  DOB: 03/12/1933  CC: Leeroy Cha, MD  Everitt Amber, MD  HDR BRACHYTHERAPY NOTE  DIAGNOSIS: Stage IB, grade I endometrioid adenocarcinoma,(with isolated tumor cells in the SLN's)    Simple treatment device note: Patient had construction of her custom vaginal cylinder. She will be treated with a 2.5 cm diameter segmented cylinder. This conforms to her anatomy without undue discomfort.  Vaginal brachytherapy procedure node: The patient was brought to the [facility] suite. Identity was confirmed. All relevant records and images related to the planned course of therapy were reviewed. The patient freely provided informed written consent to proceed with treatment after reviewing the details related to the planned course of therapy. The consent form was witnessed and verified by the simulation staff. Then, the patient was set-up in a stable reproducible supine position for radiation therapy. Pelvic exam revealed the vaginal cuff to be intact . The patient's custom vaginal cylinder was placed in the proximal vagina. This was affixed to the CT/MR stabilization plate to prevent slippage. Patient tolerated the placement well.  Verification simulation note:  A fiducial marker was placed within the vaginal cylinder. An AP and lateral film was then obtained through the pelvis area. This documented accurate position of the vaginal cylinder for treatment.  HDR BRACHYTHERAPY TREATMENT  The remote afterloading device was affixed to the vaginal cylinder by catheter. Patient then proceeded to undergo her second high-dose-rate treatment directed at the proximal vagina. The patient was prescribed a dose of 6.0 gray to be delivered to the mucosal surface. Treatment length was 3.0 cm. Patient was treated with 1 channel using 6 dwell positions. Treatment time was 158.0 seconds.  Iridium 192 was the high-dose-rate source for treatment. The patient tolerated the treatment well. After completion of her therapy, a radiation survey was performed documenting return of the iridium source into the GammaMed safe.   PLAN: the patient will return next week for her third and final high-dose rate treatment. ________________________________  Blair Promise, PhD, MD

## 2018-01-12 ENCOUNTER — Ambulatory Visit: Payer: Self-pay | Admitting: Radiation Oncology

## 2018-01-12 ENCOUNTER — Ambulatory Visit: Payer: Medicare Other | Admitting: Radiation Oncology

## 2018-01-13 ENCOUNTER — Ambulatory Visit: Payer: Medicare Other | Admitting: Radiation Oncology

## 2018-01-16 ENCOUNTER — Telehealth: Payer: Self-pay | Admitting: *Deleted

## 2018-01-16 ENCOUNTER — Ambulatory Visit: Payer: Medicare Other | Admitting: Radiation Oncology

## 2018-01-16 NOTE — Telephone Encounter (Signed)
CALLED PATIENT TO REMIND OF HDR TX. FOR 01-17-18 @ 2 PM, SPOKE WITH PATIENT AND SHE IS AWARE OF THIS Charlotte Castro.

## 2018-01-17 ENCOUNTER — Ambulatory Visit
Admission: RE | Admit: 2018-01-17 | Discharge: 2018-01-17 | Disposition: A | Discharge status: Home / Self Care | Payer: Medicare Other | Source: Ambulatory Visit | Attending: Radiation Oncology | Admitting: Radiation Oncology

## 2018-01-17 ENCOUNTER — Other Ambulatory Visit: Payer: Self-pay

## 2018-01-17 DIAGNOSIS — C541 Malignant neoplasm of endometrium: Secondary | ICD-10-CM | POA: Diagnosis not present

## 2018-01-17 DIAGNOSIS — R300 Dysuria: Secondary | ICD-10-CM | POA: Insufficient documentation

## 2018-01-17 MED ORDER — PHENAZOPYRIDINE HCL 100 MG PO TABS: 100.0000 mg | ORAL_TABLET | Freq: Three times a day (TID) | ORAL | 0 refills | Status: DC | PRN

## 2018-01-17 NOTE — Progress Notes (Signed)
  Radiation Oncology         [phone] ________________________________  Name: Charlotte Castro MRN: [id number]  Date: 01/17/2018  DOB: [birth year]  CC: Leeroy Cha, MD  Everitt Amber, MD  HDR BRACHYTHERAPY NOTE  DIAGNOSIS: Stage IB, grade I endometrioid adenocarcinoma,(with isolated tumor cells in the SLN's)     Simple treatment device note: Patient had construction of her custom vaginal cylinder. She will be treated with a 2.5 cm diameter segmented cylinder. This conforms to her anatomy without undue discomfort.  Vaginal brachytherapy procedure node: The patient was brought to the [facility] suite. Identity was confirmed. All relevant records and images related to the planned course of therapy were reviewed. The patient freely provided informed written consent to proceed with treatment after reviewing the details related to the planned course of therapy. The consent form was witnessed and verified by the simulation staff. Then, the patient was set-up in a stable reproducible supine position for radiation therapy. Pelvic exam revealed the vaginal cuff to be intact . The patient's custom vaginal cylinder was placed in the proximal vagina. This was affixed to the CT/MR stabilization plate to prevent slippage. Patient tolerated the placement well.  Verification simulation note:  A fiducial marker was placed within the vaginal cylinder. An AP and lateral film was then obtained through the pelvis area. This documented accurate position of the vaginal cylinder for treatment.  HDR BRACHYTHERAPY TREATMENT  The remote afterloading device was affixed to the vaginal cylinder by catheter. Patient then proceeded to undergo her third high-dose-rate treatment directed at the proximal vagina. The patient was prescribed a dose of 6.0 gray to be delivered to the mucosal surface. Treatment length was 3.0 cm. Patient was treated with 1 channel using 6 dwell positions. Treatment time was 168.8 seconds. Iridium  192 was the high-dose-rate source for treatment. The patient tolerated the treatment well. After completion of her therapy, a radiation survey was performed documenting return of the iridium source into the Gamma[employer] safe.   PLAN: the patient has completed her course of radiation therapy. She will be scheduled for routine follow-up in one month. Patient has had some urinary symptoms and will present to the lab for additional urinalysis and culture. She was placed on nitrofurantoin for previous bladder infection. ________________________________  Blair Promise, PhD, MD

## 2018-01-18 LAB — URINE CULTURE

## 2018-01-18 NOTE — Progress Notes (Signed)
  Radiation Oncology         [phone] ________________________________  Name: Charlotte Castro MRN: [id number]  Date: 12/20/2017  DOB: 1933/05/07  End of Treatment Note  Diagnosis:   Stage IB, grade I endometrioid adenocarcinoma,(with isolated tumor cells in the SLN's)  Indication for treatment:  Curative       Radiation treatment dates:   11/14/2017 to 12/20/2017  Site/dose:   The pelvis was treated to 45 Gy in 25 fractions at 1.8 Gy.  Beams/energy:   IMRT // 6X  Narrative: The patient tolerated radiation treatment relatively well.   Patient denies any pain. Reports moderate fatigue. Reports nocturia x4. Reports diarrhea, but takes Imodium for relief. Denies any vaginal or rectal bleeding. Denies any vaginal discharge. Reports some nausea. Denies any skin irritation.   Plan: The patient has completed radiation treatment. The patient will return to radiation oncology clinic for intracavitary brachytherapy treatments in the next several days. I advised them to call or return sooner if they have any questions or concerns related to their recovery or treatment.  -----------------------------------  Blair Promise, PhD, MD  This document serves as a record of services personally performed by Gery Pray, MD. It was created on his behalf by Arlyce Harman, a trained medical scribe. The creation of this record is based on the scribe's personal observations and the provider's statements to them. This document has been checked and approved by the attending provider.

## 2018-01-19 ENCOUNTER — Ambulatory Visit: Payer: Medicare Other | Admitting: Radiation Oncology

## 2018-01-19 ENCOUNTER — Ambulatory Visit: Payer: Self-pay | Admitting: Radiation Oncology

## 2018-01-20 ENCOUNTER — Other Ambulatory Visit: Payer: Self-pay | Admitting: Interventional Cardiology

## 2018-01-20 DIAGNOSIS — I4821 Permanent atrial fibrillation: Secondary | ICD-10-CM

## 2018-01-20 NOTE — Telephone Encounter (Signed)
Pt last seen in Coumadin Clinic on 10/25/17, has not had INR checked in 3 months since. Called spoke with pt, pt states she has been undergoing radiation treatment and has not felt good enough to be able to bring herself to appt.  Pt states she is weak and has been being shuttled by the [facility] to and from radiation.  Pt has a break in radiation tx at present, but is unable to bring herself to appt in Coumadin Clinic.  Pt's son sometimes helps her to and from appts, but she states he is getting ready to have a back and hernia operation.  Advised pt it is dangerous to be on Coumadin and not be monitored.  Pt states she has been out of Coumadin x 2 days.  Asked if her son could bring her to an appt to check INR she is going to check with him today and call back for appt.

## 2018-01-26 ENCOUNTER — Ambulatory Visit (INDEPENDENT_AMBULATORY_CARE_PROVIDER_SITE_OTHER): Payer: Medicare Other

## 2018-01-26 ENCOUNTER — Ambulatory Visit: Payer: Medicare Other | Admitting: Radiation Oncology

## 2018-01-26 DIAGNOSIS — Z5181 Encounter for therapeutic drug level monitoring: Secondary | ICD-10-CM

## 2018-01-26 DIAGNOSIS — I482 Chronic atrial fibrillation: Secondary | ICD-10-CM

## 2018-01-26 DIAGNOSIS — I4821 Permanent atrial fibrillation: Secondary | ICD-10-CM

## 2018-01-26 LAB — POCT INR: INR: 2.1 (ref 2.0–3.0)

## 2018-01-26 NOTE — Patient Instructions (Signed)
Description   Continue taking Coumadin 1 tablet (5mg ) every day except 1/2 tablet (2.5mg ) on Sundays. Recheck INR 6 weeks. Call if placed on any new medications [phone] or if you have any upcoming procedures.

## 2018-02-20 ENCOUNTER — Encounter: Payer: Self-pay | Admitting: Radiation Oncology

## 2018-02-20 ENCOUNTER — Other Ambulatory Visit: Payer: Self-pay

## 2018-02-20 ENCOUNTER — Ambulatory Visit
Admission: RE | Admit: 2018-02-20 | Discharge: 2018-02-20 | Disposition: A | Discharge status: Home / Self Care | Payer: Medicare Other | Source: Ambulatory Visit | Attending: Radiation Oncology | Admitting: Radiation Oncology

## 2018-02-20 VITALS — BP 109/67 | HR 76 | Temp 98.4°F | Resp 20 | Ht 68.0 in | Wt 189.4 lb

## 2018-02-20 DIAGNOSIS — Z880 Allergy status to penicillin: Secondary | ICD-10-CM | POA: Diagnosis not present

## 2018-02-20 DIAGNOSIS — Z7901 Long term (current) use of anticoagulants: Secondary | ICD-10-CM | POA: Insufficient documentation

## 2018-02-20 DIAGNOSIS — Z923 Personal history of irradiation: Secondary | ICD-10-CM | POA: Diagnosis not present

## 2018-02-20 DIAGNOSIS — Z79890 Hormone replacement therapy (postmenopausal): Secondary | ICD-10-CM | POA: Diagnosis not present

## 2018-02-20 DIAGNOSIS — C541 Malignant neoplasm of endometrium: Secondary | ICD-10-CM | POA: Insufficient documentation

## 2018-02-20 DIAGNOSIS — Z79899 Other long term (current) drug therapy: Secondary | ICD-10-CM | POA: Insufficient documentation

## 2018-02-20 NOTE — Progress Notes (Signed)
Pt here today for follow-up for endometrial cancer. Pt denies having any pelvic pain. Pt states mild diarrhea and take lomotil to help. Pt denies having nausea or vomiting. Pt denies having dysuria or hematuria. Pt states having gray discharge after treatment. Pt states that she has mild leakage. Pt denies having skin irritation.  Dilator s and xs given with instructions. Pt verbalized understanding.  BP 109/67 (BP Location: Left Arm, Patient Position: Sitting)   Pulse 76   Temp 98.4 F (36.9 C) (Oral)   Resp 20   Ht 5\' 8"  (1.727 m)   Wt 189 lb 6 oz (85.9 kg)   SpO2 100%   BMI 28.79 kg/m    Wt Readings from Last 3 Encounters:  02/20/18 189 lb 6 oz (85.9 kg)  01/03/18 189 lb 6.4 oz (85.9 kg)  10/26/17 198 lb 9.6 oz (90.1 kg)

## 2018-02-20 NOTE — Progress Notes (Signed)
Radiation Oncology         [phone] ________________________________  Name: Charlotte Castro MRN: [id number]  Date: 02/20/2018  DOB: [birth year]  Follow-Up Visit Note  CC: Leeroy Cha, MD  Everitt Amber, MD    ICD-10-CM   1. Endometrial cancer ([place]) C54.1     Diagnosis:   Stage IB, grade I endometrioid adenocarcinoma,(with isolated tumor cells in the SLN's)     Interval Since Last Radiation:  2 months   Radiation treatment dates:   11/14/2017 - 12/20/2017  HDR 7/23, 7/30, 8/6  Site/dose:   The pelvis was treated to 45 Gy in 25 fractions at 1.8 Gy. (IMRT)  Site/dose:    1. Pelvis_v cuff/ 18 Gy delivered in 3 fractions of 6 Gy   Narrative:  The patient returns today for routine follow-up.  She is doing well overall.   On review of systems, she reports fatigue, mild diarrhea (treating with lomotil), and gray vaginal discharge after treatment. she denies nausea, vomiting, dysuria, hematuria, skin irritation, and any other symptoms. Pertinent positives are listed and detailed within the above HPI.                 ALLERGIES:  is allergic to iodinated diagnostic agents and penicillins.  Meds: Current Outpatient Medications  Medication Sig Dispense Refill  . diphenoxylate-atropine (LOMOTIL) 2.5-0.025 MG tablet Take 1 tablet by mouth 4 (four) times daily as needed for diarrhea or loose stools. 30 tablet 0  . levothyroxine (SYNTHROID, LEVOTHROID) 88 MCG tablet Take 1 tablet (88 mcg total) by mouth daily before breakfast. 30 tablet 0  . metoprolol tartrate (LOPRESSOR) 50 MG tablet Take 1 tablet (50 mg total) by mouth 2 (two) times daily. 60 tablet 0  . nitrofurantoin, macrocrystal-monohydrate, (MACROBID) 100 MG capsule Take 1 capsule (100 mg total) by mouth 2 (two) times daily. 10 capsule 0  . phenazopyridine (PYRIDIUM) 100 MG tablet Take 1 tablet (100 mg total) by mouth 3 (three) times daily as needed for pain. 10 tablet 0  . warfarin (COUMADIN) 5 MG tablet TAKE 1/2 TO 1  TABLET DAILY OR TAKE AS DIRECTED BY COUMADIN CLINIC 30 tablet 1  . diphenhydrAMINE (BENADRYL) 50 MG tablet Take 1 tablet (50 mg total) by mouth once for 1 dose. One hour prior to CT scan 1 tablet 0   No current facility-administered medications for this encounter.     Physical Findings: The patient is in no acute distress. Patient is alert and oriented.  height is 5\' 8"  (1.727 m) and weight is 189 lb 6 oz (85.9 kg). Her oral temperature is 98.4 F (36.9 C). Her blood pressure is 109/67 and her pulse is 76. Her respiration is 20 and oxygen saturation is 100%.  Lungs are clear to auscultation bilaterally. Heart has regular rate and rhythm. No palpable cervical, supraclavicular, or axillary adenopathy. Abdomen soft, non-tender, normal bowel sounds. Pelvic exam not performed in light of recent completion of treatment.    Lab Findings: Lab Results  Component Value Date   WBC 4.8 11/22/2017   HGB 14.0 11/22/2017   HCT 40.5 11/22/2017   MCV 96.2 11/22/2017   PLT 174 11/22/2017    Radiographic Findings: No results found.  Impression:  Clinically stable, recovering from effects of radiation therapy. She continues to have diffuse mild pain, but this is improving. No pelvic pain or vaginal bleeding.   Plan:  Routine follow up in Radiation Oncology in 5 months. She will follow up with Dr. Denman George in 2 months. The  patient was given a vaginal dilator and instructions on its use  ____________________________________   Blair Promise, PhD, MD    This document serves as a record of services personally performed by Gery Pray, MD. It was created on his behalf by [facility], a trained medical scribe. The creation of this record is based on the scribe's personal observations and the provider's statements to them. This document has been checked and approved by the attending provider.

## 2018-02-22 ENCOUNTER — Other Ambulatory Visit: Payer: Self-pay | Admitting: Interventional Cardiology

## 2018-02-22 DIAGNOSIS — I4821 Permanent atrial fibrillation: Secondary | ICD-10-CM

## 2018-02-22 DIAGNOSIS — I10 Essential (primary) hypertension: Secondary | ICD-10-CM

## 2018-03-09 ENCOUNTER — Ambulatory Visit (INDEPENDENT_AMBULATORY_CARE_PROVIDER_SITE_OTHER): Payer: Medicare Other | Admitting: *Deleted

## 2018-03-09 DIAGNOSIS — I482 Chronic atrial fibrillation: Secondary | ICD-10-CM

## 2018-03-09 DIAGNOSIS — Z5181 Encounter for therapeutic drug level monitoring: Secondary | ICD-10-CM

## 2018-03-09 DIAGNOSIS — I4821 Permanent atrial fibrillation: Secondary | ICD-10-CM

## 2018-03-09 LAB — POCT INR: INR: 4.9 — AB (ref 2.0–3.0)

## 2018-03-09 NOTE — Patient Instructions (Signed)
Description   Skip today and tomorrow's dose, then Continue taking Coumadin 1 tablet (5mg ) every day except 1/2 tablet (2.5mg ) on Sundays. Recheck INR in 10 days.  Call if placed on any new medications [phone] or if you have any upcoming procedures.

## 2018-03-20 ENCOUNTER — Ambulatory Visit (INDEPENDENT_AMBULATORY_CARE_PROVIDER_SITE_OTHER): Payer: Medicare Other | Admitting: *Deleted

## 2018-03-20 DIAGNOSIS — Z5181 Encounter for therapeutic drug level monitoring: Secondary | ICD-10-CM | POA: Diagnosis not present

## 2018-03-20 DIAGNOSIS — I4821 Permanent atrial fibrillation: Secondary | ICD-10-CM | POA: Diagnosis not present

## 2018-03-20 LAB — POCT INR: INR: 2.5 (ref 2.0–3.0)

## 2018-03-20 NOTE — Patient Instructions (Signed)
Description   Continue taking Coumadin 1 tablet (5mg ) every day except 1/2 tablet (2.5mg ) on Sundays. Recheck INR in 3 weeks. Call if placed on any new medications [phone] or if you have any upcoming procedures.

## 2018-03-27 ENCOUNTER — Telehealth: Payer: Self-pay | Admitting: *Deleted

## 2018-03-27 ENCOUNTER — Other Ambulatory Visit: Payer: Self-pay | Admitting: Interventional Cardiology

## 2018-03-27 DIAGNOSIS — I4821 Permanent atrial fibrillation: Secondary | ICD-10-CM

## 2018-03-27 NOTE — Telephone Encounter (Signed)
xxxx 

## 2018-03-27 NOTE — Telephone Encounter (Signed)
CALLED PATIENT TO INFORM OF FU WITH DR. Denman George ON 04-17-18- ARRIVAL TIME- 10:30 AM AND HER APPT. WITH DR. KINARD ON 07/24/18 @ 11:15 AM, SPOKE WITH PATIENT AND SHE IS AWARE OF THESE APPTS.

## 2018-04-10 ENCOUNTER — Ambulatory Visit (INDEPENDENT_AMBULATORY_CARE_PROVIDER_SITE_OTHER): Payer: Medicare Other

## 2018-04-10 DIAGNOSIS — I4821 Permanent atrial fibrillation: Secondary | ICD-10-CM

## 2018-04-10 DIAGNOSIS — Z5181 Encounter for therapeutic drug level monitoring: Secondary | ICD-10-CM | POA: Diagnosis not present

## 2018-04-10 LAB — POCT INR: INR: 3.5 — AB (ref 2.0–3.0)

## 2018-04-10 NOTE — Patient Instructions (Signed)
Description   Skip today's dosage of Coumadin, then start taking 1 tablet (5mg ) every day except 1/2 tablet (2.5mg ) on Sundays and Thursdays (2.5mg ). Recheck INR in 2-3 weeks with Dr Irish Lack appt. Call if placed on any new medications [phone] or if you have any upcoming procedures.

## 2018-04-17 ENCOUNTER — Inpatient Hospital Stay: Payer: Medicare Other | Attending: Gynecologic Oncology | Admitting: Gynecologic Oncology

## 2018-04-17 ENCOUNTER — Encounter: Payer: Self-pay | Admitting: Gynecologic Oncology

## 2018-04-17 VITALS — BP 138/79 | HR 88 | Temp 98.1°F | Resp 20 | Ht 68.0 in | Wt 196.0 lb

## 2018-04-17 DIAGNOSIS — C541 Malignant neoplasm of endometrium: Secondary | ICD-10-CM | POA: Diagnosis present

## 2018-04-17 DIAGNOSIS — Z90710 Acquired absence of both cervix and uterus: Secondary | ICD-10-CM | POA: Insufficient documentation

## 2018-04-17 DIAGNOSIS — Z90722 Acquired absence of ovaries, bilateral: Secondary | ICD-10-CM | POA: Insufficient documentation

## 2018-04-17 DIAGNOSIS — Z923 Personal history of irradiation: Secondary | ICD-10-CM | POA: Diagnosis present

## 2018-04-17 NOTE — Patient Instructions (Addendum)
Please notify Dr Denman George at phone number [phone] if you notice vaginal bleeding, new pelvic or abdominal pains, bloating, feeling full easy, or a change in bladder or bowel function.   Please return to see Dr Denman George in May, 2020.  Dr Denman George recommends seeing Dr Rodena Goldmann at [place], [place].

## 2018-04-17 NOTE — Progress Notes (Signed)
Follow-up Note: Gyn-Onc  Consult was requested by Dr. Harolyn Rutherford for the evaluation of Charlotte Castro 84 y.o. female  CC:  Chief Complaint  Patient presents with  . Endometrial cancer [facility])    Assessment/Plan:  Charlotte Castro  is a 84 y.o.  year old with stage IB (with isolated tumor cells in SLN's), grade I endometrioid endometrioid adenocarcinoma. MSI normal/stable. S/p whole pelvic RT with vaginal brachytherapy completed in August, 2019 for high risk features.    Discussed symptoms of recurrence and notified to return to see [place] for these.  She will see either Dr Sondra Come or myself every 3 months for 2 years followed by 6 monthly exams thereafter.  Will see me again in May, 2020.  HPI: Charlotte Castro is an 84 year old parous woman who is seen in consultation at the request of Dr Harolyn Rutherford for grade 1 endometrial cancer.  The patient reports that 2-84-monthhistory of vaginal bleeding which is light.  She was taken to the operating room on 520-second 2019 [facility] a FIGO grade 1 endometrial adenocarcinoma.  Prior to surgery she had undergone an ultrasound scan which revealed a uterus measuring 5.6 x 3.2 x 3.3 cm, with a thickened endometrium of 12 mm.  The right and left ovary were not visualized.  There is no abnormal free fluid.  She was prescribed megace therapy.   The patient's past medical history is significant for the below stated conditions.  In particular she has atrial fibrillation and atrial flutter for which she is on multiple cardiac medications and anticoagulation with Coumadin therapeutic to 2.5.  She also has had a recent history of [facility]  She had laparoscopic cholecystectomy and a total 7-day hospital stay followed by 21-day rehab stay for general deconditioning.  The patient was discharged to home where she lives alone.  Other past medical history of significance is  gastritis.  She has gastroesophageal reflux disease and chronic nausea which is unexplained.  She has been seen by gastroenterologist for this recently however has not had an EGD.  No imaging has revealed evidence of an etiology for her persistent nausea.  She has a history of lumbar spine surgery shoulder surgery and fracture surgery.  She has a history of a cardiac catheterization in 2012.  She also had atrial ablation surgery in 2012.  With respect to cardiac workup [facility]  Her most recent cardiac echo was in December 2018 which was a stress echo and revealed an ejection fraction of 68%.   On September 20, 2017 she underwent a robotic assisted total hysterectomy BSO and pelvic sentinel lymph node biopsy.  Surgery was uncomplicated.  Intraoperative findings were unremarkable.  Final pathology revealed a 1.7 cm grade 1 endometrioid adenocarcinoma.  There was 1.2 cm of 1.8 cm myometrial invasion representing more than one half.  No lymphovascular space invasion was identified.  The cervical stroma was negative for involvement as were the adnexa.  However in both sentinel lymph nodes there were small clusters of metastatic carcinoma each less than 0.2 mm, representing isolated tumor cells.  These were identified on immunostains for pankeratin.  She was staged as stage Ib pN0(1+), and in accordance to NCCN guidelines, due to high risk features and her pathology, adjuvant therapy with whole pelvic radiation and vaginal brachii therapy was recommended.  Given the low volume metastatic disease of  unclear clinical significance in the sentinel nodes, chemotherapy was not recommended.  Postsurgical PET/CT was performed on October 10, 2017.  This revealed no gross metastatic disease, however there were indeterminate multiple pulmonary nodules the largest in size was millimeters in the right middle right upper lobe.  These were below PET resolution.  Recommendation was made for a  dedicated chest CT to serve as a baseline and for follow-up CT in 3 months.  Interval Hx:  CT chest in May, 2019 revealed no change in the pulmonary nodules which were felt to be benign.  She completed radiation therapy in August, 2019. She received 45 Gy in 25 fractions of 1.8Gy of IMRT external beam radiation to the pelvis with HDR vaginal brachytherapy delivered in 3 fractions of 6Gy between 01/03/18, 01/10/18 and 01/17/18.  She had issues of fatigue during therapy, but otherwise tolerated well with no toxicities.   Current Meds:  Outpatient Encounter Medications as of 04/17/2018  Medication Sig  . levothyroxine (SYNTHROID, LEVOTHROID) 88 MCG tablet Take 1 tablet (88 mcg total) by mouth daily before breakfast.  . metoprolol tartrate (LOPRESSOR) 50 MG tablet Take 1 tablet (50 mg total) by mouth 2 (two) times daily with a meal.  . warfarin (COUMADIN) 5 MG tablet TAKE 1/2 TO 1 TABLET DAILY OR TAKE AS DIRECTED BY COUMADIN CLINIC  . diphenhydrAMINE (BENADRYL) 50 MG tablet Take 1 tablet (50 mg total) by mouth once for 1 dose. One hour prior to CT scan  . [DISCONTINUED] diphenoxylate-atropine (LOMOTIL) 2.5-0.025 MG tablet Take 1 tablet by mouth 4 (four) times daily as needed for diarrhea or loose stools.  . [DISCONTINUED] nitrofurantoin, macrocrystal-monohydrate, (MACROBID) 100 MG capsule Take 1 capsule (100 mg total) by mouth 2 (two) times daily.  . [DISCONTINUED] phenazopyridine (PYRIDIUM) 100 MG tablet Take 1 tablet (100 mg total) by mouth 3 (three) times daily as needed for pain.   No facility-administered encounter medications on file as of 04/17/2018.     Allergy:  Allergies  Allergen Reactions  . Iodinated Diagnostic Agents Shortness Of Breath    headache  . Penicillins Other (See Comments)    Tolerated Zosyn Oct 2018    Social Hx:   Social History   Socioeconomic History  . Marital status: Widowed    Spouse name: Not on file  . Number of children: 2  . Years of education: 31  .  Highest education level: Not on file  Occupational History  . Not on file  Social Needs  . Financial resource strain: Not on file  . Food insecurity:    Worry: Not on file    Inability: Not on file  . Transportation needs:    Medical: Not on file    Non-medical: Not on file  Tobacco Use  . Smoking status: Never Smoker  . Smokeless tobacco: Never Used  Substance and Sexual Activity  . Alcohol use: No  . Drug use: No  . Sexual activity: Never    Birth control/protection: Post-menopausal  Lifestyle  . Physical activity:    Days per week: Not on file    Minutes per session: Not on file  . Stress: Not on file  Relationships  . Social connections:    Talks on phone: Not on file    Gets together: Not on file    Attends religious service: Not on file    Active member of club or organization: Not on file    Attends meetings of clubs or organizations: Not on file  Relationship status: Not on file  . Intimate partner violence:    Fear of current or ex partner: Not on file    Emotionally abused: Not on file    Physically abused: Not on file    Forced sexual activity: Not on file  Other Topics Concern  . Not on file  Social History Narrative   Lives alone in a one story home.  Has 2 children.  Retired.  Education: high school.      Past Surgical Hx:  Past Surgical History:  Procedure Laterality Date  . ATRIAL ABLATION SURGERY  11/17/10   Atrial tachycardia arising from [facility] of the aorta ablated by JA  . BACK SURGERY    . CARDIAC CATHETERIZATION  01/11/2011   Archie Endo 01/12/2011  . CHOLECYSTECTOMY N/A 04/08/2017   Procedure: LAPAROSCOPIC CHOLECYSTECTOMY;  Surgeon: Georganna Skeans, MD;  Location: [place];  Service: General;  Laterality: N/A;  . FRACTURE SURGERY    . HYSTEROSCOPY W/D&C N/A 08/05/2017   Procedure: DILATATION AND CURETTAGE /HYSTEROSCOPY AND POLYPECTOMY;  Surgeon: Osborne Oman, MD;  Location: [facility];  Service: Gynecology;  Laterality: N/A;  . LUMBAR SPINE SURGERY   ~ 1998  . ROBOTIC ASSISTED TOTAL HYSTERECTOMY WITH BILATERAL SALPINGO OOPHERECTOMY Bilateral 09/20/2017   Procedure: XI ROBOTIC ASSISTED TOTAL HYSTERECTOMY WITH BILATERAL SALPINGO OOPHORECTOMY, SENTINAL LYMPH NODE BIOPSY;  Surgeon: Everitt Amber, MD;  Location: [facility];  Service: Gynecology;  Laterality: Bilateral;  . SHOULDER SURGERY Right 1984 X2   Rollene Rotunda 01/19/2011  . WRIST FRACTURE SURGERY Left 1983    Past Medical Hx:  Past Medical History:  Diagnosis Date  . Anemia    history of  . Anxiety   . Arthritis    "left knee" (04/05/2017)  . Atrial fibrillation ([place])   . Atrial flutter ([place])    typical appearing  . Atrial tachycardia ([place])    ablated 11/17/10  by JA  from the [facility] of the aorta  . Chest pain on exertion   . CHF (congestive heart failure) ([place])   . Chronic lower back pain   . Chronic nausea   . Dizziness    chronic and of an unclear etiology  . Dyspnea   . Endometrial cancer (HCC)    grade 1  . Gallstone pancreatitis   . Gastritis   . GERD (gastroesophageal reflux disease)   . History of blood transfusion ~ 1948  . History of hiatal hernia   . HTN (hypertension)   . Hyperlipemia   . Hypothyroidism   . Left leg numbness   . Obesity   . Osteoporosis   . PMB (postmenopausal bleeding)   . Sinus headache   . Toe ulcer ([place])    left 3rd toe  . Vitamin D deficiency     Past Gynecological History:  SVD x 2 No LMP recorded. Patient is postmenopausal.  Family Hx:  Family History  Problem Relation Age of Onset  . Heart attack Father   . Hypertension Father     Review of Systems:  Constitutional  Feels fatigued   ENT Normal appearing ears and nares bilaterally Skin/Breast  No rash, sores, jaundice, itching, dryness Cardiovascular  + palpitations - has discussed with cardiologist. Pulmonary  No cough or wheeze.  Gastro Intestinal  No vomitting, or diarrhoea. No bright red blood per rectum, change in bowel movement, or constipation. No nausea Genito Urinary   No frequency, urgency, dysuria, no vaginal bleeding Musculo Skeletal  No myalgia, arthralgia, joint swelling or pain  Neurologic  No weakness, numbness, change in gait,  Psychology  No depression, anxiety, insomnia.   Vitals:  Blood pressure 138/79, pulse 88, temperature 98.1 F (36.7 C), temperature source Oral, resp. rate 20, height _0  (1.727 m), weight 196 lb (88.9 kg), SpO2 99 %.  Physical Exam: WD in NAD Neck  Supple NROM, without any enlargements.  Lymph Node Survey No cervical supraclavicular or inguinal adenopathy Cardiovascular  Pulse normal rate, regularity and rhythm. S1 and S2 normal.  Lungs  Clear to auscultation bilateraly, without wheezes/crackles/rhonchi. Good air movement.  Skin  No rash/lesions/breakdown  Psychiatry  Alert and oriented to person, place, and time  Abdomen  Normoactive bowel sounds, abdomen soft, non-tender and nonobese without evidence of hernia. Well healed incisions.  Back No CVA tenderness Genito Urinary  Normal external genitalia. Vaginal cuff well healed with no bleeding, in tact cuff, no lesions.  Rectal  deferred Extremities  No bilateral cyanosis, clubbing or edema.  Thereasa Solo, MD  04/17/2018, 11:15 AM

## 2018-05-01 NOTE — Progress Notes (Signed)
Cardiology Office Note   Date:  05/02/2018   ID:  Charlotte Castro, DOB 1933/05/20, MRN [id number]  PCP:  Leeroy Cha, MD    No chief complaint on file.  Arrhythmia/AFib  Wt Readings from Last 3 Encounters:  05/02/18 196 lb 12.8 oz (89.3 kg)  04/17/18 196 lb (88.9 kg)  02/20/18 189 lb 6 oz (85.9 kg)       History of Present Illness: Charlotte Castro is a 84 y.o. female  who has had several different types of tachyarrhythmia and bradycardia which have been difficult to manage over the past few years, since 2012-13; due to other medical issues. She has had syncope and was hospitalized in 2014. We have tried to evaluate her rhythm at home to see if she has significant pauses or heart rates below 40. She does not want to wear any type of heart monitor.   Her daughter came in the past, several years ago, annoyed with her mothers current health condition. Daughter stated she is self-employed so she has to take time off from work every time the mother is sick. She "wanted to get to the bottom of this." She was sent back to Dr. Rayann Heman. THere was no indication for pacer. She had a grey coating on her eyelid-thought to be related to Amiodarone. She is nowoff of amiodarone.  The patient was seeing neurology, but she stopped. She has a tremor and had been started on Parkinson's medicines. These aparently have been stopped. The patient disagreed with the diagnosis of Parkinsons. She was seen in the emergency room for syncope in 2014. Her main complaint is feeling weak and having joint pains. She does admit to significant anxiety which happens at random times. The daughter felt that anxiety is a big component of the patient's illness, when she wasthere at the last visit in 2/14.  THe patient was seen by ortho and rheumatology in the past. She took prednisone but this was tapered off. She still reports no energy and fatigue. She has had some difficulty in regulating her INR. She is  willing to try Eliquis but not Xarelto. She heard bad things about Xarelto.  She had problems getting her Eliquis. It apparently was not covered and would be very expensive. SHe switched to warfarin.  She had some mild hematuria in the past before warfarin which has resolved.   In the past, she had a large hiatal hernia and was referred to surgery, but she declined an operation. She is trying to stick to a soft diet.   She had GYN surgery, records reveal: "On 09/20/2017, the patient underwent the following: Procedure(s): XI ROBOTIC ASSISTED TOTAL HYSTERECTOMY WITH BILATERAL SALPINGO OOPHORECTOMY, SENTINAL LYMPH NODE BIOPSY. The postoperative course was uneventful. She was discharged to home on postoperative day 1tolerating a regular diet, ambulating, pain controlled.She is to resume her Coumadin 5 days after surgery. She was treated with radiation as well.    She had some gall stones removed.   Denies : Chest pain. Leg edema. Nitroglycerin use. Orthopnea. Paroxysmal nocturnal dyspnea. Shortness of breath. Syncope.   She feels weak since the surgery. No syncope.  No sustained palpitations and no cardiac events noted during the perioperative period.   Restarted Coumadin without any issues.  Past Medical History:  Diagnosis Date  . Anemia    history of  . Anxiety   . Arthritis    "left knee" (04/05/2017)  . Atrial fibrillation ([place])   . Atrial flutter ([place])  typical appearing  . Atrial tachycardia ([place])    ablated 11/17/10  by JA  from the [facility] of the aorta  . Chest pain on exertion   . CHF (congestive heart failure) ([place])   . Chronic lower back pain   . Chronic nausea   . Dizziness    chronic and of an unclear etiology  . Dyspnea   . Endometrial cancer (HCC)    grade 1  . Gallstone pancreatitis   . Gastritis   . GERD (gastroesophageal reflux disease)   . History of blood transfusion ~ 1948  . History of hiatal hernia   . HTN (hypertension)   . Hyperlipemia   .  Hypothyroidism   . Left leg numbness   . Obesity   . Osteoporosis   . PMB (postmenopausal bleeding)   . Sinus headache   . Toe ulcer ([place])    left 3rd toe  . Vitamin D deficiency     Past Surgical History:  Procedure Laterality Date  . ATRIAL ABLATION SURGERY  11/17/10   Atrial tachycardia arising from [facility] of the aorta ablated by JA  . BACK SURGERY    . CARDIAC CATHETERIZATION  01/11/2011   Archie Endo 01/12/2011  . CHOLECYSTECTOMY N/A 04/08/2017   Procedure: LAPAROSCOPIC CHOLECYSTECTOMY;  Surgeon: Georganna Skeans, MD;  Location: [place];  Service: General;  Laterality: N/A;  . FRACTURE SURGERY    . HYSTEROSCOPY W/D&C N/A 08/05/2017   Procedure: DILATATION AND CURETTAGE /HYSTEROSCOPY AND POLYPECTOMY;  Surgeon: Osborne Oman, MD;  Location: [facility];  Service: Gynecology;  Laterality: N/A;  . LUMBAR SPINE SURGERY  ~ 1998  . ROBOTIC ASSISTED TOTAL HYSTERECTOMY WITH BILATERAL SALPINGO OOPHERECTOMY Bilateral 09/20/2017   Procedure: XI ROBOTIC ASSISTED TOTAL HYSTERECTOMY WITH BILATERAL SALPINGO OOPHORECTOMY, SENTINAL LYMPH NODE BIOPSY;  Surgeon: Everitt Amber, MD;  Location: [facility];  Service: Gynecology;  Laterality: Bilateral;  . SHOULDER SURGERY Right 1984 X2   Rollene Rotunda 01/19/2011  . WRIST FRACTURE SURGERY Left 1983     Current Outpatient Medications  Medication Sig Dispense Refill  . levothyroxine (SYNTHROID, LEVOTHROID) 88 MCG tablet Take 1 tablet (88 mcg total) by mouth daily before breakfast. 30 tablet 0  . metoprolol tartrate (LOPRESSOR) 50 MG tablet Take 1 tablet (50 mg total) by mouth 2 (two) times daily with a meal. 60 tablet 6  . warfarin (COUMADIN) 5 MG tablet TAKE 1/2 TO 1 TABLET DAILY OR TAKE AS DIRECTED BY COUMADIN CLINIC 30 tablet 1   No current facility-administered medications for this visit.     Allergies:   Iodinated diagnostic agents and Penicillins    Social History:  The patient  reports that she has never smoked. She has never used smokeless tobacco. She reports that she  does not drink alcohol or use drugs.   Family History:  The patient's family history includes Heart attack in her father; Hypertension in her father.    ROS:  Please see the history of present illness.   Otherwise, review of systems are positive for bleeding problems prior to surgery; no problems since the surgery.   All other systems are reviewed and negative.    PHYSICAL EXAM: VS:  BP 140/82   Pulse 82   Ht 5\' 8"  (1.727 m)   Wt 196 lb 12.8 oz (89.3 kg)   SpO2 98%   BMI 29.92 kg/m  , BMI Body mass index is 29.92 kg/m. GEN: Well nourished, well developed, in no acute distress  HEENT: normal  Neck: no JVD, carotid bruits,  or masses Cardiac: RRR; no murmurs, rubs, or gallops,no edema  Respiratory:  clear to auscultation bilaterally, normal work of breathing GI: soft, nontender, nondistended, + BS MS: no deformity or atrophy  Skin: warm and dry, no rash Neuro:  Strength and sensation are intact, slow gait Psych: euthymic mood, full affect   Recent Labs: 06/10/2017: B Natriuretic Peptide 182.0 09/28/2017: NT-Pro BNP 840 11/08/2017: ALT 15 11/22/2017: BUN 19; Creatinine 0.81; Hemoglobin 14.0; Platelet Count 174; Potassium 3.9; Sodium 142   Lipid Panel No results found for: CHOL, TRIG, HDL, CHOLHDL, VLDL, LDLCALC, LDLDIRECT   Other studies Reviewed: Additional studies/ records that were reviewed today with results demonstrating: Hospital records reviewed.   ASSESSMENT AND PLAN:  1. AFib: Rate controlled.  Coumadin for stroke prevention.  No heart issues during major surgery recently.  Form for driver license filled out.  No syncope.  2. Chronic fatigue/SHOB: Weak after surgery.  Building up her strength. 3. HTN: Controlled.  COntinue current meds. 4. Anticoagulated: No bleeding problems.  Coumadin clinic following.  Getting checked today.  Using a cane to avoid falls.   Current medicines are reviewed at length with the patient today.  The patient concerns regarding her  medicines were addressed.  The following changes have been made:  No change  Labs/ tests ordered today include:  No orders of the defined types were placed in this encounter.   Recommend 150 minutes/week of aerobic exercise Low fat, low carb, high fiber diet recommended  Disposition:   FU in 6 months   Signed, Larae Grooms, MD  05/02/2018 8:31 AM    [facility] [place], [place], [place]  [address] Phone: [phone]; Fax: [phone]

## 2018-05-02 ENCOUNTER — Encounter: Payer: Self-pay | Admitting: Interventional Cardiology

## 2018-05-02 ENCOUNTER — Ambulatory Visit (INDEPENDENT_AMBULATORY_CARE_PROVIDER_SITE_OTHER): Payer: Medicare Other | Admitting: Interventional Cardiology

## 2018-05-02 ENCOUNTER — Ambulatory Visit (INDEPENDENT_AMBULATORY_CARE_PROVIDER_SITE_OTHER): Payer: Medicare Other | Admitting: Pharmacist

## 2018-05-02 VITALS — BP 140/82 | HR 82 | Ht 68.0 in | Wt 196.8 lb

## 2018-05-02 DIAGNOSIS — R5383 Other fatigue: Secondary | ICD-10-CM

## 2018-05-02 DIAGNOSIS — Z5181 Encounter for therapeutic drug level monitoring: Secondary | ICD-10-CM | POA: Diagnosis not present

## 2018-05-02 DIAGNOSIS — I10 Essential (primary) hypertension: Secondary | ICD-10-CM | POA: Diagnosis not present

## 2018-05-02 DIAGNOSIS — R0602 Shortness of breath: Secondary | ICD-10-CM | POA: Diagnosis not present

## 2018-05-02 DIAGNOSIS — I4821 Permanent atrial fibrillation: Secondary | ICD-10-CM

## 2018-05-02 DIAGNOSIS — Z7901 Long term (current) use of anticoagulants: Secondary | ICD-10-CM

## 2018-05-02 LAB — POCT INR: INR: 2.7 (ref 2.0–3.0)

## 2018-05-02 NOTE — Patient Instructions (Signed)

## 2018-05-02 NOTE — Patient Instructions (Signed)
Description   Continue 1 tablet (5mg ) every day except 1/2 tablet (2.5mg ) on Sundays and Thursdays (2.5mg ). Recheck INR in 4 weeks. Call if placed on any new medications [phone] or if you have any upcoming procedures.

## 2018-05-04 ENCOUNTER — Telehealth: Payer: Self-pay

## 2018-05-04 NOTE — Telephone Encounter (Signed)
Patient called wanting a refill on her Levothyroxine 88MCG; I tried to advise patient that we do not normally fill thyroid medication. She states that when she saw Dr. Irish Lack on the 19th he told her he would fill it. She does not have a primary care doctor right now. Will he fill this? Patient would like a call back.

## 2018-05-04 NOTE — Telephone Encounter (Signed)
Returned call to patient. Made patient aware that her synthroid should be prescribed from her PCP. We do not have recent thyroid function on file. Last on file is in [place] from 2017. Patient states that she is going to be establishing with a new PCP soon and will see if they will fill the medicine.

## 2018-05-25 ENCOUNTER — Other Ambulatory Visit: Payer: Self-pay | Admitting: Interventional Cardiology

## 2018-05-25 ENCOUNTER — Other Ambulatory Visit: Payer: Self-pay | Admitting: *Deleted

## 2018-05-25 DIAGNOSIS — I4821 Permanent atrial fibrillation: Secondary | ICD-10-CM

## 2018-05-25 MED ORDER — WARFARIN SODIUM 5 MG PO TABS: ORAL_TABLET | ORAL | 1 refills | Status: DC

## 2018-05-30 ENCOUNTER — Ambulatory Visit (INDEPENDENT_AMBULATORY_CARE_PROVIDER_SITE_OTHER): Payer: Medicare Other | Admitting: *Deleted

## 2018-05-30 DIAGNOSIS — Z5181 Encounter for therapeutic drug level monitoring: Secondary | ICD-10-CM

## 2018-05-30 DIAGNOSIS — I4821 Permanent atrial fibrillation: Secondary | ICD-10-CM | POA: Diagnosis not present

## 2018-05-30 LAB — POCT INR: INR: 2.3 (ref 2.0–3.0)

## 2018-05-30 NOTE — Patient Instructions (Addendum)
Description   Continue taking 1 tablet (5mg ) every day except 1/2 tablet (2.5mg ) on Sundays and Thursdays (2.5mg ). Recheck INR in 4 weeks. Call if placed on any new medications [phone] or if you have any upcoming procedures.

## 2018-06-27 ENCOUNTER — Ambulatory Visit (INDEPENDENT_AMBULATORY_CARE_PROVIDER_SITE_OTHER): Payer: Medicare Other

## 2018-06-27 DIAGNOSIS — I4821 Permanent atrial fibrillation: Secondary | ICD-10-CM

## 2018-06-27 DIAGNOSIS — Z5181 Encounter for therapeutic drug level monitoring: Secondary | ICD-10-CM | POA: Diagnosis not present

## 2018-06-27 LAB — POCT INR: INR: 1.9 — AB (ref 2.0–3.0)

## 2018-06-27 NOTE — Patient Instructions (Signed)
Please resume previous dosage 1 tablet (5mg ) every day except 1/2 tablet (2.5mg ) on Sundays.  (2.5mg ). Recheck INR in 4 weeks. Call if placed on any new medications [phone] or if you have any upcoming procedures

## 2018-06-28 ENCOUNTER — Other Ambulatory Visit: Payer: Self-pay | Admitting: Interventional Cardiology

## 2018-06-28 DIAGNOSIS — I4821 Permanent atrial fibrillation: Secondary | ICD-10-CM

## 2018-07-21 ENCOUNTER — Telehealth: Payer: Self-pay | Admitting: *Deleted

## 2018-07-21 NOTE — Telephone Encounter (Signed)
CALLED PATIENT'S DAUGHTER- TAMMY TO ASK ABOUT RESCHEDULING FU FOR 07-24-18 DUE TO DR. KINARD BEING IN THE OR, RESCHEDULED APPT. FOR 07-27-18 @ 3 PM, LVM FOR A RETURN CALL

## 2018-07-24 ENCOUNTER — Ambulatory Visit: Payer: Self-pay | Admitting: Radiation Oncology

## 2018-07-26 ENCOUNTER — Ambulatory Visit (INDEPENDENT_AMBULATORY_CARE_PROVIDER_SITE_OTHER): Payer: Medicare Other | Admitting: Pharmacist

## 2018-07-26 DIAGNOSIS — I4821 Permanent atrial fibrillation: Secondary | ICD-10-CM | POA: Diagnosis not present

## 2018-07-26 DIAGNOSIS — Z5181 Encounter for therapeutic drug level monitoring: Secondary | ICD-10-CM

## 2018-07-26 LAB — POCT INR: INR: 2.8 (ref 2.0–3.0)

## 2018-07-26 NOTE — Patient Instructions (Signed)
Continue taking 1 tablet (5mg ) every day except 1/2 tablet (2.5mg ) on Sundays.  (2.5mg ). Recheck INR in 4 weeks. Call if placed on any new medications [phone] or if you have any upcoming procedures.

## 2018-07-27 ENCOUNTER — Encounter: Payer: Self-pay | Admitting: Radiation Oncology

## 2018-07-27 ENCOUNTER — Other Ambulatory Visit: Payer: Self-pay

## 2018-07-27 ENCOUNTER — Ambulatory Visit
Admission: RE | Admit: 2018-07-27 | Discharge: 2018-07-27 | Disposition: A | Discharge status: Home / Self Care | Payer: Medicare Other | Source: Ambulatory Visit | Attending: Radiation Oncology | Admitting: Radiation Oncology

## 2018-07-27 VITALS — BP 132/74 | HR 94 | Temp 97.9°F | Resp 24

## 2018-07-27 DIAGNOSIS — Z79890 Hormone replacement therapy (postmenopausal): Secondary | ICD-10-CM | POA: Insufficient documentation

## 2018-07-27 DIAGNOSIS — C541 Malignant neoplasm of endometrium: Secondary | ICD-10-CM | POA: Insufficient documentation

## 2018-07-27 DIAGNOSIS — Z79899 Other long term (current) drug therapy: Secondary | ICD-10-CM | POA: Diagnosis not present

## 2018-07-27 DIAGNOSIS — Z923 Personal history of irradiation: Secondary | ICD-10-CM | POA: Diagnosis not present

## 2018-07-27 DIAGNOSIS — Z7901 Long term (current) use of anticoagulants: Secondary | ICD-10-CM | POA: Diagnosis not present

## 2018-07-27 NOTE — Progress Notes (Signed)
  Radiation Oncology         [phone] ________________________________  Name: Charlotte Castro MRN: [id number]  Date: 07/27/2018  DOB: 06/28/1932  Follow-Up Visit Note  CC: Leeroy Cha, MD  Everitt Amber, MD    ICD-10-CM   1. Endometrial cancer ([place]) C54.1     Diagnosis:   Stage IB, grade I endometrioid adenocarcinoma,(with isolated tumor cells in the SLN's)     Interval Since Last Radiation:  7 months   11/14/2017 - 12/20/2017: Pelvis / 45 Gy in 25 fractions of 1.8 Gy. (IMRT) HDR 7/23, 7/30, 8/6: Vaginal Cuff / 18 Gy in 3 fractions of 6 Gy  Narrative:  The patient returns today for routine follow-up. She last saw Dr. Denman George on 04/17/2018 and is scheduled for follow up in May 2020. She is very anxious today, with tremors and pressured speech. She states she does not currently have a PCP due to her previous one moving.  She reports arthritis in her left knee and lower leg. She denies dysuria or hematuria and vaginal discharge or bleeding. She reports her previous radiation-induced diarrhea has resolved.                 ALLERGIES:  is allergic to iodinated diagnostic agents and penicillins.  Meds: Current Outpatient Medications  Medication Sig Dispense Refill  . metoprolol tartrate (LOPRESSOR) 50 MG tablet Take 1 tablet (50 mg total) by mouth 2 (two) times daily with a meal. 60 tablet 6  . warfarin (COUMADIN) 5 MG tablet 1/2 TO 1 TABLET DAILY AS DIRECTED BY COUMADIN CLINIC 30 tablet 2  . levothyroxine (SYNTHROID, LEVOTHROID) 88 MCG tablet Take 1 tablet (88 mcg total) by mouth daily before breakfast. (Patient not taking: Reported on 07/27/2018) 30 tablet 0   No current facility-administered medications for this encounter.     Physical Findings: The patient is in no acute distress. Patient is alert and oriented.  oral temperature is 97.9 F (36.6 C). Her blood pressure is 132/74 and her pulse is 94. Her respiration is 24 (abnormal) and oxygen saturation is 100%.  Lungs are  clear to auscultation bilaterally. Heart has regular rate and rhythm. No palpable cervical, supraclavicular, or axillary adenopathy. Abdomen soft, non-tender, normal bowel sounds.  On pelvic examination the external genitalia were unremarkable. A speculum exam was performed. There are no mucosal lesions noted in the vaginal vault. Vaginal cuff intact. Some radiation changes noted to the proximal vagina.On bimanual  examination there were no pelvic masses appreciated.    Lab Findings: Lab Results  Component Value Date   WBC 4.8 11/22/2017   HGB 14.0 11/22/2017   HCT 40.5 11/22/2017   MCV 96.2 11/22/2017   PLT 174 11/22/2017    Radiographic Findings: No results found.  Impression:  Stage IB, grade I endometrioid adenocarcinoma No evidence of recurrence on clinical exam.   Plan:  Patient will follow up with Dr. Denman George in 3 months and with Radiation Oncology in 6 months. Encouraged the patient to obtain a primary care physician.  ____________________________________   Blair Promise, PhD, MD   This document serves as a record of services personally performed by Gery Pray, MD. It was created on his behalf by Wilburn Mylar, a trained medical scribe. The creation of this record is based on the scribe's personal observations and the provider's statements to them. This document has been checked and approved by the attending provider.

## 2018-07-27 NOTE — Progress Notes (Signed)
Pt presents today for f/u with Dr. Sondra Come. Pt is anxious with pressured speech. Pt is tremulous. Pt states she does not have a PCP because she wants a woman and the one Dr. Denman George recommended is too far from her home. Pt reports arthritis in left knee and lower leg. Pt reports that she is unable to be weighed or transfer to exam table since she will not be able to spread her knees for pelvic exam. Pt reports that "the radium" has caused her face to sink in and skin on face to be dry. Pt was offered transfer to ED for anxiety but pt declined.   Pt denies dysuria/hematuria. Pt denies vaginal bleeding/discharge. Pt reports that radiation induced diarrhea has resolved.   Pt has asked for medication for anxiety multiple times. This RN has emphasized with each question that pt needs to find a PCP. Pt with multiple excuses as to why she does not have a PCP.   BP 132/74 (BP Location: Right Arm, Patient Position: Sitting)   Pulse 94   Temp 97.9 F (36.6 C) (Oral)   Resp (!) 24   SpO2 100%   Wt Readings from Last 3 Encounters:  05/02/18 196 lb 12.8 oz (89.3 kg)  04/17/18 196 lb (88.9 kg)  02/20/18 189 lb 6 oz (85.9 kg)   Loma Sousa, RN BSN

## 2018-07-28 ENCOUNTER — Telehealth: Payer: Self-pay | Admitting: *Deleted

## 2018-07-28 NOTE — Telephone Encounter (Signed)
CALLED PATIENT TO INFORM OF 6 MONTH FU ON 01-25-19 @ 11 AM, SPOKE WITH PATIENT AND SHE IS AWARE OF THIS APPT.

## 2018-07-31 ENCOUNTER — Telehealth: Payer: Self-pay | Admitting: *Deleted

## 2018-07-31 NOTE — Telephone Encounter (Signed)
Patient called requesting the phone number for Dr Rodena Goldmann at [facility]. Gave the patient the number to the office.

## 2018-08-01 NOTE — Telephone Encounter (Signed)
Please let pt know I am part time now and have a very full panel.  Would recommend scheduling with Micheline Rough (family medicine) or Dr. Jerilee Hoh (internal medicine) - both very fine physicians if possible.

## 2018-08-01 NOTE — Telephone Encounter (Signed)
Copied from [place] [id number]. Topic: Appointment Scheduling - New Patient >> Jul 31, 2018  4:41 PM Reyne Dumas L wrote: Pt calling - states that Dr. Denman George recommended she call and see if Dr. Maudie Mercury would be able to take her on as a patient. Pt has MCR and AARP, and can be reached at [phone]

## 2018-08-02 NOTE — Telephone Encounter (Signed)
Noted  

## 2018-08-02 NOTE — Telephone Encounter (Signed)
Scheduled patient a NP appt to see Dr Jerilee Hoh.

## 2018-08-08 ENCOUNTER — Encounter: Payer: Self-pay | Admitting: Internal Medicine

## 2018-08-08 ENCOUNTER — Ambulatory Visit (INDEPENDENT_AMBULATORY_CARE_PROVIDER_SITE_OTHER): Payer: Medicare Other | Admitting: Internal Medicine

## 2018-08-08 ENCOUNTER — Other Ambulatory Visit: Payer: Self-pay | Admitting: Internal Medicine

## 2018-08-08 VITALS — BP 130/84 | HR 94 | Temp 97.9°F | Ht 68.0 in | Wt 202.8 lb

## 2018-08-08 DIAGNOSIS — M5432 Sciatica, left side: Secondary | ICD-10-CM

## 2018-08-08 DIAGNOSIS — E538 Deficiency of other specified B group vitamins: Secondary | ICD-10-CM | POA: Insufficient documentation

## 2018-08-08 DIAGNOSIS — Z7901 Long term (current) use of anticoagulants: Secondary | ICD-10-CM

## 2018-08-08 DIAGNOSIS — E039 Hypothyroidism, unspecified: Secondary | ICD-10-CM

## 2018-08-08 DIAGNOSIS — I5032 Chronic diastolic (congestive) heart failure: Secondary | ICD-10-CM

## 2018-08-08 DIAGNOSIS — F419 Anxiety disorder, unspecified: Secondary | ICD-10-CM

## 2018-08-08 DIAGNOSIS — I10 Essential (primary) hypertension: Secondary | ICD-10-CM | POA: Diagnosis not present

## 2018-08-08 DIAGNOSIS — C541 Malignant neoplasm of endometrium: Secondary | ICD-10-CM | POA: Diagnosis not present

## 2018-08-08 DIAGNOSIS — I4821 Permanent atrial fibrillation: Secondary | ICD-10-CM

## 2018-08-08 LAB — TSH: TSH: 11.11 u[IU]/mL — ABNORMAL HIGH (ref 0.35–4.50)

## 2018-08-08 LAB — VITAMIN B12: Vitamin B-12: 175 pg/mL — ABNORMAL LOW (ref 211–911)

## 2018-08-08 MED ORDER — LEVOTHYROXINE SODIUM 88 MCG PO TABS: 88.0000 ug | ORAL_TABLET | Freq: Every day | ORAL | 3 refills | Status: DC

## 2018-08-08 NOTE — Progress Notes (Addendum)
Established Patient Office Visit     CC/Reason for Visit: transfer of care, follow up on chronic medical conditions  HPI: Charlotte Castro is a 85 y.o. female who is coming in today for the above mentioned reason. Past Medical History is significant for endometrial cancer, chronic diastolic CHF, hypertension, atrial fib and anxiety.  Patient c/o anxiety due to having a lot of providers that she has to leave the house and see and this frustrates her.  Patient follows up with Oncology in May.  Patient was having radiation with Dr. Sondra Come for endometrial cancer and has finished treatment.  Patient also had a total hystorectomy.  Chemo was not recommended.  Patient has atrial fib and takes coumadin. Patient goes to a coumadin clinic each month.  Patient sees Dr. Scarlette Calico for cardiology.  She also has hypertension and diastolic CHF as well.  She follows up in 2 months.  Patient c/o left sided pain that radiates to the back of her knee area.  She says the pain also radiates to her left buttox as well.  She take tylenol without much relief.  She says this is a chronic issues and her pain seems to continue.  Patient has a hx of arthritis.Patient has a hx of hypothyroid, but has not had a TSH drawn in years.  She hasnt taken her synthyroid in 3 weeks due to being out of medicine.  Was on 88 mcg.  Thought process is very tangential and she is difficult to direct. She seems frustrated about the amount of doctors she has to see, but to my knowledge, she only sees MDs that are involved in her endometrial cancer :onc and rad onc, as well as her cardiologist biannually.  Does not appear to be a transportation issue. She states that she will probably not come back for her physical as she "already sees too many doctors".   Past Medical/Surgical History: Past Medical History:  Diagnosis Date  . Anemia    history of  . Anxiety   . Arthritis    "left knee" (04/05/2017)  . Atrial fibrillation ([place])   . Atrial  flutter ([place])    typical appearing  . Atrial tachycardia ([place])    ablated 11/17/10  by JA  from the [facility] of the aorta  . Chest pain on exertion   . CHF (congestive heart failure) ([place])   . Chronic lower back pain   . Chronic nausea   . Dizziness    chronic and of an unclear etiology  . Dyspnea   . Endometrial cancer ([facility])    grade 1  . Gallstone pancreatitis   . Gastritis   . GERD (gastroesophageal reflux disease)   . History of blood transfusion ~ 1948  . History of hiatal hernia   . HTN (hypertension)   . Hyperlipemia   . Hypothyroidism   . Left leg numbness   . Obesity   . Osteoporosis   . PMB (postmenopausal bleeding)   . Sinus headache   . Toe ulcer ([place])    left 3rd toe  . Vitamin D deficiency     Past Surgical History:  Procedure Laterality Date  . ATRIAL ABLATION SURGERY  11/17/10   Atrial tachycardia arising from [facility] of the aorta ablated by JA  . BACK SURGERY    . CARDIAC CATHETERIZATION  01/11/2011   Archie Endo 01/12/2011  . CHOLECYSTECTOMY N/A 04/08/2017   Procedure: LAPAROSCOPIC CHOLECYSTECTOMY;  Surgeon: Georganna Skeans, MD;  Location: [facility];  Service:  General;  Laterality: N/A;  . FRACTURE SURGERY    . HYSTEROSCOPY W/D&C N/A 08/05/2017   Procedure: DILATATION AND CURETTAGE /HYSTEROSCOPY AND POLYPECTOMY;  Surgeon: Osborne Oman, MD;  Location: [facility];  Service: Gynecology;  Laterality: N/A;  . LUMBAR SPINE SURGERY  ~ 1998  . ROBOTIC ASSISTED TOTAL HYSTERECTOMY WITH BILATERAL SALPINGO OOPHERECTOMY Bilateral 09/20/2017   Procedure: XI ROBOTIC ASSISTED TOTAL HYSTERECTOMY WITH BILATERAL SALPINGO OOPHORECTOMY, SENTINAL LYMPH NODE BIOPSY;  Surgeon: Everitt Amber, MD;  Location: [facility];  Service: Gynecology;  Laterality: Bilateral;  . SHOULDER SURGERY Right 1984 X2   Rollene Rotunda 01/19/2011  . WRIST FRACTURE SURGERY Left 1983    Social History:  reports that she has never smoked. She has never used smokeless tobacco. She reports that she does not drink alcohol or use  drugs.  Allergies: Allergies  Allergen Reactions  . Iodinated Diagnostic Agents Shortness Of Breath    headache  . Penicillins Other (See Comments)    Tolerated Zosyn Oct 2018    Family History:  Family History  Problem Relation Age of Onset  . Heart attack Father   . Hypertension Father      Current Outpatient Medications:  .  levothyroxine (SYNTHROID, LEVOTHROID) 88 MCG tablet, Take 1 tablet (88 mcg total) by mouth daily before breakfast., Disp: 30 tablet, Rfl: 0 .  metoprolol tartrate (LOPRESSOR) 50 MG tablet, Take 1 tablet (50 mg total) by mouth 2 (two) times daily with a meal., Disp: 60 tablet, Rfl: 6 .  warfarin (COUMADIN) 5 MG tablet, 1/2 TO 1 TABLET DAILY AS DIRECTED BY COUMADIN CLINIC, Disp: 30 tablet, Rfl: 2  Review of Systems:  Constitutional: Denies fever, chills, diaphoresis, appetite change and fatigue.  HEENT: Denies photophobia, eye pain, redness, hearing loss, ear pain, congestion, sore throat, rhinorrhea, sneezing, mouth sores, trouble swallowing, neck pain, neck stiffness and tinnitus.   Respiratory: Denies SOB, DOE, cough, chest tightness,  and wheezing.   Cardiovascular: Denies chest pain, palpitations and leg swelling.  Gastrointestinal: Denies nausea, vomiting, abdominal pain, diarrhea, constipation, blood in stool and abdominal distention.  Genitourinary: Denies dysuria, urgency, frequency, hematuria, flank pain and difficulty urinating.  Endocrine: Denies: hot or cold intolerance, sweats, changes in hair or nails, polyuria, polydipsia. Musculoskeletal: Denies, back pain, joint swelling,  and gait problem. C/o chronic left leg pain Skin: Denies pallor, rash and wound.  Neurological: Denies dizziness, seizures, syncope, weakness, light-headedness, numbness and headaches.  Hematological: Denies adenopathy. Easy bruising, personal or family bleeding history  Psychiatric/Behavioral: Denies suicidal ideation, mood changes, confusion, sleep disturbance and  agitation.  C/o being anxious   Physical Exam: Vitals:   08/08/18 0935  BP: 130/84  Pulse: 94  Temp: 97.9 F (36.6 C)  TempSrc: Oral  SpO2: 97%  Weight: 202 lb 12.8 oz (92 kg)  Height: 5\' 8"  (1.727 m)    Body mass index is 30.84 kg/m.   Constitutional: NAD Eyes: PERRL, lids and conjunctivae normal Respiratory: clear to auscultation bilaterally, no wheezing, no crackles. Normal respiratory effort. No accessory muscle use. SHOB with pressured speech and exertion Cardiovascular: Regular rate and rhythm, no murmurs / rubs / gallops. No extremity edema.  Musculoskeletal: no clubbing / cyanosis. No joint deformity upper and lower extremities. no contractures. Normal muscle tone. Uses a cane to ambulate Psychiatric:  Alert and oriented x 3.  Seemed very anxious.  Pressured speech, not easily redirected.  Tangential thoughts.   Impression and Plan:  Long term current use of anticoagulant therapy/chronic a fib -attends coumadin clinic monthly -  on metoprolol, follows with cardiology routinely.  Endometrial cancer ([place]) -  -follows up with oncology and rad onc  Chronic diastolic CHF (congestive heart failure) ([place]) -  -follows up with cardio on a regular basis -stable, compensated  Essential hypertension, benign -  -well controlled  Left sided sciatica -  -TSH, Vitamin B12 -take tylenol as needed for pain -back stretches and local therapy recommended. Would like her to attend PT but she refuses.  B12 deficiency -  Vitamin B12 level today as it may be playing a role in her left leg aching and pain.  Hypothyroidism -Check TSH, consider restarting synthroid if needed. -previous dose was 88 mcg    Patient Instructions  Nice meeting you today.  Instructions: -Schedule your annual physical exam, come fasting, so we can draw blood work. -Labs today, will notify you when results are available. -Would recommend that you consider physical therapy for your back  issues.   Sciatica  Sciatica is pain, numbness, weakness, or tingling along your sciatic nerve. The sciatic nerve starts in the lower back and goes down the back of each leg. Sciatica happens when this nerve is pinched or has pressure put on it. Sciatica usually goes away on its own or with treatment. Sometimes, sciatica may keep coming back (recur). Follow these instructions at home: Medicines  Take over-the-counter and prescription medicines only as told by your doctor.  Do not drive or use heavy machinery while taking prescription pain medicine. Managing pain  If directed, put ice on the affected area. ? Put ice in a plastic bag. ? Place a towel between your skin and the bag. ? Leave the ice on for 20 minutes, 2-3 times a day.  After icing, apply heat to the affected area before you exercise or as often as told by your doctor. Use the heat source that your doctor tells you to use, such as a moist heat pack or a heating pad. ? Place a towel between your skin and the heat source. ? Leave the heat on for 20-30 minutes. ? Remove the heat if your skin turns bright red. This is especially important if you are unable to feel pain, heat, or cold. You may have a greater risk of getting burned. Activity  Return to your normal activities as told by your doctor. Ask your doctor what activities are safe for you. ? Avoid activities that make your sciatica worse.  Take short rests during the day. Rest in a lying or standing position. This is usually better than sitting to rest. ? When you rest for a long time, do some physical activity or stretching between periods of rest. ? Avoid sitting for a long time without moving. Get up and move around at least one time each hour.  Exercise and stretch regularly, as told by your doctor.  Do not lift anything that is heavier than 10 lb (4.5 kg) while you have symptoms of sciatica. ? Avoid lifting heavy things even when you do not have symptoms. ? Avoid  lifting heavy things over and over.  When you lift objects, always lift in a way that is safe for your body. To do this, you should: ? Bend your knees. ? Keep the object close to your body. ? Avoid twisting. General instructions  Use good posture. ? Avoid leaning forward when you are sitting. ? Avoid hunching over when you are standing.  Stay at a healthy weight.  Wear comfortable shoes that support your feet. Avoid wearing high heels.  Avoid sleeping on a mattress that is too soft or too hard. You might have less pain if you sleep on a mattress that is firm enough to support your back.  Keep all follow-up visits as told by your doctor. This is important. Contact a doctor if:  You have pain that: ? Wakes you up when you are sleeping. ? Gets worse when you lie down. ? Is worse than the pain you have had in the past. ? Lasts longer than 4 weeks.  You lose weight for without trying. Get help right away if:  You cannot control when you pee (urinate) or poop (have a bowel movement).  You have weakness in any of these areas and it gets worse. ? Lower back. ? Lower belly (pelvis). ? Butt (buttocks). ? Legs.  You have redness or swelling of your back.  You have a burning feeling when you pee. This information is not intended to replace advice given to you by your health care provider. Make sure you discuss any questions you have with your health care provider. Document Released: 03/09/2008 Document Revised: 11/06/2015 Document Reviewed: 02/07/2015 Elsevier Interactive Patient Education  2019 [place].   Back Exercises If you have pain in your back, do these exercises 2-3 times each day or as told by your doctor. When the pain goes away, do the exercises once each day, but repeat the steps more times for each exercise (do more repetitions). If you do not have pain in your back, do these exercises once each day or as told by your doctor. Exercises Single Knee to Chest Do  these steps 3-5 times in a row for each leg: 1. Lie on your back on a firm bed or the floor with your legs stretched out. 2. Bring one knee to your chest. 3. Hold your knee to your chest by grabbing your knee or thigh. 4. Pull on your knee until you feel a gentle stretch in your lower back. 5. Keep doing the stretch for 10-30 seconds. 6. Slowly let go of your leg and straighten it. Pelvic Tilt Do these steps 5-10 times in a row: 1. Lie on your back on a firm bed or the floor with your legs stretched out. 2. Bend your knees so they point up to the ceiling. Your feet should be flat on the floor. 3. Tighten your lower belly (abdomen) muscles to press your lower back against the floor. This will make your tailbone point up to the ceiling instead of pointing down to your feet or the floor. 4. Stay in this position for 5-10 seconds while you gently tighten your muscles and breathe evenly. Cat-Cow Do these steps until your lower back bends more easily: 1. Get on your hands and knees on a firm surface. Keep your hands under your shoulders, and keep your knees under your hips. You may put padding under your knees. 2. Let your head hang down, and make your tailbone point down to the floor so your lower back is round like the back of a cat. 3. Stay in this position for 5 seconds. 4. Slowly lift your head and make your tailbone point up to the ceiling so your back hangs low (sags) like the back of a cow. 5. Stay in this position for 5 seconds.  Press-Ups Do these steps 5-10 times in a row: 1. Lie on your belly (face-down) on the floor. 2. Place your hands near your head, about shoulder-width apart. 3. While you keep your back  relaxed and keep your hips on the floor, slowly straighten your arms to raise the top half of your body and lift your shoulders. Do not use your back muscles. To make yourself more comfortable, you may change where you place your hands. 4. Stay in this position for 5  seconds. 5. Slowly return to lying flat on the floor.  Bridges Do these steps 10 times in a row: 1. Lie on your back on a firm surface. 2. Bend your knees so they point up to the ceiling. Your feet should be flat on the floor. 3. Tighten your butt muscles and lift your butt off of the floor until your waist is almost as high as your knees. If you do not feel the muscles working in your butt and the back of your thighs, slide your feet 1-2 inches farther away from your butt. 4. Stay in this position for 3-5 seconds. 5. Slowly lower your butt to the floor, and let your butt muscles relax. If this exercise is too easy, try doing it with your arms crossed over your chest. Belly Crunches Do these steps 5-10 times in a row: 1. Lie on your back on a firm bed or the floor with your legs stretched out. 2. Bend your knees so they point up to the ceiling. Your feet should be flat on the floor. 3. Cross your arms over your chest. 4. Tip your chin a little bit toward your chest but do not bend your neck. 5. Tighten your belly muscles and slowly raise your chest just enough to lift your shoulder blades a tiny bit off of the floor. 6. Slowly lower your chest and your head to the floor. Back Lifts Do these steps 5-10 times in a row: 1. Lie on your belly (face-down) with your arms at your sides, and rest your forehead on the floor. 2. Tighten the muscles in your legs and your butt. 3. Slowly lift your chest off of the floor while you keep your hips on the floor. Keep the back of your head in line with the curve in your back. Look at the floor while you do this. 4. Stay in this position for 3-5 seconds. 5. Slowly lower your chest and your face to the floor. Contact a doctor if:  Your back pain gets a lot worse when you do an exercise.  Your back pain does not lessen 2 hours after you exercise. If you have any of these problems, stop doing the exercises. Do not do them again unless your doctor says it is  okay. Get help right away if:  You have sudden, very bad back pain. If this happens, stop doing the exercises. Do not do them again unless your doctor says it is okay. This information is not intended to replace advice given to you by your health care provider. Make sure you discuss any questions you have with your health care provider. Document Released: 07/03/2010 Document Revised: 02/22/2018 Document Reviewed: 07/25/2014 Elsevier Interactive Patient Education  2019 [place], RN DNP Student [facility]

## 2018-08-08 NOTE — Patient Instructions (Addendum)
Nice meeting you today.  Instructions: -Schedule your annual physical exam, come fasting, so we can draw blood work. -Labs today, will notify you when results are available. -Would recommend that you consider physical therapy for your back issues.   Sciatica  Sciatica is pain, numbness, weakness, or tingling along your sciatic nerve. The sciatic nerve starts in the lower back and goes down the back of each leg. Sciatica happens when this nerve is pinched or has pressure put on it. Sciatica usually goes away on its own or with treatment. Sometimes, sciatica may keep coming back (recur). Follow these instructions at home: Medicines  Take over-the-counter and prescription medicines only as told by your doctor.  Do not drive or use heavy machinery while taking prescription pain medicine. Managing pain  If directed, put ice on the affected area. ? Put ice in a plastic bag. ? Place a towel between your skin and the bag. ? Leave the ice on for 20 minutes, 2-3 times a day.  After icing, apply heat to the affected area before you exercise or as often as told by your doctor. Use the heat source that your doctor tells you to use, such as a moist heat pack or a heating pad. ? Place a towel between your skin and the heat source. ? Leave the heat on for 20-30 minutes. ? Remove the heat if your skin turns bright red. This is especially important if you are unable to feel pain, heat, or cold. You may have a greater risk of getting burned. Activity  Return to your normal activities as told by your doctor. Ask your doctor what activities are safe for you. ? Avoid activities that make your sciatica worse.  Take short rests during the day. Rest in a lying or standing position. This is usually better than sitting to rest. ? When you rest for a long time, do some physical activity or stretching between periods of rest. ? Avoid sitting for a long time without moving. Get up and move around at least one  time each hour.  Exercise and stretch regularly, as told by your doctor.  Do not lift anything that is heavier than 10 lb (4.5 kg) while you have symptoms of sciatica. ? Avoid lifting heavy things even when you do not have symptoms. ? Avoid lifting heavy things over and over.  When you lift objects, always lift in a way that is safe for your body. To do this, you should: ? Bend your knees. ? Keep the object close to your body. ? Avoid twisting. General instructions  Use good posture. ? Avoid leaning forward when you are sitting. ? Avoid hunching over when you are standing.  Stay at a healthy weight.  Wear comfortable shoes that support your feet. Avoid wearing high heels.  Avoid sleeping on a mattress that is too soft or too hard. You might have less pain if you sleep on a mattress that is firm enough to support your back.  Keep all follow-up visits as told by your doctor. This is important. Contact a doctor if:  You have pain that: ? Wakes you up when you are sleeping. ? Gets worse when you lie down. ? Is worse than the pain you have had in the past. ? Lasts longer than 4 weeks.  You lose weight for without trying. Get help right away if:  You cannot control when you pee (urinate) or poop (have a bowel movement).  You have weakness in any of these  areas and it gets worse. ? Lower back. ? Lower belly (pelvis). ? Butt (buttocks). ? Legs.  You have redness or swelling of your back.  You have a burning feeling when you pee. This information is not intended to replace advice given to you by your health care provider. Make sure you discuss any questions you have with your health care provider. Document Released: 03/09/2008 Document Revised: 11/06/2015 Document Reviewed: 02/07/2015 Elsevier Interactive Patient Education  2019 [employer].   Back Exercises If you have pain in your back, do these exercises 2-3 times each day or as told by your doctor. When the pain goes  away, do the exercises once each day, but repeat the steps more times for each exercise (do more repetitions). If you do not have pain in your back, do these exercises once each day or as told by your doctor. Exercises Single Knee to Chest Do these steps 3-5 times in a row for each leg: 1. Lie on your back on a firm bed or the floor with your legs stretched out. 2. Bring one knee to your chest. 3. Hold your knee to your chest by grabbing your knee or thigh. 4. Pull on your knee until you feel a gentle stretch in your lower back. 5. Keep doing the stretch for 10-30 seconds. 6. Slowly let go of your leg and straighten it. Pelvic Tilt Do these steps 5-10 times in a row: 1. Lie on your back on a firm bed or the floor with your legs stretched out. 2. Bend your knees so they point up to the ceiling. Your feet should be flat on the floor. 3. Tighten your lower belly (abdomen) muscles to press your lower back against the floor. This will make your tailbone point up to the ceiling instead of pointing down to your feet or the floor. 4. Stay in this position for 5-10 seconds while you gently tighten your muscles and breathe evenly. Cat-Cow Do these steps until your lower back bends more easily: 1. Get on your hands and knees on a firm surface. Keep your hands under your shoulders, and keep your knees under your hips. You may put padding under your knees. 2. Let your head hang down, and make your tailbone point down to the floor so your lower back is round like the back of a cat. 3. Stay in this position for 5 seconds. 4. Slowly lift your head and make your tailbone point up to the ceiling so your back hangs low (sags) like the back of a cow. 5. Stay in this position for 5 seconds.  Press-Ups Do these steps 5-10 times in a row: 1. Lie on your belly (face-down) on the floor. 2. Place your hands near your head, about shoulder-width apart. 3. While you keep your back relaxed and keep your hips on the  floor, slowly straighten your arms to raise the top half of your body and lift your shoulders. Do not use your back muscles. To make yourself more comfortable, you may change where you place your hands. 4. Stay in this position for 5 seconds. 5. Slowly return to lying flat on the floor.  Bridges Do these steps 10 times in a row: 1. Lie on your back on a firm surface. 2. Bend your knees so they point up to the ceiling. Your feet should be flat on the floor. 3. Tighten your butt muscles and lift your butt off of the floor until your waist is almost as high as  your knees. If you do not feel the muscles working in your butt and the back of your thighs, slide your feet 1-2 inches farther away from your butt. 4. Stay in this position for 3-5 seconds. 5. Slowly lower your butt to the floor, and let your butt muscles relax. If this exercise is too easy, try doing it with your arms crossed over your chest. Belly Crunches Do these steps 5-10 times in a row: 1. Lie on your back on a firm bed or the floor with your legs stretched out. 2. Bend your knees so they point up to the ceiling. Your feet should be flat on the floor. 3. Cross your arms over your chest. 4. Tip your chin a little bit toward your chest but do not bend your neck. 5. Tighten your belly muscles and slowly raise your chest just enough to lift your shoulder blades a tiny bit off of the floor. 6. Slowly lower your chest and your head to the floor. Back Lifts Do these steps 5-10 times in a row: 1. Lie on your belly (face-down) with your arms at your sides, and rest your forehead on the floor. 2. Tighten the muscles in your legs and your butt. 3. Slowly lift your chest off of the floor while you keep your hips on the floor. Keep the back of your head in line with the curve in your back. Look at the floor while you do this. 4. Stay in this position for 3-5 seconds. 5. Slowly lower your chest and your face to the floor. Contact a doctor  if:  Your back pain gets a lot worse when you do an exercise.  Your back pain does not lessen 2 hours after you exercise. If you have any of these problems, stop doing the exercises. Do not do them again unless your doctor says it is okay. Get help right away if:  You have sudden, very bad back pain. If this happens, stop doing the exercises. Do not do them again unless your doctor says it is okay. This information is not intended to replace advice given to you by your health care provider. Make sure you discuss any questions you have with your health care provider. Document Released: 07/03/2010 Document Revised: 02/22/2018 Document Reviewed: 07/25/2014 Elsevier Interactive Patient Education  [employer].

## 2018-08-09 ENCOUNTER — Other Ambulatory Visit: Payer: Self-pay | Admitting: Internal Medicine

## 2018-08-09 DIAGNOSIS — E039 Hypothyroidism, unspecified: Secondary | ICD-10-CM

## 2018-08-09 DIAGNOSIS — R410 Disorientation, unspecified: Secondary | ICD-10-CM

## 2018-08-09 DIAGNOSIS — E538 Deficiency of other specified B group vitamins: Secondary | ICD-10-CM

## 2018-08-09 DIAGNOSIS — R251 Tremor, unspecified: Secondary | ICD-10-CM

## 2018-08-09 DIAGNOSIS — F419 Anxiety disorder, unspecified: Secondary | ICD-10-CM

## 2018-08-11 ENCOUNTER — Telehealth: Payer: Self-pay | Admitting: Internal Medicine

## 2018-08-11 NOTE — Telephone Encounter (Signed)
Charlotte Castro calling back and stated that callus on her left foot and would like to know if we would like [place] to treat it. Please advise

## 2018-08-11 NOTE — Telephone Encounter (Signed)
Copied from [place] [id number]. Topic: Quick Communication - Rx Refill/Question >> Aug 11, 2018  1:22 PM Scherrie Gerlach wrote: Medication: B12 Dorian Pod with [facility] is with the pt and the pt does not have any b12 to inject as her orders state 1 X wk.  B12 never called into pharmacy. However, ellen states the pt does NOT want to do injection anyway. Would like to know how much OTC B12 she can take? Dorian Pod would like a call back, ok to LM on secure VM.

## 2018-08-15 MED ORDER — CYANOCOBALAMIN 1000 MCG/ML IJ SOLN: INTRAMUSCULAR | 0 refills | Status: DC

## 2018-08-15 MED ORDER — "SYRINGE/NEEDLE (DISP) 25G X 1-1/2"" 3 ML MISC": 3 refills | Status: DC

## 2018-08-15 NOTE — Telephone Encounter (Signed)
Patient's daughter Jones Broom called stating that pt is in "an uproar" about b12 injections. Tammie would like to know if pt is able to take this in pill form. Please advise.

## 2018-08-15 NOTE — Telephone Encounter (Signed)
Spoke with patient and Rx for B12 sent to the pharmacy. Patient will try the prescription for "cream" on her foot.  If no improvement she will call back.  Spoke with Dorian Pod with [facility] [phone] and informed her of the information above.

## 2018-08-16 NOTE — Telephone Encounter (Signed)
Spoke with patient and explained that she will need B12 injections.  Patient verbally understood.  Patient is waiting on delivery of her prescription of B12.

## 2018-08-23 ENCOUNTER — Ambulatory Visit (INDEPENDENT_AMBULATORY_CARE_PROVIDER_SITE_OTHER): Payer: Medicare Other | Admitting: Pharmacist

## 2018-08-23 ENCOUNTER — Other Ambulatory Visit: Payer: Self-pay

## 2018-08-23 DIAGNOSIS — Z5181 Encounter for therapeutic drug level monitoring: Secondary | ICD-10-CM | POA: Diagnosis not present

## 2018-08-23 DIAGNOSIS — I4821 Permanent atrial fibrillation: Secondary | ICD-10-CM | POA: Diagnosis not present

## 2018-08-23 LAB — POCT INR: INR: 2.6 (ref 2.0–3.0)

## 2018-08-23 NOTE — Patient Instructions (Signed)
Description   Continue taking 1 tablet (5mg ) every day except 1/2 tablet (2.5mg ) on Sundays. Recheck INR in 4 weeks. Call if placed on any new medications [phone] or if you have any upcoming procedures.

## 2018-09-19 LAB — TSH: TSH: 2.69 (ref 0.41–5.90)

## 2018-09-20 ENCOUNTER — Telehealth: Payer: Self-pay | Admitting: *Deleted

## 2018-09-20 NOTE — Telephone Encounter (Addendum)
Received a voicemail regarding the pt from her dtr. Returned ca call to the dtr and left a message to call back as she stated she needs the pt's appt date and need to know who has been going to check her INR in the home since we are not doing so. Will await her to call back. Daughter called back and stated that her mom stated that a nurse came to her house yesterday and did some services for her. Dtr is aware that we don't do housecalls. She said that PCP didn't send anyone out and she is still unsure who it was. Advised that we have an appt for the pt on 09/26/2018.

## 2018-09-25 ENCOUNTER — Telehealth: Payer: Self-pay

## 2018-09-25 ENCOUNTER — Telehealth: Payer: Self-pay | Admitting: *Deleted

## 2018-09-25 ENCOUNTER — Encounter: Payer: Self-pay | Admitting: Internal Medicine

## 2018-09-25 NOTE — Telephone Encounter (Signed)

## 2018-09-25 NOTE — Telephone Encounter (Signed)
lmom for prescreen/drive thru 

## 2018-09-26 ENCOUNTER — Other Ambulatory Visit: Payer: Self-pay

## 2018-09-26 ENCOUNTER — Ambulatory Visit (INDEPENDENT_AMBULATORY_CARE_PROVIDER_SITE_OTHER): Payer: Medicare Other | Admitting: Pharmacist

## 2018-09-26 DIAGNOSIS — I4821 Permanent atrial fibrillation: Secondary | ICD-10-CM

## 2018-09-26 DIAGNOSIS — Z5181 Encounter for therapeutic drug level monitoring: Secondary | ICD-10-CM | POA: Diagnosis not present

## 2018-09-26 LAB — POCT INR: INR: 2.7 (ref 2.0–3.0)

## 2018-10-04 ENCOUNTER — Other Ambulatory Visit: Payer: Self-pay | Admitting: Interventional Cardiology

## 2018-10-04 DIAGNOSIS — I4821 Permanent atrial fibrillation: Secondary | ICD-10-CM

## 2018-10-04 DIAGNOSIS — I10 Essential (primary) hypertension: Secondary | ICD-10-CM

## 2018-10-05 ENCOUNTER — Telehealth: Payer: Self-pay | Admitting: *Deleted

## 2018-10-05 NOTE — Telephone Encounter (Signed)
Copied from [place] [id number]. Topic: General - Inquiry >> Oct 05, 2018  3:08 PM Rainey Pines A wrote: Reason for CRM: Trinace , patients home health nurse, would like a callback at [phone] in regards to patients B12 shots that are for patients sciatic nerve pain. Trinace would like to speak with Dr. Jerilee Hoh nurse.

## 2018-10-11 ENCOUNTER — Telehealth: Payer: Self-pay | Admitting: *Deleted

## 2018-10-11 NOTE — Telephone Encounter (Signed)
Called and left the patient a message to call the office back. Need to change the 5/6 appt to either a WebEx or phone visit

## 2018-10-12 NOTE — Telephone Encounter (Signed)
Patient returned the call and cancelled her appt. Patient stated "I'm having issues with my legs and want to cancel until I get better. I will call back to reschedule."

## 2018-10-18 ENCOUNTER — Ambulatory Visit: Payer: Medicare Other | Admitting: Gynecologic Oncology

## 2018-11-14 ENCOUNTER — Telehealth: Payer: Self-pay

## 2018-11-14 NOTE — Telephone Encounter (Signed)
lmom to move appt/covid screen/aware of in office visit

## 2018-11-15 NOTE — Telephone Encounter (Signed)

## 2018-11-30 ENCOUNTER — Ambulatory Visit (INDEPENDENT_AMBULATORY_CARE_PROVIDER_SITE_OTHER): Payer: Medicare Other | Admitting: Pharmacist

## 2018-11-30 ENCOUNTER — Other Ambulatory Visit: Payer: Self-pay

## 2018-11-30 DIAGNOSIS — Z5181 Encounter for therapeutic drug level monitoring: Secondary | ICD-10-CM

## 2018-11-30 DIAGNOSIS — I4821 Permanent atrial fibrillation: Secondary | ICD-10-CM

## 2018-11-30 LAB — POCT INR: INR: 2.3 (ref 2.0–3.0)

## 2018-11-30 NOTE — Patient Instructions (Signed)
Continue taking 1 tablet (5mg ) every day except 1/2 tablet (2.5mg ) on Sundays. Recheck INR in 8 weeks. Call if placed on any new medications [phone] or if you have any upcoming procedures.

## 2019-01-13 ENCOUNTER — Encounter (HOSPITAL_COMMUNITY): Payer: Self-pay

## 2019-01-13 ENCOUNTER — Inpatient Hospital Stay (HOSPITAL_COMMUNITY)
Admission: EM | Admit: 2019-01-13 | Discharge: 2019-01-19 | DRG: 682 | Disposition: A | Discharge status: Skilled Nursing Facility | Payer: Medicare Other | Attending: Internal Medicine | Admitting: Internal Medicine

## 2019-01-13 ENCOUNTER — Emergency Department (HOSPITAL_COMMUNITY): Payer: Medicare Other

## 2019-01-13 ENCOUNTER — Other Ambulatory Visit: Payer: Self-pay

## 2019-01-13 DIAGNOSIS — Z8589 Personal history of malignant neoplasm of other organs and systems: Secondary | ICD-10-CM

## 2019-01-13 DIAGNOSIS — F0390 Unspecified dementia without behavioral disturbance: Secondary | ICD-10-CM | POA: Diagnosis present

## 2019-01-13 DIAGNOSIS — R0602 Shortness of breath: Secondary | ICD-10-CM | POA: Diagnosis not present

## 2019-01-13 DIAGNOSIS — Z20828 Contact with and (suspected) exposure to other viral communicable diseases: Secondary | ICD-10-CM | POA: Diagnosis present

## 2019-01-13 DIAGNOSIS — I959 Hypotension, unspecified: Secondary | ICD-10-CM | POA: Diagnosis present

## 2019-01-13 DIAGNOSIS — F05 Delirium due to known physiological condition: Secondary | ICD-10-CM | POA: Diagnosis present

## 2019-01-13 DIAGNOSIS — N179 Acute kidney failure, unspecified: Principal | ICD-10-CM | POA: Diagnosis present

## 2019-01-13 DIAGNOSIS — I4821 Permanent atrial fibrillation: Secondary | ICD-10-CM | POA: Diagnosis present

## 2019-01-13 DIAGNOSIS — E876 Hypokalemia: Secondary | ICD-10-CM | POA: Diagnosis not present

## 2019-01-13 DIAGNOSIS — M545 Low back pain: Secondary | ICD-10-CM | POA: Diagnosis present

## 2019-01-13 DIAGNOSIS — Z8249 Family history of ischemic heart disease and other diseases of the circulatory system: Secondary | ICD-10-CM

## 2019-01-13 DIAGNOSIS — E039 Hypothyroidism, unspecified: Secondary | ICD-10-CM | POA: Diagnosis present

## 2019-01-13 DIAGNOSIS — R0902 Hypoxemia: Secondary | ICD-10-CM | POA: Diagnosis present

## 2019-01-13 DIAGNOSIS — G9341 Metabolic encephalopathy: Secondary | ICD-10-CM | POA: Diagnosis present

## 2019-01-13 DIAGNOSIS — R410 Disorientation, unspecified: Secondary | ICD-10-CM

## 2019-01-13 DIAGNOSIS — Z7901 Long term (current) use of anticoagulants: Secondary | ICD-10-CM

## 2019-01-13 DIAGNOSIS — R778 Other specified abnormalities of plasma proteins: Secondary | ICD-10-CM

## 2019-01-13 DIAGNOSIS — I4891 Unspecified atrial fibrillation: Secondary | ICD-10-CM

## 2019-01-13 DIAGNOSIS — R112 Nausea with vomiting, unspecified: Secondary | ICD-10-CM

## 2019-01-13 DIAGNOSIS — K219 Gastro-esophageal reflux disease without esophagitis: Secondary | ICD-10-CM | POA: Diagnosis present

## 2019-01-13 DIAGNOSIS — Z880 Allergy status to penicillin: Secondary | ICD-10-CM

## 2019-01-13 DIAGNOSIS — G8929 Other chronic pain: Secondary | ICD-10-CM | POA: Diagnosis present

## 2019-01-13 DIAGNOSIS — E559 Vitamin D deficiency, unspecified: Secondary | ICD-10-CM | POA: Diagnosis present

## 2019-01-13 DIAGNOSIS — I10 Essential (primary) hypertension: Secondary | ICD-10-CM | POA: Diagnosis present

## 2019-01-13 DIAGNOSIS — E785 Hyperlipidemia, unspecified: Secondary | ICD-10-CM | POA: Diagnosis present

## 2019-01-13 DIAGNOSIS — Z79890 Hormone replacement therapy (postmenopausal): Secondary | ICD-10-CM

## 2019-01-13 DIAGNOSIS — I110 Hypertensive heart disease with heart failure: Secondary | ICD-10-CM | POA: Diagnosis present

## 2019-01-13 DIAGNOSIS — M810 Age-related osteoporosis without current pathological fracture: Secondary | ICD-10-CM | POA: Diagnosis present

## 2019-01-13 DIAGNOSIS — F419 Anxiety disorder, unspecified: Secondary | ICD-10-CM | POA: Diagnosis present

## 2019-01-13 DIAGNOSIS — N390 Urinary tract infection, site not specified: Secondary | ICD-10-CM | POA: Diagnosis present

## 2019-01-13 DIAGNOSIS — R079 Chest pain, unspecified: Secondary | ICD-10-CM

## 2019-01-13 DIAGNOSIS — M1712 Unilateral primary osteoarthritis, left knee: Secondary | ICD-10-CM | POA: Diagnosis present

## 2019-01-13 DIAGNOSIS — I5032 Chronic diastolic (congestive) heart failure: Secondary | ICD-10-CM | POA: Diagnosis present

## 2019-01-13 DIAGNOSIS — K449 Diaphragmatic hernia without obstruction or gangrene: Secondary | ICD-10-CM | POA: Diagnosis present

## 2019-01-13 DIAGNOSIS — Z91041 Radiographic dye allergy status: Secondary | ICD-10-CM

## 2019-01-13 LAB — COMPREHENSIVE METABOLIC PANEL
ALT: 16 U/L (ref 0–44)
AST: 25 U/L (ref 15–41)
Albumin: 4 g/dL (ref 3.5–5.0)
Alkaline Phosphatase: 68 U/L (ref 38–126)
Anion gap: 13 (ref 5–15)
BUN: 17 mg/dL (ref 8–23)
CO2: 19 mmol/L — ABNORMAL LOW (ref 22–32)
Calcium: 10.2 mg/dL (ref 8.9–10.3)
Chloride: 107 mmol/L (ref 98–111)
Creatinine, Ser: 1.43 mg/dL — ABNORMAL HIGH (ref 0.44–1.00)
GFR calc Af Amer: 39 mL/min — ABNORMAL LOW (ref >60)
GFR calc non Af Amer: 34 mL/min — ABNORMAL LOW (ref >60)
Glucose, Bld: 122 mg/dL — ABNORMAL HIGH (ref 70–99)
Potassium: 4 mmol/L (ref 3.5–5.1)
Sodium: 139 mmol/L (ref 135–145)
Total Bilirubin: 1.1 mg/dL (ref 0.3–1.2)
Total Protein: 7 g/dL (ref 6.5–8.1)

## 2019-01-13 LAB — CBC WITH DIFFERENTIAL/PLATELET
Abs Immature Granulocytes: 0.03 10*3/uL (ref 0.00–0.07)
Basophils Absolute: 0.1 10*3/uL (ref 0.0–0.1)
Basophils Relative: 1 %
Eosinophils Absolute: 0.1 10*3/uL (ref 0.0–0.5)
Eosinophils Relative: 2 %
HCT: 45 % (ref 36.0–46.0)
Hemoglobin: 16 g/dL — ABNORMAL HIGH (ref 12.0–15.0)
Immature Granulocytes: 0 %
Lymphocytes Relative: 25 %
Lymphs Abs: 2 10*3/uL (ref 0.7–4.0)
MCH: 32.6 pg (ref 26.0–34.0)
MCHC: 35.6 g/dL (ref 30.0–36.0)
MCV: 91.6 fL (ref 80.0–100.0)
Monocytes Absolute: 0.8 10*3/uL (ref 0.1–1.0)
Monocytes Relative: 10 %
Neutro Abs: 5.1 10*3/uL (ref 1.7–7.7)
Neutrophils Relative %: 62 %
Platelets: 191 10*3/uL (ref 150–400)
RBC: 4.91 MIL/uL (ref 3.87–5.11)
RDW: 13.2 % (ref 11.5–15.5)
WBC: 8.2 10*3/uL (ref 4.0–10.5)
nRBC: 0 % (ref 0.0–0.2)

## 2019-01-13 LAB — URINALYSIS, ROUTINE W REFLEX MICROSCOPIC
Bilirubin Urine: NEGATIVE
Glucose, UA: NEGATIVE mg/dL
Ketones, ur: 5 mg/dL — AB
Nitrite: NEGATIVE
Protein, ur: 30 mg/dL — AB
Specific Gravity, Urine: 1.02 (ref 1.005–1.030)
pH: 5 (ref 5.0–8.0)

## 2019-01-13 LAB — TROPONIN I (HIGH SENSITIVITY)
Troponin I (High Sensitivity): 19 ng/L — ABNORMAL HIGH (ref <18)
Troponin I (High Sensitivity): 19 ng/L — ABNORMAL HIGH (ref <18)

## 2019-01-13 LAB — TSH: TSH: 5.28 u[IU]/mL — ABNORMAL HIGH (ref 0.350–4.500)

## 2019-01-13 LAB — PROTIME-INR
INR: 2.7 — ABNORMAL HIGH (ref 0.8–1.2)
Prothrombin Time: 28.1 seconds — ABNORMAL HIGH (ref 11.4–15.2)

## 2019-01-13 LAB — SARS CORONAVIRUS 2 BY RT PCR (HOSPITAL ORDER, PERFORMED IN ~~LOC~~ HOSPITAL LAB): SARS Coronavirus 2: NEGATIVE

## 2019-01-13 IMAGING — CR CHEST - 2 VIEW
2 series · 2 of 2 positions shown · non-contrast
Comparison: [DATE] chest radiograph and chest CT [DATE]

CLINICAL DATA: Shortness of breath and chest pain

EXAM:
CHEST - 2 VIEW

[chest lat]
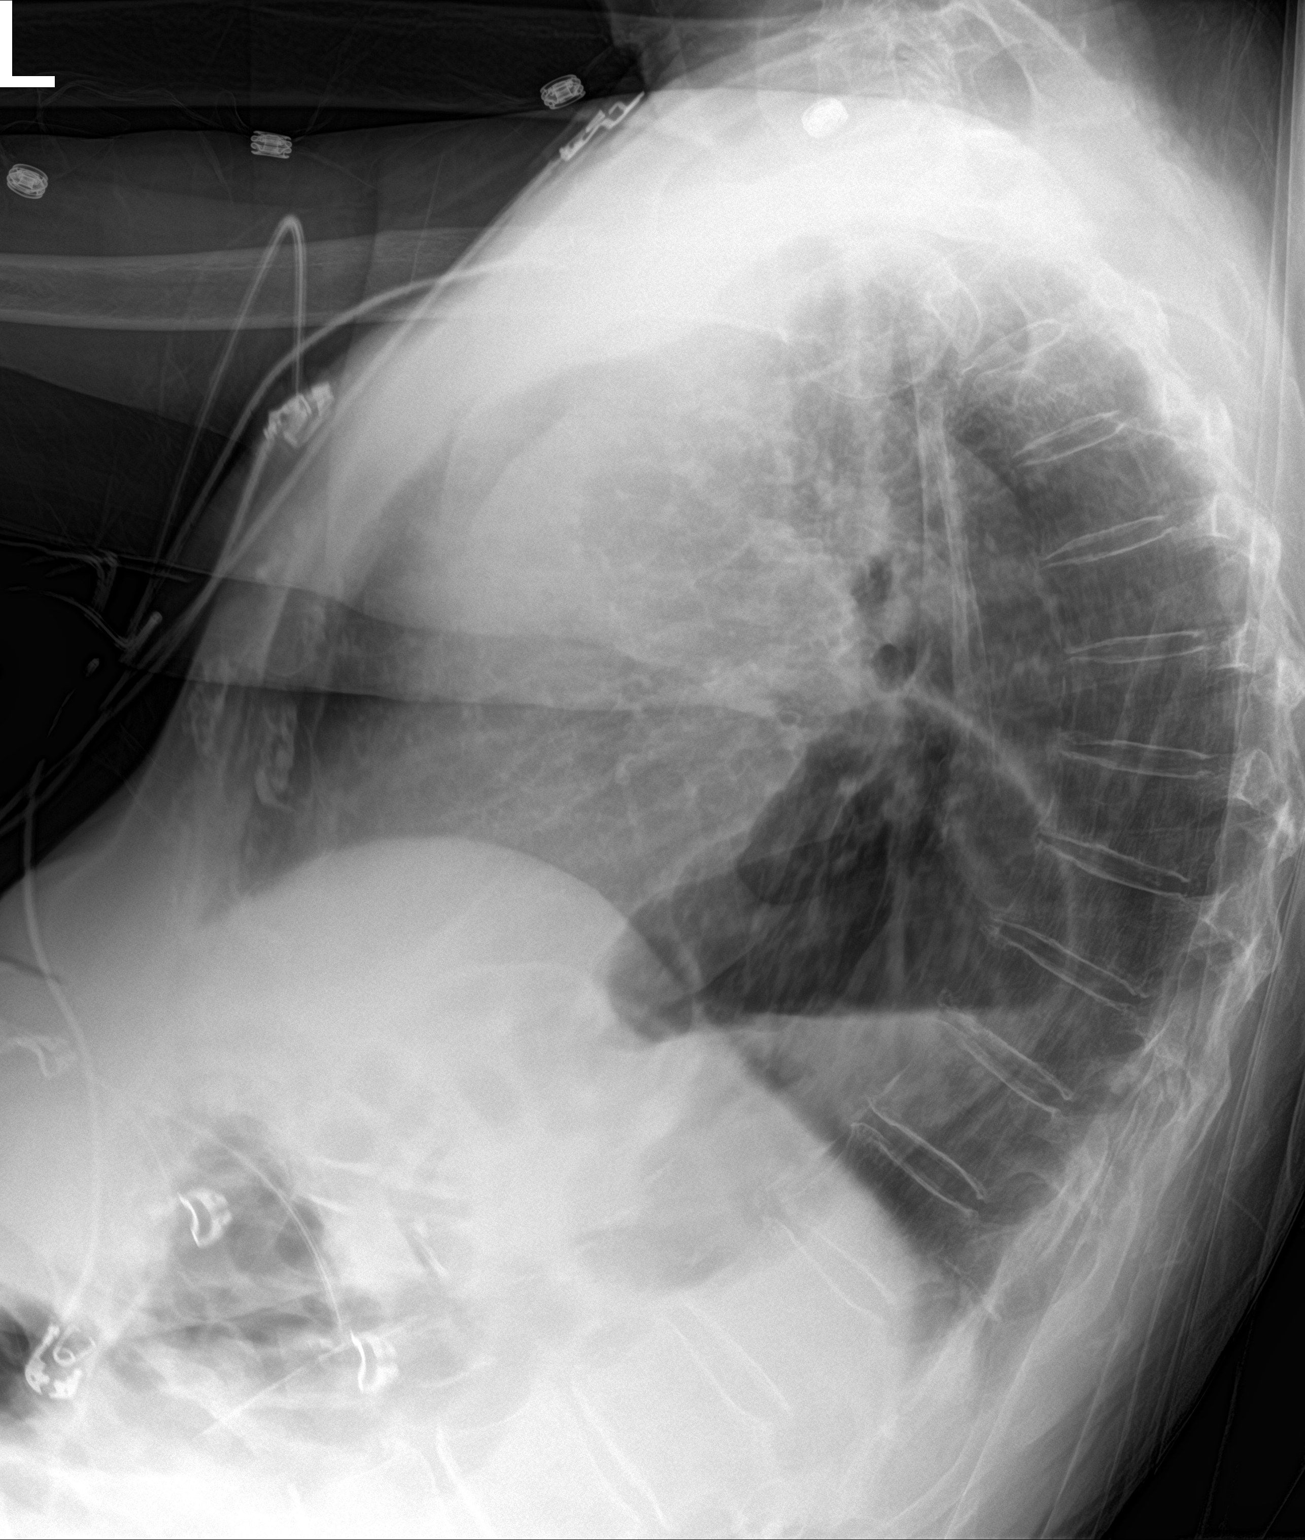

[chest ap]
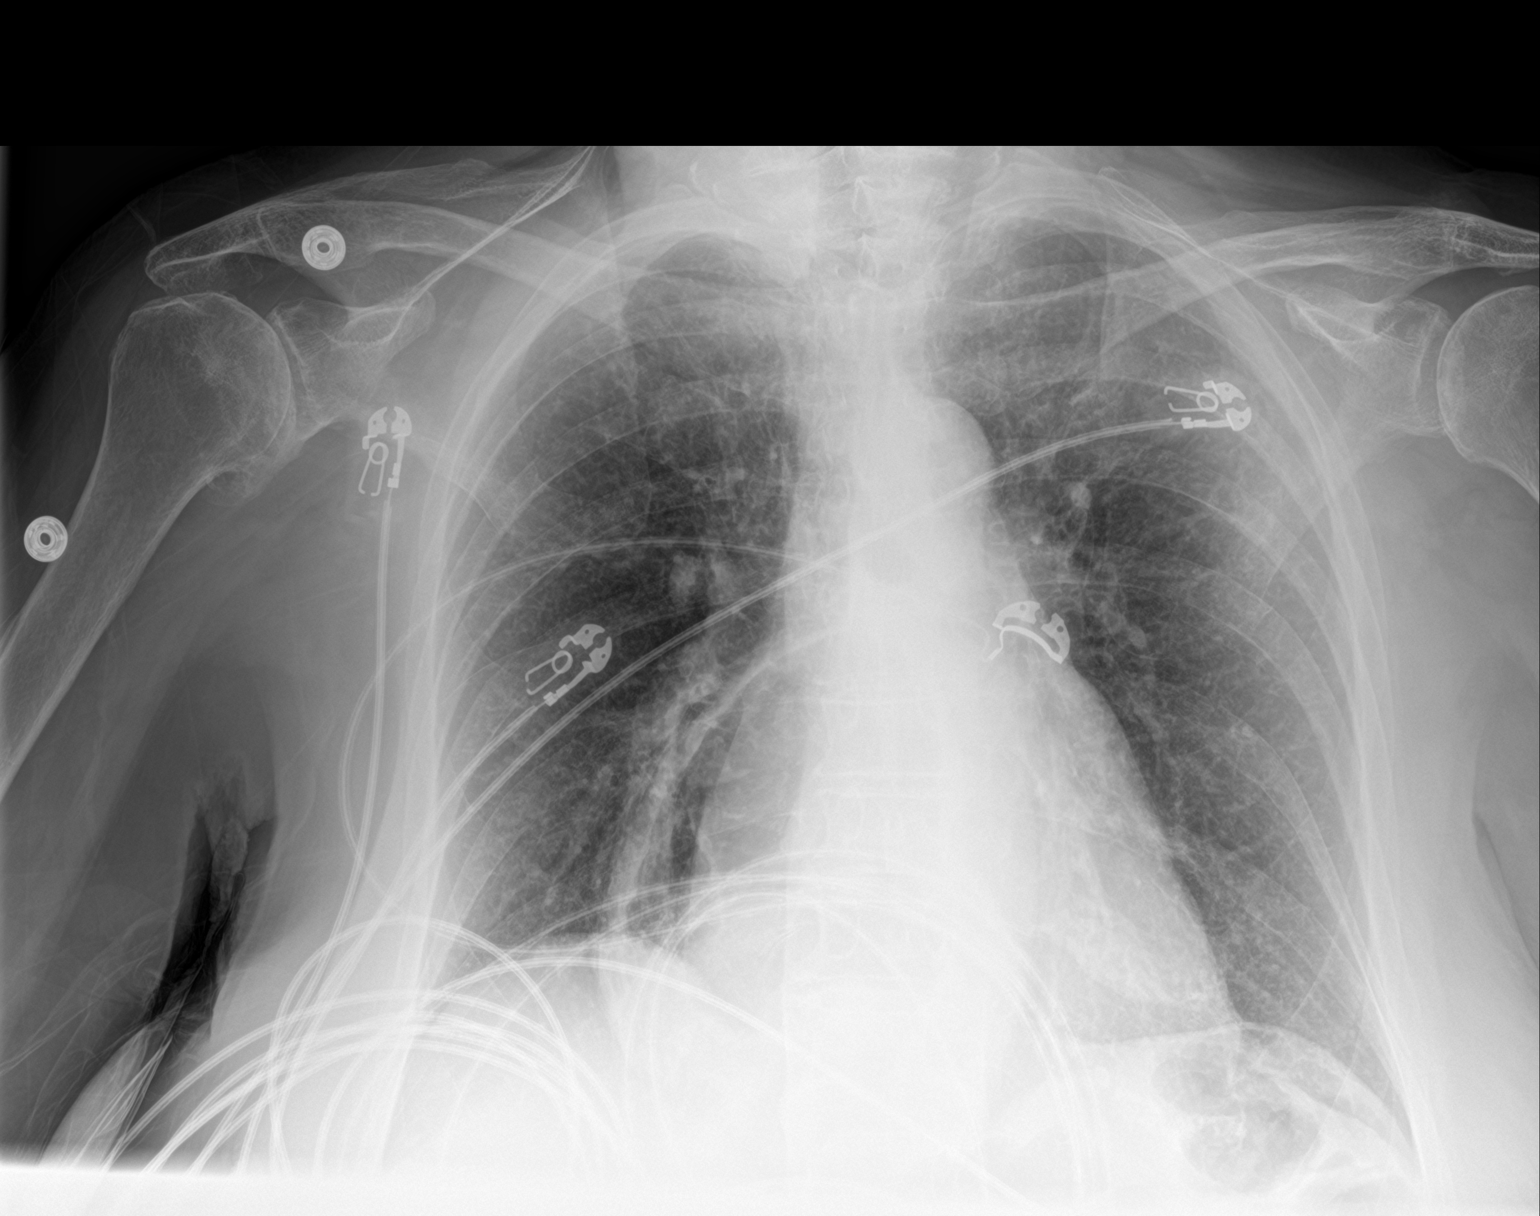

[2 of 2 positions shown; findings below may reference images not displayed]

FINDINGS: There is no edema or consolidation. Heart size and pulmonary
vascularity are normal. No adenopathy. There is a large
paraesophageal hernia. No bone lesions. No pneumothorax.
IMPRESSION: Large paraesophageal hernia. No edema or consolidation. Stable
cardiac silhouette.

## 2019-01-13 MED ORDER — LORAZEPAM 1 MG PO TABS
0.5000 mg | ORAL_TABLET | Freq: Once | ORAL | Status: AC
  Administered 2019-01-13: 0.5 mg via ORAL
  Filled 2019-01-13: qty 1

## 2019-01-13 MED ORDER — SODIUM CHLORIDE 0.9 % IV SOLN: Freq: Once | INTRAVENOUS | Status: DC

## 2019-01-13 MED ORDER — METOPROLOL TARTRATE 5 MG/5ML IV SOLN
5.0000 mg | Freq: Once | INTRAVENOUS | Status: AC
  Administered 2019-01-13: 5 mg via INTRAVENOUS
  Filled 2019-01-13: qty 5

## 2019-01-13 MED ORDER — SODIUM CHLORIDE 0.9 % IV BOLUS
1000.0000 mL | Freq: Once | INTRAVENOUS | Status: AC
  Administered 2019-01-13: 1000 mL via INTRAVENOUS

## 2019-01-13 NOTE — ED Triage Notes (Signed)
Pt reports chronic SOB but worse today, pt anxious in triage, HR 160 a fib rvr.. Denies CP

## 2019-01-13 NOTE — ED Provider Notes (Signed)
[facility] EMERGENCY DEPARTMENT Provider Note   CSN: [id number] Arrival date & time: 01/13/19  1517    History   Chief Complaint Chief Complaint  Patient presents with  . Shortness of Breath    HPI Charlotte Castro is a 85 y.o. female.  Level V caveat due to poor historian.    Patient presenting today with worsening shortness of breath and chest pain/tightness.  Patient states she has had shortness of breath for years, but it feels worse today.  She states she feels like she was going to pass out earlier today.  She cannot state exactly when this began.  She denies fevers, cough, nausea, vomiting, abdominal pain.  Patient states she has been taking her medications as prescribed including her Coumadin, had her medication today. She denies contact with known covid-19 positive person. States she has been staying at home.   Additional history obtained from daughter.  Daughter states patient has episodes of anxiety that presents like she is today, with worsening shortness of breath and tachycardia.  Per daughter, patient has been told that she needs to increase her fluid intake, but patient refuses to do so.  Additional history obtained from chart review.  Patient noted to be anxious at every single appointment noted in the EMR.  Patient also concerned that she has things to many doctors, as such does not want to follow with a PCP.     HPI  Past Medical History:  Diagnosis Date  . Anemia    history of  . Anxiety   . Arthritis    "left knee" (04/05/2017)  . Atrial fibrillation ([place])   . Atrial flutter ([place])    typical appearing  . Atrial tachycardia ([place])    ablated 11/17/10  by JA  from the [facility] of the aorta  . Chest pain on exertion   . CHF (congestive heart failure) ([place])   . Chronic lower back pain   . Chronic nausea   . Dizziness    chronic and of an unclear etiology  . Dyspnea   . Endometrial cancer (HCC)    grade 1  . Gallstone pancreatitis   .  Gastritis   . GERD (gastroesophageal reflux disease)   . History of blood transfusion ~ 1948  . History of hiatal hernia   . HTN (hypertension)   . Hyperlipemia   . Hypothyroidism   . Left leg numbness   . Obesity   . Osteoporosis   . PMB (postmenopausal bleeding)   . Sinus headache   . Toe ulcer ([place])    left 3rd toe  . Vitamin D deficiency     Patient Active Problem List   Diagnosis Date Noted  . B12 deficiency 08/08/2018  . Hypothyroidism 08/08/2018  . Endometrial cancer ([place]) 08/05/2017  . Toe ulcer, left, with unspecified severity ([place]) 08/04/2017  . 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 ([place]) 08/04/2017  . Postmenopausal bleeding 08/01/2017  . Acute delirium 04/14/2016  . Tremor 04/14/2016  . Chronic diastolic CHF (congestive heart failure) ([place]) 04/10/2016  . Essential hypertension, benign 01/18/2014  . Long term current use of anticoagulant therapy 01/18/2014  . Permanent atrial fibrillation 06/10/2011    Past Surgical History:  Procedure Laterality Date  . ATRIAL ABLATION SURGERY  11/17/10   Atrial tachycardia arising from [facility] of the aorta ablated by JA  . BACK SURGERY    . CARDIAC CATHETERIZATION  01/11/2011   Archie Endo 01/12/2011  . CHOLECYSTECTOMY  N/A 04/08/2017   Procedure: LAPAROSCOPIC CHOLECYSTECTOMY;  Surgeon: Georganna Skeans, MD;  Location: [facility];  Service: General;  Laterality: N/A;  . FRACTURE SURGERY    . HYSTEROSCOPY W/D&C N/A 08/05/2017   Procedure: DILATATION AND CURETTAGE /HYSTEROSCOPY AND POLYPECTOMY;  Surgeon: Osborne Oman, MD;  Location: [facility];  Service: Gynecology;  Laterality: N/A;  . LUMBAR SPINE SURGERY  ~ 1998  . ROBOTIC ASSISTED TOTAL HYSTERECTOMY WITH BILATERAL SALPINGO OOPHERECTOMY Bilateral 09/20/2017   Procedure: XI ROBOTIC ASSISTED TOTAL HYSTERECTOMY WITH BILATERAL SALPINGO OOPHORECTOMY, SENTINAL LYMPH NODE BIOPSY;  Surgeon: Everitt Amber, MD;  Location: [facility];  Service: Gynecology;  Laterality: Bilateral;   . SHOULDER SURGERY Right 1984 X2   Rollene Rotunda 01/19/2011  . WRIST FRACTURE SURGERY Left 1983     OB History    Gravida  3   Para      Term      Preterm      AB  1   Living        SAB  1   TAB      Ectopic      Multiple      Live Births               Home Medications    Prior to Admission medications   Medication Sig Start Date End Date Taking? Authorizing Provider  acetaminophen (TYLENOL) 650 MG CR tablet Take 650 mg by mouth every morning.   Yes [provider]  Cyanocobalamin (VITAMIN B-12 PO) Take 0.5 tablets by mouth 2 (two) times daily with a meal.   Yes [provider]  levothyroxine (SYNTHROID, LEVOTHROID) 88 MCG tablet Take 1 tablet (88 mcg total) by mouth daily before breakfast. 08/08/18  Yes Isaac Bliss, Rayford Halsted, MD  metoprolol tartrate (LOPRESSOR) 50 MG tablet TAKE ONE TABLET TWICE DAILY WITH A MEAL Patient taking differently: Take 50 mg by mouth 2 (two) times daily with a meal.  10/04/18  Yes Jettie Booze, MD  warfarin (COUMADIN) 5 MG tablet TAKE 1/2 TO ONE TABLET DAILY AS DIRECTEDBY COUMADIN CLINIC Patient taking differently: Take 2.5-5 mg by mouth See admin instructions. Take 2.5 mg by mouth in the evening on Sunday and 5 mg on Mon/Tues/Wed/Thurs/Fri/Sat 10/04/18  Yes Jettie Booze, MD  cyanocobalamin (,VITAMIN B-12,) 1000 MCG/ML injection Inject 1 ml once a week for 1 month. Then 1 ml once a month thereafter. Patient not taking: Reported on 01/13/2019 08/15/18   Isaac Bliss, Rayford Halsted, MD  SYRINGE-NEEDLE, DISP, 3 ML 25G X 1-1/2" 3 ML MISC Use as needed for B12 injections 08/15/18   Isaac Bliss, Rayford Halsted, MD    Family History Family History  Problem Relation Age of Onset  . Heart attack Father   . Hypertension Father     Social History Social History   Tobacco Use  . Smoking status: Never Smoker  . Smokeless tobacco: Never Used  Substance Use Topics  . Alcohol use: No  . Drug use: No     Allergies    Iodinated diagnostic agents and Penicillins   Review of Systems Review of Systems  Respiratory: Positive for chest tightness and shortness of breath.   Psychiatric/Behavioral: The patient is nervous/anxious.   All other systems reviewed and are negative.    Physical Exam Updated Vital Signs BP (!) 163/84   Pulse 78   Resp 19   SpO2 98%   Physical Exam Vitals signs and nursing note reviewed.  Constitutional:      General:  She is not in acute distress.    Appearance: She is well-developed.     Comments: Elderly female who appears anxious.  HENT:     Head: Normocephalic and atraumatic.  Eyes:     Extraocular Movements: Extraocular movements intact.     Conjunctiva/sclera: Conjunctivae normal.     Pupils: Pupils are equal, round, and reactive to light.  Neck:     Musculoskeletal: Normal range of motion and neck supple.  Cardiovascular:     Rate and Rhythm: Tachycardia present. Rhythm irregularly irregular.     Comments: Irregularly irregular around 140 Pulmonary:     Effort: Pulmonary effort is normal. No respiratory distress.     Breath sounds: Normal breath sounds. No wheezing.     Comments: Speaking in full sentences.  Clear lung sounds in all fields.  Tachypneic when she becomes more anxious, but able to be calmed. Abdominal:     General: There is no distension.     Palpations: Abdomen is soft. There is no mass.     Tenderness: There is no abdominal tenderness. There is no guarding or rebound.  Musculoskeletal: Normal range of motion.     Comments: No leg pain or swelling  Skin:    General: Skin is warm and dry.     Capillary Refill: Capillary refill takes less than 2 seconds.  Neurological:     Mental Status: She is alert and oriented to person, place, and time.  Psychiatric:        Mood and Affect: Mood is anxious.      ED Treatments / Results  Labs (all labs ordered are listed, but only abnormal results are displayed) Labs Reviewed  CBC WITH  DIFFERENTIAL/PLATELET - Abnormal; Notable for the following components:      Result Value   Hemoglobin 16.0 (*)    All other components within normal limits  COMPREHENSIVE METABOLIC PANEL - Abnormal; Notable for the following components:   CO2 19 (*)    Glucose, Bld 122 (*)    Creatinine, Ser 1.43 (*)    GFR calc non Af Amer 34 (*)    GFR calc Af Amer 39 (*)    All other components within normal limits  PROTIME-INR - Abnormal; Notable for the following components:   Prothrombin Time 28.1 (*)    INR 2.7 (*)    All other components within normal limits  TSH - Abnormal; Notable for the following components:   TSH 5.280 (*)    All other components within normal limits  URINALYSIS, ROUTINE W REFLEX MICROSCOPIC - Abnormal; Notable for the following components:   APPearance HAZY (*)    Hgb urine dipstick SMALL (*)    Ketones, ur 5 (*)    Protein, ur 30 (*)    Leukocytes,Ua MODERATE (*)    Bacteria, UA RARE (*)    All other components within normal limits  TROPONIN I (HIGH SENSITIVITY) - Abnormal; Notable for the following components:   Troponin I (High Sensitivity) 19 (*)    All other components within normal limits  TROPONIN I (HIGH SENSITIVITY) - Abnormal; Notable for the following components:   Troponin I (High Sensitivity) 19 (*)    All other components within normal limits  SARS CORONAVIRUS 2 (HOSPITAL ORDER, [place] [facility])  URINE CULTURE    EKG EKG Interpretation  Date/Time:  Saturday January 13 2019 15:26:19 EDT Ventricular Rate:  157 PR Interval:    QRS Duration: 82 QT Interval:  300 QTC Calculation:  485 R Axis:   57 Text Interpretation:  Atrial fibrillation with rapid ventricular response Marked ST abnormality, possible inferior subendocardial injury Abnormal ECG Confirmed by Fredia Sorrow [phone]) on 01/13/2019 3:57:18 PM   Radiology Dg Chest 2 View  Result Date: 01/13/2019 CLINICAL DATA:  Shortness of breath and chest pain EXAM: CHEST - 2  VIEW COMPARISON:  June 10, 2017 chest radiograph and chest CT Oct 26, 2017 FINDINGS: There is no edema or consolidation. Heart size and pulmonary vascularity are normal. No adenopathy. There is a large paraesophageal hernia. No bone lesions. No pneumothorax. IMPRESSION: Large paraesophageal hernia. No edema or consolidation. Stable cardiac silhouette. Electronically Signed   By: Lowella Grip III M.D.   On: 01/13/2019 16:42    Procedures Procedures (including critical care time)  Medications Ordered in ED Medications  0.9 %  sodium chloride infusion (has no administration in time range)  metoprolol tartrate (LOPRESSOR) injection 5 mg (5 mg Intravenous Given 01/13/19 1656)  LORazepam (ATIVAN) tablet 0.5 mg (0.5 mg Oral Given 01/13/19 1652)  sodium chloride 0.9 % bolus 1,000 mL (0 mLs Intravenous Stopped 01/13/19 1900)  LORazepam (ATIVAN) tablet 0.5 mg (0.5 mg Oral Given 01/13/19 1723)     Initial Impression / Assessment and Plan / ED Course  I have reviewed the triage vital signs and the nursing notes.  Pertinent labs & imaging results that were available during my care of the patient were reviewed by me and considered in my medical decision making (see chart for details).        Patient presenting for evaluation of chest tightness and shortness of breath.  Physical exam shows patient who appears very anxious.  She is tachycardic and tachypneic, worsens when she appears more anxious and improved when she is calmed.  Patient with a history of chronic A. fib, is on Coumadin.  As such, lower suspicion for PE.  As she is having new chest pain today, will obtain cardiac work-up.  Per daughter, patient with a history of dehydration, will check kidney function and urine.  Metoprolol for heart rate and Ativan for anxiety.  EKG shows A. fib with RVR.  Chest x-ray viewed interpreted by me, no pneumonia, possible effusion, cardiomegaly.  Labs with AKI, creatinine doubled from year before and GFR  currently at 34, significant worsening from greater than 60 1-year ago.  Urine pending.  PT/INR within therapeutic range.  No leukocytosis.  Patient is hemoconcentrated, likely indicating dehydration.  Troponin elevated at 19, this is likely due to demand ischemia.  Will obtain delta troponin.  Patient will likely need to be admitted for her AKI, tachycardia, and elevated troponin.  Heart rate improved with fluids, metoprolol, and Ativan.  Patient is currently chest pain-free.  Repeat troponin stayed the same at 19.  COVID negative.  Urine with moderate leuks, but with 6-10 squamous.  As such, obtain urine culture as I doubt this is a clean sample.  Of note, TSH is slightly elevated at 5.2.  Will call for admission.  Discussed with Dr. Alma Friendly from [facility]ad hospitalist service, patient to be admitted for AKI and elevated Trope.  Cha2ds2vasc: 4. On anticoagulation.     Final Clinical Impressions(s) / ED Diagnoses   Final diagnoses:  AKI (acute kidney injury) ([place])  Chest pain, unspecified type  Atrial fibrillation with RVR [facility])  Elevated troponin    ED Discharge Orders    None       Franchot Heidelberg, PA-C 01/13/19 2344    Fredia Sorrow, MD 02/10/19  1149  

## 2019-01-13 NOTE — ED Notes (Signed)
[phone] pt daughter Tammie; updated.

## 2019-01-14 ENCOUNTER — Observation Stay (HOSPITAL_COMMUNITY): Payer: Medicare Other

## 2019-01-14 DIAGNOSIS — N179 Acute kidney failure, unspecified: Principal | ICD-10-CM

## 2019-01-14 DIAGNOSIS — I4821 Permanent atrial fibrillation: Secondary | ICD-10-CM | POA: Diagnosis not present

## 2019-01-14 DIAGNOSIS — E039 Hypothyroidism, unspecified: Secondary | ICD-10-CM | POA: Diagnosis not present

## 2019-01-14 DIAGNOSIS — I10 Essential (primary) hypertension: Secondary | ICD-10-CM

## 2019-01-14 LAB — BASIC METABOLIC PANEL
Anion gap: 8 (ref 5–15)
BUN: 16 mg/dL (ref 8–23)
CO2: 22 mmol/L (ref 22–32)
Calcium: 9.5 mg/dL (ref 8.9–10.3)
Chloride: 111 mmol/L (ref 98–111)
Creatinine, Ser: 1.16 mg/dL — ABNORMAL HIGH (ref 0.44–1.00)
GFR calc Af Amer: 50 mL/min — ABNORMAL LOW (ref >60)
GFR calc non Af Amer: 43 mL/min — ABNORMAL LOW (ref >60)
Glucose, Bld: 75 mg/dL (ref 70–99)
Potassium: 4 mmol/L (ref 3.5–5.1)
Sodium: 141 mmol/L (ref 135–145)

## 2019-01-14 LAB — PROTIME-INR
INR: 2.9 — ABNORMAL HIGH (ref 0.8–1.2)
Prothrombin Time: 29.6 seconds — ABNORMAL HIGH (ref 11.4–15.2)

## 2019-01-14 IMAGING — DX ABDOMEN - 1 VIEW
1 series · 1 of 1 positions shown · non-contrast
Comparison: None

CLINICAL DATA: Nausea and vomiting

EXAM:
ABDOMEN - 1 VIEW

[abdomen]
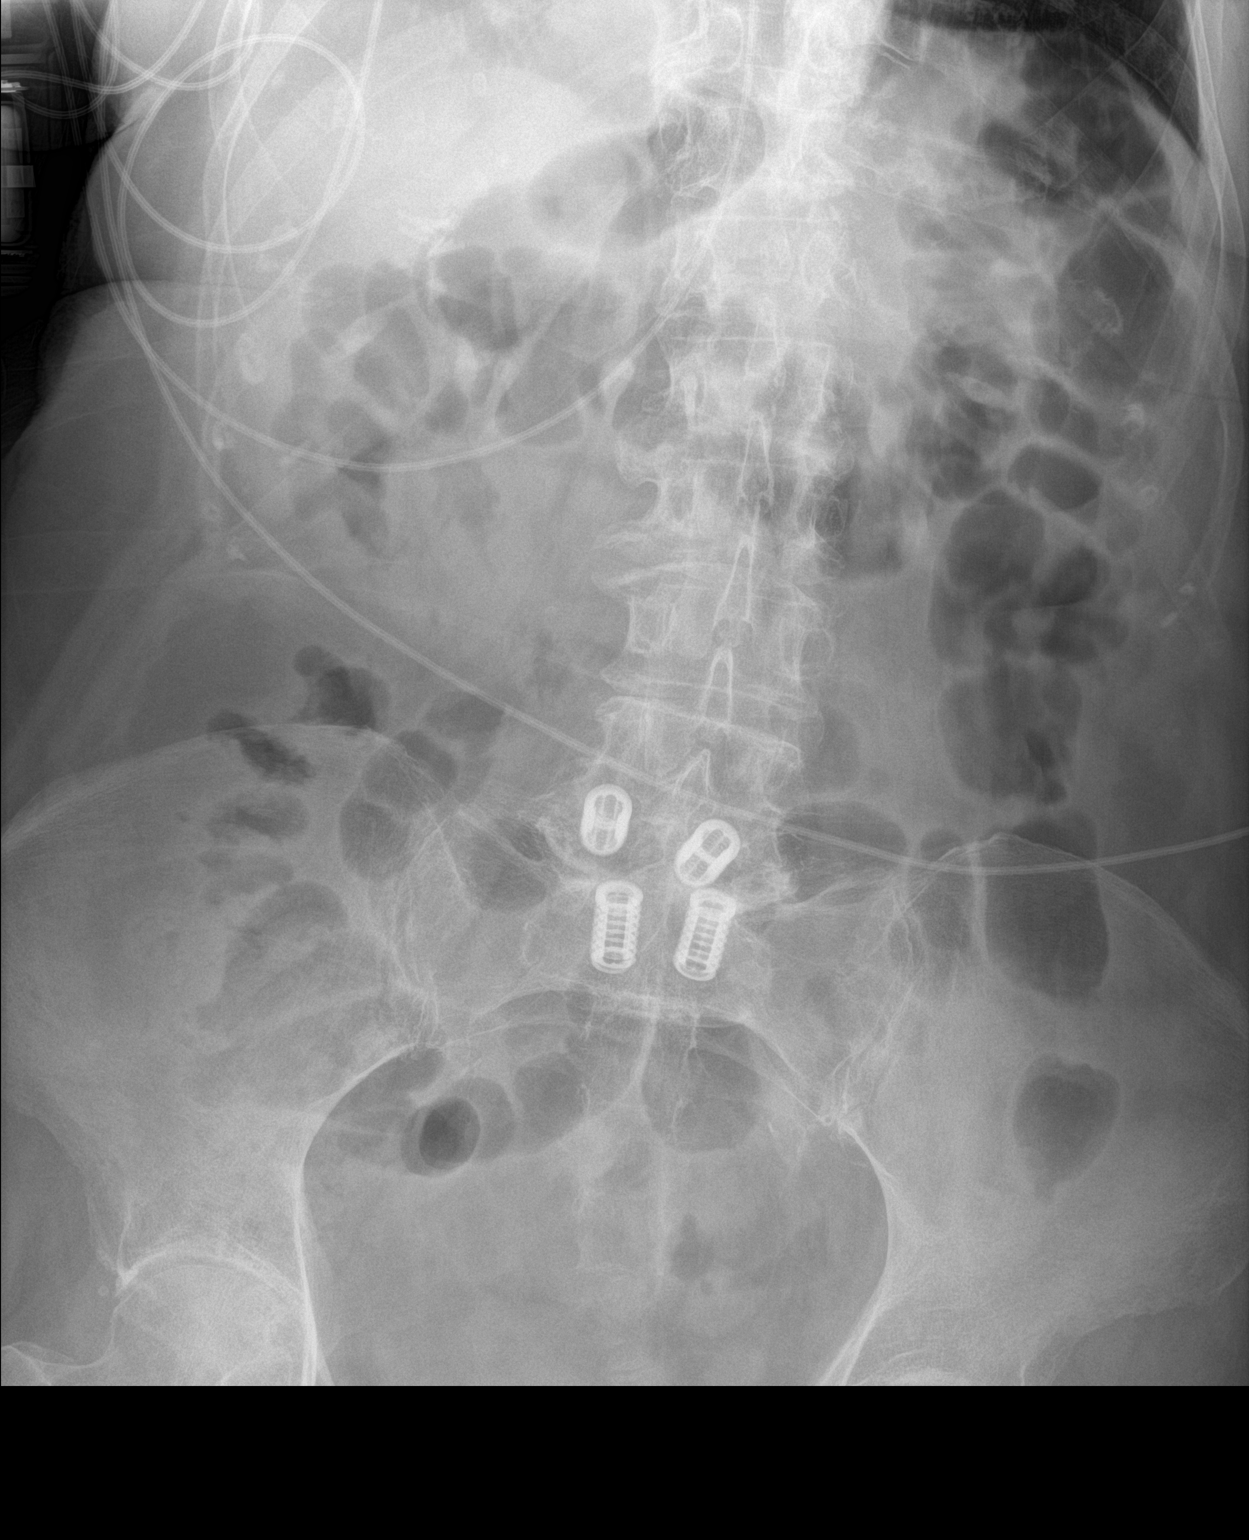

[1 of 1 positions shown; findings below may reference images not displayed]

FINDINGS: No dilated loops of large or small bowel. No pathologic
calcifications. No organomegaly. No aggressive osseous lesion. No
intraperitoneal free air. Lower lumbar fusion. Degenerative
narrowing of the hip joints.
IMPRESSION: No bowel obstruction.

## 2019-01-14 MED ORDER — METOPROLOL TARTRATE 50 MG PO TABS
50.0000 mg | ORAL_TABLET | Freq: Two times a day (BID) | ORAL | Status: DC
  Administered 2019-01-14 – 2019-01-19 (×11): 50 mg via ORAL
  Filled 2019-01-14 (×11): qty 1

## 2019-01-14 MED ORDER — DEXTROSE-NACL 5-0.45 % IV SOLN
INTRAVENOUS | Status: DC
  Administered 2019-01-14 (×2): via INTRAVENOUS

## 2019-01-14 MED ORDER — ONDANSETRON HCL 4 MG/2ML IJ SOLN
4.0000 mg | Freq: Four times a day (QID) | INTRAMUSCULAR | Status: DC | PRN
  Administered 2019-01-14 – 2019-01-17 (×3): 4 mg via INTRAVENOUS
  Filled 2019-01-14 (×2): qty 2

## 2019-01-14 MED ORDER — WARFARIN - PHYSICIAN DOSING INPATIENT: Freq: Every day | Status: DC

## 2019-01-14 MED ORDER — WARFARIN SODIUM 2.5 MG PO TABS: 2.5000 mg | ORAL_TABLET | ORAL | Status: DC

## 2019-01-14 MED ORDER — LACTATED RINGERS IV SOLN
INTRAVENOUS | Status: DC
  Administered 2019-01-14 – 2019-01-18 (×5): via INTRAVENOUS

## 2019-01-14 MED ORDER — WARFARIN - PHARMACIST DOSING INPATIENT: Freq: Every day | Status: DC

## 2019-01-14 MED ORDER — WARFARIN SODIUM 2.5 MG PO TABS
2.5000 mg | ORAL_TABLET | Freq: Once | ORAL | Status: AC
  Administered 2019-01-14: 2.5 mg via ORAL
  Filled 2019-01-14: qty 1

## 2019-01-14 MED ORDER — VITAMIN B-12 1000 MCG PO TABS
1000.0000 ug | ORAL_TABLET | Freq: Every day | ORAL | Status: DC
  Administered 2019-01-14 – 2019-01-19 (×6): 1000 ug via ORAL
  Filled 2019-01-14 (×6): qty 1

## 2019-01-14 MED ORDER — LIDOCAINE VISCOUS HCL 2 % MT SOLN
15.0000 mL | Freq: Once | OROMUCOSAL | Status: AC
  Administered 2019-01-14: 15 mL via ORAL
  Filled 2019-01-14: qty 15

## 2019-01-14 MED ORDER — FAMOTIDINE IN NACL 20-0.9 MG/50ML-% IV SOLN
20.0000 mg | Freq: Every day | INTRAVENOUS | Status: DC
  Administered 2019-01-14 – 2019-01-17 (×4): 20 mg via INTRAVENOUS
  Filled 2019-01-14 (×4): qty 50

## 2019-01-14 MED ORDER — ACETAMINOPHEN 650 MG RE SUPP: 650.0000 mg | Freq: Four times a day (QID) | RECTAL | Status: DC | PRN

## 2019-01-14 MED ORDER — ACETAMINOPHEN 325 MG PO TABS
650.0000 mg | ORAL_TABLET | Freq: Four times a day (QID) | ORAL | Status: DC | PRN
  Administered 2019-01-14 – 2019-01-16 (×4): 650 mg via ORAL
  Filled 2019-01-14 (×5): qty 2

## 2019-01-14 MED ORDER — WARFARIN SODIUM 5 MG PO TABS: 5.0000 mg | ORAL_TABLET | ORAL | Status: DC

## 2019-01-14 MED ORDER — DOCUSATE SODIUM 100 MG PO CAPS
100.0000 mg | ORAL_CAPSULE | Freq: Two times a day (BID) | ORAL | Status: DC
  Administered 2019-01-14 – 2019-01-19 (×10): 100 mg via ORAL
  Filled 2019-01-14 (×11): qty 1

## 2019-01-14 MED ORDER — LEVOTHYROXINE SODIUM 88 MCG PO TABS
88.0000 ug | ORAL_TABLET | Freq: Every day | ORAL | Status: DC
  Administered 2019-01-14 – 2019-01-19 (×5): 88 ug via ORAL
  Filled 2019-01-14 (×6): qty 1

## 2019-01-14 MED ORDER — ALUM & MAG HYDROXIDE-SIMETH 200-200-20 MG/5ML PO SUSP
30.0000 mL | Freq: Once | ORAL | Status: AC
  Administered 2019-01-14: 30 mL via ORAL
  Filled 2019-01-14: qty 30

## 2019-01-14 MED ORDER — HYDROMORPHONE HCL 1 MG/ML IJ SOLN
0.5000 mg | INTRAMUSCULAR | Status: DC | PRN
  Administered 2019-01-14 – 2019-01-16 (×3): 0.5 mg via INTRAVENOUS
  Filled 2019-01-14 (×3): qty 0.5

## 2019-01-14 NOTE — Plan of Care (Signed)
  Problem: Education: Goal: Knowledge of General Education information will improve Description: Including pain rating scale, medication(s)/side effects and non-pharmacologic comfort measures Outcome: Progressing   Problem: Safety: Goal: Ability to remain free from injury will improve Outcome: Progressing   Problem: Skin Integrity: Goal: Risk for impaired skin integrity will decrease Outcome: Progressing   

## 2019-01-14 NOTE — Progress Notes (Signed)
Rehab in to work with patient got her oob to chair she c/o headache I have her prn tylenol,she also c/o dizziness with position changes. [place] after getting up she became naseated and vomited small amount on her gown. Daughter has called this am reported patient does not eat and drink enough she eats like a bird reported felt like her nausea was due to dehydration writer reported would keep that in mind. She reported to call her brother if they release her mom today. No further changes noted.

## 2019-01-14 NOTE — Plan of Care (Signed)

## 2019-01-14 NOTE — H&P (Signed)
History and Physical    Charlotte Castro ZOX:[id number] DOB: 1932/10/01 DOA: 01/13/2019  PCP: Isaac Bliss, Rayford Halsted, MD (Confirm with patient/family/NH records and if not entered, this has to be entered at [facility] point of entry) Patient coming from: Patient is coming from home  I have personally briefly reviewed patient's old medical records in [place]  Chief Complaint: Increasing weakness shortness of breath and tachycardia  HPI: Charlotte Castro is a 85 y.o. female with medical history significant of permanent atrial fibrillation and hypothyroid disease.  Reports she is been generally feeling weak for more than a year.  She is informed all of her doctors that she does not have the energy she thinks she should have.  August 1 the patient complained of increasing weakness along with shortness of breath and being very lightheaded.  Because of these symptoms emergency department.  She denied having any pain or any focal complaints   ED Course: In the emergency department the patient was found to have a tachycardic heart rate of 160 was in atrial fibrillation with rapid ventricular response.  His lab work revealed the patient's creatinine was elevated to 1.43 from a baseline of 0.8.  GFR dropped from 60-34.  Patient was found to have a mildly elevated troponin which was thought to be secondary to demand ischemia.  Patient was bolused with 1 L of normal saline and given a dose of IV metoprolol.  Her blood pressure which was initially 86/74 proved significantly after her IV fluid bolus.  [facility] hospitalist was called to admit the patient for revision and continued IV hydration  Review of Systems: As per HPI otherwise 10 point review of systems negative.    Past Medical History:  Diagnosis Date  . Anemia    history of  . Anxiety   . Arthritis    "left knee" (04/05/2017)  . Atrial fibrillation ([place])   . Atrial flutter ([place])    typical appearing  . Atrial tachycardia ([place])    ablated 11/17/10   by JA  from the [facility] of the aorta  . Chest pain on exertion   . CHF (congestive heart failure) ([place])   . Chronic lower back pain   . Chronic nausea   . Dizziness    chronic and of an unclear etiology  . Dyspnea   . Endometrial cancer (HCC)    grade 1  . Gallstone pancreatitis   . Gastritis   . GERD (gastroesophageal reflux disease)   . History of blood transfusion ~ 1948  . History of hiatal hernia   . HTN (hypertension)   . Hyperlipemia   . Hypothyroidism   . Left leg numbness   . Obesity   . Osteoporosis   . PMB (postmenopausal bleeding)   . Sinus headache   . Toe ulcer ([place])    left 3rd toe  . Vitamin D deficiency     Past Surgical History:  Procedure Laterality Date  . ATRIAL ABLATION SURGERY  11/17/10   Atrial tachycardia arising from [facility] of the aorta ablated by JA  . BACK SURGERY    . CARDIAC CATHETERIZATION  01/11/2011   Archie Endo 01/12/2011  . CHOLECYSTECTOMY N/A 04/08/2017   Procedure: LAPAROSCOPIC CHOLECYSTECTOMY;  Surgeon: Georganna Skeans, MD;  Location: [facility];  Service: General;  Laterality: N/A;  . FRACTURE SURGERY    . HYSTEROSCOPY W/D&C N/A 08/05/2017   Procedure: DILATATION AND CURETTAGE /HYSTEROSCOPY AND POLYPECTOMY;  Surgeon: Osborne Oman, MD;  Location: [facility];  Service:  Gynecology;  Laterality: N/A;  . LUMBAR SPINE SURGERY  ~ 1998  . ROBOTIC ASSISTED TOTAL HYSTERECTOMY WITH BILATERAL SALPINGO OOPHERECTOMY Bilateral 09/20/2017   Procedure: XI ROBOTIC ASSISTED TOTAL HYSTERECTOMY WITH BILATERAL SALPINGO OOPHORECTOMY, SENTINAL LYMPH NODE BIOPSY;  Surgeon: Everitt Amber, MD;  Location: [facility];  Service: Gynecology;  Laterality: Bilateral;  . SHOULDER SURGERY Right 1984 X2   Rollene Rotunda 01/19/2011  . WRIST FRACTURE SURGERY Left 1983     reports that she has never smoked. She has never used smokeless tobacco. She reports that she does not drink alcohol or use drugs.  Allergies  Allergen Reactions  . Iodinated Diagnostic Agents Shortness Of Breath    Causes  headaches, also  . Penicillins Other (See Comments)    Tolerated Zosyn Oct 2018- Patient is unaware this is an "allergy" Did it involve swelling of the face/tongue/throat, SOB, or low BP? Unk Did it involve sudden or severe rash/hives, skin peeling, or any reaction on the inside of your mouth or nose? Unk Did you need to seek medical attention at a hospital or doctor's office? Unk When did it last happen? Unk If all above answers are "NO", may proceed with cephalosporin use.     Family History  Problem Relation Age of Onset  . Heart attack Father   . Hypertension Father    Social history -patient was married for approximately 18 years and divorced.  Has 1 son and 1 daughter and no grandchildren.  Daughter lives in [place] her son is in [place].  Patient lives alone but does have several pets.  He has not made plans for her future in regards to her regular living nor is she considered end-of-life decisions.  She is encouraged to discuss her end-of-life wishes with her primary care physician or her cardiologist.  Prior to Admission medications   Medication Sig Start Date End Date Taking? Authorizing Provider  acetaminophen (TYLENOL) 650 MG CR tablet Take 650 mg by mouth every morning.   Yes [provider]  Cyanocobalamin (VITAMIN B-12 PO) Take 0.5 tablets by mouth 2 (two) times daily with a meal.   Yes [provider]  levothyroxine (SYNTHROID, LEVOTHROID) 88 MCG tablet Take 1 tablet (88 mcg total) by mouth daily before breakfast. 08/08/18  Yes Isaac Bliss, Rayford Halsted, MD  metoprolol tartrate (LOPRESSOR) 50 MG tablet TAKE ONE TABLET TWICE DAILY WITH A MEAL Patient taking differently: Take 50 mg by mouth 2 (two) times daily with a meal.  10/04/18  Yes Jettie Booze, MD  warfarin (COUMADIN) 5 MG tablet TAKE 1/2 TO ONE TABLET DAILY AS DIRECTEDBY COUMADIN CLINIC Patient taking differently: Take 2.5-5 mg by mouth See admin instructions. Take 2.5 mg by mouth in the  evening on Sunday and 5 mg on Mon/Tues/Wed/Thurs/Fri/Sat 10/04/18  Yes Jettie Booze, MD  cyanocobalamin (,VITAMIN B-12,) 1000 MCG/ML injection Inject 1 ml once a week for 1 month. Then 1 ml once a month thereafter. Patient not taking: Reported on 01/13/2019 08/15/18   Isaac Bliss, Rayford Halsted, MD  SYRINGE-NEEDLE, DISP, 3 ML 25G X 1-1/2" 3 ML MISC Use as needed for B12 injections 08/15/18   Isaac Bliss, Rayford Halsted, MD    Physical Exam: Vitals:   01/13/19 2130 01/13/19 2145 01/13/19 2200 01/14/19 0040  BP: (!) 86/74 117/79 (!) 163/84 (!) 146/100  Pulse: 76 79 78 99  Resp: 13 14 19  (!) 24  SpO2: 99% 98% 98% 96%    Constitutional: NAD, calm, comfortable Vitals:   01/13/19 2130 01/13/19 2145  01/13/19 2200 01/14/19 0040  BP: (!) 86/74 117/79 (!) 163/84 (!) 146/100  Pulse: 76 79 78 99  Resp: 13 14 19  (!) 24  SpO2: 99% 98% 98% 96%   :General appearance a large framed woman somewhat overweight is in no distress \ eyes: PERRL, lids and conjunctivae normal ENMT: Mucous membranes are moist. Posterior pharynx clear of any exudate or lesions.edentulous on the maxilla, apparent denture on the mandible Neck: normal, supple, no masses, no thyromegaly Respiratory: clear to auscultation bilaterally, no wheezing, no crackles. Normal respiratory effort. No accessory muscle use.  Cardiovascular: Irregularly irregular rate controlled rate, no murmurs / rubs / gallops. No extremity edema. 2+ pedal pulses. No carotid bruits.  Abdomen: no tenderness, no masses palpated. No hepatosplenomegaly. Bowel sounds positive.  Musculoskeletal: no clubbing / cyanosis. No joint deformity upper and lower extremities. Good ROM, no contractures. Normal muscle tone.  Skin: no rashes, lesions, ulcers. No induration.  Large seborrheic keratosis mid back  neurologic: CN 2-12 grossly intact. Sensation intact, . Strength 5/5 in all 4.  Psychiatric: Normal judgment and insight. Alert and oriented x 3. Normal mood.      Labs on Admission: I have personally reviewed following labs and imaging studies  CBC: Recent Labs  Lab 01/13/19 1615  WBC 8.2  NEUTROABS 5.1  HGB 16.0*  HCT 45.0  MCV 91.6  PLT 888   Basic Metabolic Panel: Recent Labs  Lab 01/13/19 1615  NA 139  K 4.0  CL 107  CO2 19*  GLUCOSE 122*  BUN 17  CREATININE 1.43*  CALCIUM 10.2   GFR: CrCl cannot be calculated (Unknown ideal weight.). Liver Function Tests: Recent Labs  Lab 01/13/19 1615  AST 25  ALT 16  ALKPHOS 68  BILITOT 1.1  PROT 7.0  ALBUMIN 4.0   No results for input(s): LIPASE, AMYLASE in the last 168 hours. No results for input(s): AMMONIA in the last 168 hours. Coagulation Profile: Recent Labs  Lab 01/13/19 1615  INR 2.7*   Cardiac Enzymes: No results for input(s): CKTOTAL, CKMB, CKMBINDEX, TROPONINI in the last 168 hours. BNP (last 3 results) No results for input(s): PROBNP in the last 8760 hours. HbA1C: No results for input(s): HGBA1C in the last 72 hours. CBG: No results for input(s): GLUCAP in the last 168 hours. Lipid Profile: No results for input(s): CHOL, HDL, LDLCALC, TRIG, CHOLHDL, LDLDIRECT in the last 72 hours. Thyroid Function Tests: Recent Labs    01/13/19 1655  TSH 5.280*   Anemia Panel: No results for input(s): VITAMINB12, FOLATE, FERRITIN, TIBC, IRON, RETICCTPCT in the last 72 hours. Urine analysis:    Component Value Date/Time   COLORURINE YELLOW 01/13/2019 2042   APPEARANCEUR HAZY (A) 01/13/2019 2042   LABSPEC 1.020 01/13/2019 2042   PHURINE 5.0 01/13/2019 2042   GLUCOSEU NEGATIVE 01/13/2019 2042   HGBUR SMALL (A) 01/13/2019 2042   BILIRUBINUR NEGATIVE 01/13/2019 2042   KETONESUR 5 (A) 01/13/2019 2042   PROTEINUR 30 (A) 01/13/2019 2042   UROBILINOGEN 2.0 (H) 11/18/2010 0019   NITRITE NEGATIVE 01/13/2019 2042   LEUKOCYTESUR MODERATE (A) 01/13/2019 2042    Radiological Exams on Admission: Dg Chest 2 View  Result Date: 01/13/2019 CLINICAL DATA:  Shortness of breath  and chest pain EXAM: CHEST - 2 VIEW COMPARISON:  June 10, 2017 chest radiograph and chest CT Oct 26, 2017 FINDINGS: There is no edema or consolidation. Heart size and pulmonary vascularity are normal. No adenopathy. There is a large paraesophageal hernia. No bone lesions. No pneumothorax. IMPRESSION:  Large paraesophageal hernia. No edema or consolidation. Stable cardiac silhouette. Electronically Signed   By: Lowella Grip III M.D.   On: 01/13/2019 16:42    EKG: Independently reviewed.  Initial EKG revealed atrial fibrillation with rapid ventricular response with ST depression in the anterior leads consistent with demand ischemia  Assessment/Plan Active Problems:   AKI (acute kidney injury) ([place])   Permanent atrial fibrillation   Essential hypertension, benign   Long term current use of anticoagulant therapy   Chronic diastolic CHF (congestive heart failure) ([place])   Hypothyroidism  (please populate well all problems here in Problem List. (For example, if patient is on BP meds at home and you resume or decide to hold them, it is a problem that needs to be her. Same for CAD, COPD, HLD and so on)   1.  AKI -patient with prerenal azotemia with elevated creatinine at 1.43, up from her baseline of 0.8.  Suspect this is from inadequate fluid intake.  Patient has received a 1 L bolus of normal saline.  Her blood pressure is improved. Plan gentle hydration with D5 half-normal saline at 50 cc an hour for 24 hours  Careful observation, measurement of I's and O's, and daily weights given her history of CHF  2.  Atrial fibrillation with RVR -patient was given an IV dose of metoprolol in the ED with a marked improvement in her heart rate to the mid 80s.  Patient is less symptomatic. Plan laboratory observation  Continue her home medication -p.o. metoprolol  Continue her warfarin  3.  Hypertension -patient was initially hypotensive in the emergency department with a rise in blood pressure after  receiving a bolus of fluids. Plan to your home regimen  4.  Hypothyroidism -TSH is 5.2.  We will continue her home medication.  5.  Pyuria -patient with a 1-50 WBCs per high-powered field on urinalysis.  Patient is afebrile.  ED nurse practitioner ported that this was not a clean-catch. Plan repeat urinalysis  6.  Generalized weakness poor balance  -a longstanding problem  Plan PT evaluation    DVT prophylaxis: Warfarin (Lovenox/Heparin/SCD's/anticoagulated/None (if comfort care) Code Status: Full code (Full/Partial (specify details) Family Communication: Patient requested I not disturb her daughter at the hour of exam (Specify name, relationship. Do not write "discussed with patient". Specify tel # if discussed over the phone) Disposition Plan: Home if cleared by PT in 24 to 48 hours (specify when and where you expect patient to be discharged) Consults called: None (with names) Admission status: Telemetry observation (inpatient / obs / tele / medical floor / SDU)   Adella Hare MD [facility] Pager [phone]  If 7PM-7AM, please contact night-coverage [email or web address] Password [email or web address]  01/14/2019, 1:03 AM

## 2019-01-14 NOTE — Progress Notes (Signed)
Patient given zofran prn n/v conts have n/v and now is c/o abd pain MD paged for further orders.

## 2019-01-14 NOTE — Progress Notes (Signed)
Per HPI: Charlotte Castro is a 85 y.o. female with medical history significant of permanent atrial fibrillation and hypothyroid disease.  Reports she is been generally feeling weak for more than a year.  She is informed all of her doctors that she does not have the energy she thinks she should have.  August 1 the patient complained of increasing weakness along with shortness of breath and being very lightheaded.  Because of these symptoms emergency department.  She denied having any pain or any focal complaints.  She was evaluated at bedside earlier this morning with some mild nausea, but no other significant symptomatology noted.  Patient was admitted with some AKI, likely prerenal, from poor oral intake along with atrial fibrillation with RVR with marked improvement in the ED after 1 dose of IV metoprolol.  She is on warfarin for anticoagulation.  She is noted to have a periesophageal hiatal hernia on her chest x-ray.  Unfortunately, her symptoms began to worsen after she had lunch.  She began to have significant nausea and vomiting as well as abdominal pain for which Zofran and Dilaudid were given.  Rapid response was called on account of the fact that her blood pressures began to elevate significantly and she was becoming hypoxemic requiring 4 L nasal cannula.  I had called Dr. Sloan Leiter who had evaluated patient at bedside and noted a benign abdominal examination with improving blood pressures by the time of his evaluation.  It appears, that patient may have gagged on some of her food causing the symptoms.  She is overall now feeling better and will be given Protonix as well as GI cocktail, which should help with her symptoms  I had ordered KUB earlier which had resulted with no signs of bowel obstruction at this time.  Will need close monitoring through the evening and will maintain on IV fluid with repeat labs in a.m.  PT evaluation also pending for generalized weakness and poor balance.  Repeat urine  analysis with signs of pyuria, but no UTI identified.  Total critical care time: 60 minutes.

## 2019-01-14 NOTE — Progress Notes (Signed)
ANTICOAGULATION CONSULT NOTE - Initial Consult  Pharmacy Consult for warfarin Indication: atrial fibrillation  Allergies  Allergen Reactions  . Iodinated Diagnostic Agents Shortness Of Breath    Causes headaches, also  . Penicillins Other (See Comments)    Tolerated Zosyn Oct 2018- Patient is unaware this is an "allergy" Did it involve swelling of the face/tongue/throat, SOB, or low BP? Unk Did it involve sudden or severe rash/hives, skin peeling, or any reaction on the inside of your mouth or nose? Unk Did you need to seek medical attention at a hospital or doctor's office? Unk When did it last happen? Unk If all above answers are "NO", may proceed with cephalosporin use.     Patient Measurements: Height: 5\' 8"  (172.7 cm) Weight: 201 lb 11.2 oz (91.5 kg) IBW/kg (Calculated) : 63.9   Vital Signs: BP: 127/94 (08/02 1422) Pulse Rate: 69 (08/02 1422)  Labs: Recent Labs    01/13/19 0958 01/13/19 1615 01/13/19 1820 01/14/19 0522  HGB  --  16.0*  --   --   HCT  --  45.0  --   --   PLT  --  191  --   --   LABPROT  --  28.1*  --  29.6*  INR  --  2.7*  --  2.9*  CREATININE 1.16* 1.43*  --   --   TROPONINIHS  --  19* 19*  --     Estimated Creatinine Clearance: 34.6 mL/min (A) (by C-G formula based on SCr of 1.43 mg/dL (H)).   Medical History: Past Medical History:  Diagnosis Date  . Anemia    history of  . Anxiety   . Arthritis    "left knee" (04/05/2017)  . Atrial fibrillation ([place])   . Atrial flutter ([place])    typical appearing  . Atrial tachycardia ([place])    ablated 11/17/10  by JA  from the [facility] of the aorta  . Chest pain on exertion   . CHF (congestive heart failure) ([place])   . Chronic lower back pain   . Chronic nausea   . Dizziness    chronic and of an unclear etiology  . Dyspnea   . Endometrial cancer (HCC)    grade 1  . Gallstone pancreatitis   . Gastritis   . GERD (gastroesophageal reflux disease)   . History of blood transfusion ~ 1948  . History  of hiatal hernia   . HTN (hypertension)   . Hyperlipemia   . Hypothyroidism   . Left leg numbness   . Obesity   . Osteoporosis   . PMB (postmenopausal bleeding)   . Sinus headache   . Toe ulcer ([place])    left 3rd toe  . Vitamin D deficiency     Medications:  Medications Prior to Admission  Medication Sig Dispense Refill Last Dose  . acetaminophen (TYLENOL) 650 MG CR tablet Take 650 mg by mouth every morning.   01/13/2019 at am  . Cyanocobalamin (VITAMIN B-12 PO) Take 0.5 tablets by mouth 2 (two) times daily with a meal.   01/13/2019 at am  . levothyroxine (SYNTHROID, LEVOTHROID) 88 MCG tablet Take 1 tablet (88 mcg total) by mouth daily before breakfast. 90 tablet 3 01/13/2019 at am  . metoprolol tartrate (LOPRESSOR) 50 MG tablet TAKE ONE TABLET TWICE DAILY WITH A MEAL (Patient taking differently: Take 50 mg by mouth 2 (two) times daily with a meal. ) 60 tablet 7 01/13/2019 at 1530  . warfarin (COUMADIN) 5 MG tablet TAKE 1/2  TO ONE TABLET DAILY AS DIRECTEDBY COUMADIN CLINIC (Patient taking differently: Take 2.5-5 mg by mouth See admin instructions. Take 2.5 mg by mouth in the evening on Sunday and 5 mg on Mon/Tues/Wed/Thurs/Fri/Sat) 30 tablet 2 01/13/2019 at 1530  . cyanocobalamin (,VITAMIN B-12,) 1000 MCG/ML injection Inject 1 ml once a week for 1 month. Then 1 ml once a month thereafter. (Patient not taking: Reported on 01/13/2019) 30 mL 0 Not Taking at Unknown time  . SYRINGE-NEEDLE, DISP, 3 ML 25G X 1-1/2" 3 ML MISC Use as needed for B12 injections 100 each 3    Scheduled:  . alum & mag hydroxide-simeth  30 mL Oral Once   And  . lidocaine  15 mL Oral Once  . docusate sodium  100 mg Oral BID  . levothyroxine  88 mcg Oral QAC breakfast  . metoprolol tartrate  50 mg Oral BID WC  . vitamin B-12  1,000 mcg Oral Daily  . warfarin  2.5 mg Oral Q Sun-1800   And  . [START ON 01/15/2019] warfarin  5 mg Oral Once per day on Mon Tue Wed Thu Fri Sat  . Warfarin - Physician Dosing Inpatient   Does not  apply q1800    Assessment: 85 yo female with afib/RVR and AKI. She is on coumadin PTA and pharmacy consulted to dose -INR= 2.9  (trend up), hg= 16  Home dose: 2.5 mg on Sunday, 5 mg every other day. Last clinic visit on 6/19 with INR= 2.9  Goal of Therapy:  INR 2-3 Monitor platelets by anticoagulation protocol: Yes   Plan:  -Warfarin 2.5mg  po today -Daily PT/INR  Hildred Laser, PharmD Clinical Pharmacist **Pharmacist phone directory can now be found on [email or web address] (PW [email or web address]).  Listed under [place].

## 2019-01-14 NOTE — Progress Notes (Signed)
Patient with elevated bp,o2 sats down to 71% required I apply 4 liters o2 n/c,Zofran for n/v given and dilaudid 0.5mg  IVP given for c/o abd pain. She has has some ectopy on the tele monitor with this episode also noted HR going down with n/v MD aware of above reported would order abdominal KUB. Got o2 sat up to 96% now,patient reports throwing meat up it got hung she thinks. HR 84 bp 156/102 at present.

## 2019-01-14 NOTE — Progress Notes (Signed)
Call received per floor at 1345 RN regarding Pt with new N/V ABD pain following a choking episode during lunch. Per floor RN Dr. Manuella Ghazi notified, KUB ordered, GI consulted,  Xofran and Dilaudid given. GI cocktail ordered and to be given.At time of call Pt 100% on 4 LNC, HR 70s A Fib, BP 154/113, Pt resting but still having nausea. Upon arrival Pt resting. Alert oriented x4 , admits to pain in ABD improving some, denies nausea. KUB completed. VSS at this time. RN advised to monitor closlely and notify MD of results of KUB. Will follow.

## 2019-01-14 NOTE — Progress Notes (Signed)
Patient eating lunch reports feels like food hanging in her stomach would not go down began having n/v it has food particles in it. Md informed reported would order something for n/v and possibly talk with GI MD regarding her condition has hx of Hernia.

## 2019-01-14 NOTE — ED Notes (Signed)
Admitting at bedside 

## 2019-01-14 NOTE — Evaluation (Signed)
Physical Therapy Evaluation Patient Details Name: Charlotte Castro MRN: [id number] DOB: 1932/08/04 Today's Date: 01/14/2019   History of Present Illness  Pt is an 85 yo female s/p afib with RVR, acute kidney injury and SOB. Pt PMHx: anxiety, arthritis, endometrial CA, HTN, HLD, back sx, shoulder and wrist sx.  Clinical Impression  PTA pt living alone in single story home with ramped entrance. Pt reports use of SPC for ambulation and independence in iADLs, driving to store 2 weeks ago, however since then she has had decreasing strength and mobility. Pt is limited in safe mobility by nausea and dizziness as well as SoB in presence of decreased strength, balance and endurance. Evaluation limited as pt only able to transfer from [facility] to bed due to increased nausea. Based on observed mobility PT recommending SNF level care at d/c due to deconditioning and decreased mobility with limited outside support, however pt reports she will refuse as she was just at [facility] and "they didn't do anything for me." If pt is eligible for [facility] level care, pt maybe able to progress back to independence. PT will continue to follow acutely.     Follow Up Recommendations SNF    Equipment Recommendations  None recommended by PT       Precautions / Restrictions Precautions Precautions: Fall Restrictions Weight Bearing Restrictions: No      Mobility  Bed Mobility Overal bed mobility: Needs Assistance Bed Mobility: Sit to Supine       Sit to supine: Supervision   General bed mobility comments: for safety  Transfers Overall transfer level: Needs assistance Equipment used: Straight cane Transfers: Sit to/from Stand;Stand Pivot Transfers Sit to Stand: Min assist Stand pivot transfers: Min assist       General transfer comment: minA for steadying with power up from [place] as pt reached over to bed holding on for support through entire transfer.   Ambulation/Gait             General Gait  Details: pt refused due to nausea and dizziness       Balance Overall balance assessment: (P) Needs assistance                                           Pertinent Vitals/Pain Pain Assessment: Faces Faces Pain Scale: Hurts little more Pain Location: b/l knees Pain Descriptors / Indicators: Discomfort Pain Intervention(s): Limited activity within patient's tolerance;Monitored during session;Repositioned    Home Living Family/patient expects to be discharged to:: Private residence Living Arrangements: Alone Available Help at Discharge: Family;Available PRN/intermittently Type of Home: House Home Access: Ramped entrance     Home Layout: One level Home Equipment: Cane - single point;Walker - 4 wheels;Bedside commode;Wheelchair - manual      Prior Function Level of Independence: Independent with assistive device(s)               Hand Dominance   Dominant Hand: Right    Extremity/Trunk Assessment   Upper Extremity Assessment Upper Extremity Assessment: Defer to OT evaluation    Lower Extremity Assessment Lower Extremity Assessment: Generalized weakness    Cervical / Trunk Assessment Cervical / Trunk Assessment: Normal  Communication   Communication: No difficulties  Cognition Arousal/Alertness: Awake/alert Behavior During Therapy: WFL for tasks assessed/performed Overall Cognitive Status: Within Functional Limits for tasks assessed  General Comments General comments (skin integrity, edema, etc.): Pt with continued nausea with mobility, as well as 3/4 DoE with pivot transfer to bed, pt on 4L O2          Assessment/Plan    PT Assessment Patient needs continued PT services  PT Problem List Decreased strength;Decreased activity tolerance;Decreased balance;Decreased mobility;Decreased safety awareness;Cardiopulmonary status limiting activity       PT Treatment Interventions DME  instruction;Gait training;Functional mobility training;Therapeutic activities;Therapeutic exercise;Balance training;Cognitive remediation;Patient/family education    PT Goals (Current goals can be found in the Care Plan section)  Acute Rehab PT Goals Patient Stated Goal: get strength back  PT Goal Formulation: With patient Time For Goal Achievement: 01/28/19 Potential to Achieve Goals: Fair    Frequency Min 3X/week   Barriers to discharge Decreased caregiver support         AM-PAC PT "6 Clicks" Mobility  Outcome Measure Help needed turning from your back to your side while in a flat bed without using bedrails?: None Help needed moving from lying on your back to sitting on the side of a flat bed without using bedrails?: None Help needed moving to and from a bed to a chair (including a wheelchair)?: A Little Help needed standing up from a chair using your arms (e.g., wheelchair or bedside chair)?: A Little Help needed to walk in hospital room?: A Lot Help needed climbing 3-5 steps with a railing? : Total 6 Click Score: 17    End of Session Equipment Utilized During Treatment: Gait belt;Oxygen Activity Tolerance: Other (comment)(limited by nausea) Patient left: in bed;with call bell/phone within reach;with bed alarm set Nurse Communication: Mobility status PT Visit Diagnosis: Unsteadiness on feet (R26.81);Other abnormalities of gait and mobility (R26.89);Muscle weakness (generalized) (M62.81);Difficulty in walking, not elsewhere classified (R26.2)    Time: 9678-9381 PT Time Calculation (min) (ACUTE ONLY): 17 min   Charges:   PT Evaluation $PT Eval Moderate Complexity: 1 Mod          Takelia Urieta B. Migdalia Dk PT, DPT Acute Rehabilitation Services Pager [phone] Office [phone]   [place] 01/14/2019, 4:40 PM

## 2019-01-14 NOTE — Progress Notes (Signed)
MD in see patient reported would order GI cocktail,prontonix IVP for patient reported thought was her hiatal hernia she has irritated it.

## 2019-01-14 NOTE — ED Notes (Signed)
ED TO INPATIENT HANDOFF REPORT  ED Nurse Name and Phone #: Almyra Free [phone]  S Name/Age/Gender Charlotte Castro 85 y.o. female Room/Bed: 043C/043C  Code Status   Code Status: Full Code  Home/SNF/Other Home Patient oriented to: self, place, time and situation Is this baseline? Yes   Triage Complete: Triage complete  Chief Complaint sob  Triage Note Pt reports chronic SOB but worse today, pt anxious in triage, HR 160 a fib rvr.. Denies CP   Allergies Allergies  Allergen Reactions  . Iodinated Diagnostic Agents Shortness Of Breath    Causes headaches, also  . Penicillins Other (See Comments)    Tolerated Zosyn Oct 2018- Patient is unaware this is an "allergy" Did it involve swelling of the face/tongue/throat, SOB, or low BP? Unk Did it involve sudden or severe rash/hives, skin peeling, or any reaction on the inside of your mouth or nose? Unk Did you need to seek medical attention at a hospital or doctor's office? Unk When did it last happen? Unk If all above answers are "NO", may proceed with cephalosporin use.     Level of Care/Admitting Diagnosis ED Disposition    ED Disposition Condition [facility] Area: [place] [[id number]]  Level of Care: Telemetry Medical [104]  I expect the patient will be discharged within 24 hours: No (not a candidate for 5C-Observation unit)  Covid Evaluation: Confirmed COVID Negative  Diagnosis: AKI (acute kidney injury) ([place]) [768115]  Admitting Physician: Neena Rhymes [[id number]]  Attending Physician: Adella Hare E [[id number]]  PT Class (Do Not Modify): Observation [104]  PT Acc Code (Do Not Modify): Observation [10022]       B Medical/Surgery History Past Medical History:  Diagnosis Date  . Anemia    history of  . Anxiety   . Arthritis    "left knee" (04/05/2017)  . Atrial fibrillation ([place])   . Atrial flutter ([place])    typical appearing  . Atrial tachycardia ([place])    ablated 11/17/10  by JA  from  the [facility] of the aorta  . Chest pain on exertion   . CHF (congestive heart failure) ([place])   . Chronic lower back pain   . Chronic nausea   . Dizziness    chronic and of an unclear etiology  . Dyspnea   . Endometrial cancer (H[facility])    grade 1  . Gallstone pancreatitis   . Gastritis   . GERD (gastroesophageal reflux disease)   . History of blood transfusion ~ 1948  . History of hiatal hernia   . HTN (hypertension)   . Hyperlipemia   . Hypothyroidism   . Left leg numbness   . Obesity   . Osteoporosis   . PMB (postmenopausal bleeding)   . Sinus headache   . Toe ulcer ([place])    left 3rd toe  . Vitamin D deficiency    Past Surgical History:  Procedure Laterality Date  . ATRIAL ABLATION SURGERY  11/17/10   Atrial tachycardia arising from [facility] of the aorta ablated by JA  . BACK SURGERY    . CARDIAC CATHETERIZATION  01/11/2011   Archie Endo 01/12/2011  . CHOLECYSTECTOMY N/A 04/08/2017   Procedure: LAPAROSCOPIC CHOLECYSTECTOMY;  Surgeon: Georganna Skeans, MD;  Location: [facility];  Service: General;  Laterality: N/A;  . FRACTURE SURGERY    . HYSTEROSCOPY W/D&C N/A 08/05/2017   Procedure: DILATATION AND CURETTAGE /HYSTEROSCOPY AND POLYPECTOMY;  Surgeon: Osborne Oman, MD;  Location: [facility];  Service: Gynecology;  Laterality: N/A;  . LUMBAR SPINE SURGERY  ~ 1998  . ROBOTIC ASSISTED TOTAL HYSTERECTOMY WITH BILATERAL SALPINGO OOPHERECTOMY Bilateral 09/20/2017   Procedure: XI ROBOTIC ASSISTED TOTAL HYSTERECTOMY WITH BILATERAL SALPINGO OOPHORECTOMY, SENTINAL LYMPH NODE BIOPSY;  Surgeon: Everitt Amber, MD;  Location: [facility];  Service: Gynecology;  Laterality: Bilateral;  . SHOULDER SURGERY Right 1984 X2   Rollene Rotunda 01/19/2011  . WRIST FRACTURE SURGERY Left 1983     A IV Location/Drains/Wounds Patient Lines/Drains/Airways Status   Active Line/Drains/Airways    Name:   Placement date:   Placement time:   Site:   Days:   Peripheral IV 01/13/19 Right Antecubital   01/13/19    1543    Antecubital   1    Incision (Closed) 04/08/17 Abdomen Other (Comment)   04/08/17    1351     646   Incision (Closed) 08/05/17 Perineum Other (Comment)   08/05/17    1148     527   Incision - 4 Ports Abdomen 1: Umbilicus 2: Mid;Upper 3: Right;Upper 4: Right;Lower   04/08/17    1423     646   Incision - 5 Ports Abdomen 1: Right;Lateral 2: Umbilicus 3: Left;Lateral;Upper 4: Left;Lateral;Mid 5: Left;Lateral;Lower   09/20/17    1125     481          Intake/Output Last 24 hours No intake or output data in the 24 hours ending 01/14/19 0137  Labs/Imaging Results for orders placed or performed during the hospital encounter of 01/13/19 (from the past 48 hour(s))  Troponin I (High Sensitivity)     Status: Abnormal   Collection Time: 01/13/19  4:15 PM  Result Value Ref Range   Troponin I (High Sensitivity) 19 (H) <18 ng/L    Comment: (NOTE) Elevated high sensitivity troponin I (hsTnI) values and significant  changes across serial measurements may suggest ACS but many other  chronic and acute conditions are known to elevate hsTnI results.  Refer to the "Links" section for chest pain algorithms and additional  guidance. Performed at [facility], [place] [address], [place], [place] [address]   CBC with Differential     Status: Abnormal   Collection Time: 01/13/19  4:15 PM  Result Value Ref Range   WBC 8.2 4.0 - 10.5 K/uL   RBC 4.91 3.87 - 5.11 MIL/uL   Hemoglobin 16.0 (H) 12.0 - 15.0 g/dL   HCT 45.0 36.0 - 46.0 %   MCV 91.6 80.0 - 100.0 fL   MCH 32.6 26.0 - 34.0 pg   MCHC 35.6 30.0 - 36.0 g/dL   RDW 13.2 11.5 - 15.5 %   Platelets 191 150 - 400 K/uL   nRBC 0.0 0.0 - 0.2 %   Neutrophils Relative % 62 %   Neutro Abs 5.1 1.7 - 7.7 K/uL   Lymphocytes Relative 25 %   Lymphs Abs 2.0 0.7 - 4.0 K/uL   Monocytes Relative 10 %   Monocytes Absolute 0.8 0.1 - 1.0 K/uL   Eosinophils Relative 2 %   Eosinophils Absolute 0.1 0.0 - 0.5 K/uL   Basophils Relative 1 %   Basophils Absolute 0.1 0.0 - 0.1 K/uL    Immature Granulocytes 0 %   Abs Immature Granulocytes 0.03 0.00 - 0.07 K/uL    Comment: Performed at [facility] [address], [place], [place] [address]  Comprehensive metabolic panel     Status: Abnormal   Collection Time: 01/13/19  4:15 PM  Result Value Ref Range  Sodium 139 135 - 145 mmol/L   Potassium 4.0 3.5 - 5.1 mmol/L   Chloride 107 98 - 111 mmol/L   CO2 19 (L) 22 - 32 mmol/L   Glucose, Bld 122 (H) 70 - 99 mg/dL   BUN 17 8 - 23 mg/dL   Creatinine, Ser 1.43 (H) 0.44 - 1.00 mg/dL   Calcium 10.2 8.9 - 10.3 mg/dL   Total Protein 7.0 6.5 - 8.1 g/dL   Albumin 4.0 3.5 - 5.0 g/dL   AST 25 15 - 41 U/L   ALT 16 0 - 44 U/L   Alkaline Phosphatase 68 38 - 126 U/L   Total Bilirubin 1.1 0.3 - 1.2 mg/dL   GFR calc non Af Amer 34 (L) >60 mL/min   GFR calc Af Amer 39 (L) >60 mL/min   Anion gap 13 5 - 15    Comment: Performed at [facility] [address], [place], [place] [address]  Protime-INR     Status: Abnormal   Collection Time: 01/13/19  4:15 PM  Result Value Ref Range   Prothrombin Time 28.1 (H) 11.4 - 15.2 seconds   INR 2.7 (H) 0.8 - 1.2    Comment: (NOTE) INR goal varies based on device and disease states. Performed at [facility], [place] [address] [place], [place] [address]   TSH     Status: Abnormal   Collection Time: 01/13/19  4:55 PM  Result Value Ref Range   TSH 5.280 (H) 0.350 - 4.500 uIU/mL    Comment: Performed by a 3rd Generation assay with a functional sensitivity of <=0.01 uIU/mL. Performed at [facility], [place] [address] [place], [place] [address]   SARS Coronavirus 2 [facility] order, Performed in [facility] hospital lab) Nasopharyngeal Nasopharyngeal Swab     Status: None   Collection Time: 01/13/19  5:27 PM   Specimen: Nasopharyngeal Swab  Result Value Ref Range   SARS Coronavirus 2 NEGATIVE NEGATIVE    Comment: (NOTE) If result is NEGATIVE SARS-CoV-2 target nucleic acids are NOT DETECTED. The SARS-CoV-2 RNA is generally  detectable in upper and lower  respiratory specimens during the acute phase of infection. The lowest  concentration of SARS-CoV-2 viral copies this assay can detect is 250  copies / mL. A negative result does not preclude SARS-CoV-2 infection  and should not be used as the sole basis for treatment or other  patient management decisions.  A negative result may occur with  improper specimen collection / handling, submission of specimen other  than nasopharyngeal swab, presence of viral mutation(s) within the  areas targeted by this assay, and inadequate number of viral copies  (<250 copies / mL). A negative result must be combined with clinical  observations, patient history, and epidemiological information. If result is POSITIVE SARS-CoV-2 target nucleic acids are DETECTED. The SARS-CoV-2 RNA is generally detectable in upper and lower  respiratory specimens dur ing the acute phase of infection.  Positive  results are indicative of active infection with SARS-CoV-2.  Clinical  correlation with patient history and other diagnostic information is  necessary to determine patient infection status.  Positive results do  not rule out bacterial infection or co-infection with other viruses. If result is PRESUMPTIVE POSTIVE SARS-CoV-2 nucleic acids MAY BE PRESENT.   A presumptive positive result was obtained on the submitted specimen  and confirmed on repeat testing.  While 2019 novel coronavirus  (SARS-CoV-2) nucleic acids may be present in the submitted sample  additional confirmatory  testing may be necessary for epidemiological  and / or clinical management purposes  to differentiate between  SARS-CoV-2 and other Sarbecovirus currently known to infect humans.  If clinically indicated additional testing with an alternate test  methodology [phone]) is advised. The SARS-CoV-2 RNA is generally  detectable in upper and lower respiratory sp ecimens during the acute  phase of infection. The  expected result is Negative. Fact Sheet for Patients:  StrictlyIdeas.no Fact Sheet for Healthcare Providers: BankingDealers.co.za This test is not yet approved or cleared by the [place] FDA and has been authorized for detection and/or diagnosis of SARS-CoV-2 by FDA under an Emergency Use Authorization (EUA).  This EUA will remain in effect (meaning this test can be used) for the duration of the COVID-19 declaration under Section 564(b)(1) of the Act, 21 U.S.C. section 360bbb-3(b)(1), unless the authorization is terminated or revoked sooner. Performed at [facility], [place] [address], [place], [place] [address]   Troponin I (High Sensitivity)     Status: Abnormal   Collection Time: 01/13/19  6:20 PM  Result Value Ref Range   Troponin I (High Sensitivity) 19 (H) <18 ng/L    Comment: (NOTE) Elevated high sensitivity troponin I (hsTnI) values and significant  changes across serial measurements may suggest ACS but many other  chronic and acute conditions are known to elevate hsTnI results.  Refer to the "Links" section for chest pain algorithms and additional  guidance. Performed at [facility], [place] [address] [place], [place] [address]   Urinalysis, Routine w reflex microscopic     Status: Abnormal   Collection Time: 01/13/19  8:42 PM  Result Value Ref Range   Color, Urine YELLOW YELLOW   APPearance HAZY (A) CLEAR   Specific Gravity, Urine 1.020 1.005 - 1.030   pH 5.0 5.0 - 8.0   Glucose, UA NEGATIVE NEGATIVE mg/dL   Hgb urine dipstick SMALL (A) NEGATIVE   Bilirubin Urine NEGATIVE NEGATIVE   Ketones, ur 5 (A) NEGATIVE mg/dL   Protein, ur 30 (A) NEGATIVE mg/dL   Nitrite NEGATIVE NEGATIVE   Leukocytes,Ua MODERATE (A) NEGATIVE   RBC / HPF 0-5 0 - 5 RBC/hpf   WBC, UA 21-50 0 - 5 WBC/hpf   Bacteria, UA RARE (A) NONE SEEN   Squamous Epithelial / LPF 6-10 0 - 5   Mucus PRESENT    Hyaline Casts, UA PRESENT      Comment: Performed at [facility], [place] [address], [place], [place] [address]   Dg Chest 2 View  Result Date: 01/13/2019 CLINICAL DATA:  Shortness of breath and chest pain EXAM: CHEST - 2 VIEW COMPARISON:  June 10, 2017 chest radiograph and chest CT Oct 26, 2017 FINDINGS: There is no edema or consolidation. Heart size and pulmonary vascularity are normal. No adenopathy. There is a large paraesophageal hernia. No bone lesions. No pneumothorax. IMPRESSION: Large paraesophageal hernia. No edema or consolidation. Stable cardiac silhouette. Electronically Signed   By: Lowella Grip III M.D.   On: 01/13/2019 16:42    Pending Labs Unresulted Labs (From admission, onward)    Start     Ordered   01/15/19 6767  Basic metabolic panel  Tomorrow morning,   R     01/14/19 0040   01/14/19 0700  Urinalysis, Routine w reflex microscopic  Once,   STAT     01/14/19 0124   01/13/19 2222  Urine culture  Add-on,   AD     01/13/19 2222  Vitals/Pain Today's Vitals   01/13/19 2200 01/14/19 0040 01/14/19 0100 01/14/19 0105  BP: (!) 163/84 (!) 146/100 (!) 160/78   Pulse: 78 99 81   Resp: 19 (!) 24 20   Temp:    (!) 97.2 F (36.2 C)  TempSrc:    Oral  SpO2: 98% 96% 99%   PainSc:        Isolation Precautions No active isolations  Medications Medications  0.9 %  sodium chloride infusion ( Intravenous Not Given 01/14/19 0114)  metoprolol tartrate (LOPRESSOR) tablet 50 mg (has no administration in time range)  levothyroxine (SYNTHROID) tablet 88 mcg (has no administration in time range)  vitamin B-12 (CYANOCOBALAMIN) tablet 1,000 mcg (has no administration in time range)  warfarin (COUMADIN) tablet 2.5-5 mg (has no administration in time range)  dextrose 5 %-0.45 % sodium chloride infusion ( Intravenous New Bag/Given 01/14/19 0122)  acetaminophen (TYLENOL) tablet 650 mg (has no administration in time range)    Or  acetaminophen (TYLENOL) suppository 650 mg (has no administration in  time range)  docusate sodium (COLACE) capsule 100 mg (100 mg Oral Not Given 01/14/19 0113)  metoprolol tartrate (LOPRESSOR) injection 5 mg (5 mg Intravenous Given 01/13/19 1656)  LORazepam (ATIVAN) tablet 0.5 mg (0.5 mg Oral Given 01/13/19 1652)  sodium chloride 0.9 % bolus 1,000 mL (0 mLs Intravenous Stopped 01/13/19 1900)  LORazepam (ATIVAN) tablet 0.5 mg (0.5 mg Oral Given 01/13/19 1723)    Mobility walks with device Moderate fall risk   Focused Assessments Lungs:WNL O2: RA   R Recommendations: See Admitting Provider Note  Report given to:   Additional Notes:

## 2019-01-14 NOTE — Evaluation (Addendum)
Occupational Therapy Evaluation Patient Details Name: Charlotte Castro MRN: [id number] DOB: 02-23-1933 Today's Date: 01/14/2019    History of Present Illness Pt is an 85 yo female s/p afib with RVR, acute kidney injury and SOB. Pt PMHx: anxiety, arthritis, endometrial CA, HTN, HLD, back sx, shoulder and wrist sx.   Clinical Impression   Pt PTA: living alone and reports independence with ADL and ambulates with SPC. Pt currently limited for poor activity tolerance and not feeling well. Pt with N/V/D episode and RN aware. BP taken: sitting 138/86; standing 136/92 s/p exertion; 134/99 after rest. Pt minguardA for ADL in standing and supervisionA in sitting; minguardA for mobility in room with SPC. Pt repots dizziness and blurry vision in standing- not orthostatic at this time. Pt requires increased assist and would benefit from continued OT skilled services for ADL, mobility and energy conservation. OT following.  Pt would benefit form additional stay overnight as pt with V/V/D episode and no assist at home.  HR 77-99 BPM throughout exertion    Follow Up Recommendations  Home health OT;Supervision/Assistance - 24 hour(once pt is feeling better)    Equipment Recommendations  None recommended by OT    Recommendations for Other Services PT consult(ordered)     Precautions / Restrictions Precautions Precautions: Fall Restrictions Weight Bearing Restrictions: No      Mobility Bed Mobility Overal bed mobility: Needs Assistance Bed Mobility: Supine to Sit     Supine to sit: Supervision     General bed mobility comments: for safety  Transfers Overall transfer level: Needs assistance Equipment used: Straight cane Transfers: Sit to/from Stand;Stand Pivot Transfers Sit to Stand: Min guard Stand pivot transfers: Min guard       General transfer comment: minguardA for stability in standing when trying to get vitals. Pt not feeling well- nausea.    Balance                                            ADL either performed or assessed with clinical judgement   ADL Overall ADL's : At baseline                                       General ADL Comments: Pt performing own toilet hygiene in standing and grooming briefly at sink. Pt requiring assist as pt was having N/V/D episode and did not feel well. RN alerted.     Vision Baseline Vision/History: Wears glasses Wears Glasses: At all times Vision Assessment?: Yes Eye Alignment: Within Functional Limits Ocular Range of Motion: Within Functional Limits Additional Comments: reports blurriness with standing.     Perception     Praxis      Pertinent Vitals/Pain Pain Assessment: Faces Faces Pain Scale: Hurts little more Pain Location: b/l knees Pain Descriptors / Indicators: Discomfort Pain Intervention(s): Limited activity within patient's tolerance     Hand Dominance Right   Extremity/Trunk Assessment Upper Extremity Assessment Upper Extremity Assessment: Overall WFL for tasks assessed   Lower Extremity Assessment Lower Extremity Assessment: Generalized weakness;Overall [facility] for tasks assessed   Cervical / Trunk Assessment Cervical / Trunk Assessment: Normal   Communication Communication Communication: No difficulties   Cognition Arousal/Alertness: Awake/alert Behavior During Therapy: WFL for tasks assessed/performed Overall Cognitive Status: Within Functional Limits for tasks assessed  General Comments  Pt with N/V/D episode and RN aware. BP taken: sitting 138/86; standing 136/92 s/p exertion; 134/99 after rest.    Exercises     Shoulder Instructions      Home Living Family/patient expects to be discharged to:: Private residence Living Arrangements: Alone Available Help at Discharge: Family;Available PRN/intermittently Type of Home: House Home Access: Ramped entrance     Home Layout: One level      Bathroom Shower/Tub: Other (comment)(does not use- sponge bathes)   Bathroom Toilet: Standard     Home Equipment: Cane - single point;Walker - 4 wheels;Bedside commode;Wheelchair - manual          Prior Functioning/Environment Level of Independence: Independent with assistive device(s)                 OT Problem List: Decreased strength;Decreased activity tolerance;Impaired balance (sitting and/or standing);Decreased coordination;Decreased safety awareness;Pain;Impaired vision/perception      OT Treatment/Interventions: Self-care/ADL training;Therapeutic exercise;Energy conservation;DME and/or AE instruction;Therapeutic activities;Visual/perceptual remediation/compensation;Patient/family education;Balance training    OT Goals(Current goals can be found in the care plan section) Acute Rehab OT Goals Patient Stated Goal: to stop feeling so bad OT Goal Formulation: With patient Time For Goal Achievement: 01/28/19 Potential to Achieve Goals: Good ADL Goals Pt Will Perform Grooming: with modified independence;standing Pt Will Perform Lower Body Dressing: with modified independence;sit to/from stand Pt Will Perform Toileting - Clothing Manipulation and hygiene: with modified independence;sit to/from stand Additional ADL Goal #1: Pt will perform OOB ADL with modified independence with no safety cues.  OT Frequency: Min 2X/week   Barriers to D/C: Decreased caregiver support  her son in [place] has health issues and D lives in [place] OT "6 Clicks" Daily Activity     Outcome Measure Help from another person eating meals?: None Help from another person taking care of personal grooming?: A Little Help from another person toileting, which includes using toliet, bedpan, or urinal?: A Little Help from another person bathing (including washing, rinsing, drying)?: A Little Help from another person to put on and taking off regular upper body  clothing?: None Help from another person to put on and taking off regular lower body clothing?: A Little 6 Click Score: 20   End of Session Equipment Utilized During Treatment: Gait belt Nurse Communication: Mobility status  Activity Tolerance:   Patient left: in chair;with call bell/phone within reach;with chair alarm set  OT Visit Diagnosis: Unsteadiness on feet (R26.81);Muscle weakness (generalized) (M62.81)                Time: 1610-9604 OT Time Calculation (min): 39 min Charges:  OT General Charges $OT Visit: 1 Visit OT Evaluation $OT Eval Moderate Complexity: 1 Mod OT Treatments $Self Care/Home Management : 23-37 mins  Ebony Hail Harold Hedge) Marsa Aris OTR/L Acute Rehabilitation Services Pager: [phone] Office: [place] 01/14/2019, 8:35 AM

## 2019-01-14 NOTE — Progress Notes (Signed)
MD informed of cont n/v and elevated bp with patient still c/o abd pain even after dilaudid given. RR called informed of patient condition reported would come see patient charge nurse aware.

## 2019-01-15 ENCOUNTER — Encounter (HOSPITAL_COMMUNITY): Payer: Self-pay

## 2019-01-15 DIAGNOSIS — Z91041 Radiographic dye allergy status: Secondary | ICD-10-CM | POA: Diagnosis not present

## 2019-01-15 DIAGNOSIS — Z880 Allergy status to penicillin: Secondary | ICD-10-CM | POA: Diagnosis not present

## 2019-01-15 DIAGNOSIS — Z79899 Other long term (current) drug therapy: Secondary | ICD-10-CM | POA: Diagnosis not present

## 2019-01-15 DIAGNOSIS — R441 Visual hallucinations: Secondary | ICD-10-CM

## 2019-01-15 DIAGNOSIS — I4891 Unspecified atrial fibrillation: Secondary | ICD-10-CM | POA: Diagnosis not present

## 2019-01-15 DIAGNOSIS — Z8249 Family history of ischemic heart disease and other diseases of the circulatory system: Secondary | ICD-10-CM | POA: Diagnosis not present

## 2019-01-15 DIAGNOSIS — F05 Delirium due to known physiological condition: Secondary | ICD-10-CM | POA: Diagnosis present

## 2019-01-15 DIAGNOSIS — Z7901 Long term (current) use of anticoagulants: Secondary | ICD-10-CM | POA: Diagnosis not present

## 2019-01-15 DIAGNOSIS — R410 Disorientation, unspecified: Secondary | ICD-10-CM | POA: Diagnosis not present

## 2019-01-15 DIAGNOSIS — K449 Diaphragmatic hernia without obstruction or gangrene: Secondary | ICD-10-CM | POA: Diagnosis present

## 2019-01-15 DIAGNOSIS — M810 Age-related osteoporosis without current pathological fracture: Secondary | ICD-10-CM | POA: Diagnosis present

## 2019-01-15 DIAGNOSIS — F411 Generalized anxiety disorder: Secondary | ICD-10-CM

## 2019-01-15 DIAGNOSIS — Z79890 Hormone replacement therapy (postmenopausal): Secondary | ICD-10-CM | POA: Diagnosis not present

## 2019-01-15 DIAGNOSIS — K219 Gastro-esophageal reflux disease without esophagitis: Secondary | ICD-10-CM | POA: Diagnosis present

## 2019-01-15 DIAGNOSIS — R0602 Shortness of breath: Secondary | ICD-10-CM | POA: Diagnosis present

## 2019-01-15 DIAGNOSIS — I959 Hypotension, unspecified: Secondary | ICD-10-CM | POA: Diagnosis present

## 2019-01-15 DIAGNOSIS — M545 Low back pain: Secondary | ICD-10-CM | POA: Diagnosis present

## 2019-01-15 DIAGNOSIS — R0789 Other chest pain: Secondary | ICD-10-CM | POA: Diagnosis present

## 2019-01-15 DIAGNOSIS — E039 Hypothyroidism, unspecified: Secondary | ICD-10-CM | POA: Diagnosis present

## 2019-01-15 DIAGNOSIS — N179 Acute kidney failure, unspecified: Secondary | ICD-10-CM | POA: Diagnosis present

## 2019-01-15 DIAGNOSIS — I5032 Chronic diastolic (congestive) heart failure: Secondary | ICD-10-CM | POA: Diagnosis present

## 2019-01-15 DIAGNOSIS — I110 Hypertensive heart disease with heart failure: Secondary | ICD-10-CM | POA: Diagnosis present

## 2019-01-15 DIAGNOSIS — E559 Vitamin D deficiency, unspecified: Secondary | ICD-10-CM | POA: Diagnosis present

## 2019-01-15 DIAGNOSIS — M1712 Unilateral primary osteoarthritis, left knee: Secondary | ICD-10-CM | POA: Diagnosis present

## 2019-01-15 DIAGNOSIS — I10 Essential (primary) hypertension: Secondary | ICD-10-CM | POA: Diagnosis not present

## 2019-01-15 DIAGNOSIS — Z8542 Personal history of malignant neoplasm of other parts of uterus: Secondary | ICD-10-CM | POA: Diagnosis not present

## 2019-01-15 DIAGNOSIS — E785 Hyperlipidemia, unspecified: Secondary | ICD-10-CM | POA: Diagnosis present

## 2019-01-15 DIAGNOSIS — F0390 Unspecified dementia without behavioral disturbance: Secondary | ICD-10-CM

## 2019-01-15 DIAGNOSIS — I4821 Permanent atrial fibrillation: Secondary | ICD-10-CM | POA: Diagnosis present

## 2019-01-15 DIAGNOSIS — N390 Urinary tract infection, site not specified: Secondary | ICD-10-CM | POA: Diagnosis present

## 2019-01-15 DIAGNOSIS — Z20828 Contact with and (suspected) exposure to other viral communicable diseases: Secondary | ICD-10-CM | POA: Diagnosis present

## 2019-01-15 DIAGNOSIS — G9341 Metabolic encephalopathy: Secondary | ICD-10-CM | POA: Diagnosis present

## 2019-01-15 DIAGNOSIS — Z8589 Personal history of malignant neoplasm of other organs and systems: Secondary | ICD-10-CM | POA: Diagnosis not present

## 2019-01-15 DIAGNOSIS — G8929 Other chronic pain: Secondary | ICD-10-CM | POA: Diagnosis present

## 2019-01-15 LAB — CBC
HCT: 40.7 % (ref 36.0–46.0)
Hemoglobin: 14.9 g/dL (ref 12.0–15.0)
MCH: 32.8 pg (ref 26.0–34.0)
MCHC: 36.6 g/dL — ABNORMAL HIGH (ref 30.0–36.0)
MCV: 89.6 fL (ref 80.0–100.0)
Platelets: 148 10*3/uL — ABNORMAL LOW (ref 150–400)
RBC: 4.54 MIL/uL (ref 3.87–5.11)
RDW: 13 % (ref 11.5–15.5)
WBC: 9 10*3/uL (ref 4.0–10.5)
nRBC: 0 % (ref 0.0–0.2)

## 2019-01-15 LAB — COMPREHENSIVE METABOLIC PANEL
ALT: 16 U/L (ref 0–44)
AST: 29 U/L (ref 15–41)
Albumin: 3.5 g/dL (ref 3.5–5.0)
Alkaline Phosphatase: 61 U/L (ref 38–126)
Anion gap: 13 (ref 5–15)
BUN: 13 mg/dL (ref 8–23)
CO2: 20 mmol/L — ABNORMAL LOW (ref 22–32)
Calcium: 9.3 mg/dL (ref 8.9–10.3)
Chloride: 109 mmol/L (ref 98–111)
Creatinine, Ser: 1.16 mg/dL — ABNORMAL HIGH (ref 0.44–1.00)
GFR calc Af Amer: 50 mL/min — ABNORMAL LOW (ref >60)
GFR calc non Af Amer: 43 mL/min — ABNORMAL LOW (ref >60)
Glucose, Bld: 110 mg/dL — ABNORMAL HIGH (ref 70–99)
Potassium: 3.4 mmol/L — ABNORMAL LOW (ref 3.5–5.1)
Sodium: 142 mmol/L (ref 135–145)
Total Bilirubin: 2 mg/dL — ABNORMAL HIGH (ref 0.3–1.2)
Total Protein: 6.2 g/dL — ABNORMAL LOW (ref 6.5–8.1)

## 2019-01-15 LAB — PROTIME-INR
INR: 3.1 — ABNORMAL HIGH (ref 0.8–1.2)
Prothrombin Time: 31.3 seconds — ABNORMAL HIGH (ref 11.4–15.2)

## 2019-01-15 LAB — URINE CULTURE: Culture: 40000 — AB

## 2019-01-15 LAB — MAGNESIUM
Magnesium: 1.7 mg/dL (ref 1.7–2.4)
Magnesium: 1.8 mg/dL (ref 1.7–2.4)

## 2019-01-15 MED ORDER — FOSFOMYCIN TROMETHAMINE 3 G PO PACK
3.0000 g | PACK | Freq: Once | ORAL | Status: AC
  Administered 2019-01-15: 3 g via ORAL
  Filled 2019-01-15: qty 3

## 2019-01-15 MED ORDER — POTASSIUM CHLORIDE CRYS ER 20 MEQ PO TBCR
40.0000 meq | EXTENDED_RELEASE_TABLET | Freq: Two times a day (BID) | ORAL | Status: AC
  Administered 2019-01-15: 40 meq via ORAL
  Filled 2019-01-15 (×2): qty 2

## 2019-01-15 MED ORDER — QUETIAPINE FUMARATE 25 MG PO TABS: 25.0000 mg | ORAL_TABLET | Freq: Two times a day (BID) | ORAL | Status: DC | PRN

## 2019-01-15 MED ORDER — HALOPERIDOL LACTATE 5 MG/ML IJ SOLN
2.0000 mg | Freq: Four times a day (QID) | INTRAMUSCULAR | Status: DC | PRN
  Administered 2019-01-15 – 2019-01-18 (×2): 2 mg via INTRAVENOUS
  Filled 2019-01-15 (×3): qty 1

## 2019-01-15 MED ORDER — WARFARIN SODIUM 2.5 MG PO TABS
2.5000 mg | ORAL_TABLET | Freq: Once | ORAL | Status: AC
  Administered 2019-01-15: 2.5 mg via ORAL
  Filled 2019-01-15: qty 1

## 2019-01-15 NOTE — NC FL2 (Signed)
[place] LEVEL OF CARE SCREENING TOOL     IDENTIFICATION  Patient Name: Charlotte Castro Birthdate: 10-Jul-1932 Sex: female Admission Date (Current Location): 01/13/2019  [facility] and [place] Number:  Herbalist and Address:  [facility], [place] [address], [place], [place] [address]      Provider Number: [id number]  Attending Physician Name and Address:  Rodena Goldmann, DO  Relative Name and Phone Number:  Rashena Dowling, daughter, [phone]    Current Level of Care: Hospital Recommended Level of Care: [place] Prior Approval Number:    Date Approved/Denied:   PASRR Number: [id number]  Discharge Plan: SNF    Current Diagnoses: Patient Active Problem List   Diagnosis Date Noted  . AKI (acute kidney injury) ([place]) 01/14/2019  . B12 deficiency 08/08/2018  . Hypothyroidism 08/08/2018  . Endometrial cancer ([place]) 08/05/2017  . Toe ulcer, left, with unspecified severity ([place]) 08/04/2017  . 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 Postmenopausal bleeding 08/01/2017  . Acute delirium 04/14/2016  . Tremor 04/14/2016  . Chronic diastolic CHF (congestive heart failure) ([place]) 04/10/2016  . Essential hypertension, benign 01/18/2014  . Long term current use of anticoagulant therapy 01/18/2014  . Permanent atrial fibrillation 06/10/2011    Orientation RESPIRATION BLADDER Height & Weight     Self, Time, Situation, Place(increased confusion)  O2(Nasal Cannula 3L) Continent Weight: 200 lb 2.8 oz (90.8 kg)(scale b) Height:  5\' 8"  (172.7 cm)  BEHAVIORAL SYMPTOMS/MOOD NEUROLOGICAL BOWEL NUTRITION STATUS      Continent Diet(clear liquid, will change, see d/c summary)  AMBULATORY STATUS COMMUNICATION OF NEEDS Skin   Limited Assist Verbally Normal                       Personal Care Assistance Level of Assistance  Dressing, Bathing, Feeding Bathing Assistance: Limited  assistance Feeding assistance: Independent Dressing Assistance: Limited assistance     Functional Limitations Info  Sight, Speech, Hearing Sight Info: Adequate Hearing Info: Adequate Speech Info: Adequate    SPECIAL CARE FACTORS FREQUENCY  PT (By licensed PT), OT (By licensed OT)     PT Frequency: 5x OT Frequency: 5x            Contractures Contractures Info: Not present    Additional Factors Info  Code Status, Allergies Code Status Info: Full Code Allergies Info: Iodinated Diagnostic Agents, Penicillins           Current Medications (01/15/2019):  This is the current hospital active medication list Current Facility-Administered Medications  Medication Dose Route Frequency Provider Last Rate Last Dose  . acetaminophen (TYLENOL) tablet 650 mg  650 mg Oral Q6H PRN Neena Rhymes, MD   650 mg at 01/14/19 0747   Or  . acetaminophen (TYLENOL) suppository 650 mg  650 mg Rectal Q6H PRN Norins, Heinz Knuckles, MD      . docusate sodium (COLACE) capsule 100 mg  100 mg Oral BID Neena Rhymes, MD   100 mg at 01/15/19 0939  . famotidine (PEPCID) IVPB 20 mg premix  20 mg Intravenous Daily Barb Merino, MD 100 mL/hr at 01/15/19 0939 20 mg at 01/15/19 0939  . fosfomycin (MONUROL) packet 3 g  3 g Oral Once Manuella Ghazi, Pratik D, DO      . haloperidol lactate (HALDOL) injection 2 mg  2 mg Intravenous Q6H PRN Manuella Ghazi, Pratik D, DO      .  HYDROmorphone (DILAUDID) injection 0.5 mg  0.5 mg Intravenous Q3H PRN Manuella Ghazi, Pratik D, DO   0.5 mg at 01/15/19 0452  . lactated ringers infusion   Intravenous Continuous Heath Lark D, DO 100 mL/hr at 01/15/19 0943    . levothyroxine (SYNTHROID) tablet 88 mcg  88 mcg Oral QAC breakfast Neena Rhymes, MD   88 mcg at 01/14/19 0747  . metoprolol tartrate (LOPRESSOR) tablet 50 mg  50 mg Oral BID WC Norins, Heinz Knuckles, MD   50 mg at 01/15/19 0939  . ondansetron (ZOFRAN) injection 4 mg  4 mg Intravenous Q6H PRN Manuella Ghazi, Pratik D, DO   4 mg at 01/14/19 1756  .  potassium chloride SA (K-DUR) CR tablet 40 mEq  40 mEq Oral BID Manuella Ghazi, Pratik D, DO      . vitamin B-12 (CYANOCOBALAMIN) tablet 1,000 mcg  1,000 mcg Oral Daily Norins, Heinz Knuckles, MD   1,000 mcg at 01/15/19 [phone]  . Warfarin - Pharmacist Dosing Inpatient   Does not apply q1800 Kris Mouton, [facility]         Discharge Medications: Please see discharge summary for a list of discharge medications.  Relevant Imaging Results:  Relevant Lab Results:   Additional Information [phone]  Gerrianne Scale Jocelynne Duquette, LCSW

## 2019-01-15 NOTE — TOC Initial Note (Addendum)
Transition of Care [facility]) - Initial/Assessment Note    Patient Details  Name: Charlotte Castro MRN: [id number] Date of Birth: [birth year]  Transition of [facility]) CM/SW Contact:    Eileen Stanford, LCSW Phone Number: 01/15/2019, 1:35 PM  Clinical Narrative:    Pt having increased confusion. CSW spoke with pt's daughter via telephone. Pt's daughter confirms family agreeable that pt needs to go to SNF. Pt lives alone. Pt's family would like pt to go to [facility]- pt has been there before.  According to PT note pt states she will refuse SNF. Psych to evaluate to determine if pt has the capacity to make this decision. CSW will await psych eval before proceeding with placement.   Expected Discharge Plan: Skilled Nursing Facility Barriers to Discharge: Continued Medical Work up   Patient Goals and CMS Choice Patient states their goals for this hospitalization and ongoing recovery are:: \" to get her to [facility]"   Choice offered to / list presented to : Adult Children  Expected Discharge Plan and Services Expected Discharge Plan: [place] In-house Referral: NA   Post Acute Care Choice: NA Living arrangements for the past 2 months: Single Family Home Expected Discharge Date: 01/16/19                         HH Arranged: NA          Prior Living Arrangements/Services Living arrangements for the past 2 months: Single Family Home Lives with:: Self Patient language and need for interpreter reviewed:: Yes Do you feel safe going back to the place where you live?: Yes      Need for Family Participation in Patient Care: Yes (Comment) Care giver support system in place?: Yes (comment)   Criminal Activity/Legal Involvement Pertinent to Current Situation/Hospitalization: No - Comment as needed  Activities of Daily Living Home Assistive Devices/Equipment: Cane (specify quad or straight), Walker (specify type)(quad) ADL Screening (condition at time of  admission) Patient's cognitive ability adequate to safely complete daily activities?: Yes Is the patient deaf or have difficulty hearing?: No Does the patient have difficulty seeing, even when wearing glasses/contacts?: No Does the patient have difficulty concentrating, remembering, or making decisions?: No Patient able to express need for assistance with ADLs?: Yes Does the patient have difficulty dressing or bathing?: No Independently performs ADLs?: Yes (appropriate for developmental age) Does the patient have difficulty walking or climbing stairs?: No Weakness of Legs: None Weakness of Arms/Hands: None  Permission Sought/Granted Permission sought to share information with : Family Supports, Pharmacist, community Information with NAME: Tammie  Permission granted to share info w AGENCY: [place] granted to share info w Relationship: daughter  Permission granted to share info w Contact Information: [phone]  Emotional Assessment Appearance:: Appears stated age Attitude/Demeanor/Rapport: Unable to Assess Affect (typically observed): Unable to Assess Orientation: : Fluctuating Orientation (Suspected and/or reported Sundowners)(pt having halliculations and increased confusion) Alcohol / Substance Use: Not Applicable Psych Involvement: No (comment)  Admission diagnosis:  Elevated troponin [R79.89] AKI (acute kidney injury) ([place]) [N17.9] Atrial fibrillation with RVR ([facility]) [I48.91] Chest pain, unspecified type [R07.9] Patient Active Problem List   Diagnosis Date Noted  . AKI (acute kidney injury) ([place]) 01/14/2019  . B12 deficiency 08/08/2018  . Hypothyroidism 08/08/2018  . Endometrial cancer ([place]) 08/05/2017  . Toe ulcer, left, with unspecified severity ([place]) 08/04/2017  . 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 other  part of foot ([place]) 08/04/2017  . Postmenopausal bleeding 08/01/2017  . Acute delirium 04/14/2016  .  Tremor 04/14/2016  . Chronic diastolic CHF (congestive heart failure) ([place]) 04/10/2016  . Essential hypertension, benign 01/18/2014  . Long term current use of anticoagulant therapy 01/18/2014  . Permanent atrial fibrillation 06/10/2011   PCP:  Isaac Bliss, Rayford Halsted, MD Pharmacy:   [place], [place] - [address] [place] [address] Phone: [phone] Fax: [phone]  [facility] [place], [place] [address] Phone: [phone] Fax: [phone]     Social Determinants of Health (SDOH) Interventions    Readmission Risk Interventions No flowsheet data found.

## 2019-01-15 NOTE — Progress Notes (Signed)
ANTICOAGULATION CONSULT NOTE - Follow Up Consult  Pharmacy Consult for Warfarin Indication: atrial fibrillation  Allergies  Allergen Reactions  . Iodinated Diagnostic Agents Shortness Of Breath    Causes headaches, also  . Penicillins Other (See Comments)    Tolerated Zosyn Oct 2018- Patient is unaware this is an "allergy" Did it involve swelling of the face/tongue/throat, SOB, or low BP? Unk Did it involve sudden or severe rash/hives, skin peeling, or any reaction on the inside of your mouth or nose? Unk Did you need to seek medical attention at a hospital or doctor's office? Unk When did it last happen? Unk If all above answers are "NO", may proceed with cephalosporin use.     Patient Measurements: Height: 5\' 8"  (172.7 cm) Weight: 200 lb 2.8 oz (90.8 kg)(scale b) IBW/kg (Calculated) : 63.9  Vital Signs: Temp: 97.4 F (36.3 C) (08/03 0424) BP: 118/62 (08/03 0925) Pulse Rate: 89 (08/03 0925)  Labs: Recent Labs    01/13/19 0958 01/13/19 1615 01/13/19 1820 01/14/19 0522 01/15/19 0546  HGB  --  16.0*  --   --  14.9  HCT  --  45.0  --   --  40.7  PLT  --  191  --   --  148*  LABPROT  --  28.1*  --  29.6* 31.3*  INR  --  2.7*  --  2.9* 3.1*  CREATININE 1.16* 1.43*  --   --  1.16*  TROPONINIHS  --  19* 19*  --   --     Estimated Creatinine Clearance: 42.6 mL/min (A) (by C-G formula based on SCr of 1.16 mg/dL (H)).  Assessment:  85 yr old female on warfarin PTA for atrial fibrillation.  INR 2.7 on admit 8/1.  Has trended up to 3.1, just above therapeutic range.  Usual dose of 2.5 mg given on 8/2.    PTA regimen:  5 mg daily except 2.5 mg on Sundays.  Goal of Therapy:  INR 2-3 Monitor platelets by anticoagulation protocol: Yes   Plan:   Repeat Warfarin 2.5 mg today. Half usual dose d/t INR just above goal.  Daily PT/INR.  Arty Baumgartner, [place] Pager: [phone] or phone: [phone] 01/15/2019,3:51 PM

## 2019-01-15 NOTE — Consult Note (Addendum)
Telepsych Consultation   Reason for Consult:  Hallucinations/confusion Referring Physician:  Dr. Heath Lark Location of Patient:  [facility] Location of Provider: [facility]  Patient Identification: Charlotte Castro MRN:  [id number] Principal Diagnosis: Delirium Diagnosis:  Active Problems:   Permanent atrial fibrillation   Essential hypertension, benign   Long term current use of anticoagulant therapy   Chronic diastolic CHF (congestive heart failure) ([place])   Hypothyroidism   AKI (acute kidney injury) ([place])   Total Time spent with patient: 1 hour  Subjective:   Charlotte Castro is a 85 y.o. female patient admitted with atrial fibrillation.  HPI:   Per chart review, patient was admitted with atrial fibrillation. She presented with complaint of SOB and generalized weakness. Her hospital course has been complicated by AKI and UTI. Psychiatry was consulted for worsening confusion and hallucinations overnight. She endorses VH of bugs and pets in her room. She has a history of dementia and anxiety. She was given a total of 1 mg of Ativan on 8/1. Her daughter reports that she has a history of sundowning and has had similar presentations when admitted to the hospital in the past. She is recommended for SNF placement due to deconditioning and decreased mobility.   On interview, Charlotte Castro reports that she is doing "terrible." She reports pain secondary to arthritis. She also reports multiple other somatic complaints.  She is oriented to person, place and situation. She reports anxiety related to a decline in her health specifically worsening weakness. She denies SI, HI or AVH.   Past Psychiatric History: Dementia and anxiety   Risk to Self:  None. Denies SI.  Risk to Others:  None. Denies HI.  Prior Inpatient Therapy:  Denies  Prior Outpatient Therapy:  Denies   Past Medical History:  Past Medical History:  Diagnosis Date  . Anemia    history of  . Anxiety   . Arthritis     "left knee" (04/05/2017)  . Atrial fibrillation ([place])   . Atrial flutter ([place])    typical appearing  . Atrial tachycardia ([place])    ablated 11/17/10  by JA  from the [facility] of the aorta  . Chest pain on exertion   . CHF (congestive heart failure) ([place])   . Chronic lower back pain   . Chronic nausea   . Dizziness    chronic and of an unclear etiology  . Dyspnea   . Endometrial cancer (HCC)    grade 1  . Gallstone pancreatitis   . Gastritis   . GERD (gastroesophageal reflux disease)   . History of blood transfusion ~ 1948  . History of hiatal hernia   . HTN (hypertension)   . Hyperlipemia   . Hypothyroidism   . Left leg numbness   . Obesity   . Osteoporosis   . PMB (postmenopausal bleeding)   . Sinus headache   . Toe ulcer ([place])    left 3rd toe  . Vitamin D deficiency     Past Surgical History:  Procedure Laterality Date  . ATRIAL ABLATION SURGERY  11/17/10   Atrial tachycardia arising from [facility] of the aorta ablated by JA  . BACK SURGERY    . CARDIAC CATHETERIZATION  01/11/2011   Archie Endo 01/12/2011  . CHOLECYSTECTOMY N/A 04/08/2017   Procedure: LAPAROSCOPIC CHOLECYSTECTOMY;  Surgeon: Georganna Skeans, MD;  Location: [facility];  Service: General;  Laterality: N/A;  . FRACTURE SURGERY    . HYSTEROSCOPY W/D&C N/A 08/05/2017   Procedure: DILATATION AND CURETTAGE /  HYSTEROSCOPY AND POLYPECTOMY;  Surgeon: Osborne Oman, MD;  Location: [facility];  Service: Gynecology;  Laterality: N/A;  . LUMBAR SPINE SURGERY  ~ 1998  . ROBOTIC ASSISTED TOTAL HYSTERECTOMY WITH BILATERAL SALPINGO OOPHERECTOMY Bilateral 09/20/2017   Procedure: XI ROBOTIC ASSISTED TOTAL HYSTERECTOMY WITH BILATERAL SALPINGO OOPHORECTOMY, SENTINAL LYMPH NODE BIOPSY;  Surgeon: Everitt Amber, MD;  Location: [facility];  Service: Gynecology;  Laterality: Bilateral;  . SHOULDER SURGERY Right 1984 X2   Rollene Rotunda 01/19/2011  . WRIST FRACTURE SURGERY Left 1983   Family History:  Family History  Problem Relation Age of Onset  . Heart attack Father    . Hypertension Father    Family Psychiatric  History: Denies  Social History:  Social History   Substance and Sexual Activity  Alcohol Use No     Social History   Substance and Sexual Activity  Drug Use No    Social History   Socioeconomic History  . Marital status: Widowed    Spouse name: Not on file  . Number of children: 2  . Years of education: 15  . Highest education level: Not on file  Occupational History  . Not on file  Social Needs  . Financial resource strain: Not on file  . Food insecurity    Worry: Not on file    Inability: Not on file  . Transportation needs    Medical: Not on file    Non-medical: Not on file  Tobacco Use  . Smoking status: Never Smoker  . Smokeless tobacco: Never Used  Substance and Sexual Activity  . Alcohol use: No  . Drug use: No  . Sexual activity: Never    Birth control/protection: Post-menopausal  Lifestyle  . Physical activity    Days per week: Not on file    Minutes per session: Not on file  . Stress: Not on file  Relationships  . Social Herbalist on phone: Not on file    Gets together: Not on file    Attends religious service: Not on file    Active member of club or organization: Not on file    Attends meetings of clubs or organizations: Not on file    Relationship status: Not on file  Other Topics Concern  . Not on file  Social History Narrative   Lives alone in a one story home.  Has 2 children.  Retired.  Education: high school.     Additional Social History: She lives alone. She has 2 adult children. She denies alcohol or illicit substance use.     Allergies:   Allergies  Allergen Reactions  . Iodinated Diagnostic Agents Shortness Of Breath    Causes headaches, also  . Penicillins Other (See Comments)    Tolerated Zosyn Oct 2018- Patient is unaware this is an "allergy" Did it involve swelling of the face/tongue/throat, SOB, or low BP? Unk Did it involve sudden or severe rash/hives, skin  peeling, or any reaction on the inside of your mouth or nose? Unk Did you need to seek medical attention at a hospital or doctor's office? Unk When did it last happen? Unk If all above answers are "NO", may proceed with cephalosporin use.     Labs:  Results for orders placed or performed during the hospital encounter of 01/13/19 (from the past 48 hour(s))  Troponin I (High Sensitivity)     Status: Abnormal   Collection Time: 01/13/19  4:15 PM  Result Value Ref Range   Troponin I (High  Sensitivity) 19 (H) <18 ng/L    Comment: (NOTE) Elevated high sensitivity troponin I (hsTnI) values and significant  changes across serial measurements may suggest ACS but many other  chronic and acute conditions are known to elevate hsTnI results.  Refer to the "Links" section for chest pain algorithms and additional  guidance. Performed at [facility], [place] [address] [place], [place] [address]   CBC with Differential     Status: Abnormal   Collection Time: 01/13/19  4:15 PM  Result Value Ref Range   WBC 8.2 4.0 - 10.5 K/uL   RBC 4.91 3.87 - 5.11 MIL/uL   Hemoglobin 16.0 (H) 12.0 - 15.0 g/dL   HCT 45.0 36.0 - 46.0 %   MCV 91.6 80.0 - 100.0 fL   MCH 32.6 26.0 - 34.0 pg   MCHC 35.6 30.0 - 36.0 g/dL   RDW 13.2 11.5 - 15.5 %   Platelets 191 150 - 400 K/uL   nRBC 0.0 0.0 - 0.2 %   Neutrophils Relative % 62 %   Neutro Abs 5.1 1.7 - 7.7 K/uL   Lymphocytes Relative 25 %   Lymphs Abs 2.0 0.7 - 4.0 K/uL   Monocytes Relative 10 %   Monocytes Absolute 0.8 0.1 - 1.0 K/uL   Eosinophils Relative 2 %   Eosinophils Absolute 0.1 0.0 - 0.5 K/uL   Basophils Relative 1 %   Basophils Absolute 0.1 0.0 - 0.1 K/uL   Immature Granulocytes 0 %   Abs Immature Granulocytes 0.03 0.00 - 0.07 K/uL    Comment: Performed at [facility] [address], [place], [place] [address]  Comprehensive metabolic panel     Status: Abnormal   Collection Time: 01/13/19  4:15 PM  Result Value Ref Range   Sodium 139  135 - 145 mmol/L   Potassium 4.0 3.5 - 5.1 mmol/L   Chloride 107 98 - 111 mmol/L   CO2 19 (L) 22 - 32 mmol/L   Glucose, Bld 122 (H) 70 - 99 mg/dL   BUN 17 8 - 23 mg/dL   Creatinine, Ser 1.43 (H) 0.44 - 1.00 mg/dL   Calcium 10.2 8.9 - 10.3 mg/dL   Total Protein 7.0 6.5 - 8.1 g/dL   Albumin 4.0 3.5 - 5.0 g/dL   AST 25 15 - 41 U/L   ALT 16 0 - 44 U/L   Alkaline Phosphatase 68 38 - 126 U/L   Total Bilirubin 1.1 0.3 - 1.2 mg/dL   GFR calc non Af Amer 34 (L) >60 mL/min   GFR calc Af Amer 39 (L) >60 mL/min   Anion gap 13 5 - 15    Comment: Performed at [facility] [address] [place], [place] [address]  Protime-INR     Status: Abnormal   Collection Time: 01/13/19  4:15 PM  Result Value Ref Range   Prothrombin Time 28.1 (H) 11.4 - 15.2 seconds   INR 2.7 (H) 0.8 - 1.2    Comment: (NOTE) INR goal varies based on device and disease states. Performed at [facility], [place] [address] [place], [place] [address]   TSH     Status: Abnormal   Collection Time: 01/13/19  4:55 PM  Result Value Ref Range   TSH 5.280 (H) 0.350 - 4.500 uIU/mL    Comment: Performed by a 3rd Generation assay with a functional sensitivity of <=0.01 uIU/mL. Performed at [facility], [place] [address] [place], [place] [address]  SARS Coronavirus 2 [facility] order, Performed in [facility] hospital lab) Nasopharyngeal Nasopharyngeal Swab     Status: None   Collection Time: 01/13/19  5:27 PM   Specimen: Nasopharyngeal Swab  Result Value Ref Range   SARS Coronavirus 2 NEGATIVE NEGATIVE    Comment: (NOTE) If result is NEGATIVE SARS-CoV-2 target nucleic acids are NOT DETECTED. The SARS-CoV-2 RNA is generally detectable in upper and lower  respiratory specimens during the acute phase of infection. The lowest  concentration of SARS-CoV-2 viral copies this assay can detect is 250  copies / mL. A negative result does not preclude SARS-CoV-2 infection  and should not be used as the sole basis for  treatment or other  patient management decisions.  A negative result may occur with  improper specimen collection / handling, submission of specimen other  than nasopharyngeal swab, presence of viral mutation(s) within the  areas targeted by this assay, and inadequate number of viral copies  (<250 copies / mL). A negative result must be combined with clinical  observations, patient history, and epidemiological information. If result is POSITIVE SARS-CoV-2 target nucleic acids are DETECTED. The SARS-CoV-2 RNA is generally detectable in upper and lower  respiratory specimens dur ing the acute phase of infection.  Positive  results are indicative of active infection with SARS-CoV-2.  Clinical  correlation with patient history and other diagnostic information is  necessary to determine patient infection status.  Positive results do  not rule out bacterial infection or co-infection with other viruses. If result is PRESUMPTIVE POSTIVE SARS-CoV-2 nucleic acids MAY BE PRESENT.   A presumptive positive result was obtained on the submitted specimen  and confirmed on repeat testing.  While 2019 novel coronavirus  (SARS-CoV-2) nucleic acids may be present in the submitted sample  additional confirmatory testing may be necessary for epidemiological  and / or clinical management purposes  to differentiate between  SARS-CoV-2 and other Sarbecovirus currently known to infect humans.  If clinically indicated additional testing with an alternate test  methodology [phone]) is advised. The SARS-CoV-2 RNA is generally  detectable in upper and lower respiratory sp ecimens during the acute  phase of infection. The expected result is Negative. Fact Sheet for Patients:  StrictlyIdeas.no Fact Sheet for Healthcare Providers: BankingDealers.co.za This test is not yet approved or cleared by the [place] FDA and has been authorized for detection and/or  diagnosis of SARS-CoV-2 by FDA under an Emergency Use Authorization (EUA).  This EUA will remain in effect (meaning this test can be used) for the duration of the COVID-19 declaration under Section 564(b)(1) of the Act, 21 U.S.C. section 360bbb-3(b)(1), unless the authorization is terminated or revoked sooner. Performed at [facility], [place] [address], [place], [place] [address]   Troponin I (High Sensitivity)     Status: Abnormal   Collection Time: 01/13/19  6:20 PM  Result Value Ref Range   Troponin I (High Sensitivity) 19 (H) <18 ng/L    Comment: (NOTE) Elevated high sensitivity troponin I (hsTnI) values and significant  changes across serial measurements may suggest ACS but many other  chronic and acute conditions are known to elevate hsTnI results.  Refer to the "Links" section for chest pain algorithms and additional  guidance. Performed at [facility], [place] [address], [place], [place] [address]   Urinalysis, Routine w reflex microscopic     Status: Abnormal   Collection Time: 01/13/19  8:42 PM  Result Value Ref Range   Color, Urine YELLOW YELLOW  APPearance HAZY (A) CLEAR   Specific Gravity, Urine 1.020 1.005 - 1.030   pH 5.0 5.0 - 8.0   Glucose, UA NEGATIVE NEGATIVE mg/dL   Hgb urine dipstick SMALL (A) NEGATIVE   Bilirubin Urine NEGATIVE NEGATIVE   Ketones, ur 5 (A) NEGATIVE mg/dL   Protein, ur 30 (A) NEGATIVE mg/dL   Nitrite NEGATIVE NEGATIVE   Leukocytes,Ua MODERATE (A) NEGATIVE   RBC / HPF 0-5 0 - 5 RBC/hpf   WBC, UA 21-50 0 - 5 WBC/hpf   Bacteria, UA RARE (A) NONE SEEN   Squamous Epithelial / LPF 6-10 0 - 5   Mucus PRESENT    Hyaline Casts, UA PRESENT     Comment: Performed at [facility] [address], [place], [place] [address]  Urine culture     Status: Abnormal   Collection Time: 01/13/19 10:22 PM   Specimen: Urine, Clean Catch  Result Value Ref Range   Specimen Description URINE, CLEAN CATCH    Special Requests       NONE Performed at [facility], [place] [address] [place], [place] [address]    Culture (A)     40,000 COLONIES/mL MULTIPLE SPECIES PRESENT, SUGGEST RECOLLECTION   Report Status 01/15/2019 FINAL   Protime-INR     Status: Abnormal   Collection Time: 01/14/19  5:22 AM  Result Value Ref Range   Prothrombin Time 29.6 (H) 11.4 - 15.2 seconds   INR 2.9 (H) 0.8 - 1.2    Comment: (NOTE) INR goal varies based on device and disease states. Performed at [facility], [place] [address], [place], [place] [address]   Protime-INR     Status: Abnormal   Collection Time: 01/15/19  5:46 AM  Result Value Ref Range   Prothrombin Time 31.3 (H) 11.4 - 15.2 seconds   INR 3.1 (H) 0.8 - 1.2    Comment: (NOTE) INR goal varies based on device and disease states. Performed at [facility], [place] [address] [place], [place] [address]   Comprehensive metabolic panel     Status: Abnormal   Collection Time: 01/15/19  5:46 AM  Result Value Ref Range   Sodium 142 135 - 145 mmol/L   Potassium 3.4 (L) 3.5 - 5.1 mmol/L   Chloride 109 98 - 111 mmol/L   CO2 20 (L) 22 - 32 mmol/L   Glucose, Bld 110 (H) 70 - 99 mg/dL   BUN 13 8 - 23 mg/dL   Creatinine, Ser 1.16 (H) 0.44 - 1.00 mg/dL   Calcium 9.3 8.9 - 10.3 mg/dL   Total Protein 6.2 (L) 6.5 - 8.1 g/dL   Albumin 3.5 3.5 - 5.0 g/dL   AST 29 15 - 41 U/L   ALT 16 0 - 44 U/L   Alkaline Phosphatase 61 38 - 126 U/L   Total Bilirubin 2.0 (H) 0.3 - 1.2 mg/dL   GFR calc non Af Amer 43 (L) >60 mL/min   GFR calc Af Amer 50 (L) >60 mL/min   Anion gap 13 5 - 15    Comment: Performed at [facility], [place] [address] [place] [address]  CBC     Status: Abnormal   Collection Time: 01/15/19  5:46 AM  Result Value Ref Range   WBC 9.0 4.0 - 10.5 K/uL   RBC 4.54 3.87 - 5.11 MIL/uL   Hemoglobin 14.9 12.0 - 15.0 g/dL   HCT 40.7 36.0 - 46.0 %   MCV 89.6 80.0 -  100.0 fL   MCH 32.8 26.0 - 34.0 pg   MCHC 36.6 (H) 30.0 - 36.0 g/dL   RDW 13.0 11.5 -  15.5 %   Platelets 148 (L) 150 - 400 K/uL   nRBC 0.0 0.0 - 0.2 %    Comment: Performed at [facility] [address] [place], [place] [address]  Magnesium     Status: None   Collection Time: 01/15/19  5:46 AM  Result Value Ref Range   Magnesium 1.7 1.7 - 2.4 mg/dL    Comment: Performed at [facility] [address] [place], [place] [address]    Medications:  Current Facility-Administered Medications  Medication Dose Route Frequency Provider Last Rate Last Dose  . acetaminophen (TYLENOL) tablet 650 mg  650 mg Oral Q6H PRN Neena Rhymes, MD   650 mg at 01/14/19 0747   Or  . acetaminophen (TYLENOL) suppository 650 mg  650 mg Rectal Q6H PRN Norins, Heinz Knuckles, MD      . docusate sodium (COLACE) capsule 100 mg  100 mg Oral BID Neena Rhymes, MD   100 mg at 01/15/19 0939  . famotidine (PEPCID) IVPB 20 mg premix  20 mg Intravenous Daily Barb Merino, MD 100 mL/hr at 01/15/19 0939 20 mg at 01/15/19 0939  . fosfomycin (MONUROL) packet 3 g  3 g Oral Once Manuella Ghazi, Pratik D, DO      . haloperidol lactate (HALDOL) injection 2 mg  2 mg Intravenous Q6H PRN Manuella Ghazi, Pratik D, DO      . HYDROmorphone (DILAUDID) injection 0.5 mg  0.5 mg Intravenous Q3H PRN Manuella Ghazi, Pratik D, DO   0.5 mg at 01/15/19 0452  . lactated ringers infusion   Intravenous Continuous Heath Lark D, DO 100 mL/hr at 01/15/19 0943    . levothyroxine (SYNTHROID) tablet 88 mcg  88 mcg Oral QAC breakfast Neena Rhymes, MD   88 mcg at 01/14/19 0747  . metoprolol tartrate (LOPRESSOR) tablet 50 mg  50 mg Oral BID WC Norins, Heinz Knuckles, MD   50 mg at 01/15/19 0939  . ondansetron (ZOFRAN) injection 4 mg  4 mg Intravenous Q6H PRN Manuella Ghazi, Pratik D, DO   4 mg at 01/14/19 1756  . potassium chloride SA (K-DUR) CR tablet 40 mEq  40 mEq Oral BID Manuella Ghazi, Pratik D, DO      . vitamin B-12 (CYANOCOBALAMIN) tablet 1,000 mcg  1,000 mcg Oral Daily Norins, Heinz Knuckles, MD   1,000 mcg at 01/15/19 [phone]  . Warfarin - Pharmacist Dosing Inpatient    Does not apply q1800 Kris Mouton, [facility]        Musculoskeletal: Strength & Muscle Tone: Atrophy with age. Gait & Station: UTA since patient is lying in bed. Patient leans: N/A  Psychiatric Specialty Exam: Physical Exam  Nursing note and vitals reviewed. Constitutional: She is oriented to person, place, and time. She appears well-developed and well-nourished.  HENT:  Head: Normocephalic and atraumatic.  Neck: Normal range of motion.  Respiratory: Effort normal.  Musculoskeletal: Normal range of motion.  Neurological: She is alert and oriented to person, place, and time.  Psychiatric: She has a normal mood and affect. Her speech is normal and behavior is normal. Judgment and thought content normal. Cognition and memory are normal.    Review of Systems  Musculoskeletal: Positive for joint pain.  Psychiatric/Behavioral: Negative for hallucinations, substance abuse and suicidal ideas. The patient is nervous/anxious.   All other systems reviewed and are negative.  Blood pressure 118/62, pulse 89, temperature (!) 97.4 F (36.3 C), resp. rate 20, height 5\' 8"  (1.727 m), weight 90.8 kg, SpO2 98 %.Body mass index is 30.44 kg/m.  General Appearance: Fairly Groomed, elderly, Caucasian female, wearing a hospital gown who is lying in bed. NAD.   Eye Contact:  Good  Speech:  Clear and Coherent and Normal Rate  Volume:  Normal  Mood:  "Terrible"  Affect:  Dysphoric  Thought Process:  Linear and Descriptions of Associations: Intact  Orientation:  Full (Time, Place, and Person)  Thought Content:  Logical  Suicidal Thoughts:  No  Homicidal Thoughts:  No  Memory:  Immediate;   Fair Recent;   Fair Remote;   Fair  Judgement:  Fair  Insight:  Fair  Psychomotor Activity:  Normal  Concentration:  Concentration: Fair and Attention Span: Poor  Recall:  [employer] of Knowledge:  Fair  Language:  Good  Akathisia:  No  Handed:  Right  AIMS (if indicated):   N/A  Assets:  Communication  Skills Housing Resilience Social Support  ADL's:  Impaired  Cognition:  WNL  Sleep:   N/A   Assessment:  Charlotte Castro is 85 y.o. female who was admitted with atrial fibrillation. She presented with complaint of SOB and generalized weakness. Her hospital course has been complicated by AKI and UTI. She is oriented to person, place and time. She denies SI, HI or AVH. She does not appear to be responding to internal stimuli. Her presentation is likely secondary to delirium superimposed on dementia in the setting of hospitalization and medical condition. Recommend Seroquel as needed for hallucinations due to altered mental status.   Treatment Plan Summary: -Consider Seroquel 25 mg BID PRN for anxiety/hallucinations secondary to altered mental status.  -EKG reviewed and QTc 485 on 8/1. Please closely monitor when starting or increasing QTc prolonging agents.  -Psychiatry will sign off on patient at this time. Please consult psychiatry again as needed.   Disposition: No evidence of imminent risk to self or others at present.   Patient does not meet criteria for psychiatric inpatient admission.  This service was provided via telemedicine using a 2-way, interactive audio and video technology.  Names of all persons participating in this telemedicine service and their role in this encounter. Name: Buford Dresser, DO Role: Psychiatrist  Name: Charlotte Castro Role: Patient    Faythe Dingwall, DO 01/15/2019 1:30 PM

## 2019-01-15 NOTE — Progress Notes (Signed)
PROGRESS NOTE    Charlotte Castro  CXK:[id number] DOB: 20-Nov-1932 DOA: 01/13/2019 PCP: Isaac Bliss, Rayford Halsted, MD   Brief Narrative:  Per HPI: Charlotte Maenza Wilsonis a 85 y.o.femalewith medical history significant ofpermanent atrial fibrillation and hypothyroid disease. Reports she is been generally feeling weak for more than a year. She is informed all of her doctors that she does not have the energy she thinks she should have. August 1 the patient complained of increasing weakness along with shortness of breath and being very lightheaded. Because of these symptoms emergency department. She denied having any pain or any focal complaints.  She was evaluated at bedside earlier this morning with some mild nausea, but no other significant symptomatology noted.  Patient was admitted with some AKI, likely prerenal, from poor oral intake along with atrial fibrillation with RVR with marked improvement in the ED after 1 dose of IV metoprolol.  She is on warfarin for anticoagulation.  She is noted to have a periesophageal hiatal hernia on her chest x-ray.  Unfortunately, her symptoms began to worsen after she had lunch.  She began to have significant nausea and vomiting as well as abdominal pain for which Zofran and Dilaudid were given.  Rapid response was called on account of the fact that her blood pressures began to elevate significantly and she was becoming hypoxemic requiring 4 L nasal cannula.  I had called Dr. Sloan Leiter who had evaluated patient at bedside and noted a benign abdominal examination with improving blood pressures by the time of his evaluation.  It appears, that patient may have gagged on some of her food causing the symptoms.  She is overall now feeling better and will be given Protonix as well as GI cocktail, which should help with her symptoms  I had ordered KUB earlier which had resulted with no signs of bowel obstruction at this time.  Will need close monitoring through the  evening and will maintain on IV fluid with repeat labs in a.m.  PT evaluation also pending for generalized weakness and poor balance.  Repeat urine analysis with signs of pyuria, but no UTI identified.  8/3: Patient has had worsening confusion and hallucinations overnight.  She reports seeing bugs and pets in the room.  Patient does have some dementia and anxiety.  She appears to be this way also at home and has not had prior evaluation.  No abdominal pain, nausea or vomiting noted.  No significant heart rhythm abnormalities noted this morning and AKI appears stable with good urine output noted.  Assessment & Plan:   Active Problems:   Permanent atrial fibrillation   Essential hypertension, benign   Long term current use of anticoagulant therapy   Chronic diastolic CHF (congestive heart failure) (HCC)   Hypothyroidism   AKI (acute kidney injury) ([place])   Acute encephalopathy with hallucinations -Could be metabolically related versus psychiatric component -We will plan to treat AKI as well as UTI -Obtain psychiatric evaluation -Haldol as needed for agitation  Abdominal pain with nausea and vomiting-resolved -Appear to be an episode of gagging on food on 8/2 -Symptoms improved with GI cocktail -She would like to remain on clear liquid diet today and slowly advance later -Also noted to have periesophageal hernia which is chronic and may require further work-up in the outpatient setting  AKI-prerenal, nonoliguric-improving (baseline 0.8) -Continues to have elevated creatinine level -Increase IV fluid rate today -Continue monitoring I's and O's -Repeat renal panel in a.m. -Avoid nephrotoxic agents  Mild hypokalemia -Replete orally  and recheck in a.m. along with magnesium  Atrial fibrillation with RVR in the setting of permanent atrial fibrillation -Rate controlled -Continue home p.o. metoprolol -Warfarin for anticoagulation managed by pharmacy, INR currently 3.1  Hypertension  -Continue home regimen  Hypothyroidism -Continue Synthroid  Pyuria with likely UTI -Urine culture with multiple species -We will plan to give 1 dose of fosfomycin today  Generalized weakness -PT recommending SNF on discharge which is appropriate   DVT prophylaxis: Warfarin with therapeutic INR Code Status: Full Family Communication: Spoke with son on 8/2 Disposition Plan: Plan to discharge to SNF once stabilized.  Further evaluation for AKI ongoing.  Advance diet as tolerated.  Psychiatric evaluation ordered for hallucinations.  Treatment of UTI with fosfomycin as well.   Consultants:   Psychiatry  Procedures:   None  Antimicrobials:  Anti-infectives (From admission, onward)   None       Subjective: Patient seen and evaluated today with confusion and hallucinations noted overnight into today.  No further abdominal pain, nausea, or vomiting noted.  She has been seen by physical therapy with recommendations for SNF on discharge.  Objective: Vitals:   01/14/19 1935 01/15/19 0106 01/15/19 0424 01/15/19 0925  BP: (!) 167/93 (!) 157/57 129/89 118/62  Pulse: 67 69 93 89  Resp: 18 (!) 22 20   Temp: (!) 97 F (36.1 C)  (!) 97.4 F (36.3 C)   TempSrc:  Oral    SpO2: 100% 100% 100% 98%  Weight:   90.8 kg   Height:        Intake/Output Summary (Last 24 hours) at 01/15/2019 1206 Last data filed at 01/15/2019 0903 Gross per 24 hour  Intake 967.92 ml  Output 2025 ml  Net -1057.08 ml   Filed Weights   01/14/19 0242 01/15/19 0424  Weight: 91.5 kg 90.8 kg    Examination:  General exam: Appears calm and comfortable, confused Respiratory system: Clear to auscultation. Respiratory effort normal. Cardiovascular system: S1 & S2 heard, RRR. No JVD, murmurs, rubs, gallops or clicks. No pedal edema. Gastrointestinal system: Abdomen is nondistended, soft and nontender. No organomegaly or masses felt. Normal bowel sounds heard. Central nervous system: Alert and awake  Extremities: Symmetric 5 x 5 power. Skin: No rashes, lesions or ulcers Psychiatry: Cannot be assessed.    Data Reviewed: I have personally reviewed following labs and imaging studies  CBC: Recent Labs  Lab 01/13/19 1615 01/15/19 0546  WBC 8.2 9.0  NEUTROABS 5.1  --   HGB 16.0* 14.9  HCT 45.0 40.7  MCV 91.6 89.6  PLT 191 638*   Basic Metabolic Panel: Recent Labs  Lab 01/13/19 0958 01/13/19 1615 01/15/19 0546  NA 141 139 142  K 4.0 4.0 3.4*  CL 111 107 109  CO2 22 19* 20*  GLUCOSE 75 122* 110*  BUN 16 17 13   CREATININE 1.16* 1.43* 1.16*  CALCIUM 9.5 10.2 9.3  MG  --   --  1.7   GFR: Estimated Creatinine Clearance: 42.6 mL/min (A) (by C-G formula based on SCr of 1.16 mg/dL (H)). Liver Function Tests: Recent Labs  Lab 01/13/19 1615 01/15/19 0546  AST 25 29  ALT 16 16  ALKPHOS 68 61  BILITOT 1.1 2.0*  PROT 7.0 6.2*  ALBUMIN 4.0 3.5   No results for input(s): LIPASE, AMYLASE in the last 168 hours. No results for input(s): AMMONIA in the last 168 hours. Coagulation Profile: Recent Labs  Lab 01/13/19 1615 01/14/19 0522 01/15/19 0546  INR 2.7* 2.9* 3.1*  Cardiac Enzymes: No results for input(s): CKTOTAL, CKMB, CKMBINDEX, TROPONINI in the last 168 hours. BNP (last 3 results) No results for input(s): PROBNP in the last 8760 hours. HbA1C: No results for input(s): HGBA1C in the last 72 hours. CBG: No results for input(s): GLUCAP in the last 168 hours. Lipid Profile: No results for input(s): CHOL, HDL, LDLCALC, TRIG, CHOLHDL, LDLDIRECT in the last 72 hours. Thyroid Function Tests: Recent Labs    01/13/19 1655  TSH 5.280*   Anemia Panel: No results for input(s): VITAMINB12, FOLATE, FERRITIN, TIBC, IRON, RETICCTPCT in the last 72 hours. Sepsis Labs: No results for input(s): PROCALCITON, LATICACIDVEN in the last 168 hours.  Recent Results (from the past 240 hour(s))  SARS Coronavirus 2 [facility] order, Performed in [facility] hospital lab)  Nasopharyngeal Nasopharyngeal Swab     Status: None   Collection Time: 01/13/19  5:27 PM   Specimen: Nasopharyngeal Swab  Result Value Ref Range Status   SARS Coronavirus 2 NEGATIVE NEGATIVE Final    Comment: (NOTE) If result is NEGATIVE SARS-CoV-2 target nucleic acids are NOT DETECTED. The SARS-CoV-2 RNA is generally detectable in upper and lower  respiratory specimens during the acute phase of infection. The lowest  concentration of SARS-CoV-2 viral copies this assay can detect is 250  copies / mL. A negative result does not preclude SARS-CoV-2 infection  and should not be used as the sole basis for treatment or other  patient management decisions.  A negative result may occur with  improper specimen collection / handling, submission of specimen other  than nasopharyngeal swab, presence of viral mutation(s) within the  areas targeted by this assay, and inadequate number of viral copies  (<250 copies / mL). A negative result must be combined with clinical  observations, patient history, and epidemiological information. If result is POSITIVE SARS-CoV-2 target nucleic acids are DETECTED. The SARS-CoV-2 RNA is generally detectable in upper and lower  respiratory specimens dur ing the acute phase of infection.  Positive  results are indicative of active infection with SARS-CoV-2.  Clinical  correlation with patient history and other diagnostic information is  necessary to determine patient infection status.  Positive results do  not rule out bacterial infection or co-infection with other viruses. If result is PRESUMPTIVE POSTIVE SARS-CoV-2 nucleic acids MAY BE PRESENT.   A presumptive positive result was obtained on the submitted specimen  and confirmed on repeat testing.  While 2019 novel coronavirus  (SARS-CoV-2) nucleic acids may be present in the submitted sample  additional confirmatory testing may be necessary for epidemiological  and / or clinical management purposes  to  differentiate between  SARS-CoV-2 and other Sarbecovirus currently known to infect humans.  If clinically indicated additional testing with an alternate test  methodology [id number]) is advised. The SARS-CoV-2 RNA is generally  detectable in upper and lower respiratory sp ecimens during the acute  phase of infection. The expected result is Negative. Fact Sheet for Patients:  StrictlyIdeas.no Fact Sheet for Healthcare Providers: BankingDealers.co.za This test is not yet approved or cleared by the [place] FDA and has been authorized for detection and/or diagnosis of SARS-CoV-2 by FDA under an Emergency Use Authorization (EUA).  This EUA will remain in effect (meaning this test can be used) for the duration of the COVID-19 declaration under Section 564(b)(1) of the Act, 21 U.S.C. section 360bbb-3(b)(1), unless the authorization is terminated or revoked sooner. Performed at [facility], [place] [address], [place], [place] [address]   Urine culture  Status: Abnormal   Collection Time: 01/13/19 10:22 PM   Specimen: Urine, Clean Catch  Result Value Ref Range Status   Specimen Description URINE, CLEAN CATCH  Final   Special Requests   Final    NONE Performed at [facility], [place] [address] [place], [place] [address]    Culture (A)  Final    40,000 COLONIES/mL MULTIPLE SPECIES PRESENT, SUGGEST RECOLLECTION   Report Status 01/15/2019 FINAL  Final         Radiology Studies: Dg Chest 2 View  Result Date: 01/13/2019 CLINICAL DATA:  Shortness of breath and chest pain EXAM: CHEST - 2 VIEW COMPARISON:  June 10, 2017 chest radiograph and chest CT Oct 26, 2017 FINDINGS: There is no edema or consolidation. Heart size and pulmonary vascularity are normal. No adenopathy. There is a large paraesophageal hernia. No bone lesions. No pneumothorax. IMPRESSION: Large paraesophageal hernia. No edema or consolidation. Stable cardiac  silhouette. Electronically Signed   By: Lowella Grip III M.D.   On: 01/13/2019 16:42   Dg Abd 1 View  Result Date: 01/14/2019 CLINICAL DATA:  Nausea and vomiting EXAM: ABDOMEN - 1 VIEW COMPARISON:  None FINDINGS: No dilated loops of large or small bowel. No pathologic calcifications. No organomegaly. No aggressive osseous lesion. No intraperitoneal free air. Lower lumbar fusion. Degenerative narrowing of the hip joints. IMPRESSION: No bowel obstruction. Electronically Signed   By: Suzy Bouchard M.D.   On: 01/14/2019 14:37        Scheduled Meds: . docusate sodium  100 mg Oral BID  . levothyroxine  88 mcg Oral QAC breakfast  . metoprolol tartrate  50 mg Oral BID WC  . vitamin B-12  1,000 mcg Oral Daily  . Warfarin - Pharmacist Dosing Inpatient   Does not apply q1800   Continuous Infusions: . famotidine (PEPCID) IV 20 mg (01/15/19 0939)  . lactated ringers 100 mL/hr at 01/15/19 0943     LOS: 0 days    Time spent: 30 minutes    Sherilee Smotherman Darleen Crocker, DO [facility] Pager [phone]  If 7PM-7AM, please contact night-coverage [email or web address] Password [email or web address] 01/15/2019, 12:06 PM

## 2019-01-15 NOTE — Progress Notes (Signed)
Pt up and down from bed to chair, stating there are cats and insects are under the chair and in the corner. Gets agitated quickly when asked to sit down or the alarm goes off.

## 2019-01-15 NOTE — Progress Notes (Signed)
Occupational Therapy Treatment Patient Details Name: Charlotte Castro MRN: [id number] DOB: [birth year] Today's Date: 01/15/2019    History of present illness Pt is an 85 yo female s/p afib with RVR, acute kidney injury and SOB. Pt PMHx: anxiety, arthritis, endometrial CA, HTN, HLD, back sx, shoulder and wrist sx.   OT comments  Patient seated in recliner and agreeable to OT.  She uses quad cane for mobility and transfers with min guard for safety and balance (refusing RW).  She is able to follow commands given increased time and cueing, but is hyper-verbose and distracted throughout session, max cueing to redirect.  Poor safety awareness, min A at times when reaching to floor to clean up spilled water and throw away trash.  Will follow, updated dc plan to SNF as pt is not safe to DC home alone currently.    Follow Up Recommendations  SNF;Supervision/Assistance - 24 hour    Equipment Recommendations  None recommended by OT    Recommendations for Other Services      Precautions / Restrictions Precautions Precautions: Fall Restrictions Weight Bearing Restrictions: No       Mobility Bed Mobility               General bed mobility comments: OOB in chair   Transfers Overall transfer level: Needs assistance Equipment used: Quad cane Transfers: Sit to/from Stand Sit to Stand: Min guard         General transfer comment: min guard for safety and balance, poor safety awareness     Balance Overall balance assessment: Needs assistance Sitting-balance support: No upper extremity supported;Feet supported Sitting balance-[name]: Fair     Standing balance support: Single extremity supported;During functional activity Standing balance-[name]: Poor Standing balance comment: requires at least single UE support for all mobility.                            ADL either performed or assessed with clinical judgement   ADL Overall ADL's : Needs  assistance/impaired     Grooming: Min Dispensing optician: Min guard;Ambulation(quad cane)           Functional mobility during ADLs: Min guard(quad cane) General ADL Comments: pt limited by cognition, highly distracted and requires cueing to attend to tasks      Vision       Perception     Praxis      Cognition Arousal/Alertness: Awake/alert Behavior During Therapy: Restless;Anxious Overall Cognitive Status: Impaired/Different from baseline Area of Impairment: Following commands;Safety/judgement;Awareness;Problem solving;Memory;Attention                   Current Attention Level: Focused Memory: Decreased short-term memory;Decreased recall of precautions Following Commands: Follows one step commands inconsistently;Follows one step commands with increased time Safety/Judgement: Decreased awareness of safety;Decreased awareness of deficits Awareness: Intellectual Problem Solving: Difficulty sequencing;Requires verbal cues General Comments: pt anxious and eager to dc home, difficulty follow commands and poor safety awareness        Exercises     Shoulder Instructions       General Comments pt on RA with oxgyen saturations 95-100%    Pertinent Vitals/ Pain       Pain Assessment: No/denies pain  Home Living  Prior Functioning/Environment              Frequency  Min 2X/week        Progress Toward Goals  OT Goals(current goals can now be found in the care plan section)  Progress towards OT goals: Progressing toward goals  Acute Rehab OT Goals Patient Stated Goal: to get home  OT Goal Formulation: With patient Time For Goal Achievement: 01/28/19 Potential to Achieve Goals: Good  Plan Frequency remains appropriate;Discharge plan needs to be updated    Co-evaluation                 AM-PAC OT "6 Clicks" Daily Activity     Outcome  Measure   Help from another person eating meals?: None Help from another person taking care of personal grooming?: A Little Help from another person toileting, which includes using toliet, bedpan, or urinal?: A Little Help from another person bathing (including washing, rinsing, drying)?: A Little Help from another person to put on and taking off regular upper body clothing?: A Little Help from another person to put on and taking off regular lower body clothing?: A Little 6 Click Score: 19    End of Session Equipment Utilized During Treatment: Gait belt;Other (comment)(quad cane)  OT Visit Diagnosis: Unsteadiness on feet (R26.81);Muscle weakness (generalized) (M62.81)   Activity Tolerance Patient tolerated treatment well   Patient Left in chair;with call bell/phone within reach;with chair alarm set   Nurse Communication Mobility status;Other (comment)(cognition)        Time: 7096-4383 OT Time Calculation (min): 14 min  Charges: OT General Charges $OT Visit: 1 Visit OT Treatments $Self Care/Home Management : 8-22 mins  Delight Stare, OT Acute Rehabilitation Services Pager [phone] Office [phone]     Delight Stare 01/15/2019, 3:45 PM

## 2019-01-15 NOTE — Progress Notes (Signed)
Dr. Mariea Clonts on the phone with patient.

## 2019-01-15 NOTE — Progress Notes (Signed)
Pt hallucinations seeing bugs in the corner and also feeding a dog under the chair. Took medications this morning no issues.

## 2019-01-15 NOTE — Progress Notes (Signed)
Spoke with Daughter Tammie interested in [place] at discharge, once medically stable.

## 2019-01-15 NOTE — Progress Notes (Addendum)
MD, pt having some anxiety and confusion, pt has hx of Charlotte Castro, and gets like this when she goes to the hospital per pt's daughter,  pt may need case management for a facility at d/c, and pt may need some anxiety meds as well, will continue to monitor, Thanks Arvella Nigh RN.

## 2019-01-16 DIAGNOSIS — R410 Disorientation, unspecified: Secondary | ICD-10-CM

## 2019-01-16 LAB — BASIC METABOLIC PANEL
Anion gap: 7 (ref 5–15)
BUN: 8 mg/dL (ref 8–23)
CO2: 25 mmol/L (ref 22–32)
Calcium: 8.9 mg/dL (ref 8.9–10.3)
Chloride: 111 mmol/L (ref 98–111)
Creatinine, Ser: 0.93 mg/dL (ref 0.44–1.00)
GFR calc Af Amer: >60 mL/min (ref >60)
GFR calc non Af Amer: 56 mL/min — ABNORMAL LOW (ref >60)
Glucose, Bld: 87 mg/dL (ref 70–99)
Potassium: 4.5 mmol/L (ref 3.5–5.1)
Sodium: 143 mmol/L (ref 135–145)

## 2019-01-16 LAB — PROTIME-INR
INR: 3.5 — ABNORMAL HIGH (ref 0.8–1.2)
Prothrombin Time: 34.3 seconds — ABNORMAL HIGH (ref 11.4–15.2)

## 2019-01-16 NOTE — Progress Notes (Signed)
PROGRESS NOTE    Charlotte Castro  WCB:[id number] DOB: 23-Oct-1932 DOA: 01/13/2019 PCP: Isaac Bliss, Rayford Halsted, MD   Brief Narrative:  Per HPI: Charlotte Biel Wilsonis a 85 y.o.femalewith medical history significant ofpermanent atrial fibrillation and hypothyroid disease. Reports she is been generally feeling weak for more than a year. She is informed all of her doctors that she does not have the energy she thinks she should have. August 1 the patient complained of increasing weakness along with shortness of breath and being very lightheaded. Because of these symptoms emergency department. She denied having any pain or any focal complaints.  She was evaluated at bedside earlier this morning with some mild nausea, but no other significant symptomatology noted. Patient was admitted with some AKI,likely prerenal,from poor oral intake along with atrial fibrillation with RVR with marked improvement in the ED after 1 dose of IV metoprolol. She is on warfarin for anticoagulation. She is noted to have a periesophageal hiatal hernia on her chest x-ray.  Unfortunately, her symptoms began to worsen after she had lunch. She began to have significant nausea and vomiting as well as abdominal pain for which Zofran and Dilaudid were given. Rapid response was called on account of the fact that her blood pressures began to elevate significantly and she was becoming hypoxemic requiring 4 L nasal cannula. I had called Dr. Sloan Leiter who had evaluated patient at bedside and noted a benign abdominal examination with improving blood pressures by the time of his evaluation. It appears, that patient may have gagged on some of her food causing the symptoms. She is overall now feeling better and will be given Protonix as well as GI cocktail,which should help with her symptoms  I had ordered KUB earlier which had resulted with no signs of bowel obstruction at this time. Will need close monitoring through the  evening and will maintain on IV fluid with repeat labs in a.m. PT evaluation also pending for generalized weakness and poor balance. Repeat urine analysis with signs of pyuria, but no UTI identified.  8/3: Patient has had worsening confusion and hallucinations overnight.  She reports seeing bugs and pets in the room.  Patient does have some dementia and anxiety.  She appears to be this way also at home and has not had prior evaluation.  No abdominal pain, nausea or vomiting noted.  No significant heart rhythm abnormalities noted this morning and AKI appears stable with good urine output noted.   Assessment & Plan:   Principal Problem:   Delirium Active Problems:   Permanent atrial fibrillation   Essential hypertension, benign   Long term current use of anticoagulant therapy   Chronic diastolic CHF (congestive heart failure) (HCC)   Hypothyroidism   AKI (acute kidney injury) ([place])  Acute encephalopathy with hallucinations-improved -Appreciate psychiatry evaluation on 8/3 with likely delirium superimposed on dementia -We will plan to treat AKI as well as UTI -Seroquel ordered twice a day as needed for hallucinations -No need for further inpatient psychiatric evaluation otherwise noted  Abdominal pain with nausea and vomiting-resolved -Appear to be an episode of gagging on food on 8/2 -Symptoms improved with GI cocktail -Advance diet to soft -Also noted to have periesophageal hernia which is chronic and may require further work-up in the outpatient setting.  Will likely require endoscopy with GI outpatient to evaluate.  No need for this inpatient if she is tolerating soft diet.  AKI-prerenal, nonoliguric-improving (baseline 0.8) -Continues to have elevated creatinine level, but improved to 0.93 and likely  near baseline -Continue IV fluid and recheck labs in a.m. -Continue monitoring I's and O's -Avoid nephrotoxic agents  Mild hypokalemia-repleted -Recheck on labs in a.m.   Atrial fibrillation with RVR in the setting of permanent atrial fibrillation -Rate controlled -Continue home p.o. metoprolol -Warfarin for anticoagulation managed by pharmacy, INR currently 3.5 -Warfarin being held by pharmacy today  Hypertension -Continue home regimen  Hypothyroidism -Continue Synthroid  Pyuria with likely UTI -Urine culture with multiple species -Given 1 dose of fosfomycin on 8/3  Generalized weakness -PT recommending SNF on discharge which is appropriate -Should be medically stable for discharge by a.m.   DVT prophylaxis: Warfarin Code Status: Full Family Communication: Spoke with son on 8/2 Disposition Plan: Plan to discharge to SNF hopefully in a.m. if tolerating soft diet and no further hallucinations noted.   Consultants:   Psychiatry  Procedures:   None  Antimicrobials:  Anti-infectives (From admission, onward)   None       Subjective: Patient seen and evaluated today with no new acute complaints or concerns. No acute concerns or events noted overnight.  She has been tolerating her clear liquid diet and is hesitant to advance her diet, but if encouraged her to try and do so.  No significant hallucinations noted this morning and this was confirmed by nursing staff.  She does complain of a mild headache.  Objective: Vitals:   01/16/19 0403 01/16/19 0622 01/16/19 0743 01/16/19 1122  BP: (!) 168/73 (!) 151/79 (!) 160/80 (!) 158/87  Pulse: 79 79 71 72  Resp:    16  Temp: 98 F (36.7 C)  97.8 F (36.6 C) (!) 97.3 F (36.3 C)  TempSrc:   Oral Oral  SpO2: 95%  95% 100%  Weight:      Height:        Intake/Output Summary (Last 24 hours) at 01/16/2019 1258 Last data filed at 01/16/2019 1132 Gross per 24 hour  Intake 3304.91 ml  Output 2200 ml  Net 1104.91 ml   Filed Weights   01/14/19 0242 01/15/19 0424 01/16/19 0320  Weight: 91.5 kg 90.8 kg 89.9 kg    Examination:  General exam: Appears calm and comfortable  Respiratory  system: Clear to auscultation. Respiratory effort normal.  With nasal cannula oxygen. Cardiovascular system: S1 & S2 heard, RRR. No JVD, murmurs, rubs, gallops or clicks. No pedal edema. Gastrointestinal system: Abdomen is nondistended, soft and nontender. No organomegaly or masses felt. Normal bowel sounds heard. Central nervous system: Alert and oriented to person place and time. Extremities: Symmetric 5 x 5 power. Skin: No rashes, lesions or ulcers Psychiatry: Judgement and insight appear normal. Mood & affect appropriate.     Data Reviewed: I have personally reviewed following labs and imaging studies  CBC: Recent Labs  Lab 01/13/19 1615 01/15/19 0546  WBC 8.2 9.0  NEUTROABS 5.1  --   HGB 16.0* 14.9  HCT 45.0 40.7  MCV 91.6 89.6  PLT 191 161*   Basic Metabolic Panel: Recent Labs  Lab 01/13/19 0958 01/13/19 1615 01/15/19 0546 01/15/19 1249 01/16/19 0605  NA 141 139 142  --  143  K 4.0 4.0 3.4*  --  4.5  CL 111 107 109  --  111  CO2 22 19* 20*  --  25  GLUCOSE 75 122* 110*  --  87  BUN 16 17 13   --  8  CREATININE 1.16* 1.43* 1.16*  --  0.93  CALCIUM 9.5 10.2 9.3  --  8.9  MG  --   --  1.7 1.8  --    GFR: Estimated Creatinine Clearance: 52.8 mL/min (by C-G formula based on SCr of 0.93 mg/dL). Liver Function Tests: Recent Labs  Lab 01/13/19 1615 01/15/19 0546  AST 25 29  ALT 16 16  ALKPHOS 68 61  BILITOT 1.1 2.0*  PROT 7.0 6.2*  ALBUMIN 4.0 3.5   No results for input(s): LIPASE, AMYLASE in the last 168 hours. No results for input(s): AMMONIA in the last 168 hours. Coagulation Profile: Recent Labs  Lab 01/13/19 1615 01/14/19 0522 01/15/19 0546 01/16/19 0605  INR 2.7* 2.9* 3.1* 3.5*   Cardiac Enzymes: No results for input(s): CKTOTAL, CKMB, CKMBINDEX, TROPONINI in the last 168 hours. BNP (last 3 results) No results for input(s): PROBNP in the last 8760 hours. HbA1C: No results for input(s): HGBA1C in the last 72 hours. CBG: No results for  input(s): GLUCAP in the last 168 hours. Lipid Profile: No results for input(s): CHOL, HDL, LDLCALC, TRIG, CHOLHDL, LDLDIRECT in the last 72 hours. Thyroid Function Tests: Recent Labs    01/13/19 1655  TSH 5.280*   Anemia Panel: No results for input(s): VITAMINB12, FOLATE, FERRITIN, TIBC, IRON, RETICCTPCT in the last 72 hours. Sepsis Labs: No results for input(s): PROCALCITON, LATICACIDVEN in the last 168 hours.  Recent Results (from the past 240 hour(s))  SARS Coronavirus 2 [facility] order, Performed in [facility] hospital lab) Nasopharyngeal Nasopharyngeal Swab     Status: None   Collection Time: 01/13/19  5:27 PM   Specimen: Nasopharyngeal Swab  Result Value Ref Range Status   SARS Coronavirus 2 NEGATIVE NEGATIVE Final    Comment: (NOTE) If result is NEGATIVE SARS-CoV-2 target nucleic acids are NOT DETECTED. The SARS-CoV-2 RNA is generally detectable in upper and lower  respiratory specimens during the acute phase of infection. The lowest  concentration of SARS-CoV-2 viral copies this assay can detect is 250  copies / mL. A negative result does not preclude SARS-CoV-2 infection  and should not be used as the sole basis for treatment or other  patient management decisions.  A negative result may occur with  improper specimen collection / handling, submission of specimen other  than nasopharyngeal swab, presence of viral mutation(s) within the  areas targeted by this assay, and inadequate number of viral copies  (<250 copies / mL). A negative result must be combined with clinical  observations, patient history, and epidemiological information. If result is POSITIVE SARS-CoV-2 target nucleic acids are DETECTED. The SARS-CoV-2 RNA is generally detectable in upper and lower  respiratory specimens dur ing the acute phase of infection.  Positive  results are indicative of active infection with SARS-CoV-2.  Clinical  correlation with patient history and other diagnostic information  is  necessary to determine patient infection status.  Positive results do  not rule out bacterial infection or co-infection with other viruses. If result is PRESUMPTIVE POSTIVE SARS-CoV-2 nucleic acids MAY BE PRESENT.   A presumptive positive result was obtained on the submitted specimen  and confirmed on repeat testing.  While 2019 novel coronavirus  (SARS-CoV-2) nucleic acids may be present in the submitted sample  additional confirmatory testing may be necessary for epidemiological  and / or clinical management purposes  to differentiate between  SARS-CoV-2 and other Sarbecovirus currently known to infect humans.  If clinically indicated additional testing with an alternate test  methodology [phone]) is advised. The SARS-CoV-2 RNA is generally  detectable in upper and lower respiratory sp ecimens during the acute  phase of infection. The expected result  is Negative. Fact Sheet for Patients:  StrictlyIdeas.no Fact Sheet for Healthcare Providers: BankingDealers.co.za This test is not yet approved or cleared by the [place] FDA and has been authorized for detection and/or diagnosis of SARS-CoV-2 by FDA under an Emergency Use Authorization (EUA).  This EUA will remain in effect (meaning this test can be used) for the duration of the COVID-19 declaration under Section 564(b)(1) of the Act, 21 U.S.C. section 360bbb-3(b)(1), unless the authorization is terminated or revoked sooner. Performed at [facility], [place] [address], [place], [place] [address]   Urine culture     Status: Abnormal   Collection Time: 01/13/19 10:22 PM   Specimen: Urine, Clean Catch  Result Value Ref Range Status   Specimen Description URINE, CLEAN CATCH  Final   Special Requests   Final    NONE Performed at [facility], [place] [address] [place], [place] [address]    Culture (A)  Final    40,000 COLONIES/mL MULTIPLE SPECIES PRESENT, SUGGEST  RECOLLECTION   Report Status 01/15/2019 FINAL  Final         Radiology Studies: Dg Abd 1 View  Result Date: 01/14/2019 CLINICAL DATA:  Nausea and vomiting EXAM: ABDOMEN - 1 VIEW COMPARISON:  None FINDINGS: No dilated loops of large or small bowel. No pathologic calcifications. No organomegaly. No aggressive osseous lesion. No intraperitoneal free air. Lower lumbar fusion. Degenerative narrowing of the hip joints. IMPRESSION: No bowel obstruction. Electronically Signed   By: Suzy Bouchard M.D.   On: 01/14/2019 14:37        Scheduled Meds: . docusate sodium  100 mg Oral BID  . levothyroxine  88 mcg Oral QAC breakfast  . metoprolol tartrate  50 mg Oral BID WC  . vitamin B-12  1,000 mcg Oral Daily  . Warfarin - Pharmacist Dosing Inpatient   Does not apply q1800   Continuous Infusions: . famotidine (PEPCID) IV 20 mg (01/16/19 0803)  . lactated ringers 100 mL/hr at 01/16/19 1039     LOS: 1 day    Time spent: 30 minutes     Darleen Crocker, DO [facility] Pager [phone]  If 7PM-7AM, please contact night-coverage [email or web address] Password [email or web address] 01/16/2019, 12:58 PM

## 2019-01-16 NOTE — Progress Notes (Signed)
Physical Therapy Treatment Patient Details Name: Charlotte Castro MRN: [id number] DOB: [birth year] Today's Date: 01/16/2019    History of Present Illness Pt is an 85 yo female s/p afib with RVR, acute kidney injury and SOB. Pt PMHx: anxiety, arthritis, endometrial CA, HTN, HLD, back sx, shoulder and wrist sx.    PT Comments    Pt in darkened room on entry, but agreeable to therapy. Pt is limited in safe mobility by decreased strength, balance and endurance. Pt requires supervision for bed mobility, and min guard for transfers and ambulation of 20 feet with quad cane. Pt unable to progress ambulation due to fatigue. Given limited mobility and living alone PT continues to recommend SNF level rehab at discharge, hopefully tomorrow.    Follow Up Recommendations  SNF     Equipment Recommendations  None recommended by PT       Precautions / Restrictions Precautions Precautions: Fall Restrictions Weight Bearing Restrictions: No    Mobility  Bed Mobility Overal bed mobility: Needs Assistance Bed Mobility: Supine to Sit     Supine to sit: Supervision     General bed mobility comments: increased time and effort to come to EoB  Transfers Overall transfer level: Needs assistance Equipment used: Quad cane Transfers: Sit to/from Stand Sit to Stand: Min guard         General transfer comment: min guard for safety with power up and able to self stabilize prior to reaching to cane  Ambulation/Gait Ambulation/Gait assistance: Min guard Gait Distance (Feet): 20 Feet Assistive device: Quad cane Gait Pattern/deviations: Step-through pattern;Decreased step length - right;Decreased step length - left;Shuffle;Narrow base of support     General Gait Details: min guard for ambulation with cane, pt with mildly unsteady gait, no over LoB, vc for increased BoS, pt fatigues quickly and request to return to chair         Balance Overall balance assessment: Needs assistance Sitting-balance  support: No upper extremity supported;Feet supported Sitting balance-[name]: Fair     Standing balance support: Single extremity supported;During functional activity Standing balance-[name]: Poor Standing balance comment: requires at least single UE support for all mobility.                             Cognition Arousal/Alertness: Awake/alert Behavior During Therapy: Restless;Anxious Overall Cognitive Status: Impaired/Different from baseline Area of Impairment: Following commands;Safety/judgement;Awareness;Problem solving;Memory;Attention                   Current Attention Level: Focused Memory: Decreased short-term memory;Decreased recall of precautions Following Commands: Follows one step commands inconsistently;Follows one step commands with increased time Safety/Judgement: Decreased awareness of safety;Decreased awareness of deficits Awareness: Intellectual Problem Solving: Difficulty sequencing;Requires verbal cues General Comments: pt anxious and eager to dc home, difficulty follow commands and poor safety awareness         General Comments General comments (skin integrity, edema, etc.): Pt on 2L O2 via [place] on entry, removed for ambulation and pt able to maintain SaO2 >92%O2 throughout session      Pertinent Vitals/Pain Pain Assessment: No/denies pain           PT Goals (current goals can now be found in the care plan section) Acute Rehab PT Goals Patient Stated Goal: to get home  PT Goal Formulation: With patient Time For Goal Achievement: 01/28/19 Potential to Achieve Goals: Fair    Frequency    Min 3X/week  PT Plan Current plan remains appropriate       AM-PAC PT "6 Clicks" Mobility   Outcome Measure  Help needed turning from your back to your side while in a flat bed without using bedrails?: None Help needed moving from lying on your back to sitting on the side of a flat bed without using bedrails?: None Help needed  moving to and from a bed to a chair (including a wheelchair)?: A Little Help needed standing up from a chair using your arms (e.g., wheelchair or bedside chair)?: A Little Help needed to walk in hospital room?: A Lot Help needed climbing 3-5 steps with a railing? : Total 6 Click Score: 17    End of Session Equipment Utilized During Treatment: Gait belt Activity Tolerance: Patient limited by fatigue Patient left: in chair;with call bell/phone within reach;with chair alarm set Nurse Communication: Mobility status PT Visit Diagnosis: Unsteadiness on feet (R26.81);Other abnormalities of gait and mobility (R26.89);Muscle weakness (generalized) (M62.81);Difficulty in walking, not elsewhere classified (R26.2)     Time: 4403-4742 PT Time Calculation (min) (ACUTE ONLY): 14 min  Charges:  $Gait Training: 8-22 mins                     Ariannah Arenson B. Migdalia Dk PT, DPT Acute Rehabilitation Services Pager [phone] Office [phone]    [place] 01/16/2019, 5:02 PM

## 2019-01-16 NOTE — Discharge Instructions (Signed)

## 2019-01-16 NOTE — Progress Notes (Signed)
ANTICOAGULATION CONSULT NOTE - Follow Up Consult  Pharmacy Consult for Warfarin Indication: atrial fibrillation  Allergies  Allergen Reactions  . Iodinated Diagnostic Agents Shortness Of Breath    Causes headaches, also  . Penicillins Other (See Comments)    Tolerated Zosyn Oct 2018- Patient is unaware this is an "allergy" Did it involve swelling of the face/tongue/throat, SOB, or low BP? Unk Did it involve sudden or severe rash/hives, skin peeling, or any reaction on the inside of your mouth or nose? Unk Did you need to seek medical attention at a hospital or doctor's office? Unk When did it last happen? Unk If all above answers are "NO", may proceed with cephalosporin use.     Patient Measurements: Height: 5\' 8"  (172.7 cm) Weight: 198 lb 3.1 oz (89.9 kg)(scale b) IBW/kg (Calculated) : 63.9  Vital Signs: Temp: 97.8 F (36.6 C) (08/04 0743) Temp Source: Oral (08/04 0743) BP: 160/80 (08/04 0743) Pulse Rate: 71 (08/04 0743)  Labs: Recent Labs    01/13/19 1615 01/13/19 1820 01/14/19 0522 01/15/19 0546 01/16/19 0605  HGB 16.0*  --   --  14.9  --   HCT 45.0  --   --  40.7  --   PLT 191  --   --  148*  --   LABPROT 28.1*  --  29.6* 31.3* 34.3*  INR 2.7*  --  2.9* 3.1* 3.5*  CREATININE 1.43*  --   --  1.16* 0.93  TROPONINIHS 19* 19*  --   --   --     Estimated Creatinine Clearance: 52.8 mL/min (by C-G formula based on SCr of 0.93 mg/dL).  Assessment:  86 yr old female on warfarin PTA for atrial fibrillation.  INR 2.7 on admit 8/1.  INR trended up to 3.1 on 8/3, just above therapeutic range, after usual dose of 2.5 mg given on 8/2.  Reduced dose to 2.5 mg on 8/3 > INR up to 3.5 today.    PTA regimen:  5 mg daily except 2.5 mg on Sundays.  Goal of Therapy:  INR 2-3 Monitor platelets by anticoagulation protocol: Yes   Plan:   Hold Warfarin today.  Daily PT/INR.  Charlotte Castro, [place] Pager: [phone] or phone: [phone] 01/16/2019,9:48 AM

## 2019-01-17 DIAGNOSIS — Z7901 Long term (current) use of anticoagulants: Secondary | ICD-10-CM

## 2019-01-17 DIAGNOSIS — I5032 Chronic diastolic (congestive) heart failure: Secondary | ICD-10-CM

## 2019-01-17 DIAGNOSIS — R209 Unspecified disturbances of skin sensation: Secondary | ICD-10-CM

## 2019-01-17 LAB — BASIC METABOLIC PANEL
Anion gap: 9 (ref 5–15)
BUN: 7 mg/dL — ABNORMAL LOW (ref 8–23)
CO2: 22 mmol/L (ref 22–32)
Calcium: 8.9 mg/dL (ref 8.9–10.3)
Chloride: 111 mmol/L (ref 98–111)
Creatinine, Ser: 0.93 mg/dL (ref 0.44–1.00)
GFR calc Af Amer: >60 mL/min (ref >60)
GFR calc non Af Amer: 56 mL/min — ABNORMAL LOW (ref >60)
Glucose, Bld: 96 mg/dL (ref 70–99)
Potassium: 3.9 mmol/L (ref 3.5–5.1)
Sodium: 142 mmol/L (ref 135–145)

## 2019-01-17 LAB — CBC
HCT: 38.7 % (ref 36.0–46.0)
Hemoglobin: 13.8 g/dL (ref 12.0–15.0)
MCH: 32.9 pg (ref 26.0–34.0)
MCHC: 35.7 g/dL (ref 30.0–36.0)
MCV: 92.1 fL (ref 80.0–100.0)
Platelets: 138 10*3/uL — ABNORMAL LOW (ref 150–400)
RBC: 4.2 MIL/uL (ref 3.87–5.11)
RDW: 13.4 % (ref 11.5–15.5)
WBC: 5.1 10*3/uL (ref 4.0–10.5)
nRBC: 0 % (ref 0.0–0.2)

## 2019-01-17 LAB — PROTIME-INR
INR: 3.5 — ABNORMAL HIGH (ref 0.8–1.2)
Prothrombin Time: 34.2 seconds — ABNORMAL HIGH (ref 11.4–15.2)

## 2019-01-17 IMAGING — CR ABDOMEN - 1 VIEW
2 series · 2 of 2 positions shown · non-contrast
Comparison: [DATE]

CLINICAL DATA: Abdominal pain

EXAM:
ABDOMEN - 1 VIEW

[abdomen kub (1 of 2)]
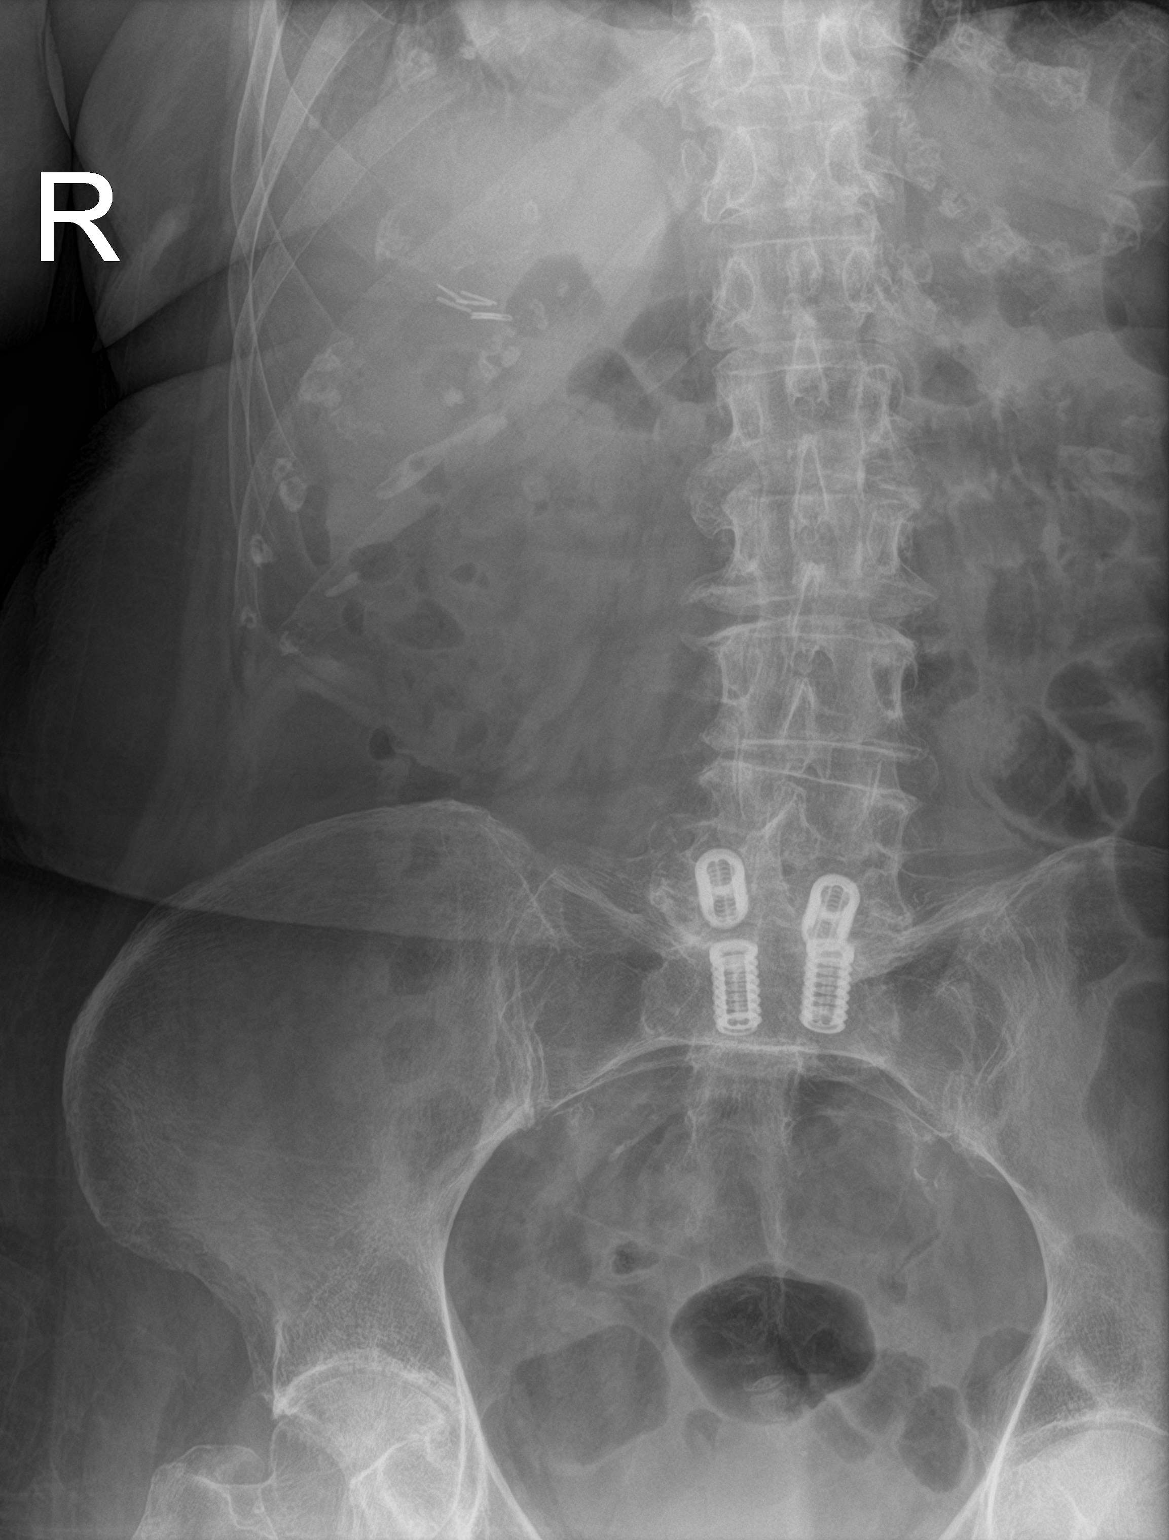

[abdomen kub (2 of 2)]
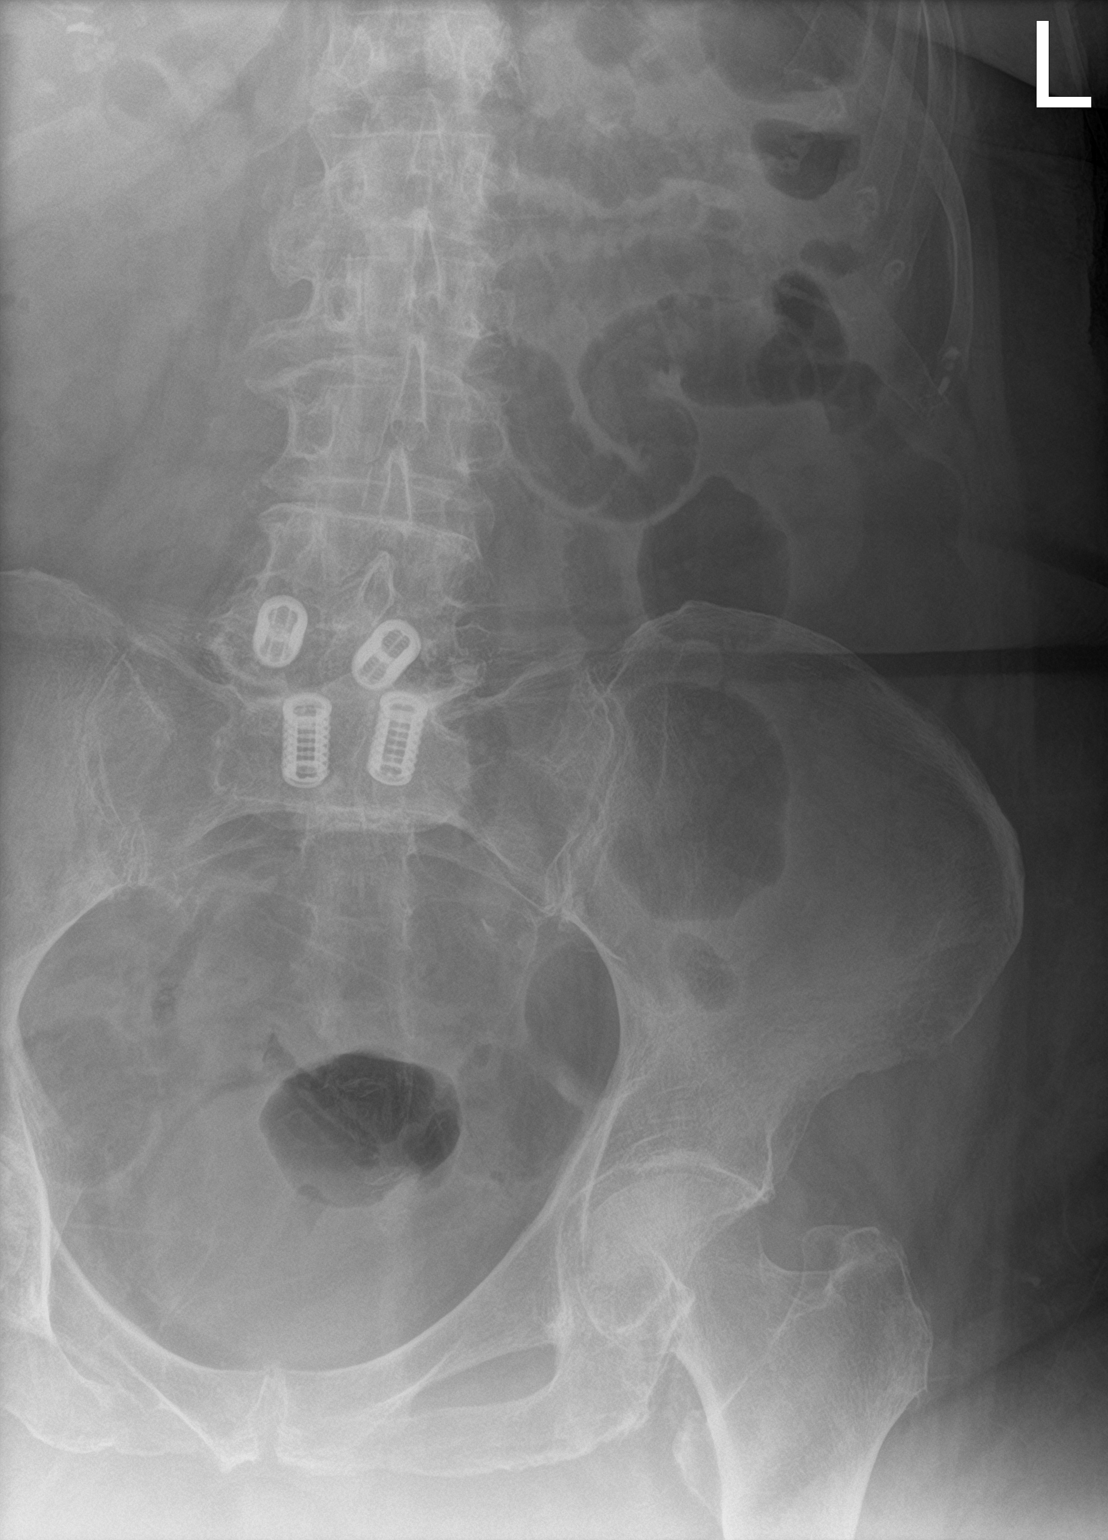

[2 of 2 positions shown; findings below may reference images not displayed]

FINDINGS: Osseous demineralization with prior Ray cage fusion of L4-L5 and
L5-S1.

Surgical clips RIGHT upper quadrant question cholecystectomy.

Normal bowel gas pattern without bowel dilatation or bowel wall
thickening.

No definite urinary tract calcification.
IMPRESSION: Normal bowel gas pattern.

## 2019-01-17 MED ORDER — FAMOTIDINE 20 MG PO TABS
20.0000 mg | ORAL_TABLET | Freq: Every day | ORAL | Status: DC
  Administered 2019-01-18 – 2019-01-19 (×2): 20 mg via ORAL
  Filled 2019-01-17 (×2): qty 1

## 2019-01-17 MED ORDER — WARFARIN 0.5 MG HALF TABLET
0.5000 mg | ORAL_TABLET | Freq: Once | ORAL | Status: AC
  Administered 2019-01-17: 0.5 mg via ORAL
  Filled 2019-01-17: qty 1

## 2019-01-17 NOTE — Plan of Care (Signed)

## 2019-01-17 NOTE — TOC Progression Note (Addendum)
Transition of Care [facility]) - Progression Note    Patient Details  Name: Charlotte Castro MRN: [id number] Date of Birth: 07-23-1932  Transition of [facility]) CM/SW [place], [place] Phone Number: 01/17/2019, 3:02 PM  Clinical Narrative:    CSW spoke with patient's daughter concerning SNF placement.  The facility which patient's daughter chose will not be able to take patient due  To care needs exceeding facility capability.Patient's daughter has given permission for CSW to seek other facilities in the areas.  CSW will continue to follow patient for disposition..    Expected Discharge Plan: Skilled Nursing Facility Barriers to Discharge: Continued Medical Work up  Expected Discharge Plan and Services Expected Discharge Plan: [place] In-house Referral: NA   Post Acute Care Choice: NA Living arrangements for the past 2 months: Single Family Home Expected Discharge Date: 01/16/19                         [facility] Arranged: NA           Social Determinants of Health (SDOH) Interventions    Readmission Risk Interventions No flowsheet data found.

## 2019-01-17 NOTE — Progress Notes (Signed)
PROGRESS NOTE  Charlotte Castro:[id number] DOB: 12-13-1932 DOA: 01/13/2019 PCP: Isaac Bliss, Rayford Halsted, MD  Brief History   Charlotte Eldredge Wilsonis a 85 y.o.femalewith medical history significant ofpermanent atrial fibrillation and hypothyroid disease. Reports she is been generally feeling weak for more than a year. She is informed all of her doctors that she does not have the energy she thinks she should have. August 1 the patient complained of increasing weakness along with shortness of breath and being very lightheaded. Because of these symptoms emergency department. She denied having any pain or any focal complaints.  She was evaluated at bedside earlier this morning with some mild nausea, but no other significant symptomatology noted. Patient was admitted with some AKI,likely prerenal,from poor oral intake along with atrial fibrillation with RVR with marked improvement in the ED after 1 dose of IV metoprolol. She is on warfarin for anticoagulation. She is noted to have a periesophageal hiatal hernia on her chest x-ray.  Unfortunately, her symptoms began to worsen after she had lunch. She began to have significant nausea and vomiting as well as abdominal pain for which Zofran and Dilaudid were given. Rapid response was called on account of the fact that her blood pressures began to elevate significantly and she was becoming hypoxemic requiring 4 L nasal cannula. I had called Dr. Sloan Leiter who had evaluated patient at bedside and noted a benign abdominal examination with improving blood pressures by the time of his evaluation. It appears, that patient may have gagged on some of her food causing the symptoms. She is overall now feeling better and will be given Protonix as well as GI cocktail,which should help with her symptoms  I had ordered KUB earlier which had resulted with no signs of bowel obstruction at this time. Will need close monitoring through the evening and will maintain  on IV fluid with repeat labs in a.m. PT evaluation also pending for generalized weakness and poor balance. Repeat urine analysis with signs of pyuria, but no UTI identified.  The patient has had some difficulty with hallucinations. It is thought that is due to dementia and anxiety. Psychiatry was consulted and they have recommended Seroquel 25 mg bid prn.  The patient is complaining of bilateral lower extremity weakness.   Consultants  . Psychiatry  Procedures  . None  Antibiotics   Anti-infectives (From admission, onward)   None    .  Marland Kitchen   Subjective  The patient is resting comfortably. No new complaints.  Objective   Vitals:  Vitals:   01/17/19 1133 01/17/19 1808  BP: (!) 161/84 (!) 175/116  Pulse: 75   Resp:    Temp:    SpO2: 98%     Exam:  Constitutional:  . The patient is awake and alert. She is in no acute distress.  Respiratory:  . No increased work of breathing.  . No wheezes, rales, or rhonchi. . No tactile fremitus. Cardiovascular:  . Regular, rate and rhythm. . No murmurs, ectopy, or gallups. . No lateral PMI. No thrills./ Abdomen:  . Abdomen is soft, non-tender, non-distended. . No hernias, masses, or organomegaly. . Normoactive bowel sounds.  Musculoskeletal:  . No cyanosis, clubbing, or edema. Skin:  . No rashes, lesions, ulcers . palpation of skin: no induration or nodules Neurologic:  . CN 2-12 intact . Sensation all 4 extremities intact . Pt complains of pain and weakness in legs bilaterally.  Psychiatric:  . Mental status o Mood, affect appropriate o Orientation to person, place, time  .  judgment and insight appear intact    I have personally reviewed the following:   Today's Data  . Vitals, CBC, BMP  Scheduled Meds: . docusate sodium  100 mg Oral BID  . [START ON 01/18/2019] famotidine  20 mg Oral Daily  . levothyroxine  88 mcg Oral QAC breakfast  . metoprolol tartrate  50 mg Oral BID WC  . vitamin B-12  1,000 mcg Oral  Daily  . Warfarin - Pharmacist Dosing Inpatient   Does not apply q1800   Continuous Infusions: . lactated ringers 100 mL/hr at 01/17/19 1130    Principal Problem:   Delirium Active Problems:   Permanent atrial fibrillation   Essential hypertension, benign   Long term current use of anticoagulant therapy   Chronic diastolic CHF (congestive heart failure) (HCC)   Hypothyroidism   AKI (acute kidney injury) ([place])   LOS: 2 days   A & P  Acute encephalopathy with hallucinations-improved. Appreciate psychiatry evaluation on 8/3 with likely delirium superimposed on dementia. We will plan to treat AKI as well as UTI. Seroquel ordered twice a day as needed for hallucinations. No need for further inpatient psychiatric evaluation otherwise noted.  Abdominal pain with nausea and vomiting: Resolved with GI cocktail following an episode of difficulty with gagging. She is receiving a soft diet.  Also noted to have periesophageal hernia which is chronic and may require further work-up in the outpatient setting.  Will likely require endoscopy with GI outpatient to evaluate.  No need for this inpatient if she is tolerating soft diet.  AKI: prerenal, nonoliguric-improving (baseline 0.8). Continues to have elevated creatinine level, but improved to 0.93 and likely near baseline. Continue IV fluid and recheck labs in a.m. Continue monitoring I's and O's. Avoid nephrotoxic agents.  Mild hypokalemia: Supplement and Monitor.  Bilateral Lower extremity weakness: Pt complains of pain and weakness in both legs sometimes with numbness. Will check a CT lumbar spine. She denies difficulty with continence of bowel and bladder.  Atrial fibrillation with RVR in the setting of permanent atrial fibrillation: Rate controlled on home p.o. metoprolol. The patient is on  Warfarin for anticoagulation managed by pharmacy,INR currently 3.5. Warfarin being held by pharmacy again today.  Hypertension: Blood pressures are  well controlled on metoprololContinue home regimen  Hypothyroidism: Continue Synthroid  Pyuria with likely UTI: Urine culture with multiple species. Given 1 dose of fosfomycin on 8/3.  Generalized weakness: PT recommending SNF on discharge which is appropriate.  DVT prophylaxis: Warfarin Code Status: Full Code Family Communication: None available Disposition Plan: SNF   Can Lucci, DO [facility] Direct contact: see [email or web address]  7PM-7AM contact night coverage as above 01/17/2019, 7:18 PM  LOS: 2 days

## 2019-01-17 NOTE — Progress Notes (Signed)
CSW acknowledges consult for SNF.  CSW consulted with patient's daughter to seek other SNF facilities for pt.  CSW will continue to follow for disposition.  Reed Breech LCSWA [phone]

## 2019-01-17 NOTE — Progress Notes (Signed)
ANTICOAGULATION CONSULT NOTE  Pharmacy Consult:  Coumadin Indication: atrial fibrillation  Allergies  Allergen Reactions  . Iodinated Diagnostic Agents Shortness Of Breath    Causes headaches, also  . Penicillins Other (See Comments)    Tolerated Zosyn Oct 2018- Patient is unaware this is an "allergy" Did it involve swelling of the face/tongue/throat, SOB, or low BP? Unk Did it involve sudden or severe rash/hives, skin peeling, or any reaction on the inside of your mouth or nose? Unk Did you need to seek medical attention at a hospital or doctor's office? Unk When did it last happen? Unk If all above answers are "NO", may proceed with cephalosporin use.     Patient Measurements: Height: 5\' 8"  (172.7 cm) Weight: 206 lb 1.6 oz (93.5 kg) IBW/kg (Calculated) : 63.9  Vital Signs: Temp: 97.4 F (36.3 C) (08/05 0435) Temp Source: Oral (08/05 0435) BP: 171/97 (08/05 0435) Pulse Rate: 73 (08/05 0435)  Labs: Recent Labs    01/15/19 0546 01/16/19 0605 01/17/19 0504  HGB 14.9  --  13.8  HCT 40.7  --  38.7  PLT 148*  --  138*  LABPROT 31.3* 34.3* 34.2*  INR 3.1* 3.5* 3.5*  CREATININE 1.16* 0.93 0.93    Estimated Creatinine Clearance: 53.8 mL/min (by C-G formula based on SCr of 0.93 mg/dL).  Assessment: 85 yr old female on Coumadin PTA for atrial fibrillation.  INR supra-therapeutic but remains unchanged since Coumadin dose was held yesterday.  Anticipate INR coming down tomorrow.  No bleeding reported.  PTA regimen:  5 mg daily except 2.5 mg on Sundays.  Goal of Therapy:  INR 2-3 Monitor platelets by anticoagulation protocol: Yes   Plan:  Coumadin 0.5mg  PO today Daily PT / INR Consider adding PRN BP med   Adelae Yodice D. Mina Marble, PharmD, BCPS, [place] 01/17/2019, 8:08 AM

## 2019-01-18 LAB — PROTIME-INR
INR: 2.2 — ABNORMAL HIGH (ref 0.8–1.2)
Prothrombin Time: 23.7 seconds — ABNORMAL HIGH (ref 11.4–15.2)

## 2019-01-18 LAB — SARS CORONAVIRUS 2 (TAT 6-24 HRS): SARS Coronavirus 2: NEGATIVE

## 2019-01-18 MED ORDER — QUETIAPINE FUMARATE 25 MG PO TABS: 25.0000 mg | ORAL_TABLET | Freq: Two times a day (BID) | ORAL | 0 refills | Status: DC | PRN

## 2019-01-18 MED ORDER — WARFARIN SODIUM 1 MG PO TABS
1.0000 mg | ORAL_TABLET | Freq: Once | ORAL | Status: AC
  Administered 2019-01-18: 1 mg via ORAL
  Filled 2019-01-18: qty 1

## 2019-01-18 NOTE — TOC Progression Note (Signed)
Transition of Care [facility]) - Progression Note    Patient Details  Name: Charlotte Castro MRN: [id number] Date of Birth: 10/24/1932  Transition of Care [facility]) CM/SW [place], [place] Phone Number: 01/18/2019, 4:26 PM  Clinical Narrative:     CSW spoke with the patient's daughter, Lynelle Smoke- she accepted bed offer with  [facility] [place] called and confirmed placement with SNF  Patient must have  Covid test and receive results 24-48 hrs prior to discharge.   Thurmond Butts, MSW, [facility] Clinical Social Worker [phone]   Expected Discharge Plan: Skilled Nursing Facility Barriers to Discharge: Continued Medical Work up  Expected Discharge Plan and Services Expected Discharge Plan: [place] In-house Referral: NA   Post Acute Care Choice: NA Living arrangements for the past 2 months: Single Family Home Expected Discharge Date: 01/18/19                         [facility] Arranged: NA           Social Determinants of Health (SDOH) Interventions    Readmission Risk Interventions No flowsheet data found.

## 2019-01-18 NOTE — Progress Notes (Signed)
Occupational Therapy Treatment Patient Details Name: Charlotte Castro MRN: [id number] DOB: 1933-01-17 Today's Date: 01/18/2019    History of present illness Pt is an 85 yo female s/p afib with RVR, acute kidney injury and SOB. Pt PMHx: anxiety, arthritis, endometrial CA, HTN, HLD, back sx, shoulder and wrist sx.   OT comments  Pt found by RN staff walking around room trying to take out IV line. Pt agreeable (distracted by) sink level grooming with moderate verbal cues for task completion. Pt able to complete transfers at min guard assist - cues for safety with RW. Pt will benefit from continued skilled OT acutely and afterwards at SNF level.    Follow Up Recommendations  SNF;Supervision/Assistance - 24 hour    Equipment Recommendations  None recommended by OT    Recommendations for Other Services      Precautions / Restrictions Precautions Precautions: Fall Restrictions Weight Bearing Restrictions: No       Mobility Bed Mobility Overal bed mobility: Needs Assistance Bed Mobility: Supine to Sit     Supine to sit: Supervision Sit to supine: Supervision   General bed mobility comments: supervision for safety - able to complete without assist, extra time required  Transfers Overall transfer level: Needs assistance Equipment used: Rolling walker (2 wheeled) Transfers: Sit to/from Stand Sit to Stand: Min guard;Min assist Stand pivot transfers: Min guard       General transfer comment: min guard assist for boost and balance    Balance Overall balance assessment: Needs assistance Sitting-balance support: No upper extremity supported;Feet supported Sitting balance-[name]: Good       Standing balance-[name]: Poor Standing balance comment: requires at least single UE support for all mobility.                            ADL either performed or assessed with clinical judgement   ADL Overall ADL's : Needs assistance/impaired     Grooming: Wash/dry  hands;Oral care;Wash/dry face;Min guard;Cueing for sequencing;Standing Grooming Details (indicate cue type and reason): Pt needed redirection to task, verbal cues                 Toilet Transfer: Min guard;Ambulation;Minimal assistance;RW           Functional mobility during ADLs: Min guard;Rolling walker General ADL Comments: pt limited by cognition, highly distracted and requires cueing to attend to tasks      Vision       Perception     Praxis      Cognition Arousal/Alertness: Awake/alert Behavior During Therapy: Restless Overall Cognitive Status: No family/caregiver present to determine baseline cognitive functioning Area of Impairment: Following commands;Safety/judgement;Awareness;Problem solving;Memory;Attention                   Current Attention Level: Selective Memory: Decreased short-term memory;Decreased recall of precautions Following Commands: Follows one step commands consistently Safety/Judgement: Decreased awareness of safety;Decreased awareness of deficits Awareness: Intellectual Problem Solving: Difficulty sequencing;Requires verbal cues General Comments: Pt easily distracted, constant redirection to task required        Exercises General Exercises - Lower Extremity Long Arc Quad: AROM;Both;Seated;10 reps Hip Flexion/Marching: AROM;Both;Seated;10 reps   Shoulder Instructions       General Comments      Pertinent Vitals/ Pain       Pain Assessment: Faces Faces Pain Scale: Hurts little more Pain Location: bil knees Pain Descriptors / Indicators: Aching Pain Intervention(s): Monitored during session;Repositioned  Home Living  Prior Functioning/Environment              Frequency  Min 2X/week        Progress Toward Goals  OT Goals(current goals can now be found in the care plan section)  Progress towards OT goals: Progressing toward goals  Acute Rehab OT  Goals Patient Stated Goal: to get home  OT Goal Formulation: With patient Time For Goal Achievement: 01/28/19 Potential to Achieve Goals: Good  Plan Frequency remains appropriate;Discharge plan needs to be updated    Co-evaluation                 AM-PAC OT "6 Clicks" Daily Activity     Outcome Measure   Help from another person eating meals?: None Help from another person taking care of personal grooming?: A Little Help from another person toileting, which includes using toliet, bedpan, or urinal?: A Little Help from another person bathing (including washing, rinsing, drying)?: A Little Help from another person to put on and taking off regular upper body clothing?: A Little Help from another person to put on and taking off regular lower body clothing?: A Little 6 Click Score: 19    End of Session Equipment Utilized During Treatment: Gait belt;Rolling walker  OT Visit Diagnosis: Unsteadiness on feet (R26.81);Muscle weakness (generalized) (M62.81)   Activity Tolerance Patient tolerated treatment well   Patient Left in bed;with call bell/phone within reach;with bed alarm set   Nurse Communication Mobility status        Time: 1275-1700 OT Time Calculation (min): 19 min  Charges: OT General Charges $OT Visit: 1 Visit OT Treatments $Self Care/Home Management : 8-22 mins  Hulda Humphrey OTR/L Acute Rehabilitation Services Pager: [phone] Office: [place] 01/18/2019, 5:01 PM

## 2019-01-18 NOTE — Progress Notes (Signed)
Physical Therapy Treatment Patient Details Name: Charlotte Castro MRN: [id number] DOB: 16-Jul-1932 Today's Date: 01/18/2019    History of Present Illness Pt is an 85 yo female s/p afib with RVR, acute kidney injury and SOB. Pt PMHx: anxiety, arthritis, endometrial CA, HTN, HLD, back sx, shoulder and wrist sx.    PT Comments    Pt sitting EOB on arrival with alarm going off. Pt distracted throughout session with limited awareness of safety and surrounding. Pt with increased gait tolerance with use of RW this session and encouraged assistance for mobility with education for alarm systems and fall risk with pt demonstrating no retention of education. Pt also educated for bil LE HEP to increase strength and balance.   HR 120 with activity    Follow Up Recommendations  SNF;Supervision for mobility/OOB     Equipment Recommendations  Rolling walker with 5" wheels    Recommendations for Other Services       Precautions / Restrictions Precautions Precautions: Fall    Mobility  Bed Mobility               General bed mobility comments: pt sitting EOB on arrival with bed alarm going off  Transfers Overall transfer level: Needs assistance   Transfers: Sit to/from Stand;Stand Pivot Transfers Sit to Stand: Min guard;Min assist Stand pivot transfers: Min guard       General transfer comment: min assist to stand from bed with guarding for safety with RW to pivot bed to [facility], guarding to rise from bSC with armrests  Ambulation/Gait Ambulation/Gait assistance: Min guard Gait Distance (Feet): 350 Feet Assistive device: Rolling walker (2 wheeled) Gait Pattern/deviations: Step-through pattern;Decreased stride length;Trunk flexed   Gait velocity interpretation: 1.31 - 2.62 ft/sec, indicative of limited community ambulator General Gait Details: cues for posture and proximity to walker with height adjusted. Directional cues required despite pt ability to state room number. Pt reporting  arthritic knee pain limiting gait. pt internally distracted throughout gait as well as distraction by various staff   Stairs             Wheelchair Mobility    Modified Rankin (Stroke Patients Only)       Balance Overall balance assessment: Needs assistance   Sitting balance-[name]: Good       Standing balance-[name]: Poor Standing balance comment: requires at least single UE support for all mobility.                             Cognition Arousal/Alertness: Awake/alert Behavior During Therapy: Restless Overall Cognitive Status: No family/caregiver present to determine baseline cognitive functioning Area of Impairment: Following commands;Safety/judgement;Awareness;Problem solving;Memory;Attention                   Current Attention Level: Selective Memory: Decreased short-term memory;Decreased recall of precautions Following Commands: Follows one step commands consistently Safety/Judgement: Decreased awareness of safety;Decreased awareness of deficits Awareness: Intellectual Problem Solving: Difficulty sequencing;Requires verbal cues General Comments: pt with internal distraction talking about people being discouraging and knee pain      Exercises General Exercises - Lower Extremity Long Arc Quad: AROM;Both;Seated;10 reps Hip Flexion/Marching: AROM;Both;Seated;10 reps    General Comments        Pertinent Vitals/Pain Faces Pain Scale: Hurts little more Pain Location: bil knees Pain Descriptors / Indicators: Aching Pain Intervention(s): Limited activity within patient's tolerance;Repositioned;Monitored during session    Home Living  Prior Function            PT Goals (current goals can now be found in the care plan section) Progress towards PT goals: Progressing toward goals    Frequency    Min 2X/week      PT Plan Current plan remains appropriate;Frequency needs to be updated     Co-evaluation              AM-PAC PT "6 Clicks" Mobility   Outcome Measure  Help needed turning from your back to your side while in a flat bed without using bedrails?: None Help needed moving from lying on your back to sitting on the side of a flat bed without using bedrails?: None Help needed moving to and from a bed to a chair (including a wheelchair)?: A Little Help needed standing up from a chair using your arms (e.g., wheelchair or bedside chair)?: A Little Help needed to walk in hospital room?: A Little Help needed climbing 3-5 steps with a railing? : A Little 6 Click Score: 20    End of Session Equipment Utilized During Treatment: Gait belt Activity Tolerance: Patient tolerated treatment well Patient left: in chair;with call bell/phone within reach;with chair alarm set Nurse Communication: Mobility status PT Visit Diagnosis: Unsteadiness on feet (R26.81);Other abnormalities of gait and mobility (R26.89);Muscle weakness (generalized) (M62.81);Difficulty in walking, not elsewhere classified (R26.2)     Time: 0737-1062 PT Time Calculation (min) (ACUTE ONLY): 21 min  Charges:  $Gait Training: 8-22 mins                     Arch Methot Pam Drown, PT Acute Rehabilitation Services Pager: [phone] Office: [place] 01/18/2019, 1:57 PM

## 2019-01-18 NOTE — Progress Notes (Signed)
Pt refused to comply high fall risk protocols.  Pt will not comply with the use of bed alarm.  Pt refusing to lay down and sitting on the side of the bed.  Also, pt refused to have IV fluids resumed during this time.  IV fluids was stopped previously, due to the loss of IV access.

## 2019-01-18 NOTE — Care Management Important Message (Signed)
Important Message  Patient Details  Name: CANISHA ISSAC MRN: [id number] Date of Birth: 1932/08/05   Medicare Important Message Given:  Yes     Shelda Altes 01/18/2019, 12:08 PM

## 2019-01-18 NOTE — Progress Notes (Signed)
ANTICOAGULATION CONSULT NOTE  Pharmacy Consult:  Coumadin Indication: atrial fibrillation   Patient Measurements: Height: 5\' 8"  (172.7 cm) Weight: 206 lb 4.8 oz (93.6 kg) IBW/kg (Calculated) : 63.9  Vital Signs: Temp: 97.8 F (36.6 C) (08/06 1059) Temp Source: Oral (08/06 1059) BP: 133/71 (08/06 1059) Pulse Rate: 73 (08/06 1059)  Labs: Recent Labs    01/16/19 0605 01/17/19 0504 01/18/19 0454  HGB  --  13.8  --   HCT  --  38.7  --   PLT  --  138*  --   LABPROT 34.3* 34.2* 23.7*  INR 3.5* 3.5* 2.2*  CREATININE 0.93 0.93  --     Assessment: 85 yr old female on warfarin PTA for atrial fibrillation.  Patient was being evaluated for SBO - imaging was negative. Warfarin was held Tuesday evening due to supratherapeutic INR. INR today is 2.2. Patient may need outpatient warfarin dose reduction at discharge.  PTA regimen:  5 mg daily except 2.5 mg on Sundays.  Goal of Therapy:  INR 2-3 Monitor platelets by anticoagulation protocol: Yes   Plan:  -Warfarin 1 mg po x1 -Daily PT / INR    Harvel Quale 01/18/2019 11:16 AM

## 2019-01-18 NOTE — Progress Notes (Signed)
Pt was walking in the room, trying to pull her iv off. Pt states that she feel terrible and is very anxious.   Pt wants fluids taken off. Per pt request LR fluids will be stopped.  Will page MD to make aware. Spoke with Benny Lennert MD and [place] regarding discharge orders clarification.

## 2019-01-18 NOTE — TOC Progression Note (Signed)
Transition of Care [facility]) - Progression Note    Patient Details  Name: Charlotte Castro MRN: [id number] Date of Birth: 12/11/1932  Transition of Care [facility]) CM/SW [place], [place] Phone Number: 01/18/2019, 3:51 PM  Clinical Narrative:     CSW attempt to call patient's daughter, Charlotte Castro, to give bed offers for SNF. CSW left voice message to return call.   Thurmond Butts, MSW, [facility] Clinical Social Worker [phone]   Expected Discharge Plan: Skilled Nursing Facility Barriers to Discharge: Continued Medical Work up  Expected Discharge Plan and Services Expected Discharge Plan: [place] In-house Referral: NA   Post Acute Care Choice: NA Living arrangements for the past 2 months: Single Family Home Expected Discharge Date: 01/18/19                         [facility] Arranged: NA           Social Determinants of Health (SDOH) Interventions    Readmission Risk Interventions No flowsheet data found.

## 2019-01-19 ENCOUNTER — Other Ambulatory Visit: Payer: Self-pay

## 2019-01-19 ENCOUNTER — Emergency Department (HOSPITAL_COMMUNITY): Payer: Medicare Other

## 2019-01-19 ENCOUNTER — Emergency Department (HOSPITAL_COMMUNITY)
Admission: EM | Admit: 2019-01-19 | Discharge: 2019-01-20 | Disposition: A | Discharge status: Home / Self Care | Payer: Medicare Other | Attending: Emergency Medicine | Admitting: Emergency Medicine

## 2019-01-19 DIAGNOSIS — I110 Hypertensive heart disease with heart failure: Secondary | ICD-10-CM | POA: Insufficient documentation

## 2019-01-19 DIAGNOSIS — E039 Hypothyroidism, unspecified: Secondary | ICD-10-CM | POA: Insufficient documentation

## 2019-01-19 DIAGNOSIS — R079 Chest pain, unspecified: Secondary | ICD-10-CM

## 2019-01-19 DIAGNOSIS — Z8542 Personal history of malignant neoplasm of other parts of uterus: Secondary | ICD-10-CM | POA: Insufficient documentation

## 2019-01-19 DIAGNOSIS — G9341 Metabolic encephalopathy: Secondary | ICD-10-CM

## 2019-01-19 DIAGNOSIS — I4891 Unspecified atrial fibrillation: Secondary | ICD-10-CM | POA: Insufficient documentation

## 2019-01-19 DIAGNOSIS — I5032 Chronic diastolic (congestive) heart failure: Secondary | ICD-10-CM | POA: Insufficient documentation

## 2019-01-19 DIAGNOSIS — K449 Diaphragmatic hernia without obstruction or gangrene: Secondary | ICD-10-CM

## 2019-01-19 DIAGNOSIS — R0789 Other chest pain: Secondary | ICD-10-CM | POA: Diagnosis not present

## 2019-01-19 DIAGNOSIS — Z79899 Other long term (current) drug therapy: Secondary | ICD-10-CM | POA: Insufficient documentation

## 2019-01-19 DIAGNOSIS — Z7901 Long term (current) use of anticoagulants: Secondary | ICD-10-CM | POA: Insufficient documentation

## 2019-01-19 DIAGNOSIS — R8281 Pyuria: Secondary | ICD-10-CM

## 2019-01-19 LAB — CBC WITH DIFFERENTIAL/PLATELET
Abs Immature Granulocytes: 0.06 10*3/uL (ref 0.00–0.07)
Basophils Absolute: 0 10*3/uL (ref 0.0–0.1)
Basophils Relative: 1 %
Eosinophils Absolute: 0.1 10*3/uL (ref 0.0–0.5)
Eosinophils Relative: 1 %
HCT: 40 % (ref 36.0–46.0)
Hemoglobin: 14.2 g/dL (ref 12.0–15.0)
Immature Granulocytes: 1 %
Lymphocytes Relative: 12 %
Lymphs Abs: 1 10*3/uL (ref 0.7–4.0)
MCH: 33.3 pg (ref 26.0–34.0)
MCHC: 35.5 g/dL (ref 30.0–36.0)
MCV: 93.7 fL (ref 80.0–100.0)
Monocytes Absolute: 1.1 10*3/uL — ABNORMAL HIGH (ref 0.1–1.0)
Monocytes Relative: 14 %
Neutro Abs: 6.1 10*3/uL (ref 1.7–7.7)
Neutrophils Relative %: 71 %
Platelets: 165 10*3/uL (ref 150–400)
RBC: 4.27 MIL/uL (ref 3.87–5.11)
RDW: 13.3 % (ref 11.5–15.5)
WBC: 8.4 10*3/uL (ref 4.0–10.5)
nRBC: 0 % (ref 0.0–0.2)

## 2019-01-19 LAB — PROTIME-INR
INR: 1.7 — ABNORMAL HIGH (ref 0.8–1.2)
Prothrombin Time: 19.8 seconds — ABNORMAL HIGH (ref 11.4–15.2)

## 2019-01-19 IMAGING — DX CHEST - 2 VIEW
2 series · 2 of 2 positions shown · non-contrast
Comparison: Chest radiograph dated [DATE]

CLINICAL DATA: 86-year-old female with chest pain.

EXAM:
CHEST - 2 VIEW

[chest lat]
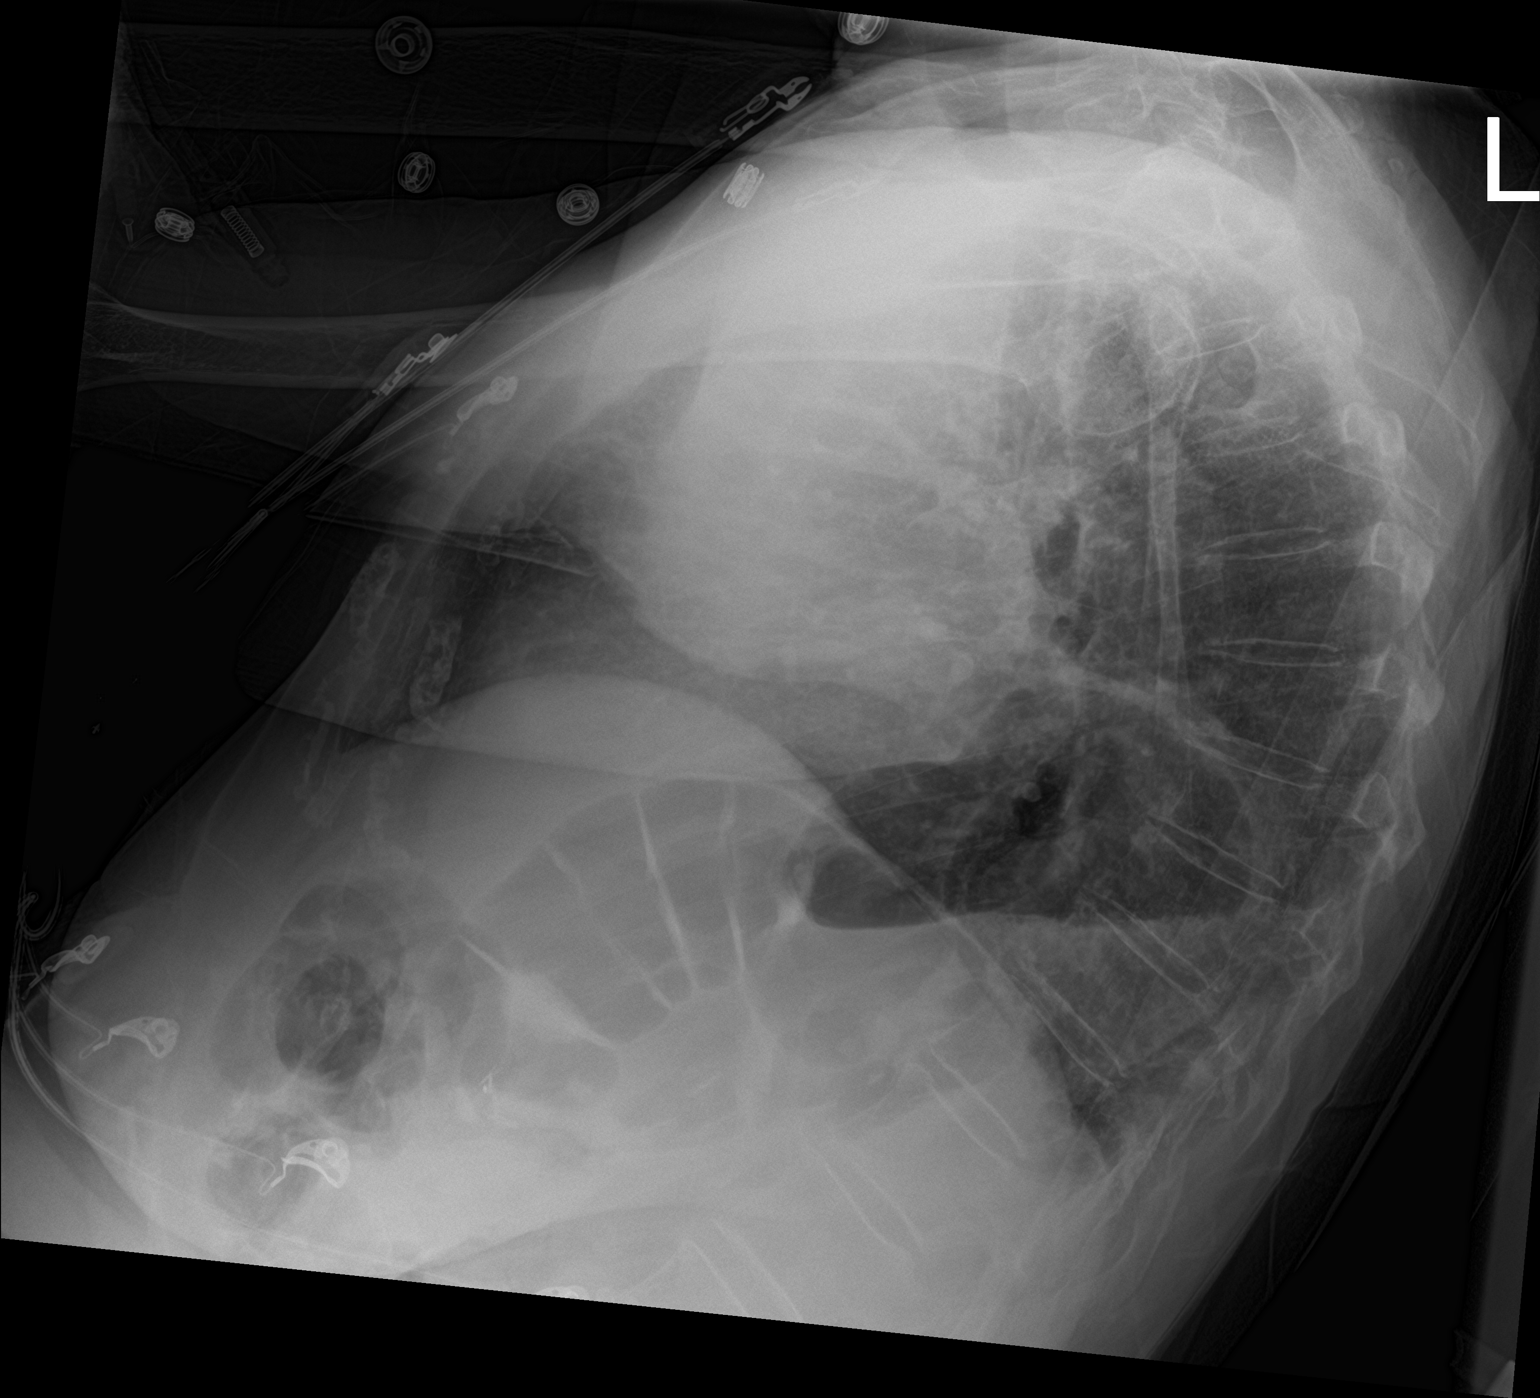

[chest ap]
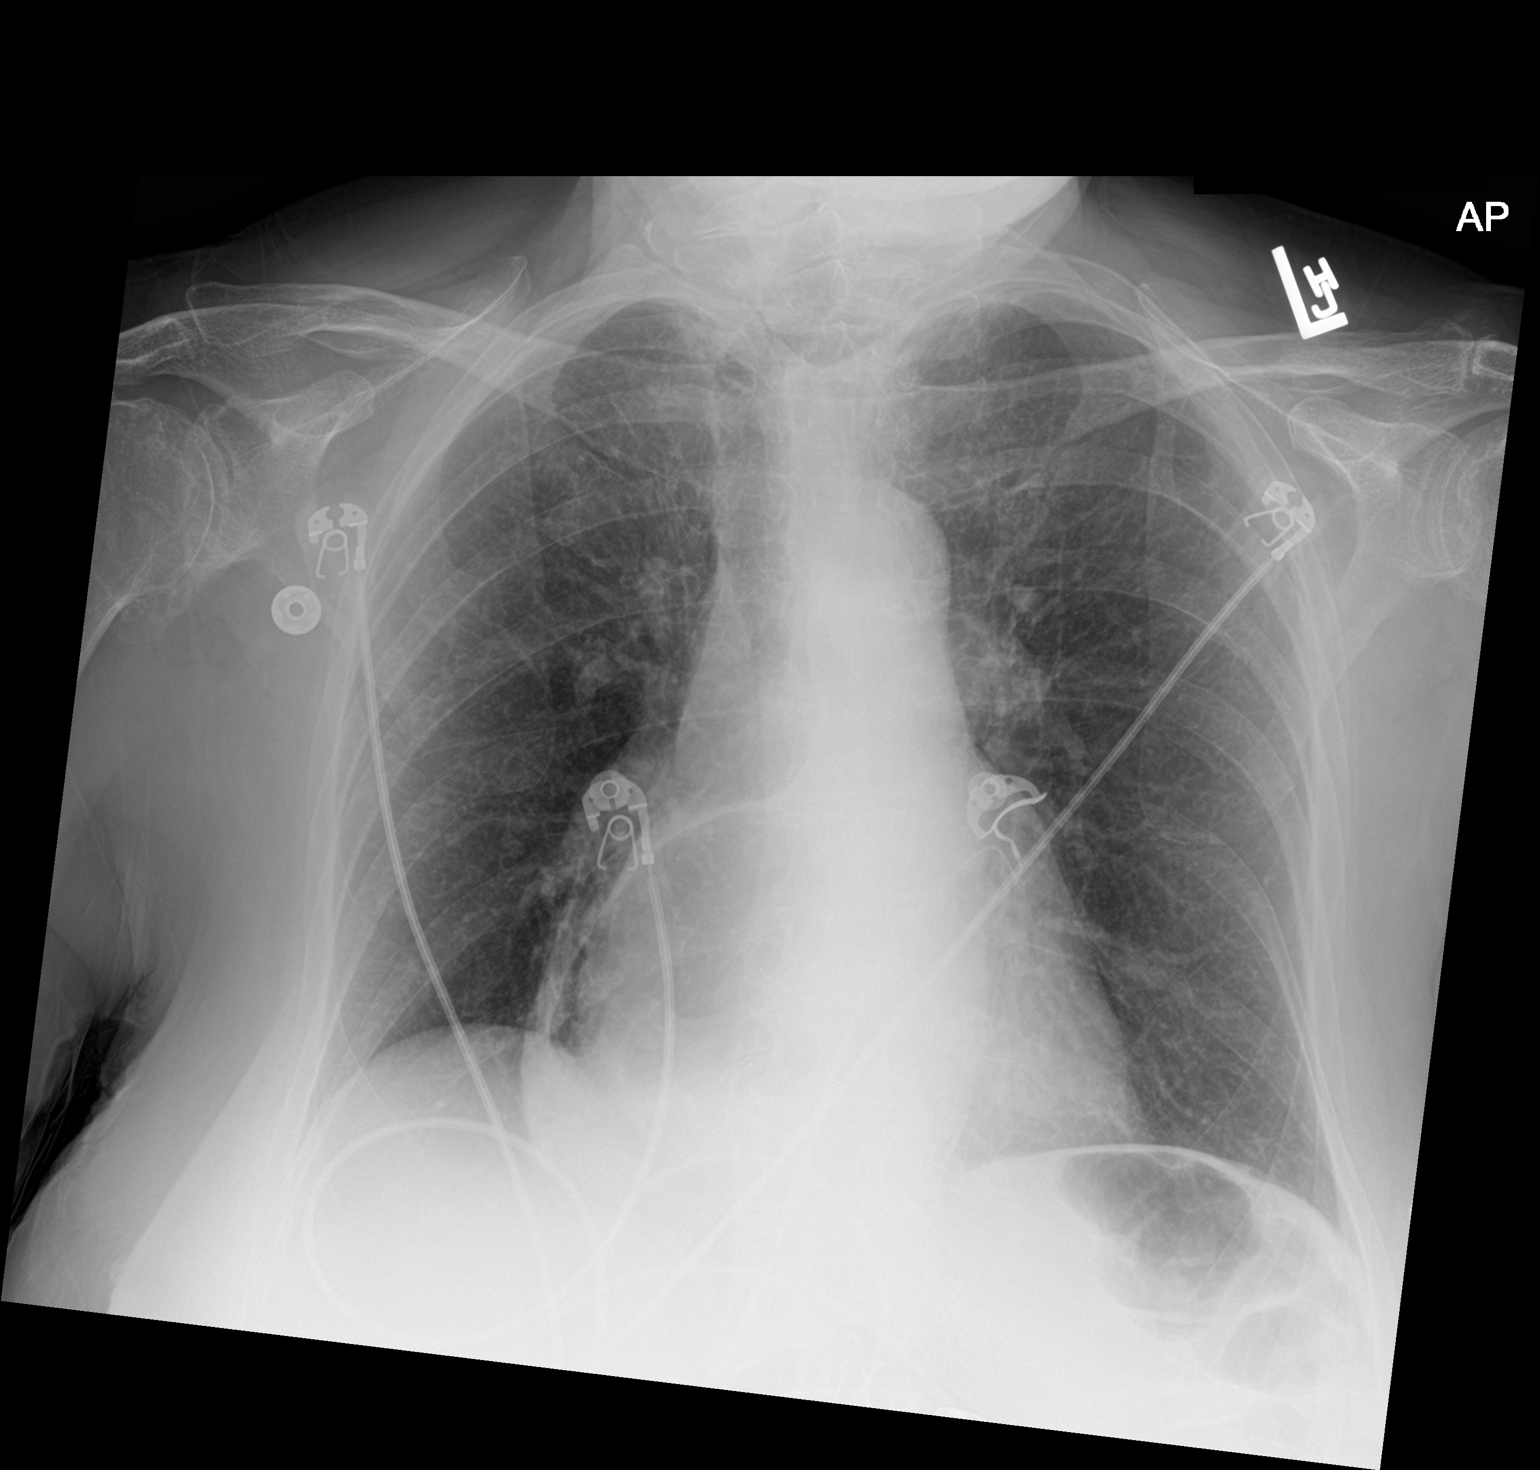

[2 of 2 positions shown; findings below may reference images not displayed]

FINDINGS: There is no focal consolidation, pleural effusion, or pneumothorax.
Large hiatal hernia in noted. Stable cardiac silhouette. No acute
osseous pathology.
IMPRESSION: 1. No acute cardiopulmonary process.
2. Large hiatal hernia.

## 2019-01-19 MED ORDER — WARFARIN SODIUM 5 MG PO TABS: 5.0000 mg | ORAL_TABLET | Freq: Once | ORAL | Status: DC

## 2019-01-19 NOTE — ED Provider Notes (Signed)
[facility] EMERGENCY DEPARTMENT Provider Note   CSN: [id number] Arrival date & time: 01/19/19  2233     History   Chief Complaint Chief Complaint  Patient presents with  . Chest Pain    HPI Charlotte Castro is a 85 y.o. female.     Patient with PMH of afib on coumadin, CHF, presents from SNF with CC of CP.  She was just discharged from [facility] this afternoon.  Reports that SNF didn't know how to take care of her and she began experiencing chest pain.  She is a poor historian.  Level 5 caveat applies.  The history is provided by the patient. No language interpreter was used.    Past Medical History:  Diagnosis Date  . Anemia    history of  . Anxiety   . Arthritis    "left knee" (04/05/2017)  . Atrial fibrillation ([place])   . Atrial flutter ([place])    typical appearing  . Atrial tachycardia ([place])    ablated 11/17/10  by JA  from the [facility] of the aorta  . Chest pain on exertion   . CHF (congestive heart failure) ([place])   . Chronic lower back pain   . Chronic nausea   . Dizziness    chronic and of an unclear etiology  . Dyspnea   . Endometrial cancer (HCC)    grade 1  . Gallstone pancreatitis   . Gastritis   . GERD (gastroesophageal reflux disease)   . History of blood transfusion ~ 1948  . History of hiatal hernia   . HTN (hypertension)   . Hyperlipemia   . Hypothyroidism   . Left leg numbness   . Obesity   . Osteoporosis   . PMB (postmenopausal bleeding)   . Sinus headache   . Toe ulcer ([place])    left 3rd toe  . Vitamin D deficiency     Patient Active Problem List   Diagnosis Date Noted  . AKI (acute kidney injury) ([place]) 01/14/2019  . B12 deficiency 08/08/2018  . Hypothyroidism 08/08/2018  . Endometrial cancer ([place]) 08/05/2017  . Toe ulcer, left, with unspecified severity ([place]) 08/04/2017  . 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 ([place]) 08/04/2017  . Postmenopausal bleeding 08/01/2017  . Delirium 04/14/2016  .  Tremor 04/14/2016  . Chronic diastolic CHF (congestive heart failure) ([place]) 04/10/2016  . Essential hypertension, benign 01/18/2014  . Long term current use of anticoagulant therapy 01/18/2014  . Permanent atrial fibrillation 06/10/2011    Past Surgical History:  Procedure Laterality Date  . ATRIAL ABLATION SURGERY  11/17/10   Atrial tachycardia arising from [facility] of the aorta ablated by JA  . BACK SURGERY    . CARDIAC CATHETERIZATION  01/11/2011   Archie Endo 01/12/2011  . CHOLECYSTECTOMY N/A 04/08/2017   Procedure: LAPAROSCOPIC CHOLECYSTECTOMY;  Surgeon: Georganna Skeans, MD;  Location: [facility];  Service: General;  Laterality: N/A;  . FRACTURE SURGERY    . HYSTEROSCOPY W/D&C N/A 08/05/2017   Procedure: DILATATION AND CURETTAGE /HYSTEROSCOPY AND POLYPECTOMY;  Surgeon: Osborne Oman, MD;  Location: [facility];  Service: Gynecology;  Laterality: N/A;  . LUMBAR SPINE SURGERY  ~ 1998  . ROBOTIC ASSISTED TOTAL HYSTERECTOMY WITH BILATERAL SALPINGO OOPHERECTOMY Bilateral 09/20/2017   Procedure: XI ROBOTIC ASSISTED TOTAL HYSTERECTOMY WITH BILATERAL SALPINGO OOPHORECTOMY, SENTINAL LYMPH NODE BIOPSY;  Surgeon: Everitt Amber, MD;  Location: [facility];  Service: Gynecology;  Laterality: Bilateral;  . SHOULDER SURGERY Right 1984 X2   /  ntoes 01/19/2011  . WRIST FRACTURE SURGERY Left 1983     OB History    Gravida  3   Para      Term      Preterm      AB  1   Living        SAB  1   TAB      Ectopic      Multiple      Live Births               Home Medications    Prior to Admission medications   Medication Sig Start Date End Date Taking? Authorizing Provider  acetaminophen (TYLENOL) 650 MG CR tablet Take 650 mg by mouth every morning.    [provider]  cyanocobalamin (,VITAMIN B-12,) 1000 MCG/ML injection Inject 1 ml once a week for 1 month. Then 1 ml once a month thereafter. Patient not taking: Reported on 01/13/2019 08/15/18   Isaac Bliss, Rayford Halsted, MD  Cyanocobalamin (VITAMIN  B-12 PO) Take 0.5 tablets by mouth 2 (two) times daily with a meal.    [provider]  levothyroxine (SYNTHROID, LEVOTHROID) 88 MCG tablet Take 1 tablet (88 mcg total) by mouth daily before breakfast. 08/08/18   Isaac Bliss, Rayford Halsted, MD  metoprolol tartrate (LOPRESSOR) 50 MG tablet TAKE ONE TABLET TWICE DAILY WITH A MEAL Patient taking differently: Take 50 mg by mouth 2 (two) times daily with a meal.  10/04/18   Jettie Booze, MD  QUEtiapine (SEROQUEL) 25 MG tablet Take 1 tablet (25 mg total) by mouth 2 (two) times daily as needed (hallucinations). 01/18/19   Swayze, Ava, DO  SYRINGE-NEEDLE, DISP, 3 ML 25G X 1-1/2" 3 ML MISC Use as needed for B12 injections 08/15/18   Isaac Bliss, Rayford Halsted, MD  warfarin (COUMADIN) 5 MG tablet TAKE 1/2 TO ONE TABLET DAILY AS DIRECTEDBY COUMADIN CLINIC Patient taking differently: Take 2.5-5 mg by mouth See admin instructions. Take 2.5 mg by mouth in the evening on Sunday and 5 mg on Mon/Tues/Wed/Thurs/Fri/Sat 10/04/18   Jettie Booze, MD    Family History Family History  Problem Relation Age of Onset  . Heart attack Father   . Hypertension Father     Social History Social History   Tobacco Use  . Smoking status: Never Smoker  . Smokeless tobacco: Never Used  Substance Use Topics  . Alcohol use: No  . Drug use: No     Allergies   Iodinated diagnostic agents and Penicillins   Review of Systems Review of Systems  Reason unable to perform ROS: delirium.     Physical Exam Updated Vital Signs BP (!) 157/80 (BP Location: Right Arm)   Pulse 95   Temp 98.1 F (36.7 C) (Oral)   Resp 18   Ht 5\' 8"  (1.727 m)   Wt 93 kg   SpO2 98%   BMI 31.17 kg/m   Physical Exam Vitals signs and nursing note reviewed.  Constitutional:      General: She is not in acute distress.    Appearance: She is well-developed.  HENT:     Head: Normocephalic and atraumatic.  Eyes:     Conjunctiva/sclera: Conjunctivae normal.  Neck:      Musculoskeletal: Neck supple.  Cardiovascular:     Rate and Rhythm: Normal rate and regular rhythm.     Heart sounds: No murmur.  Pulmonary:     Effort: Pulmonary effort is normal. No respiratory distress.  Breath sounds: Normal breath sounds.  Abdominal:     Palpations: Abdomen is soft.     Tenderness: There is no abdominal tenderness.  Musculoskeletal: Normal range of motion.     Comments: No LE swelling  Skin:    General: Skin is warm and dry.  Neurological:     Mental Status: She is alert and oriented to person, place, and time.  Psychiatric:        Mood and Affect: Mood normal.      ED Treatments / Results  Labs (all labs ordered are listed, but only abnormal results are displayed) Labs Reviewed  CBC WITH DIFFERENTIAL/PLATELET  BASIC METABOLIC PANEL  PROTIME-INR  TROPONIN I (HIGH SENSITIVITY)    EKG None  Radiology No results found.  Procedures Procedures (including critical care time)  Medications Ordered in ED Medications - No data to display   Initial Impression / Assessment and Plan / ED Course  I have reviewed the triage vital signs and the nursing notes.  Pertinent labs & imaging results that were available during my care of the patient were reviewed by me and considered in my medical decision making (see chart for details).        Patient with reported chest pain.  Was just discharged yesterday afternoon to a skilled nursing facility.  Complained that the SNF did not know how to take care of her and then said that she had chest pain and was brought back to the emergency department.  I have a low suspicion for acute process.  Will check basic labs and reassess.  Laboratory work-up is reassuring.  High-sensitivity troponins have been essentially unchanged.  Patient seen by discussed with Dr. Roxanne Mins, who agrees with plan for discharge.  Final Clinical Impressions(s) / ED Diagnoses   Final diagnoses:  Nonspecific chest pain    ED Discharge  Orders    None       Montine Circle, PA-C 30/09/23 3007    Delora Fuel, MD 62/[birth year]/33 [phone]

## 2019-01-19 NOTE — ED Notes (Signed)
Delay in lab dray pt not in room.

## 2019-01-19 NOTE — ED Triage Notes (Signed)
Brought in by EMS for C/O chest pain. Pt was discharged to [facility] from here today. Staff reported to EMS that Patient was upset and refusing to use bed pan. She started complaining of chest pain. Pt told EMS she wanted to come back to the hospital because they didn't know how to take care of her. Pt states her chest pain is the center of chest and sometimes is accompanied with shortness of breath. EMS administered 324mg  aspirin and 1 nitro which relieved chest pain. EMS reports patient is in Afib, HR 100-130

## 2019-01-19 NOTE — Progress Notes (Signed)
ANTICOAGULATION CONSULT NOTE  Pharmacy Consult:  Coumadin Indication: atrial fibrillation   Patient Measurements: Height: 5\' 8"  (172.7 cm) Weight: 205 lb (93 kg) IBW/kg (Calculated) : 63.9  Vital Signs: Temp: 97.3 F (36.3 C) (08/07 0623) Temp Source: Oral (08/07 0623) BP: 176/88 (08/07 0623) Pulse Rate: 87 (08/07 0623)  Labs: Recent Labs    01/17/19 0504 01/18/19 0454 01/19/19 0530  HGB 13.8  --   --   HCT 38.7  --   --   PLT 138*  --   --   LABPROT 34.2* 23.7* 19.8*  INR 3.5* 2.2* 1.7*  CREATININE 0.93  --   --     Assessment: 85 yr old female on warfarin PTA for atrial fibrillation.  Patient was being evaluated for SBO - imaging was negative. Warfarin was held Tuesday evening due to supratherapeutic INR. INR today is 1.7.  PTA regimen:  5 mg daily except 2.5 mg on Sundays.  Goal of Therapy:  INR 2-3 Monitor platelets by anticoagulation protocol: Yes   Plan:  -Warfarin 5 mg po x1 -May need to bridge with heparin if INR continues to fall. -Daily PT / INR  [place] Crane, Roylene Reason, [place] Pharmacist Phone [phone]  01/19/2019 8:13 AM

## 2019-01-19 NOTE — Discharge Summary (Addendum)
Physician Discharge Summary  Charlotte Castro SUP:[id number] DOB: February 17, 1933 DOA: 01/13/2019  PCP: Isaac Bliss, Rayford Halsted, MD  Admit date: 01/13/2019 Discharge date: 01/19/2019  Recommendations for Outpatient Follow-up:  1. Discharge to SNF 2. PT/OT 3. Follow up with PCP in 7-10 days. 4. Follow up with GI as directed.   Discharge Diagnoses: 1. Acute encephalopathy with hallucinations.  2. Delirium in the setting of dementia 3. Paraesophageal hernia 4. AKI 5. Mild hypokalemia 6. Bilateral lower extremity weakness 7. Atrial fibrillation with RVR 8. Hypertension 9. Hypothyroidism 10. UTI treated with fosfomycin in ED. 11. Generalized Weakness  Discharge Condition: Fair Disposition: SNF  Diet recommendation: Soft Heart healthy diet.  Filed Weights   01/17/19 0441 01/18/19 0707 01/19/19 0734  Weight: 93.5 kg 93.6 kg 93 kg    History of present illness:   Charlotte Castro a 85 y.o.femalewith medical history significant ofpermanent atrial fibrillation and hypothyroid disease. Reports she is been generally feeling weak for more than a year. She is informed all of her doctors that she does not have the energy she thinks she should have. August 1 the patient complained of increasing weakness along with shortness of breath and being very lightheaded. Because of these symptoms emergency department. She denied having any pain or any focal complaints.  She was evaluated at bedside earlier this morning with some mild nausea, but no other significant symptomatology noted. Patient was admitted with some AKI,likely prerenal,from poor oral intake along with atrial fibrillation with RVR with marked improvement in the ED after 1 dose of IV metoprolol. She is on warfarin for anticoagulation. She is noted to have a periesophageal hiatal hernia on her chest x-ray.  Urinalysis in the ED was positive for a UTI for which the patient received one dose of fosfomycin.  Unfortunately, her  symptoms began to worsen after she had lunch. She began to have significant nausea and vomiting as well as abdominal pain for which Zofran and Dilaudid were given. Rapid response was called on account of the fact that her blood pressures began to elevate significantly and she was becoming hypoxemic requiring 4 L nasal cannula. Dr. Sloan Leiter was called. He evaluated patient at bedside and noted a benign abdominal examination with improving blood pressures by the time of his evaluation. It appears, that patient may have gagged on some of her food causing the symptoms. She is overall now feeling better and will be given Protonix as well as GI cocktail,which should help with her symptoms  Hospital Course:  The patient was admitted to a telemetry bed.   KUB that was ordered earlier showed no signs of bowel obstruction.She was closeley monitored and maintained on IV fluids with repeat labs in a.m.   The patient has had some difficulty with hallucinations. It is thought that is due to dementia and anxiety. Psychiatry was consulted and they have recommended Seroquel 25 mg bid prn.  She is now tolerating a soft diet. She should follow up with GI as outpatient for further evaluation of her paraesophageal hernia.  The patient is complaining of bilateral lower extremity weakness. She denied any loss of control of bowel or bladder. CT of the lumbar spine was ordered, but the patient was unable to comply with the exam. She was evaluated by PT who recommended SNF placement and use of a rolling walker with 5" wheels.  Today's assessment: S: The patient is resting carefully. O: Vitals:  Vitals:   01/19/19 0623 01/19/19 0811  BP: (!) 176/88 (!) 142/75  Pulse: 87 88  Resp: 20 18  Temp: (!) 97.3 F (36.3 C)   SpO2: 95%    Constitutional:   The patient is awake and alert. She is in no acute distress.  Respiratory:   No increased work of breathing.   No wheezes, rales, or rhonchi.  No tactile  fremitus. Cardiovascular:   Regular, rate and rhythm.  No murmurs, ectopy, or gallups.  No lateral PMI. No thrills. Abdomen:   Abdomen is soft, non-tender, non-distended.  No hernias, masses, or organomegaly.  Normoactive bowel sounds.  Musculoskeletal:   No cyanosis, clubbing, or edema. Skin:   No rashes, lesions, ulcers  palpation of skin: no induration or nodules Neurologic:   CN 2-12 intact  Sensation all 4 extremities intact  Pt complains of pain and weakness in legs bilaterally.  Psychiatric:   Mental status ? Mood, affect appropriate ? Orientation to person, place, time   judgment and insight appear intact   Discharge Instructions  Discharge Instructions    Activity as tolerated - No restrictions   Complete by: As directed    Call MD for:  difficulty breathing, headache or visual disturbances   Complete by: As directed    Call MD for:  persistant nausea and vomiting   Complete by: As directed    Diet - low sodium heart healthy   Complete by: As directed    Discharge instructions   Complete by: As directed    Follow up with PCP in 7-10 days after discharge from SNF. Follow up with GI in   Increase activity slowly   Complete by: As directed     Soft diet. Allergies as of 01/19/2019      Reactions   Iodinated Diagnostic Agents Shortness Of Breath   Causes headaches, also   Penicillins Other (See Comments)   Tolerated Zosyn Oct 2018- Patient is unaware this is an "allergy" Did it involve swelling of the face/tongue/throat, SOB, or low BP? Unk Did it involve sudden or severe rash/hives, skin peeling, or any reaction on the inside of your mouth or nose? Unk Did you need to seek medical attention at a hospital or doctor's office? Unk When did it last happen? Unk If all above answers are "NO", may proceed with cephalosporin use.      Medication List    TAKE these medications   acetaminophen 650 MG CR tablet Commonly known as: TYLENOL Take 650 mg  by mouth every morning.   VITAMIN B-12 PO Take 0.5 tablets by mouth 2 (two) times daily with a meal.   cyanocobalamin 1000 MCG/ML injection Commonly known as: (VITAMIN B-12) Inject 1 ml once a week for 1 month. Then 1 ml once a month thereafter.   levothyroxine 88 MCG tablet Commonly known as: SYNTHROID Take 1 tablet (88 mcg total) by mouth daily before breakfast.   metoprolol tartrate 50 MG tablet Commonly known as: LOPRESSOR TAKE ONE TABLET TWICE DAILY WITH A MEAL What changed: See the new instructions.   QUEtiapine 25 MG tablet Commonly known as: SEROQUEL Take 1 tablet (25 mg total) by mouth 2 (two) times daily as needed (hallucinations).   SYRINGE-NEEDLE (DISP) 3 ML 25G X 1-1/2" 3 ML Misc Use as needed for B12 injections   warfarin 5 MG tablet Commonly known as: COUMADIN Take as directed. If you are unsure how to take this medication, talk to your nurse or doctor. Original instructions: TAKE 1/2 TO ONE TABLET DAILY AS DIRECTEDBY COUMADIN CLINIC What changed:   how much  to take  how to take this  when to take this  additional instructions      Allergies  Allergen Reactions  . Iodinated Diagnostic Agents Shortness Of Breath    Causes headaches, also  . Penicillins Other (See Comments)    Tolerated Zosyn Oct 2018- Patient is unaware this is an "allergy" Did it involve swelling of the face/tongue/throat, SOB, or low BP? Unk Did it involve sudden or severe rash/hives, skin peeling, or any reaction on the inside of your mouth or nose? Unk Did you need to seek medical attention at a hospital or doctor's office? Unk When did it last happen? Unk If all above answers are "NO", may proceed with cephalosporin use.     The results of significant diagnostics from this hospitalization (including imaging, microbiology, ancillary and laboratory) are listed below for reference.    Significant Diagnostic Studies: Dg Chest 2 View  Result Date: 01/13/2019 CLINICAL DATA:   Shortness of breath and chest pain EXAM: CHEST - 2 VIEW COMPARISON:  June 10, 2017 chest radiograph and chest CT Oct 26, 2017 FINDINGS: There is no edema or consolidation. Heart size and pulmonary vascularity are normal. No adenopathy. There is a large paraesophageal hernia. No bone lesions. No pneumothorax. IMPRESSION: Large paraesophageal hernia. No edema or consolidation. Stable cardiac silhouette. Electronically Signed   By: Lowella Grip III M.D.   On: 01/13/2019 16:42   Dg Abd 1 View  Result Date: 01/14/2019 CLINICAL DATA:  Nausea and vomiting EXAM: ABDOMEN - 1 VIEW COMPARISON:  None FINDINGS: No dilated loops of large or small bowel. No pathologic calcifications. No organomegaly. No aggressive osseous lesion. No intraperitoneal free air. Lower lumbar fusion. Degenerative narrowing of the hip joints. IMPRESSION: No bowel obstruction. Electronically Signed   By: Suzy Bouchard M.D.   On: 01/14/2019 14:37    Microbiology: Recent Results (from the past 240 hour(s))  SARS Coronavirus 2 [facility] order, Performed in [facility] hospital lab) Nasopharyngeal Nasopharyngeal Swab     Status: None   Collection Time: 01/13/19  5:27 PM   Specimen: Nasopharyngeal Swab  Result Value Ref Range Status   SARS Coronavirus 2 NEGATIVE NEGATIVE Final    Comment: (NOTE) If result is NEGATIVE SARS-CoV-2 target nucleic acids are NOT DETECTED. The SARS-CoV-2 RNA is generally detectable in upper and lower  respiratory specimens during the acute phase of infection. The lowest  concentration of SARS-CoV-2 viral copies this assay can detect is 250  copies / mL. A negative result does not preclude SARS-CoV-2 infection  and should not be used as the sole basis for treatment or other  patient management decisions.  A negative result may occur with  improper specimen collection / handling, submission of specimen other  than nasopharyngeal swab, presence of viral mutation(s) within the  areas targeted by this  assay, and inadequate number of viral copies  (<250 copies / mL). A negative result must be combined with clinical  observations, patient history, and epidemiological information. If result is POSITIVE SARS-CoV-2 target nucleic acids are DETECTED. The SARS-CoV-2 RNA is generally detectable in upper and lower  respiratory specimens dur ing the acute phase of infection.  Positive  results are indicative of active infection with SARS-CoV-2.  Clinical  correlation with patient history and other diagnostic information is  necessary to determine patient infection status.  Positive results do  not rule out bacterial infection or co-infection with other viruses. If result is PRESUMPTIVE POSTIVE SARS-CoV-2 nucleic acids MAY BE PRESENT.   A  presumptive positive result was obtained on the submitted specimen  and confirmed on repeat testing.  While 2019 novel coronavirus  (SARS-CoV-2) nucleic acids may be present in the submitted sample  additional confirmatory testing may be necessary for epidemiological  and / or clinical management purposes  to differentiate between  SARS-CoV-2 and other Sarbecovirus currently known to infect humans.  If clinically indicated additional testing with an alternate test  methodology [phone]) is advised. The SARS-CoV-2 RNA is generally  detectable in upper and lower respiratory sp ecimens during the acute  phase of infection. The expected result is Negative. Fact Sheet for Patients:  StrictlyIdeas.no Fact Sheet for Healthcare Providers: BankingDealers.co.za This test is not yet approved or cleared by the [place] FDA and has been authorized for detection and/or diagnosis of SARS-CoV-2 by FDA under an Emergency Use Authorization (EUA).  This EUA will remain in effect (meaning this test can be used) for the duration of the COVID-19 declaration under Section 564(b)(1) of the Act, 21 U.S.C. section 360bbb-3(b)(1),  unless the authorization is terminated or revoked sooner. Performed at [facility], [place] [address], [place], [place] [address]   Urine culture     Status: Abnormal   Collection Time: 01/13/19 10:22 PM   Specimen: Urine, Clean Catch  Result Value Ref Range Status   Specimen Description URINE, CLEAN CATCH  Final   Special Requests   Final    NONE Performed at [facility], [place] [address] [place], [place] [address]    Culture (A)  Final    40,000 COLONIES/mL MULTIPLE SPECIES PRESENT, SUGGEST RECOLLECTION   Report Status 01/15/2019 FINAL  Final  SARS CORONAVIRUS 2 Nasal Swab Aptima Multi Swab     Status: None   Collection Time: 01/18/19  4:13 PM   Specimen: Aptima Multi Swab; Nasal Swab  Result Value Ref Range Status   SARS Coronavirus 2 NEGATIVE NEGATIVE Final    Comment: (NOTE) SARS-CoV-2 target nucleic acids are NOT DETECTED. The SARS-CoV-2 RNA is generally detectable in upper and lower respiratory specimens during the acute phase of infection. Negative results do not preclude SARS-CoV-2 infection, do not rule out co-infections with other pathogens, and should not be used as the sole basis for treatment or other patient management decisions. Negative results must be combined with clinical observations, patient history, and epidemiological information. The expected result is Negative. Fact Sheet for Patients: SugarRoll.be Fact Sheet for Healthcare Providers: https://www.woods-mathews.com/ This test is not yet approved or cleared by the [place] FDA and  has been authorized for detection and/or diagnosis of SARS-CoV-2 by FDA under an Emergency Use Authorization (EUA). This EUA will remain  in effect (meaning this test can be used) for the duration of the COVID-19 declaration under Section 56 4(b)(1) of the Act, 21 U.S.C. section 360bbb-3(b)(1), unless the authorization is terminated or revoked sooner. Performed at  [facility], [place] [address], [place], [place] [address]      Labs: Basic Metabolic Panel: Recent Labs  Lab 01/13/19 [phone] 01/13/19 1615 01/15/19 0546 01/15/19 1249 01/16/19 0605 01/17/19 0504  NA 141 139 142  --  143 142  K 4.0 4.0 3.4*  --  4.5 3.9  CL 111 107 109  --  111 111  CO2 22 19* 20*  --  25 22  GLUCOSE 75 122* 110*  --  87 96  BUN 16 17 13   --  8 7*  CREATININE 1.16* 1.43* 1.16*  --  0.93 0.93  CALCIUM 9.5 10.2 9.3  --  8.9 8.9  MG  --   --  1.7 1.8  --   --    Liver Function Tests: Recent Labs  Lab 01/13/19 1615 01/15/19 0546  AST 25 29  ALT 16 16  ALKPHOS 68 61  BILITOT 1.1 2.0*  PROT 7.0 6.2*  ALBUMIN 4.0 3.5   No results for input(s): LIPASE, AMYLASE in the last 168 hours. No results for input(s): AMMONIA in the last 168 hours. CBC: Recent Labs  Lab 01/13/19 1615 01/15/19 0546 01/17/19 0504  WBC 8.2 9.0 5.1  NEUTROABS 5.1  --   --   HGB 16.0* 14.9 13.8  HCT 45.0 40.7 38.7  MCV 91.6 89.6 92.1  PLT 191 148* 138*   Cardiac Enzymes: No results for input(s): CKTOTAL, CKMB, CKMBINDEX, TROPONINI in the last 168 hours. BNP: BNP (last 3 results) No results for input(s): BNP in the last 8760 hours.  ProBNP (last 3 results) No results for input(s): PROBNP in the last 8760 hours.  CBG: No results for input(s): GLUCAP in the last 168 hours.  Principal Problem:   Delirium Active Problems:   Permanent atrial fibrillation   Essential hypertension, benign   Long term current use of anticoagulant therapy   Chronic diastolic CHF (congestive heart failure) ([place])   Hypothyroidism   AKI (acute kidney injury) ([place])   Time coordinating discharge: 38 minutes  Signed:        Leyton Brownlee, DO [facility]  01/19/2019, 1:48 PM

## 2019-01-19 NOTE — Progress Notes (Addendum)
PROGRESS NOTE  Charlotte Castro FIE:[id number] DOB: 02/08/1933 DOA: 01/13/2019 PCP: Isaac Bliss, Rayford Halsted, MD  Brief History   Charlotte Kleeman Wilsonis a 85 y.o.femalewith medical history significant ofpermanent atrial fibrillation and hypothyroid disease. Reports she is been generally feeling weak for more than a year. She is informed all of her doctors that she does not have the energy she thinks she should have. August 1 the patient complained of increasing weakness along with shortness of breath and being very lightheaded. Because of these symptoms emergency department. She denied having any pain or any focal complaints.  She was evaluated at bedside earlier this morning with some mild nausea, but no other significant symptomatology noted. Patient was admitted with some AKI,likely prerenal,from poor oral intake along with atrial fibrillation with RVR with marked improvement in the ED after 1 dose of IV metoprolol. She is on warfarin for anticoagulation. She is noted to have a periesophageal hiatal hernia on her chest x-ray.  Unfortunately, her symptoms began to worsen after she had lunch. She began to have significant nausea and vomiting as well as abdominal pain for which Zofran and Dilaudid were given. Rapid response was called on account of the fact that her blood pressures began to elevate significantly and she was becoming hypoxemic requiring 4 L nasal cannula. I had called Dr. Sloan Leiter who had evaluated patient at bedside and noted a benign abdominal examination with improving blood pressures by the time of his evaluation. It appears, that patient may have gagged on some of her food causing the symptoms. She is overall now feeling better and will be given Protonix as well as GI cocktail,which should help with her symptoms  I had ordered KUB earlier which had resulted with no signs of bowel obstruction at this time. Will need close monitoring through the evening and will maintain  on IV fluid with repeat labs in a.m. PT evaluation also pending for generalized weakness and poor balance. Repeat urine analysis with signs of pyuria, but no UTI identified.  The patient has had some difficulty with hallucinations. It is thought that is due to dementia and anxiety. Psychiatry was consulted and they have recommended Seroquel 25 mg bid prn.  The patient is complaining of bilateral lower extremity weakness. CT of the lumbar spine was ordered, but the patient has been unable to comply with the study.  Consultants  . Psychiatry  Procedures  . None  Antibiotics   Anti-infectives (From admission, onward)   None     .   Subjective  The patient is resting comfortably. No new complaints.  Objective   Vitals:  Vitals:   01/19/19 0623 01/19/19 0811  BP: (!) 176/88 (!) 142/75  Pulse: 87 88  Resp: 20 18  Temp: (!) 97.3 F (36.3 C)   SpO2: 95%     Exam:  Constitutional:  . The patient is awake and alert. She is in no acute distress.  Respiratory:  . No increased work of breathing.  . No wheezes, rales, or rhonchi. . No tactile fremitus. Cardiovascular:  . Regular, rate and rhythm. . No murmurs, ectopy, or gallups. . No lateral PMI. No thrills./ Abdomen:  . Abdomen is soft, non-tender, non-distended. . No hernias, masses, or organomegaly. . Normoactive bowel sounds.  Musculoskeletal:  . No cyanosis, clubbing, or edema. Skin:  . No rashes, lesions, ulcers . palpation of skin: no induration or nodules Neurologic:  . CN 2-12 intact . Sensation all 4 extremities intact . Pt complains of pain and  weakness in legs bilaterally.  Psychiatric:  . Mental status o Mood, affect appropriate o Orientation to person, place, time  . judgment and insight appear intact    I have personally reviewed the following:   Today's Data  . Vitals, CBC, BMP  Scheduled Meds: . docusate sodium  100 mg Oral BID  . famotidine  20 mg Oral Daily  . levothyroxine  88 mcg  Oral QAC breakfast  . metoprolol tartrate  50 mg Oral BID WC  . vitamin B-12  1,000 mcg Oral Daily  . warfarin  5 mg Oral ONCE-1800  . Warfarin - Pharmacist Dosing Inpatient   Does not apply q1800   Continuous Infusions:   Principal Problem:   Delirium Active Problems:   Permanent atrial fibrillation   Essential hypertension, benign   Long term current use of anticoagulant therapy   Chronic diastolic CHF (congestive heart failure) ([facility])   Hypothyroidism   AKI (acute kidney injury) ([place])   LOS: 4 days   A & P  Acute encephalopathy with hallucinations-improved. Appreciate psychiatry evaluation on 8/3 with likely delirium superimposed on dementia. We will plan to treat AKI as well as UTI. Seroquel ordered twice a day as needed for hallucinations. No need for further inpatient psychiatric evaluation otherwise noted.  Abdominal pain with nausea and vomiting: Resolved with GI cocktail following an episode of difficulty with gagging. She is receiving a soft diet.  Also noted to have periesophageal hernia which is chronic and may require further work-up in the outpatient setting.  Will likely require endoscopy with GI outpatient to evaluate.  No need for this inpatient, if she is tolerating soft diet.  AKI: prerenal, nonoliguric-improving (baseline 0.8). Continues to have elevated creatinine level, but improved to 0.93 and likely near baseline. Continue IV fluid and recheck labs in a.m. Continue monitoring I's and O's. Avoid nephrotoxic agents.  Mild hypokalemia: Supplement and Monitor.  Bilateral Lower extremity weakness: Pt complains of pain and weakness in both legs sometimes with numbness. She denies difficulty with continence of bowel and bladder. The patient has been unable to comply with CT lumbar spine that was ordered to investigate this problem. She has been evaluated by PT. They recommend return to SNF and the use of a rolling walker.  Atrial fibrillation with RVR in the  setting of permanent atrial fibrillation: Rate controlled on home p.o. metoprolol. The patient is on  Warfarin for anticoagulation managed by pharmacy,INR currently 3.5. Warfarin being held by pharmacy again today.  Hypertension: Blood pressures are well controlled on metoprololContinue home regimen  Hypothyroidism: Continue Synthroid  Pyuria with likely UTI: Urine culture with multiple species. Given 1 dose of fosfomycin on 8/3.  Generalized weakness: PT recommending SNF on discharge which is appropriate.  I have seen and examined this patient myself. I have spent 32 minutes in her evaluation and care.  DVT prophylaxis: Warfarin Code Status: Full Code Family Communication: None available Disposition Plan: SNF  Yanet Balliet, DO [facility] Direct contact: see [email or web address]  7PM-7AM contact night coverage as above 01/18/2019, 4:18 PM  LOS: 2 days

## 2019-01-19 NOTE — TOC Transition Note (Signed)
Transition of Care [facility]) - CM/SW Discharge Note 01/19/19 - Discharged to [facility] via ambulance    Patient Details  Name: Charlotte Castro MRN: [id number] Date of Birth: 19-Aug-1932  Transition of [facility]) CM/SW Contact:  Sable Feil, LCSW Phone Number: 01/19/2019, 6:10 PM   Clinical Narrative:  Tammy, admissions liaison with [facility] contacted regarding patient's readiness for discharge and discharge clinicals transmitted to facility. Transport arranged and patient advised of ambulance transport. When asked, Ms. Shuffler informed CSW that she had already contacted her children regarding her discharge.    Final next level of care: Skilled Nursing Facility Barriers to Discharge: No Barriers Identified   Patient Goals and CMS Choice Patient states their goals for this hospitalization and ongoing recovery are:: Information not provided by patient CMS Medicare.gov Compare Post Acute Care list provided to:: Patient Choice offered to / list presented to : Patient  Discharge Placement - [place] number confirmed : 01/15/19          Patient chooses bed at: ([facility]) Patient to be transferred to facility by: Ambulance Name of family member notified: [facility] and [employer]. Per patient at bedside, she had already contacted her children, when asked about family being contacted. Patient and family notified of of transfer: 01/19/19  Discharge Plan and Services In-house Referral: NA   Post Acute Care Choice: NA                    HH Arranged: NA          Social Determinants of Health (SDOH) Interventions  No SDOH interventions needed at discharge.   Readmission Risk Interventions No flowsheet data found.

## 2019-01-20 LAB — BASIC METABOLIC PANEL
Anion gap: 9 (ref 5–15)
BUN: 20 mg/dL (ref 8–23)
CO2: 23 mmol/L (ref 22–32)
Calcium: 9.2 mg/dL (ref 8.9–10.3)
Chloride: 107 mmol/L (ref 98–111)
Creatinine, Ser: 1.06 mg/dL — ABNORMAL HIGH (ref 0.44–1.00)
GFR calc Af Amer: 56 mL/min — ABNORMAL LOW (ref >60)
GFR calc non Af Amer: 48 mL/min — ABNORMAL LOW (ref >60)
Glucose, Bld: 117 mg/dL — ABNORMAL HIGH (ref 70–99)
Potassium: 3.8 mmol/L (ref 3.5–5.1)
Sodium: 139 mmol/L (ref 135–145)

## 2019-01-20 LAB — PROTIME-INR
INR: 1.6 — ABNORMAL HIGH (ref 0.8–1.2)
Prothrombin Time: 18.4 seconds — ABNORMAL HIGH (ref 11.4–15.2)

## 2019-01-20 LAB — TROPONIN I (HIGH SENSITIVITY)
Troponin I (High Sensitivity): 13 ng/L (ref <18)
Troponin I (High Sensitivity): 14 ng/L (ref <18)

## 2019-01-20 NOTE — ED Notes (Signed)
Per RN Annie Main wait 1 hr to draw repeat troponin due to time of last troponin resulting

## 2019-01-20 NOTE — Discharge Instructions (Signed)
Your results tonight are reassuring.  You are cleared by the ER to return to the skilled nursing facility.

## 2019-01-20 NOTE — ED Notes (Signed)
Ptar called for pt 

## 2019-01-25 ENCOUNTER — Ambulatory Visit: Payer: Medicare Other | Admitting: Radiation Oncology

## 2019-01-27 ENCOUNTER — Encounter (HOSPITAL_COMMUNITY): Payer: Self-pay | Admitting: Emergency Medicine

## 2019-01-27 ENCOUNTER — Inpatient Hospital Stay (HOSPITAL_COMMUNITY)
Admission: EM | Admit: 2019-01-27 | Discharge: 2019-01-31 | DRG: 392 | Disposition: A | Discharge status: Skilled Nursing Facility | Payer: Medicare Other | Attending: Internal Medicine | Admitting: Internal Medicine

## 2019-01-27 ENCOUNTER — Other Ambulatory Visit: Payer: Self-pay

## 2019-01-27 ENCOUNTER — Emergency Department (HOSPITAL_COMMUNITY): Payer: Medicare Other

## 2019-01-27 DIAGNOSIS — K219 Gastro-esophageal reflux disease without esophagitis: Secondary | ICD-10-CM | POA: Diagnosis present

## 2019-01-27 DIAGNOSIS — E785 Hyperlipidemia, unspecified: Secondary | ICD-10-CM | POA: Diagnosis present

## 2019-01-27 DIAGNOSIS — Z20828 Contact with and (suspected) exposure to other viral communicable diseases: Secondary | ICD-10-CM | POA: Diagnosis present

## 2019-01-27 DIAGNOSIS — Z6829 Body mass index (BMI) 29.0-29.9, adult: Secondary | ICD-10-CM

## 2019-01-27 DIAGNOSIS — E669 Obesity, unspecified: Secondary | ICD-10-CM | POA: Diagnosis present

## 2019-01-27 DIAGNOSIS — R109 Unspecified abdominal pain: Secondary | ICD-10-CM

## 2019-01-27 DIAGNOSIS — E039 Hypothyroidism, unspecified: Secondary | ICD-10-CM | POA: Diagnosis present

## 2019-01-27 DIAGNOSIS — I480 Paroxysmal atrial fibrillation: Secondary | ICD-10-CM | POA: Diagnosis present

## 2019-01-27 DIAGNOSIS — I5032 Chronic diastolic (congestive) heart failure: Secondary | ICD-10-CM | POA: Diagnosis present

## 2019-01-27 DIAGNOSIS — I4892 Unspecified atrial flutter: Secondary | ICD-10-CM | POA: Diagnosis present

## 2019-01-27 DIAGNOSIS — Z79890 Hormone replacement therapy (postmenopausal): Secondary | ICD-10-CM

## 2019-01-27 DIAGNOSIS — Z880 Allergy status to penicillin: Secondary | ICD-10-CM

## 2019-01-27 DIAGNOSIS — R1110 Vomiting, unspecified: Secondary | ICD-10-CM

## 2019-01-27 DIAGNOSIS — Z8542 Personal history of malignant neoplasm of other parts of uterus: Secondary | ICD-10-CM

## 2019-01-27 DIAGNOSIS — K449 Diaphragmatic hernia without obstruction or gangrene: Principal | ICD-10-CM

## 2019-01-27 DIAGNOSIS — F419 Anxiety disorder, unspecified: Secondary | ICD-10-CM | POA: Diagnosis present

## 2019-01-27 DIAGNOSIS — Z91041 Radiographic dye allergy status: Secondary | ICD-10-CM

## 2019-01-27 DIAGNOSIS — I110 Hypertensive heart disease with heart failure: Secondary | ICD-10-CM | POA: Diagnosis present

## 2019-01-27 DIAGNOSIS — Z7901 Long term (current) use of anticoagulants: Secondary | ICD-10-CM

## 2019-01-27 DIAGNOSIS — N3000 Acute cystitis without hematuria: Secondary | ICD-10-CM

## 2019-01-27 DIAGNOSIS — B9689 Other specified bacterial agents as the cause of diseases classified elsewhere: Secondary | ICD-10-CM | POA: Diagnosis present

## 2019-01-27 DIAGNOSIS — R1013 Epigastric pain: Secondary | ICD-10-CM | POA: Diagnosis not present

## 2019-01-27 DIAGNOSIS — Z8249 Family history of ischemic heart disease and other diseases of the circulatory system: Secondary | ICD-10-CM

## 2019-01-27 DIAGNOSIS — G8929 Other chronic pain: Secondary | ICD-10-CM | POA: Diagnosis present

## 2019-01-27 DIAGNOSIS — Z881 Allergy status to other antibiotic agents status: Secondary | ICD-10-CM

## 2019-01-27 DIAGNOSIS — R112 Nausea with vomiting, unspecified: Secondary | ICD-10-CM | POA: Diagnosis not present

## 2019-01-27 DIAGNOSIS — I4821 Permanent atrial fibrillation: Secondary | ICD-10-CM

## 2019-01-27 DIAGNOSIS — M810 Age-related osteoporosis without current pathological fracture: Secondary | ICD-10-CM | POA: Diagnosis present

## 2019-01-27 DIAGNOSIS — M545 Low back pain: Secondary | ICD-10-CM | POA: Diagnosis present

## 2019-01-27 LAB — CBC
HCT: 42.2 % (ref 36.0–46.0)
Hemoglobin: 14.9 g/dL (ref 12.0–15.0)
MCH: 33 pg (ref 26.0–34.0)
MCHC: 35.3 g/dL (ref 30.0–36.0)
MCV: 93.6 fL (ref 80.0–100.0)
Platelets: 227 10*3/uL (ref 150–400)
RBC: 4.51 MIL/uL (ref 3.87–5.11)
RDW: 13.8 % (ref 11.5–15.5)
WBC: 8.9 10*3/uL (ref 4.0–10.5)
nRBC: 0 % (ref 0.0–0.2)

## 2019-01-27 LAB — COMPREHENSIVE METABOLIC PANEL
ALT: 15 U/L (ref 0–44)
AST: 23 U/L (ref 15–41)
Albumin: 3.6 g/dL (ref 3.5–5.0)
Alkaline Phosphatase: 61 U/L (ref 38–126)
Anion gap: 12 (ref 5–15)
BUN: 20 mg/dL (ref 8–23)
CO2: 25 mmol/L (ref 22–32)
Calcium: 10 mg/dL (ref 8.9–10.3)
Chloride: 104 mmol/L (ref 98–111)
Creatinine, Ser: 1.15 mg/dL — ABNORMAL HIGH (ref 0.44–1.00)
GFR calc Af Amer: 51 mL/min — ABNORMAL LOW (ref >60)
GFR calc non Af Amer: 44 mL/min — ABNORMAL LOW (ref >60)
Glucose, Bld: 112 mg/dL — ABNORMAL HIGH (ref 70–99)
Potassium: 4.4 mmol/L (ref 3.5–5.1)
Sodium: 141 mmol/L (ref 135–145)
Total Bilirubin: 1.4 mg/dL — ABNORMAL HIGH (ref 0.3–1.2)
Total Protein: 6.7 g/dL (ref 6.5–8.1)

## 2019-01-27 LAB — LIPASE, BLOOD: Lipase: 37 U/L (ref 11–51)

## 2019-01-27 LAB — URINALYSIS, ROUTINE W REFLEX MICROSCOPIC
Bilirubin Urine: NEGATIVE
Glucose, UA: NEGATIVE mg/dL
Ketones, ur: NEGATIVE mg/dL
Nitrite: NEGATIVE
Protein, ur: 100 mg/dL — AB
Specific Gravity, Urine: 1.021 (ref 1.005–1.030)
WBC, UA: >50 WBC/hpf — ABNORMAL HIGH (ref 0–5)
pH: 6 (ref 5.0–8.0)

## 2019-01-27 LAB — PROTIME-INR
INR: 2.3 — ABNORMAL HIGH (ref 0.8–1.2)
Prothrombin Time: 25.2 seconds — ABNORMAL HIGH (ref 11.4–15.2)

## 2019-01-27 LAB — SARS CORONAVIRUS 2 BY RT PCR (HOSPITAL ORDER, PERFORMED IN ~~LOC~~ HOSPITAL LAB): SARS Coronavirus 2: NEGATIVE

## 2019-01-27 LAB — MRSA PCR SCREENING: MRSA by PCR: NEGATIVE

## 2019-01-27 IMAGING — CT CT ABDOMEN AND PELVIS WITHOUT CONTRAST
2 of 4 series · 16 of 46 positions shown, 18 images · non-contrast
Comparison: [DATE]

CLINICAL DATA: Evaluate for bowel obstruction. Pain after eating.
Emesis yesterday.

EXAM:
CT ABDOMEN AND PELVIS WITHOUT CONTRAST
TECHNIQUE: Multidetector CT imaging of the abdomen and pelvis was performed
following the standard protocol without IV contrast.

[Series 3: a/p w/o 5mm · axial · non-contrast · 0.98mm/px · z∈[+697,+1132]mm · 13 of 95 slices shown, 15 images]
[im 4/95  soft-tissue]
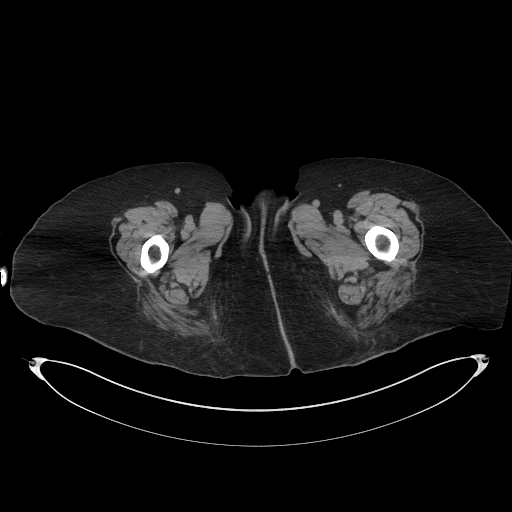
[im 4/95  bone]
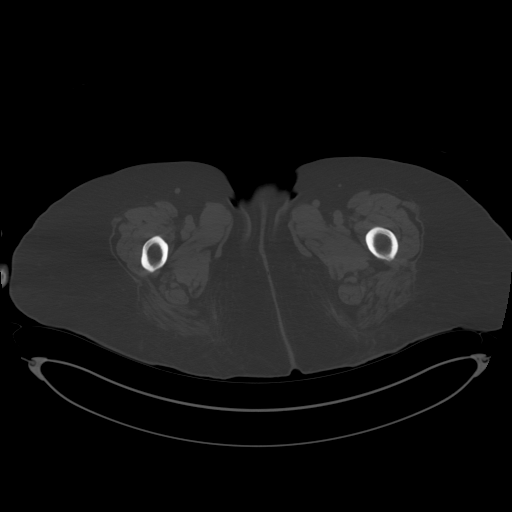
[im 11/95  soft-tissue]
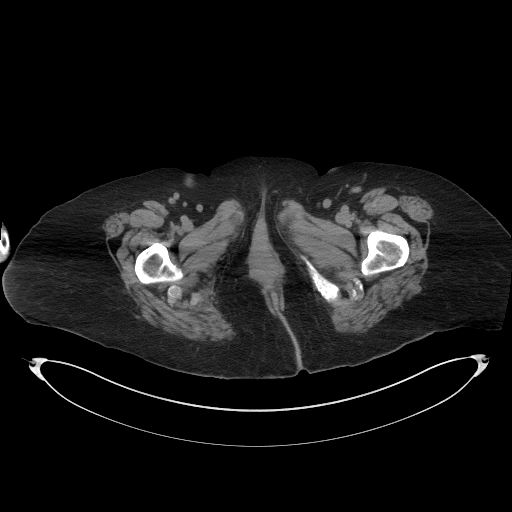
[im 19/95  soft-tissue]
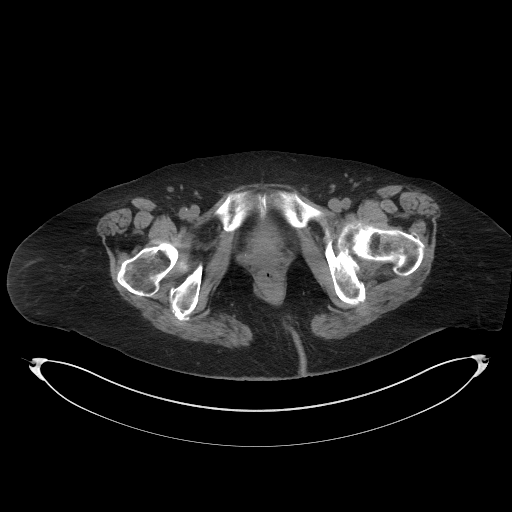
[im 26/95  soft-tissue]
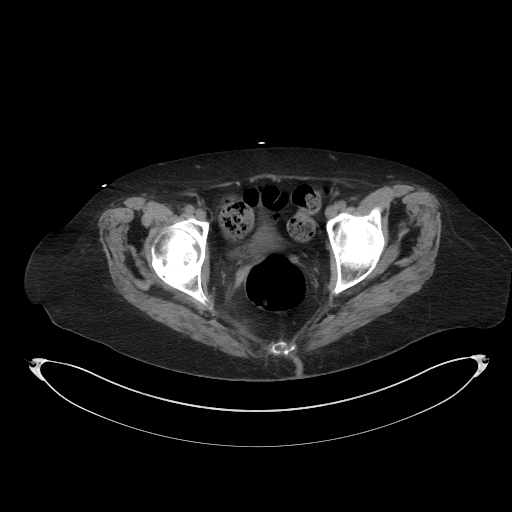
[im 33/95  soft-tissue]
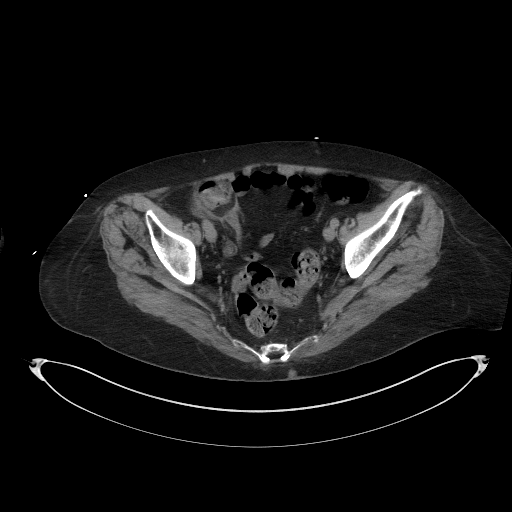
[im 40/95  soft-tissue]
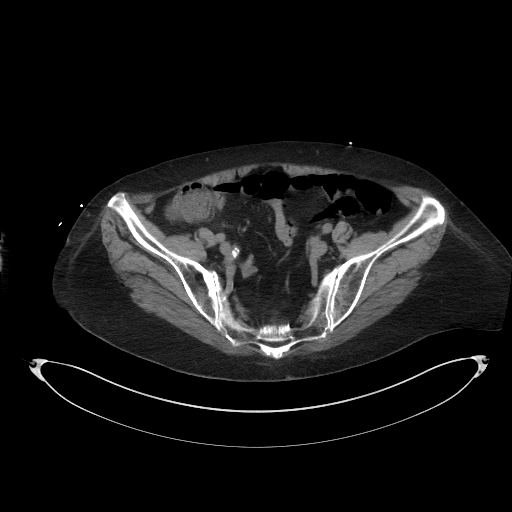
[im 48/95  soft-tissue]
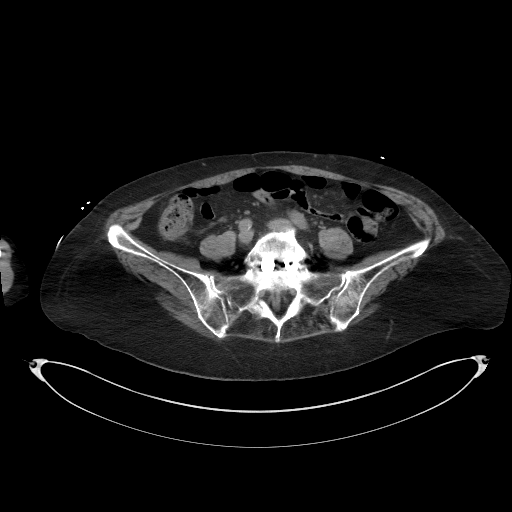
[im 55/95  soft-tissue]
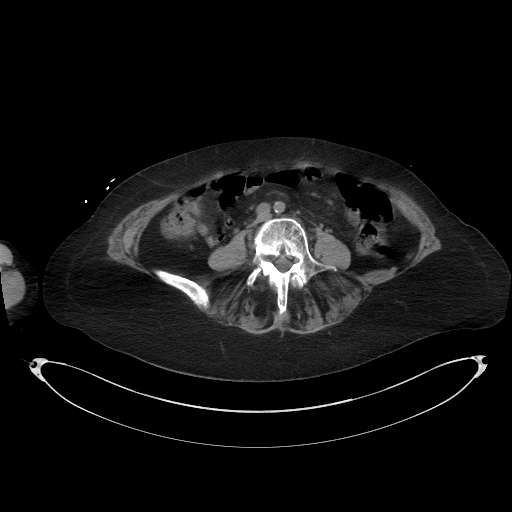
[im 62/95  soft-tissue]
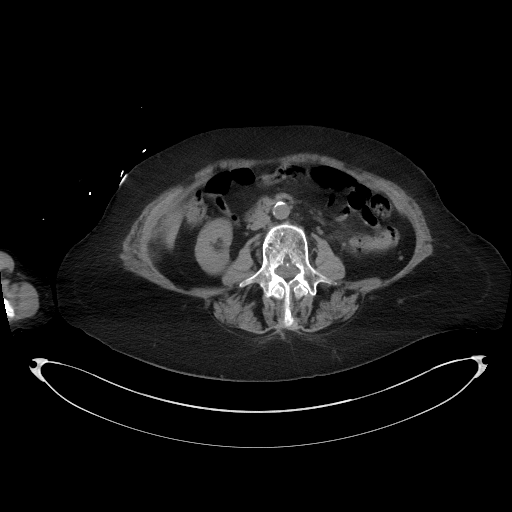
[im 62/95  bone]
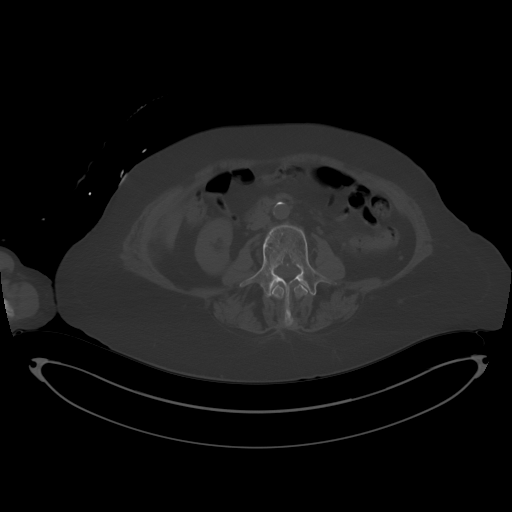
[im 69/95  soft-tissue]
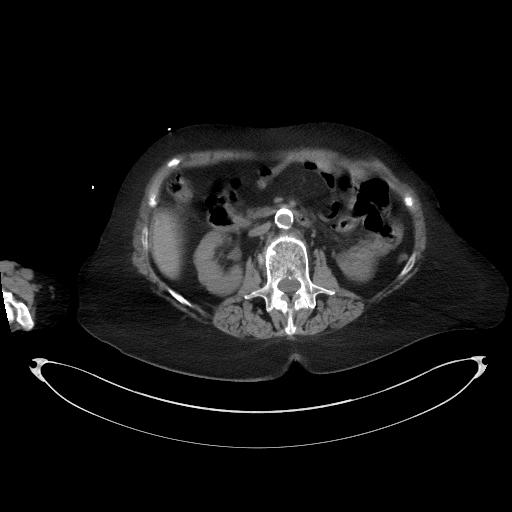
[im 76/95  soft-tissue]
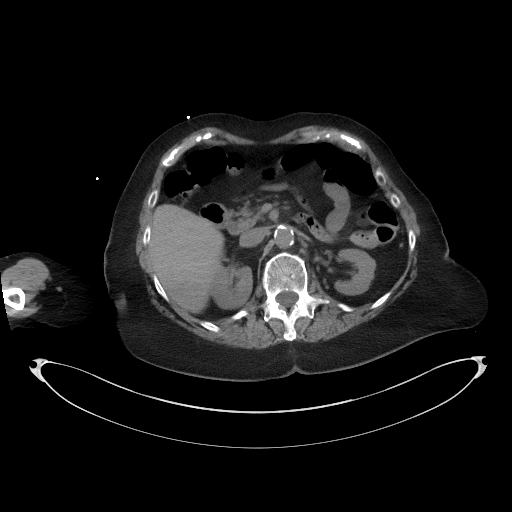
[im 84/95  soft-tissue]
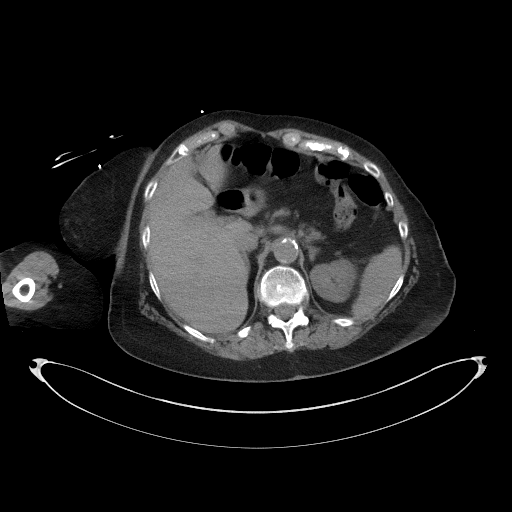
[im 91/95  soft-tissue]
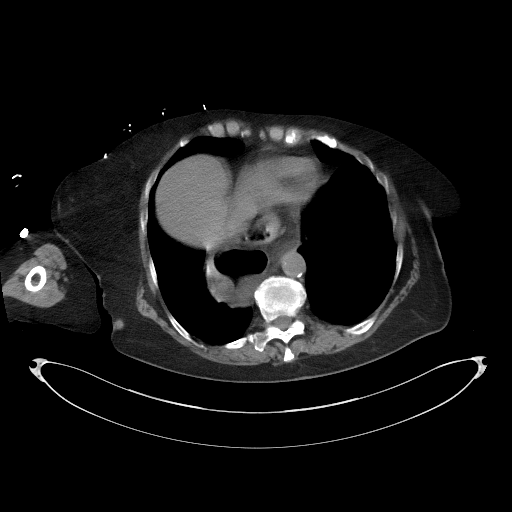

[Series 6: a/p w/o cor · coronal · non-contrast · 0.92mm/px · 3 of 145 slices shown]
[im 49/145  soft-tissue]
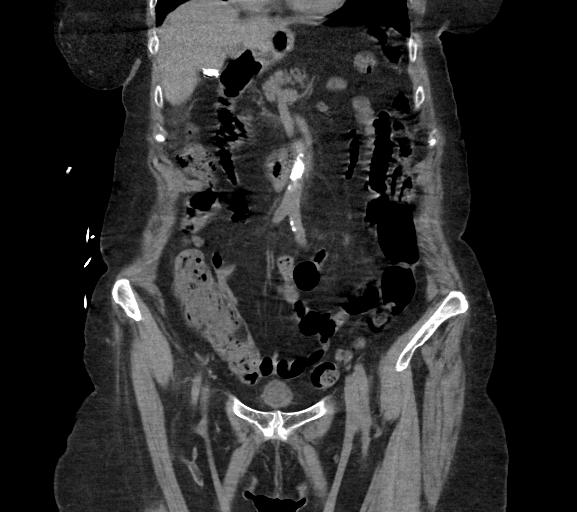
[im 65/145  soft-tissue]
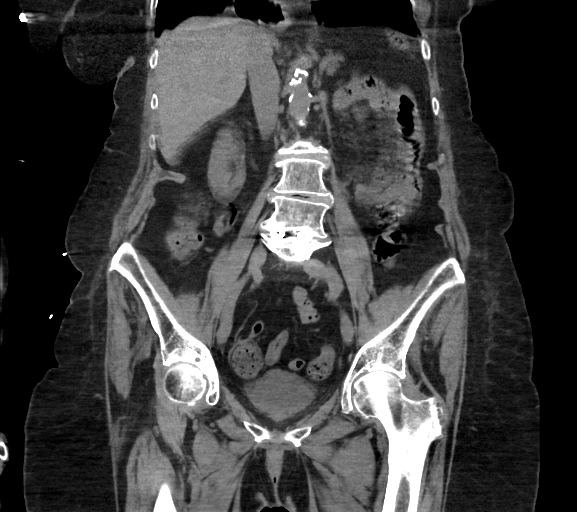
[im 81/145  soft-tissue]
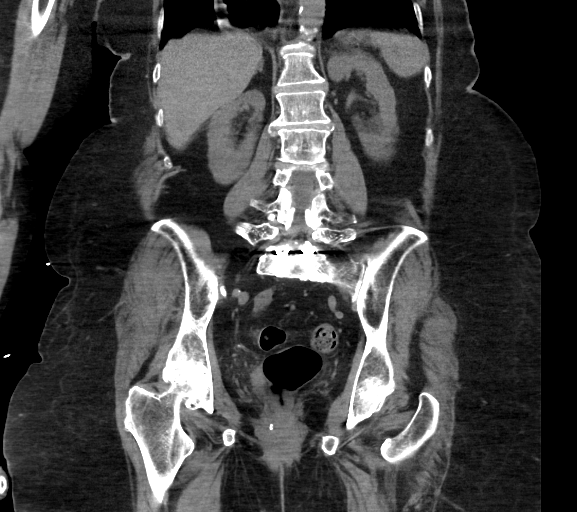

[16 of 46 positions shown; findings below may reference images not displayed]

FINDINGS: Lower chest: No acute abnormality. Moderate size hiatal hernia
identified with greater than 50% intrathoracic stomach.

Hepatobiliary: No liver abnormality. Previous cholecystectomy. Mild
fusiform dilatation of the CBD measures up to 1.2 cm.

Pancreas: Unremarkable. No pancreatic ductal dilatation or
surrounding inflammatory changes.

Spleen: Normal in size without focal abnormality.

Adrenals/Urinary Tract: Normal appearance of the adrenal glands. No
kidney mass, kidney stone or hydronephrosis identified. Urinary
bladder appears normal.

Stomach/Bowel: No evidence of bowel wall thickening, distension or
inflammation. The appendix is visualized and appears normal. No
pathologic dilatation of the colon.

Vascular/Lymphatic: Aortic atherosclerosis. No aneurysm. No
abdominal or pelvic adenopathy. No inguinal adenopathy.

Reproductive: Status post hysterectomy. No adnexal masses.

Other: No abdominal wall hernia or abnormality. No abdominopelvic
ascites.

Musculoskeletal: No acute or significant osseous findings. Chronic
postoperative changes from interbody fusion L4-5 and L5-S1 with
posterior decompression noted.
IMPRESSION: 1. No evidence for bowel obstruction.
2. Large hiatal hernia
3.  Aortic Atherosclerosis ([34]-[34]).
4. Status post cholecystectomy with increase caliber of the CBD.

## 2019-01-27 MED ORDER — WARFARIN - PHARMACIST DOSING INPATIENT: Freq: Every day | Status: DC

## 2019-01-27 MED ORDER — MORPHINE SULFATE (PF) 2 MG/ML IV SOLN: 1.0000 mg | INTRAVENOUS | Status: DC | PRN

## 2019-01-27 MED ORDER — ONDANSETRON HCL 4 MG/2ML IJ SOLN
4.0000 mg | Freq: Four times a day (QID) | INTRAMUSCULAR | Status: DC | PRN
  Administered 2019-01-29 – 2019-01-30 (×2): 4 mg via INTRAVENOUS
  Filled 2019-01-27 (×2): qty 2

## 2019-01-27 MED ORDER — METOPROLOL TARTRATE 50 MG PO TABS
50.0000 mg | ORAL_TABLET | Freq: Two times a day (BID) | ORAL | Status: DC
  Administered 2019-01-28 – 2019-01-31 (×8): 50 mg via ORAL
  Filled 2019-01-27 (×8): qty 1

## 2019-01-27 MED ORDER — QUETIAPINE FUMARATE 25 MG PO TABS: 25.0000 mg | ORAL_TABLET | Freq: Two times a day (BID) | ORAL | Status: DC | PRN

## 2019-01-27 MED ORDER — HYDROCODONE-ACETAMINOPHEN 5-325 MG PO TABS: 1.0000 | ORAL_TABLET | ORAL | Status: DC | PRN

## 2019-01-27 MED ORDER — ONDANSETRON HCL 4 MG/2ML IJ SOLN
4.0000 mg | Freq: Once | INTRAMUSCULAR | Status: AC | PRN
  Administered 2019-01-27: 4 mg via INTRAVENOUS
  Filled 2019-01-27: qty 2

## 2019-01-27 MED ORDER — LEVOTHYROXINE SODIUM 88 MCG PO TABS
88.0000 ug | ORAL_TABLET | Freq: Every day | ORAL | Status: DC
  Administered 2019-01-28 – 2019-01-31 (×4): 88 ug via ORAL
  Filled 2019-01-27 (×4): qty 1

## 2019-01-27 MED ORDER — ONDANSETRON HCL 4 MG PO TABS: 4.0000 mg | ORAL_TABLET | Freq: Four times a day (QID) | ORAL | Status: DC | PRN

## 2019-01-27 MED ORDER — CLONAZEPAM 0.5 MG PO TABS
0.5000 mg | ORAL_TABLET | Freq: Three times a day (TID) | ORAL | Status: DC | PRN
  Administered 2019-01-27: 0.5 mg via ORAL
  Filled 2019-01-27: qty 1

## 2019-01-27 MED ORDER — DEXTROSE-NACL 5-0.45 % IV SOLN
INTRAVENOUS | Status: DC
  Administered 2019-01-27 – 2019-01-31 (×4): via INTRAVENOUS

## 2019-01-27 MED ORDER — ACETAMINOPHEN ER 650 MG PO TBCR: 650.0000 mg | EXTENDED_RELEASE_TABLET | Freq: Every morning | ORAL | Status: DC

## 2019-01-27 MED ORDER — WARFARIN SODIUM 5 MG PO TABS
5.0000 mg | ORAL_TABLET | Freq: Once | ORAL | Status: AC
  Administered 2019-01-27: 5 mg via ORAL
  Filled 2019-01-27: qty 1

## 2019-01-27 MED ORDER — SULFAMETHOXAZOLE-TRIMETHOPRIM 800-160 MG PO TABS
1.0000 | ORAL_TABLET | Freq: Once | ORAL | Status: AC
  Administered 2019-01-27: 1 via ORAL
  Filled 2019-01-27: qty 1

## 2019-01-27 MED ORDER — FENTANYL CITRATE (PF) 100 MCG/2ML IJ SOLN
50.0000 ug | Freq: Once | INTRAMUSCULAR | Status: AC | PRN
  Administered 2019-01-27: 50 ug via INTRAVENOUS
  Filled 2019-01-27: qty 2

## 2019-01-27 MED ORDER — SODIUM CHLORIDE 0.9 % IV SOLN
1.0000 g | INTRAVENOUS | Status: DC
  Administered 2019-01-27 – 2019-01-29 (×3): 1 g via INTRAVENOUS
  Filled 2019-01-27 (×3): qty 1
  Filled 2019-01-27: qty 10

## 2019-01-27 MED ORDER — SODIUM CHLORIDE 0.9% FLUSH: 3.0000 mL | Freq: Once | INTRAVENOUS | Status: DC

## 2019-01-27 NOTE — Progress Notes (Signed)
   01/27/19 1900  Clinical Encounter Type  Visited With Patient;Health care provider  Visit Type Initial;ED  Consult/Referral To Nurse  Spiritual Encounters  Spiritual Needs Emotional  Stress Factors  Patient Stress Factors Loss of control;Health changes;Lack of knowledge   Pt spoke w/ me when I walked by, wanted to use bathroom, I explained that I was not trained to do so, but would find someone who could.  Was able to get a nurse.  Empathetic listening to pt's lament about her experiences today.  Asked again for me to take her to bathroom and I explained that I could not do so, but that the nurse had gone to get a wheelchair for her to do so.  Myra Gianotti resident, [phone]

## 2019-01-27 NOTE — Progress Notes (Signed)
ANTICOAGULATION CONSULT NOTE - Initial Consult  Pharmacy Consult for warfarin Indication: atrial fibrillation  Allergies  Allergen Reactions  . Iodinated Diagnostic Agents Shortness Of Breath    Causes headaches, also  . Zosyn [Piperacillin Sod-Tazobactam So] Other (See Comments)    On MAR  . Penicillins Other (See Comments)    Tolerated Zosyn Oct 2018- Patient is unaware this is an "allergy" Did it involve swelling of the face/tongue/throat, SOB, or low BP? Unk Did it involve sudden or severe rash/hives, skin peeling, or any reaction on the inside of your mouth or nose? Unk Did you need to seek medical attention at a hospital or doctor's office? Unk When did it last happen? Unk If all above answers are "NO", may proceed with cephalosporin use.    Vital Signs: Temp: 97.3 F (36.3 C) (08/15 0831) BP: 127/64 (08/15 0945) Pulse Rate: 85 (08/15 0945)  Labs: Recent Labs    01/27/19 0854 01/27/19 0941  HGB 14.9  --   HCT 42.2  --   PLT 227  --   LABPROT  --  25.2*  INR  --  2.3*  CREATININE 1.15*  --     Estimated Creatinine Clearance: 43.4 mL/min (A) (by C-G formula based on SCr of 1.15 mg/dL (H)).   Medical History: Past Medical History:  Diagnosis Date  . Anemia    history of  . Anxiety   . Arthritis    "left knee" (04/05/2017)  . Atrial fibrillation ([place])   . Atrial flutter ([place])    typical appearing  . Atrial tachycardia ([place])    ablated 11/17/10  by JA  from the [facility] of the aorta  . Chest pain on exertion   . CHF (congestive heart failure) ([place])   . Chronic lower back pain   . Chronic nausea   . Dizziness    chronic and of an unclear etiology  . Dyspnea   . Endometrial cancer (HCC)    grade 1  . Gallstone pancreatitis   . Gastritis   . GERD (gastroesophageal reflux disease)   . History of blood transfusion ~ 1948  . History of hiatal hernia   . HTN (hypertension)   . Hyperlipemia   . Hypothyroidism   . Left leg numbness   . Obesity   .  Osteoporosis   . PMB (postmenopausal bleeding)   . Sinus headache   . Toe ulcer ([place])    left 3rd toe  . Vitamin D deficiency     Medications:  Scheduled:  . sodium chloride flush  3 mL Intravenous Once    Assessment: 68 yoF presents to ED with abdominal pain. On warfarin 5 mg daily PTA for Afib. Last dose taken 8/14 @1800 . INR on admission is therapeutic at 2.3. Pt received 1 dose of Bactrim in the ED for UTI. Pharmacy consulted to dose warfarin.  CBC wnl, Hgb 14.9. Plt 227. No bleeding noted.    Goal of Therapy:  INR 2-3 Monitor platelets by anticoagulation protocol: Yes   Plan:  Warfarin 5 mg tablet x 1 dose today Monitor daily PT/INR, s/sx bleeding  Charlotte Castro Z  Karita Dralle, 01/27/2019,1:59 PM

## 2019-01-27 NOTE — ED Triage Notes (Addendum)
Pt here from [place]. A&OX4. Pt had CT and noted have a bowel obstruction. VSS. Pt states she has no abdominal pain presently but has pain only after eating. Pt reports emesis yesterday that was "thick and brown"

## 2019-01-27 NOTE — Consult Note (Signed)
Referring Provider:  Dr. Mariah Milling Primary Care Physician:  Isaac Bliss, Rayford Halsted, MD Primary Gastroenterologist:  Dr. Oletta Lamas  Reason for Consultation: Nausea and vomiting, dilated bile duct, large hiatal hernia  HPI: Charlotte Castro is a 85 y.o. female brought to the emergency room from the skilled nursing facility where she resides, and being admitted because of a several day history of postprandial upper abdominal pain with nausea and vomiting.  The symptoms seem to subside between meals and did not bother her at night.  The patient is known to our service from 2 years ago when she was seen in the hospital for pancreatitis, attributed possibly to gallstones, and who was doing fine when checked in follow-up in the office.  She is known to have a large hiatal hernia with essentially an intrathoracic stomach, probably about 40% of the stomach up in the chest.  This was noted on CT scanning as far back as 3 years ago.  Lab work in the emergency room showed normal liver chemistries, lipase, and white count.  CT in the emergency room showed the above-mentioned large hiatal hernia and no evidence of bowel obstruction.  However, dilation of the CBD to 1.2 cm was noted.  So far today in the emergency room, she has not had any further nausea and vomiting, but apparently she just had a very small amount of oral intake this morning.  Past Medical History:  Diagnosis Date  . Anemia    history of  . Anxiety   . Arthritis    "left knee" (04/05/2017)  . Atrial fibrillation ([place])   . Atrial flutter ([place])    typical appearing  . Atrial tachycardia ([place])    ablated 11/17/10  by JA  from the [facility] of the aorta  . Chest pain on exertion   . CHF (congestive heart failure) ([place])   . Chronic lower back pain   . Chronic nausea   . Dizziness    chronic and of an unclear etiology  . Dyspnea   . Endometrial cancer (HCC)    grade 1  . Gallstone pancreatitis   . Gastritis   . GERD (gastroesophageal  reflux disease)   . History of blood transfusion ~ 1948  . History of hiatal hernia   . HTN (hypertension)   . Hyperlipemia   . Hypothyroidism   . Left leg numbness   . Obesity   . Osteoporosis   . PMB (postmenopausal bleeding)   . Sinus headache   . Toe ulcer ([place])    left 3rd toe  . Vitamin D deficiency     Past Surgical History:  Procedure Laterality Date  . ATRIAL ABLATION SURGERY  11/17/10   Atrial tachycardia arising from [facility] of the aorta ablated by JA  . BACK SURGERY    . CARDIAC CATHETERIZATION  01/11/2011   Archie Endo 01/12/2011  . CHOLECYSTECTOMY N/A 04/08/2017   Procedure: LAPAROSCOPIC CHOLECYSTECTOMY;  Surgeon: Georganna Skeans, MD;  Location: [place];  Service: General;  Laterality: N/A;  . FRACTURE SURGERY    . HYSTEROSCOPY W/D&C N/A 08/05/2017   Procedure: DILATATION AND CURETTAGE /HYSTEROSCOPY AND POLYPECTOMY;  Surgeon: Osborne Oman, MD;  Location: [facility];  Service: Gynecology;  Laterality: N/A;  . LUMBAR SPINE SURGERY  ~ 1998  . ROBOTIC ASSISTED TOTAL HYSTERECTOMY WITH BILATERAL SALPINGO OOPHERECTOMY Bilateral 09/20/2017   Procedure: XI ROBOTIC ASSISTED TOTAL HYSTERECTOMY WITH BILATERAL SALPINGO OOPHORECTOMY, SENTINAL LYMPH NODE BIOPSY;  Surgeon: Everitt Amber, MD;  Location: [facility];  Service: Gynecology;  Laterality: Bilateral;  . SHOULDER SURGERY Right 1984 X2   Rollene Rotunda 01/19/2011  . WRIST FRACTURE SURGERY Left 1983    Prior to Admission medications   Medication Sig Start Date End Date Taking? Authorizing Provider  acetaminophen (TYLENOL) 325 MG tablet Take 650 mg by mouth daily.   Yes [provider]  cyanocobalamin (,VITAMIN B-12,) 1000 MCG/ML injection Inject 1 ml once a week for 1 month. Then 1 ml once a month thereafter. Patient taking differently: Inject 1,000 mcg into the muscle See admin instructions. Inject 1,000 mcg IM every 7 days for four weeks 08/15/18  Yes Isaac Bliss, Rayford Halsted, MD  levothyroxine (SYNTHROID, LEVOTHROID) 88 MCG tablet Take 1  tablet (88 mcg total) by mouth daily before breakfast. 08/08/18  Yes Isaac Bliss, Rayford Halsted, MD  metoprolol tartrate (LOPRESSOR) 50 MG tablet TAKE ONE TABLET TWICE DAILY WITH A MEAL Patient taking differently: Take 50 mg by mouth 2 (two) times daily with a meal.  10/04/18  Yes Jettie Booze, MD  naproxen sodium (ALEVE) 220 MG tablet Take 220 mg by mouth 2 (two) times daily. FOR 5 DAYS 01/22/19 01/27/19 Yes [provider]  omeprazole (PRILOSEC) 40 MG capsule Take 40 mg by mouth daily.   Yes [provider]  ondansetron (ZOFRAN) 4 MG tablet Take 4 mg by mouth every 6 (six) hours as needed for nausea or vomiting (FOR 14 DAYS). 01/26/19 02/09/19 Yes [provider]  vitamin B-12 (CYANOCOBALAMIN) 1000 MCG tablet Take 500 mcg by mouth 2 (two) times daily.   Yes [provider]  warfarin (COUMADIN) 5 MG tablet TAKE 1/2 TO ONE TABLET DAILY AS DIRECTEDBY COUMADIN CLINIC Patient taking differently: Take 5 mg by mouth daily at 6 PM.  10/04/18  Yes Jettie Booze, MD  QUEtiapine (SEROQUEL) 25 MG tablet Take 1 tablet (25 mg total) by mouth 2 (two) times daily as needed (hallucinations). 01/18/19   Swayze, Ava, DO  SYRINGE-NEEDLE, DISP, 3 ML 25G X 1-1/2" 3 ML MISC Use as needed for B12 injections 08/15/18   Isaac Bliss, Rayford Halsted, MD    Current Facility-Administered Medications  Medication Dose Route Frequency Provider Last Rate Last Dose  . sodium chloride flush (NS) 0.9 % injection 3 mL  3 mL Intravenous Once Curatolo, Adam, DO      . warfarin (COUMADIN) tablet 5 mg  5 mg Oral ONCE-1800 Steen[place]k, Yujing Z, RPH      . Warfarin - Pharmacist Dosing Inpatient   Does not apply q1800 Mirian Capuchin, [facility]       Current Outpatient Medications  Medication Sig Dispense Refill  . acetaminophen (TYLENOL) 325 MG tablet Take 650 mg by mouth daily.    . cyanocobalamin (,VITAMIN B-12,) 1000 MCG/ML injection Inject 1 ml once a week for 1 month. Then 1 ml once a month  thereafter. (Patient taking differently: Inject 1,000 mcg into the muscle See admin instructions. Inject 1,000 mcg IM every 7 days for four weeks) 30 mL 0  . levothyroxine (SYNTHROID, LEVOTHROID) 88 MCG tablet Take 1 tablet (88 mcg total) by mouth daily before breakfast. 90 tablet 3  . metoprolol tartrate (LOPRESSOR) 50 MG tablet TAKE ONE TABLET TWICE DAILY WITH A MEAL (Patient taking differently: Take 50 mg by mouth 2 (two) times daily with a meal. ) 60 tablet 7  . naproxen sodium (ALEVE) 220 MG tablet Take 220 mg by mouth 2 (two) times daily. FOR 5 DAYS    . omeprazole (PRILOSEC) 40 MG capsule Take 40  mg by mouth daily.    . ondansetron (ZOFRAN) 4 MG tablet Take 4 mg by mouth every 6 (six) hours as needed for nausea or vomiting (FOR 14 DAYS).    . vitamin B-12 (CYANOCOBALAMIN) 1000 MCG tablet Take 500 mcg by mouth 2 (two) times daily.    Marland Kitchen warfarin (COUMADIN) 5 MG tablet TAKE 1/2 TO ONE TABLET DAILY AS DIRECTEDBY COUMADIN CLINIC (Patient taking differently: Take 5 mg by mouth daily at 6 PM. ) 30 tablet 2  . QUEtiapine (SEROQUEL) 25 MG tablet Take 1 tablet (25 mg total) by mouth 2 (two) times daily as needed (hallucinations). 20 tablet 0  . SYRINGE-NEEDLE, DISP, 3 ML 25G X 1-1/2" 3 ML MISC Use as needed for B12 injections 100 each 3    Allergies as of 01/27/2019 - Review Complete 01/27/2019  Allergen Reaction Noted  . Iodinated diagnostic agents Shortness Of Breath and Other (See Comments) 01/27/2011  . Zosyn [piperacillin sod-tazobactam so] Other (See Comments) 01/20/2019  . Penicillins Other (See Comments) 05/05/2016    Family History  Problem Relation Age of Onset  . Heart attack Father   . Hypertension Father     Social History   Socioeconomic History  . Marital status: Widowed    Spouse name: Not on file  . Number of children: 2  . Years of education: 21  . Highest education level: Not on file  Occupational History  . Not on file  Social Needs  . Financial resource strain:  Not on file  . Food insecurity    Worry: Not on file    Inability: Not on file  . Transportation needs    Medical: Not on file    Non-medical: Not on file  Tobacco Use  . Smoking status: Never Smoker  . Smokeless tobacco: Never Used  Substance and Sexual Activity  . Alcohol use: No  . Drug use: No  . Sexual activity: Never    Birth control/protection: Post-menopausal  Lifestyle  . Physical activity    Days per week: Not on file    Minutes per session: Not on file  . Stress: Not on file  Relationships  . Social Herbalist on phone: Not on file    Gets together: Not on file    Attends religious service: Not on file    Active member of club or organization: Not on file    Attends meetings of clubs or organizations: Not on file    Relationship status: Not on file  . Intimate partner violence    Fear of current or ex partner: Not on file    Emotionally abused: Not on file    Physically abused: Not on file    Forced sexual activity: Not on file  Other Topics Concern  . Not on file  Social History Narrative   Lives alone in a one story home.  Has 2 children.  Retired.  Education: high school.      Review of Systems: See history of present illness  Physical Exam: Vital signs in last 24 hours: Temp:  [97.3 F (36.3 C)] 97.3 F (36.3 C) (08/15 0831) Pulse Rate:  [82-104] 104 (08/15 1415) Resp:  [13-24] 13 (08/15 1415) BP: (109-132)/(52-96) 120/65 (08/15 1330) SpO2:  [98 %-100 %] 98 % (08/15 1415)   This is a pleasant, elderly but not particularly frail-appearing, pleasant Caucasian female who does not appear to be in any distress whatsoever.  She is anicteric and without pallor.  Chest is  clear, and heart is without murmur or arrhythmia.  Abdomen has normal bowel sounds and no distention, mass-effect, or tenderness.  No peripheral edema, no evident focal neurologic deficits, no evident or overt cognitive impairment.  Intake/Output from previous day: No  intake/output data recorded. Intake/Output this shift: No intake/output data recorded.  Lab Results: Recent Labs    01/27/19 0854  WBC 8.9  HGB 14.9  HCT 42.2  PLT 227   BMET Recent Labs    01/27/19 0854  NA 141  K 4.4  CL 104  CO2 25  GLUCOSE 112*  BUN 20  CREATININE 1.15*  CALCIUM 10.0   LFT Recent Labs    01/27/19 0854  PROT 6.7  ALBUMIN 3.6  AST 23  ALT 15  ALKPHOS 61  BILITOT 1.4*   PT/INR Recent Labs    01/27/19 0941  LABPROT 25.2*  INR 2.3*    Studies/Results: Ct Abdomen Pelvis Wo Contrast  Result Date: 01/27/2019 CLINICAL DATA:  Evaluate for bowel obstruction. Pain after eating. Emesis yesterday. EXAM: CT ABDOMEN AND PELVIS WITHOUT CONTRAST TECHNIQUE: Multidetector CT imaging of the abdomen and pelvis was performed following the standard protocol without IV contrast. COMPARISON:  04/05/2017 FINDINGS: Lower chest: No acute abnormality. Moderate size hiatal hernia identified with greater than 50% intrathoracic stomach. Hepatobiliary: No liver abnormality. Previous cholecystectomy. Mild fusiform dilatation of the CBD measures up to 1.2 cm. Pancreas: Unremarkable. No pancreatic ductal dilatation or surrounding inflammatory changes. Spleen: Normal in size without focal abnormality. Adrenals/Urinary Tract: Normal appearance of the adrenal glands. No kidney mass, kidney stone or hydronephrosis identified. Urinary bladder appears normal. Stomach/Bowel: No evidence of bowel wall thickening, distension or inflammation. The appendix is visualized and appears normal. No pathologic dilatation of the colon. Vascular/Lymphatic: Aortic atherosclerosis. No aneurysm. No abdominal or pelvic adenopathy. No inguinal adenopathy. Reproductive: Status post hysterectomy. No adnexal masses. Other: No abdominal wall hernia or abnormality. No abdominopelvic ascites. Musculoskeletal: No acute or significant osseous findings. Chronic postoperative changes from interbody fusion L4-5 and  L5-S1 with posterior decompression noted. IMPRESSION: 1. No evidence for bowel obstruction. 2. Large hiatal hernia 3.  Aortic Atherosclerosis (ICD10-I70.0). 4. Status post cholecystectomy with increase caliber of the CBD. Electronically Signed   By: Kerby Moors M.D.   On: 01/27/2019 10:46    Impression: 1.  Nausea and vomiting and upper abdominal pain of unclear cause.  I wonder about possible intermittent torsion of her hiatal hernia.  2.  Dilated bile duct, just 2 years status post cholecystectomy, without elevation of liver chemistries to suggest biliary obstruction or cholangitis.  Plan: I would favor observation to make sure that the patient is able to tolerate reasonable oral intake before she is discharged.  If not, she may need an upper GI series as sort of a dynamic evaluation of the hiatal hernia and specifically whether it is posing an impediment to egress of ingested contents into the abdominal portion of the stomach.  If the patient remains symptomatic, one could consider doing an endoscopic ultrasound to make sure that there is not a small pancreatic mass or a common duct stone causing biliary obstruction, but right now, that seems to be much less likely and it would be best to avoid invasive testing of that nature in this elderly patient, unless there is a clear clinical need.   LOS: 0 days   Youlanda Mighty Jarelyn Bambach  01/27/2019, 3:46 PM   Pager [phone] If no answer or after 5 PM call [phone]

## 2019-01-27 NOTE — ED Notes (Signed)
Patient transported to CT 

## 2019-01-27 NOTE — H&P (Signed)
[facility]                                                                                    Patient Demographics  Charlotte Castro, is a 85 y.o. female  CSN: [id number]  MRN: [id number]  DOB - 09-04-1932  Admit Date - 01/27/2019  Outpatient Primary MD for the patient is Isaac Bliss, Rayford Halsted, MD   With History of -  Past Medical History:  Diagnosis Date  . Anemia    history of  . Anxiety   . Arthritis    "left knee" (04/05/2017)  . Atrial fibrillation ([place])   . Atrial flutter ([place])    typical appearing  . Atrial tachycardia ([place])    ablated 11/17/10  by JA  from the [facility] of the aorta  . Chest pain on exertion   . CHF (congestive heart failure) ([place])   . Chronic lower back pain   . Chronic nausea   . Dizziness    chronic and of an unclear etiology  . Dyspnea   . Endometrial cancer (HCC)    grade 1  . Gallstone pancreatitis   . Gastritis   . GERD (gastroesophageal reflux disease)   . History of blood transfusion ~ 1948  . History of hiatal hernia   . HTN (hypertension)   . Hyperlipemia   . Hypothyroidism   . Left leg numbness   . Obesity   . Osteoporosis   . PMB (postmenopausal bleeding)   . Sinus headache   . Toe ulcer ([place])    left 3rd toe  . Vitamin D deficiency       Past Surgical History:  Procedure Laterality Date  . ATRIAL ABLATION SURGERY  11/17/10   Atrial tachycardia arising from [facility] of the aorta ablated by JA  . BACK SURGERY    . CARDIAC CATHETERIZATION  01/11/2011   Archie Endo 01/12/2011  . CHOLECYSTECTOMY N/A 04/08/2017   Procedure: LAPAROSCOPIC CHOLECYSTECTOMY;  Surgeon: Georganna Skeans, MD;  Location: [place];  Service: General;  Laterality: N/A;  . FRACTURE SURGERY    . HYSTEROSCOPY W/D&C N/A 08/05/2017   Procedure: DILATATION AND CURETTAGE /HYSTEROSCOPY AND POLYPECTOMY;  Surgeon: Osborne Oman, MD;  Location: [facility];  Service: Gynecology;  Laterality: N/A;  . LUMBAR SPINE SURGERY  ~ 1998  . ROBOTIC ASSISTED TOTAL  HYSTERECTOMY WITH BILATERAL SALPINGO OOPHERECTOMY Bilateral 09/20/2017   Procedure: XI ROBOTIC ASSISTED TOTAL HYSTERECTOMY WITH BILATERAL SALPINGO OOPHORECTOMY, SENTINAL LYMPH NODE BIOPSY;  Surgeon: Everitt Amber, MD;  Location: [facility];  Service: Gynecology;  Laterality: Bilateral;  . SHOULDER SURGERY Right 1984 X2   Rollene Rotunda 01/19/2011  . WRIST FRACTURE SURGERY Left 1983    in for   Chief Complaint  Patient presents with  . Abdominal Pain  . Emesis     HPI  Charlotte Castro  is a 85 y.o. female, resident of an assisted living facility with past medical history significant for A. fib, on Coumadin, congestive heart failure, hypertension and status post cholecystectomy in 2018 for recurrent gallstone pancreatitis , presenting with 3 days history of nausea vomiting with abdominal pain.  Her abdominal pain is epigastric/umbilical region and worse with meals.  Her bowel movements are normal.  No chest pains shortness of breath, urinary symptoms, fever or chills.   ER work-up showed slightly elevated bilirubin, normal CBC CT of the abdomen which showed no evidence of bowel obstruction but enlarged common bile duct. Discussed with Dr. Cristina Gong from GI who advised admission for observation and advancing diet slowly.  Further work-up will be needed if patient cannot tolerate advancing diet. Her nurse told me that she has episodes of anxiety and she would like antianxiety medications so we will start the patient on Klonopin as needed.    Review of Systems    In addition to the HPI above,  No Fever-chills, No Headache, No changes with Vision or hearing, No problems swallowing food or Liquids, No Chest pain, Cough or Shortness of Breath, No Blood in stool or Urine, No dysuria, No new skin rashes or bruises, No new joints pains-aches,  No new weakness, tingling, numbness in any extremity, No recent weight gain or loss, No polyuria, polydypsia or polyphagia, No significant Mental Stressors.  A full 10  point Review of Systems was done, except as stated above, all other Review of Systems were negative.   Social History Social History   Tobacco Use  . Smoking status: Never Smoker  . Smokeless tobacco: Never Used  Substance Use Topics  . Alcohol use: No     Family History Family History  Problem Relation Age of Onset  . Heart attack Father   . Hypertension Father      Prior to Admission medications   Medication Sig Start Date End Date Taking? Authorizing Provider  acetaminophen (TYLENOL) 650 MG CR tablet Take 650 mg by mouth every morning.    [provider]  cyanocobalamin (,VITAMIN B-12,) 1000 MCG/ML injection Inject 1 ml once a week for 1 month. Then 1 ml once a month thereafter. 08/15/18   Isaac Bliss, Rayford Halsted, MD  Cyanocobalamin (VITAMIN B-12 PO) Take 0.5 tablets by mouth 2 (two) times daily with a meal.    [provider]  levothyroxine (SYNTHROID, LEVOTHROID) 88 MCG tablet Take 1 tablet (88 mcg total) by mouth daily before breakfast. 08/08/18   Isaac Bliss, Rayford Halsted, MD  metoprolol tartrate (LOPRESSOR) 50 MG tablet TAKE ONE TABLET TWICE DAILY WITH A MEAL Patient taking differently: Take 50 mg by mouth 2 (two) times daily with a meal.  10/04/18   Jettie Booze, MD  QUEtiapine (SEROQUEL) 25 MG tablet Take 1 tablet (25 mg total) by mouth 2 (two) times daily as needed (hallucinations). 01/18/19   Swayze, Ava, DO  SYRINGE-NEEDLE, DISP, 3 ML 25G X 1-1/2" 3 ML MISC Use as needed for B12 injections 08/15/18   Isaac Bliss, Rayford Halsted, MD  warfarin (COUMADIN) 5 MG tablet TAKE 1/2 TO ONE TABLET DAILY AS DIRECTEDBY COUMADIN CLINIC Patient taking differently: Take 2.5-5 mg by mouth See admin instructions. Take 2.5 mg by mouth in the evening on Sunday and 5 mg on Mon/Tues/Wed/Thurs/Fri/Sat 10/04/18   Jettie Booze, MD    Allergies  Allergen Reactions  . Iodinated Diagnostic Agents Shortness Of Breath    Causes headaches, also  . Zosyn [Piperacillin  Sod-Tazobactam So] Other (See Comments)    On MAR  . Penicillins Other (See Comments)    Tolerated Zosyn Oct 2018- Patient is unaware this is an "allergy" Did it involve swelling of the face/tongue/throat, SOB, or low BP? Unk Did it involve sudden or severe rash/hives, skin peeling, or any reaction on the inside of your mouth  or nose? Unk Did you need to seek medical attention at a hospital or doctor's office? Unk When did it last happen? Unk If all above answers are "NO", may proceed with cephalosporin use.     Physical Exam  Vitals  Blood pressure 127/64, pulse 85, temperature (!) 97.3 F (36.3 C), resp. rate (!) 24, SpO2 100 %. General appearance, very pleasant, looks tired HEENT no jaundice or pallor, no facial deviation noted Neck supple, no neck vein distention Chest decreased breath sounds bilaterally Heart irregularly irregular no murmurs gallops rubs Abdomen soft, nontender, bowel sounds are present Extremities no clubbing cyanosis or edema Neuro grossly nonfocal patient moving all extremities Skin no rashes or ulcers Psych no suicidal homicidal ideation.  Looks well kempt    Data Review  CBC Recent Labs  Lab 01/27/19 0854  WBC 8.9  HGB 14.9  HCT 42.2  PLT 227  MCV 93.6  MCH 33.0  MCHC 35.3  RDW 13.8   ------------------------------------------------------------------------------------------------------------------  Chemistries  Recent Labs  Lab 01/27/19 0854  NA 141  K 4.4  CL 104  CO2 25  GLUCOSE 112*  BUN 20  CREATININE 1.15*  CALCIUM 10.0  AST 23  ALT 15  ALKPHOS 61  BILITOT 1.4*   ------------------------------------------------------------------------------------------------------------------ estimated creatinine clearance is 43.4 mL/min (A) (by C-G formula based on SCr of 1.15 mg/dL (H)). ------------------------------------------------------------------------------------------------------------------ No results for input(s): TSH,  T4TOTAL, T3FREE, THYROIDAB in the last 72 hours.  Invalid input(s): FREET3   Coagulation profile Recent Labs  Lab 01/27/19 0941  INR 2.3*   ------------------------------------------------------------------------------------------------------------------- No results for input(s): DDIMER in the last 72 hours. -------------------------------------------------------------------------------------------------------------------  Cardiac Enzymes No results for input(s): CKMB, TROPONINI, MYOGLOBIN in the last 168 hours.  Invalid input(s): CK ------------------------------------------------------------------------------------------------------------------ Invalid input(s): POCBNP   ---------------------------------------------------------------------------------------------------------------  Urinalysis    Component Value Date/Time   COLORURINE AMBER (A) 01/27/2019 0930   APPEARANCEUR TURBID (A) 01/27/2019 0930   LABSPEC 1.021 01/27/2019 0930   PHURINE 6.0 01/27/2019 0930   GLUCOSEU NEGATIVE 01/27/2019 0930   HGBUR SMALL (A) 01/27/2019 0930   BILIRUBINUR NEGATIVE 01/27/2019 0930   KETONESUR NEGATIVE 01/27/2019 0930   PROTEINUR 100 (A) 01/27/2019 0930   UROBILINOGEN 2.0 (H) 11/18/2010 0019   NITRITE NEGATIVE 01/27/2019 0930   LEUKOCYTESUR MODERATE (A) 01/27/2019 0930    ----------------------------------------------------------------------------------------------------------------   Imaging results:   Ct Abdomen Pelvis Wo Contrast  Result Date: 01/27/2019 CLINICAL DATA:  Evaluate for bowel obstruction. Pain after eating. Emesis yesterday. EXAM: CT ABDOMEN AND PELVIS WITHOUT CONTRAST TECHNIQUE: Multidetector CT imaging of the abdomen and pelvis was performed following the standard protocol without IV contrast. COMPARISON:  04/05/2017 FINDINGS: Lower chest: No acute abnormality. Moderate size hiatal hernia identified with greater than 50% intrathoracic stomach. Hepatobiliary: No  liver abnormality. Previous cholecystectomy. Mild fusiform dilatation of the CBD measures up to 1.2 cm. Pancreas: Unremarkable. No pancreatic ductal dilatation or surrounding inflammatory changes. Spleen: Normal in size without focal abnormality. Adrenals/Urinary Tract: Normal appearance of the adrenal glands. No kidney mass, kidney stone or hydronephrosis identified. Urinary bladder appears normal. Stomach/Bowel: No evidence of bowel wall thickening, distension or inflammation. The appendix is visualized and appears normal. No pathologic dilatation of the colon. Vascular/Lymphatic: Aortic atherosclerosis. No aneurysm. No abdominal or pelvic adenopathy. No inguinal adenopathy. Reproductive: Status post hysterectomy. No adnexal masses. Other: No abdominal wall hernia or abnormality. No abdominopelvic ascites. Musculoskeletal: No acute or significant osseous findings. Chronic postoperative changes from interbody fusion L4-5 and L5-S1 with posterior decompression noted. IMPRESSION: 1. No evidence for  bowel obstruction. 2. Large hiatal hernia 3.  Aortic Atherosclerosis (ICD10-I70.0). 4. Status post cholecystectomy with increase caliber of the CBD. Electronically Signed   By: Kerby Moors M.D.   On: 01/27/2019 10:46   Dg Chest 2 View  Result Date: 01/19/2019 CLINICAL DATA:  85 year old female with chest pain. EXAM: CHEST - 2 VIEW COMPARISON:  Chest radiograph dated 01/13/2019 FINDINGS: There is no focal consolidation, pleural effusion, or pneumothorax. Large hiatal hernia in noted. Stable cardiac silhouette. No acute osseous pathology. IMPRESSION: 1. No acute cardiopulmonary process. 2. Large hiatal hernia. Electronically Signed   By: Anner Crete M.D.   On: 01/19/2019 23:31   Dg Chest 2 View  Result Date: 01/13/2019 CLINICAL DATA:  Shortness of breath and chest pain EXAM: CHEST - 2 VIEW COMPARISON:  June 10, 2017 chest radiograph and chest CT Oct 26, 2017 FINDINGS: There is no edema or consolidation.  Heart size and pulmonary vascularity are normal. No adenopathy. There is a large paraesophageal hernia. No bone lesions. No pneumothorax. IMPRESSION: Large paraesophageal hernia. No edema or consolidation. Stable cardiac silhouette. Electronically Signed   By: Lowella Grip III M.D.   On: 01/13/2019 16:42   Dg Abd 1 View  Result Date: 01/14/2019 CLINICAL DATA:  Nausea and vomiting EXAM: ABDOMEN - 1 VIEW COMPARISON:  None FINDINGS: No dilated loops of large or small bowel. No pathologic calcifications. No organomegaly. No aggressive osseous lesion. No intraperitoneal free air. Lower lumbar fusion. Degenerative narrowing of the hip joints. IMPRESSION: No bowel obstruction. Electronically Signed   By: Suzy Bouchard M.D.   On: 01/14/2019 14:37      Assessment & Plan  Intractable nausea and vomiting with enlarged common bile duct Discussed with GI, Dr. Cristina Gong We will advance diet slowly and monitor  A. fib on Coumadin and metoprolol will continue  Anxiety, reported by nurse who was taking care of her and has very good experience with her We will start Klonopin as needed  UTI Start Rocephin  DVT Prophylaxis Coumadin  AM Labs Ordered, also please review Full Orders  Family Communication: Called her son Marcello Moores, awaiting response.  Code Status full  Disposition Plan: Home  Time spent in minutes : 35 minutes  Condition GUARDED   @SIGNATURE @

## 2019-01-27 NOTE — ED Provider Notes (Signed)
[facility] EMERGENCY DEPARTMENT Provider Note   CSN: [id number] Arrival date & time: 01/27/19  6712    History   Chief Complaint Chief Complaint  Patient presents with  . Abdominal Pain  . Emesis    HPI Charlotte Castro is a 85 y.o. female with past medical history of atrial fibrillation on Coumadin, CHF, hypertension, hypothyroidism, cholecystectomy, presenting to the emergency department from [facility] facility with complaint of abdominal pain, nausea, and vomiting for 3 days.  She had and x-ray of her abdomen done which showed dilated bowel loops and findings consistent with an ileus, however cannot rule out bowel obstruction.  She states her symptoms have been worsening.  She has pain in her epigastric abdomen radiating to her umbilical region.  She has had nausea and vomiting, that is worse at bedtime and after meals.  She states she had a large bowel movement yesterday.  She denies fever, urinary symptoms, or other associated symptoms.     The history is provided by the patient and the EMS personnel.    Past Medical History:  Diagnosis Date  . Anemia    history of  . Anxiety   . Arthritis    "left knee" (04/05/2017)  . Atrial fibrillation ([place])   . Atrial flutter ([place])    typical appearing  . Atrial tachycardia ([place])    ablated 11/17/10  by JA  from the [facility] of the aorta  . Chest pain on exertion   . CHF (congestive heart failure) ([place])   . Chronic lower back pain   . Chronic nausea   . Dizziness    chronic and of an unclear etiology  . Dyspnea   . Endometrial cancer (HCC)    grade 1  . Gallstone pancreatitis   . Gastritis   . GERD (gastroesophageal reflux disease)   . History of blood transfusion ~ 1948  . History of hiatal hernia   . HTN (hypertension)   . Hyperlipemia   . Hypothyroidism   . Left leg numbness   . Obesity   . Osteoporosis   . PMB (postmenopausal bleeding)   . Sinus headache   . Toe ulcer ([place])    left 3rd  toe  . Vitamin D deficiency     Patient Active Problem List   Diagnosis Date Noted  . Intractable nausea and vomiting 01/27/2019  . AKI (acute kidney injury) ([place]) 01/14/2019  . B12 deficiency 08/08/2018  . Hypothyroidism 08/08/2018  . Endometrial cancer ([place]) 08/05/2017  . Toe ulcer, left, with unspecified severity ([place]) 08/04/2017  . 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 ([place]) 08/04/2017  . Postmenopausal bleeding 08/01/2017  . Delirium 04/14/2016  . Tremor 04/14/2016  . Chronic diastolic CHF (congestive heart failure) ([place]) 04/10/2016  . Essential hypertension, benign 01/18/2014  . Long term current use of anticoagulant therapy 01/18/2014  . Permanent atrial fibrillation 06/10/2011    Past Surgical History:  Procedure Laterality Date  . ATRIAL ABLATION SURGERY  11/17/10   Atrial tachycardia arising from [facility] of the aorta ablated by JA  . BACK SURGERY    . CARDIAC CATHETERIZATION  01/11/2011   Archie Endo 01/12/2011  . CHOLECYSTECTOMY N/A 04/08/2017   Procedure: LAPAROSCOPIC CHOLECYSTECTOMY;  Surgeon: Georganna Skeans, MD;  Location: [facility];  Service: General;  Laterality: N/A;  . FRACTURE SURGERY    . HYSTEROSCOPY W/D&C N/A 08/05/2017   Procedure: DILATATION AND CURETTAGE /HYSTEROSCOPY AND POLYPECTOMY;  Surgeon: Osborne Oman, MD;  Location: [facility];  Service: Gynecology;  Laterality: N/A;  . LUMBAR SPINE SURGERY  ~ 1998  . ROBOTIC ASSISTED TOTAL HYSTERECTOMY WITH BILATERAL SALPINGO OOPHERECTOMY Bilateral 09/20/2017   Procedure: XI ROBOTIC ASSISTED TOTAL HYSTERECTOMY WITH BILATERAL SALPINGO OOPHORECTOMY, SENTINAL LYMPH NODE BIOPSY;  Surgeon: Everitt Amber, MD;  Location: [facility];  Service: Gynecology;  Laterality: Bilateral;  . SHOULDER SURGERY Right 1984 X2   Rollene Rotunda 01/19/2011  . WRIST FRACTURE SURGERY Left 1983     OB History    Gravida  3   Para      Term      Preterm      AB  1   Living        SAB  1   TAB      Ectopic      Multiple       Live Births               Home Medications    Prior to Admission medications   Medication Sig Start Date End Date Taking? Authorizing Provider  acetaminophen (TYLENOL) 650 MG CR tablet Take 650 mg by mouth every morning.    [provider]  cyanocobalamin (,VITAMIN B-12,) 1000 MCG/ML injection Inject 1 ml once a week for 1 month. Then 1 ml once a month thereafter. 08/15/18   Isaac Bliss, Rayford Halsted, MD  Cyanocobalamin (VITAMIN B-12 PO) Take 0.5 tablets by mouth 2 (two) times daily with a meal.    [provider]  levothyroxine (SYNTHROID, LEVOTHROID) 88 MCG tablet Take 1 tablet (88 mcg total) by mouth daily before breakfast. 08/08/18   Isaac Bliss, Rayford Halsted, MD  metoprolol tartrate (LOPRESSOR) 50 MG tablet TAKE ONE TABLET TWICE DAILY WITH A MEAL Patient taking differently: Take 50 mg by mouth 2 (two) times daily with a meal.  10/04/18   Jettie Booze, MD  QUEtiapine (SEROQUEL) 25 MG tablet Take 1 tablet (25 mg total) by mouth 2 (two) times daily as needed (hallucinations). 01/18/19   Swayze, Ava, DO  SYRINGE-NEEDLE, DISP, 3 ML 25G X 1-1/2" 3 ML MISC Use as needed for B12 injections 08/15/18   Isaac Bliss, Rayford Halsted, MD  warfarin (COUMADIN) 5 MG tablet TAKE 1/2 TO ONE TABLET DAILY AS DIRECTEDBY COUMADIN CLINIC Patient taking differently: Take 2.5-5 mg by mouth See admin instructions. Take 2.5 mg by mouth in the evening on Sunday and 5 mg on Mon/Tues/Wed/Thurs/Fri/Sat 10/04/18   Jettie Booze, MD    Family History Family History  Problem Relation Age of Onset  . Heart attack Father   . Hypertension Father     Social History Social History   Tobacco Use  . Smoking status: Never Smoker  . Smokeless tobacco: Never Used  Substance Use Topics  . Alcohol use: No  . Drug use: No     Allergies   Iodinated diagnostic agents, Zosyn [piperacillin sod-tazobactam so], and Penicillins   Review of Systems Review of Systems  Gastrointestinal:  Positive for abdominal pain, nausea and vomiting.  Hematological: Bruises/bleeds easily.  All other systems reviewed and are negative.    Physical Exam Updated Vital Signs BP 120/65   Pulse (!) 104   Temp (!) 97.3 F (36.3 C)   Resp 13   SpO2 98%   Physical Exam Vitals signs and nursing note reviewed.  Constitutional:      General: She is not in acute distress.    Appearance: She is well-developed.  HENT:     Head: Normocephalic and  atraumatic.  Eyes:     Conjunctiva/sclera: Conjunctivae normal.  Cardiovascular:     Rate and Rhythm: Normal rate. Rhythm irregular.  Pulmonary:     Effort: Pulmonary effort is normal. No respiratory distress.     Breath sounds: Normal breath sounds.  Abdominal:     General: Bowel sounds are normal.     Palpations: Abdomen is soft.     Tenderness: There is no abdominal tenderness. There is no guarding or rebound.  Skin:    General: Skin is warm.  Neurological:     Mental Status: She is alert.  Psychiatric:        Behavior: Behavior normal.      ED Treatments / Results  Labs (all labs ordered are listed, but only abnormal results are displayed) Labs Reviewed  COMPREHENSIVE METABOLIC PANEL - Abnormal; Notable for the following components:      Result Value   Glucose, Bld 112 (*)    Creatinine, Ser 1.15 (*)    Total Bilirubin 1.4 (*)    GFR calc non Af Amer 44 (*)    GFR calc Af Amer 51 (*)    All other components within normal limits  URINALYSIS, ROUTINE W REFLEX MICROSCOPIC - Abnormal; Notable for the following components:   Color, Urine AMBER (*)    APPearance TURBID (*)    Hgb urine dipstick SMALL (*)    Protein, ur 100 (*)    Leukocytes,Ua MODERATE (*)    WBC, UA >50 (*)    Bacteria, UA MANY (*)    All other components within normal limits  PROTIME-INR - Abnormal; Notable for the following components:   Prothrombin Time 25.2 (*)    INR 2.3 (*)    All other components within normal limits  URINE CULTURE  SARS  CORONAVIRUS 2 (HOSPITAL ORDER, [facility])  LIPASE, BLOOD  CBC    EKG None  Radiology Ct Abdomen Pelvis Wo Contrast  Result Date: 01/27/2019 CLINICAL DATA:  Evaluate for bowel obstruction. Pain after eating. Emesis yesterday. EXAM: CT ABDOMEN AND PELVIS WITHOUT CONTRAST TECHNIQUE: Multidetector CT imaging of the abdomen and pelvis was performed following the standard protocol without IV contrast. COMPARISON:  04/05/2017 FINDINGS: Lower chest: No acute abnormality. Moderate size hiatal hernia identified with greater than 50% intrathoracic stomach. Hepatobiliary: No liver abnormality. Previous cholecystectomy. Mild fusiform dilatation of the CBD measures up to 1.2 cm. Pancreas: Unremarkable. No pancreatic ductal dilatation or surrounding inflammatory changes. Spleen: Normal in size without focal abnormality. Adrenals/Urinary Tract: Normal appearance of the adrenal glands. No kidney mass, kidney stone or hydronephrosis identified. Urinary bladder appears normal. Stomach/Bowel: No evidence of bowel wall thickening, distension or inflammation. The appendix is visualized and appears normal. No pathologic dilatation of the colon. Vascular/Lymphatic: Aortic atherosclerosis. No aneurysm. No abdominal or pelvic adenopathy. No inguinal adenopathy. Reproductive: Status post hysterectomy. No adnexal masses. Other: No abdominal wall hernia or abnormality. No abdominopelvic ascites. Musculoskeletal: No acute or significant osseous findings. Chronic postoperative changes from interbody fusion L4-5 and L5-S1 with posterior decompression noted. IMPRESSION: 1. No evidence for bowel obstruction. 2. Large hiatal hernia 3.  Aortic Atherosclerosis (ICD10-I70.0). 4. Status post cholecystectomy with increase caliber of the CBD. Electronically Signed   By: Kerby Moors M.D.   On: 01/27/2019 10:46    Procedures Procedures (including critical care time)  Medications Ordered in ED Medications   sodium chloride flush (NS) 0.9 % injection 3 mL (has no administration in time range)  fentaNYL (SUBLIMAZE) injection 50  mcg (50 mcg Intravenous Given 01/27/19 1000)  ondansetron (ZOFRAN) injection 4 mg (4 mg Intravenous Given 01/27/19 0958)  sulfamethoxazole-trimethoprim (BACTRIM DS) 800-160 MG per tablet 1 tablet (1 tablet Oral Given 01/27/19 1223)     Initial Impression / Assessment and Plan / ED Course  I have reviewed the triage vital signs and the nursing notes.  Pertinent labs & imaging results that were available during my care of the patient were reviewed by me and considered in my medical decision making (see chart for details).   Patient with past medical history of cholecystectomy, hiatal hernia, presenting to the ED with complaint of 3 days of nausea, vomiting, and epigastric abdominal pain.  Vomiting is worse after meals.  She had an x-ray done at her living facility was concerning for possible ileus.  On exam today, her vital signs are stable, afebrile, no distress is noted.  She has some epigastric abdominal tenderness, no guarding or rebound.  She states her nausea is improved after she had a few episodes of emesis following breakfast this morning.  Labs and imaging obtained given concern for possible ileus versus obstruction per x-ray imaging.  UA is consistent with infection, will order a dose of Bactrim given patient's medication allergies. CT without evidence of bowel obstruction though does show a dilated common bile duct to 1.2 cm.  She also has a large hiatal hernia, again noted in the CT.  Her LFTs are normal.  No leukocytosis. Consult placed to GI for recommendations.  Clinical Course as of Jan 26 1437  Sat Jan 27, 2019  1313 Patient discussed with Dr. Cristina Gong with [facility].  States CBD is more dilated than expected this duration post-cholecystectomy. Also discussed possibility of hiatal hernia He recommends no further imaging at this time, recommends admit for observation  for PO tolerance. Endoscopic u/s if failure to improve.  Appreciate consult.   [JR]    Clinical Course User Index [JR] Robinson, [place] N, PA-C    Dr. Laren Everts accepting admission.  The patient appears reasonably stabilized for admission considering the current resources, flow, and capabilities available in the ED at this time, and I doubt any other [facility] requiring further screening and/or treatment in the ED prior to admission.     Final Clinical Impressions(s) / ED Diagnoses   Final diagnoses:  Acute cystitis without hematuria  Non-intractable vomiting with nausea, unspecified vomiting type  Epigastric abdominal pain  Hiatal hernia    ED Discharge Orders    None       Robinson, [place] N, PA-C 01/27/19 1438    Lennice Sites, DO 01/27/19 2157

## 2019-01-27 NOTE — ED Notes (Addendum)
ED TO INPATIENT HANDOFF REPORT  ED Nurse Name and Phone #: [phone]  S Name/Age/Gender Charlotte Castro 85 y.o. female Room/Bed: H020C/H020C  Code Status   Code Status: Prior  Home/SNF/Other Nursing Home Patient oriented to: self and place Is this baseline? Yes   Triage Complete: Triage complete  Chief Complaint bowel obstruction  Triage Note Pt here from [place]. A&OX4. Pt had CT and noted have a bowel obstruction. VSS. Pt states she has no abdominal pain presently but has pain only after eating. Pt reports emesis yesterday that was "thick and brown"   Allergies Allergies  Allergen Reactions  . Iodinated Diagnostic Agents Shortness Of Breath and Other (See Comments)    "Allergic," per [facility]- Causes headaches, also  . Zosyn [Piperacillin Sod-Tazobactam So] Other (See Comments)    "Allergic," per [facility]  . Penicillins Other (See Comments)    Tolerated Zosyn Oct 2018, but "Allergic," per [facility] Did it involve swelling of the face/tongue/throat, SOB, or low BP? Unk Did it involve sudden or severe rash/hives, skin peeling, or any reaction on the inside of your mouth or nose? Unk Did you need to seek medical attention at a hospital or doctor's office? Unk When did it last happen? Unk If all above answers are "NO", may proceed with cephalosporin use.     Level of Care/Admitting Diagnosis ED Disposition    ED Disposition Condition [facility] Area: [place] [100100]  Level of Care: Med-Surg [16]  I expect the patient will be discharged within 24 hours: Yes  LOW acuity---Tx typically complete <24 hrs---ACUTE conditions typically can be evaluated <24 hours---LABS likely to return to acceptable levels <24 hours---IS near functional baseline---EXPECTED to return to current living arrangement---NOT newly hypoxic: Meets criteria for 5C-Observation unit  Covid Evaluation: Asymptomatic Screening Protocol (No Symptoms)  Diagnosis: Intractable nausea and  vomiting [454098]  Admitting Physician: Laren Everts, [place] Marshal.Browner  Attending Physician: Laren Everts, ALI Marshal.Browner  PT Class (Do Not Modify): Observation [104]  PT Acc Code (Do Not Modify): Observation [10022]       B Medical/Surgery History Past Medical History:  Diagnosis Date  . Anemia    history of  . Anxiety   . Arthritis    "left knee" (04/05/2017)  . Atrial fibrillation ([place])   . Atrial flutter ([place])    typical appearing  . Atrial tachycardia ([place])    ablated 11/17/10  by JA  from the [facility] of the aorta  . Chest pain on exertion   . CHF (congestive heart failure) ([place])   . Chronic lower back pain   . Chronic nausea   . Dizziness    chronic and of an unclear etiology  . Dyspnea   . Endometrial cancer (HCC)    grade 1  . Gallstone pancreatitis   . Gastritis   . GERD (gastroesophageal reflux disease)   . History of blood transfusion ~ 1948  . History of hiatal hernia   . HTN (hypertension)   . Hyperlipemia   . Hypothyroidism   . Left leg numbness   . Obesity   . Osteoporosis   . PMB (postmenopausal bleeding)   . Sinus headache   . Toe ulcer ([place])    left 3rd toe  . Vitamin D deficiency    Past Surgical History:  Procedure Laterality Date  . ATRIAL ABLATION SURGERY  11/17/10   Atrial tachycardia arising from [facility] of the aorta ablated by JA  . BACK SURGERY    . CARDIAC  CATHETERIZATION  01/11/2011   Archie Endo 01/12/2011  . CHOLECYSTECTOMY N/A 04/08/2017   Procedure: LAPAROSCOPIC CHOLECYSTECTOMY;  Surgeon: Georganna Skeans, MD;  Location: [place];  Service: General;  Laterality: N/A;  . FRACTURE SURGERY    . HYSTEROSCOPY W/D&C N/A 08/05/2017   Procedure: DILATATION AND CURETTAGE /HYSTEROSCOPY AND POLYPECTOMY;  Surgeon: Osborne Oman, MD;  Location: [facility];  Service: Gynecology;  Laterality: N/A;  . LUMBAR SPINE SURGERY  ~ 1998  . ROBOTIC ASSISTED TOTAL HYSTERECTOMY WITH BILATERAL SALPINGO OOPHERECTOMY Bilateral 09/20/2017   Procedure: XI ROBOTIC ASSISTED TOTAL HYSTERECTOMY WITH  BILATERAL SALPINGO OOPHORECTOMY, SENTINAL LYMPH NODE BIOPSY;  Surgeon: Everitt Amber, MD;  Location: [facility];  Service: Gynecology;  Laterality: Bilateral;  . SHOULDER SURGERY Right 1984 X2   Rollene Rotunda 01/19/2011  . WRIST FRACTURE SURGERY Left 1983     A IV Location/Drains/Wounds Patient Lines/Drains/Airways Status   Active Line/Drains/Airways    Name:   Placement date:   Placement time:   Site:   Days:   Peripheral IV 01/19/19 Left Antecubital   01/19/19    -    Antecubital   8   Peripheral IV 01/27/19 Left Hand   01/27/19    0957    Hand   less than 1   Incision (Closed) 04/08/17 Abdomen Other (Comment)   04/08/17    1351     659   Incision (Closed) 08/05/17 Perineum Other (Comment)   08/05/17    1148     540   Incision - 4 Ports Abdomen 1: Umbilicus 2: Mid;Upper 3: Right;Upper 4: Right;Lower   04/08/17    1423     659   Incision - 5 Ports Abdomen 1: Right;Lateral 2: Umbilicus 3: Left;Lateral;Upper 4: Left;Lateral;Mid 5: Left;Lateral;Lower   09/20/17    1125     494          Intake/Output Last 24 hours No intake or output data in the 24 hours ending 01/27/19 1936  Labs/Imaging Results for orders placed or performed during the hospital encounter of 01/27/19 (from the past 48 hour(s))  Lipase, blood     Status: None   Collection Time: 01/27/19  8:54 AM  Result Value Ref Range   Lipase 37 11 - 51 U/L    Comment: Performed at [facility], [place] [address] [place], [place] [address]  Comprehensive metabolic panel     Status: Abnormal   Collection Time: 01/27/19  8:54 AM  Result Value Ref Range   Sodium 141 135 - 145 mmol/L   Potassium 4.4 3.5 - 5.1 mmol/L   Chloride 104 98 - 111 mmol/L   CO2 25 22 - 32 mmol/L   Glucose, Bld 112 (H) 70 - 99 mg/dL   BUN 20 8 - 23 mg/dL   Creatinine, Ser 1.15 (H) 0.44 - 1.00 mg/dL   Calcium 10.0 8.9 - 10.3 mg/dL   Total Protein 6.7 6.5 - 8.1 g/dL   Albumin 3.6 3.5 - 5.0 g/dL   AST 23 15 - 41 U/L   ALT 15 0 - 44 U/L   Alkaline Phosphatase 61 38  - 126 U/L   Total Bilirubin 1.4 (H) 0.3 - 1.2 mg/dL   GFR calc non Af Amer 44 (L) >60 mL/min   GFR calc Af Amer 51 (L) >60 mL/min   Anion gap 12 5 - 15    Comment: Performed at [facility] [address] [place],  [address]  CBC     Status: None  Collection Time: 01/27/19  8:54 AM  Result Value Ref Range   WBC 8.9 4.0 - 10.5 K/uL   RBC 4.51 3.87 - 5.11 MIL/uL   Hemoglobin 14.9 12.0 - 15.0 g/dL   HCT 42.2 36.0 - 46.0 %   MCV 93.6 80.0 - 100.0 fL   MCH 33.0 26.0 - 34.0 pg   MCHC 35.3 30.0 - 36.0 g/dL   RDW 13.8 11.5 - 15.5 %   Platelets 227 150 - 400 K/uL   nRBC 0.0 0.0 - 0.2 %    Comment: Performed at [facility], [place] [address] [place], [place] [address]  Urinalysis, Routine w reflex microscopic     Status: Abnormal   Collection Time: 01/27/19  9:30 AM  Result Value Ref Range   Color, Urine AMBER (A) YELLOW    Comment: BIOCHEMICALS MAY BE AFFECTED BY COLOR   APPearance TURBID (A) CLEAR   Specific Gravity, Urine 1.021 1.005 - 1.030   pH 6.0 5.0 - 8.0   Glucose, UA NEGATIVE NEGATIVE mg/dL   Hgb urine dipstick SMALL (A) NEGATIVE   Bilirubin Urine NEGATIVE NEGATIVE   Ketones, ur NEGATIVE NEGATIVE mg/dL   Protein, ur 100 (A) NEGATIVE mg/dL   Nitrite NEGATIVE NEGATIVE   Leukocytes,Ua MODERATE (A) NEGATIVE   RBC / HPF 11-20 0 - 5 RBC/hpf   WBC, UA >50 (H) 0 - 5 WBC/hpf   Bacteria, UA MANY (A) NONE SEEN   Squamous Epithelial / LPF 6-10 0 - 5   WBC Clumps PRESENT    Mucus PRESENT     Comment: Performed at [facility], [address], [place], [place] [address]  Protime-INR     Status: Abnormal   Collection Time: 01/27/19  9:41 AM  Result Value Ref Range   Prothrombin Time 25.2 (H) 11.4 - 15.2 seconds   INR 2.3 (H) 0.8 - 1.2    Comment: (NOTE) INR goal varies based on device and disease states. Performed at [facility], [place] [address] [place], [place] [address]   SARS Coronavirus 2 [facility] order, Performed in [facility] hospital  lab) Nasopharyngeal Nasopharyngeal Swab     Status: None   Collection Time: 01/27/19  1:26 PM   Specimen: Nasopharyngeal Swab  Result Value Ref Range   SARS Coronavirus 2 NEGATIVE NEGATIVE    Comment: (NOTE) If result is NEGATIVE SARS-CoV-2 target nucleic acids are NOT DETECTED. The SARS-CoV-2 RNA is generally detectable in upper and lower  respiratory specimens during the acute phase of infection. The lowest  concentration of SARS-CoV-2 viral copies this assay can detect is 250  copies / mL. A negative result does not preclude SARS-CoV-2 infection  and should not be used as the sole basis for treatment or other  patient management decisions.  A negative result may occur with  improper specimen collection / handling, submission of specimen other  than nasopharyngeal swab, presence of viral mutation(s) within the  areas targeted by this assay, and inadequate number of viral copies  (<250 copies / mL). A negative result must be combined with clinical  observations, patient history, and epidemiological information. If result is POSITIVE SARS-CoV-2 target nucleic acids are DETECTED. The SARS-CoV-2 RNA is generally detectable in upper and lower  respiratory specimens dur ing the acute phase of infection.  Positive  results are indicative of active infection with SARS-CoV-2.  Clinical  correlation with patient history and other diagnostic information is  necessary to determine patient infection status.  Positive results  do  not rule out bacterial infection or co-infection with other viruses. If result is PRESUMPTIVE POSTIVE SARS-CoV-2 nucleic acids MAY BE PRESENT.   A presumptive positive result was obtained on the submitted specimen  and confirmed on repeat testing.  While 2019 novel coronavirus  (SARS-CoV-2) nucleic acids may be present in the submitted sample  additional confirmatory testing may be necessary for epidemiological  and / or clinical management purposes  to differentiate  between  SARS-CoV-2 and other Sarbecovirus currently known to infect humans.  If clinically indicated additional testing with an alternate test  methodology [phone]) is advised. The SARS-CoV-2 RNA is generally  detectable in upper and lower respiratory sp ecimens during the acute  phase of infection. The expected result is Negative. Fact Sheet for Patients:  StrictlyIdeas.no Fact Sheet for Healthcare Providers: BankingDealers.co.za This test is not yet approved or cleared by the [place] FDA and has been authorized for detection and/or diagnosis of SARS-CoV-2 by FDA under an Emergency Use Authorization (EUA).  This EUA will remain in effect (meaning this test can be used) for the duration of the COVID-19 declaration under Section 564(b)(1) of the Act, 21 U.S.C. section 360bbb-3(b)(1), unless the authorization is terminated or revoked sooner. Performed at [facility], [place] [address], [place], [place] [address]    Ct Abdomen Pelvis Wo Contrast  Result Date: 01/27/2019 CLINICAL DATA:  Evaluate for bowel obstruction. Pain after eating. Emesis yesterday. EXAM: CT ABDOMEN AND PELVIS WITHOUT CONTRAST TECHNIQUE: Multidetector CT imaging of the abdomen and pelvis was performed following the standard protocol without IV contrast. COMPARISON:  04/05/2017 FINDINGS: Lower chest: No acute abnormality. Moderate size hiatal hernia identified with greater than 50% intrathoracic stomach. Hepatobiliary: No liver abnormality. Previous cholecystectomy. Mild fusiform dilatation of the CBD measures up to 1.2 cm. Pancreas: Unremarkable. No pancreatic ductal dilatation or surrounding inflammatory changes. Spleen: Normal in size without focal abnormality. Adrenals/Urinary Tract: Normal appearance of the adrenal glands. No kidney mass, kidney stone or hydronephrosis identified. Urinary bladder appears normal. Stomach/Bowel: No evidence of bowel wall thickening,  distension or inflammation. The appendix is visualized and appears normal. No pathologic dilatation of the colon. Vascular/Lymphatic: Aortic atherosclerosis. No aneurysm. No abdominal or pelvic adenopathy. No inguinal adenopathy. Reproductive: Status post hysterectomy. No adnexal masses. Other: No abdominal wall hernia or abnormality. No abdominopelvic ascites. Musculoskeletal: No acute or significant osseous findings. Chronic postoperative changes from interbody fusion L4-5 and L5-S1 with posterior decompression noted. IMPRESSION: 1. No evidence for bowel obstruction. 2. Large hiatal hernia 3.  Aortic Atherosclerosis (ICD10-I70.0). 4. Status post cholecystectomy with increase caliber of the CBD. Electronically Signed   By: Kerby Moors M.D.   On: 01/27/2019 10:46    Pending Labs Unresulted Labs (From admission, onward)    Start     Ordered   01/28/19 0500  Protime-INR  Daily,   R     01/27/19 1525   01/28/19 0500  Comprehensive metabolic panel  Tomorrow morning,   R     01/27/19 1556   01/27/19 1042  Urine culture  Add-on,   AD     01/27/19 1041   Signed and Held  Basic metabolic panel  Tomorrow morning,   R     Signed and Held          Vitals/Pain Today's Vitals   01/27/19 1630 01/27/19 1700 01/27/19 1730 01/27/19 1800  BP: 125/72 111/63 (!) 105/54 116/60  Pulse: 89 81 88 92  Resp: 16 20 17 12   Temp:  SpO2: 98% 100% 100% 98%  PainSc:        Isolation Precautions No active isolations  Medications Medications  sodium chloride flush (NS) 0.9 % injection 3 mL (has no administration in time range)  Warfarin - Pharmacist Dosing Inpatient (has no administration in time range)  fentaNYL (SUBLIMAZE) injection 50 mcg (50 mcg Intravenous Given 01/27/19 1000)  ondansetron (ZOFRAN) injection 4 mg (4 mg Intravenous Given 01/27/19 0958)  sulfamethoxazole-trimethoprim (BACTRIM DS) 800-160 MG per tablet 1 tablet (1 tablet Oral Given 01/27/19 1223)  warfarin (COUMADIN) tablet 5 mg (5 mg  Oral Given 01/27/19 1816)    Mobility walks with device High fall risk   Focused Assessments GI   R Recommendations: See Admitting Provider Note  Report given to:   Additional Notes: From [facility] with reports of Xray that shows possible bowel obstruction. CT is negative. Urine appeared cloudy, sediments, mal-odorous (diagnosed with UTI)  1 person assist, sometimes (PRN meds ordered)

## 2019-01-27 NOTE — Progress Notes (Signed)
Pt getting agitated when RN asked to remove jeans to assess bottom, legs, and genitals. Pt refused! Pt refused to let RN remove shoes to check feet and to put on socks. RN informed pt that socks have grippys and pt stated that at the nursing home they instructed her to leave her shoes on so she doesn't fall, even when in bed, and she was not taking them off.   Eleanora Neighbor, RN

## 2019-01-27 NOTE — Progress Notes (Signed)
Spoke with pt about order for CPAP, pt confused and" has not ever heard of CPAP". OSA or home use of CPAP not listed in chart. RT will hold at this time.

## 2019-01-27 NOTE — ED Provider Notes (Addendum)
Medical screening examination/treatment/procedure(s) were conducted as a shared visit with non-physician practitioner(s) and myself.  I personally evaluated the patient during the encounter. Briefly, the patient is a 85 y.o. female with history of high cholesterol, hypertension, hiatal hernia, atrial fibrillation on blood thinners who presents the ED with abdominal pain.  Patient has had some nausea and vomiting.  Had a bowel movement this morning.  Has no major distention or focal abdominal pain on exam.  However will get a CT scan to further evaluate for obstruction versus other intra-abdominal process.  Lab work overall appears normal.  She denies any respiratory symptoms, no chest pain.  No urinary symptoms.  States that she mostly has symptoms when she is eating food especially spicy foods.  She has not had much of an appetite.  CT scan of the abdomen and pelvis is overall unremarkable.  No obstruction.  Urinalysis concerning for infection.  No signs to suggest sepsis.  Will treat urinary tract infection. Pt with dilated CBD to 12 mm but normal liver enzymes and lipase. Will discuss with GI. GI recommends admission for repeat labs, observation, believes CBD dilation is above expected. Wants to slowly advance and re-eval. May need EUS.  This chart was dictated using voice recognition software.  Despite best efforts to proofread,  errors can occur which can change the documentation meaning.     EKG Interpretation None           Lennice Sites, DO 01/27/19 [place], Charlotte Lardner, DO 01/27/19 1325

## 2019-01-28 ENCOUNTER — Encounter (HOSPITAL_COMMUNITY): Payer: Self-pay

## 2019-01-28 DIAGNOSIS — I5032 Chronic diastolic (congestive) heart failure: Secondary | ICD-10-CM | POA: Diagnosis present

## 2019-01-28 DIAGNOSIS — N3000 Acute cystitis without hematuria: Secondary | ICD-10-CM | POA: Diagnosis present

## 2019-01-28 DIAGNOSIS — F419 Anxiety disorder, unspecified: Secondary | ICD-10-CM | POA: Diagnosis present

## 2019-01-28 DIAGNOSIS — K838 Other specified diseases of biliary tract: Secondary | ICD-10-CM

## 2019-01-28 DIAGNOSIS — Z7901 Long term (current) use of anticoagulants: Secondary | ICD-10-CM | POA: Diagnosis not present

## 2019-01-28 DIAGNOSIS — K219 Gastro-esophageal reflux disease without esophagitis: Secondary | ICD-10-CM | POA: Diagnosis present

## 2019-01-28 DIAGNOSIS — R1013 Epigastric pain: Secondary | ICD-10-CM | POA: Diagnosis not present

## 2019-01-28 DIAGNOSIS — B9689 Other specified bacterial agents as the cause of diseases classified elsewhere: Secondary | ICD-10-CM | POA: Diagnosis present

## 2019-01-28 DIAGNOSIS — E039 Hypothyroidism, unspecified: Secondary | ICD-10-CM | POA: Diagnosis present

## 2019-01-28 DIAGNOSIS — Z881 Allergy status to other antibiotic agents status: Secondary | ICD-10-CM | POA: Diagnosis not present

## 2019-01-28 DIAGNOSIS — Z79890 Hormone replacement therapy (postmenopausal): Secondary | ICD-10-CM | POA: Diagnosis not present

## 2019-01-28 DIAGNOSIS — E669 Obesity, unspecified: Secondary | ICD-10-CM | POA: Diagnosis present

## 2019-01-28 DIAGNOSIS — Z8542 Personal history of malignant neoplasm of other parts of uterus: Secondary | ICD-10-CM | POA: Diagnosis not present

## 2019-01-28 DIAGNOSIS — E785 Hyperlipidemia, unspecified: Secondary | ICD-10-CM | POA: Diagnosis present

## 2019-01-28 DIAGNOSIS — Z6829 Body mass index (BMI) 29.0-29.9, adult: Secondary | ICD-10-CM | POA: Diagnosis not present

## 2019-01-28 DIAGNOSIS — M545 Low back pain: Secondary | ICD-10-CM | POA: Diagnosis present

## 2019-01-28 DIAGNOSIS — Z91041 Radiographic dye allergy status: Secondary | ICD-10-CM | POA: Diagnosis not present

## 2019-01-28 DIAGNOSIS — G8929 Other chronic pain: Secondary | ICD-10-CM | POA: Diagnosis present

## 2019-01-28 DIAGNOSIS — I4892 Unspecified atrial flutter: Secondary | ICD-10-CM | POA: Diagnosis present

## 2019-01-28 DIAGNOSIS — Z20828 Contact with and (suspected) exposure to other viral communicable diseases: Secondary | ICD-10-CM | POA: Diagnosis present

## 2019-01-28 DIAGNOSIS — R112 Nausea with vomiting, unspecified: Secondary | ICD-10-CM | POA: Diagnosis not present

## 2019-01-28 DIAGNOSIS — Z8249 Family history of ischemic heart disease and other diseases of the circulatory system: Secondary | ICD-10-CM | POA: Diagnosis not present

## 2019-01-28 DIAGNOSIS — R109 Unspecified abdominal pain: Secondary | ICD-10-CM | POA: Diagnosis not present

## 2019-01-28 DIAGNOSIS — I480 Paroxysmal atrial fibrillation: Secondary | ICD-10-CM | POA: Diagnosis present

## 2019-01-28 DIAGNOSIS — I110 Hypertensive heart disease with heart failure: Secondary | ICD-10-CM | POA: Diagnosis present

## 2019-01-28 DIAGNOSIS — K449 Diaphragmatic hernia without obstruction or gangrene: Secondary | ICD-10-CM | POA: Diagnosis present

## 2019-01-28 DIAGNOSIS — M810 Age-related osteoporosis without current pathological fracture: Secondary | ICD-10-CM | POA: Diagnosis present

## 2019-01-28 DIAGNOSIS — Z880 Allergy status to penicillin: Secondary | ICD-10-CM | POA: Diagnosis not present

## 2019-01-28 LAB — COMPREHENSIVE METABOLIC PANEL
ALT: 13 U/L (ref 0–44)
AST: 16 U/L (ref 15–41)
Albumin: 3 g/dL — ABNORMAL LOW (ref 3.5–5.0)
Alkaline Phosphatase: 55 U/L (ref 38–126)
Anion gap: 11 (ref 5–15)
BUN: 20 mg/dL (ref 8–23)
CO2: 21 mmol/L — ABNORMAL LOW (ref 22–32)
Calcium: 8.5 mg/dL — ABNORMAL LOW (ref 8.9–10.3)
Chloride: 105 mmol/L (ref 98–111)
Creatinine, Ser: 1.33 mg/dL — ABNORMAL HIGH (ref 0.44–1.00)
GFR calc Af Amer: 42 mL/min — ABNORMAL LOW (ref >60)
GFR calc non Af Amer: 37 mL/min — ABNORMAL LOW (ref >60)
Glucose, Bld: 113 mg/dL — ABNORMAL HIGH (ref 70–99)
Potassium: 3.5 mmol/L (ref 3.5–5.1)
Sodium: 137 mmol/L (ref 135–145)
Total Bilirubin: 0.9 mg/dL (ref 0.3–1.2)
Total Protein: 5.6 g/dL — ABNORMAL LOW (ref 6.5–8.1)

## 2019-01-28 LAB — PROTIME-INR
INR: 2.9 — ABNORMAL HIGH (ref 0.8–1.2)
Prothrombin Time: 29.5 seconds — ABNORMAL HIGH (ref 11.4–15.2)

## 2019-01-28 MED ORDER — WARFARIN SODIUM 3 MG PO TABS
3.0000 mg | ORAL_TABLET | Freq: Once | ORAL | Status: AC
  Administered 2019-01-28: 3 mg via ORAL
  Filled 2019-01-28: qty 1

## 2019-01-28 NOTE — TOC Initial Note (Signed)
Transition of Care [facility]) - Initial/Assessment Note    Patient Details  Name: Charlotte Castro MRN: [id number] Date of Birth: [birth year]  Transition of Care [facility]) CM/SW Contact:    Gelene Mink, [place] Phone Number: 01/28/2019, 4:25 PM  Clinical Narrative:                The patient is from [facility] [employer] [facility] [employer]. CSW met with the patient at bedside. CSW asked the patient if she was agreeable to returning back to [facility] to continue her rehab, she stated that she was agreeable. CSW asked the patient if she had any additional questions, she stated no. CSW thanked her for her time.   CSW will continue to follow and assist with discharge.      Expected Discharge Plan: Skilled Nursing Facility Barriers to Discharge: Continued Medical Work up   Patient Goals and CMS Choice Patient states their goals for this hospitalization and ongoing recovery are:: Pt will return back to [facility] once medically stable CMS Medicare.gov Compare Post Acute Care list provided to:: Patient Choice offered to / list presented to : Patient  Expected Discharge Plan and Services Expected Discharge Plan: [place] In-house Referral: Clinical Social Work Discharge Planning Services: NA Post Acute Care Choice: [place] Living arrangements for the past 2 months: [place]                 DME Arranged: N/A DME Agency: NA       HH Arranged: NA [place] Agency: NA        Prior Living Arrangements/Services Living arrangements for the past 2 months: [place] Lives with:: Facility Resident Patient language and need for interpreter reviewed:: No Do you feel safe going back to the place where you live?: Yes      Need for Family Participation in Patient Care: Yes (Comment) Care giver support system in place?: Yes (comment)   Criminal Activity/Legal Involvement Pertinent to Current Situation/Hospitalization: No - Comment as needed  Activities of  Daily Living Home Assistive Devices/Equipment: Cane (specify quad or straight), Walker (specify type) ADL Screening (condition at time of admission) Patient's cognitive ability adequate to safely complete daily activities?: Yes Is the patient deaf or have difficulty hearing?: No Does the patient have difficulty seeing, even when wearing glasses/contacts?: No Does the patient have difficulty concentrating, remembering, or making decisions?: No Patient able to express need for assistance with ADLs?: Yes Does the patient have difficulty dressing or bathing?: No Independently performs ADLs?: No Communication: Independent Dressing (OT): Needs assistance Is this a change from baseline?: Pre-admission baseline Grooming: Needs assistance Is this a change from baseline?: Pre-admission baseline Feeding: Independent Bathing: Needs assistance Is this a change from baseline?: Pre-admission baseline Toileting: Needs assistance Is this a change from baseline?: Pre-admission baseline In/Out Bed: Needs assistance Is this a change from baseline?: Pre-admission baseline Walks in Home: Needs assistance Is this a change from baseline?: Pre-admission baseline Does the patient have difficulty walking or climbing stairs?: Yes Weakness of Legs: Both Weakness of Arms/Hands: None  Permission Sought/Granted Permission sought to share information with : Case Manager Permission granted to share information with : Yes, Verbal Permission Granted  Share Information with NAME: [place] granted to share info w AGENCY: [place] granted to share info w Relationship: Son and Daughter     Emotional Assessment Appearance:: Appears stated age Attitude/Demeanor/Rapport: Engaged Affect (typically observed): Calm Orientation: : Oriented to Self, Oriented to Place,  Oriented to  Time, Oriented to Situation Alcohol / Substance Use: Not Applicable Psych Involvement: No  (comment)  Admission diagnosis:  Hiatal hernia [K44.9] Epigastric abdominal pain [R10.13] Acute cystitis without hematuria [N30.00] Non-intractable vomiting with nausea, unspecified vomiting type [R11.2] Patient Active Problem List   Diagnosis Date Noted  . Intractable nausea and vomiting 01/27/2019  . AKI (acute kidney injury) ([place]) 01/14/2019  . B12 deficiency 08/08/2018  . Hypothyroidism 08/08/2018  . Endometrial cancer ([place]) 08/05/2017  . Toe ulcer, left, with unspecified severity ([place]) 08/04/2017  . 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 Postmenopausal bleeding 08/01/2017  . Delirium 04/14/2016  . Tremor 04/14/2016  . Chronic diastolic CHF (congestive heart failure) ([place]) 04/10/2016  . Essential hypertension, benign 01/18/2014  . Long term current use of anticoagulant therapy 01/18/2014  . Permanent atrial fibrillation 06/10/2011   PCP:  Isaac Bliss, Rayford Halsted, MD Pharmacy:   [facility], [place] [address] [place] [address] Phone: [phone] Fax: [phone]     Social Determinants of Health (SDOH) Interventions    Readmission Risk Interventions No flowsheet data found.

## 2019-01-28 NOTE — Progress Notes (Signed)
Patient has tolerated a soft diet today, without any pain or vomiting.  I continue to think that the most likely cause for her pain and transient food intolerance symptoms at the nursing facility was some sort of mechanical obstruction related to her large hiatal hernia, perhaps a torsion or volvulus, which has spontaneously resolved.  However, this is certainly conjectural since no direct radiographic evidence of a complication of her hiatal hernia has been obtained.  Recommendation: I would continue to observe the patient on a soft diet for perhaps another day or so, after which I think it would be reasonable for her to return to her skilled nursing facility.  I discussed with the patient the option of surgical correction of the hiatal hernia, but in my opinion that would not be advisable unless her symptoms become much more frequent, in part because we do not have confirmation that the hiatal hernia is the cause of her symptoms, and of course with her advanced age she would be a moderately high surgical risk.  We will sign off.  Please call us if the patient has further GI problems on this admission, or if questions arise, or if we can be of further assistance with this patient.  Cleotis Nipper, M.D. Pager [phone] If no answer or after 5 PM call [phone]

## 2019-01-28 NOTE — Progress Notes (Addendum)
ANTICOAGULATION CONSULT NOTE - Initial Consult  Pharmacy Consult for warfarin Indication: atrial fibrillation  Allergies  Allergen Reactions  . Iodinated Diagnostic Agents Shortness Of Breath and Other (See Comments)    "Allergic," per [facility]- Causes headaches, also  . Zosyn [Piperacillin Sod-Tazobactam So] Other (See Comments)    "Allergic," per MAR  . Penicillins Other (See Comments)    Tolerated Zosyn Oct 2018, but "Allergic," per [facility] Did it involve swelling of the face/tongue/throat, SOB, or low BP? Unk Did it involve sudden or severe rash/hives, skin peeling, or any reaction on the inside of your mouth or nose? Unk Did you need to seek medical attention at a hospital or doctor's office? Unk When did it last happen? Unk If all above answers are "NO", may proceed with cephalosporin use.    Vital Signs: Temp: 98.3 F (36.8 C) (08/16 1001) Temp Source: Oral (08/16 1001) BP: 134/65 (08/16 1001) Pulse Rate: 107 (08/16 1001)  Labs: Recent Labs    01/27/19 0854 01/27/19 0941 01/28/19 0621  HGB 14.9  --   --   HCT 42.2  --   --   PLT 227  --   --   LABPROT  --  25.2* 29.5*  INR  --  2.3* 2.9*  CREATININE 1.15*  --  1.33*    Estimated Creatinine Clearance: 36.7 mL/min (A) (by C-G formula based on SCr of 1.33 mg/dL (H)).   Medical History: Past Medical History:  Diagnosis Date  . Anemia    history of  . Anxiety   . Arthritis    "left knee" (04/05/2017)  . Atrial fibrillation ([place])   . Atrial flutter ([place])    typical appearing  . Atrial tachycardia ([place])    ablated 11/17/10  by JA  from the [facility] of the aorta  . Chest pain on exertion   . CHF (congestive heart failure) ([place])   . Chronic lower back pain   . Chronic nausea   . Dizziness    chronic and of an unclear etiology  . Dyspnea   . Endometrial cancer (HCC)    grade 1  . Gallstone pancreatitis   . Gastritis   . GERD (gastroesophageal reflux disease)   . History of blood transfusion ~ 1948  . History of  hiatal hernia   . HTN (hypertension)   . Hyperlipemia   . Hypothyroidism   . Left leg numbness   . Obesity   . Osteoporosis   . PMB (postmenopausal bleeding)   . Sinus headache   . Toe ulcer ([place])    left 3rd toe  . Vitamin D deficiency     Medications:  Scheduled:  . levothyroxine  88 mcg Oral QAC breakfast  . metoprolol tartrate  50 mg Oral BID WC  . sodium chloride flush  3 mL Intravenous Once  . Warfarin - Pharmacist Dosing Inpatient   Does not apply q1800    Assessment: 67 yoF presents to ED with abdominal pain. On warfarin 5 mg daily PTA for Afib. Pharmacy consulted to dose warfarin for Afib. INR 2.9 is therapeutic on day 2 after warfarin 5mg . INR is at goal though at the upper end of the therapeutic range. Patient received one dose of Bactrim DS in the ED on 8/15 and started on ceftriaxone for potential UTI. Both medications increase INR, particularly Bactrim, and likely caused the INR to increase from 2.3 to 2.9. Will give lower dose of warfarin tonight given patient has not fully eliminated Bactrim and expect INR  to potentially uptrend.   Hgb and plt wnl on admission, no CBC obtained 8/16. No reported bleeding   PTA warfarin regimen: 5mg  daily (per SNF MAR)  Goal of Therapy:  INR 2-3 Monitor platelets by anticoagulation protocol: Yes   Plan:  Warfarin 3mg  x1 on 8/16 Monitor daily PT/INR, s/sx bleeding  Cristela Felt, PharmD PGY1 Pharmacy Resident [employer]: [phone]  01/28/2019,10:37 AM

## 2019-01-28 NOTE — Progress Notes (Signed)
New Admission Note:  Arrival Method: Pt came by wheelchair from ED around 2045.  Mental Orientation: Alert and oriented x 3, with some confusion Telemetry: None Assessment: Completed Skin: Completed what pt would allow this RN to see. Can refer to previous note.  IV: Left hand S.L. Pain: Denies Tubes: None Safety Measures: Safety Fall Prevention Plan was given, discussed  Admission: Completed [facility] Orientation: Patient has been orientated to the room, unit and the staff. Family: None  Orders have been reviewed and implemented. Will continue to monitor the patient. Call light has been placed within reach and bed alarm has been activated.   Pt was placed on a low bed.   Perry Mount, RN  Phone Number: [phone]

## 2019-01-28 NOTE — Progress Notes (Addendum)
PROGRESS NOTE  Charlotte Castro IZT:[id number] DOB: Dec 14, 1932 DOA: 01/27/2019 PCP: Isaac Bliss, Rayford Halsted, MD  HPI/Recap of past 71 hours: 85 year old female with past medical history of atrial fibrillation on Coumadin, diastolic heart failure and hypertension status post cholecystectomy in 2018 for recurrent gallstone pancreatitis brought in on 8/15 with 3 days of nausea and vomiting abdominal pain.  CT scan noted no evidence of bowel obstruction, but did note enlarged common bile duct with slightly elevated bilirubin.  Patient brought in for further evaluation.  GI consulted and recommended that given patient's advanced age, more conservative management would be utilized.  Patient's diet slowly advanced, placed on antiemetics.  She has been so far tolerable of full liquids.  She received 1 meal so far of soft bland diet in which she ate only a small amount, but tolerated this somewhat.  Also found to have large UTI.  Patient doing okay.  Tolerating full liquids, hesitant to try solid food (patient seen before started on soft bland diet)  Assessment/Plan: Active Problems:   Intractable nausea and vomiting in patient with previous history of gallstone pancreatitis status post cholecystectomy but with findings of dilated common bile duct and mild increase in bilirubin: Her bilirubin looks better today.  Slowly advancing meal.  Patient is quite frail and I am concerned given our plans for conservative management to try to aggressively return her back to her facility without ensuring that she is able to fully tolerate solid food.  Continue soft bland diet.  If she has no further episodes overnight, discharged back to assisted living in the morning  UTI: Continue Rocephin, completes course tomorrow  Overweight: Patient meets criteria BMI greater than 25   Code Status: Full code  Family Communication: Left message for family  Disposition Plan: Back to assisted living tomorrow if continues to  tolerate solid food   Consultants:  GI  Procedures:  None  Antimicrobials:  IV Rocephin 8/15-present  DVT prophylaxis: Continue chronic Coumadin   Objective: Vitals:   01/28/19 0845 01/28/19 1001  BP: 134/65 134/65  Pulse: (!) 107 (!) 107  Resp: 18 18  Temp: 98.3 F (36.8 C) 98.3 F (36.8 C)  SpO2: 98%     Intake/Output Summary (Last 24 hours) at 01/28/2019 1508 Last data filed at 01/28/2019 0930 Gross per 24 hour  Intake 1692.43 ml  Output 420 ml  Net 1272.43 ml   Filed Weights   01/27/19 2049 01/28/19 1001  Weight: 88.8 kg 88.8 kg   Body mass index is 29.77 kg/m.  Exam:   General: Alert and oriented x3, no acute distress  Cardiovascular: Regular rate and rhythm, S1-S2  Respiratory: Clear to auscultation bilaterally  Abdomen: Soft nontender, nondistended, positive bowel sounds  Musculoskeletal: No clubbing or cyanosis or edema  Skin: No skin breaks, tears or lesions  Psychiatry: Appropriate, no evidence of psychoses   Data Reviewed: CBC: Recent Labs  Lab 01/27/19 0854  WBC 8.9  HGB 14.9  HCT 42.2  MCV 93.6  PLT 382   Basic Metabolic Panel: Recent Labs  Lab 01/27/19 0854 01/28/19 0621  NA 141 137  K 4.4 3.5  CL 104 105  CO2 25 21*  GLUCOSE 112* 113*  BUN 20 20  CREATININE 1.15* 1.33*  CALCIUM 10.0 8.5*   GFR: Estimated Creatinine Clearance: 36.7 mL/min (A) (by C-G formula based on SCr of 1.33 mg/dL (H)). Liver Function Tests: Recent Labs  Lab 01/27/19 0854 01/28/19 0621  AST 23 16  ALT 15 13  ALKPHOS 61 55  BILITOT 1.4* 0.9  PROT 6.7 5.6*  ALBUMIN 3.6 3.0*   Recent Labs  Lab 01/27/19 0854  LIPASE 37   No results for input(s): AMMONIA in the last 168 hours. Coagulation Profile: Recent Labs  Lab 01/27/19 0941 01/28/19 0621  INR 2.3* 2.9*   Cardiac Enzymes: No results for input(s): CKTOTAL, CKMB, CKMBINDEX, TROPONINI in the last 168 hours. BNP (last 3 results) No results for input(s): PROBNP in the last  8760 hours. HbA1C: No results for input(s): HGBA1C in the last 72 hours. CBG: No results for input(s): GLUCAP in the last 168 hours. Lipid Profile: No results for input(s): CHOL, HDL, LDLCALC, TRIG, CHOLHDL, LDLDIRECT in the last 72 hours. Thyroid Function Tests: No results for input(s): TSH, T4TOTAL, FREET4, T3FREE, THYROIDAB in the last 72 hours. Anemia Panel: No results for input(s): VITAMINB12, FOLATE, FERRITIN, TIBC, IRON, RETICCTPCT in the last 72 hours. Urine analysis:    Component Value Date/Time   COLORURINE AMBER (A) 01/27/2019 0930   APPEARANCEUR TURBID (A) 01/27/2019 0930   LABSPEC 1.021 01/27/2019 0930   PHURINE 6.0 01/27/2019 0930   GLUCOSEU NEGATIVE 01/27/2019 0930   HGBUR SMALL (A) 01/27/2019 0930   BILIRUBINUR NEGATIVE 01/27/2019 0930   KETONESUR NEGATIVE 01/27/2019 0930   PROTEINUR 100 (A) 01/27/2019 0930   UROBILINOGEN 2.0 (H) 11/18/2010 0019   NITRITE NEGATIVE 01/27/2019 0930   LEUKOCYTESUR MODERATE (A) 01/27/2019 0930   Sepsis Labs: @LABRCNTIP (procalcitonin:4,lacticidven:4)  ) Recent Results (from the past 240 hour(s))  SARS CORONAVIRUS 2 Nasal Swab Aptima Multi Swab     Status: None   Collection Time: 01/18/19  4:13 PM   Specimen: Aptima Multi Swab; Nasal Swab  Result Value Ref Range Status   SARS Coronavirus 2 NEGATIVE NEGATIVE Final    Comment: (NOTE) SARS-CoV-2 target nucleic acids are NOT DETECTED. The SARS-CoV-2 RNA is generally detectable in upper and lower respiratory specimens during the acute phase of infection. Negative results do not preclude SARS-CoV-2 infection, do not rule out co-infections with other pathogens, and should not be used as the sole basis for treatment or other patient management decisions. Negative results must be combined with clinical observations, patient history, and epidemiological information. The expected result is Negative. Fact Sheet for Patients: SugarRoll.be Fact Sheet for  Healthcare Providers: https://www.woods-mathews.com/ This test is not yet approved or cleared by the [place] FDA and  has been authorized for detection and/or diagnosis of SARS-CoV-2 by FDA under an Emergency Use Authorization (EUA). This EUA will remain  in effect (meaning this test can be used) for the duration of the COVID-19 declaration under Section 56 4(b)(1) of the Act, 21 U.S.C. section 360bbb-3(b)(1), unless the authorization is terminated or revoked sooner. Performed at [facility], [place] [address], [place], [place] [address]   SARS Coronavirus 2 [facility] order, Performed in [facility] hospital lab) Nasopharyngeal Nasopharyngeal Swab     Status: None   Collection Time: 01/27/19  1:26 PM   Specimen: Nasopharyngeal Swab  Result Value Ref Range Status   SARS Coronavirus 2 NEGATIVE NEGATIVE Final    Comment: (NOTE) If result is NEGATIVE SARS-CoV-2 target nucleic acids are NOT DETECTED. The SARS-CoV-2 RNA is generally detectable in upper and lower  respiratory specimens during the acute phase of infection. The lowest  concentration of SARS-CoV-2 viral copies this assay can detect is 250  copies / mL. A negative result does not preclude SARS-CoV-2 infection  and should not be used as the sole basis for treatment or  other  patient management decisions.  A negative result may occur with  improper specimen collection / handling, submission of specimen other  than nasopharyngeal swab, presence of viral mutation(s) within the  areas targeted by this assay, and inadequate number of viral copies  (<250 copies / mL). A negative result must be combined with clinical  observations, patient history, and epidemiological information. If result is POSITIVE SARS-CoV-2 target nucleic acids are DETECTED. The SARS-CoV-2 RNA is generally detectable in upper and lower  respiratory specimens dur ing the acute phase of infection.  Positive  results are indicative of active  infection with SARS-CoV-2.  Clinical  correlation with patient history and other diagnostic information is  necessary to determine patient infection status.  Positive results do  not rule out bacterial infection or co-infection with other viruses. If result is PRESUMPTIVE POSTIVE SARS-CoV-2 nucleic acids MAY BE PRESENT.   A presumptive positive result was obtained on the submitted specimen  and confirmed on repeat testing.  While 2019 novel coronavirus  (SARS-CoV-2) nucleic acids may be present in the submitted sample  additional confirmatory testing may be necessary for epidemiological  and / or clinical management purposes  to differentiate between  SARS-CoV-2 and other Sarbecovirus currently known to infect humans.  If clinically indicated additional testing with an alternate test  methodology [phone]) is advised. The SARS-CoV-2 RNA is generally  detectable in upper and lower respiratory sp ecimens during the acute  phase of infection. The expected result is Negative. Fact Sheet for Patients:  StrictlyIdeas.no Fact Sheet for Healthcare Providers: BankingDealers.co.za This test is not yet approved or cleared by the [place] FDA and has been authorized for detection and/or diagnosis of SARS-CoV-2 by FDA under an Emergency Use Authorization (EUA).  This EUA will remain in effect (meaning this test can be used) for the duration of the COVID-19 declaration under Section 564(b)(1) of the Act, 21 U.S.C. section 360bbb-3(b)(1), unless the authorization is terminated or revoked sooner. Performed at [facility], [place] [address], [place], [place] [address]   MRSA PCR Screening     Status: None   Collection Time: 01/27/19  9:03 PM   Specimen: Nasal Mucosa; Nasopharyngeal  Result Value Ref Range Status   MRSA by PCR NEGATIVE NEGATIVE Final    Comment:        The GeneXpert MRSA Assay (FDA approved for NASAL specimens only), is one  component of a comprehensive MRSA colonization surveillance program. It is not intended to diagnose MRSA infection nor to guide or monitor treatment for MRSA infections. Performed at [facility], [place] [address] [place], [place] [address]       Studies: No results found.  Scheduled Meds: . levothyroxine  88 mcg Oral QAC breakfast  . metoprolol tartrate  50 mg Oral BID WC  . sodium chloride flush  3 mL Intravenous Once  . warfarin  3 mg Oral ONCE-1800  . Warfarin - Pharmacist Dosing Inpatient   Does not apply q1800    Continuous Infusions: . cefTRIAXone (ROCEPHIN)  IV 1 g (01/27/19 2158)  . dextrose 5 % and 0.45% NaCl 75 mL/hr at 01/27/19 2057     LOS: 0 days     Annita Brod, MD [facility]  To reach me or the doctor on call, go to: [email or web address] Password [facility]  01/28/2019, 3:08 PM

## 2019-01-28 NOTE — NC FL2 (Signed)
[place] LEVEL OF CARE SCREENING TOOL     IDENTIFICATION  Patient Name: Charlotte Castro Birthdate: [birth year] Sex: female Admission Date (Current Location): 01/27/2019  [facility] and [place] Number:  Herbalist and Address:  [facility] [place], [place] [address], [place], [place] [address]      Provider Number: [id number]  Attending Physician Name and Address:  Annita Brod, MD  Relative Name and Phone Number:  Sueann, Brownley, [phone] & Georgette Shell, Daughter, [phone]    Current Level of Care: Hospital Recommended Level of Care: [place] Prior Approval Number:    Date Approved/Denied:   PASRR Number: [id number]  Discharge Plan: SNF    Current Diagnoses: Patient Active Problem List   Diagnosis Date Noted  . Intractable nausea and vomiting 01/27/2019  . AKI (acute kidney injury) ([place]) 01/14/2019  . B12 deficiency 08/08/2018  . Hypothyroidism 08/08/2018  . Endometrial cancer ([place]) 08/05/2017  . Toe ulcer, left, with unspecified severity ([place]) 08/04/2017  . 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 Postmenopausal bleeding 08/01/2017  . Delirium 04/14/2016  . Tremor 04/14/2016  . Chronic diastolic CHF (congestive heart failure) ([place]) 04/10/2016  . Essential hypertension, benign 01/18/2014  . Long term current use of anticoagulant therapy 01/18/2014  . Permanent atrial fibrillation 06/10/2011    Orientation RESPIRATION BLADDER Height & Weight     Self, Situation, Place  Normal Continent Weight: 195 lb 12.3 oz (88.8 kg) Height:  5\' 8"  (172.7 cm)  BEHAVIORAL SYMPTOMS/MOOD NEUROLOGICAL BOWEL NUTRITION STATUS      Continent Diet(Soft food diet, thin liquids)  AMBULATORY STATUS COMMUNICATION OF NEEDS Skin   Limited Assist Verbally Normal                       Personal Care Assistance Level of Assistance  Bathing, Feeding, Dressing, Total care  Bathing Assistance: Limited assistance Feeding assistance: Independent Dressing Assistance: Limited assistance Total Care Assistance: Limited assistance   Functional Limitations Info  Sight, Hearing, Speech Sight Info: Adequate Hearing Info: Adequate Speech Info: Adequate    SPECIAL CARE FACTORS FREQUENCY                       Contractures Contractures Info: Not present    Additional Factors Info  Code Status, Allergies, Psychotropic Code Status Info: Full Code Allergies Info: Iodinated Diagnostic Agents, Zosyn (Piperacillin Sod-tazobactam So), Penicillins Psychotropic Info: klonopin .5mg  3x daily (PRN)         Current Medications (01/28/2019):  This is the current hospital active medication list Current Facility-Administered Medications  Medication Dose Route Frequency Provider Last Rate Last Dose  . cefTRIAXone (ROCEPHIN) 1 g in sodium chloride 0.9 % 100 mL IVPB  1 g Intravenous Q24H Merton Border, MD 200 mL/hr at 01/27/19 2158 1 g at 01/27/19 2158  . clonazePAM (KLONOPIN) tablet 0.5 mg  0.5 mg Oral TID PRN Merton Border, MD   0.5 mg at 01/27/19 2017  . dextrose 5 %-0.45 % sodium chloride infusion   Intravenous Continuous Merton Border, MD 75 mL/hr at 01/27/19 2057    . HYDROcodone-acetaminophen (NORCO/VICODIN) 5-325 MG per tablet 1 tablet  1 tablet Oral Q4H PRN Merton Border, MD      . levothyroxine (SYNTHROID) tablet 88 mcg  88 mcg Oral QAC breakfast Merton Border, MD   88 mcg at 01/28/19 0936  . metoprolol tartrate (LOPRESSOR) tablet 50  mg  50 mg Oral BID WC Merton Border, MD   50 mg at 01/28/19 0936  . morphine 2 MG/ML injection 1 mg  1 mg Intravenous Q4H PRN Merton Border, MD      . ondansetron (ZOFRAN) tablet 4 mg  4 mg Oral Q6H PRN Merton Border, MD       Or  . ondansetron (ZOFRAN) injection 4 mg  4 mg Intravenous Q6H PRN Merton Border, MD      . sodium chloride flush (NS) 0.9 % injection 3 mL  3 mL Intravenous Once Merton Border, MD      . warfarin (COUMADIN) tablet 3 mg  3 mg  Oral ONCE-1800 Henri Medal, RPH      . Warfarin - Pharmacist Dosing Inpatient   Does not apply q1800 Mirian Capuchin, [facility]         Discharge Medications: Please see discharge summary for a list of discharge medications.  Relevant Imaging Results:  Relevant Lab Results:   Additional Information SSN: [id number]  Philippa Chester Roosevelt Eimers, LCSWA

## 2019-01-29 DIAGNOSIS — K449 Diaphragmatic hernia without obstruction or gangrene: Secondary | ICD-10-CM

## 2019-01-29 LAB — PROTIME-INR
INR: 3.5 — ABNORMAL HIGH (ref 0.8–1.2)
Prothrombin Time: 34.9 seconds — ABNORMAL HIGH (ref 11.4–15.2)

## 2019-01-29 LAB — GLUCOSE, CAPILLARY: Glucose-Capillary: 148 mg/dL — ABNORMAL HIGH (ref 70–99)

## 2019-01-29 MED ORDER — PROMETHAZINE HCL 25 MG/ML IJ SOLN
12.5000 mg | Freq: Four times a day (QID) | INTRAMUSCULAR | Status: DC | PRN
  Administered 2019-01-29: 12.5 mg via INTRAVENOUS
  Filled 2019-01-29: qty 1

## 2019-01-29 MED ORDER — WARFARIN SODIUM 5 MG PO TABS: 5.0000 mg | ORAL_TABLET | Freq: Every day | ORAL | Status: DC

## 2019-01-29 MED ORDER — ALUM & MAG HYDROXIDE-SIMETH 200-200-20 MG/5ML PO SUSP: 30.0000 mL | ORAL | Status: DC | PRN

## 2019-01-29 NOTE — Progress Notes (Signed)
ANTICOAGULATION CONSULT NOTE - Initial Consult  Pharmacy Consult for warfarin Indication: atrial fibrillation  Allergies  Allergen Reactions  . Iodinated Diagnostic Agents Shortness Of Breath and Other (See Comments)    "Allergic," per [facility]- Causes headaches, also  . Zosyn [Piperacillin Sod-Tazobactam So] Other (See Comments)    "Allergic," per MAR  . Penicillins Other (See Comments)    Tolerated Zosyn Oct 2018, but "Allergic," per [facility] Did it involve swelling of the face/tongue/throat, SOB, or low BP? Unk Did it involve sudden or severe rash/hives, skin peeling, or any reaction on the inside of your mouth or nose? Unk Did you need to seek medical attention at a hospital or doctor's office? Unk When did it last happen? Unk If all above answers are "NO", may proceed with cephalosporin use.    Vital Signs: Temp: 97.7 F (36.5 C) (08/17 0841) Temp Source: Oral (08/17 0841) BP: 126/69 (08/17 0841) Pulse Rate: 79 (08/17 0841)  Labs: Recent Labs    01/27/19 0854 01/27/19 0941 01/28/19 0621 01/29/19 0655  HGB 14.9  --   --   --   HCT 42.2  --   --   --   PLT 227  --   --   --   LABPROT  --  25.2* 29.5* 34.9*  INR  --  2.3* 2.9* 3.5*  CREATININE 1.15*  --  1.33*  --     Estimated Creatinine Clearance: 36.7 mL/min (A) (by C-G formula based on SCr of 1.33 mg/dL (H)).   Medical History: Past Medical History:  Diagnosis Date  . Anemia    history of  . Anxiety   . Arthritis    "left knee" (04/05/2017)  . Atrial fibrillation ([place])   . Atrial flutter ([place])    typical appearing  . Atrial tachycardia ([place])    ablated 11/17/10  by JA  from the [facility] of the aorta  . Chest pain on exertion   . CHF (congestive heart failure) ([place])   . Chronic lower back pain   . Chronic nausea   . Dizziness    chronic and of an unclear etiology  . Dyspnea   . Endometrial cancer (HCC)    grade 1  . Gallstone pancreatitis   . Gastritis   . GERD (gastroesophageal reflux disease)   . History of  blood transfusion ~ 1948  . History of hiatal hernia   . HTN (hypertension)   . Hyperlipemia   . Hypothyroidism   . Left leg numbness   . Obesity   . Osteoporosis   . PMB (postmenopausal bleeding)   . Sinus headache   . Toe ulcer ([place])    left 3rd toe  . Vitamin D deficiency     Medications:  Scheduled:  . levothyroxine  88 mcg Oral QAC breakfast  . metoprolol tartrate  50 mg Oral BID WC  . sodium chloride flush  3 mL Intravenous Once  . Warfarin - Pharmacist Dosing Inpatient   Does not apply q1800    Assessment: 55 yoF presents to ED with abdominal pain. On warfarin 5 mg daily PTA for Afib. Pharmacy consulted to dose warfarin for Afib. INR 2.9 is therapeutic on day 2 after warfarin 5mg . INR is at goal though at the upper end of the therapeutic range. Patient received one dose of Bactrim DS in the ED on 8/15 and started on ceftriaxone for potential UTI. Both medications increase INR, particularly Bactrim, and likely caused the INR to increase from 2.3 >> 2.9>>3.5. Will  hold warfarin tonight given patient has not fully eliminated Bactrim and expect INR to potentially uptrend.   Hgb and plt wnl on admission, no CBC obtained 8/16. No reported bleeding   PTA warfarin regimen: 5mg  daily (per SNF MAR)  Goal of Therapy:  INR 2-3 Monitor platelets by anticoagulation protocol: Yes   Plan:  Hold warfarin tonight Monitor daily PT/INR, s/sx bleeding  Vasili Fok A. Levada Dy, PharmD, BCPS, FNKF Clinical Pharmacist [place] Please utilize Amion for appropriate phone number to reach the unit pharmacist ([place])    01/29/2019,9:08 AM

## 2019-01-29 NOTE — Progress Notes (Signed)
PROGRESS NOTE    Charlotte Castro  ZSW:[id number] DOB: 1932-08-14 DOA: 01/27/2019 PCP: Isaac Bliss, Rayford Halsted, MD    Brief Narrative:   Charlotte Castro  is a 85 y.o. female, resident of an assisted living facility with past medical history significant for A. fib, on Coumadin, congestive heart failure, hypertension and status post cholecystectomy in 2018 for recurrent gallstone pancreatitis , presenting with 3 days history of nausea vomiting with abdominal pain.  Her abdominal pain is epigastric/umbilical region and worse with meals.  Her bowel movements are normal.  No chest pains shortness of breath, urinary symptoms, fever or chills.    ER work-up showed slightly elevated bilirubin, normal CBC CT of the abdomen which showed no evidence of bowel obstruction but enlarged common bile duct. Discussed with Dr. Cristina Gong from GI who advised admission for observation and advancing diet slowly.  Further work-up will be needed if patient cannot tolerate advancing diet.  Her nurse told me that she has episodes of anxiety and she would like antianxiety medications so we will start the patient on Klonopin as needed.  Assessment & Plan:   Active Problems:   Hiatal hernia  Intractable nausea and vomiting Patient presenting from her SNF with intractable nausea and vomiting, with previous history of gallstone pancreatitis status post cholecystectomy.  On admission, her bilirubin was noted to be slightly elevated with a dilation of her common bile duct.  Gastroenterology was consulted and followed during the hospital course.  They believe that her food intolerance symptoms were likely related to a mechanical obstruction from her large hiatal hernia versus torsion/volvulus which seemed to have spontaneous resolve.  She had been tolerating her diet, but this afternoon her nausea and vomiting has unfortunately returned.  Unfortunately, she is a moderately high surgical risk given her advanced age. --Hold off on  discharge back to SNF which was planned for today --We will continue soft diet --Continue Zofran, will add Phenergan prn --Continue IV fluid hydration --Repeat CMP and KUB in the a.m. --May consider addition of Reglan versus erythromycin to assist motility  Acute cystitis without hematuria GNR UTI --Completed 3-day course of IV ceftriaxone on 01/29/2019 --Further urine culture identification and susceptibilities pending  Paroxysmal atrial fibrillation Currently rate controlled, continue metoprolol tartrate 50 mg p.o. twice daily. --Continue Coumadin (goal INR 2.0-3.0 for pAFIB), INR up to 3.5 today --Pharmacy consulted for assistance with dosing/monitoring Coumadin --INR rise thought to be related to medication side effect with Bactrim per pharmacy --Pharmacy holding Coumadin dose tonight --Repeat INR in the a.m.  Hypothyroidism Continue Synthroid 88 mcg p.o. daily.  Obesity: BMI 29.77.  Discussed with her regarding needs for lifestyle changes and weight loss as this complicates all facets of care.  DVT prophylaxis: Coumadin Code Status: Full code Family Communication: None Disposition Plan: Anticipate discharge back to [facility] in 1-2 days   Consultants:   Gi - Dr. Cristina Gong - signed off 8/16  Procedures:   none  Antimicrobials:   Ceftriaxone 8/15 - 8/17   Subjective: Patient seen and examined at bedside early this morning, was tolerating diet over the past day.  Called by nursing this afternoon regarding return of intractable nausea and vomiting, and subsequently discharged was canceled.  Patient with no other complaints at this time.  Denies headache, no fever/chills/night sweats, no diarrhea, no chest pain, no palpitations, no shortness of breath, no abdominal pain, no weakness, no paresthesia.  No other acute events/concerns overnight or today per nursing staff.  Objective: Vitals:  01/28/19 1628 01/28/19 1933 01/29/19 0344 01/29/19 0841  BP: 114/64 121/79  130/72 126/69  Pulse: 78 76 85 79  Resp: 18 20 16 20   Temp: 98.3 F (36.8 C) 98.2 F (36.8 C) 97.7 F (36.5 C) 97.7 F (36.5 C)  TempSrc: Oral Oral Oral Oral  SpO2: 98% 98% 97% 100%  Weight:      Height:        Intake/Output Summary (Last 24 hours) at 01/29/2019 1440 Last data filed at 01/29/2019 1204 Gross per 24 hour  Intake 970.74 ml  Output 775 ml  Net 195.74 ml   Filed Weights   01/27/19 2049 01/28/19 1001  Weight: 88.8 kg 88.8 kg    Examination:  General exam: Appears calm and comfortable  Respiratory system: Clear to auscultation. Respiratory effort normal. Cardiovascular system: S1 & S2 heard, RRR. No JVD, murmurs, rubs, gallops or clicks. No pedal edema. Gastrointestinal system: Abdomen is nondistended, soft and nontender. No organomegaly or masses felt. Normal bowel sounds heard. Central nervous system: Alert and oriented. No focal neurological deficits. Extremities: Symmetric 5 x 5 power. Skin: No rashes, lesions or ulcers Psychiatry: Judgement and insight appear normal. Mood & affect appropriate.     Data Reviewed: I have personally reviewed following labs and imaging studies  CBC: Recent Labs  Lab 01/27/19 0854  WBC 8.9  HGB 14.9  HCT 42.2  MCV 93.6  PLT 009   Basic Metabolic Panel: Recent Labs  Lab 01/27/19 0854 01/28/19 0621  NA 141 137  K 4.4 3.5  CL 104 105  CO2 25 21*  GLUCOSE 112* 113*  BUN 20 20  CREATININE 1.15* 1.33*  CALCIUM 10.0 8.5*   GFR: Estimated Creatinine Clearance: 36.7 mL/min (A) (by C-G formula based on SCr of 1.33 mg/dL (H)). Liver Function Tests: Recent Labs  Lab 01/27/19 0854 01/28/19 0621  AST 23 16  ALT 15 13  ALKPHOS 61 55  BILITOT 1.4* 0.9  PROT 6.7 5.6*  ALBUMIN 3.6 3.0*   Recent Labs  Lab 01/27/19 0854  LIPASE 37   No results for input(s): AMMONIA in the last 168 hours. Coagulation Profile: Recent Labs  Lab 01/27/19 0941 01/28/19 0621 01/29/19 0655  INR 2.3* 2.9* 3.5*   Cardiac  Enzymes: No results for input(s): CKTOTAL, CKMB, CKMBINDEX, TROPONINI in the last 168 hours. BNP (last 3 results) No results for input(s): PROBNP in the last 8760 hours. HbA1C: No results for input(s): HGBA1C in the last 72 hours. CBG: No results for input(s): GLUCAP in the last 168 hours. Lipid Profile: No results for input(s): CHOL, HDL, LDLCALC, TRIG, CHOLHDL, LDLDIRECT in the last 72 hours. Thyroid Function Tests: No results for input(s): TSH, T4TOTAL, FREET4, T3FREE, THYROIDAB in the last 72 hours. Anemia Panel: No results for input(s): VITAMINB12, FOLATE, FERRITIN, TIBC, IRON, RETICCTPCT in the last 72 hours. Sepsis Labs: No results for input(s): PROCALCITON, LATICACIDVEN in the last 168 hours.  Recent Results (from the past 240 hour(s))  Urine culture     Status: Abnormal (Preliminary result)   Collection Time: 01/27/19 10:42 AM   Specimen: Urine, Random  Result Value Ref Range Status   Specimen Description URINE, RANDOM  Final   Special Requests   Final    NONE Performed at [facility], [address], [place], [place] [address]    Culture >=100,000 COLONIES/mL GRAM NEGATIVE RODS (A)  Final   Report Status PENDING  Incomplete  SARS Coronavirus 2 [facility] order, Performed in [facility] hospital lab) Nasopharyngeal  Nasopharyngeal Swab     Status: None   Collection Time: 01/27/19  1:26 PM   Specimen: Nasopharyngeal Swab  Result Value Ref Range Status   SARS Coronavirus 2 NEGATIVE NEGATIVE Final    Comment: (NOTE) If result is NEGATIVE SARS-CoV-2 target nucleic acids are NOT DETECTED. The SARS-CoV-2 RNA is generally detectable in upper and lower  respiratory specimens during the acute phase of infection. The lowest  concentration of SARS-CoV-2 viral copies this assay can detect is 250  copies / mL. A negative result does not preclude SARS-CoV-2 infection  and should not be used as the sole basis for treatment or other  patient management decisions.  A negative  result may occur with  improper specimen collection / handling, submission of specimen other  than nasopharyngeal swab, presence of viral mutation(s) within the  areas targeted by this assay, and inadequate number of viral copies  (<250 copies / mL). A negative result must be combined with clinical  observations, patient history, and epidemiological information. If result is POSITIVE SARS-CoV-2 target nucleic acids are DETECTED. The SARS-CoV-2 RNA is generally detectable in upper and lower  respiratory specimens dur ing the acute phase of infection.  Positive  results are indicative of active infection with SARS-CoV-2.  Clinical  correlation with patient history and other diagnostic information is  necessary to determine patient infection status.  Positive results do  not rule out bacterial infection or co-infection with other viruses. If result is PRESUMPTIVE POSTIVE SARS-CoV-2 nucleic acids MAY BE PRESENT.   A presumptive positive result was obtained on the submitted specimen  and confirmed on repeat testing.  While 2019 novel coronavirus  (SARS-CoV-2) nucleic acids may be present in the submitted sample  additional confirmatory testing may be necessary for epidemiological  and / or clinical management purposes  to differentiate between  SARS-CoV-2 and other Sarbecovirus currently known to infect humans.  If clinically indicated additional testing with an alternate test  methodology [id number]) is advised. The SARS-CoV-2 RNA is generally  detectable in upper and lower respiratory sp ecimens during the acute  phase of infection. The expected result is Negative. Fact Sheet for Patients:  StrictlyIdeas.no Fact Sheet for Healthcare Providers: BankingDealers.co.za This test is not yet approved or cleared by the [place] FDA and has been authorized for detection and/or diagnosis of SARS-CoV-2 by FDA under an Emergency Use Authorization  (EUA).  This EUA will remain in effect (meaning this test can be used) for the duration of the COVID-19 declaration under Section 564(b)(1) of the Act, 21 U.S.C. section 360bbb-3(b)(1), unless the authorization is terminated or revoked sooner. Performed at [facility], [place] [address], [place], [place] [address]   MRSA PCR Screening     Status: None   Collection Time: 01/27/19  9:03 PM   Specimen: Nasal Mucosa; Nasopharyngeal  Result Value Ref Range Status   MRSA by PCR NEGATIVE NEGATIVE Final    Comment:        The GeneXpert MRSA Assay (FDA approved for NASAL specimens only), is one component of a comprehensive MRSA colonization surveillance program. It is not intended to diagnose MRSA infection nor to guide or monitor treatment for MRSA infections. Performed at [facility], [place] [address] [place], [place] [address]          Radiology Studies: No results found.      Scheduled Meds: . levothyroxine  88 mcg Oral QAC breakfast  . metoprolol tartrate  50 mg Oral BID WC  .  sodium chloride flush  3 mL Intravenous Once  . Warfarin - Pharmacist Dosing Inpatient   Does not apply q1800   Continuous Infusions: . cefTRIAXone (ROCEPHIN)  IV 1 g (01/29/19 1421)  . dextrose 5 % and 0.45% NaCl 75 mL/hr at 01/27/19 2057     LOS: 1 day    Time spent: 37 minutes spent on chart review, personally reviewed all imaging studies and labs, discussion with nursing staff, consultants, updating family and interview/physical exam; more than 50% of that time was spent in counseling and/or coordination of care.    Eric J [place] DO [facility] Pager [phone]  If 7PM-7AM, please contact night-coverage [email or web address] Password [email or web address] 01/29/2019, 2:40 PM

## 2019-01-29 NOTE — TOC Transition Note (Addendum)
Transition of Care (TOC) - CM/SW Discharge Note *8/17 - No discharge - see 2:40 pm addendum to clinical narrative below.   Patient Details  Name: Charlotte Castro MRN: [id number] Date of Birth: 01/21/1933  Transition of Care [facility]) CM/SW Contact:  Sable Feil, LCSW Phone Number: 01/29/2019, 12:12 PM   Clinical Narrative:  CSW advised by MD of patient's readiness for discharge Facility admissions Bryson Ha informed and was provided with patient's room number at [facility]. Admissions liaison, Gayla Medicus retrieved d/c summary from [facility]. Daughter Paislie Tessler [phone]) contacted and informed of discharge.   2:40 pm - CSW informed by patient's nurse Kami that she will not discharge today due to vomiting. Tammy with [facility] and patient's daughter contacted and informed. CSW will continue to follow and facilitate discharge back to [facility] when medically stable.   Final next level of care: [place] Barriers to Discharge: No Barriers Identified   Patient Goals and CMS Choice Patient states their goals for this hospitalization and ongoing recovery are:: Patient agreeable to returning to [facility] to resume rehab and then return home CMS Medicare.gov Compare Post Acute Care list provided to:: Other (Comment Required)(n/a as patient returning to [place]) Choice offered to / list presented to : NA  Discharge Placement   Existing PASRR number confirmed : 01/28/19            Patient to be transferred to facility by: Ambulance Name of family member notified: Daughter Loany Neuroth by phone - [phone] Patient and family notified of of transfer: 01/29/19  Discharge Plan and Services In-house Referral: Clinical Social Work Discharge Planning Services: NA Post Acute Care Choice: [place]          DME Arranged: N/A DME Agency: NA       HH Arranged: NA HH Agency: NA        Social Determinants of Health (SDOH) Interventions  No  SDOH interventions needed    Readmission Risk Interventions No flowsheet data found.

## 2019-01-29 NOTE — Progress Notes (Addendum)
   01/29/19 1427  Provider Notification  Provider Name/Title Dr. E [place]  Date Provider Notified 01/29/19  Time Provider Notified [id number]  Notification Type Page  Notification Reason Other (Comment) (Nausea/ Vomiting)  Response See new orders  Date of Provider Response 01/29/19  Time of Provider Response 1429   Notification to SW/CM  to hold discharge for today. Dorthey Sawyer, RN

## 2019-01-29 NOTE — Progress Notes (Signed)
Patient feels notably better this evening and reports "that medicine you gave me really did the trick". No further nausea noted post phenergan administration. Will continue to monitor. Dorthey Sawyer, RN

## 2019-01-29 NOTE — Progress Notes (Signed)
Patient refused CPAP for the night  

## 2019-01-29 NOTE — Progress Notes (Signed)
Pt. Refused cpap. Pt. States she does not wear cpap.

## 2019-01-29 NOTE — Consult Note (Addendum)
   [facility] Inpatient Consult   01/29/2019  Charlotte Castro [birth year] [id number]   Patient reviewedifpotential [place] Management services are needed under her Medicare/ [employer] plan. Review of patient's medical record revealsthatpatienthas a 30 day readmission, 2 hospitalizations, 1 ED visit in the past 6 months, with a23% highrisk score for unplanned readmissions.  Medical record review and MDdischarge summary dated 01/29/19, reveal as follows: 85 year old female with past medical history of atrial fibrillation on Coumadin, diastolic heart failure and hypertension status post cholecystectomy in 2018 for recurrent gallstone pancreatitis brought in on 8/15 with 3 days of nausea and vomiting, abdominal pain. CT scan noted no evidence of bowel obstruction, but did note enlarged common bile duct with slightly elevated bilirubin Patient also found to have large UTI.   She presented with intractable nausea/ vomiting for 3 days at her SNF associated with abdominal discomfort. Unfortunately given her advanced age, she is not an optimal surgical candidate for intervention regarding her hiatal hernia, more conservative approach was recommended.  Primary Care provider isDr. Bergen with [facility] [place], listed to provide transition of care follow-up.  Review of transition of careteam notes,[facility], and agreeable of returning back to [facility] to resume rehab, prior to return home.   Pleaserefer to [facility] care managementifthere areany disposition changesforfollow-upas appropriate.  Of note, [facility] Care Management services does not replace or interfere with any services that are arranged by transition of care case management or social work.   For questions and additional information, please call:  Kemoni Ortega A. Shinika Estelle, BSN, RN-BC [facility] Liaison Cell:  [phone]

## 2019-01-29 NOTE — Discharge Summary (Signed)
Physician Discharge Summary  Charlotte Castro YHC:[id number] DOB: [birth year] DOA: 01/27/2019  PCP: Isaac Bliss, Rayford Halsted, MD  Admit date: 01/27/2019 Discharge date: 01/29/2019  Admitted From: [facility] Disposition:  [facility]  Recommendations for Outpatient Follow-up:  1. Follow up with PCP in 1 week 2. Please obtain INR on 01/30/2019, INR 3.5 at time of discharge 3. Continue to hold coumadin until INR downtrends (INR increase thought to be from receiving bactrim per pharmacy) 4. GI soft diet x 1 week  Home Health: No  Equipment/Devices: None  Discharge Condition: Stable CODE STATUS: Full code Diet recommendation: GI soft diet  History of present illness:  85 year old female with past medical history of atrial fibrillation on Coumadin, diastolic heart failure and hypertension status post cholecystectomy in 2018 for recurrent gallstone pancreatitis brought in on 8/15 with 3 days of nausea and vomiting abdominal pain.  CT scan noted no evidence of bowel obstruction, but did note enlarged common bile duct with slightly elevated bilirubin.  Patient brought in for further evaluation.  GI consulted and recommended that given patient's advanced age, more conservative management would be utilized.  Patient's diet slowly advanced, placed on antiemetics.  She has been so far tolerable of full liquids.  She received 1 meal so far of soft bland diet in which she ate only a small amount, but tolerated this somewhat.  Also found to have large UTI.  Hospital course:  Intractable nausea and vomiting Patient presenting with intractable nausea/vomiting previous 3 days at her SNF associated with abdominal discomfort.  CT abdomen/pelvis negative for bowel obstruction but notable for large common bile duct with slightly elevated bilirubin on presentation.  Gastroenterology was consulted and followed during hospital course.  Given her advanced age and more conservative approach was recommended.   Patient's diet was slowly advanced and tolerated with the use of antiemetics as needed.  She continues to tolerate soft diet without use of any further antiemetics.  Bilirubin improved.  GI believes that her presenting symptoms were possibly related to a mechanical obstruction related to her hiatal hernia versus a torsion or volvulus which spontaneously resolved.  Unfortunately given her advanced age, she is not an optimal surgical candidate for intervention regarding her hiatal hernia.  Recommend maintaining a soft diet for at least 1 week before further advancement to heart healthy/low-sodium diet.  Acute cystitis without hematuria Urinalysis with moderate leukoesterase, negative nitrite, many bacteria and greater than 50 WBCs.  Urine culture was still in process at time of discharge.  Patient received a 3-day course of IV ceftriaxone.  Paroxysmal atrial fibrillation Rate controlled.  Continue metoprolol tartrate 50 mg p.o. twice daily.  Patient on Coumadin outpatient for chronic anticoagulation.  INR at time of discharge elevated to 3.5 (goal 2.0-3.0).  Recommend continue to hold Coumadin, pharmacy believes patient received a dose of Bactrim which may be increasing INR.  Recommend follow-up outpatient with repeat INR on 01/30/2019; and recommend resumption of Coumadin once INR downtrending.  Hypothyroidism Continue Synthroid 88 mcg p.o. daily.  Obesity: BMI 29.77.  Discussed with her regarding needs for lifestyle changes and weight loss as this complicates all facets of care.  Discharge Diagnoses:  Active Problems:   Hiatal hernia    Discharge Instructions  Discharge Instructions    Call MD for:  difficulty breathing, headache or visual disturbances   Complete by: As directed    Call MD for:  extreme fatigue   Complete by: As directed    Call MD for:  persistant dizziness  or light-headedness   Complete by: As directed    Call MD for:  persistant nausea and vomiting   Complete by: As  directed    Call MD for:  severe uncontrolled pain   Complete by: As directed    Call MD for:  temperature >100.4   Complete by: As directed    Diet - low sodium heart healthy   Complete by: As directed    Soft diet for one week   Increase activity slowly   Complete by: As directed      Allergies as of 01/29/2019      Reactions   Iodinated Diagnostic Agents Shortness Of Breath, Other (See Comments)   "Allergic," per [facility]- Causes headaches, also   Zosyn [piperacillin Sod-tazobactam So] Other (See Comments)   "Allergic," per [facility]   Penicillins Other (See Comments)   Tolerated Zosyn Oct 2018, but "Allergic," per [facility] Did it involve swelling of the face/tongue/throat, SOB, or low BP? Unk Did it involve sudden or severe rash/hives, skin peeling, or any reaction on the inside of your mouth or nose? Unk Did you need to seek medical attention at a hospital or doctor's office? Unk When did it last happen? Unk If all above answers are "NO", may proceed with cephalosporin use.      Medication List    STOP taking these medications   naproxen sodium 220 MG tablet Commonly known as: ALEVE   QUEtiapine 25 MG tablet Commonly known as: SEROQUEL     TAKE these medications   acetaminophen 325 MG tablet Commonly known as: TYLENOL Take 650 mg by mouth daily.   vitamin B-12 1000 MCG tablet Commonly known as: CYANOCOBALAMIN Take 500 mcg by mouth 2 (two) times daily. What changed: Another medication with the same name was changed. Make sure you understand how and when to take each.   cyanocobalamin 1000 MCG/ML injection Commonly known as: (VITAMIN B-12) Inject 1 ml once a week for 1 month. Then 1 ml once a month thereafter. What changed:   how much to take  how to take this  when to take this  additional instructions   levothyroxine 88 MCG tablet Commonly known as: SYNTHROID Take 1 tablet (88 mcg total) by mouth daily before breakfast.   metoprolol tartrate 50 MG tablet Commonly  known as: LOPRESSOR TAKE ONE TABLET TWICE DAILY WITH A MEAL What changed: See the new instructions.   omeprazole 40 MG capsule Commonly known as: PRILOSEC Take 40 mg by mouth daily.   ondansetron 4 MG tablet Commonly known as: ZOFRAN Take 4 mg by mouth every 6 (six) hours as needed for nausea or vomiting (FOR 14 DAYS).   SYRINGE-NEEDLE (DISP) 3 ML 25G X 1-1/2" 3 ML Misc Use as needed for B12 injections   warfarin 5 MG tablet Commonly known as: COUMADIN Take as directed. If you are unsure how to take this medication, talk to your nurse or doctor. Original instructions: Take 1 tablet (5 mg total) by mouth daily at 6 PM. Hold until 8/18 Start taking on: January 30, 2019 What changed:   how much to take  how to take this  when to take this  additional instructions      Follow-up Information    Isaac Bliss, Rayford Halsted, MD. Schedule an appointment as soon as possible for a visit in 1 week(s).   Specialty: Internal Medicine Contact information: [place] [address] [phone]          Allergies  Allergen Reactions  . Iodinated Diagnostic Agents Shortness Of Breath and Other (See Comments)    "Allergic," per [facility]- Causes headaches, also  . Zosyn [Piperacillin Sod-Tazobactam So] Other (See Comments)    "Allergic," per [facility]  . Penicillins Other (See Comments)    Tolerated Zosyn Oct 2018, but "Allergic," per [facility] Did it involve swelling of the face/tongue/throat, SOB, or low BP? Unk Did it involve sudden or severe rash/hives, skin peeling, or any reaction on the inside of your mouth or nose? Unk Did you need to seek medical attention at a hospital or doctor's office? Unk When did it last happen? Unk If all above answers are "NO", may proceed with cephalosporin use.     Consultations:  Gastroenterology - Dr. Cristina Gong   Procedures/Studies: Ct Abdomen Pelvis Wo Contrast  Result Date: 01/27/2019 CLINICAL DATA:  Evaluate for bowel  obstruction. Pain after eating. Emesis yesterday. EXAM: CT ABDOMEN AND PELVIS WITHOUT CONTRAST TECHNIQUE: Multidetector CT imaging of the abdomen and pelvis was performed following the standard protocol without IV contrast. COMPARISON:  04/05/2017 FINDINGS: Lower chest: No acute abnormality. Moderate size hiatal hernia identified with greater than 50% intrathoracic stomach. Hepatobiliary: No liver abnormality. Previous cholecystectomy. Mild fusiform dilatation of the CBD measures up to 1.2 cm. Pancreas: Unremarkable. No pancreatic ductal dilatation or surrounding inflammatory changes. Spleen: Normal in size without focal abnormality. Adrenals/Urinary Tract: Normal appearance of the adrenal glands. No kidney mass, kidney stone or hydronephrosis identified. Urinary bladder appears normal. Stomach/Bowel: No evidence of bowel wall thickening, distension or inflammation. The appendix is visualized and appears normal. No pathologic dilatation of the colon. Vascular/Lymphatic: Aortic atherosclerosis. No aneurysm. No abdominal or pelvic adenopathy. No inguinal adenopathy. Reproductive: Status post hysterectomy. No adnexal masses. Other: No abdominal wall hernia or abnormality. No abdominopelvic ascites. Musculoskeletal: No acute or significant osseous findings. Chronic postoperative changes from interbody fusion L4-5 and L5-S1 with posterior decompression noted. IMPRESSION: 1. No evidence for bowel obstruction. 2. Large hiatal hernia 3.  Aortic Atherosclerosis (ICD10-I70.0). 4. Status post cholecystectomy with increase caliber of the CBD. Electronically Signed   By: Kerby Moors M.D.   On: 01/27/2019 10:46   Dg Chest 2 View  Result Date: 01/19/2019 CLINICAL DATA:  85 year old female with chest pain. EXAM: CHEST - 2 VIEW COMPARISON:  Chest radiograph dated 01/13/2019 FINDINGS: There is no focal consolidation, pleural effusion, or pneumothorax. Large hiatal hernia in noted. Stable cardiac silhouette. No acute osseous  pathology. IMPRESSION: 1. No acute cardiopulmonary process. 2. Large hiatal hernia. Electronically Signed   By: Anner Crete M.D.   On: 01/19/2019 23:31   Dg Chest 2 View  Result Date: 01/13/2019 CLINICAL DATA:  Shortness of breath and chest pain EXAM: CHEST - 2 VIEW COMPARISON:  June 10, 2017 chest radiograph and chest CT Oct 26, 2017 FINDINGS: There is no edema or consolidation. Heart size and pulmonary vascularity are normal. No adenopathy. There is a large paraesophageal hernia. No bone lesions. No pneumothorax. IMPRESSION: Large paraesophageal hernia. No edema or consolidation. Stable cardiac silhouette. Electronically Signed   By: Lowella Grip III M.D.   On: 01/13/2019 16:42   Dg Abd 1 View  Result Date: 01/14/2019 CLINICAL DATA:  Nausea and vomiting EXAM: ABDOMEN - 1 VIEW COMPARISON:  None FINDINGS: No dilated loops of large or small bowel. No pathologic calcifications. No organomegaly. No aggressive osseous lesion. No intraperitoneal free air. Lower lumbar fusion. Degenerative narrowing of the hip joints. IMPRESSION: No bowel obstruction. Electronically Signed   By: Helane Gunther.D.  On: 01/14/2019 14:37      Subjective: Patient seen and examined at bedside, resting comfortably.  Tolerating diet.  No further nausea or vomiting.  Ready for discharge back to SNF.  Denies headache, no fever/chills/night sweats, no nausea vomiting/diarrhea, no chest pain, palpitations, no abdominal pain, no weakness, no paresthesias.  No acute events overnight per nursing staff.   Discharge Exam: Vitals:   01/29/19 0344 01/29/19 0841  BP: 130/72 126/69  Pulse: 85 79  Resp: 16 20  Temp: 97.7 F (36.5 C) 97.7 F (36.5 C)  SpO2: 97% 100%   Vitals:   01/28/19 1628 01/28/19 1933 01/29/19 0344 01/29/19 0841  BP: 114/64 121/79 130/72 126/69  Pulse: 78 76 85 79  Resp: 18 20 16 20   Temp: 98.3 F (36.8 C) 98.2 F (36.8 C) 97.7 F (36.5 C) 97.7 F (36.5 C)  TempSrc: Oral Oral Oral Oral   SpO2: 98% 98% 97% 100%  Weight:      Height:        General: Pt is alert, awake, not in acute distress Cardiovascular: RRR, S1/S2 +, no rubs, no gallops Respiratory: CTA bilaterally, no wheezing, no rhonchi Abdominal: Soft, NT, ND, bowel sounds + Extremities: no edema, no cyanosis    The results of significant diagnostics from this hospitalization (including imaging, microbiology, ancillary and laboratory) are listed below for reference.     Microbiology: Recent Results (from the past 240 hour(s))  SARS Coronavirus 2 [facility] order, Performed in [facility] hospital lab) Nasopharyngeal Nasopharyngeal Swab     Status: None   Collection Time: 01/27/19  1:26 PM   Specimen: Nasopharyngeal Swab  Result Value Ref Range Status   SARS Coronavirus 2 NEGATIVE NEGATIVE Final    Comment: (NOTE) If result is NEGATIVE SARS-CoV-2 target nucleic acids are NOT DETECTED. The SARS-CoV-2 RNA is generally detectable in upper and lower  respiratory specimens during the acute phase of infection. The lowest  concentration of SARS-CoV-2 viral copies this assay can detect is 250  copies / mL. A negative result does not preclude SARS-CoV-2 infection  and should not be used as the sole basis for treatment or other  patient management decisions.  A negative result may occur with  improper specimen collection / handling, submission of specimen other  than nasopharyngeal swab, presence of viral mutation(s) within the  areas targeted by this assay, and inadequate number of viral copies  (<250 copies / mL). A negative result must be combined with clinical  observations, patient history, and epidemiological information. If result is POSITIVE SARS-CoV-2 target nucleic acids are DETECTED. The SARS-CoV-2 RNA is generally detectable in upper and lower  respiratory specimens dur ing the acute phase of infection.  Positive  results are indicative of active infection with SARS-CoV-2.  Clinical  correlation with  patient history and other diagnostic information is  necessary to determine patient infection status.  Positive results do  not rule out bacterial infection or co-infection with other viruses. If result is PRESUMPTIVE POSTIVE SARS-CoV-2 nucleic acids MAY BE PRESENT.   A presumptive positive result was obtained on the submitted specimen  and confirmed on repeat testing.  While 2019 novel coronavirus  (SARS-CoV-2) nucleic acids may be present in the submitted sample  additional confirmatory testing may be necessary for epidemiological  and / or clinical management purposes  to differentiate between  SARS-CoV-2 and other Sarbecovirus currently known to infect humans.  If clinically indicated additional testing with an alternate test  methodology [phone]) is advised. The SARS-CoV-2 RNA  is generally  detectable in upper and lower respiratory sp ecimens during the acute  phase of infection. The expected result is Negative. Fact Sheet for Patients:  StrictlyIdeas.no Fact Sheet for Healthcare Providers: BankingDealers.co.za This test is not yet approved or cleared by the [place] FDA and has been authorized for detection and/or diagnosis of SARS-CoV-2 by FDA under an Emergency Use Authorization (EUA).  This EUA will remain in effect (meaning this test can be used) for the duration of the COVID-19 declaration under Section 564(b)(1) of the Act, 21 U.S.C. section 360bbb-3(b)(1), unless the authorization is terminated or revoked sooner. Performed at [facility], [place] [address], [place], [place] [address]   MRSA PCR Screening     Status: None   Collection Time: 01/27/19  9:03 PM   Specimen: Nasal Mucosa; Nasopharyngeal  Result Value Ref Range Status   MRSA by PCR NEGATIVE NEGATIVE Final    Comment:        The GeneXpert MRSA Assay (FDA approved for NASAL specimens only), is one component of a comprehensive MRSA  colonization surveillance program. It is not intended to diagnose MRSA infection nor to guide or monitor treatment for MRSA infections. Performed at [facility], [place] [address], [place], [place] [address]      Labs: BNP (last 3 results) No results for input(s): BNP in the last 8760 hours. Basic Metabolic Panel: Recent Labs  Lab 01/27/19 0854 01/28/19 0621  NA 141 137  K 4.4 3.5  CL 104 105  CO2 25 21*  GLUCOSE 112* 113*  BUN 20 20  CREATININE 1.15* 1.33*  CALCIUM 10.0 8.5*   Liver Function Tests: Recent Labs  Lab 01/27/19 0854 01/28/19 0621  AST 23 16  ALT 15 13  ALKPHOS 61 55  BILITOT 1.4* 0.9  PROT 6.7 5.6*  ALBUMIN 3.6 3.0*   Recent Labs  Lab 01/27/19 0854  LIPASE 37   No results for input(s): AMMONIA in the last 168 hours. CBC: Recent Labs  Lab 01/27/19 0854  WBC 8.9  HGB 14.9  HCT 42.2  MCV 93.6  PLT 227   Cardiac Enzymes: No results for input(s): CKTOTAL, CKMB, CKMBINDEX, TROPONINI in the last 168 hours. BNP: Invalid input(s): POCBNP CBG: No results for input(s): GLUCAP in the last 168 hours. D-Dimer No results for input(s): DDIMER in the last 72 hours. Hgb A1c No results for input(s): HGBA1C in the last 72 hours. Lipid Profile No results for input(s): CHOL, HDL, LDLCALC, TRIG, CHOLHDL, LDLDIRECT in the last 72 hours. Thyroid function studies No results for input(s): TSH, T4TOTAL, T3FREE, THYROIDAB in the last 72 hours.  Invalid input(s): FREET3 Anemia work up No results for input(s): VITAMINB12, FOLATE, FERRITIN, TIBC, IRON, RETICCTPCT in the last 72 hours. Urinalysis    Component Value Date/Time   COLORURINE AMBER (A) 01/27/2019 0930   APPEARANCEUR TURBID (A) 01/27/2019 0930   LABSPEC 1.021 01/27/2019 0930   PHURINE 6.0 01/27/2019 0930   GLUCOSEU NEGATIVE 01/27/2019 0930   HGBUR SMALL (A) 01/27/2019 0930   BILIRUBINUR NEGATIVE 01/27/2019 0930   KETONESUR NEGATIVE 01/27/2019 0930   PROTEINUR 100 (A) 01/27/2019 0930    UROBILINOGEN 2.0 (H) 11/18/2010 0019   NITRITE NEGATIVE 01/27/2019 0930   LEUKOCYTESUR MODERATE (A) 01/27/2019 0930   Sepsis Labs Invalid input(s): PROCALCITONIN,  WBC,  LACTICIDVEN Microbiology Recent Results (from the past 240 hour(s))  SARS Coronavirus 2 [facility] order, Performed in [facility] hospital lab) Nasopharyngeal Nasopharyngeal Swab     Status: None  Collection Time: 01/27/19  1:26 PM   Specimen: Nasopharyngeal Swab  Result Value Ref Range Status   SARS Coronavirus 2 NEGATIVE NEGATIVE Final    Comment: (NOTE) If result is NEGATIVE SARS-CoV-2 target nucleic acids are NOT DETECTED. The SARS-CoV-2 RNA is generally detectable in upper and lower  respiratory specimens during the acute phase of infection. The lowest  concentration of SARS-CoV-2 viral copies this assay can detect is 250  copies / mL. A negative result does not preclude SARS-CoV-2 infection  and should not be used as the sole basis for treatment or other  patient management decisions.  A negative result may occur with  improper specimen collection / handling, submission of specimen other  than nasopharyngeal swab, presence of viral mutation(s) within the  areas targeted by this assay, and inadequate number of viral copies  (<250 copies / mL). A negative result must be combined with clinical  observations, patient history, and epidemiological information. If result is POSITIVE SARS-CoV-2 target nucleic acids are DETECTED. The SARS-CoV-2 RNA is generally detectable in upper and lower  respiratory specimens dur ing the acute phase of infection.  Positive  results are indicative of active infection with SARS-CoV-2.  Clinical  correlation with patient history and other diagnostic information is  necessary to determine patient infection status.  Positive results do  not rule out bacterial infection or co-infection with other viruses. If result is PRESUMPTIVE POSTIVE SARS-CoV-2 nucleic acids MAY BE PRESENT.   A  presumptive positive result was obtained on the submitted specimen  and confirmed on repeat testing.  While 2019 novel coronavirus  (SARS-CoV-2) nucleic acids may be present in the submitted sample  additional confirmatory testing may be necessary for epidemiological  and / or clinical management purposes  to differentiate between  SARS-CoV-2 and other Sarbecovirus currently known to infect humans.  If clinically indicated additional testing with an alternate test  methodology [phone]) is advised. The SARS-CoV-2 RNA is generally  detectable in upper and lower respiratory sp ecimens during the acute  phase of infection. The expected result is Negative. Fact Sheet for Patients:  StrictlyIdeas.no Fact Sheet for Healthcare Providers: BankingDealers.co.za This test is not yet approved or cleared by the [place] FDA and has been authorized for detection and/or diagnosis of SARS-CoV-2 by FDA under an Emergency Use Authorization (EUA).  This EUA will remain in effect (meaning this test can be used) for the duration of the COVID-19 declaration under Section 564(b)(1) of the Act, 21 U.S.C. section 360bbb-3(b)(1), unless the authorization is terminated or revoked sooner. Performed at [facility], [place] [address], [place], [place] [address]   MRSA PCR Screening     Status: None   Collection Time: 01/27/19  9:03 PM   Specimen: Nasal Mucosa; Nasopharyngeal  Result Value Ref Range Status   MRSA by PCR NEGATIVE NEGATIVE Final    Comment:        The GeneXpert MRSA Assay (FDA approved for NASAL specimens only), is one component of a comprehensive MRSA colonization surveillance program. It is not intended to diagnose MRSA infection nor to guide or monitor treatment for MRSA infections. Performed at [facility], [place] [address], [place], [place] [address]      Time coordinating discharge: Over 30 minutes  SIGNED:   Eric J  [place] DO  [facility] 01/29/2019, 11:11 AM

## 2019-01-30 ENCOUNTER — Encounter (HOSPITAL_COMMUNITY): Payer: Self-pay

## 2019-01-30 ENCOUNTER — Inpatient Hospital Stay (HOSPITAL_COMMUNITY): Payer: Medicare Other

## 2019-01-30 LAB — COMPREHENSIVE METABOLIC PANEL
ALT: 17 U/L (ref 0–44)
AST: 20 U/L (ref 15–41)
Albumin: 2.8 g/dL — ABNORMAL LOW (ref 3.5–5.0)
Alkaline Phosphatase: 55 U/L (ref 38–126)
Anion gap: 7 (ref 5–15)
BUN: 10 mg/dL (ref 8–23)
CO2: 24 mmol/L (ref 22–32)
Calcium: 8.9 mg/dL (ref 8.9–10.3)
Chloride: 112 mmol/L — ABNORMAL HIGH (ref 98–111)
Creatinine, Ser: 0.94 mg/dL (ref 0.44–1.00)
GFR calc Af Amer: >60 mL/min (ref >60)
GFR calc non Af Amer: 56 mL/min — ABNORMAL LOW (ref >60)
Glucose, Bld: 100 mg/dL — ABNORMAL HIGH (ref 70–99)
Potassium: 3.7 mmol/L (ref 3.5–5.1)
Sodium: 143 mmol/L (ref 135–145)
Total Bilirubin: 0.9 mg/dL (ref 0.3–1.2)
Total Protein: 5.4 g/dL — ABNORMAL LOW (ref 6.5–8.1)

## 2019-01-30 LAB — URINE CULTURE: Culture: >=100000 — AB

## 2019-01-30 LAB — CBC
HCT: 37.3 % (ref 36.0–46.0)
Hemoglobin: 13 g/dL (ref 12.0–15.0)
MCH: 32.7 pg (ref 26.0–34.0)
MCHC: 34.9 g/dL (ref 30.0–36.0)
MCV: 94 fL (ref 80.0–100.0)
Platelets: 175 10*3/uL (ref 150–400)
RBC: 3.97 MIL/uL (ref 3.87–5.11)
RDW: 13.7 % (ref 11.5–15.5)
WBC: 5.7 10*3/uL (ref 4.0–10.5)
nRBC: 0 % (ref 0.0–0.2)

## 2019-01-30 LAB — PROTIME-INR
INR: 3 — ABNORMAL HIGH (ref 0.8–1.2)
Prothrombin Time: 30.9 seconds — ABNORMAL HIGH (ref 11.4–15.2)

## 2019-01-30 LAB — MAGNESIUM: Magnesium: 1.8 mg/dL (ref 1.7–2.4)

## 2019-01-30 MED ORDER — WARFARIN SODIUM 2.5 MG PO TABS
2.5000 mg | ORAL_TABLET | Freq: Once | ORAL | Status: AC
  Administered 2019-01-30: 2.5 mg via ORAL
  Filled 2019-01-30: qty 1

## 2019-01-30 MED ORDER — METOCLOPRAMIDE HCL 5 MG PO TABS
5.0000 mg | ORAL_TABLET | Freq: Three times a day (TID) | ORAL | Status: DC
  Administered 2019-01-30 – 2019-01-31 (×6): 5 mg via ORAL
  Filled 2019-01-30 (×6): qty 1

## 2019-01-30 NOTE — Progress Notes (Signed)
ANTICOAGULATION CONSULT NOTE - Follow-Up Consult  Pharmacy Consult for warfarin Indication: atrial fibrillation  Vital Signs: Temp: 98.1 F (36.7 C) (08/18 0953) Temp Source: Oral (08/18 0953) BP: 143/66 (08/18 0953) Pulse Rate: 76 (08/18 0953)  Labs: Recent Labs    01/28/19 0621 01/29/19 0655 01/30/19 0545  HGB  --   --  13.0  HCT  --   --  37.3  PLT  --   --  175  LABPROT 29.5* 34.9* 30.9*  INR 2.9* 3.5* 3.0*  CREATININE 1.33*  --  0.94    Estimated Creatinine Clearance: 52 mL/min (by C-G formula based on SCr of 0.94 mg/dL).   Assessment: 79 yoF presents to ED with abdominal pain and N/V. The patient was on warfarin PTA for hx Afib - pharmacy consulted to resume dosing this admission.  PTA warfarin regimen: 5mg  daily (per SNF [facility]) however last [facility] clinic note (11/30/18) stated 5 mg daily EXCEPT for 2.5 mg on Sundays only.   INR today has trended down into the therapeutic range (INR 3 << 3.5, goal of 2-3). Jump up in INR earlier this admission likely related to Bactrim dose given on 8/15 in addition to poor po intake with N/V. CBC wnl, no bleeding noted, po intake variable. Will resume with a low dose this evening and watch INR trends closely.  Goal of Therapy:  INR 2-3 Monitor platelets by anticoagulation protocol: Yes   Plan:  - Warfarin 2.5 mg x 1 dose at 1800 today (if still here) - - Daily PT/INR, CBC q72h - Will continue to monitor for any signs/symptoms of bleeding and will follow up with PT/INR in the a.m (if still here)  Thank you for allowing pharmacy to be a part of this patient's care.  Alycia Rossetti, PharmD, BCPS Clinical Pharmacist Clinical phone for 01/30/2019: [phone] 01/30/2019 10:55 AM   **Pharmacist phone directory can now be found on [email or web address] ([email or web address]).  Listed under [place].

## 2019-01-30 NOTE — Progress Notes (Signed)
PROGRESS NOTE    Charlotte Castro  BSW:[id number] DOB: 12/06/1932 DOA: 01/27/2019 PCP: Isaac Bliss, Rayford Halsted, MD    Brief Narrative:   Charlotte Castro  is a 85 y.o. female, resident of an assisted living facility with past medical history significant for A. fib, on Coumadin, congestive heart failure, hypertension and status post cholecystectomy in 2018 for recurrent gallstone pancreatitis , presenting with 3 days history of nausea vomiting with abdominal pain.  Her abdominal pain is epigastric/umbilical region and worse with meals.  Her bowel movements are normal.  No chest pains shortness of breath, urinary symptoms, fever or chills.    ER work-up showed slightly elevated bilirubin, normal CBC CT of the abdomen which showed no evidence of bowel obstruction but enlarged common bile duct. Discussed with Dr. Cristina Gong from GI who advised admission for observation and advancing diet slowly.  Further work-up will be needed if patient cannot tolerate advancing diet.  Her nurse told me that she has episodes of anxiety and she would like antianxiety medications so we will start the patient on Klonopin as needed.  Assessment & Plan:   Active Problems:   Hiatal hernia  Intractable nausea and vomiting Patient presenting from her SNF with intractable nausea and vomiting, with previous history of gallstone pancreatitis status post cholecystectomy.  On admission, her bilirubin was noted to be slightly elevated with a dilation of her common bile duct.  Gastroenterology was consulted and followed during the hospital course.  They believe that her food intolerance symptoms were likely related to a mechanical obstruction from her large hiatal hernia versus torsion/volvulus which seemed to have spontaneous resolve.  She had been tolerating her diet, but on the afternoon  Of 8/17 her nausea and vomiting unfortunately returned.  Unfortunately, she is a moderately high surgical risk given her advanced age per  GI. --D/C held on 8/17 for recurrent nausea --KUB 8/18 with normal bowel gas pattern --Repeat CMP today with normal total bilirubin --continue soft diet --Continue Zofran, added Phenergan prn on 8/17 with some improvement --will give trial of scheduled reglan today TIDAC/HS; reports most symptoms in evening --supportive care, hopefully for dc back to ALF tomorrow; may need smaller meals at night given her symptoms seem to recur only in the evening  Acute cystitis without hematuria Klebsiella pneumonia UTI --Completed 3-day course of IV ceftriaxone on 01/29/2019  Paroxysmal atrial fibrillation Currently rate controlled, continue metoprolol tartrate 50 mg p.o. twice daily. --Continue Coumadin (goal INR 2.0-3.0 for pAFIB), INR 3.0 today --Pharmacy consulted for assistance with dosing/monitoring Coumadin --INR rise thought to be related to medication side effect with Bactrim per pharmacy --Repeat INR in the a.m.  Hypothyroidism Continue Synthroid 88 mcg p.o. daily.  Obesity: BMI 29.77.  Discussed with her regarding needs for lifestyle changes and weight loss as this complicates all facets of care.  DVT prophylaxis: Coumadin Code Status: Full code Family Communication: None Disposition Plan: Anticipate discharge back to [facility] hopefully on 01/31/2019   Consultants:   GI - Dr. Cristina Gong - signed off 8/16  Procedures:   none  Antimicrobials:   Ceftriaxone 8/15 - 8/17   Subjective: Patient seen and examined at bedside this morning, lying comfortably in bed.  States return of nausea and vomiting yesterday late afternoon/evening with improvement with Phenergan.  States most of her symptoms return in the evening.  Repeat KUB this morning unrevealing with normal CMP.  Discussed with patient will give trial of Reglan scheduled with meals to see if this helps alleviate  some of her symptoms. Patient with no other complaints at this time.  Denies headache, no fever/chills/night  sweats, no diarrhea, no chest pain, no palpitations, no shortness of breath, no abdominal pain, no weakness, no paresthesia.  No other acute events/concerns overnight per nursing staff.  Objective: Vitals:   01/29/19 1623 01/29/19 2038 01/30/19 0526 01/30/19 0953  BP: (!) 148/76 (!) 92/58 (!) 148/78 (!) 143/66  Pulse: 87 71 72 76  Resp: 18 16 18 20   Temp: 98.1 F (36.7 C) (!) 97.5 F (36.4 C) 97.8 F (36.6 C) 98.1 F (36.7 C)  TempSrc: Oral Oral Oral Oral  SpO2: 100% 100% 100% 91%  Weight:      Height:        Intake/Output Summary (Last 24 hours) at 01/30/2019 1130 Last data filed at 01/30/2019 0900 Gross per 24 hour  Intake 2486.99 ml  Output 75 ml  Net 2411.99 ml   Filed Weights   01/27/19 2049 01/28/19 1001  Weight: 88.8 kg 88.8 kg    Examination:  General exam: Appears calm and comfortable  Respiratory system: Clear to auscultation. Respiratory effort normal. Cardiovascular system: S1 & S2 heard, RRR. No JVD, murmurs, rubs, gallops or clicks. No pedal edema. Gastrointestinal system: Abdomen is nondistended, soft and nontender. No organomegaly or masses felt. Normal bowel sounds heard. Central nervous system: Alert and oriented. No focal neurological deficits. Extremities: Symmetric 5 x 5 power. Skin: No rashes, lesions or ulcers Psychiatry: Judgement and insight appear normal. Mood & affect appropriate.     Data Reviewed: I have personally reviewed following labs and imaging studies  CBC: Recent Labs  Lab 01/27/19 0854 01/30/19 0545  WBC 8.9 5.7  HGB 14.9 13.0  HCT 42.2 37.3  MCV 93.6 94.0  PLT 227 659   Basic Metabolic Panel: Recent Labs  Lab 01/27/19 0854 01/28/19 0621 01/30/19 0545  NA 141 137 143  K 4.4 3.5 3.7  CL 104 105 112*  CO2 25 21* 24  GLUCOSE 112* 113* 100*  BUN 20 20 10   CREATININE 1.15* 1.33* 0.94  CALCIUM 10.0 8.5* 8.9  MG  --   --  1.8   GFR: Estimated Creatinine Clearance: 52 mL/min (by C-G formula based on SCr of 0.94  mg/dL). Liver Function Tests: Recent Labs  Lab 01/27/19 0854 01/28/19 0621 01/30/19 0545  AST 23 16 20   ALT 15 13 17   ALKPHOS 61 55 55  BILITOT 1.4* 0.9 0.9  PROT 6.7 5.6* 5.4*  ALBUMIN 3.6 3.0* 2.8*   Recent Labs  Lab 01/27/19 0854  LIPASE 37   No results for input(s): AMMONIA in the last 168 hours. Coagulation Profile: Recent Labs  Lab 01/27/19 0941 01/28/19 0621 01/29/19 0655 01/30/19 0545  INR 2.3* 2.9* 3.5* 3.0*   Cardiac Enzymes: No results for input(s): CKTOTAL, CKMB, CKMBINDEX, TROPONINI in the last 168 hours. BNP (last 3 results) No results for input(s): PROBNP in the last 8760 hours. HbA1C: No results for input(s): HGBA1C in the last 72 hours. CBG: Recent Labs  Lab 01/29/19 1629  GLUCAP 148*   Lipid Profile: No results for input(s): CHOL, HDL, LDLCALC, TRIG, CHOLHDL, LDLDIRECT in the last 72 hours. Thyroid Function Tests: No results for input(s): TSH, T4TOTAL, FREET4, T3FREE, THYROIDAB in the last 72 hours. Anemia Panel: No results for input(s): VITAMINB12, FOLATE, FERRITIN, TIBC, IRON, RETICCTPCT in the last 72 hours. Sepsis Labs: No results for input(s): PROCALCITON, LATICACIDVEN in the last 168 hours.  Recent Results (from the past 240 hour(s))  Urine  culture     Status: Abnormal   Collection Time: 01/27/19 10:42 AM   Specimen: Urine, Random  Result Value Ref Range Status   Specimen Description URINE, RANDOM  Final   Special Requests   Final    NONE Performed at [facility], [place] [address], [place], [place] [address]    Culture >=100,000 COLONIES/mL KLEBSIELLA PNEUMONIAE (A)  Final   Report Status 01/30/2019 FINAL  Final   Organism ID, Bacteria KLEBSIELLA PNEUMONIAE (A)  Final      Susceptibility   Klebsiella pneumoniae - MIC*    AMPICILLIN >=32 RESISTANT Resistant     CEFAZOLIN <=4 SENSITIVE Sensitive     CEFTRIAXONE <=1 SENSITIVE Sensitive     CIPROFLOXACIN <=0.25 SENSITIVE Sensitive     GENTAMICIN <=1 SENSITIVE Sensitive      IMIPENEM <=0.25 SENSITIVE Sensitive     NITROFURANTOIN 64 INTERMEDIATE Intermediate     TRIMETH/SULFA <=20 SENSITIVE Sensitive     AMPICILLIN/SULBACTAM 4 SENSITIVE Sensitive     PIP/TAZO <=4 SENSITIVE Sensitive     Extended ESBL NEGATIVE Sensitive     * >=100,000 COLONIES/mL KLEBSIELLA PNEUMONIAE  SARS Coronavirus 2 [facility] order, Performed in [facility] hospital lab) Nasopharyngeal Nasopharyngeal Swab     Status: None   Collection Time: 01/27/19  1:26 PM   Specimen: Nasopharyngeal Swab  Result Value Ref Range Status   SARS Coronavirus 2 NEGATIVE NEGATIVE Final    Comment: (NOTE) If result is NEGATIVE SARS-CoV-2 target nucleic acids are NOT DETECTED. The SARS-CoV-2 RNA is generally detectable in upper and lower  respiratory specimens during the acute phase of infection. The lowest  concentration of SARS-CoV-2 viral copies this assay can detect is 250  copies / mL. A negative result does not preclude SARS-CoV-2 infection  and should not be used as the sole basis for treatment or other  patient management decisions.  A negative result may occur with  improper specimen collection / handling, submission of specimen other  than nasopharyngeal swab, presence of viral mutation(s) within the  areas targeted by this assay, and inadequate number of viral copies  (<250 copies / mL). A negative result must be combined with clinical  observations, patient history, and epidemiological information. If result is POSITIVE SARS-CoV-2 target nucleic acids are DETECTED. The SARS-CoV-2 RNA is generally detectable in upper and lower  respiratory specimens dur ing the acute phase of infection.  Positive  results are indicative of active infection with SARS-CoV-2.  Clinical  correlation with patient history and other diagnostic information is  necessary to determine patient infection status.  Positive results do  not rule out bacterial infection or co-infection with other viruses. If result is  PRESUMPTIVE POSTIVE SARS-CoV-2 nucleic acids MAY BE PRESENT.   A presumptive positive result was obtained on the submitted specimen  and confirmed on repeat testing.  While 2019 novel coronavirus  (SARS-CoV-2) nucleic acids may be present in the submitted sample  additional confirmatory testing may be necessary for epidemiological  and / or clinical management purposes  to differentiate between  SARS-CoV-2 and other Sarbecovirus currently known to infect humans.  If clinically indicated additional testing with an alternate test  methodology [phone]) is advised. The SARS-CoV-2 RNA is generally  detectable in upper and lower respiratory sp ecimens during the acute  phase of infection. The expected result is Negative. Fact Sheet for Patients:  StrictlyIdeas.no Fact Sheet for Healthcare Providers: BankingDealers.co.za This test is not yet approved or cleared by the [place] FDA and has  been authorized for detection and/or diagnosis of SARS-CoV-2 by FDA under an Emergency Use Authorization (EUA).  This EUA will remain in effect (meaning this test can be used) for the duration of the COVID-19 declaration under Section 564(b)(1) of the Act, 21 U.S.C. section 360bbb-3(b)(1), unless the authorization is terminated or revoked sooner. Performed at [facility], [place] [address] [place], [place] [address]   MRSA PCR Screening     Status: None   Collection Time: 01/27/19  9:03 PM   Specimen: Nasal Mucosa; Nasopharyngeal  Result Value Ref Range Status   MRSA by PCR NEGATIVE NEGATIVE Final    Comment:        The GeneXpert MRSA Assay (FDA approved for NASAL specimens only), is one component of a comprehensive MRSA colonization surveillance program. It is not intended to diagnose MRSA infection nor to guide or monitor treatment for MRSA infections. Performed at [facility], [place] [address], [place], [place] [address]           Radiology Studies: Dg Abd 1 View  Result Date: 01/30/2019 CLINICAL DATA:  Abdominal pain EXAM: ABDOMEN - 1 VIEW COMPARISON:  01/14/2019 FINDINGS: Osseous demineralization with prior Ray cage fusion of L4-L5 and L5-S1. Surgical clips RIGHT upper quadrant question cholecystectomy. Normal bowel gas pattern without bowel dilatation or bowel wall thickening. No definite urinary tract calcification. IMPRESSION: Normal bowel gas pattern. Electronically Signed   By: Lavonia Dana M.D.   On: 01/30/2019 08:16        Scheduled Meds:  levothyroxine  88 mcg Oral QAC breakfast   metoCLOPramide  5 mg Oral TID AC & HS   metoprolol tartrate  50 mg Oral BID WC   sodium chloride flush  3 mL Intravenous Once   warfarin  2.5 mg Oral ONCE-1800   Warfarin - Pharmacist Dosing Inpatient   Does not apply q1800   Continuous Infusions:  dextrose 5 % and 0.45% NaCl 75 mL/hr at 01/30/19 0351     LOS: 2 days    Time spent: 29 minutes spent on chart review, personally reviewed all imaging studies and labs, discussion with nursing staff, consultants, updating family and interview/physical exam; more than 50% of that time was spent in counseling and/or coordination of care.    Markeese Boyajian J [place] DO [facility] Pager [phone]  If 7PM-7AM, please contact night-coverage [email or web address] Password [email or web address] 01/30/2019, 11:30 AM

## 2019-01-31 DIAGNOSIS — I4821 Permanent atrial fibrillation: Secondary | ICD-10-CM

## 2019-01-31 DIAGNOSIS — N3000 Acute cystitis without hematuria: Secondary | ICD-10-CM

## 2019-01-31 DIAGNOSIS — R109 Unspecified abdominal pain: Secondary | ICD-10-CM

## 2019-01-31 LAB — PROTIME-INR
INR: 2.6 — ABNORMAL HIGH (ref 0.8–1.2)
Prothrombin Time: 27.5 seconds — ABNORMAL HIGH (ref 11.4–15.2)

## 2019-01-31 LAB — SARS CORONAVIRUS 2 BY RT PCR (HOSPITAL ORDER, PERFORMED IN ~~LOC~~ HOSPITAL LAB): SARS Coronavirus 2: NEGATIVE

## 2019-01-31 MED ORDER — WARFARIN SODIUM 2.5 MG PO TABS
2.5000 mg | ORAL_TABLET | Freq: Once | ORAL | Status: AC
  Administered 2019-01-31: 2.5 mg via ORAL
  Filled 2019-01-31: qty 1

## 2019-01-31 MED ORDER — METOCLOPRAMIDE HCL 5 MG PO TABS: 5.0000 mg | ORAL_TABLET | Freq: Three times a day (TID) | ORAL | Status: DC

## 2019-01-31 NOTE — TOC Transition Note (Addendum)
Transition of Care [facility]) - CM/SW Discharge Note 01/31/19 - Discharged to [facility] via ambulance   Patient Details  Name: Charlotte Castro MRN: [id number] Date of Birth: 10-31-1932  Transition of Care [facility]) CM/SW Contact:  Sable Feil, LCSW Phone Number: 01/31/2019, 4:29 PM   Clinical Narrative:  Patient medically stable for discharge today and facility informed. Discharge clinicals transmitted to facility and transportation arranged via non-emergency transport (PTAR). Call made to daughter Jaleigh Mccroskey ([phone]) and HIPPA complaint message left regarding discharge. 5:16 pm - received call from Ms. Bilton advising CSW that she received my message regarding her mother's discharge.   Final next level of care: [place] Barriers to Discharge: No Barriers Identified   Patient Goals and CMS Choice Patient states their goals for this hospitalization and ongoing recovery are:: Patient expressed agreement in returning to [place] to continue her rehab CMS Medicare.gov Compare Post Acute Care list provided to:: Other (Comment Required)(n/a as patient from [place] and is returning to this SNF) Choice offered to / list presented to : NA  Discharge Placement   Existing PASRR number confirmed : 01/29/19          Patient [facility] bed at: [facility]) Patient to be transferred to facility by: Ambulance Name of family member notified: Shyanne Mcclary, daughter by phone - [phone] Patient and family notified of of transfer: 01/31/19  Discharge Plan and Services In-house Referral: Clinical Social Work Discharge Planning Services: NA Post Acute Care Choice: [place]          DME Arranged: N/A DME Agency: NA       HH Arranged: NA HH Agency: NA        Social Determinants of Health (SDOH) Interventions  No SDOH interventions needed at discharge.   Readmission Risk Interventions No flowsheet data  found.

## 2019-01-31 NOTE — Discharge Summary (Signed)
Physician Discharge Summary  Charlotte Castro XTG:[id number] DOB: May 25, 1933 DOA: 01/27/2019  PCP: Charlotte Castro, Charlotte Halsted, MD  Admit date: 01/27/2019 Discharge date: 01/31/2019  Time spent: 45 minutes  Recommendations for Outpatient Follow-up:  Patient will be discharged to [facility].  Patient will need to follow up with primary care provider within one week of discharge. Repeat INR.  Patient should continue medications as prescribed.  Patient should follow a soft diet.   Discharge Diagnoses:  Intractable nausea and vomiting Acute cystitis without hematuria, Klebsiella UTI Paroxysmal atrial fibrillation Hypothyroidism Obesity  Discharge Condition: Stable  Diet recommendation: soft   Filed Weights   01/27/19 2049 01/28/19 1001 01/30/19 2123  Weight: 88.8 kg 88.8 kg 88.5 kg    History of present illness:  On 01/27/2019 by Dr. Merton Border Charlotte Castro  is a 85 y.o. female, resident of an assisted living facility with past medical history significant for A. fib, on Coumadin, congestive heart failure, hypertension and status post cholecystectomy in 2018 for recurrent gallstone pancreatitis , presenting with 3 days history of nausea vomiting with abdominal pain.  Her abdominal pain is epigastric/umbilical region and worse with meals.  Her bowel movements are normal.  No chest pains shortness of breath, urinary symptoms, fever or chills.   ER work-up showed slightly elevated bilirubin, normal CBC CT of the abdomen which showed no evidence of bowel obstruction but enlarged common bile duct. Discussed with Dr. Cristina Castro from GI who advised admission for observation and advancing diet slowly.  Further work-up will be needed if patient cannot tolerate advancing diet. Her nurse told me that she has episodes of anxiety and she would like antianxiety medications so we will start the patient on Klonopin as needed.  Hospital Course:  Intractable nausea and vomiting -improved -Patient presented  with intractable nausea and vomiting with a history of gallstone pancreatitis status post cholecystectomy.  On admission, her bilirubin was slightly elevated with a dilatation of her common bile duct. -Gastroenterology consulted and appreciated felt that patient has food intolerance symptoms which are likely related to mechanical obstruction from her large hiatal hernia versus torsion/volvulus which seemed to have spontaneously resolved -Patient currently on a soft diet and tolerating well -She was placed on antiemetics as needed as well as IV fluid hydration as well as Reglan  Acute cystitis without hematuria, Klebsiella UTI -Patient completed 3-day course of IV ceftriaxone on 01/29/2019 -Urine culture >100K Klebsiella pneumonia  Paroxysmal atrial fibrillation -Currently rate controlled, continue metoprolol -Continue Coumadin -INR currently 2.6  Hypothyroidism -Continue Synthroid  Obesity -BMI of 29.77, patient to discuss lifestyle modifications with her PCP  COVID negative  Procedures: None  Consultations: Gastroenterology  Discharge Exam: Vitals:   01/31/19 0700 01/31/19 0903  BP: 140/69 135/64  Pulse: 66 79  Resp: 18 18  Temp: 98 F (36.7 C) 97.9 F (36.6 C)  SpO2: 99% 98%     General: Well developed, well nourished, NAD, appears stated age  HEENT: NCAT, mucous membranes moist.  Cardiovascular: S1 S2 auscultated, RRR  Respiratory: Clear to auscultation bilaterally  Abdomen: Soft, nontender, nondistended, + bowel sounds  Extremities: warm dry without cyanosis clubbing or edema  Neuro: AAOx3, nonfocal  Psych: Pleasant, appropriate mood and affect  Discharge Instructions Discharge Instructions    Call MD for:  difficulty breathing, headache or visual disturbances   Complete by: As directed    Call MD for:  extreme fatigue   Complete by: As directed    Call MD for:  persistant dizziness or  light-headedness   Complete by: As directed    Call MD for:   persistant nausea and vomiting   Complete by: As directed    Call MD for:  severe uncontrolled pain   Complete by: As directed    Call MD for:  temperature >100.4   Complete by: As directed    Diet - low sodium heart healthy   Complete by: As directed    Soft diet for one week   Discharge instructions   Complete by: As directed    Patient will be discharged to [facility].  Patient will need to follow up with primary care provider within one week of discharge. Repeat INR.  Patient should continue medications as prescribed.  Patient should follow a soft diet.   Increase activity slowly   Complete by: As directed      Allergies as of 01/31/2019      Reactions   Iodinated Diagnostic Agents Shortness Of Breath, Other (See Comments)   "Allergic," per [facility]- Causes headaches, also   Zosyn [piperacillin Sod-tazobactam So] Other (See Comments)   "Allergic," per [facility]   Penicillins Other (See Comments)   Tolerated Zosyn Oct 2018, but "Allergic," per [facility] Did it involve swelling of the face/tongue/throat, SOB, or low BP? Unk Did it involve sudden or severe rash/hives, skin peeling, or any reaction on the inside of your mouth or nose? Unk Did you need to seek medical attention at a hospital or doctor's office? Unk When did it last happen? Unk If all above answers are "NO", may proceed with cephalosporin use.      Medication List    STOP taking these medications   naproxen sodium 220 MG tablet Commonly known as: ALEVE   QUEtiapine 25 MG tablet Commonly known as: SEROQUEL     TAKE these medications   acetaminophen 325 MG tablet Commonly known as: TYLENOL Take 650 mg by mouth daily.   vitamin B-12 1000 MCG tablet Commonly known as: CYANOCOBALAMIN Take 500 mcg by mouth 2 (two) times daily. What changed: Another medication with the same name was changed. Make sure you understand how and when to take each.   cyanocobalamin 1000 MCG/ML injection Commonly known as: (VITAMIN B-12) Inject  1 ml once a week for 1 month. Then 1 ml once a month thereafter. What changed:   how much to take  how to take this  when to take this  additional instructions   levothyroxine 88 MCG tablet Commonly known as: SYNTHROID Take 1 tablet (88 mcg total) by mouth daily before breakfast.   metoCLOPramide 5 MG tablet Commonly known as: REGLAN Take 1 tablet (5 mg total) by mouth 4 (four) times daily -  before meals and at bedtime.   metoprolol tartrate 50 MG tablet Commonly known as: LOPRESSOR TAKE ONE TABLET TWICE DAILY WITH A MEAL What changed: See the new instructions.   omeprazole 40 MG capsule Commonly known as: PRILOSEC Take 40 mg by mouth daily.   ondansetron 4 MG tablet Commonly known as: ZOFRAN Take 4 mg by mouth every 6 (six) hours as needed for nausea or vomiting (FOR 14 DAYS).   SYRINGE-NEEDLE (DISP) 3 ML 25G X 1-1/2" 3 ML Misc Use as needed for B12 injections   warfarin 5 MG tablet Commonly known as: COUMADIN Take as directed. If you are unsure how to take this medication, talk to your nurse or doctor. Original instructions: Take 1 tablet (5 mg total) by mouth daily at 6 PM. Hold until 8/18 What  changed:   how much to take  how to take this  when to take this  additional instructions      Allergies  Allergen Reactions   Iodinated Diagnostic Agents Shortness Of Breath and Other (See Comments)    "Allergic," per [facility]- Causes headaches, also   Zosyn [Piperacillin Sod-Tazobactam So] Other (See Comments)    "Allergic," per [facility]   Penicillins Other (See Comments)    Tolerated Zosyn Oct 2018, but "Allergic," per [facility] Did it involve swelling of the face/tongue/throat, SOB, or low BP? Unk Did it involve sudden or severe rash/hives, skin peeling, or any reaction on the inside of your mouth or nose? Unk Did you need to seek medical attention at a hospital or doctor's office? Unk When did it last happen? Unk If all above answers are "NO", may proceed with  cephalosporin use.     Contact information for follow-up providers    Charlotte Castro, Charlotte Halsted, MD. Schedule an appointment as soon as possible for a visit in 1 week(s).   Specialty: Internal Medicine Contact information: [place] [address] [phone]            Contact information for after-discharge care    Destination    [facility] .   Service: Skilled Nursing Contact information: [place] [phone]                   The results of significant diagnostics from this hospitalization (including imaging, microbiology, ancillary and laboratory) are listed below for reference.    Significant Diagnostic Studies: Ct Abdomen Pelvis Wo Contrast  Result Date: 01/27/2019 CLINICAL DATA:  Evaluate for bowel obstruction. Pain after eating. Emesis yesterday. EXAM: CT ABDOMEN AND PELVIS WITHOUT CONTRAST TECHNIQUE: Multidetector CT imaging of the abdomen and pelvis was performed following the standard protocol without IV contrast. COMPARISON:  04/05/2017 FINDINGS: Lower chest: No acute abnormality. Moderate size hiatal hernia identified with greater than 50% intrathoracic stomach. Hepatobiliary: No liver abnormality. Previous cholecystectomy. Mild fusiform dilatation of the CBD measures up to 1.2 cm. Pancreas: Unremarkable. No pancreatic ductal dilatation or surrounding inflammatory changes. Spleen: Normal in size without focal abnormality. Adrenals/Urinary Tract: Normal appearance of the adrenal glands. No kidney mass, kidney stone or hydronephrosis identified. Urinary bladder appears normal. Stomach/Bowel: No evidence of bowel wall thickening, distension or inflammation. The appendix is visualized and appears normal. No pathologic dilatation of the colon. Vascular/Lymphatic: Aortic atherosclerosis. No aneurysm. No abdominal or pelvic adenopathy. No inguinal adenopathy. Reproductive: Status post  hysterectomy. No adnexal masses. Other: No abdominal wall hernia or abnormality. No abdominopelvic ascites. Musculoskeletal: No acute or significant osseous findings. Chronic postoperative changes from interbody fusion L4-5 and L5-S1 with posterior decompression noted. IMPRESSION: 1. No evidence for bowel obstruction. 2. Large hiatal hernia 3.  Aortic Atherosclerosis (ICD10-I70.0). 4. Status post cholecystectomy with increase caliber of the CBD. Electronically Signed   By: Kerby Moors M.D.   On: 01/27/2019 10:46   Dg Chest 2 View  Result Date: 01/19/2019 CLINICAL DATA:  85 year old female with chest pain. EXAM: CHEST - 2 VIEW COMPARISON:  Chest radiograph dated 01/13/2019 FINDINGS: There is no focal consolidation, pleural effusion, or pneumothorax. Large hiatal hernia in noted. Stable cardiac silhouette. No acute osseous pathology. IMPRESSION: 1. No acute cardiopulmonary process. 2. Large hiatal hernia. Electronically Signed   By: Anner Crete M.D.   On: 01/19/2019 23:31   Dg Chest 2 View  Result Date: 01/13/2019 CLINICAL DATA:  Shortness of breath and chest pain EXAM: CHEST - 2 VIEW COMPARISON:  June 10, 2017 chest radiograph and chest CT Oct 26, 2017 FINDINGS: There is no edema or consolidation. Heart size and pulmonary vascularity are normal. No adenopathy. There is a large paraesophageal hernia. No bone lesions. No pneumothorax. IMPRESSION: Large paraesophageal hernia. No edema or consolidation. Stable cardiac silhouette. Electronically Signed   By: Lowella Grip III M.D.   On: 01/13/2019 16:42   Dg Abd 1 View  Result Date: 01/30/2019 CLINICAL DATA:  Abdominal pain EXAM: ABDOMEN - 1 VIEW COMPARISON:  01/14/2019 FINDINGS: Osseous demineralization with prior Ray cage fusion of L4-L5 and L5-S1. Surgical clips RIGHT upper quadrant question cholecystectomy. Normal bowel gas pattern without bowel dilatation or bowel wall thickening. No definite urinary tract calcification. IMPRESSION: Normal  bowel gas pattern. Electronically Signed   By: Lavonia Dana M.D.   On: 01/30/2019 08:16   Dg Abd 1 View  Result Date: 01/14/2019 CLINICAL DATA:  Nausea and vomiting EXAM: ABDOMEN - 1 VIEW COMPARISON:  None FINDINGS: No dilated loops of large or small bowel. No pathologic calcifications. No organomegaly. No aggressive osseous lesion. No intraperitoneal free air. Lower lumbar fusion. Degenerative narrowing of the hip joints. IMPRESSION: No bowel obstruction. Electronically Signed   By: Suzy Bouchard M.D.   On: 01/14/2019 14:37    Microbiology: Recent Results (from the past 240 hour(s))  Urine culture     Status: Abnormal   Collection Time: 01/27/19 10:42 AM   Specimen: Urine, Random  Result Value Ref Range Status   Specimen Description URINE, RANDOM  Final   Special Requests   Final    NONE Performed at [facility], [address] [place], [place] [address]    Culture >=100,000 COLONIES/mL KLEBSIELLA PNEUMONIAE (A)  Final   Report Status 01/30/2019 FINAL  Final   Organism ID, Bacteria KLEBSIELLA PNEUMONIAE (A)  Final      Susceptibility   Klebsiella pneumoniae - MIC*    AMPICILLIN >=32 RESISTANT Resistant     CEFAZOLIN <=4 SENSITIVE Sensitive     CEFTRIAXONE <=1 SENSITIVE Sensitive     CIPROFLOXACIN <=0.25 SENSITIVE Sensitive     GENTAMICIN <=1 SENSITIVE Sensitive     IMIPENEM <=0.25 SENSITIVE Sensitive     NITROFURANTOIN 64 INTERMEDIATE Intermediate     TRIMETH/SULFA <=20 SENSITIVE Sensitive     AMPICILLIN/SULBACTAM 4 SENSITIVE Sensitive     PIP/TAZO <=4 SENSITIVE Sensitive     Extended ESBL NEGATIVE Sensitive     * >=100,000 COLONIES/mL KLEBSIELLA PNEUMONIAE  SARS Coronavirus 2 [facility] order, Performed in [facility] hospital lab) Nasopharyngeal Nasopharyngeal Swab     Status: None   Collection Time: 01/27/19  1:26 PM   Specimen: Nasopharyngeal Swab  Result Value Ref Range Status   SARS Coronavirus 2 NEGATIVE NEGATIVE Final    Comment: (NOTE) If result is  NEGATIVE SARS-CoV-2 target nucleic acids are NOT DETECTED. The SARS-CoV-2 RNA is generally detectable in upper and lower  respiratory specimens during the acute phase of infection. The lowest  concentration of SARS-CoV-2 viral copies this assay can detect is 250  copies / mL. A negative result does not preclude SARS-CoV-2 infection  and should not be used as the sole basis for treatment or other  patient management decisions.  A negative result may occur with  improper specimen collection / handling, submission of specimen other  than nasopharyngeal swab, presence of viral mutation(s) within the  areas targeted by this assay, and inadequate number of  viral copies  (<250 copies / mL). A negative result must be combined with clinical  observations, patient history, and epidemiological information. If result is POSITIVE SARS-CoV-2 target nucleic acids are DETECTED. The SARS-CoV-2 RNA is generally detectable in upper and lower  respiratory specimens dur ing the acute phase of infection.  Positive  results are indicative of active infection with SARS-CoV-2.  Clinical  correlation with patient history and other diagnostic information is  necessary to determine patient infection status.  Positive results do  not rule out bacterial infection or co-infection with other viruses. If result is PRESUMPTIVE POSTIVE SARS-CoV-2 nucleic acids MAY BE PRESENT.   A presumptive positive result was obtained on the submitted specimen  and confirmed on repeat testing.  While 2019 novel coronavirus  (SARS-CoV-2) nucleic acids may be present in the submitted sample  additional confirmatory testing may be necessary for epidemiological  and / or clinical management purposes  to differentiate between  SARS-CoV-2 and other Sarbecovirus currently known to infect humans.  If clinically indicated additional testing with an alternate test  methodology [phone]) is advised. The SARS-CoV-2 RNA is generally  detectable  in upper and lower respiratory sp ecimens during the acute  phase of infection. The expected result is Negative. Fact Sheet for Patients:  StrictlyIdeas.no Fact Sheet for Healthcare Providers: BankingDealers.co.za This test is not yet approved or cleared by the [place] FDA and has been authorized for detection and/or diagnosis of SARS-CoV-2 by FDA under an Emergency Use Authorization (EUA).  This EUA will remain in effect (meaning this test can be used) for the duration of the COVID-19 declaration under Section 564(b)(1) of the Act, 21 U.S.C. section 360bbb-3(b)(1), unless the authorization is terminated or revoked sooner. Performed at [facility], [place] [address] [place], [place] [address]   MRSA PCR Screening     Status: None   Collection Time: 01/27/19  9:03 PM   Specimen: Nasal Mucosa; Nasopharyngeal  Result Value Ref Range Status   MRSA by PCR NEGATIVE NEGATIVE Final    Comment:        The GeneXpert MRSA Assay (FDA approved for NASAL specimens only), is one component of a comprehensive MRSA colonization surveillance program. It is not intended to diagnose MRSA infection nor to guide or monitor treatment for MRSA infections. Performed at [facility], [place] [address], [place], [place] [address]   SARS Coronavirus 2 [facility] order, Performed in [facility] hospital lab) Nasopharyngeal Nasopharyngeal Swab     Status: None   Collection Time: 01/31/19  2:05 PM   Specimen: Nasopharyngeal Swab  Result Value Ref Range Status   SARS Coronavirus 2 NEGATIVE NEGATIVE Final    Comment: (NOTE) If result is NEGATIVE SARS-CoV-2 target nucleic acids are NOT DETECTED. The SARS-CoV-2 RNA is generally detectable in upper and lower  respiratory specimens during the acute phase of infection. The lowest  concentration of SARS-CoV-2 viral copies this assay can detect is 250  copies / mL. A negative result does not preclude  SARS-CoV-2 infection  and should not be used as the sole basis for treatment or other  patient management decisions.  A negative result may occur with  improper specimen collection / handling, submission of specimen other  than nasopharyngeal swab, presence of viral mutation(s) within the  areas targeted by this assay, and inadequate number of viral copies  (<250 copies / mL). A negative result must be combined with clinical  observations, patient history, and epidemiological information. If result is POSITIVE  SARS-CoV-2 target nucleic acids are DETECTED. The SARS-CoV-2 RNA is generally detectable in upper and lower  respiratory specimens dur ing the acute phase of infection.  Positive  results are indicative of active infection with SARS-CoV-2.  Clinical  correlation with patient history and other diagnostic information is  necessary to determine patient infection status.  Positive results do  not rule out bacterial infection or co-infection with other viruses. If result is PRESUMPTIVE POSTIVE SARS-CoV-2 nucleic acids MAY BE PRESENT.   A presumptive positive result was obtained on the submitted specimen  and confirmed on repeat testing.  While 2019 novel coronavirus  (SARS-CoV-2) nucleic acids may be present in the submitted sample  additional confirmatory testing may be necessary for epidemiological  and / or clinical management purposes  to differentiate between  SARS-CoV-2 and other Sarbecovirus currently known to infect humans.  If clinically indicated additional testing with an alternate test  methodology [phone]) is advised. The SARS-CoV-2 RNA is generally  detectable in upper and lower respiratory sp ecimens during the acute  phase of infection. The expected result is Negative. Fact Sheet for Patients:  StrictlyIdeas.no Fact Sheet for Healthcare Providers: BankingDealers.co.za This test is not yet approved or cleared by the  [place] FDA and has been authorized for detection and/or diagnosis of SARS-CoV-2 by FDA under an Emergency Use Authorization (EUA).  This EUA will remain in effect (meaning this test can be used) for the duration of the COVID-19 declaration under Section 564(b)(1) of the Act, 21 U.S.C. section 360bbb-3(b)(1), unless the authorization is terminated or revoked sooner. Performed at [facility], [place] [address] [place], [place] [address]      Labs: Basic Metabolic Panel: Recent Labs  Lab 01/27/19 0854 01/28/19 0621 01/30/19 0545  NA 141 137 143  K 4.4 3.5 3.7  CL 104 105 112*  CO2 25 21* 24  GLUCOSE 112* 113* 100*  BUN 20 20 10   CREATININE 1.15* 1.33* 0.94  CALCIUM 10.0 8.5* 8.9  MG  --   --  1.8   Liver Function Tests: Recent Labs  Lab 01/27/19 0854 01/28/19 0621 01/30/19 0545  AST 23 16 20   ALT 15 13 17   ALKPHOS 61 55 55  BILITOT 1.4* 0.9 0.9  PROT 6.7 5.6* 5.4*  ALBUMIN 3.6 3.0* 2.8*   Recent Labs  Lab 01/27/19 0854  LIPASE 37   No results for input(s): AMMONIA in the last 168 hours. CBC: Recent Labs  Lab 01/27/19 0854 01/30/19 0545  WBC 8.9 5.7  HGB 14.9 13.0  HCT 42.2 37.3  MCV 93.6 94.0  PLT 227 175   Cardiac Enzymes: No results for input(s): CKTOTAL, CKMB, CKMBINDEX, TROPONINI in the last 168 hours. BNP: BNP (last 3 results) No results for input(s): BNP in the last 8760 hours.  ProBNP (last 3 results) No results for input(s): PROBNP in the last 8760 hours.  CBG: Recent Labs  Lab 01/29/19 1629  GLUCAP 148*       Signed:  Treveon Bourcier  [facility] 01/31/2019, 3:54 PM

## 2019-01-31 NOTE — Progress Notes (Signed)
Charlotte Castro to be discharged to [facility] per MD order. Patient verbalized understanding.  Skin clean, dry and intact without evidence of skin break down, no evidence of skin tears noted. IV catheter discontinued intact. Site without signs and symptoms of complications. Dressing and pressure applied. Pt denies pain at the site currently. No complaints noted.  Patient free of lines, drains, and wounds.   Discharge packet assembled. An After Visit Summary (AVS) was printed and given to the EMS personnel. Patient escorted via stretcher and discharged to [facility] via ambulance. Report called to accepting facility; all questions and concerns addressed.   Suzy Bouchard, RN

## 2019-01-31 NOTE — Progress Notes (Signed)
ANTICOAGULATION CONSULT NOTE - Follow-Up Consult  Pharmacy Consult for warfarin Indication: atrial fibrillation  Vital Signs: Temp: 98 F (36.7 C) (08/19 0700) Temp Source: Oral (08/19 0700) BP: 140/69 (08/19 0700) Pulse Rate: 66 (08/19 0700)  Labs: Recent Labs    01/29/19 0655 01/30/19 0545 01/31/19 0645  HGB  --  13.0  --   HCT  --  37.3  --   PLT  --  175  --   LABPROT 34.9* 30.9* 27.5*  INR 3.5* 3.0* 2.6*  CREATININE  --  0.94  --     Estimated Creatinine Clearance: 51.8 mL/min (by C-G formula based on SCr of 0.94 mg/dL).   Assessment: 80 yoF presents to ED with abdominal pain and N/V. The patient was on warfarin PTA for hx Afib - pharmacy consulted to resume dosing this admission.  PTA warfarin regimen: 5mg  daily (per SNF [facility]) however last [facility] clinic note (11/30/18) stated 5 mg daily EXCEPT for 2.5 mg on Sundays only.   INR today has trended down into the therapeutic range (INR 2.6<< 3 << 3.5, goal of 2-3). Jump up in INR earlier this admission likely related to Bactrim dose given on 8/15 in addition to poor po intake with N/V. CBC wnl, no bleeding noted, po intake variable. Will continue with a low dose this evening and watch INR trends closely.  Goal of Therapy:  INR 2-3 Monitor platelets by anticoagulation protocol: Yes   Plan:  - Warfarin 2.5 mg x 1 dose at 1800 today - - Daily PT/INR, CBC q72h - Will continue to monitor for any signs/symptoms of bleeding and will follow up with PT/INR in the a.m   Neko Mcgeehan A. Levada Dy, PharmD, BCPS, FNKF Clinical Pharmacist [place] Please utilize Amion for appropriate phone number to reach the unit pharmacist ([place])    .

## 2019-01-31 NOTE — Progress Notes (Signed)
Called report to [facility] and gave report to [place].   Paulla Fore, RN

## 2019-01-31 NOTE — Plan of Care (Signed)
  Problem: Education: Goal: Knowledge of General Education information will improve Description: Including pain rating scale, medication(s)/side effects and non-pharmacologic comfort measures Outcome: Progressing   Problem: Safety: Goal: Ability to remain free from injury will improve Outcome: Progressing   

## 2019-02-21 ENCOUNTER — Telehealth: Payer: Self-pay | Admitting: Internal Medicine

## 2019-02-21 NOTE — Telephone Encounter (Signed)
Darlina Guys, from [facility], calling stating they would be able to see pt on 03/01/2019. Please advise.   [phone] secure

## 2019-02-27 NOTE — Telephone Encounter (Signed)
Spoke with Darlina Guys.  Nothing else needed at this time.

## 2019-02-28 ENCOUNTER — Encounter: Payer: Self-pay | Admitting: Internal Medicine

## 2019-02-28 ENCOUNTER — Other Ambulatory Visit: Payer: Self-pay

## 2019-02-28 ENCOUNTER — Ambulatory Visit (INDEPENDENT_AMBULATORY_CARE_PROVIDER_SITE_OTHER): Payer: Medicare Other | Admitting: Internal Medicine

## 2019-02-28 VITALS — BP 110/70 | HR 83 | Temp 97.7°F | Wt 197.2 lb

## 2019-02-28 DIAGNOSIS — E538 Deficiency of other specified B group vitamins: Secondary | ICD-10-CM

## 2019-02-28 DIAGNOSIS — Z23 Encounter for immunization: Secondary | ICD-10-CM | POA: Diagnosis not present

## 2019-02-28 DIAGNOSIS — I4821 Permanent atrial fibrillation: Secondary | ICD-10-CM

## 2019-02-28 DIAGNOSIS — I5032 Chronic diastolic (congestive) heart failure: Secondary | ICD-10-CM | POA: Diagnosis not present

## 2019-02-28 DIAGNOSIS — Z09 Encounter for follow-up examination after completed treatment for conditions other than malignant neoplasm: Secondary | ICD-10-CM

## 2019-02-28 DIAGNOSIS — I10 Essential (primary) hypertension: Secondary | ICD-10-CM

## 2019-02-28 DIAGNOSIS — Z7901 Long term (current) use of anticoagulants: Secondary | ICD-10-CM | POA: Diagnosis not present

## 2019-02-28 DIAGNOSIS — E039 Hypothyroidism, unspecified: Secondary | ICD-10-CM

## 2019-02-28 DIAGNOSIS — C541 Malignant neoplasm of endometrium: Secondary | ICD-10-CM

## 2019-02-28 LAB — COMPREHENSIVE METABOLIC PANEL
ALT: 11 U/L (ref 0–35)
AST: 16 U/L (ref 0–37)
Albumin: 3.9 g/dL (ref 3.5–5.2)
Alkaline Phosphatase: 65 U/L (ref 39–117)
BUN: 20 mg/dL (ref 6–23)
CO2: 25 mEq/L (ref 19–32)
Calcium: 9.5 mg/dL (ref 8.4–10.5)
Chloride: 109 mEq/L (ref 96–112)
Creatinine, Ser: 0.81 mg/dL (ref 0.40–1.20)
GFR: 67.34 mL/min (ref >60.00)
Glucose, Bld: 95 mg/dL (ref 70–99)
Potassium: 4.1 mEq/L (ref 3.5–5.1)
Sodium: 143 mEq/L (ref 135–145)
Total Bilirubin: 0.7 mg/dL (ref 0.2–1.2)
Total Protein: 6.4 g/dL (ref 6.0–8.3)

## 2019-02-28 LAB — CBC WITH DIFFERENTIAL/PLATELET
Basophils Absolute: 0.1 10*3/uL (ref 0.0–0.1)
Basophils Relative: 0.8 % (ref 0.0–3.0)
Eosinophils Absolute: 0.3 10*3/uL (ref 0.0–0.7)
Eosinophils Relative: 4.4 % (ref 0.0–5.0)
HCT: 41.4 % (ref 36.0–46.0)
Hemoglobin: 14.2 g/dL (ref 12.0–15.0)
Lymphocytes Relative: 17.6 % (ref 12.0–46.0)
Lymphs Abs: 1.3 10*3/uL (ref 0.7–4.0)
MCHC: 34.2 g/dL (ref 30.0–36.0)
MCV: 98.3 fl (ref 78.0–100.0)
Monocytes Absolute: 0.7 10*3/uL (ref 0.1–1.0)
Monocytes Relative: 9.9 % (ref 3.0–12.0)
Neutro Abs: 5 10*3/uL (ref 1.4–7.7)
Neutrophils Relative %: 67.3 % (ref 43.0–77.0)
Platelets: 199 10*3/uL (ref 150.0–400.0)
RBC: 4.21 Mil/uL (ref 3.87–5.11)
RDW: 14.9 % (ref 11.5–15.5)
WBC: 7.4 10*3/uL (ref 4.0–10.5)

## 2019-02-28 MED ORDER — CYANOCOBALAMIN 1000 MCG/ML IJ SOLN
1000.0000 ug | Freq: Once | INTRAMUSCULAR | Status: AC
  Administered 2019-02-28: 17:00:00 1000 ug via INTRAMUSCULAR

## 2019-02-28 NOTE — Patient Instructions (Signed)
-  Nice seeing you today!!  -Lab work today; will notify you once results are available.  -Flu, Pneumonia vaccines today.  -B12 injection today.  -Schedule follow up in 3 months.

## 2019-02-28 NOTE — Addendum Note (Signed)
Addended by: Westley Hummer B on: 02/28/2019 04:58 PM   Modules accepted: Orders

## 2019-02-28 NOTE — Progress Notes (Signed)
Established Patient Office Visit     CC/Reason for Visit: Hospital follow-up  HPI: Charlotte Castro is a 85 y.o. female who is coming in today for the above mentioned reasons. Past Medical History is significant for: Endometrial cancer, chronic diastolic heart failure, hypertension, atrial fibrillation anticoagulated on warfarin and anxiety.  She was hospitalized from August 15 through August 19 due to a Klebsiella UTI and intractable nausea and vomiting.  She also had severe weakness and deconditioning and was sent to skilled nursing facility for rehab where she spent 21 days.  She was discharged 2 weeks ago, she refused home health PT but complains of still feeling weak.  She does not recall having had blood work other than for her Coumadin level since her discharge.  She is requesting flu vaccinations and she is due for her B12 injection today.   Past Medical/Surgical History: Past Medical History:  Diagnosis Date  . Anemia    history of  . Anxiety   . Arthritis    "left knee" (04/05/2017)  . Atrial fibrillation ([place])   . Atrial flutter ([place])    typical appearing  . Atrial tachycardia ([place])    ablated 11/17/10  by JA  from the [facility] of the aorta  . Chest pain on exertion   . CHF (congestive heart failure) ([place])   . Chronic lower back pain   . Chronic nausea   . Dizziness    chronic and of an unclear etiology  . Dyspnea   . Endometrial cancer (HCC)    grade 1  . Gallstone pancreatitis   . Gastritis   . GERD (gastroesophageal reflux disease)   . History of blood transfusion ~ 1948  . History of hiatal hernia   . HTN (hypertension)   . Hyperlipemia   . Hypothyroidism   . Left leg numbness   . Obesity   . Osteoporosis   . PMB (postmenopausal bleeding)   . Sinus headache   . Toe ulcer ([place])    left 3rd toe  . Vitamin D deficiency     Past Surgical History:  Procedure Laterality Date  . ATRIAL ABLATION SURGERY  11/17/10   Atrial tachycardia arising from [facility] of the  aorta ablated by JA  . BACK SURGERY    . CARDIAC CATHETERIZATION  01/11/2011   Archie Endo 01/12/2011  . CHOLECYSTECTOMY N/A 04/08/2017   Procedure: LAPAROSCOPIC CHOLECYSTECTOMY;  Surgeon: Georganna Skeans, MD;  Location: [place];  Service: General;  Laterality: N/A;  . FRACTURE SURGERY    . HYSTEROSCOPY W/D&C N/A 08/05/2017   Procedure: DILATATION AND CURETTAGE /HYSTEROSCOPY AND POLYPECTOMY;  Surgeon: Osborne Oman, MD;  Location: [facility];  Service: Gynecology;  Laterality: N/A;  . LUMBAR SPINE SURGERY  ~ 1998  . ROBOTIC ASSISTED TOTAL HYSTERECTOMY WITH BILATERAL SALPINGO OOPHERECTOMY Bilateral 09/20/2017   Procedure: XI ROBOTIC ASSISTED TOTAL HYSTERECTOMY WITH BILATERAL SALPINGO OOPHORECTOMY, SENTINAL LYMPH NODE BIOPSY;  Surgeon: Everitt Amber, MD;  Location[facility];  Service: Gynecology;  Laterality: Bilateral;  . SHOULDER SURGERY Right 1984 X2   Rollene Rotunda 01/19/2011  . WRIST FRACTURE SURGERY Left 1983    Social History:  reports that she has never smoked. She has never used smokeless tobacco. She reports that she does not drink alcohol or use drugs.  Allergies: Allergies  Allergen Reactions  . Iodinated Diagnostic Agents Shortness Of Breath and Other (See Comments)    "Allergic," per [facility]- Causes headaches, also  . Zosyn [Piperacillin Sod-Tazobactam So] Other (See Comments)    "  Allergic," per [facility]  . Penicillins Other (See Comments)    Tolerated Zosyn Oct 2018, but "Allergic," per [facility] Did it involve swelling of the face/tongue/throat, SOB, or low BP? Unk Did it involve sudden or severe rash/hives, skin peeling, or any reaction on the inside of your mouth or nose? Unk Did you need to seek medical attention at a hospital or doctor's office? Unk When did it last happen? Unk If all above answers are "NO", may proceed with cephalosporin use.     Family History:  Family History  Problem Relation Age of Onset  . Heart attack Father   . Hypertension Father      Current Outpatient Medications:   .  acetaminophen (TYLENOL) 325 MG tablet, Take 650 mg by mouth daily., Disp: , Rfl:  .  cyanocobalamin (,VITAMIN B-12,) 1000 MCG/ML injection, Inject 1 ml once a week for 1 month. Then 1 ml once a month thereafter. (Patient taking differently: Inject 1,000 mcg into the muscle See admin instructions. Inject 1,000 mcg IM every 7 days for four weeks), Disp: 30 mL, Rfl: 0 .  levothyroxine (SYNTHROID, LEVOTHROID) 88 MCG tablet, Take 1 tablet (88 mcg total) by mouth daily before breakfast., Disp: 90 tablet, Rfl: 3 .  metoCLOPramide (REGLAN) 5 MG tablet, Take 1 tablet (5 mg total) by mouth 4 (four) times daily -  before meals and at bedtime., Disp: , Rfl:  .  metoprolol tartrate (LOPRESSOR) 50 MG tablet, TAKE ONE TABLET TWICE DAILY WITH A MEAL (Patient taking differently: Take 50 mg by mouth 2 (two) times daily with a meal. ), Disp: 60 tablet, Rfl: 7 .  omeprazole (PRILOSEC) 40 MG capsule, Take 40 mg by mouth daily., Disp: , Rfl:  .  SYRINGE-NEEDLE, DISP, 3 ML 25G X 1-1/2" 3 ML MISC, Use as needed for B12 injections, Disp: 100 each, Rfl: 3 .  vitamin B-12 (CYANOCOBALAMIN) 1000 MCG tablet, Take 500 mcg by mouth 2 (two) times daily., Disp: , Rfl:  .  warfarin (COUMADIN) 5 MG tablet, Take 1 tablet (5 mg total) by mouth daily at 6 PM. Hold until 8/18, Disp: , Rfl:   Review of Systems:  Constitutional: Denies fever, chills, diaphoresis, appetite change and fatigue.  HEENT: Denies photophobia, eye pain, redness, hearing loss, ear pain, congestion, sore throat, rhinorrhea, sneezing, mouth sores, trouble swallowing, neck pain, neck stiffness and tinnitus.   Respiratory: Denies SOB, DOE, cough, chest tightness,  and wheezing.   Cardiovascular: Denies chest pain, palpitations and leg swelling.  Gastrointestinal: Denies nausea, vomiting, abdominal pain, diarrhea, constipation, blood in stool and abdominal distention.  Genitourinary: Denies dysuria, urgency, frequency, hematuria, flank pain and difficulty urinating.   Endocrine: Denies: hot or cold intolerance, sweats, changes in hair or nails, polyuria, polydipsia. Musculoskeletal: Denies myalgias, back pain, joint swelling, arthralgias and gait problem.  Skin: Denies pallor, rash and wound.  Neurological: Denies dizziness, seizures, syncope, light-headedness, numbness and headaches.  Hematological: Denies adenopathy. Easy bruising, personal or family bleeding history  Psychiatric/Behavioral: Denies suicidal ideation, mood changes, confusion, nervousness, sleep disturbance and agitation    Physical Exam: Vitals:   02/28/19 1306  BP: 110/70  Pulse: 83  Temp: 97.7 F (36.5 C)  TempSrc: Temporal  SpO2: 97%  Weight: 197 lb 3.2 oz (89.4 kg)    Body mass index is 29.98 kg/m.   Constitutional: NAD, calm, comfortable, ambulates with a cane Eyes: PERRL, lids and conjunctivae normal ENMT: Mucous membranes are moist Respiratory: clear to auscultation bilaterally, no wheezing, no crackles. Normal respiratory effort.  No accessory muscle use.  Cardiovascular: Regular rate and rhythm, no murmurs / rubs / gallops. No extremity edema. 2+ pedal pulses. No carotid bruits.  Abdomen: no tenderness, no masses palpated. No hepatosplenomegaly. Bowel sounds positive.  Musculoskeletal: no clubbing / cyanosis. No joint deformity upper and lower extremities. Good ROM, no contractures. Normal muscle tone.  Skin: no rashes, lesions, ulcers. No induration Neurologic: Grossly intact and nonfocal Psychiatric: Normal judgment and insight. Alert and oriented x 3. Normal mood.    Impression and Plan:  Hospital discharge follow-up -Check CBC and C met today. -She still has some weakness after SNF but refuses home health PT. -She no longer has dysuria, she completed antibiotic therapy.  Permanent atrial fibrillation Long term current use of anticoagulant therapy -Rate controlled, she follows with Coumadin clinic through the cardiology office.  Essential hypertension,  benign  -Well-controlled.  Chronic diastolic CHF (congestive heart failure) (HCC)  -Compensated.  B12 deficiency -IM injection today.  Hypothyroidism, unspecified type -Last TSH was 2.690 in April 2020, continue Synthroid.  Endometrial cancer [facility]) -She has now completed radiation therapy.    Patient Instructions  -Nice seeing you today!!  -Lab work today; will notify you once results are available.  -Flu, Pneumonia vaccines today.  -B12 injection today.  -Schedule follow up in 3 months.     Lelon Frohlich, MD [facility] at [facility]

## 2019-03-05 ENCOUNTER — Telehealth: Payer: Self-pay | Admitting: Internal Medicine

## 2019-03-05 NOTE — Telephone Encounter (Signed)
Pt daughter called and stated that the pt seems very weak and thinks that is coming from the medication levothyroxine (SYNTHROID, LEVOTHROID) 88 MCG tablet [id number]  Pt daughter would like a call from the nurse regarding. Please advise

## 2019-03-05 NOTE — Telephone Encounter (Signed)
Clinic RN attempted to contact daughter. No answer. LVM for return call. Dr. Jerilee Hoh last TSH was 5.2 one month ago these labs were drawn by Cardio. Is the current dose of Levothyroxine sufficient?

## 2019-03-05 NOTE — Telephone Encounter (Signed)
Patient's daughter is returning call to [place].

## 2019-03-06 ENCOUNTER — Other Ambulatory Visit: Payer: Self-pay | Admitting: Internal Medicine

## 2019-03-06 DIAGNOSIS — E039 Hypothyroidism, unspecified: Secondary | ICD-10-CM

## 2019-03-06 NOTE — Telephone Encounter (Signed)
Spoke with daughter. Informed her of Dr. Jerilee Hoh message. Patient declined [facility] for PT and OT. Lab appointment scheduled for 10/7 at 1040.

## 2019-03-06 NOTE — Telephone Encounter (Signed)
Spoke with patient daughter(Tammy). Dr. Jerilee Hoh last TSH was 5.2 one month ago these labs were drawn by Cardio. Is the current dose of Levothyroxine sufficient? Patient daughter reports her only sx is weakness, wonders if it is the brand of medication. Please contact daughter at [phone]. Daughter is very appreciative of Dr. Jerilee Hoh and her pep talk.

## 2019-03-06 NOTE — Telephone Encounter (Signed)
Let;s have her come in for a TSH. The last one was done while hospitalized so not very accurate. She also was just hospitalized with a UTI and required a 20 day SNF stay for rehab, so that may be part of it. Are they interested in [facility] PT/OT? If so, ok to order.

## 2019-03-21 ENCOUNTER — Other Ambulatory Visit: Payer: Self-pay

## 2019-04-16 ENCOUNTER — Other Ambulatory Visit: Payer: Self-pay | Admitting: Interventional Cardiology

## 2019-04-16 DIAGNOSIS — I4821 Permanent atrial fibrillation: Secondary | ICD-10-CM

## 2019-04-24 ENCOUNTER — Telehealth: Payer: Self-pay | Admitting: Interventional Cardiology

## 2019-04-24 NOTE — Telephone Encounter (Signed)
New Message     Pt is calling and says she will need her son to assist her to her appt because she is weak and uses a walker     Please call

## 2019-04-24 NOTE — Telephone Encounter (Signed)
Due to reported issue, pt advised ok for her son to accompany to her appt Friday. Patient verbalized understanding and agreeable to plan.

## 2019-04-24 NOTE — Progress Notes (Signed)
Cardiology Office Note   Date:  04/27/2019   ID:  Keigan, Semones 22-Apr-1933, MRN [id number]  PCP:  Isaac Bliss, Rayford Halsted, MD    No chief complaint on file.  AFib  Wt Readings from Last 3 Encounters:  04/27/19 192 lb (87.1 kg)  02/28/19 197 lb 3.2 oz (89.4 kg)  01/30/19 195 lb 1.7 oz (88.5 kg)       History of Present Illness: Charlotte Castro is a 85 y.o. female  who has had several different types of tachyarrhythmia and bradycardia which have been difficult to manage over the past few years, since 2012-13; due to other medical issues. She has had syncope and was hospitalized in 2014. We have tried to evaluate her rhythm at home to see if she has significant pauses or heart rates below 40. She does not want to wear any type of heart monitor.   Her daughter came in the past, several years ago, annoyed with her mothers current health condition. Daughter stated she is self-employed so she has to take time off from work every time the mother is sick. She "wanted to get to the bottom of this." She was sent back to Dr. Rayann Heman. THere was no indication for pacer. She had a grey coating on her eyelid-thought to be related to Amiodarone. She is nowoff of amiodarone.  The patient was seeing neurology, but she stopped. She has a tremor and had been started on Parkinson's medicines. These aparently have been stopped. The patient disagreed with the diagnosis of Parkinsons. She was seen in the emergency room for syncope in 2014. Her main complaint is feeling weak and having joint pains. She does admit to significant anxiety which happens at random times. The daughter felt that anxiety is a big component of the patient's illness, when she wasthere at the last visit in 2/14.  THe patient was seen by ortho and rheumatology in the past. She took prednisone but this was tapered off. She still reports no energy and fatigue. She has had some difficulty in regulating her INR. She is  willing to try Eliquis but not Xarelto. She heard bad things about Xarelto.  She had problems getting her Eliquis. It apparently was not covered and would be very expensive. SHe switched to warfarin.  She had some mild hematuria in the past before warfarin which has resolved.   In the past, she had a large hiatal hernia and was referred to surgery, but she declined an operation. She is trying to stick to a soft diet.   She had GYN surgery, records reveal: "On 09/20/2017, the patient underwent the following: Procedure(s): XI ROBOTIC ASSISTED TOTAL HYSTERECTOMY WITH BILATERAL SALPINGO OOPHORECTOMY, SENTINAL LYMPH NODE BIOPSY. The postoperative course was uneventful. She was discharged to home on postoperative day 1tolerating a regular diet, ambulating, pain controlled.She is to resume her Coumadin 5 days after surgery. She was treated with radiation as well.    She had some gall stones removed.   who has had several different types of tachyarrhythmia and bradycardia which have been difficult to manage over the past few years, since 2012-13; due to other medical issues. She has had syncope and was hospitalized in 2014. We have tried to evaluate her rhythm at home to see if she has significant pauses or heart rates below 40. She does not want to wear any type of heart monitor.   Her daughter came in the past, several years ago, annoyed with her mothers current health  condition. Daughter stated she is self-employed so she has to take time off from work every time the mother is sick. She "wanted to get to the bottom of this." She was sent back to Dr. Rayann Heman. THere was no indication for pacer. She had a grey coating on her eyelid-thought to be related to Amiodarone. She is nowoff of amiodarone.  The patient was seeing neurology, but she stopped. She has a tremor and had been started on Parkinson's medicines. These aparently have been stopped. The patient disagreed with the diagnosis of  Parkinsons. She was seen in the emergency room for syncope in 2014. Her main complaint is feeling weak and having joint pains. She does admit to significant anxiety which happens at random times. The daughter felt that anxiety is a big component of the patient's illness, when she wasthere at the last visit in 2/14.  THe patient was seen by ortho and rheumatology in the past. She took prednisone but this was tapered off. She still reports no energy and fatigue. She has had some difficulty in regulating her INR. She is willing to try Eliquis but not Xarelto. She heard bad things about Xarelto.  She had problems getting her Eliquis. It apparently was not covered and would be very expensive. SHe switched to warfarin.  She had some mild hematuria in the past before warfarin which has resolved.   In the past, she had a large hiatal hernia and was referred to surgery, but she declined an operation. She is trying to stick to a soft diet.   She had GYN surgery, records reveal: "On 09/20/2017, the patient underwent the following: Procedure(s): XI ROBOTIC ASSISTED TOTAL HYSTERECTOMY WITH BILATERAL SALPINGO OOPHORECTOMY, SENTINAL LYMPH NODE BIOPSY. The postoperative course was uneventful. She was discharged to home on postoperative day 1tolerating a regular diet, ambulating, pain controlled.She is to resume her Coumadin 5 days after surgery. She was treated with radiation as well.    She had some gall stones removed.   Since the last visit, she feels weak. She was in the hospital in 01/2019 for gallstone pancreatitis.  She had a UTI as well.    Denies : Chest pain. Dizziness. Leg edema. Nitroglycerin use. Orthopnea.  Paroxysmal nocturnal dyspnea. Syncope.    She still feels weak.  Some dizziness as well.  She is back at home.  Her son helps her. She feels shaky in her legs. Her diet is limited by her GI issues.  She has gained some weight since being in the hospital.    Past Medical History:   Diagnosis Date   Anemia    history of   Anxiety    Arthritis    "left knee" (04/05/2017)   Atrial fibrillation (HCC)    Atrial flutter ([place])    typical appearing   Atrial tachycardia ([place])    ablated 11/17/10  by JA  from the [facility] of the aorta   Chest pain on exertion    CHF (congestive heart failure) (HCC)    Chronic lower back pain    Chronic nausea    Dizziness    chronic and of an unclear etiology   Dyspnea    Endometrial cancer (HCC)    grade 1   Gallstone pancreatitis    Gastritis    GERD (gastroesophageal reflux disease)    History of blood transfusion ~ 1948   History of hiatal hernia    HTN (hypertension)    Hyperlipemia    Hypothyroidism    Left leg numbness  Obesity    Osteoporosis    PMB (postmenopausal bleeding)    Sinus headache    Toe ulcer ([place])    left 3rd toe   Vitamin D deficiency     Past Surgical History:  Procedure Laterality Date   ATRIAL ABLATION SURGERY  11/17/10   Atrial tachycardia arising from [facility] of the aorta ablated by [place] CATHETERIZATION  01/11/2011   Archie Endo 01/12/2011   CHOLECYSTECTOMY N/A 04/08/2017   Procedure: LAPAROSCOPIC CHOLECYSTECTOMY;  Surgeon: Georganna Skeans, MD;  Location: [place];  Service: General;  Laterality: N/A;   FRACTURE SURGERY     HYSTEROSCOPY W/D&C N/A 08/05/2017   Procedure: DILATATION AND CURETTAGE /HYSTEROSCOPY AND POLYPECTOMY;  Surgeon: Osborne Oman, MD;  Location: [facility];  Service: Gynecology;  Laterality: N/A;   LUMBAR SPINE SURGERY  ~ [place] SALPINGO OOPHERECTOMY Bilateral 09/20/2017   Procedure: XI ROBOTIC ASSISTED TOTAL HYSTERECTOMY WITH BILATERAL SALPINGO OOPHORECTOMY, SENTINAL LYMPH NODE BIOPSY;  Surgeon: Everitt Amber, MD;  Location[facility];  Service: Gynecology;  Laterality: Bilateral;   SHOULDER SURGERY Right 1984 X2   /ntoes 01/19/2011   WRIST FRACTURE SURGERY Left 1983     Current  Outpatient Medications  Medication Sig Dispense Refill   acetaminophen (TYLENOL) 325 MG tablet Take 650 mg by mouth daily.     levothyroxine (SYNTHROID, LEVOTHROID) 88 MCG tablet Take 1 tablet (88 mcg total) by mouth daily before breakfast. 90 tablet 3   metoprolol tartrate (LOPRESSOR) 50 MG tablet TAKE ONE TABLET TWICE DAILY WITH A MEAL (Patient taking differently: Take 50 mg by mouth 2 (two) times daily with a meal. ) 60 tablet 7   vitamin B-12 (CYANOCOBALAMIN) 1000 MCG tablet Take 500 mcg by mouth 2 (two) times daily.     warfarin (COUMADIN) 5 MG tablet Take as directed by the Coumadin Clinic 14 tablet 0   No current facility-administered medications for this visit.     Allergies:   Iodinated diagnostic agents, Zosyn [piperacillin sod-tazobactam so], and Penicillins    Social History:  The patient  reports that she has never smoked. She has never used smokeless tobacco. She reports that she does not drink alcohol or use drugs.   Family History:  The patient's family history includes Heart attack in her father; Hypertension in her father.    ROS:  Please see the history of present illness.   Otherwise, review of systems are positive for weakness.   All other systems are reviewed and negative.    PHYSICAL EXAM: VS:  BP (!) 96/56    Pulse 72    Ht 5\' 8"  (1.727 m)    Wt 192 lb (87.1 kg)    SpO2 99%    BMI 29.19 kg/m  , BMI Body mass index is 29.19 kg/m. GEN: Well nourished, well developed, in no acute distress  HEENT: normal  Neck: no JVD, carotid bruits, or masses Cardiac: RRR; no murmurs, rubs, or gallops,no edema  Respiratory:  clear to auscultation bilaterally, normal work of breathing GI: soft, nontender, nondistended, + BS MS: no deformity or atrophy  Skin: warm and dry, no rash Neuro:  Strength and sensation are intact Psych: euthymic mood, full affect   EKG:   The ekg ordered 01/2019 demonstrates AFib, rate controlled   Recent Labs: 01/13/2019: TSH  5.280 01/30/2019: Magnesium 1.8 02/28/2019: ALT 11; BUN 20; Creatinine, Ser 0.81; Hemoglobin 14.2; Platelets 199.0; Potassium 4.1; Sodium 143  Lipid Panel No results found for: CHOL, TRIG, HDL, CHOLHDL, VLDL, LDLCALC, LDLDIRECT   Other studies Reviewed: Additional studies/ records that were reviewed today with results demonstrating: labs reviewed.  TSH normal at last check.   ASSESSMENT AND PLAN:  1. AFib: rate controlled.  Appears to be in NSR today. 2. Chronic fatigue/SHOB: Try to stay active without taking risk of falling.  Eat well and stay well hydrated.   3. HTN: The current medical regimen is effective;  continue present plan and medications.  Stay well hydrated.  4. Anticoagulated: No bleeding issues.    Current medicines are reviewed at length with the patient today.  The patient concerns regarding her medicines were addressed.  The following changes have been made:  No change  Labs/ tests ordered today include:  No orders of the defined types were placed in this encounter.   Recommend 150 minutes/week of aerobic exercise Low fat, low carb, high fiber diet recommended  Disposition:   FU in 6 month with APP   Signed, Larae Grooms, MD  04/27/2019 8:20 AM    [facility] [address], [place], [place]  [address] Phone: [phone]; Fax: [phone]

## 2019-04-27 ENCOUNTER — Other Ambulatory Visit: Payer: Self-pay

## 2019-04-27 ENCOUNTER — Ambulatory Visit (INDEPENDENT_AMBULATORY_CARE_PROVIDER_SITE_OTHER): Payer: Medicare Other | Admitting: *Deleted

## 2019-04-27 ENCOUNTER — Encounter: Payer: Self-pay | Admitting: Interventional Cardiology

## 2019-04-27 ENCOUNTER — Encounter

## 2019-04-27 ENCOUNTER — Ambulatory Visit (INDEPENDENT_AMBULATORY_CARE_PROVIDER_SITE_OTHER): Payer: Medicare Other | Admitting: Interventional Cardiology

## 2019-04-27 VITALS — BP 96/56 | HR 72 | Ht 68.0 in | Wt 192.0 lb

## 2019-04-27 DIAGNOSIS — I10 Essential (primary) hypertension: Secondary | ICD-10-CM

## 2019-04-27 DIAGNOSIS — Z7901 Long term (current) use of anticoagulants: Secondary | ICD-10-CM | POA: Diagnosis not present

## 2019-04-27 DIAGNOSIS — I4821 Permanent atrial fibrillation: Secondary | ICD-10-CM | POA: Diagnosis not present

## 2019-04-27 DIAGNOSIS — Z5181 Encounter for therapeutic drug level monitoring: Secondary | ICD-10-CM | POA: Diagnosis not present

## 2019-04-27 LAB — POCT INR: INR: 3.6 — AB (ref 2.0–3.0)

## 2019-04-27 MED ORDER — METOPROLOL TARTRATE 50 MG PO TABS: ORAL_TABLET | ORAL | 3 refills | Status: DC

## 2019-04-27 MED ORDER — WARFARIN SODIUM 5 MG PO TABS: ORAL_TABLET | ORAL | 0 refills | Status: DC

## 2019-04-27 NOTE — Patient Instructions (Signed)
Medication Instructions:  Your physician recommends that you continue on your current medications as directed. Please refer to the Current Medication list given to you today.  *If you need a refill on your cardiac medications before your next appointment, please call your pharmacy*  Lab Work: None ordered  If you have labs (blood work) drawn today and your tests are completely normal, you will receive your results only by: Marland Kitchen MyChart Message (if you have MyChart) OR . A paper copy in the mail If you have any lab test that is abnormal or we need to change your treatment, we will call you to review the results.  Testing/Procedures: None ordered  Follow-Up: At [facility], you and your health needs are our priority.  As part of our continuing mission to provide you with exceptional heart care, we have created designated Provider Care Teams.  These Care Teams include your primary Cardiologist (physician) and Advanced Practice Providers (APPs -  Physician Assistants and Nurse Practitioners) who all work together to provide you with the care you need, when you need it.  Your next appointment:   6 months  The format for your next appointment:   Either In Person or Virtual  Provider:   Melina Copa, PA-C or Ermalinda Barrios, PA-C  Other Instructions

## 2019-04-27 NOTE — Patient Instructions (Addendum)
Description   Do not take any Warfarin today then continue taking 1 tablet (5mg ) every day except 1/2 tablet (2.5mg ) on Sundays. Recheck INR in 4 weeks. Call if placed on any new medications [phone] or if you have any upcoming procedures.

## 2019-06-11 ENCOUNTER — Other Ambulatory Visit: Payer: Self-pay | Admitting: Interventional Cardiology

## 2019-06-11 DIAGNOSIS — I4821 Permanent atrial fibrillation: Secondary | ICD-10-CM

## 2019-06-11 NOTE — Telephone Encounter (Signed)
Pt is overdue for appt and has requested a Warfarin refill but has canceled her last appt. Called pt and she stated she is tired of being called to come to appts, advised her that we would not call if she would adhere to appts and she said so I see but I don't know if I can make it. Advised that she can set the date and we can see if we can fit her in and she did. Advised that I could only send in enough pills until that appt and she is aware. She stated she has 1 pill left for tonight and advised I will only be sending in 2 pills until her appt, which is set on Thursday. 2 pills sent and a note included for the pharmacy as they had requested 30 tabs.

## 2019-06-14 ENCOUNTER — Other Ambulatory Visit: Payer: Self-pay

## 2019-06-14 ENCOUNTER — Ambulatory Visit (INDEPENDENT_AMBULATORY_CARE_PROVIDER_SITE_OTHER): Payer: Medicare Other

## 2019-06-14 DIAGNOSIS — Z5181 Encounter for therapeutic drug level monitoring: Secondary | ICD-10-CM | POA: Diagnosis not present

## 2019-06-14 DIAGNOSIS — I4821 Permanent atrial fibrillation: Secondary | ICD-10-CM

## 2019-06-14 LAB — POCT INR: INR: 1.4 — AB (ref 2.0–3.0)

## 2019-06-14 MED ORDER — WARFARIN SODIUM 5 MG PO TABS: ORAL_TABLET | ORAL | 1 refills | Status: DC

## 2019-06-14 NOTE — Patient Instructions (Signed)
Description   Take 1.5 tablets today and tomorrow, then resume same dosage 1 tablet (5mg ) every day except 1/2 tablet (2.5mg ) on Sundays. Recheck INR in 4 weeks, per pt request. Call if placed on any new medications [phone] or if you have any upcoming procedures.

## 2019-08-08 ENCOUNTER — Ambulatory Visit (INDEPENDENT_AMBULATORY_CARE_PROVIDER_SITE_OTHER): Payer: Medicare Other | Admitting: *Deleted

## 2019-08-08 ENCOUNTER — Other Ambulatory Visit: Payer: Self-pay

## 2019-08-08 DIAGNOSIS — I4821 Permanent atrial fibrillation: Secondary | ICD-10-CM

## 2019-08-08 DIAGNOSIS — Z5181 Encounter for therapeutic drug level monitoring: Secondary | ICD-10-CM

## 2019-08-08 LAB — POCT INR: INR: 3.3 — AB (ref 2.0–3.0)

## 2019-08-08 NOTE — Patient Instructions (Signed)
Description   Hold this evening dose of Warfarin then continue taking 1 tablet (5mg ) every day except 1/2 tablet (2.5mg ) on Sundays. Recheck INR in 4 weeks, per pt request. Call if placed on any new medications [phone] or if you have any upcoming procedures.

## 2019-08-23 ENCOUNTER — Other Ambulatory Visit: Payer: Self-pay | Admitting: Interventional Cardiology

## 2019-08-23 DIAGNOSIS — I4821 Permanent atrial fibrillation: Secondary | ICD-10-CM

## 2019-09-26 ENCOUNTER — Other Ambulatory Visit: Payer: Self-pay | Admitting: Interventional Cardiology

## 2019-09-26 DIAGNOSIS — E039 Hypothyroidism, unspecified: Secondary | ICD-10-CM

## 2019-09-28 ENCOUNTER — Other Ambulatory Visit: Payer: Self-pay | Admitting: *Deleted

## 2019-09-28 DIAGNOSIS — E039 Hypothyroidism, unspecified: Secondary | ICD-10-CM

## 2019-09-28 MED ORDER — LEVOTHYROXINE SODIUM 88 MCG PO TABS: 88.0000 ug | ORAL_TABLET | Freq: Every day | ORAL | 1 refills | Status: DC

## 2019-10-15 NOTE — Progress Notes (Signed)
Cardiology Office Note    Date:  10/17/2019   ID:  Charlotte Castro, DOB October 11, 1932, MRN [id number]  PCP:  Isaac Bliss, Rayford Halsted, MD  Cardiologist: Larae Grooms, MD EPS: None  Chief Complaint  Patient presents with  . Follow-up    History of Present Illness:  Charlotte Castro is a 86 y.o. female with history of permanent Afib on coumadin, HTN, chronic shortness of breath and fatigue. Has stopped multiple meds in the past for various reasons.   Last saw Dr. Irish Lack 04/2019  Patient comes in for f/u. Having trouble with arthritis. Hasn't been vaccinated-afraid of the side effects. Very weak, trying to stay hydrated. Asking if she needs thyroid medicine.  Past Medical History:  Diagnosis Date  . Anemia    history of  . Anxiety   . Arthritis    "left knee" (04/05/2017)  . Atrial fibrillation ([place])   . Atrial flutter ([place])    typical appearing  . Atrial tachycardia ([place])    ablated 11/17/10  by JA  from [facility] of the aorta  . Chest pain on exertion   . CHF (congestive heart failure) ([place])   . Chronic lower back pain   . Chronic nausea   . Dizziness    chronic and of an unclear etiology  . Dyspnea   . Endometrial cancer (H[facility])    grade 1  . Gallstone pancreatitis   . Gastritis   . GERD (gastroesophageal reflux disease)   . History of blood transfusion ~ 1948  . History of hiatal hernia   . HTN (hypertension)   . Hyperlipemia   . Hypothyroidism   . Left leg numbness   . Obesity   . Osteoporosis   . PMB (postmenopausal bleeding)   . Sinus headache   . Toe ulcer ([place])    left 3rd toe  . Vitamin D deficiency     Past Surgical History:  Procedure Laterality Date  . ATRIAL ABLATION SURGERY  11/17/10   Atrial tachycardia arising from [facility] of the aorta ablated by JA  . BACK SURGERY    . CARDIAC CATHETERIZATION  01/11/2011   Archie Endo 01/12/2011  . CHOLECYSTECTOMY N/A 04/08/2017   Procedure: LAPAROSCOPIC CHOLECYSTECTOMY;  Surgeon: Georganna Skeans, MD;  Location:  [facility];  Service: General;  Laterality: N/A;  . FRACTURE SURGERY    . HYSTEROSCOPY WITH D & C N/A 08/05/2017   Procedure: DILATATION AND CURETTAGE /HYSTEROSCOPY AND POLYPECTOMY;  Surgeon: Osborne Oman, MD;  Location: [facility];  Service: Gynecology;  Laterality: N/A;  . LUMBAR SPINE SURGERY  ~ 1998  . ROBOTIC ASSISTED TOTAL HYSTERECTOMY WITH BILATERAL SALPINGO OOPHERECTOMY Bilateral 09/20/2017   Procedure: XI ROBOTIC ASSISTED TOTAL HYSTERECTOMY WITH BILATERAL SALPINGO OOPHORECTOMY, SENTINAL LYMPH NODE BIOPSY;  Surgeon: Everitt Amber, MD;  Location: [facility];  Service: Gynecology;  Laterality: Bilateral;  . SHOULDER SURGERY Right 1984 X2   Rollene Rotunda 01/19/2011  . WRIST FRACTURE SURGERY Left 1983    Current Medications: Current Meds  Medication Sig  . acetaminophen (TYLENOL) 325 MG tablet Take 650 mg by mouth daily.  Marland Kitchen levothyroxine (SYNTHROID) 88 MCG tablet Take 1 tablet (88 mcg total) by mouth daily before breakfast.  . metoprolol tartrate (LOPRESSOR) 50 MG tablet TAKE ONE TABLET TWICE DAILY WITH A MEAL  . vitamin B-12 (CYANOCOBALAMIN) 1000 MCG tablet Take 500 mcg by mouth 2 (two) times daily.  Marland Kitchen warfarin (COUMADIN) 5 MG tablet TAKE AS DIRECTED BY COUMADIN CLINIC     Allergies:  Iodinated diagnostic agents, Zosyn [piperacillin sod-tazobactam so], and Penicillins   Social History   Socioeconomic History  . Marital status: Widowed    Spouse name: Not on file  . Number of children: 2  . Years of education: 95  . Highest education level: Not on file  Occupational History  . Not on file  Tobacco Use  . Smoking status: Never Smoker  . Smokeless tobacco: Never Used  Substance and Sexual Activity  . Alcohol use: No  . Drug use: No  . Sexual activity: Never    Birth control/protection: Post-menopausal  Other Topics Concern  . Not on file  Social History Narrative   Lives alone in a one story home.  Has 2 children.  Retired.  Education: high school.     Social Determinants of Health    Financial Resource Strain:   . Difficulty of Paying Living Expenses:   Food Insecurity:   . Worried About Charity fundraiser in the Last Year:   . Arboriculturist in the Last Year:   Transportation Needs:   . Film/video editor (Medical):   Marland Kitchen Lack of Transportation (Non-Medical):   Physical Activity:   . Days of Exercise per Week:   . Minutes of Exercise per Session:   Stress:   . Feeling of Stress :   Social Connections:   . Frequency of Communication with Friends and Family:   . Frequency of Social Gatherings with Friends and Family:   . Attends Religious Services:   . Active Member of Clubs or Organizations:   . Attends Archivist Meetings:   Marland Kitchen Marital Status:      Family History:  The patient's   family history includes Heart attack in her father; Hypertension in her father.   ROS:   Please see the history of present illness.    ROS All other systems reviewed and are negative.   PHYSICAL EXAM:   VS:  BP 140/78   Pulse 95   Ht 5\' 8"  (1.727 m)   Wt 201 lb (91.2 kg)   SpO2 96%   BMI 30.56 kg/m   Physical Exam  GEN: Well nourished, well developed, in no acute distress  Neck: no JVD, carotid bruits, or masses Cardiac:RRR; 1/6 systolic murmur LSB  Respiratory:  clear to auscultation bilaterally, normal work of breathing GI: soft, nontender, nondistended, + BS Ext: brawny changes with varicosities Neuro:  Alert and Oriented x 3, Psych: euthymic mood, full affect  Wt Readings from Last 3 Encounters:  10/17/19 201 lb (91.2 kg)  04/27/19 192 lb (87.1 kg)  02/28/19 197 lb 3.2 oz (89.4 kg)      Studies/Labs Reviewed:   EKG:  EKG is not ordered today.   Recent Labs: 01/13/2019: TSH 5.280 01/30/2019: Magnesium 1.8 02/28/2019: ALT 11; BUN 20; Creatinine, Ser 0.81; Hemoglobin 14.2; Platelets 199.0; Potassium 4.1; Sodium 143   Lipid Panel No results found for: CHOL, TRIG, HDL, CHOLHDL, VLDL, LDLCALC, LDLDIRECT  Additional studies/ records that were  reviewed today include:  Cardiac catheterization 2012 very small mid to distal LAD otherwise normal coronary arteries    ASSESSMENT:    1. Permanent atrial fibrillation ([place])   2. Essential hypertension   3. Chronic dyspnea   4. Chronic fatigue   5. Hypothyroidism, unspecified type   6. Educated about COVID-19 virus infection      PLAN:  In order of problems listed above:  Permanent atrial fibrillation on Coumadin and metoprolol, HR regular today  Hypertension-controlled-watches salt  Chronic dyspnea  and fatigue-unchanged. Limited by arthritis  Hypothyroidism-asking about her meds. Will check TSH with surveillance labs  Educated on covid 19 vaccine    Medication Adjustments/Labs and Tests Ordered: Current medicines are reviewed at length with the patient today.  Concerns regarding medicines are outlined above.  Medication changes, Labs and Tests ordered today are listed in the Patient Instructions below. Patient Instructions  Medication Instructions:  Your physician recommends that you continue on your current medications as directed. Please refer to the Current Medication list given to you today.  *If you need a refill on your cardiac medications before your next appointment, please call your pharmacy*   Lab Work: TODAY: TSH, CMET, CBC  If you have labs (blood work) drawn today and your tests are completely normal, you will receive your results only by: Marland Kitchen MyChart Message (if you have MyChart) OR . A paper copy in the mail If you have any lab test that is abnormal or we need to change your treatment, we will call you to review the results.   Testing/Procedures: None ordered   Follow-Up: At [facility], you and your health needs are our priority.  As part of our continuing mission to provide you with exceptional heart care, we have created designated Provider Care Teams.  These Care Teams include your primary Cardiologist (physician) and Advanced Practice  Providers (APPs -  Physician Assistants and Nurse Practitioners) who all work together to provide you with the care you need, when you need it.  We recommend signing up for the patient portal called "MyChart".  Sign up information is provided on this After Visit Summary.  MyChart is used to connect with patients for Virtual Visits (Telemedicine).  Patients are able to view lab/test results, encounter notes, upcoming appointments, etc.  Non-urgent messages can be sent to your provider as well.   To learn more about what you can do with MyChart, go to [email or web address].    Your next appointment:   6 month(s)  The format for your next appointment:   In Person  Provider:   You may see Larae Grooms, MD or one of the following Advanced Practice Providers on your designated Care Team:    Melina Copa, PA-C  Ermalinda Barrios, PA-C    Other Instructions      Signed, Ermalinda Barrios, PA-C  10/17/2019 9:02 AM    [address], [place], [place]  [address] Phone: [phone]; Fax: [phone]

## 2019-10-17 ENCOUNTER — Ambulatory Visit (INDEPENDENT_AMBULATORY_CARE_PROVIDER_SITE_OTHER): Payer: Medicare Other | Admitting: Physician Assistant

## 2019-10-17 ENCOUNTER — Ambulatory Visit (INDEPENDENT_AMBULATORY_CARE_PROVIDER_SITE_OTHER): Payer: Medicare Other

## 2019-10-17 ENCOUNTER — Other Ambulatory Visit: Payer: Self-pay

## 2019-10-17 ENCOUNTER — Encounter: Payer: Self-pay | Admitting: Physician Assistant

## 2019-10-17 VITALS — BP 140/78 | HR 95 | Ht 68.0 in | Wt 201.0 lb

## 2019-10-17 DIAGNOSIS — E039 Hypothyroidism, unspecified: Secondary | ICD-10-CM

## 2019-10-17 DIAGNOSIS — Z5181 Encounter for therapeutic drug level monitoring: Secondary | ICD-10-CM

## 2019-10-17 DIAGNOSIS — R0609 Other forms of dyspnea: Secondary | ICD-10-CM | POA: Diagnosis not present

## 2019-10-17 DIAGNOSIS — I4821 Permanent atrial fibrillation: Secondary | ICD-10-CM | POA: Diagnosis not present

## 2019-10-17 DIAGNOSIS — R5382 Chronic fatigue, unspecified: Secondary | ICD-10-CM

## 2019-10-17 DIAGNOSIS — Z7189 Other specified counseling: Secondary | ICD-10-CM

## 2019-10-17 DIAGNOSIS — I10 Essential (primary) hypertension: Secondary | ICD-10-CM

## 2019-10-17 LAB — CBC
Hematocrit: 42.3 % (ref 34.0–46.6)
Hemoglobin: 14.8 g/dL (ref 11.1–15.9)
MCH: 33 pg (ref 26.6–33.0)
MCHC: 35 g/dL (ref 31.5–35.7)
MCV: 94 fL (ref 79–97)
Platelets: 187 10*3/uL (ref 150–450)
RBC: 4.48 x10E6/uL (ref 3.77–5.28)
RDW: 13.2 % (ref 11.7–15.4)
WBC: 6.9 10*3/uL (ref 3.4–10.8)

## 2019-10-17 LAB — COMPREHENSIVE METABOLIC PANEL
ALT: 12 IU/L (ref 0–32)
AST: 21 IU/L (ref 0–40)
Albumin/Globulin Ratio: 1.8 (ref 1.2–2.2)
Albumin: 4.3 g/dL (ref 3.6–4.6)
Alkaline Phosphatase: 87 IU/L (ref 39–117)
BUN/Creatinine Ratio: 20 (ref 12–28)
BUN: 18 mg/dL (ref 8–27)
Bilirubin Total: 0.7 mg/dL (ref 0.0–1.2)
CO2: 25 mmol/L (ref 20–29)
Calcium: 10 mg/dL (ref 8.7–10.3)
Chloride: 104 mmol/L (ref 96–106)
Creatinine, Ser: 0.91 mg/dL (ref 0.57–1.00)
GFR calc Af Amer: 67 mL/min/{1.73_m2} (ref >59)
GFR calc non Af Amer: 58 mL/min/{1.73_m2} — ABNORMAL LOW (ref >59)
Globulin, Total: 2.4 g/dL (ref 1.5–4.5)
Glucose: 102 mg/dL — ABNORMAL HIGH (ref 65–99)
Potassium: 5 mmol/L (ref 3.5–5.2)
Sodium: 143 mmol/L (ref 134–144)
Total Protein: 6.7 g/dL (ref 6.0–8.5)

## 2019-10-17 LAB — TSH: TSH: 3.2 u[IU]/mL (ref 0.450–4.500)

## 2019-10-17 LAB — POCT INR: INR: 1.8 — AB (ref 2.0–3.0)

## 2019-10-17 NOTE — Patient Instructions (Signed)
Description   Take 1.5 tablets today, then resume same dosage 1 tablet (5mg ) every day except 1/2 tablet (2.5mg ) on Sundays. Recheck INR in 4 weeks, per pt request. Call if placed on any new medications [phone] or if you have any upcoming procedures.

## 2019-10-17 NOTE — Patient Instructions (Signed)
Medication Instructions:  Your physician recommends that you continue on your current medications as directed. Please refer to the Current Medication list given to you today.  *If you need a refill on your cardiac medications before your next appointment, please call your pharmacy*   Lab Work: TODAY: TSH, CMET, CBC  If you have labs (blood work) drawn today and your tests are completely normal, you will receive your results only by: Marland Kitchen MyChart Message (if you have MyChart) OR . A paper copy in the mail If you have any lab test that is abnormal or we need to change your treatment, we will call you to review the results.   Testing/Procedures: None ordered   Follow-Up: At [facility], you and your health needs are our priority.  As part of our continuing mission to provide you with exceptional heart care, we have created designated Provider Care Teams.  These Care Teams include your primary Cardiologist (physician) and Advanced Practice Providers (APPs -  Physician Assistants and Nurse Practitioners) who all work together to provide you with the care you need, when you need it.  We recommend signing up for the patient portal called "MyChart".  Sign up information is provided on this After Visit Summary.  MyChart is used to connect with patients for Virtual Visits (Telemedicine).  Patients are able to view lab/test results, encounter notes, upcoming appointments, etc.  Non-urgent messages can be sent to your provider as well.   To learn more about what you can do with MyChart, go to [email or web address].    Your next appointment:   6 month(s)  The format for your next appointment:   In Person  Provider:   You may see Larae Grooms, MD or one of the following Advanced Practice Providers on your designated Care Team:    Melina Copa, PA-C  Ermalinda Barrios, PA-C    Other Instructions

## 2019-10-31 ENCOUNTER — Other Ambulatory Visit: Payer: Self-pay | Admitting: Interventional Cardiology

## 2019-10-31 DIAGNOSIS — I4821 Permanent atrial fibrillation: Secondary | ICD-10-CM

## 2019-11-14 ENCOUNTER — Other Ambulatory Visit: Payer: Self-pay

## 2019-11-14 ENCOUNTER — Ambulatory Visit (INDEPENDENT_AMBULATORY_CARE_PROVIDER_SITE_OTHER): Payer: Medicare Other

## 2019-11-14 DIAGNOSIS — Z5181 Encounter for therapeutic drug level monitoring: Secondary | ICD-10-CM | POA: Diagnosis not present

## 2019-11-14 DIAGNOSIS — I4821 Permanent atrial fibrillation: Secondary | ICD-10-CM

## 2019-11-14 LAB — POCT INR: INR: 1.3 — AB (ref 2.0–3.0)

## 2019-11-14 NOTE — Patient Instructions (Signed)
Description   Take 1.5 tablets today and tomorrow, then start taking 1 tablet (5mg ) daily.  Recheck INR in 1-2 weeks, pt states she is unable to come sooner than 3 weeks, aware of risks.  Call if placed on any new medications [phone] or if you have any upcoming procedures.

## 2019-12-05 ENCOUNTER — Ambulatory Visit: Payer: Medicare Other | Admitting: Physician Assistant

## 2019-12-05 ENCOUNTER — Other Ambulatory Visit: Payer: Self-pay

## 2019-12-05 ENCOUNTER — Ambulatory Visit (INDEPENDENT_AMBULATORY_CARE_PROVIDER_SITE_OTHER): Payer: Medicare Other

## 2019-12-05 DIAGNOSIS — I4821 Permanent atrial fibrillation: Secondary | ICD-10-CM

## 2019-12-05 DIAGNOSIS — Z5181 Encounter for therapeutic drug level monitoring: Secondary | ICD-10-CM | POA: Diagnosis not present

## 2019-12-05 LAB — POCT INR: INR: 4 — AB (ref 2.0–3.0)

## 2019-12-05 NOTE — Patient Instructions (Signed)
Hold Today(Wednesday) then continue taking 1 tablet (5mg ) daily.  Recheck INR in 3 weeks, pt states she is unable to come sooner than 3 weeks, aware of risks.  Call if placed on any new medications [phone] or if you have any upcoming procedures.

## 2019-12-26 ENCOUNTER — Ambulatory Visit (INDEPENDENT_AMBULATORY_CARE_PROVIDER_SITE_OTHER): Payer: Medicare Other | Admitting: Pharmacist

## 2019-12-26 ENCOUNTER — Other Ambulatory Visit: Payer: Self-pay

## 2019-12-26 DIAGNOSIS — I4821 Permanent atrial fibrillation: Secondary | ICD-10-CM

## 2019-12-26 DIAGNOSIS — Z5181 Encounter for therapeutic drug level monitoring: Secondary | ICD-10-CM | POA: Diagnosis not present

## 2019-12-26 LAB — POCT INR: INR: 3.3 — AB (ref 2.0–3.0)

## 2019-12-26 NOTE — Patient Instructions (Addendum)
Description   Skip your Coumadin today, then continue taking 1 tablet daily except 1/2 tablet on Sundays.  Recheck INR in 4 weeks.  Call if placed on any new medications [phone] or if you have any upcoming procedures.

## 2020-01-04 ENCOUNTER — Other Ambulatory Visit: Payer: Self-pay | Admitting: Interventional Cardiology

## 2020-01-04 ENCOUNTER — Other Ambulatory Visit: Payer: Self-pay | Admitting: Internal Medicine

## 2020-01-04 DIAGNOSIS — E039 Hypothyroidism, unspecified: Secondary | ICD-10-CM

## 2020-01-04 DIAGNOSIS — I4821 Permanent atrial fibrillation: Secondary | ICD-10-CM

## 2020-01-23 ENCOUNTER — Other Ambulatory Visit: Payer: Self-pay

## 2020-01-23 ENCOUNTER — Ambulatory Visit (INDEPENDENT_AMBULATORY_CARE_PROVIDER_SITE_OTHER): Payer: Medicare Other | Admitting: *Deleted

## 2020-01-23 DIAGNOSIS — I4821 Permanent atrial fibrillation: Secondary | ICD-10-CM

## 2020-01-23 DIAGNOSIS — Z5181 Encounter for therapeutic drug level monitoring: Secondary | ICD-10-CM

## 2020-01-23 LAB — POCT INR: INR: 3.1 — AB (ref 2.0–3.0)

## 2020-01-23 NOTE — Progress Notes (Signed)
Recommend pt come in 3 weeks to have INR checked. Pt requesting 4 weeks, advised of risk.

## 2020-01-23 NOTE — Patient Instructions (Signed)
Description   Start taking 1 tablet daily except for 1/2 a tablet on Sunday and Wednesday.  Recheck INR in 4 weeks- per pt request. Call if placed on any new medications [phone] or if you have any upcoming procedures.

## 2020-02-20 ENCOUNTER — Other Ambulatory Visit: Payer: Self-pay

## 2020-02-20 ENCOUNTER — Ambulatory Visit (INDEPENDENT_AMBULATORY_CARE_PROVIDER_SITE_OTHER): Payer: Medicare Other | Admitting: *Deleted

## 2020-02-20 DIAGNOSIS — I4821 Permanent atrial fibrillation: Secondary | ICD-10-CM | POA: Diagnosis not present

## 2020-02-20 DIAGNOSIS — Z5181 Encounter for therapeutic drug level monitoring: Secondary | ICD-10-CM

## 2020-02-20 LAB — POCT INR: INR: 3.4 — AB (ref 2.0–3.0)

## 2020-02-20 NOTE — Patient Instructions (Addendum)
Description   Do not take Warfarin today then start taking 1 tablet daily except for 1/2 tablet on Sunday, Wednesday, and Friday. Recheck INR in 4 weeks- per pt request. Call if placed on any new medications [phone] or if you have any upcoming procedures.

## 2020-03-07 ENCOUNTER — Other Ambulatory Visit: Payer: Self-pay | Admitting: Interventional Cardiology

## 2020-03-07 DIAGNOSIS — I4821 Permanent atrial fibrillation: Secondary | ICD-10-CM

## 2020-03-19 ENCOUNTER — Ambulatory Visit (INDEPENDENT_AMBULATORY_CARE_PROVIDER_SITE_OTHER): Payer: Medicare Other | Admitting: Pharmacist

## 2020-03-19 ENCOUNTER — Other Ambulatory Visit: Payer: Self-pay

## 2020-03-19 DIAGNOSIS — Z5181 Encounter for therapeutic drug level monitoring: Secondary | ICD-10-CM

## 2020-03-19 DIAGNOSIS — I4821 Permanent atrial fibrillation: Secondary | ICD-10-CM

## 2020-03-19 LAB — POCT INR: INR: 3.2 — AB (ref 2.0–3.0)

## 2020-03-19 NOTE — Patient Instructions (Signed)
Description   Begin taking 1 tablet daily except for 1/2 tablet on Sunday, Wednesday, Thursday and Friday. Recheck INR in 4 weeks- per pt request. Call if placed on any new medications [phone] or if you have any upcoming procedures.

## 2020-04-22 ENCOUNTER — Telehealth: Payer: Self-pay | Admitting: Pharmacist

## 2020-04-22 NOTE — Telephone Encounter (Signed)
Patient called and reports she knows she is overdue for INR check but is having work done on her house and has to be there during it.  Will call us back to schedule INR when work is completed.

## 2020-05-05 ENCOUNTER — Telehealth: Payer: Self-pay | Admitting: *Deleted

## 2020-05-05 NOTE — Telephone Encounter (Signed)
Called pt since he is overdue for Anticoagulation Clinic Appt; started to leave a message for the pt but she then picked up the phone. I was able to discuss the importance of having her INR checked and how she is at risk of having a stroke, clot, and bleeding if not having INR checked. She stated that the men are working on her house and they have broke windows and they had to reorder this and that, I asked if she could come in while they are awaiting the window order and she said honey I have to find my birth certificate for jury duty and to just wait until you have to find yours and all, advised that I have mine but we need to get her in the office to avoid any issues with her health and she stated well honey I just have a lot of issues. Then she stated maybe I can come next week but I will have to see what they will be doing to the house and I can't go to the grocery store and daughter is bringing groceries every two weeks and she has health problems. Advised that her health is very important and she should consider trying to set an appt to have her INR checked.Then she stated honey I will call you back but have a great weekend with the family.

## 2020-05-12 ENCOUNTER — Other Ambulatory Visit: Payer: Self-pay | Admitting: Interventional Cardiology

## 2020-05-12 DIAGNOSIS — I10 Essential (primary) hypertension: Secondary | ICD-10-CM

## 2020-05-12 DIAGNOSIS — I4821 Permanent atrial fibrillation: Secondary | ICD-10-CM

## 2020-05-13 ENCOUNTER — Ambulatory Visit (INDEPENDENT_AMBULATORY_CARE_PROVIDER_SITE_OTHER): Payer: Medicare Other | Admitting: *Deleted

## 2020-05-13 ENCOUNTER — Other Ambulatory Visit: Payer: Self-pay

## 2020-05-13 DIAGNOSIS — Z5181 Encounter for therapeutic drug level monitoring: Secondary | ICD-10-CM | POA: Diagnosis not present

## 2020-05-13 DIAGNOSIS — I4821 Permanent atrial fibrillation: Secondary | ICD-10-CM | POA: Diagnosis not present

## 2020-05-13 LAB — POCT INR: INR: 3.1 — AB (ref 2.0–3.0)

## 2020-05-13 NOTE — Patient Instructions (Signed)
Description   Start taking 1/2 tablet daily except 1 tablet Mondays and Fridays. Recheck INR in 4 weeks- per pt request. Call if placed on any new medications [phone] or if you have any upcoming procedures.

## 2020-05-21 ENCOUNTER — Encounter: Payer: Self-pay | Admitting: Internal Medicine

## 2020-05-21 ENCOUNTER — Ambulatory Visit (INDEPENDENT_AMBULATORY_CARE_PROVIDER_SITE_OTHER): Payer: Medicare Other | Admitting: Internal Medicine

## 2020-05-21 ENCOUNTER — Other Ambulatory Visit: Payer: Self-pay

## 2020-05-21 VITALS — BP 140/90 | HR 83 | Temp 97.5°F | Wt 198.0 lb

## 2020-05-21 DIAGNOSIS — N3001 Acute cystitis with hematuria: Secondary | ICD-10-CM

## 2020-05-21 DIAGNOSIS — R300 Dysuria: Secondary | ICD-10-CM

## 2020-05-21 LAB — POCT URINALYSIS DIPSTICK
Bilirubin, UA: NEGATIVE
Glucose, UA: NEGATIVE
Ketones, UA: NEGATIVE
Nitrite, UA: POSITIVE
Protein, UA: POSITIVE — AB
Spec Grav, UA: 1.02 (ref 1.010–1.025)
Urobilinogen, UA: 0.2 E.U./dL
pH, UA: 7 (ref 5.0–8.0)

## 2020-05-21 MED ORDER — SULFAMETHOXAZOLE-TRIMETHOPRIM 800-160 MG PO TABS: 1.0000 | ORAL_TABLET | Freq: Two times a day (BID) | ORAL | 0 refills | Status: AC

## 2020-05-21 NOTE — Progress Notes (Signed)
Acute office Visit     This visit occurred during the SARS-CoV-2 public health emergency.  Safety protocols were in place, including screening questions prior to the visit, additional usage of staff PPE, and extensive cleaning of exam room while observing appropriate contact time as indicated for disinfecting solutions.    CC/Reason for Visit: Left-sided pain  HPI: Charlotte Castro is a 86 y.o. female who is coming in today for the above mentioned reasons. Past Medical History is significant for: Endometrial cancer, chronic diastolic heart failure, hypertension, atrial fibrillation anticoagulated on warfarin and anxiety.  I have not seen her since September 2020 after hospitalization in August of that year for Klebsiella UTI with severe weakness and deconditioning that culminated in a 21-day stay at a local skilled nursing facility for rehab.  She states that for the past month but worsening over the past week, she has had left-sided flank pain that radiates towards her suprapubic area with significant dysuria and frequency.  "I wake up at all times of the night to urinate".  She denies nausea and vomiting.  She is upset that she got a jury duty summons for January.   Past Medical/Surgical History: Past Medical History:  Diagnosis Date  . Anemia    history of  . Anxiety   . Arthritis    "left knee" (04/05/2017)  . Atrial fibrillation ([place])   . Atrial flutter ([place])    typical appearing  . Atrial tachycardia ([place])    ablated 11/17/10  by JA  from the [facility] of the aorta  . Chest pain on exertion   . CHF (congestive heart failure) ([place])   . Chronic lower back pain   . Chronic nausea   . Dizziness    chronic and of an unclear etiology  . Dyspnea   . Endometrial cancer (HCC)    grade 1  . Gallstone pancreatitis   . Gastritis   . GERD (gastroesophageal reflux disease)   . History of blood transfusion ~ 1948  . History of hiatal hernia   . HTN (hypertension)   . Hyperlipemia   .  Hypothyroidism   . Left leg numbness   . Obesity   . Osteoporosis   . PMB (postmenopausal bleeding)   . Sinus headache   . Toe ulcer ([place])    left 3rd toe  . Vitamin D deficiency     Past Surgical History:  Procedure Laterality Date  . ATRIAL ABLATION SURGERY  11/17/10   Atrial tachycardia arising from [facility] of the aorta ablated by JA  . BACK SURGERY    . CARDIAC CATHETERIZATION  01/11/2011   Archie Endo 01/12/2011  . CHOLECYSTECTOMY N/A 04/08/2017   Procedure: LAPAROSCOPIC CHOLECYSTECTOMY;  Surgeon: Georganna Skeans, MD;  Location: [facility];  Service: General;  Laterality: N/A;  . FRACTURE SURGERY    . HYSTEROSCOPY WITH D & C N/A 08/05/2017   Procedure: DILATATION AND CURETTAGE /HYSTEROSCOPY AND POLYPECTOMY;  Surgeon: Osborne Oman, MD;  Location: [facility];  Service: Gynecology;  Laterality: N/A;  . LUMBAR SPINE SURGERY  ~ 1998  . ROBOTIC ASSISTED TOTAL HYSTERECTOMY WITH BILATERAL SALPINGO OOPHERECTOMY Bilateral 09/20/2017   Procedure: XI ROBOTIC ASSISTED TOTAL HYSTERECTOMY WITH BILATERAL SALPINGO OOPHORECTOMY, SENTINAL LYMPH NODE BIOPSY;  Surgeon: Everitt Amber, MD;  Location: [facility];  Service: Gynecology;  Laterality: Bilateral;  . SHOULDER SURGERY Right 1984 X2   Rollene Rotunda 01/19/2011  . WRIST FRACTURE SURGERY Left 1983    Social History:  reports that she has never  smoked. She has never used smokeless tobacco. She reports that she does not drink alcohol and does not use drugs.  Allergies: Allergies  Allergen Reactions  . Iodinated Diagnostic Agents Shortness Of Breath and Other (See Comments)    "Allergic," per [facility]- Causes headaches, also  . Zosyn [Piperacillin Sod-Tazobactam So] Other (See Comments)    "Allergic," per [facility]  . Penicillins Other (See Comments)    Tolerated Zosyn Oct 2018, but "Allergic," per [facility] Did it involve swelling of the face/tongue/throat, SOB, or low BP? Unk Did it involve sudden or severe rash/hives, skin peeling, or any reaction on the inside of your mouth or nose?  Unk Did you need to seek medical attention at a hospital or doctor's office? Unk When did it last happen? Unk If all above answers are "NO", may proceed with cephalosporin use.     Family History:  Family History  Problem Relation Age of Onset  . Heart attack Father   . Hypertension Father      Current Outpatient Medications:  .  acetaminophen (TYLENOL) 325 MG tablet, Take 650 mg by mouth daily., Disp: , Rfl:  .  levothyroxine (SYNTHROID) 88 MCG tablet, TAKE ONE TABLET DAILY BEFORE BREAKFAST, Disp: 90 tablet, Rfl: 1 .  metoprolol tartrate (LOPRESSOR) 50 MG tablet, TAKE ONE TABLET TWICE DAILY WITH A MEAL, Disp: 180 tablet, Rfl: 1 .  vitamin B-12 (CYANOCOBALAMIN) 1000 MCG tablet, Take 500 mcg by mouth 2 (two) times daily., Disp: , Rfl:  .  warfarin (COUMADIN) 5 MG tablet, TAKE AS DIRECTED BY COUMADIN CLINIC, Disp: 35 tablet, Rfl: 1 .  sulfamethoxazole-trimethoprim (BACTRIM DS) 800-160 MG tablet, Take 1 tablet by mouth 2 (two) times daily for 7 days., Disp: 14 tablet, Rfl: 0  Review of Systems:  Constitutional: Denies fever, chills, diaphoresis, appetite change. HEENT: Denies photophobia, eye pain, redness, hearing loss, ear pain, congestion, sore throat, rhinorrhea, sneezing, mouth sores, trouble swallowing, neck pain, neck stiffness and tinnitus.   Respiratory: Denies  cough, chest tightness,  and wheezing.   Cardiovascular: Denies chest pain, palpitations and leg swelling.  Gastrointestinal: Denies nausea, vomiting, abdominal pain, diarrhea, constipation, blood in stool and abdominal distention.  Genitourinary: Denies dysuria, urgency, frequency, hematuria, flank pain and difficulty urinating.  Endocrine: Denies: hot or cold intolerance, sweats, changes in hair or nails, polyuria, polydipsia. Musculoskeletal: Denies myalgias, back pain, joint swelling, arthralgias and gait problem.  Skin: Denies pallor, rash and wound.  Neurological: Denies dizziness, seizures, syncope, weakness,  light-headedness, numbness and headaches.  Hematological: Denies adenopathy. Easy bruising, personal or family bleeding history  Psychiatric/Behavioral: Denies suicidal ideation, mood changes, confusion, nervousness, sleep disturbance and agitation    Physical Exam: Vitals:   05/21/20 1001  BP: 140/90  Pulse: 83  Temp: (!) 97.5 F (36.4 C)  TempSrc: Oral  SpO2: 99%  Weight: 198 lb (89.8 kg)    Body mass index is 30.11 kg/m.  Constitutional: NAD, calm, comfortable Eyes: PERRL, lids and conjunctivae normal ENMT: Mucous membranes are moist. Respiratory: clear to auscultation bilaterally, no wheezing, no crackles. Normal respiratory effort. No accessory muscle use.  Cardiovascular: Regular rate and rhythm, no murmurs / rubs / gallops. No extremity edema.    Psychiatric: Normal judgment and insight. Alert and oriented x 3. Normal mood.    Impression and Plan:  Acute cystitis with hematuria -Urine dipstick confirms UTI with positive nitrates, leukocytes, blood and protein. -I will start her on Bactrim for 7 days, sent for UA and culture. -I will write her a letter  for jury duty excusal due to her medical comorbidities.   Patient Instructions  -Nice seeing you today!!  -You have a urine infection that is causing your left-sided pain.  Start bactrim DS 1 tablet twice daily for 7 days.  -Schedule follow up in 3 months for your physical. Please come in fasting that day.   Urinary Tract Infection, Adult A urinary tract infection (UTI) is an infection of any part of the urinary tract. The urinary tract includes:  The kidneys.  The ureters.  The bladder.  The urethra. These organs make, store, and get rid of pee (urine) in the body. What are the causes? This is caused by germs (bacteria) in your genital area. These germs grow and cause swelling (inflammation) of your urinary tract. What increases the risk? You are more likely to develop this condition if:  You have a  small, thin tube (catheter) to drain pee.  You cannot control when you pee or poop (incontinence).  You are female, and: ? You use these methods to prevent pregnancy:  A medicine that kills sperm (spermicide).  A device that blocks sperm (diaphragm). ? You have low levels of a female hormone (estrogen). ? You are pregnant.  You have genes that add to your risk.  You are sexually active.  You take antibiotic medicines.  You have trouble peeing because of: ? A prostate that is bigger than normal, if you are female. ? A blockage in the part of your body that drains pee from the bladder (urethra). ? A kidney stone. ? A nerve condition that affects your bladder (neurogenic bladder). ? Not getting enough to drink. ? Not peeing often enough.  You have other conditions, such as: ? Diabetes. ? A weak disease-fighting system (immune system). ? Sickle cell disease. ? Gout. ? Injury of the spine. What are the signs or symptoms? Symptoms of this condition include:  Needing to pee right away (urgently).  Peeing often.  Peeing small amounts often.  Pain or burning when peeing.  Blood in the pee.  Pee that smells bad or not like normal.  Trouble peeing.  Pee that is cloudy.  Fluid coming from the vagina, if you are female.  Pain in the belly or lower back. Other symptoms include:  Throwing up (vomiting).  No urge to eat.  Feeling mixed up (confused).  Being tired and grouchy (irritable).  A fever.  Watery poop (diarrhea). How is this treated? This condition may be treated with:  Antibiotic medicine.  Other medicines.  Drinking enough water. Follow these instructions at home:  Medicines  Take over-the-counter and prescription medicines only as told by your doctor.  If you were prescribed an antibiotic medicine, take it as told by your doctor. Do not stop taking it even if you start to feel better. General instructions  Make sure you: ? Pee until your  bladder is empty. ? Do not hold pee for a long time. ? Empty your bladder after sex. ? Wipe from front to back after pooping if you are a female. Use each tissue one time when you wipe.  Drink enough fluid to keep your pee pale yellow.  Keep all follow-up visits as told by your doctor. This is important. Contact a doctor if:  You do not get better after 1-2 days.  Your symptoms go away and then come back. Get help right away if:  You have very bad back pain.  You have very bad pain in your lower belly.  You have a fever.  You are sick to your stomach (nauseous).  You are throwing up. Summary  A urinary tract infection (UTI) is an infection of any part of the urinary tract.  This condition is caused by germs in your genital area.  There are many risk factors for a UTI. These include having a small, thin tube to drain pee and not being able to control when you pee or poop.  Treatment includes antibiotic medicines for germs.  Drink enough fluid to keep your pee pale yellow. This information is not intended to replace advice given to you by your health care provider. Make sure you discuss any questions you have with your health care provider. Document Revised: 05/18/2018 Document Reviewed: 12/08/2017 Elsevier Patient Education  2020 [place], MD [facility]

## 2020-05-21 NOTE — Patient Instructions (Signed)
-Nice seeing you today!!  -You have a urine infection that is causing your left-sided pain.  Start bactrim DS 1 tablet twice daily for 7 days.  -Schedule follow up in 3 months for your physical. Please come in fasting that day.   Urinary Tract Infection, Adult A urinary tract infection (UTI) is an infection of any part of the urinary tract. The urinary tract includes:  The kidneys.  The ureters.  The bladder.  The urethra. These organs make, store, and get rid of pee (urine) in the body. What are the causes? This is caused by germs (bacteria) in your genital area. These germs grow and cause swelling (inflammation) of your urinary tract. What increases the risk? You are more likely to develop this condition if:  You have a small, thin tube (catheter) to drain pee.  You cannot control when you pee or poop (incontinence).  You are female, and: ? You use these methods to prevent pregnancy:  A medicine that kills sperm (spermicide).  A device that blocks sperm (diaphragm). ? You have low levels of a female hormone (estrogen). ? You are pregnant.  You have genes that add to your risk.  You are sexually active.  You take antibiotic medicines.  You have trouble peeing because of: ? A prostate that is bigger than normal, if you are female. ? A blockage in the part of your body that drains pee from the bladder (urethra). ? A kidney stone. ? A nerve condition that affects your bladder (neurogenic bladder). ? Not getting enough to drink. ? Not peeing often enough.  You have other conditions, such as: ? Diabetes. ? A weak disease-fighting system (immune system). ? Sickle cell disease. ? Gout. ? Injury of the spine. What are the signs or symptoms? Symptoms of this condition include:  Needing to pee right away (urgently).  Peeing often.  Peeing small amounts often.  Pain or burning when peeing.  Blood in the pee.  Pee that smells bad or not like normal.  Trouble  peeing.  Pee that is cloudy.  Fluid coming from the vagina, if you are female.  Pain in the belly or lower back. Other symptoms include:  Throwing up (vomiting).  No urge to eat.  Feeling mixed up (confused).  Being tired and grouchy (irritable).  A fever.  Watery poop (diarrhea). How is this treated? This condition may be treated with:  Antibiotic medicine.  Other medicines.  Drinking enough water. Follow these instructions at home:  Medicines  Take over-the-counter and prescription medicines only as told by your doctor.  If you were prescribed an antibiotic medicine, take it as told by your doctor. Do not stop taking it even if you start to feel better. General instructions  Make sure you: ? Pee until your bladder is empty. ? Do not hold pee for a long time. ? Empty your bladder after sex. ? Wipe from front to back after pooping if you are a female. Use each tissue one time when you wipe.  Drink enough fluid to keep your pee pale yellow.  Keep all follow-up visits as told by your doctor. This is important. Contact a doctor if:  You do not get better after 1-2 days.  Your symptoms go away and then come back. Get help right away if:  You have very bad back pain.  You have very bad pain in your lower belly.  You have a fever.  You are sick to your stomach (nauseous).  You are  throwing up. Summary  A urinary tract infection (UTI) is an infection of any part of the urinary tract.  This condition is caused by germs in your genital area.  There are many risk factors for a UTI. These include having a small, thin tube to drain pee and not being able to control when you pee or poop.  Treatment includes antibiotic medicines for germs.  Drink enough fluid to keep your pee pale yellow. This information is not intended to replace advice given to you by your health care provider. Make sure you discuss any questions you have with your health care  provider. Document Revised: 05/18/2018 Document Reviewed: 12/08/2017 Elsevier Patient Education  2020 [employer].

## 2020-05-22 LAB — URINALYSIS
Bilirubin Urine: NEGATIVE
Glucose, UA: NEGATIVE
Ketones, ur: NEGATIVE
Nitrite: POSITIVE — AB
Specific Gravity, Urine: 1.016 (ref 1.001–1.03)
pH: >=8.5 — AB (ref 5.0–8.0)

## 2020-05-22 LAB — URINE CULTURE
MICRO NUMBER:: 11292424
SPECIMEN QUALITY:: ADEQUATE

## 2020-06-11 ENCOUNTER — Other Ambulatory Visit: Payer: Self-pay

## 2020-06-11 ENCOUNTER — Ambulatory Visit (INDEPENDENT_AMBULATORY_CARE_PROVIDER_SITE_OTHER): Payer: Medicare Other

## 2020-06-11 DIAGNOSIS — I4821 Permanent atrial fibrillation: Secondary | ICD-10-CM

## 2020-06-11 DIAGNOSIS — Z5181 Encounter for therapeutic drug level monitoring: Secondary | ICD-10-CM | POA: Diagnosis not present

## 2020-06-11 LAB — POCT INR: INR: 2.4 (ref 2.0–3.0)

## 2020-06-11 NOTE — Patient Instructions (Signed)
Description   Continue on same dosage 1/2 tablet daily except 1 tablet Mondays and Fridays. Recheck INR in 4 weeks. Call if placed on any new medications [phone] or if you have any upcoming procedures.

## 2020-06-18 ENCOUNTER — Other Ambulatory Visit: Payer: Self-pay | Admitting: Interventional Cardiology

## 2020-06-18 DIAGNOSIS — I4821 Permanent atrial fibrillation: Secondary | ICD-10-CM

## 2020-06-25 ENCOUNTER — Telehealth: Payer: Self-pay | Admitting: Internal Medicine

## 2020-06-25 ENCOUNTER — Telehealth (INDEPENDENT_AMBULATORY_CARE_PROVIDER_SITE_OTHER): Payer: Medicare Other | Admitting: Internal Medicine

## 2020-06-25 ENCOUNTER — Other Ambulatory Visit: Payer: Self-pay

## 2020-06-25 DIAGNOSIS — R350 Frequency of micturition: Secondary | ICD-10-CM | POA: Diagnosis not present

## 2020-06-25 DIAGNOSIS — M545 Low back pain, unspecified: Secondary | ICD-10-CM

## 2020-06-25 DIAGNOSIS — R300 Dysuria: Secondary | ICD-10-CM

## 2020-06-25 NOTE — Telephone Encounter (Signed)
She will need to drop off a urine sample. I would also suggest a VV. You can schedule for today.

## 2020-06-25 NOTE — Telephone Encounter (Signed)
Spoke with daughter.  Telephone visit scheduled.  Daughter or son will drop of urine sample.

## 2020-06-25 NOTE — Telephone Encounter (Signed)
Patient daughter Jones Broom) is calling and stated that she was seen for a bladder infection that has came back and wanted to see if the provider can send a prescription back to the pharmacy, please advise. CB is [phone]

## 2020-06-25 NOTE — Progress Notes (Signed)
.   Virtual Visit via Telephone Note  I connected with [name] on 06/25/20 at  4:30 PM EST by telephone and verified that I am speaking with the correct person using two identifiers.   I discussed the limitations, risks, security and privacy concerns of performing an evaluation and management service by telephone and the availability of in person appointments. I also discussed with the patient that there may be a patient responsible charge related to this service. The patient expressed understanding and agreed to proceed.  Location patient: home Location provider: work office Participants present for the call: patient, provider Patient did not have a visit in the prior 7 days to address this/these issue(s).   History of Present Illness:  She has scheduled this visit to discuss what she believes is another UTI.  She was treated for urinary tract infection beginning of December.  She states that her symptoms improved but then recurred again about a week ago.  She has been experiencing urinary frequency up to 10 times a night as well as some back pain that she states radiates down her leg.  She cannot give me a lot of specifics about the pain.  She denies fevers, abdominal pain, fever, chills.   Observations/Objective: Patient sounds cheerful and well on the phone. I do not appreciate any increased work of breathing. Speech and thought processing are grossly intact. Patient reported vitals: None reported   Current Outpatient Medications:  .  acetaminophen (TYLENOL) 325 MG tablet, Take 650 mg by mouth daily., Disp: , Rfl:  .  levothyroxine (SYNTHROID) 88 MCG tablet, TAKE ONE TABLET DAILY BEFORE BREAKFAST, Disp: 90 tablet, Rfl: 1 .  metoprolol tartrate (LOPRESSOR) 50 MG tablet, TAKE ONE TABLET TWICE DAILY WITH A MEAL, Disp: 180 tablet, Rfl: 1 .  vitamin B-12 (CYANOCOBALAMIN) 1000 MCG tablet, Take 500 mcg by mouth 2 (two) times daily., Disp: , Rfl:  .  warfarin (COUMADIN) 5 MG tablet,  TAKE AS DIRECTED BY COUMADIN CLINIC, Disp: 25 tablet, Rfl: 1  Review of Systems:  Constitutional: Denies fever, chills, diaphoresis, appetite change and fatigue.  HEENT: Denies photophobia, eye pain, redness, hearing loss, ear pain, congestion, sore throat, rhinorrhea, sneezing, mouth sores, trouble swallowing, neck pain, neck stiffness and tinnitus.   Respiratory: Denies SOB, DOE, cough, chest tightness,  and wheezing.   Cardiovascular: Denies chest pain, palpitations and leg swelling.  Gastrointestinal: Denies nausea, vomiting, abdominal pain, diarrhea, constipation, blood in stool and abdominal distention.  Genitourinary: Denies dysuria, urgency,  hematuria and difficulty urinating.  Endocrine: Denies: hot or cold intolerance, sweats, changes in hair or nails, polyuria, polydipsia. Musculoskeletal: Denies myalgias,  joint swelling, arthralgias and gait problem.  Skin: Denies pallor, rash and wound.  Neurological: Denies dizziness, seizures, syncope, weakness, light-headedness, numbness and headaches.  Hematological: Denies adenopathy. Easy bruising, personal or family bleeding history  Psychiatric/Behavioral: Denies suicidal ideation, mood changes, confusion, nervousness, sleep disturbance and agitation   Assessment and Plan:  Urine frequency Acute left-sided low back pain, unspecified whether sciatica present  -She will drop off a urine sample, I am hesitant to prescribe antibiotics before we obtain a urine culture. -She will bring urine sample to [place] tomorrow. -Her back pain radiating down the backside of her leg sounds more like sciatica to me.  I have advised her that if her urine culture is negative then I would suggest an in person evaluation for this, she agrees.    I discussed the assessment and treatment plan with the patient. The patient  was provided an opportunity to ask questions and all were answered. The patient agreed with the plan and demonstrated an understanding of  the instructions.   The patient was advised to call back or seek an in-person evaluation if the symptoms worsen or if the condition fails to improve as anticipated.  I provided 12 minutes of non-face-to-face time during this encounter.   Lelon Frohlich, MD [facility] at [facility]

## 2020-07-03 ENCOUNTER — Telehealth: Payer: Self-pay | Admitting: Internal Medicine

## 2020-07-03 ENCOUNTER — Other Ambulatory Visit (INDEPENDENT_AMBULATORY_CARE_PROVIDER_SITE_OTHER): Payer: Medicare Other

## 2020-07-03 ENCOUNTER — Other Ambulatory Visit: Payer: Self-pay

## 2020-07-03 ENCOUNTER — Other Ambulatory Visit: Payer: Medicare Other

## 2020-07-03 DIAGNOSIS — R300 Dysuria: Secondary | ICD-10-CM

## 2020-07-03 LAB — URINALYSIS, ROUTINE W REFLEX MICROSCOPIC
Bilirubin Urine: NEGATIVE
Ketones, ur: NEGATIVE
Nitrite: POSITIVE — AB
Specific Gravity, Urine: >=1.03 — AB (ref 1.000–1.030)
Total Protein, Urine: NEGATIVE
Urine Glucose: NEGATIVE
Urobilinogen, UA: 0.2 (ref 0.0–1.0)
pH: 6 (ref 5.0–8.0)

## 2020-07-03 NOTE — Telephone Encounter (Signed)
pts daughter is calling to see if she could get the results of the urine that was dropped off today.  She is aware that the pt would get a call when the results are in.

## 2020-07-03 NOTE — Telephone Encounter (Signed)
UA and culture sent to [facility] lab

## 2020-07-04 ENCOUNTER — Other Ambulatory Visit: Payer: Self-pay | Admitting: Internal Medicine

## 2020-07-04 DIAGNOSIS — R300 Dysuria: Secondary | ICD-10-CM

## 2020-07-04 LAB — URINE CULTURE
MICRO NUMBER:: 11438573
SPECIMEN QUALITY:: ADEQUATE

## 2020-07-04 MED ORDER — SULFAMETHOXAZOLE-TRIMETHOPRIM 800-160 MG PO TABS: 1.0000 | ORAL_TABLET | Freq: Two times a day (BID) | ORAL | 0 refills | Status: AC

## 2020-07-04 NOTE — Telephone Encounter (Signed)
Spoke with patient and reviewed lab results. 

## 2020-07-07 ENCOUNTER — Telehealth: Payer: Self-pay | Admitting: *Deleted

## 2020-07-07 NOTE — Telephone Encounter (Addendum)
Pt called and stated that she needed to cancel her appointment on 1/26. Pt stated she was not feeling well and the Dr. was going to put her on a medication to help with her Kidney infection, she stated she did not know the name of the medication. Pt stated she has been on the medication before. It looks like she was started on Bactrim on 1/21 to 1/26. Informed pt that that medicine can increase her INR and makes her at a higher risk for bleeding and she needs to keep her appointment for 1/26. Pt stated she can not keep the appointment for 1/26 and does not want to rescheduled at this time.  Informed pt that if she notices any bleeding then she needs to seek medical attention and encouraged her to eat a extra serving of greens. Pt stated that she would call back to reschedule her coumadin appointment.

## 2020-07-10 ENCOUNTER — Telehealth: Payer: Self-pay | Admitting: *Deleted

## 2020-07-10 NOTE — Telephone Encounter (Signed)
Spoke to pt earlier this week because she needed to cancel her appointment to have her INR checked, she said she has an infection in her kidneys and does not feel well.  Informed pt that she really should have her INR checked because per records it looks like Dr. Jerilee Hoh started her on bactrim and that could increase her INR and make her at a higher risk for bleeding.  Pt stated that she did not know the name of the medicine that Dr Jerilee Hoh put her on. Called and spoke to pt again today to see how she was feeling and reminding her it was important for her to have her INR checked. Pt stated she still did not feel well and could not come in for an appointment. Asked pt if I could call her daughter and so to see if they could bring her, she said no. Reminded pt that the antibiotic(per records)  that she is on could increase her INR and to schedule an appointment as soon as possible. Also, reminded the pt to seek medical attention if she notices any bleeding.

## 2020-07-15 ENCOUNTER — Telehealth: Payer: Self-pay | Admitting: *Deleted

## 2020-07-15 NOTE — Telephone Encounter (Signed)
Called pt and LMOM for pt to call coumadin clinic back to schedule an appointment to have her INR checked.

## 2020-07-23 ENCOUNTER — Ambulatory Visit (INDEPENDENT_AMBULATORY_CARE_PROVIDER_SITE_OTHER): Payer: Medicare Other | Admitting: *Deleted

## 2020-07-23 ENCOUNTER — Other Ambulatory Visit: Payer: Self-pay

## 2020-07-23 DIAGNOSIS — Z5181 Encounter for therapeutic drug level monitoring: Secondary | ICD-10-CM | POA: Diagnosis not present

## 2020-07-23 DIAGNOSIS — I4821 Permanent atrial fibrillation: Secondary | ICD-10-CM | POA: Diagnosis not present

## 2020-07-23 LAB — POCT INR: INR: 1.6 — AB (ref 2.0–3.0)

## 2020-07-23 NOTE — Patient Instructions (Signed)
Description   Today take 1 tablet then continue on same dosage 1/2 tablet daily except 1 tablet Mondays and Fridays. Recheck INR in 3 weeks. Call if placed on any new medications [phone] or if you have any upcoming procedures.

## 2020-07-31 ENCOUNTER — Telehealth: Payer: Self-pay | Admitting: Internal Medicine

## 2020-07-31 NOTE — Telephone Encounter (Signed)
Spoke with patient and a telephone visit is scheduled.

## 2020-07-31 NOTE — Telephone Encounter (Signed)
Daughter states she isn't any better, she is actually worse.  She has completed medicine but feels like she can't do anything.  Daughter isn't on her DPR.  Daughter wants a call--made aware the call would go back to the patient.

## 2020-07-31 NOTE — Telephone Encounter (Signed)
Left message on machine for patient to return our call 

## 2020-08-05 ENCOUNTER — Telehealth: Payer: Self-pay | Admitting: Internal Medicine

## 2020-08-05 ENCOUNTER — Other Ambulatory Visit: Payer: Self-pay

## 2020-08-05 ENCOUNTER — Telehealth: Payer: Medicare Other | Admitting: Internal Medicine

## 2020-08-05 ENCOUNTER — Encounter: Payer: Self-pay | Admitting: Internal Medicine

## 2020-08-05 VITALS — Wt 187.0 lb

## 2020-08-05 DIAGNOSIS — M79604 Pain in right leg: Secondary | ICD-10-CM

## 2020-08-05 DIAGNOSIS — M79605 Pain in left leg: Secondary | ICD-10-CM

## 2020-08-05 NOTE — Progress Notes (Signed)
This is a no charge visit. Patient will come in office tomorrow for evaluation of same issue.  Domingo Mend, MD [facility] at [facility]

## 2020-08-07 ENCOUNTER — Ambulatory Visit
Admission: RE | Admit: 2020-08-07 | Discharge: 2020-08-07 | Disposition: A | Discharge status: Home / Self Care | Payer: Medicare Other | Source: Ambulatory Visit | Attending: Internal Medicine | Admitting: Internal Medicine

## 2020-08-07 ENCOUNTER — Other Ambulatory Visit: Payer: Self-pay

## 2020-08-07 ENCOUNTER — Ambulatory Visit (INDEPENDENT_AMBULATORY_CARE_PROVIDER_SITE_OTHER): Payer: Medicare Other | Admitting: Internal Medicine

## 2020-08-07 ENCOUNTER — Encounter: Payer: Self-pay | Admitting: Internal Medicine

## 2020-08-07 ENCOUNTER — Other Ambulatory Visit: Payer: Medicare Other

## 2020-08-07 VITALS — BP 150/90 | HR 71 | Temp 97.8°F | Wt 200.8 lb

## 2020-08-07 DIAGNOSIS — N12 Tubulo-interstitial nephritis, not specified as acute or chronic: Secondary | ICD-10-CM

## 2020-08-07 DIAGNOSIS — R1012 Left upper quadrant pain: Secondary | ICD-10-CM | POA: Diagnosis not present

## 2020-08-07 LAB — URINALYSIS, ROUTINE W REFLEX MICROSCOPIC
Bilirubin Urine: NEGATIVE
Ketones, ur: NEGATIVE
Nitrite: POSITIVE — AB
Specific Gravity, Urine: >=1.03 — AB (ref 1.000–1.030)
Total Protein, Urine: NEGATIVE
Urine Glucose: NEGATIVE
Urobilinogen, UA: 0.2 (ref 0.0–1.0)
pH: 6 (ref 5.0–8.0)

## 2020-08-07 LAB — POCT URINALYSIS DIPSTICK
Bilirubin, UA: NEGATIVE
Glucose, UA: NEGATIVE
Ketones, UA: NEGATIVE
Protein, UA: POSITIVE — AB
Spec Grav, UA: 1.025 (ref 1.010–1.025)
Urobilinogen, UA: 0.2 E.U./dL
pH, UA: 6 (ref 5.0–8.0)

## 2020-08-07 IMAGING — CT CT ABD-PELV W/O CM
2 of 3 series · 12 of 32 positions shown, 18 images · non-contrast
Comparison: None.

CLINICAL DATA: Left flank pain x3 months stone disease suspected.

EXAM:
CT ABDOMEN AND PELVIS WITHOUT CONTRAST
TECHNIQUE: Multidetector CT imaging of the abdomen and pelvis was performed
following the standard protocol without IV contrast.

[Series 2: renal standard/full · axial · 0.92mm/px · z∈[-391,-26]mm · 9 of 93 slices shown, 15 images (1 of 2)]
[im 10/93  soft-tissue]
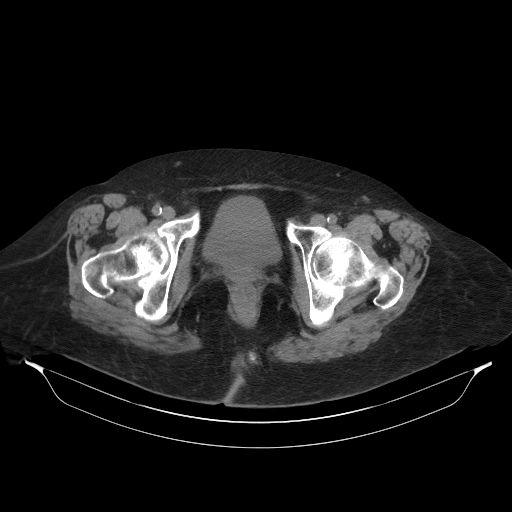
[im 10/93  bone]
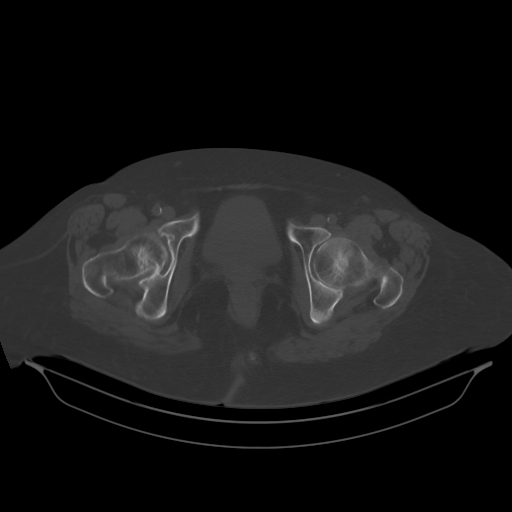
[im 19/93  soft-tissue]
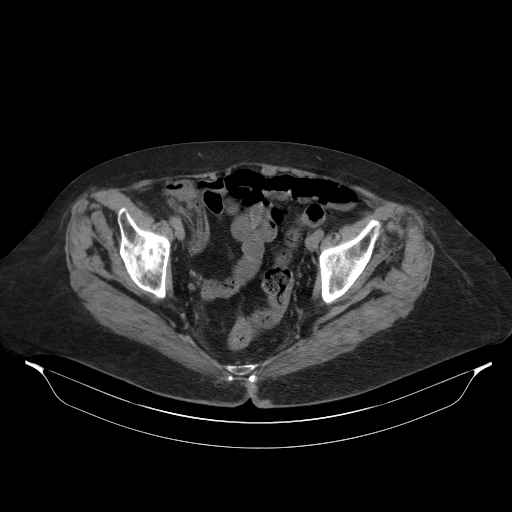
[im 28/93  soft-tissue]
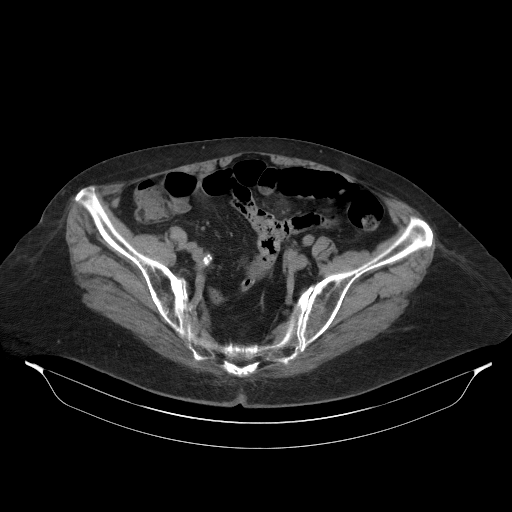
[im 37/93  soft-tissue]
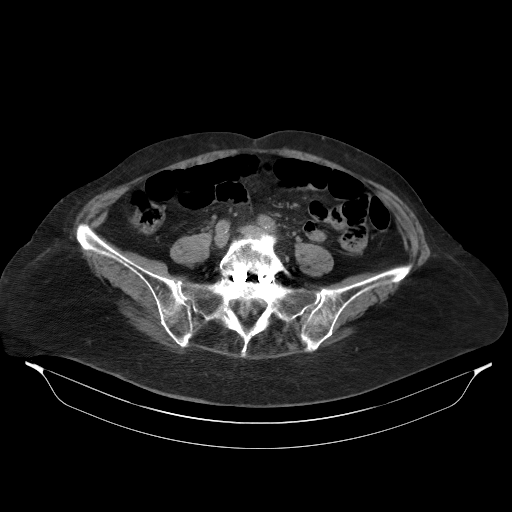
[im 47/93  soft-tissue]
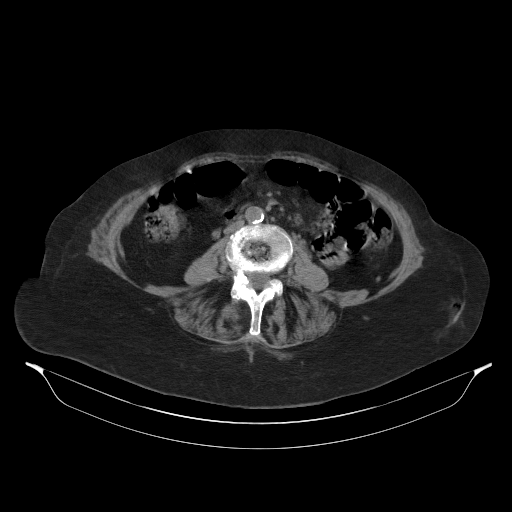
[im 56/93  soft-tissue]
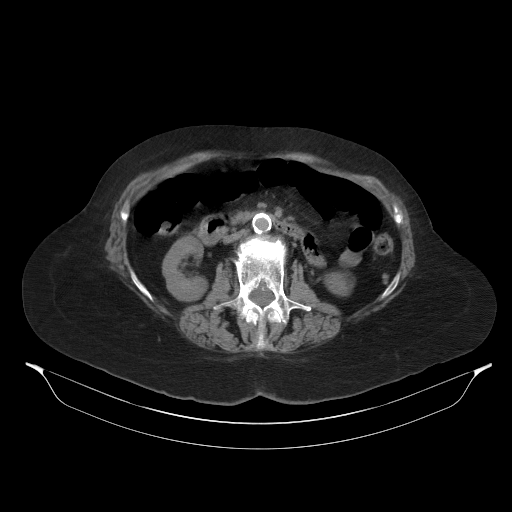
[im 56/93  lung]
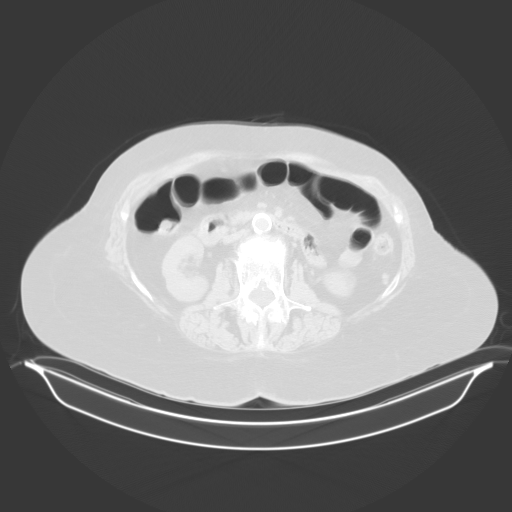
[im 65/93  soft-tissue]
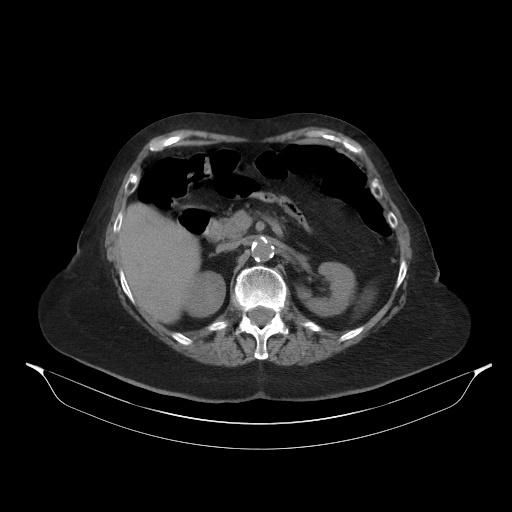
[im 65/93  lung]
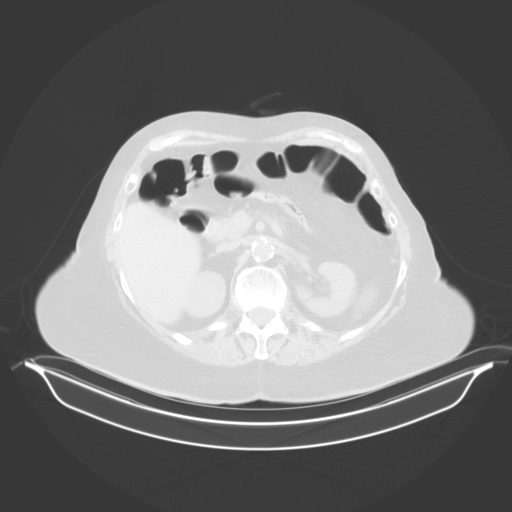
[im 74/93  soft-tissue]
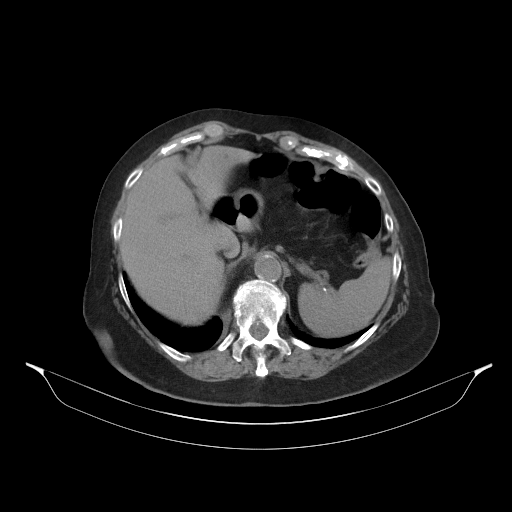
[im 74/93  lung]
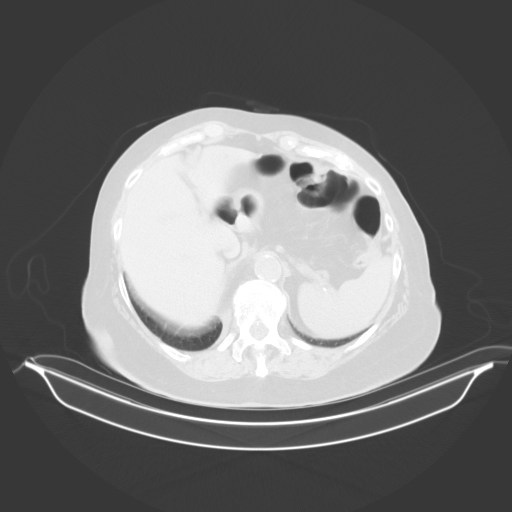
[im 83/93  soft-tissue]
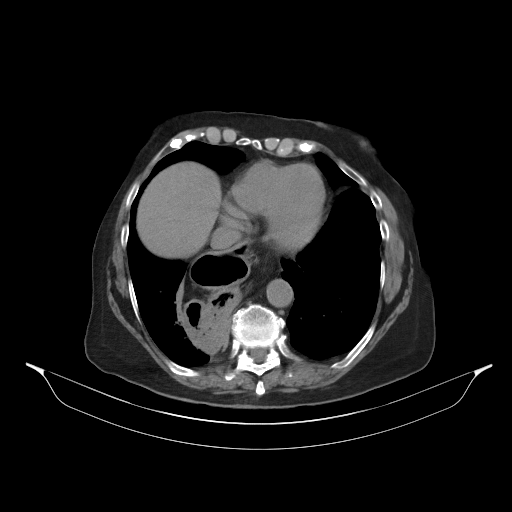
[im 83/93  lung]
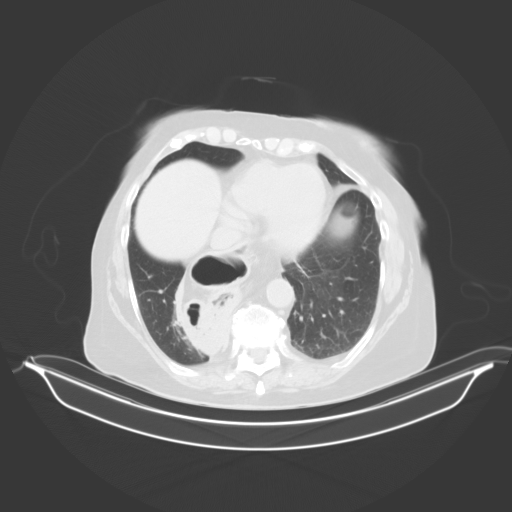
[im 83/93  bone]
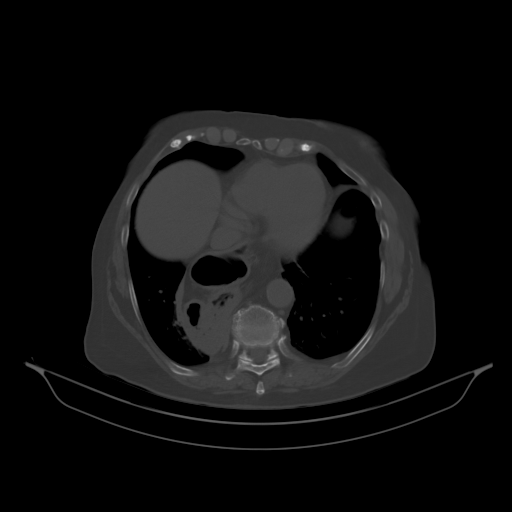

[Series 4: renal standard/full · axial · 0.92mm/px · z∈[-391,-301]mm · 3 of 93 slices shown (2 of 2)]
[im 10/93  soft-tissue]
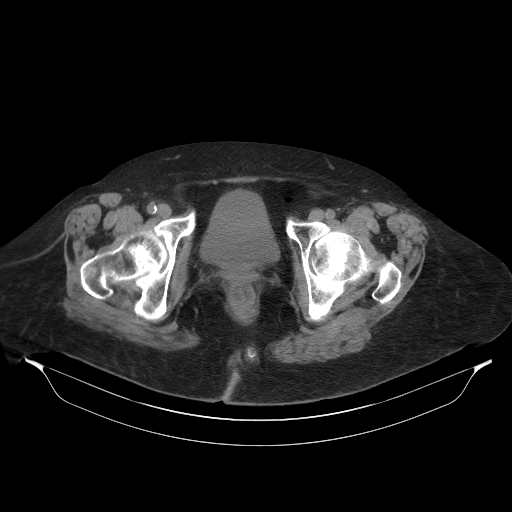
[im 19/93  soft-tissue]
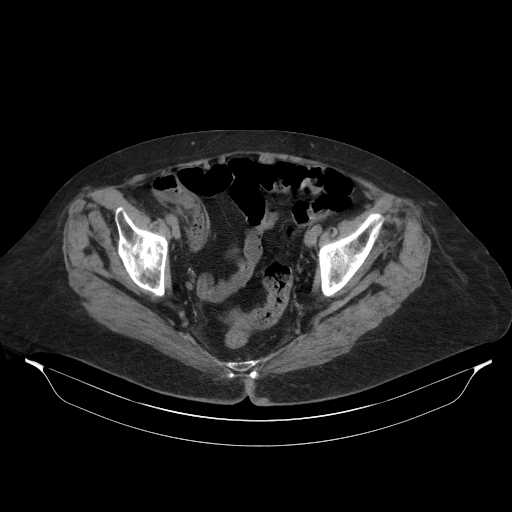
[im 28/93  soft-tissue]
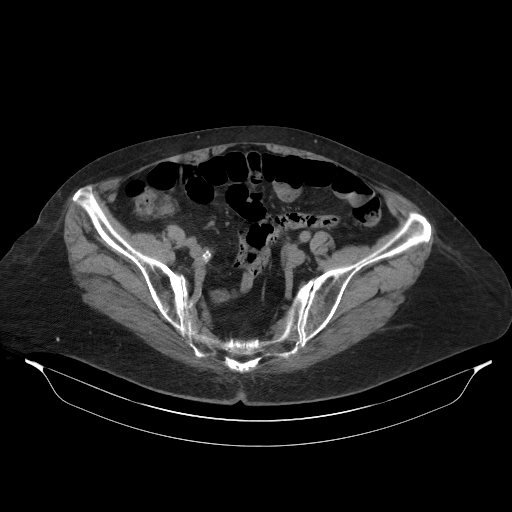

[12 of 32 positions shown; findings below may reference images not displayed]

FINDINGS: Lower chest: There is a 6 mm pulmonary nodule in the left lower lobe
on image [DATE] which is present since [PN] and considered benign.
Again seen is of the large hiatal hernia with adjacent right lung
compressive atelectasis. Scattered subsegmental atelectasis. Normal
size heart. No pericardial effusion. Mitral annulus calcifications.
Aortic atherosclerosis. Coronary artery calcifications.

Hepatobiliary: Unremarkable noncontrast appearance of the hepatic
parenchyma. Gallbladder surgically absent. Unchanged mild fusiform
dilation of the common bile duct measuring up to 11 mm.

Pancreas: Unremarkable

Spleen: Calcified splenic granuloma, otherwise unremarkable.

Adrenals/Urinary Tract: Bilateral adrenal glands are unremarkable.
No hydronephrosis. No nephrolithiasis. Urinary bladder is grossly
unremarkable for the degree of distension and lack of IV contrast.

Stomach/Bowel: Large hiatal hernia/intrathoracic stomach, unchanged.
No suspicious small bowel wall thickening or dilation. Normal
appendix.

Vascular/Lymphatic: Aortic atherosclerosis. No enlarged abdominal or
pelvic lymph nodes.

Reproductive: Status post hysterectomy. No adnexal masses.

Other: Tiny fat containing ventral hernia.

Musculoskeletal: Sequela of subcutaneous injections. Diffuse
demineralization of bone. L4-L5 and L5-S1 interbody disc spacers
with unchanged alignment. Multilevel degenerative changes spine.
Degenerative change of the right greater than left hips. No acute
osseous abnormality.
IMPRESSION: 1. No acute findings in the abdomen or pelvis. Specifically, no
evidence of obstructive uropathy.
2. Large hiatal hernia/intrathoracic stomach, unchanged.
3. Unchanged mild fusiform dilation of the common bile duct
measuring up to 11 mm, likely related to prior cholecystectomy.
4. Aortic atherosclerosis.  Aortic Atherosclerosis ([PN]-[PN]).

## 2020-08-07 MED ORDER — CIPROFLOXACIN HCL 500 MG PO TABS: 500.0000 mg | ORAL_TABLET | Freq: Two times a day (BID) | ORAL | 0 refills | Status: DC

## 2020-08-07 MED ORDER — CEFTRIAXONE SODIUM 1 G IJ SOLR
1.0000 g | Freq: Once | INTRAMUSCULAR | Status: AC
  Administered 2020-08-07: 1 g via INTRAMUSCULAR

## 2020-08-07 NOTE — Addendum Note (Signed)
Addended by: Jacob Moores on: 08/07/2020 01:42 PM   Modules accepted: Orders

## 2020-08-07 NOTE — Patient Instructions (Signed)
-  Nice seeing you today!!  -Lab work today; will notify you once results are available.  -Antibiotic shot in office today.  -Start cipro 500 mg twice daily for 10 days.  -NEED TO DRINK PLENTY OF FLUIDS!!  -CT scan has been ordered.  -If no improvement over weekend, consider ED evaluation.

## 2020-08-07 NOTE — Progress Notes (Signed)
Established Patient Office Visit     This visit occurred during the SARS-CoV-2 public health emergency.  Safety protocols were in place, including screening questions prior to the visit, additional usage of staff PPE, and extensive cleaning of exam room while observing appropriate contact time as indicated for disinfecting solutions.    CC/Reason for Visit: Left back pain, left groin pain, "I feel like I have hot inside"  HPI: Charlotte Castro is a 87 y.o. female who is coming in today for the above mentioned reasons.  For the past 2 weeks she has been having excruciating left-sided flank pain that radiates into her groin.  She feels extremely weak in her legs.  She has a sensation of hot and cold, she is not the best historian.  She is here today with her son.  Her son believes that she is not drinking enough fluids because she has been so weak that she is afraid of having to get out of bed to go frequently to the restroom.  She was hospitalized for Klebsiella UTI and sepsis back in August and ended up spending 21 days at skilled nursing facility.  She is not interested in a hospital visit today.   Past Medical/Surgical History: Past Medical History:  Diagnosis Date  . Anemia    history of  . Anxiety   . Arthritis    "left knee" (04/05/2017)  . Atrial fibrillation ([place])   . Atrial flutter ([place])    typical appearing  . Atrial tachycardia ([place])    ablated 11/17/10  by JA  from the [facility] of the aorta  . Chest pain on exertion   . CHF (congestive heart failure) ([place])   . Chronic lower back pain   . Chronic nausea   . Dizziness    chronic and of an unclear etiology  . Dyspnea   . Endometrial cancer ([facility])    grade 1  . Gallstone pancreatitis   . Gastritis   . GERD (gastroesophageal reflux disease)   . History of blood transfusion ~ 1948  . History of hiatal hernia   . HTN (hypertension)   . Hyperlipemia   . Hypothyroidism   . Left leg numbness   . Obesity   . Osteoporosis    . PMB (postmenopausal bleeding)   . Sinus headache   . Toe ulcer ([place])    left 3rd toe  . Vitamin D deficiency     Past Surgical History:  Procedure Laterality Date  . ATRIAL ABLATION SURGERY  11/17/10   Atrial tachycardia arising from [facility] of the aorta ablated by JA  . BACK SURGERY    . CARDIAC CATHETERIZATION  01/11/2011   Archie Endo 01/12/2011  . CHOLECYSTECTOMY N/A 04/08/2017   Procedure: LAPAROSCOPIC CHOLECYSTECTOMY;  Surgeon: Georganna Skeans, MD;  Location: [facility];  Service: General;  Laterality: N/A;  . FRACTURE SURGERY    . HYSTEROSCOPY WITH D & C N/A 08/05/2017   Procedure: DILATATION AND CURETTAGE /HYSTEROSCOPY AND POLYPECTOMY;  Surgeon: Osborne Oman, MD;  Location: [facility];  Service: Gynecology;  Laterality: N/A;  . LUMBAR SPINE SURGERY  ~ 1998  . ROBOTIC ASSISTED TOTAL HYSTERECTOMY WITH BILATERAL SALPINGO OOPHERECTOMY Bilateral 09/20/2017   Procedure: XI ROBOTIC ASSISTED TOTAL HYSTERECTOMY WITH BILATERAL SALPINGO OOPHORECTOMY, SENTINAL LYMPH NODE BIOPSY;  Surgeon: Everitt Amber, MD;  Location: [facility];  Service: Gynecology;  Laterality: Bilateral;  . SHOULDER SURGERY Right 1984 X2   Rollene Rotunda 01/19/2011  . WRIST FRACTURE SURGERY Left 1983  Social History:  reports that she has never smoked. She has never used smokeless tobacco. She reports that she does not drink alcohol and does not use drugs.  Allergies: Allergies  Allergen Reactions  . Iodinated Diagnostic Agents Shortness Of Breath and Other (See Comments)    "Allergic," per [facility]- Causes headaches, also  . Zosyn [Piperacillin Sod-Tazobactam So] Other (See Comments)    "Allergic," per [facility]  . Penicillins Other (See Comments)    Tolerated Zosyn Oct 2018, but "Allergic," per [facility] Did it involve swelling of the face/tongue/throat, SOB, or low BP? Unk Did it involve sudden or severe rash/hives, skin peeling, or any reaction on the inside of your mouth or nose? Unk Did you need to seek medical attention at a hospital or doctor's  office? Unk When did it last happen? Unk If all above answers are "NO", may proceed with cephalosporin use.     Family History:  Family History  Problem Relation Age of Onset  . Heart attack Father   . Hypertension Father      Current Outpatient Medications:  .  acetaminophen (TYLENOL) 325 MG tablet, Take 650 mg by mouth daily., Disp: , Rfl:  .  ciprofloxacin (CIPRO) 500 MG tablet, Take 1 tablet (500 mg total) by mouth 2 (two) times daily., Disp: 20 tablet, Rfl: 0 .  levothyroxine (SYNTHROID) 88 MCG tablet, TAKE ONE TABLET DAILY BEFORE BREAKFAST, Disp: 90 tablet, Rfl: 1 .  metoprolol tartrate (LOPRESSOR) 50 MG tablet, TAKE ONE TABLET TWICE DAILY WITH A MEAL, Disp: 180 tablet, Rfl: 1 .  vitamin B-12 (CYANOCOBALAMIN) 1000 MCG tablet, Take 500 mcg by mouth 2 (two) times daily., Disp: , Rfl:  .  warfarin (COUMADIN) 5 MG tablet, TAKE AS DIRECTED BY COUMADIN CLINIC, Disp: 25 tablet, Rfl: 1  Review of Systems:  Constitutional: Positive for fever, chills, diaphoresis, appetite change and fatigue.  HEENT: Denies photophobia, eye pain, redness, hearing loss, ear pain, congestion, sore throat, rhinorrhea, sneezing, mouth sores, trouble swallowing, neck pain, neck stiffness and tinnitus.   Respiratory: Denies SOB, DOE, cough, chest tightness,  and wheezing.   Cardiovascular: Denies chest pain, palpitations and leg swelling.  Gastrointestinal: Denies nausea, vomiting, diarrhea, constipation, blood in stool and abdominal distention.  Genitourinary: Positive for dysuria, urgency, frequency, hematuria, flank pain and difficulty urinating.  Endocrine: Denies: hot or cold intolerance, sweats, changes in hair or nails, polyuria, polydipsia. Musculoskeletal: Denies myalgias, back pain, joint swelling, arthralgias and gait problem.  Skin: Denies pallor, rash and wound.  Neurological: Denies dizziness, seizures, syncope, weakness, light-headedness, numbness and headaches.  Hematological: Denies  adenopathy. Easy bruising, personal or family bleeding history  Psychiatric/Behavioral: Denies suicidal ideation, mood changes, confusion, nervousness, sleep disturbance and agitation    Physical Exam: Vitals:   08/07/20 1043  BP: (!) 150/90  Pulse: 71  Temp: 97.8 F (36.6 C)  TempSrc: Oral  SpO2: 99%  Weight: 200 lb 12.8 oz (91.1 kg)    Body mass index is 30.53 kg/m.   Constitutional: NAD, calm, comfortable Eyes: PERRL, lids and conjunctivae normal ENMT: Mucous membranes are dry.  Respiratory: clear to auscultation bilaterally, no wheezing, no crackles. Normal respiratory effort. No accessory muscle use.  Cardiovascular: Regular rate and rhythm, no murmurs / rubs / gallops. No extremity edema.  Abdomen: Positive left sided CVA tenderness, no masses palpated. No hepatosplenomegaly. Bowel sounds positive.  Psychiatric: Normal judgment and insight. Alert and oriented x 3.  Appears anxious   Impression and Plan:  Pyelonephritis -Urine dipstick in office today positive  for blood, leukocytes, nitrate. -Given her severe left-sided flank pain and her chills, I suspect she has pyelonephritis, maybe even early sepsis. -She is adamantly opposed to a hospital visit today.  As she has no nausea and vomiting I think it is reasonable to try outpatient therapy first. -She has been given 1 g of Rocephin in office today.  Despite her allergy to penicillin that is listed in chart she has safely received Rocephin in the past at the hospital. -I will also give her a prescription for Cipro for 10 days. -I will order urine analysis and culture as well as send for CBC, CMP and blood cultures. -She has been instructed to drink copious amounts of oral fluids. -If she worsens over the weekend or if blood cultures return positive, she will need ED visit for further evaluation. -CT abdomen has also been requested.  Time spent: 38 minutes reviewing chart, obtaining history and examining patient,  developing plan of care and discussing with patient and son.   Patient Instructions  -Nice seeing you today!!  -Lab work today; will notify you once results are available.  -Antibiotic shot in office today.  -Start cipro 500 mg twice daily for 10 days.  -NEED TO DRINK PLENTY OF FLUIDS!!  -CT scan has been ordered.  -If no improvement over weekend, consider ED evaluation.     Lelon Frohlich, MD [facility]

## 2020-08-09 LAB — URINE CULTURE
MICRO NUMBER:: 11575037
SPECIMEN QUALITY:: ADEQUATE

## 2020-08-13 ENCOUNTER — Other Ambulatory Visit: Payer: Self-pay

## 2020-08-13 ENCOUNTER — Ambulatory Visit (INDEPENDENT_AMBULATORY_CARE_PROVIDER_SITE_OTHER): Payer: Medicare Other | Admitting: *Deleted

## 2020-08-13 DIAGNOSIS — Z5181 Encounter for therapeutic drug level monitoring: Secondary | ICD-10-CM

## 2020-08-13 DIAGNOSIS — I4821 Permanent atrial fibrillation: Secondary | ICD-10-CM | POA: Diagnosis not present

## 2020-08-13 LAB — CULTURE, BLOOD (SINGLE)
MICRO NUMBER:: 11576431
MICRO NUMBER:: 11576432

## 2020-08-13 LAB — COMPREHENSIVE METABOLIC PANEL
AG Ratio: 1.6 (calc) (ref 1.0–2.5)
ALT: 13 U/L (ref 6–29)
AST: 17 U/L (ref 10–35)
Albumin: 4.2 g/dL (ref 3.6–5.1)
Alkaline phosphatase (APISO): 81 U/L (ref 37–153)
BUN/Creatinine Ratio: 27 (calc) — ABNORMAL HIGH (ref 6–22)
BUN: 25 mg/dL (ref 7–25)
CO2: 23 mmol/L (ref 20–32)
Calcium: 9.8 mg/dL (ref 8.6–10.4)
Chloride: 108 mmol/L (ref 98–110)
Creat: 0.94 mg/dL — ABNORMAL HIGH (ref 0.60–0.88)
Globulin: 2.6 g/dL (calc) (ref 1.9–3.7)
Glucose, Bld: 101 mg/dL (ref 65–139)
Potassium: 4 mmol/L (ref 3.5–5.3)
Sodium: 143 mmol/L (ref 135–146)
Total Bilirubin: 1 mg/dL (ref 0.2–1.2)
Total Protein: 6.8 g/dL (ref 6.1–8.1)

## 2020-08-13 LAB — CBC WITH DIFFERENTIAL/PLATELET
Absolute Monocytes: 706 cells/uL (ref 200–950)
Basophils Absolute: 58 cells/uL (ref 0–200)
Basophils Relative: 0.8 %
Eosinophils Absolute: 209 cells/uL (ref 15–500)
Eosinophils Relative: 2.9 %
HCT: 42.8 % (ref 35.0–45.0)
Hemoglobin: 14.9 g/dL (ref 11.7–15.5)
Lymphs Abs: 1793 cells/uL (ref 850–3900)
MCH: 32.8 pg (ref 27.0–33.0)
MCHC: 34.8 g/dL (ref 32.0–36.0)
MCV: 94.3 fL (ref 80.0–100.0)
MPV: 9.8 fL (ref 7.5–12.5)
Monocytes Relative: 9.8 %
Neutro Abs: 4435 cells/uL (ref 1500–7800)
Neutrophils Relative %: 61.6 %
Platelets: 189 10*3/uL (ref 140–400)
RBC: 4.54 10*6/uL (ref 3.80–5.10)
RDW: 12.8 % (ref 11.0–15.0)
Total Lymphocyte: 24.9 %
WBC: 7.2 10*3/uL (ref 3.8–10.8)

## 2020-08-13 LAB — POCT INR: INR: 1.7 — AB (ref 2.0–3.0)

## 2020-08-13 NOTE — Patient Instructions (Addendum)
Description    Start taking warfarin 1/2 a tablet daily except for 1 tablet on Mondays, Wednesdays and Fridays. Call coumadin clinic for any changes in medications or upcoming procedures. Recheck INR in 4 weeks. Coumadin Clinic [phone].

## 2020-09-10 ENCOUNTER — Ambulatory Visit (INDEPENDENT_AMBULATORY_CARE_PROVIDER_SITE_OTHER): Payer: Medicare Other | Admitting: *Deleted

## 2020-09-10 ENCOUNTER — Other Ambulatory Visit: Payer: Self-pay

## 2020-09-10 DIAGNOSIS — Z5181 Encounter for therapeutic drug level monitoring: Secondary | ICD-10-CM

## 2020-09-10 DIAGNOSIS — I4821 Permanent atrial fibrillation: Secondary | ICD-10-CM | POA: Diagnosis not present

## 2020-09-10 LAB — POCT INR: INR: 1.8 — AB (ref 2.0–3.0)

## 2020-09-10 NOTE — Patient Instructions (Signed)
Description   Today take 1.5 tablets then start taking Warfarin 1 tablet daily except for 1/2 tablet on Tuesdays, Thursdays, and Saturdays. Call Coumadin Clinic for any changes in medications or upcoming procedures. Recheck INR in 3 weeks. Coumadin Clinic [phone].

## 2020-09-19 ENCOUNTER — Other Ambulatory Visit: Payer: Self-pay | Admitting: *Deleted

## 2020-09-19 DIAGNOSIS — E039 Hypothyroidism, unspecified: Secondary | ICD-10-CM

## 2020-09-19 MED ORDER — LEVOTHYROXINE SODIUM 88 MCG PO TABS: ORAL_TABLET | ORAL | 1 refills | Status: DC

## 2020-10-14 ENCOUNTER — Ambulatory Visit (INDEPENDENT_AMBULATORY_CARE_PROVIDER_SITE_OTHER): Payer: Medicare Other | Admitting: *Deleted

## 2020-10-14 ENCOUNTER — Other Ambulatory Visit: Payer: Self-pay

## 2020-10-14 DIAGNOSIS — I4821 Permanent atrial fibrillation: Secondary | ICD-10-CM | POA: Diagnosis not present

## 2020-10-14 DIAGNOSIS — Z5181 Encounter for therapeutic drug level monitoring: Secondary | ICD-10-CM

## 2020-10-14 LAB — POCT INR: INR: 2.2 (ref 2.0–3.0)

## 2020-10-14 NOTE — Patient Instructions (Signed)
Description   Continue taking Warfarin 1 tablet daily except for 1/2 tablet on Tuesdays, Thursdays, and Saturdays. Call Coumadin Clinic for any changes in medications or upcoming procedures. Recheck INR in 4 weeks. Coumadin Clinic [phone].

## 2020-10-20 ENCOUNTER — Other Ambulatory Visit: Payer: Self-pay | Admitting: Interventional Cardiology

## 2020-10-20 DIAGNOSIS — I4821 Permanent atrial fibrillation: Secondary | ICD-10-CM

## 2020-11-12 ENCOUNTER — Other Ambulatory Visit: Payer: Self-pay

## 2020-11-12 ENCOUNTER — Ambulatory Visit (INDEPENDENT_AMBULATORY_CARE_PROVIDER_SITE_OTHER): Payer: Medicare Other

## 2020-11-12 DIAGNOSIS — Z5181 Encounter for therapeutic drug level monitoring: Secondary | ICD-10-CM | POA: Diagnosis not present

## 2020-11-12 DIAGNOSIS — I4821 Permanent atrial fibrillation: Secondary | ICD-10-CM

## 2020-11-12 LAB — POCT INR: INR: 2 (ref 2.0–3.0)

## 2020-11-12 NOTE — Patient Instructions (Signed)
Description   Start taking Warfarin 1 tablet daily except for 1/2 tablet on Tuesdays and Saturdays. Call Coumadin Clinic for any changes in medications or upcoming procedures. Recheck INR in 4 weeks. [facility]in Clinic [phone].

## 2020-12-10 ENCOUNTER — Ambulatory Visit (INDEPENDENT_AMBULATORY_CARE_PROVIDER_SITE_OTHER): Payer: Medicare Other | Admitting: *Deleted

## 2020-12-10 ENCOUNTER — Other Ambulatory Visit: Payer: Self-pay

## 2020-12-10 DIAGNOSIS — I4821 Permanent atrial fibrillation: Secondary | ICD-10-CM

## 2020-12-10 DIAGNOSIS — Z5181 Encounter for therapeutic drug level monitoring: Secondary | ICD-10-CM | POA: Diagnosis not present

## 2020-12-10 LAB — POCT INR: INR: 2 (ref 2.0–3.0)

## 2020-12-10 NOTE — Patient Instructions (Signed)
Description   Continue taking Warfarin 1 tablet daily except for 1/2 tablet on Tuesdays and Saturdays. Call Coumadin Clinic for any changes in medications or upcoming procedures. Recheck INR in 5 weeks. [facility]in Clinic [phone].

## 2020-12-18 ENCOUNTER — Other Ambulatory Visit: Payer: Self-pay | Admitting: Interventional Cardiology

## 2020-12-18 DIAGNOSIS — I4821 Permanent atrial fibrillation: Secondary | ICD-10-CM

## 2020-12-18 DIAGNOSIS — I10 Essential (primary) hypertension: Secondary | ICD-10-CM

## 2021-01-13 ENCOUNTER — Emergency Department (HOSPITAL_COMMUNITY): Payer: Medicare Other

## 2021-01-13 ENCOUNTER — Encounter (HOSPITAL_COMMUNITY): Payer: Self-pay | Admitting: Emergency Medicine

## 2021-01-13 ENCOUNTER — Observation Stay (HOSPITAL_COMMUNITY)
Admission: EM | Admit: 2021-01-13 | Discharge: 2021-01-14 | Disposition: A | Discharge status: Home / Self Care | Payer: Medicare Other | Attending: Internal Medicine | Admitting: Internal Medicine

## 2021-01-13 ENCOUNTER — Other Ambulatory Visit: Payer: Self-pay

## 2021-01-13 DIAGNOSIS — Z7901 Long term (current) use of anticoagulants: Secondary | ICD-10-CM | POA: Diagnosis not present

## 2021-01-13 DIAGNOSIS — Z8542 Personal history of malignant neoplasm of other parts of uterus: Secondary | ICD-10-CM | POA: Diagnosis not present

## 2021-01-13 DIAGNOSIS — I4821 Permanent atrial fibrillation: Secondary | ICD-10-CM | POA: Diagnosis not present

## 2021-01-13 DIAGNOSIS — E538 Deficiency of other specified B group vitamins: Secondary | ICD-10-CM | POA: Diagnosis not present

## 2021-01-13 DIAGNOSIS — I5032 Chronic diastolic (congestive) heart failure: Secondary | ICD-10-CM | POA: Diagnosis not present

## 2021-01-13 DIAGNOSIS — R079 Chest pain, unspecified: Secondary | ICD-10-CM | POA: Diagnosis present

## 2021-01-13 DIAGNOSIS — R42 Dizziness and giddiness: Secondary | ICD-10-CM

## 2021-01-13 DIAGNOSIS — Z79899 Other long term (current) drug therapy: Secondary | ICD-10-CM | POA: Diagnosis not present

## 2021-01-13 DIAGNOSIS — I110 Hypertensive heart disease with heart failure: Secondary | ICD-10-CM | POA: Diagnosis not present

## 2021-01-13 DIAGNOSIS — Z20822 Contact with and (suspected) exposure to covid-19: Secondary | ICD-10-CM | POA: Insufficient documentation

## 2021-01-13 DIAGNOSIS — R55 Syncope and collapse: Secondary | ICD-10-CM | POA: Diagnosis not present

## 2021-01-13 DIAGNOSIS — K449 Diaphragmatic hernia without obstruction or gangrene: Secondary | ICD-10-CM

## 2021-01-13 DIAGNOSIS — R0789 Other chest pain: Principal | ICD-10-CM | POA: Insufficient documentation

## 2021-01-13 DIAGNOSIS — L97529 Non-pressure chronic ulcer of other part of left foot with unspecified severity: Secondary | ICD-10-CM | POA: Diagnosis present

## 2021-01-13 DIAGNOSIS — I951 Orthostatic hypotension: Secondary | ICD-10-CM

## 2021-01-13 DIAGNOSIS — E039 Hypothyroidism, unspecified: Secondary | ICD-10-CM | POA: Diagnosis present

## 2021-01-13 DIAGNOSIS — I10 Essential (primary) hypertension: Secondary | ICD-10-CM | POA: Diagnosis present

## 2021-01-13 DIAGNOSIS — I83025 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: Secondary | ICD-10-CM | POA: Diagnosis present

## 2021-01-13 LAB — TROPONIN I (HIGH SENSITIVITY)
Troponin I (High Sensitivity): 16 ng/L (ref <18)
Troponin I (High Sensitivity): 16 ng/L (ref <18)

## 2021-01-13 LAB — CBC WITH DIFFERENTIAL/PLATELET
Abs Immature Granulocytes: 0.05 10*3/uL (ref 0.00–0.07)
Basophils Absolute: 0 10*3/uL (ref 0.0–0.1)
Basophils Relative: 1 %
Eosinophils Absolute: 0.1 10*3/uL (ref 0.0–0.5)
Eosinophils Relative: 2 %
HCT: 42.2 % (ref 36.0–46.0)
Hemoglobin: 14.8 g/dL (ref 12.0–15.0)
Immature Granulocytes: 1 %
Lymphocytes Relative: 28 %
Lymphs Abs: 2.3 10*3/uL (ref 0.7–4.0)
MCH: 33 pg (ref 26.0–34.0)
MCHC: 35.1 g/dL (ref 30.0–36.0)
MCV: 94 fL (ref 80.0–100.0)
Monocytes Absolute: 0.9 10*3/uL (ref 0.1–1.0)
Monocytes Relative: 11 %
Neutro Abs: 4.9 10*3/uL (ref 1.7–7.7)
Neutrophils Relative %: 57 %
Platelets: 186 10*3/uL (ref 150–400)
RBC: 4.49 MIL/uL (ref 3.87–5.11)
RDW: 13.4 % (ref 11.5–15.5)
WBC: 8.3 10*3/uL (ref 4.0–10.5)
nRBC: 0 % (ref 0.0–0.2)

## 2021-01-13 LAB — URINALYSIS, ROUTINE W REFLEX MICROSCOPIC
Bilirubin Urine: NEGATIVE
Glucose, UA: NEGATIVE mg/dL
Hgb urine dipstick: NEGATIVE
Ketones, ur: NEGATIVE mg/dL
Leukocytes,Ua: NEGATIVE
Nitrite: NEGATIVE
Protein, ur: NEGATIVE mg/dL
Specific Gravity, Urine: 1.009 (ref 1.005–1.030)
pH: 7 (ref 5.0–8.0)

## 2021-01-13 LAB — COMPREHENSIVE METABOLIC PANEL
ALT: 16 U/L (ref 0–44)
AST: 23 U/L (ref 15–41)
Albumin: 4 g/dL (ref 3.5–5.0)
Alkaline Phosphatase: 65 U/L (ref 38–126)
Anion gap: 7 (ref 5–15)
BUN: 17 mg/dL (ref 8–23)
CO2: 23 mmol/L (ref 22–32)
Calcium: 9.8 mg/dL (ref 8.9–10.3)
Chloride: 109 mmol/L (ref 98–111)
Creatinine, Ser: 1.02 mg/dL — ABNORMAL HIGH (ref 0.44–1.00)
GFR, Estimated: 54 mL/min — ABNORMAL LOW (ref >60)
Glucose, Bld: 99 mg/dL (ref 70–99)
Potassium: 4 mmol/L (ref 3.5–5.1)
Sodium: 139 mmol/L (ref 135–145)
Total Bilirubin: 1.6 mg/dL — ABNORMAL HIGH (ref 0.3–1.2)
Total Protein: 6.7 g/dL (ref 6.5–8.1)

## 2021-01-13 LAB — BRAIN NATRIURETIC PEPTIDE: B Natriuretic Peptide: 125.5 pg/mL — ABNORMAL HIGH (ref 0.0–100.0)

## 2021-01-13 LAB — PROTIME-INR
INR: 2.5 — ABNORMAL HIGH (ref 0.8–1.2)
Prothrombin Time: 27.4 seconds — ABNORMAL HIGH (ref 11.4–15.2)

## 2021-01-13 LAB — RESP PANEL BY RT-PCR (FLU A&B, COVID) ARPGX2
Influenza A by PCR: NEGATIVE
Influenza B by PCR: NEGATIVE
SARS Coronavirus 2 by RT PCR: NEGATIVE

## 2021-01-13 LAB — VITAMIN D 25 HYDROXY (VIT D DEFICIENCY, FRACTURES): Vit D, 25-Hydroxy: 24.72 ng/mL — ABNORMAL LOW (ref 30–100)

## 2021-01-13 LAB — VITAMIN B12: Vitamin B-12: 4712 pg/mL — ABNORMAL HIGH (ref 180–914)

## 2021-01-13 LAB — TSH: TSH: 1.305 u[IU]/mL (ref 0.350–4.500)

## 2021-01-13 IMAGING — DX DG CHEST 2V
2 series · 3 of 3 positions shown · non-contrast
Comparison: [DATE]

CLINICAL DATA: Chest pain

EXAM:
CHEST - 2 VIEW

[Series 2: chest lat · 0.14mm/px · 2 of 2 slices shown]
[im 1/2]
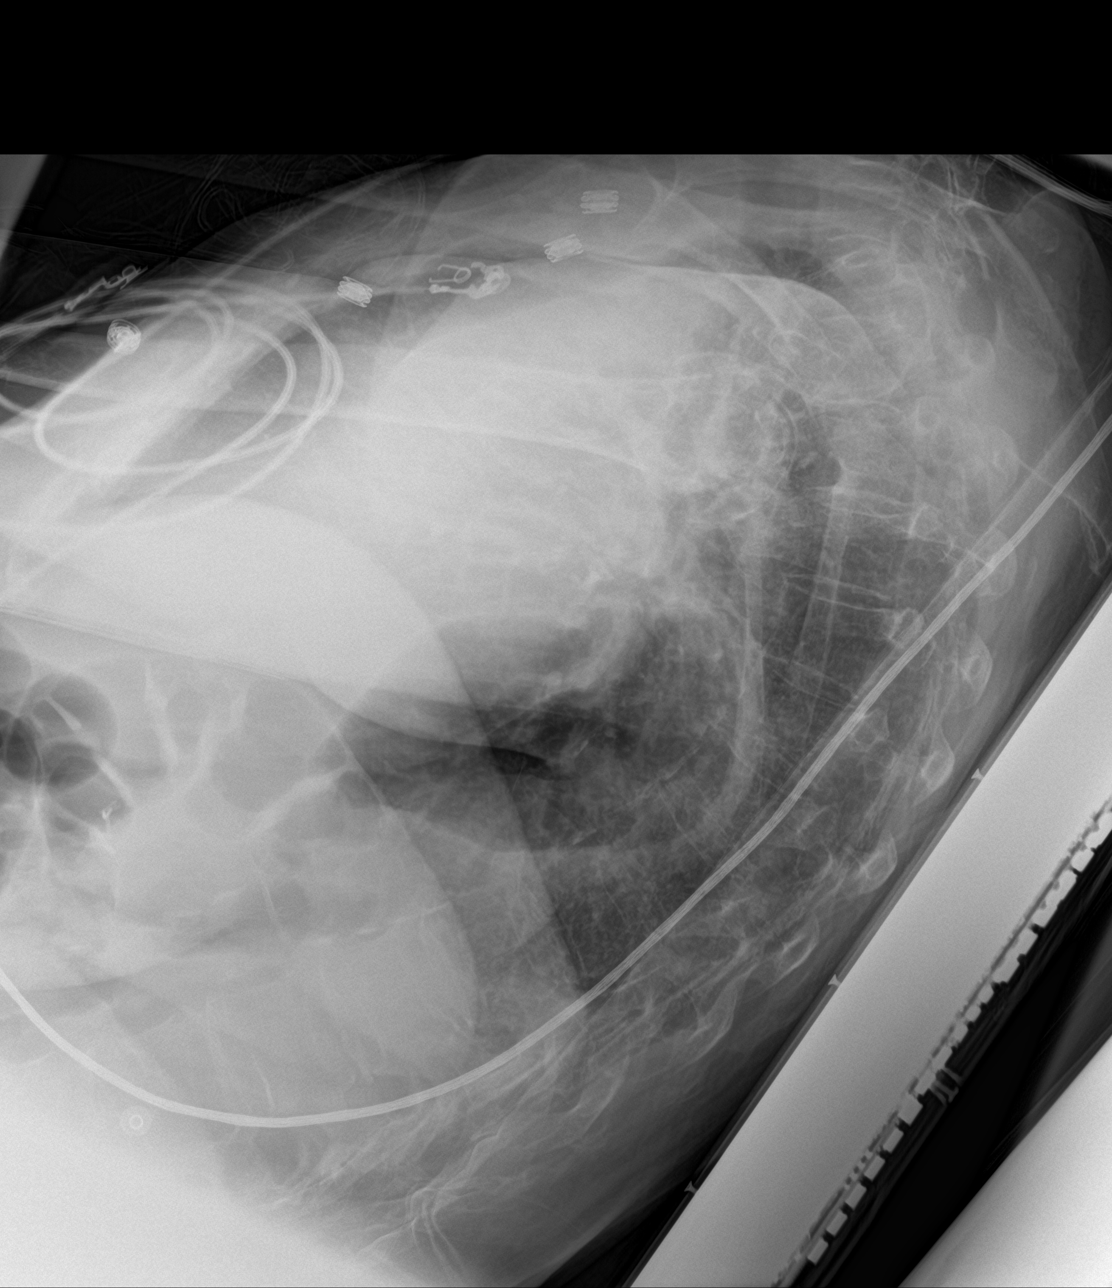
[im 2/2]
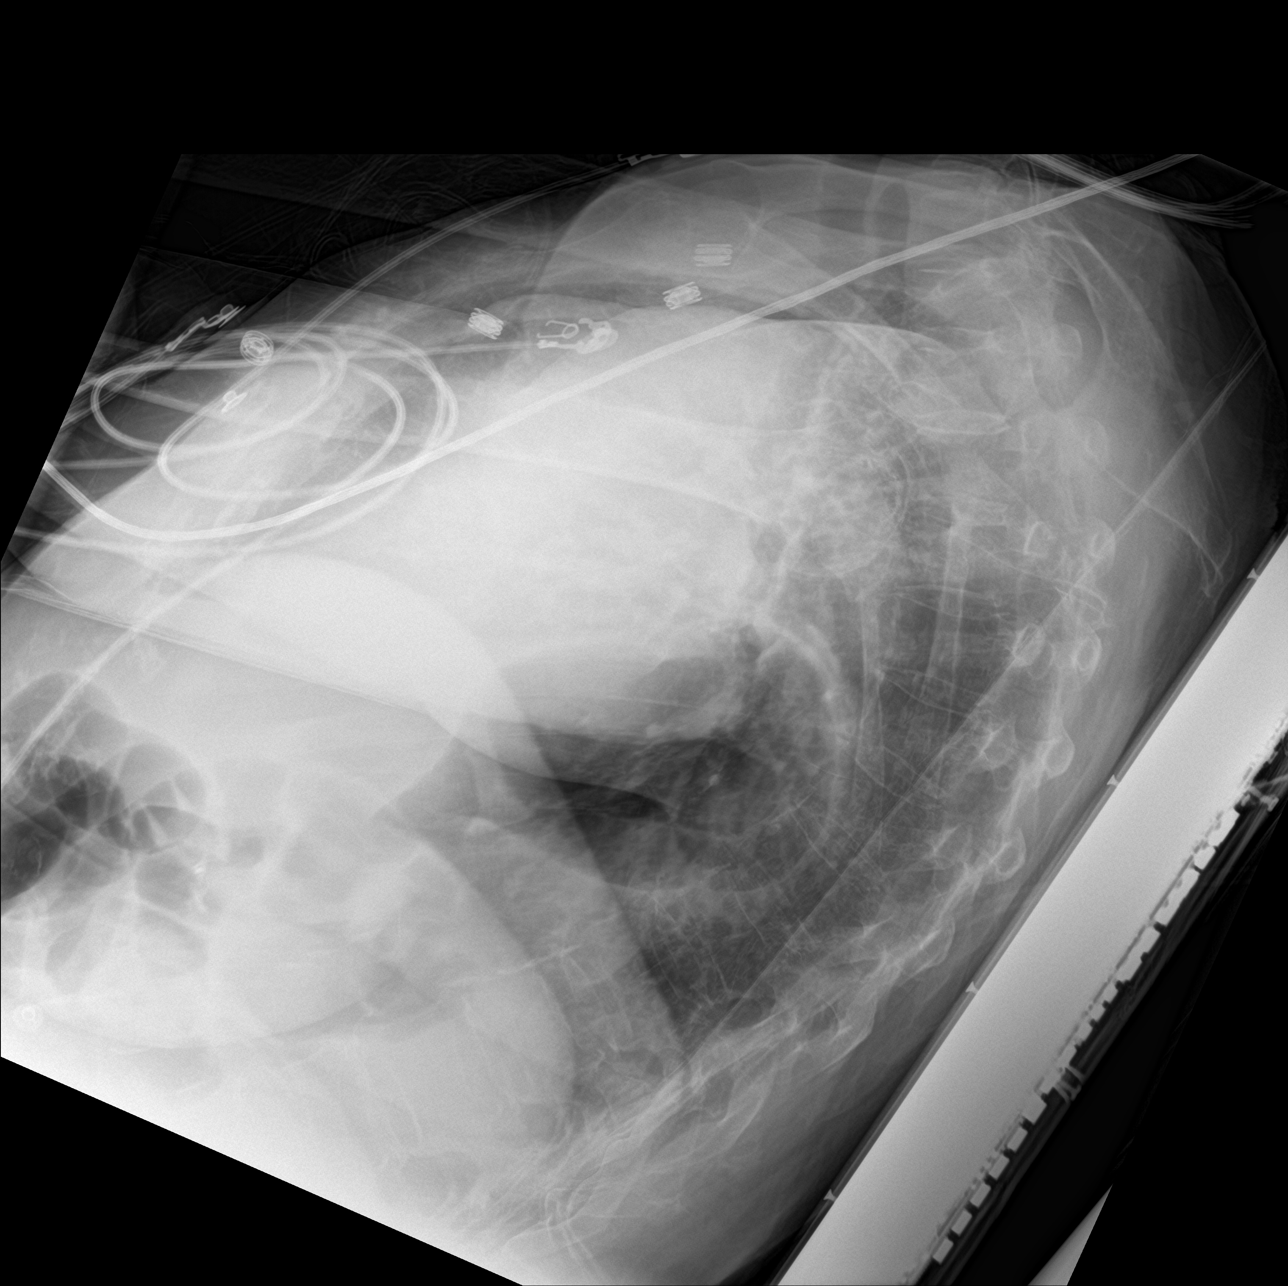

[chest ap]
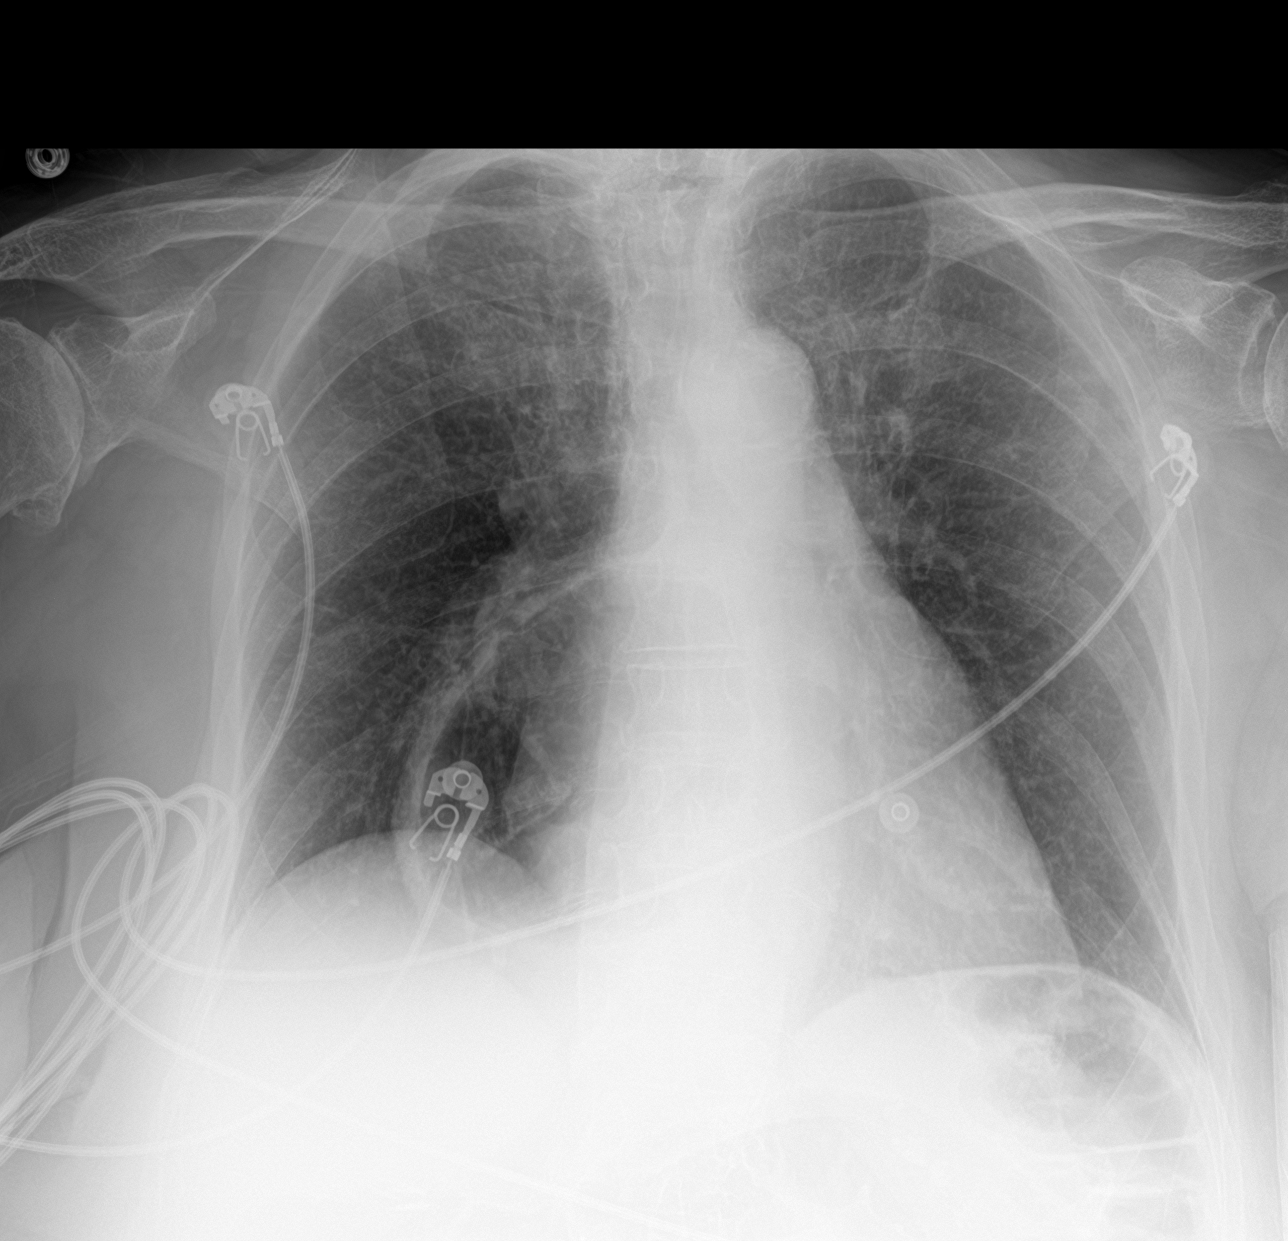

[3 of 3 positions shown; findings below may reference images not displayed]

FINDINGS: Stable cardiomediastinal contours. Large hiatal hernia is again
noted. No pleural effusion or edema. No airspace densities
identified. Unchanged, chronic upper lung zone predominant
interstitial coarsening. The visualized osseous structures are
unremarkable.
IMPRESSION: 1. No acute cardiopulmonary abnormalities.
2. Large hiatal hernia.

## 2021-01-13 MED ORDER — SUCRALFATE 1 GM/10ML PO SUSP
1.0000 g | Freq: Two times a day (BID) | ORAL | Status: DC
  Administered 2021-01-13 – 2021-01-14 (×2): 1 g via ORAL
  Filled 2021-01-13 (×2): qty 10

## 2021-01-13 MED ORDER — GABAPENTIN 100 MG PO CAPS
100.0000 mg | ORAL_CAPSULE | Freq: Two times a day (BID) | ORAL | Status: DC
  Administered 2021-01-13 – 2021-01-14 (×2): 100 mg via ORAL
  Filled 2021-01-13 (×2): qty 1

## 2021-01-13 MED ORDER — METOPROLOL TARTRATE 25 MG PO TABS
25.0000 mg | ORAL_TABLET | Freq: Two times a day (BID) | ORAL | Status: DC
  Administered 2021-01-13 – 2021-01-14 (×2): 25 mg via ORAL
  Filled 2021-01-13 (×2): qty 1

## 2021-01-13 MED ORDER — PANTOPRAZOLE SODIUM 40 MG IV SOLR: 40.0000 mg | INTRAVENOUS | Status: DC

## 2021-01-13 MED ORDER — PANTOPRAZOLE SODIUM 40 MG PO TBEC
40.0000 mg | DELAYED_RELEASE_TABLET | Freq: Every day | ORAL | Status: DC
  Administered 2021-01-13 – 2021-01-14 (×2): 40 mg via ORAL
  Filled 2021-01-13 (×2): qty 1

## 2021-01-13 MED ORDER — WARFARIN SODIUM 5 MG PO TABS: 5.0000 mg | ORAL_TABLET | Freq: Once | ORAL | Status: DC

## 2021-01-13 MED ORDER — FAMOTIDINE 40 MG/5ML PO SUSR: 20.0000 mg | Freq: Every day | ORAL | Status: DC

## 2021-01-13 MED ORDER — VITAMIN B-12 100 MCG PO TABS
500.0000 ug | ORAL_TABLET | Freq: Two times a day (BID) | ORAL | Status: DC
  Administered 2021-01-13 – 2021-01-14 (×2): 500 ug via ORAL
  Filled 2021-01-13: qty 1
  Filled 2021-01-13: qty 5

## 2021-01-13 MED ORDER — ACETAMINOPHEN 325 MG PO TABS: 650.0000 mg | ORAL_TABLET | Freq: Every day | ORAL | Status: DC

## 2021-01-13 MED ORDER — LEVOTHYROXINE SODIUM 88 MCG PO TABS
88.0000 ug | ORAL_TABLET | Freq: Every day | ORAL | Status: DC
  Administered 2021-01-14: 88 ug via ORAL
  Filled 2021-01-13 (×2): qty 1

## 2021-01-13 MED ORDER — ONDANSETRON HCL 4 MG/2ML IJ SOLN: 4.0000 mg | Freq: Four times a day (QID) | INTRAMUSCULAR | Status: DC | PRN

## 2021-01-13 MED ORDER — ACETAMINOPHEN 325 MG PO TABS
650.0000 mg | ORAL_TABLET | ORAL | Status: DC | PRN
  Administered 2021-01-14: 650 mg via ORAL
  Filled 2021-01-13: qty 2

## 2021-01-13 NOTE — ED Notes (Signed)
Pt said she didn't want me speaking with her daughter so daughter called and pt spoke with her.

## 2021-01-13 NOTE — H&P (Addendum)
History and Physical    DOA: 01/13/2021  PCP: Isaac Bliss, Rayford Halsted, MD  Patient coming from: home  Chief Complaint: chest pain and presyncope  HPI: VIVIANNA Castro is a 87 y.o. female with history h/o hypertension, CHF, GERD, hiatal hernia, atrial fibrillation on chronic anticoagulation with therapeutic INR, hypothyroidism, hyperlipidemia presented with myriad of symptoms-she mainly complains of chest "discomfort" and points to lower sternal area which radiates to her neck and gives a feeling of numbness in her tongue and back of her throat.  She describes occasional burning sensation as well.  She states these episodes are sometimes associated with palpitations and cold sweats.  She is also concerned about dizzy spells that she experiences on changing positions.  She is constantly worried about bilateral knee arthritis and her legs giving away/fall risk.  She reports tingling and numbness in bilateral upper extremities and relates that to her shoulder arthritis.  Takes vitamin D supplements.  Is compliant with Coumadin and INR therapeutic in this admission. ED work-up essentially unremarkable other than somewhat elevated blood pressure.  Requested to admit patient for chest pain and presyncope work-up.  Review of Systems: As per HPI, otherwise review of systems negative.    Past Medical History:  Diagnosis Date   Anemia    history of   Anxiety    Arthritis    "left knee" (04/05/2017)   Atrial fibrillation (HCC)    Atrial flutter ([place])    typical appearing   Atrial tachycardia ([place])    ablated 11/17/10  by JA  from the [facility] of the aorta   CHF (congestive heart failure) (HCC)    Chronic lower back pain    Chronic nausea    Endometrial cancer (HCC)    grade 1   Gallstone pancreatitis    Gastritis    GERD (gastroesophageal reflux disease)    History of hiatal hernia    HTN (hypertension)    Hyperlipemia    Hypothyroidism    Obesity    Osteoporosis    Toe ulcer ([place])    left  3rd toe   Vitamin D deficiency     Past Surgical History:  Procedure Laterality Date   ATRIAL ABLATION SURGERY  11/17/10   Atrial tachycardia arising from [facility] of the aorta ablated by [place]  01/11/2011   Archie Endo 01/12/2011   CHOLECYSTECTOMY N/A 04/08/2017   Procedure: LAPAROSCOPIC CHOLECYSTECTOMY;  Surgeon: Georganna Skeans, MD;  Location: [place];  Service: General;  Laterality: N/A;   FRACTURE SURGERY     HYSTEROSCOPY WITH D & C N/A 08/05/2017   Procedure: DILATATION AND CURETTAGE /HYSTEROSCOPY AND POLYPECTOMY;  Surgeon: Osborne Oman, MD;  Location: [facility];  Service: Gynecology;  Laterality: N/A;   LUMBAR SPINE SURGERY  ~ [place] SALPINGO OOPHERECTOMY Bilateral 09/20/2017   Procedure: XI ROBOTIC ASSISTED TOTAL HYSTERECTOMY WITH BILATERAL SALPINGO OOPHORECTOMY, SENTINAL LYMPH NODE BIOPSY;  Surgeon: Everitt Amber, MD;  Location: [facility];  Service: Gynecology;  Laterality: Bilateral;   SHOULDER SURGERY Right 1984 X2   /ntoes 01/19/2011   WRIST FRACTURE SURGERY Left 1983    Social history:  reports that she has never smoked. She has never used smokeless tobacco. She reports that she does not drink alcohol and does not use drugs.   Allergies  Allergen Reactions   Iodinated Diagnostic Agents Shortness Of Breath and Other (See Comments)    "Allergic," per [facility]- Causes  headaches, also   Zosyn [Piperacillin Sod-Tazobactam So] Other (See Comments)    "Allergic," per [facility]   Penicillins Other (See Comments)    Tolerated Zosyn Oct 2018, but "Allergic," per [facility] Did it involve swelling of the face/tongue/throat, SOB, or low BP? Unk Did it involve sudden or severe rash/hives, skin peeling, or any reaction on the inside of your mouth or nose? Unk Did you need to seek medical attention at a hospital or doctor's office? Unk When did it last happen? Unk If all above answers are "NO", may proceed with cephalosporin  use.     Family History  Problem Relation Age of Onset   Heart attack Father    Hypertension Father       Prior to Admission medications   Medication Sig Start Date End Date Taking? Authorizing Provider  acetaminophen (TYLENOL) 325 MG tablet Take 650 mg by mouth daily.    [provider]  ciprofloxacin (CIPRO) 500 MG tablet Take 1 tablet (500 mg total) by mouth 2 (two) times daily. 08/07/20   Isaac Bliss, Rayford Halsted, MD  levothyroxine (SYNTHROID) 88 MCG tablet TAKE ONE TABLET DAILY BEFORE BREAKFAST 09/19/20   Isaac Bliss, Rayford Halsted, MD  metoprolol tartrate (LOPRESSOR) 50 MG tablet TAKE ONE TABLET TWICE DAILY WITH A MEAL 12/18/20   Jettie Booze, MD  vitamin B-12 (CYANOCOBALAMIN) 1000 MCG tablet Take 500 mcg by mouth 2 (two) times daily.    [provider]  warfarin (COUMADIN) 5 MG tablet TAKE AS DIRECTED BY COUMADIN CLINIC 12/18/20   Jettie Booze, MD    Physical Exam: Vitals:   01/13/21 1710 01/13/21 1730 01/13/21 1800 01/13/21 1815  BP: (!) 145/92 (!) 146/79 (!) 148/77 (!) 160/81  Pulse: 98 75 77 78  Resp: (!) _0 Temp:      TempSrc:      SpO2: 100% 91% 96% 100%    Constitutional: NAD, calm, comfortable.  Noted to have some resting tremors Eyes: PERRL, lids and conjunctivae normal ENMT: Mucous membranes are moist. Posterior pharynx clear of any exudate or lesions.Normal dentition.  Neck: normal, supple, no masses, no thyromegaly Respiratory: clear to auscultation bilaterally, no wheezing, no crackles. Normal respiratory effort. No accessory muscle use.  Cardiovascular: Regular rate and rhythm, no murmurs / rubs / gallops. No extremity edema. 2+ pedal pulses. No carotid bruits.  Prominent veins in bilateral feet and lower third of extremities Abdomen: no tenderness, no masses palpated. No hepatosplenomegaly. Bowel sounds positive.  Musculoskeletal: no clubbing / cyanosis. Normal muscle tone.  Bilateral knee arthritis. Neurologic: CN  2-12 grossly intact. Sensation intact, DTR normal. Strength 5/5 in all 4.  Resting tremors in upper extremities Psychiatric: Normal judgment and insight. Alert and oriented x 3. Normal mood.  SKIN/catheters: no rashes, lesions, ulcers. No induration  Labs on Admission: I have personally reviewed following labs and imaging studies  CBC: Recent Labs  Lab 01/13/21 1221  WBC 8.3  NEUTROABS 4.9  HGB 14.8  HCT 42.2  MCV 94.0  PLT 561   Basic Metabolic Panel: Recent Labs  Lab 01/13/21 1221  NA 139  K 4.0  CL 109  CO2 23  GLUCOSE 99  BUN 17  CREATININE 1.02*  CALCIUM 9.8   GFR: CrCl cannot be calculated (Unknown ideal weight.). Recent Labs  Lab 01/13/21 1221  WBC 8.3   Liver Function Tests: Recent Labs  Lab 01/13/21 1221  AST 23  ALT 16  ALKPHOS 65  BILITOT 1.6*  PROT 6.7  ALBUMIN 4.0   No results for input(s): LIPASE, AMYLASE in the last 168 hours. No results for input(s): AMMONIA in the last 168 hours. Coagulation Profile: Recent Labs  Lab 01/13/21 1221  INR 2.5*   Cardiac Enzymes: No results for input(s): CKTOTAL, CKMB, CKMBINDEX, TROPONINI in the last 168 hours. BNP (last 3 results) No results for input(s): PROBNP in the last 8760 hours. HbA1C: No results for input(s): HGBA1C in the last 72 hours. CBG: No results for input(s): GLUCAP in the last 168 hours. Lipid Profile: No results for input(s): CHOL, HDL, LDLCALC, TRIG, CHOLHDL, LDLDIRECT in the last 72 hours. Thyroid Function Tests: Recent Labs    01/13/21 1220  TSH 1.305   Anemia Panel: No results for input(s): VITAMINB12, FOLATE, FERRITIN, TIBC, IRON, RETICCTPCT in the last 72 hours. Urine analysis:    Component Value Date/Time   COLORURINE YELLOW 01/13/2021 1325   APPEARANCEUR CLEAR 01/13/2021 1325   LABSPEC 1.009 01/13/2021 1325   PHURINE 7.0 01/13/2021 1325   GLUCOSEU NEGATIVE 01/13/2021 1325   GLUCOSEU NEGATIVE 08/07/2020 1200   HGBUR NEGATIVE 01/13/2021 1325   BILIRUBINUR  NEGATIVE 01/13/2021 1325   BILIRUBINUR neg 08/07/2020 1115   KETONESUR NEGATIVE 01/13/2021 1325   PROTEINUR NEGATIVE 01/13/2021 1325   UROBILINOGEN 0.2 08/07/2020 1200   UROBILINOGEN 0.2 08/07/2020 1115   NITRITE NEGATIVE 01/13/2021 1325   LEUKOCYTESUR NEGATIVE 01/13/2021 1325    Radiological Exams on Admission: Personally reviewed  DG Chest 2 View  Result Date: 01/13/2021 CLINICAL DATA:  Chest pain EXAM: CHEST - 2 VIEW COMPARISON:  01/19/19 FINDINGS: Stable cardiomediastinal contours. Large hiatal hernia is again noted. No pleural effusion or edema. No airspace densities identified. Unchanged, chronic upper lung zone predominant interstitial coarsening. The visualized osseous structures are unremarkable. IMPRESSION: 1. No acute cardiopulmonary abnormalities. 2. Large hiatal hernia. Electronically Signed   By: Kerby Moors M.D.   On: 01/13/2021 12:31    EKG: Independently reviewed.  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  Chest discomfort: Seen by cardiology who felt her complaints are not consistent with cardiac source.  Likely related to large hiatal hernia as seen on chest x-ray.  She probably has GERD related symptoms in the setting of hiatal hernia.  EKG as above.  Troponin flat.  No further cardiac work-up suggested by cardiology.  Her record indicates history of heart failure, had normal EF per NST in 2018  2.  Dizzy spells: Patient noted to be orthostatic with systolic 811B and heart rate 90s on laying, systolic 147W and heart rate 120s on standing.  She does have prominent veins in lower  extremities suggesting venous insufficiency predisposing her to orthostasis.  Recommended support stockings-patient states she tried them before and made her uncomfortable especially with bilateral knee arthritis and chronic lower extremity tenderness.  She is willing to try TED hose while here.  Avoid medications that predispose to orthostasis-nitrates, amlodipine, clonidine etc. Physical therapy evaluation for safe ambulation.  3.  Chronic atrial fibrillation: Patient reports waking up with palpitations at times which improved after taking morning medications.  Monitor on telemetry here.  Resume beta-blockers and Coumadin.  INR therapeutic currently.  4.  Hiatal hernia/GERD: Likely contributing to chest discomfort as described above.  PPI/Carafate.  5.  Bilateral upper extremity tingling/numbness: Likely related to arthritis as described by patient versus neuropathy.  Check B12, B1, vitamin D, TSH levels.  Can try low-dose Neurontin to help with tremors, anxiety, neuropathic pain.  6.  Hypertension: Resume antihypertensives, avoid venodilator's as described above    DVT prophylaxis: On Coumadin  COVID screen: Pending  Code Status: Full code.Health care proxy would be her daughter and son  Patient/Family Communication: Discussed with patient and all questions answered to satisfaction.  Consults called: Cardiology Admission status :Patient will be admitted under OBSERVATION status.The patient's presenting symptoms, physical exam findings, and initial radiographic and laboratory data in the context of their medical condition is felt to place them at low risk for further clinical deterioration. Furthermore, it is anticipated that the patient will be medically stable for discharge from the hospital within 2 midnights of hospital stay.       Guilford Shi MD [facility] Pager in [place]  If 7PM-7AM, please contact night-coverage [email or web address]   01/13/2021, 6:29 PM

## 2021-01-13 NOTE — ED Notes (Signed)
Introduced myself to pt. Pt tried to use BSC without success. Pt AxO x4. VSS. GCS 15. Denies further needs.

## 2021-01-13 NOTE — ED Notes (Signed)
Daughter Alise Harps [phone] would like an update

## 2021-01-13 NOTE — Consult Note (Signed)
Cardiology Consultation:   Patient ID: Charlotte Castro MRN: [id number]; DOB: 1933/01/19  Admit date: 01/13/2021 Date of Consult: 01/13/2021  PCP:  Isaac Bliss, Rayford Halsted, MD   [facility] Providers Cardiologist:  Larae Grooms, MD   atria     Patient Profile:   Charlotte Castro is a 87 y.o. female with a hx of atrial fib, HF and HTN who is being seen 01/13/2021 for the evaluation of chest pain  at the request of Dr. Matilde Sprang.  History of Present Illness:   Ms. Trojan she presents with weakness and presyncope. She reports not feeling well for several months.  She says that she has been dizzy for years.  More recently her legs have been giving out.  She describes having lived in a few nursing homes recently but she really wanted to be independent on her own.  Son checks on her.  Her daughter comes from [place] a couple times a week to get her groceries.  The daughter was there today and her mom is describing weakness.  She apparently normally gets around with a walker but it sounds like she has had more trouble with mobility.  Her legs have been weak and aching.  She has not had any frank syncope.  She gets these episodes of what sounds like presyncope.  She is not describing her heart racing or skipping.  She is describing a lower chest or upper abdominal wave of discomfort that just kind of ascends up her chest and then down into her arms and hands.  She says her throat and tongue gets numb.  Arms get numb.  He has a resting tremor.  She is not describing chest pressure.  She is not describing new shortness of breath, PND or orthopnea.  EKG demonstrates no acute disease.    BNP was minimally elevated and troponin x 2 was negative.    She had a cardiac cath in 2012 with normal coronaries.  She had a perfusion study   Past Medical History:  Diagnosis Date   Anemia    history of   Anxiety    Arthritis    "left knee" (04/05/2017)   Atrial fibrillation (HCC)    Atrial flutter ([place])     typical appearing   Atrial tachycardia ([place])    ablated 11/17/10  by JA  from the [facility] of the aorta   Chest pain on exertion    CHF (congestive heart failure) (HCC)    Chronic lower back pain    Chronic nausea    Dizziness    chronic and of an unclear etiology   Dyspnea    Endometrial cancer (HCC)    grade 1   Gallstone pancreatitis    Gastritis    GERD (gastroesophageal reflux disease)    History of blood transfusion ~ 1948   History of hiatal hernia    HTN (hypertension)    Hyperlipemia    Hypothyroidism    Left leg numbness    Obesity    Osteoporosis    PMB (postmenopausal bleeding)    Sinus headache    Toe ulcer ([place])    left 3rd toe   Vitamin D deficiency     Past Surgical History:  Procedure Laterality Date   ATRIAL ABLATION SURGERY  11/17/10   Atrial tachycardia arising from [facility] of the aorta ablated by [place]  01/11/2011   Archie Endo 01/12/2011   CHOLECYSTECTOMY N/A 04/08/2017   Procedure:  LAPAROSCOPIC CHOLECYSTECTOMY;  Surgeon: Georganna Skeans, MD;  Location: [place];  Service: General;  Laterality: N/A;   FRACTURE SURGERY     HYSTEROSCOPY WITH D & C N/A 08/05/2017   Procedure: DILATATION AND CURETTAGE /HYSTEROSCOPY AND POLYPECTOMY;  Surgeon: Osborne Oman, MD;  Location: [place] [facility];  Service: Gynecology;  Laterality: N/A;   LUMBAR SPINE SURGERY  ~ [place] SALPINGO OOPHERECTOMY Bilateral 09/20/2017   Procedure: XI ROBOTIC ASSISTED TOTAL HYSTERECTOMY WITH BILATERAL SALPINGO OOPHORECTOMY, SENTINAL LYMPH NODE BIOPSY;  Surgeon: Everitt Amber, MD;  Location: [facility];  Service: Gynecology;  Laterality: Bilateral;   SHOULDER SURGERY Right 1984 X2   /ntoes 01/19/2011   WRIST FRACTURE SURGERY Left 1983     Home Medications:  Prior to Admission medications   Medication Sig Start Date End Date Taking? Authorizing Provider  acetaminophen (TYLENOL) 325 MG tablet Take 650 mg by mouth daily.     [provider]  ciprofloxacin (CIPRO) 500 MG tablet Take 1 tablet (500 mg total) by mouth 2 (two) times daily. 08/07/20   Isaac Bliss, Rayford Halsted, MD  levothyroxine (SYNTHROID) 88 MCG tablet TAKE ONE TABLET DAILY BEFORE BREAKFAST 09/19/20   Isaac Bliss, Rayford Halsted, MD  metoprolol tartrate (LOPRESSOR) 50 MG tablet TAKE ONE TABLET TWICE DAILY WITH A MEAL 12/18/20   Jettie Booze, MD  vitamin B-12 (CYANOCOBALAMIN) 1000 MCG tablet Take 500 mcg by mouth 2 (two) times daily.    [provider]  warfarin (COUMADIN) 5 MG tablet TAKE AS DIRECTED BY COUMADIN CLINIC 12/18/20   Jettie Booze, MD    Inpatient Medications: Scheduled Meds:  Continuous Infusions:  PRN Meds:   Allergies:    Allergies  Allergen Reactions   Iodinated Diagnostic Agents Shortness Of Breath and Other (See Comments)    "Allergic," per MAR- Causes headaches, also   Zosyn [Piperacillin Sod-Tazobactam So] Other (See Comments)    "Allergic," per [facility]   Penicillins Other (See Comments)    Tolerated Zosyn Oct 2018, but "Allergic," per [facility] Did it involve swelling of the face/tongue/throat, SOB, or low BP? Unk Did it involve sudden or severe rash/hives, skin peeling, or any reaction on the inside of your mouth or nose? Unk Did you need to seek medical attention at a hospital or doctor's office? Unk When did it last happen? Unk If all above answers are "NO", may proceed with cephalosporin use.     Social History:   Social History   Socioeconomic History   Marital status: Widowed    Spouse name: Not on file   Number of children: 2   Years of education: 17   Highest education level: Not on file  Occupational History   Not on file  Tobacco Use   Smoking status: Never   Smokeless tobacco: Never  Vaping Use   Vaping Use: Never used  Substance and Sexual Activity   Alcohol use: No   Drug use: No   Sexual activity: Never    Birth control/protection: Post-menopausal  Other Topics Concern    Not on file  Social History Narrative   Lives alone in a one story home.  Has 2 children.  Retired.  Education: high school.     Social Determinants of Health   Financial Resource Strain: Not on file  Food Insecurity: Not on file  Transportation Needs: Not on file  Physical Activity: Not on file  Stress: Not on file  Social Connections: Not on file  Intimate Partner  Violence: Not on file    Family History:    Family History  Problem Relation Age of Onset   Heart attack Father    Hypertension Father      ROS:  Please see the history of present illness.   All other ROS reviewed and negative.     Physical Exam/Data:   Vitals:   01/13/21 1300 01/13/21 1315 01/13/21 1330 01/13/21 1345  BP: (!) 151/76 (!) 150/75 (!) 184/83 (!) 149/90  Pulse: 72 73 79 76  Resp: (!) 21 19 (!) 22 14  Temp:      TempSrc:      SpO2: 99% 100% 100% 96%   No intake or output data in the 24 hours ending 01/13/21 1612 Last 3 Weights 08/07/2020 08/05/2020 05/21/2020  Weight (lbs) 200 lb 12.8 oz 187 lb 198 lb  Weight (kg) 91.082 kg 84.823 kg 89.812 kg     There is no height or weight on file to calculate BMI.  General:  Well nourished, well developed, in no acute distress HEENT: normal Lymph: no adenopathy Neck: no JVD Endocrine:  No thryomegaly Vascular: No carotid bruits; FA pulses 2+ bilaterally without bruits  Cardiac:  normal S1, S2; irregular RR; no murmur  Lungs:  clear to auscultation bilaterally, no wheezing, rhonchi or rales  Abd: soft, nontender, no hepatomegaly  Ext: no edema Musculoskeletal:  No deformities, BUE and BLE strength normal and equal Skin: warm and dry  Neuro:  CNs 2-12 intact, no focal abnormalities noted Psych:  Normal affect   EKG:  The EKG was personally reviewed and demonstrates: Atrial fibrillation, rate 101, axis within normal limits, intervals within normal limits, no acute ST-T wave changes. Telemetry:  Telemetry was personally reviewed and demonstrates:     Relevant CV Studies: None  Laboratory Data:  High Sensitivity Troponin:   Recent Labs  Lab 01/13/21 1221 01/13/21 1425  TROPONINIHS 16 16     Chemistry Recent Labs  Lab 01/13/21 1221  NA 139  K 4.0  CL 109  CO2 23  GLUCOSE 99  BUN 17  CREATININE 1.02*  CALCIUM 9.8  GFRNONAA 54*  ANIONGAP 7    Recent Labs  Lab 01/13/21 1221  PROT 6.7  ALBUMIN 4.0  AST 23  ALT 16  ALKPHOS 65  BILITOT 1.6*   Hematology Recent Labs  Lab 01/13/21 1221  WBC 8.3  RBC 4.49  HGB 14.8  HCT 42.2  MCV 94.0  MCH 33.0  MCHC 35.1  RDW 13.4  PLT 186   BNP Recent Labs  Lab 01/13/21 1221  BNP 125.5*    DDimer No results for input(s): DDIMER in the last 168 hours.   Radiology/Studies:  No results found.   Assessment and Plan:   ATRIAL FIB:  Permanent.  She tolerates anticoagulation.  She has therapeutic levels.  At this point I do not think this is contributing to her symptoms.  However I will be suggesting follow-up as below.  CHEST PAIN: Cardiac enzymes were negative.  EKG was nonacute.  She is really not describing angina to me.  She has an atypical sensations including some sensation in her job but I do not think it represents an acute coronary syndrome.  No further inpatient cardiac work-up is suggested.  DIZZINESS: She seems to have generalized failure to thrive and weakness.  She has a resting tremor.  I do not think there is a cardiac etiology but I would have her had a 2-week event monitor which we can arrange.  She thinks her son would be able to help her with this.   Risk Assessment/Risk Scores:          CHA2DS2-VASc Score =   4 This indicates a  % annual risk of stroke. The patient's score is based upon:           For questions or updates, please contact [place] Please consult [email or web address] for contact info under    Signed, Minus Breeding, MD  01/13/2021 4:12 PM

## 2021-01-13 NOTE — Progress Notes (Signed)
ANTICOAGULATION CONSULT NOTE - Initial Consult  Pharmacy Consult for Warfarin Indication: atrial fibrillation  Allergies  Allergen Reactions   Iodinated Diagnostic Agents Shortness Of Breath and Other (See Comments)    "Allergic," per [facility]- Causes headaches, also   Zosyn [Piperacillin Sod-Tazobactam So] Other (See Comments)    "Allergic," per [facility]   Penicillins Other (See Comments)    Tolerated Zosyn Oct 2018, but "Allergic," per [facility] Did it involve swelling of the face/tongue/throat, SOB, or low BP? Unk Did it involve sudden or severe rash/hives, skin peeling, or any reaction on the inside of your mouth or nose? Unk Did you need to seek medical attention at a hospital or doctor's office? Unk When did it last happen? Unk If all above answers are "NO", may proceed with cephalosporin use.     Vital Signs: Temp: 98.4 F (36.9 C) (08/02 0832) Temp Source: Oral (08/02 0832) BP: 145/92 (08/02 1710) Pulse Rate: 98 (08/02 1710)  Labs: Recent Labs    01/13/21 1221 01/13/21 1425  HGB 14.8  --   HCT 42.2  --   PLT 186  --   LABPROT 27.4*  --   INR 2.5*  --   CREATININE 1.02*  --   TROPONINIHS 16 16    CrCl cannot be calculated (Unknown ideal weight.).   Medical History: Past Medical History:  Diagnosis Date   Anemia    history of   Anxiety    Arthritis    "left knee" (04/05/2017)   Atrial fibrillation (HCC)    Atrial flutter ([place])    typical appearing   Atrial tachycardia ([place])    ablated 11/17/10  by JA  from the [facility] of the aorta   CHF (congestive heart failure) (HCC)    Chronic lower back pain    Chronic nausea    Endometrial cancer (HCC)    grade 1   Gallstone pancreatitis    Gastritis    GERD (gastroesophageal reflux disease)    History of hiatal hernia    HTN (hypertension)    Hyperlipemia    Hypothyroidism    Obesity    Osteoporosis    Toe ulcer ([place])    left 3rd toe   Vitamin D deficiency     Assessment: 87 YO female who presented with chest pain  and has a hx of permanent atrial fibrillation, HF, and HTN. Her home warfarin dose is 2.5 mg on Tuesday and Saturday, 5 mg all other days. Patient took dose prior to admission at 8/2 AM. Need medication reconciliation to verify home dose with daughter. INR is 2.5.  Goal of Therapy:  INR 2-3 Monitor platelets by anticoagulation protocol: Yes   Plan:  Follow-up med rec and INR in the morning to determine next dose. Monitor daily INR and signs and symptoms of bleeding.   Varney Daily, PharmD PGY1 Pharmacy Resident  Please check AMION for all [facility] pharmacy phone numbers After 10:00 PM call main pharmacy [phone]

## 2021-01-13 NOTE — ED Provider Notes (Signed)
[facility] EMERGENCY DEPARTMENT Provider Note   CSN: [id number] Arrival date & time: 01/13/21  [id number]     History No chief complaint on file.   Charlotte Castro is a 87 y.o. female with PMH A. fib on Coumadin, CHF, HTN, recurrent gallstone pancreatitis status postcholecystectomy in 2018, known large hiatal hernia who presents to the emergency department for evaluation of presyncope and chest pain.  She states that over the last 72 hours she has had multiple episodes of chest discomfort that radiates up into the jaw with associated tongue numbness and jaw numbness.  She states that these episodes happen with exertion and at rest and she "feels like she is going to die".  She states that she has had progressive generalized weakness over this time frame and feels that her "family is working her too hard".  She states that she feels unsteady on her feet and is worried she is going to fall.  Denies nausea, vomiting, dysuria, increased frequency, fever, cough or other systemic symptoms.  HPI     Past Medical History:  Diagnosis Date   Anemia    history of   Anxiety    Arthritis    "left knee" (04/05/2017)   Atrial fibrillation (HCC)    Atrial flutter ([place])    typical appearing   Atrial tachycardia ([place])    ablated 11/17/10  by JA  from the [facility] of the aorta   Chest pain on exertion    CHF (congestive heart failure) (HCC)    Chronic lower back pain    Chronic nausea    Dizziness    chronic and of an unclear etiology   Dyspnea    Endometrial cancer (HCC)    grade 1   Gallstone pancreatitis    Gastritis    GERD (gastroesophageal reflux disease)    History of blood transfusion ~ 1948   History of hiatal hernia    HTN (hypertension)    Hyperlipemia    Hypothyroidism    Left leg numbness    Obesity    Osteoporosis    PMB (postmenopausal bleeding)    Sinus headache    Toe ulcer ([place])    left 3rd toe   Vitamin D deficiency     Patient Active Problem List    Diagnosis Date Noted   Abdominal pain    Acute cystitis without hematuria    Hiatal hernia 01/29/2019   AKI (acute kidney injury) ([place]) 01/14/2019   B12 deficiency 08/08/2018   Hypothyroidism 08/08/2018   Endometrial cancer ([place]) 08/05/2017   Toe ulcer, left, with unspecified severity ([place]) 08/04/2017   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 ([place]) 08/04/2017   Postmenopausal bleeding 08/01/2017   Epigastric abdominal pain 04/05/2017   Delirium 04/14/2016   Tremor 04/14/2016   Chronic diastolic CHF (congestive heart failure) ([place]) 04/10/2016   Non-intractable vomiting 10/09/2015   Essential hypertension, benign 01/18/2014   Long term current use of anticoagulant therapy 01/18/2014   Permanent atrial fibrillation ([place]) 06/10/2011    Past Surgical History:  Procedure Laterality Date   ATRIAL ABLATION SURGERY  11/17/10   Atrial tachycardia arising from [facility] of the aorta ablated by [place]  01/11/2011   Archie Endo 01/12/2011   CHOLECYSTECTOMY N/A 04/08/2017   Procedure: LAPAROSCOPIC CHOLECYSTECTOMY;  Surgeon: Georganna Skeans, MD;  Location: [facility];  Service: General;  Laterality: N/A;   FRACTURE SURGERY  HYSTEROSCOPY WITH D & C N/A 08/05/2017   Procedure: DILATATION AND CURETTAGE /HYSTEROSCOPY AND POLYPECTOMY;  Surgeon: Osborne Oman, MD;  Location: [facility];  Service: Gynecology;  Laterality: N/A;   LUMBAR SPINE SURGERY  ~ [place] SALPINGO OOPHERECTOMY Bilateral 09/20/2017   Procedure: XI ROBOTIC ASSISTED TOTAL HYSTERECTOMY WITH BILATERAL SALPINGO OOPHORECTOMY, SENTINAL LYMPH NODE BIOPSY;  Surgeon: Everitt Amber, MD;  Location: [facility];  Service: Gynecology;  Laterality: Bilateral;   SHOULDER SURGERY Right 1984 X2   /ntoes 01/19/2011   WRIST FRACTURE SURGERY Left 1983     OB History     Gravida  3   Para      Term      Preterm      AB  1   Living         SAB   1   IAB      Ectopic      Multiple      Live Births              Family History  Problem Relation Age of Onset   Heart attack Father    Hypertension Father     Social History   Tobacco Use   Smoking status: Never   Smokeless tobacco: Never  Vaping Use   Vaping Use: Never used  Substance Use Topics   Alcohol use: No   Drug use: No    Home Medications Prior to Admission medications   Medication Sig Start Date End Date Taking? Authorizing Provider  acetaminophen (TYLENOL) 325 MG tablet Take 650 mg by mouth daily.    [provider]  ciprofloxacin (CIPRO) 500 MG tablet Take 1 tablet (500 mg total) by mouth 2 (two) times daily. 08/07/20   Isaac Bliss, Rayford Halsted, MD  levothyroxine (SYNTHROID) 88 MCG tablet TAKE ONE TABLET DAILY BEFORE BREAKFAST 09/19/20   Isaac Bliss, Rayford Halsted, MD  metoprolol tartrate (LOPRESSOR) 50 MG tablet TAKE ONE TABLET TWICE DAILY WITH A MEAL 12/18/20   Jettie Booze, MD  vitamin B-12 (CYANOCOBALAMIN) 1000 MCG tablet Take 500 mcg by mouth 2 (two) times daily.    [provider]  warfarin (COUMADIN) 5 MG tablet TAKE AS DIRECTED BY COUMADIN CLINIC 12/18/20   Jettie Booze, MD    Allergies    Iodinated diagnostic agents, Zosyn [piperacillin sod-tazobactam so], and Penicillins  Review of Systems   Review of Systems  Constitutional:  Negative for chills and fever.  HENT:  Negative for ear pain and sore throat.   Eyes:  Negative for pain and visual disturbance.  Respiratory:  Negative for cough and shortness of breath.   Cardiovascular:  Positive for chest pain. Negative for palpitations.  Gastrointestinal:  Negative for abdominal pain and vomiting.  Genitourinary:  Negative for dysuria and hematuria.  Musculoskeletal:  Negative for arthralgias and back pain.  Skin:  Negative for color change and rash.  Neurological:  Positive for tremors, syncope (preSyncope), weakness (Generalized) and numbness. Negative for  seizures.  All other systems reviewed and are negative.  Physical Exam Updated Vital Signs BP (!) 149/90   Pulse 76   Temp 98.4 F (36.9 C) (Oral)   Resp 14   SpO2 96%   Physical Exam Vitals and nursing note reviewed.  Constitutional:      General: She is not in acute distress.    Appearance: She is well-developed.  HENT:     Head: Normocephalic and atraumatic.  Eyes:  Conjunctiva/sclera: Conjunctivae normal.  Cardiovascular:     Rate and Rhythm: Normal rate and regular rhythm.     Heart sounds: No murmur heard. Pulmonary:     Effort: Pulmonary effort is normal. No respiratory distress.     Breath sounds: Normal breath sounds.  Abdominal:     Palpations: Abdomen is soft.     Tenderness: There is no abdominal tenderness.  Musculoskeletal:     Cervical back: Neck supple.  Skin:    General: Skin is warm and dry.  Neurological:     General: No focal deficit present.     Mental Status: She is alert.     Cranial Nerves: No cranial nerve deficit.     Sensory: No sensory deficit.     Motor: No weakness.    ED Results / Procedures / Treatments   Labs (all labs ordered are listed, but only abnormal results are displayed) Labs Reviewed  COMPREHENSIVE METABOLIC PANEL - Abnormal; Notable for the following components:      Result Value   Creatinine, Ser 1.02 (*)    Total Bilirubin 1.6 (*)    GFR, Estimated 54 (*)    All other components within normal limits  BRAIN NATRIURETIC PEPTIDE - Abnormal; Notable for the following components:   B Natriuretic Peptide 125.5 (*)    All other components within normal limits  PROTIME-INR - Abnormal; Notable for the following components:   Prothrombin Time 27.4 (*)    INR 2.5 (*)    All other components within normal limits  URINALYSIS, ROUTINE W REFLEX MICROSCOPIC  CBC WITH DIFFERENTIAL/PLATELET  TSH  TROPONIN I (HIGH SENSITIVITY)  TROPONIN I (HIGH SENSITIVITY)    EKG EKG Interpretation  Date/Time:  Tuesday January 13 2021  08:24:33 EDT Ventricular Rate:  101 PR Interval:  132 QRS Duration: 74 QT Interval:  326 QTC Calculation: 422 R Axis:   40 Text Interpretation: Interpretation limited secondary to artifact Sinus tachycardia Cannot rule out Anterior infarct , age undetermined Abnormal ECG Since previous tracing Sinus tachycardia Confirmed by [place], Will [phone]) on 01/13/2021 1:32:40 PM  Radiology No results found.  Procedures Procedures   Medications Ordered in ED Medications - No data to display  ED Course  I have reviewed the triage vital signs and the nursing notes.  Pertinent labs & imaging results that were available during my care of the patient were reviewed by me and considered in my medical decision making (see chart for details).    MDM Rules/Calculators/A&P                           Patient seen in the emergency department for evaluation of presyncope and chest pain.  Physical exam is large unremarkable outside of a resting tremor in the hands and jaw.  Laboratory evaluation is largely unremarkable outside of a BNP of 125.5 which is downtrending from previous.  High-sensitivity troponin unremarkable at 16.  Delta troponin is pending.  Urinalysis unremarkable.  Chest x-ray is unremarkable outside of a large hiatal hernia.  Patient's story is concerning for possible underlying cardiac etiology versus vagal events that put her at high risk for fall.  In the setting of her generalized weakness, the patient would also benefit from a physical therapy evaluation.  Patient will be admitted to medicine for further presyncopal and chest pain work-up. Final Clinical Impression(s) / ED Diagnoses Final diagnoses:  None    Rx / DC Orders ED Discharge Orders  None        Teressa Lower, MD 01/13/21 1420

## 2021-01-13 NOTE — ED Notes (Signed)
Assumed care at this time pt ED hold.

## 2021-01-13 NOTE — ED Triage Notes (Signed)
Pt here from home with c/o weakness and feeling like she passed out , pt had a near syncopal episode on sat and has been having some slight chest pressure

## 2021-01-13 NOTE — ED Notes (Signed)
Pt transported to floor via RN via stretcher.

## 2021-01-14 ENCOUNTER — Observation Stay (HOSPITAL_COMMUNITY): Payer: Medicare Other

## 2021-01-14 DIAGNOSIS — I4821 Permanent atrial fibrillation: Secondary | ICD-10-CM | POA: Diagnosis not present

## 2021-01-14 DIAGNOSIS — R0789 Other chest pain: Secondary | ICD-10-CM

## 2021-01-14 DIAGNOSIS — R079 Chest pain, unspecified: Secondary | ICD-10-CM | POA: Diagnosis not present

## 2021-01-14 DIAGNOSIS — R42 Dizziness and giddiness: Secondary | ICD-10-CM | POA: Diagnosis not present

## 2021-01-14 LAB — PROTIME-INR
INR: 2.4 — ABNORMAL HIGH (ref 0.8–1.2)
Prothrombin Time: 25.9 seconds — ABNORMAL HIGH (ref 11.4–15.2)

## 2021-01-14 IMAGING — MR MR CERVICAL SPINE W/O CM
4 of 5 series · 19 of 48 positions shown · non-contrast
Comparison: None.

CLINICAL DATA: Chronic neck pain

EXAM:
MRI CERVICAL SPINE WITHOUT CONTRAST
TECHNIQUE: Multiplanar, multisequence MR imaging of the cervical spine was
performed. No intravenous contrast was administered.

[Series 3: T1 · sagittal · 3.0mm · 0.43mm/px · 3 of 13 slices shown]
[im 3/13]
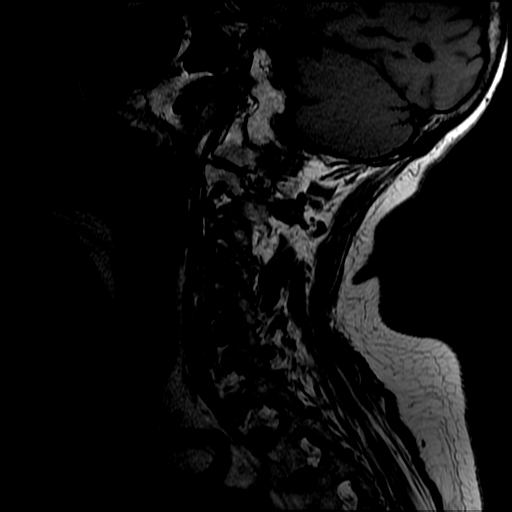
[im 8/13]
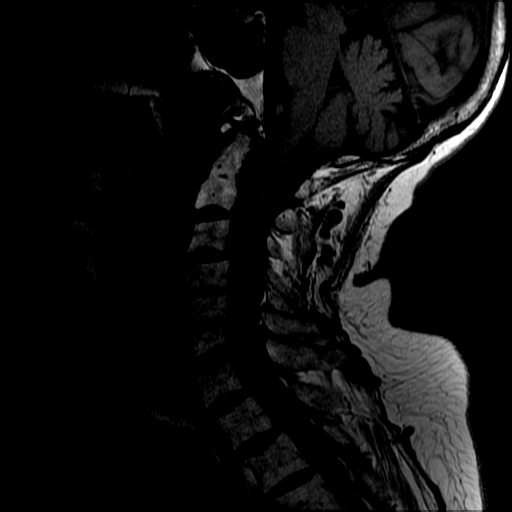
[im 13/13]
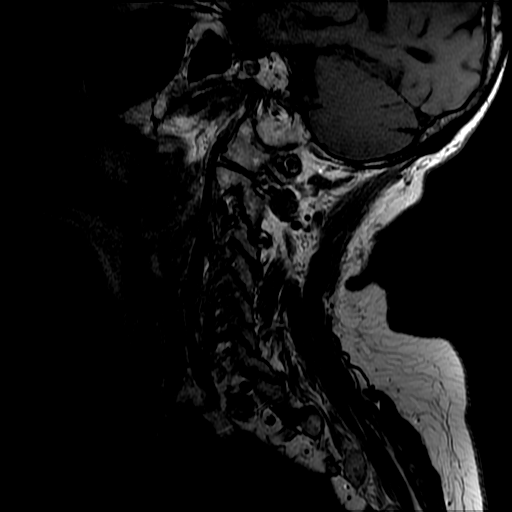

[Series 4: T2 · sagittal · 3.0mm · 0.43mm/px · 6 of 13 slices shown (1 of 2)]
[im 1/13]
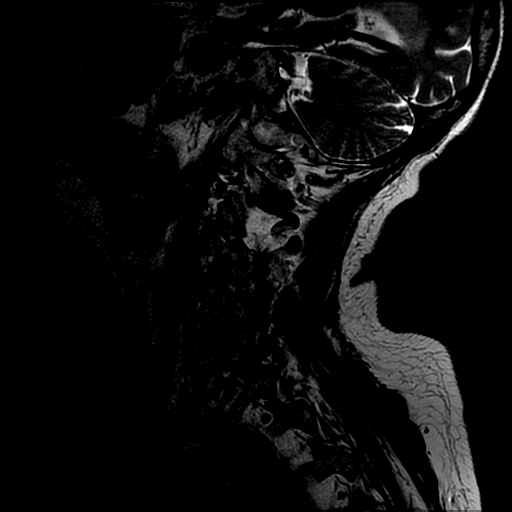
[im 3/13]
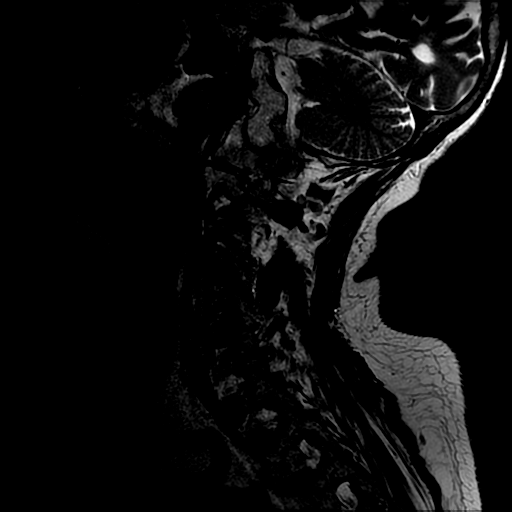
[im 5/13]
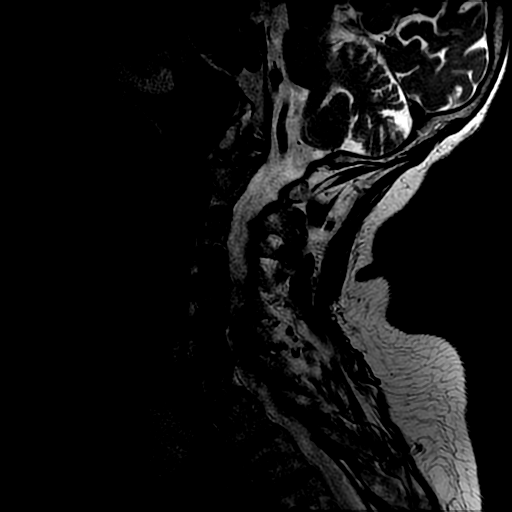
[im 8/13]
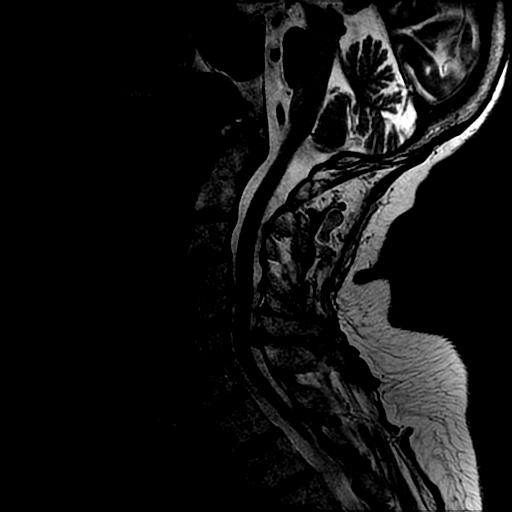
[im 10/13]
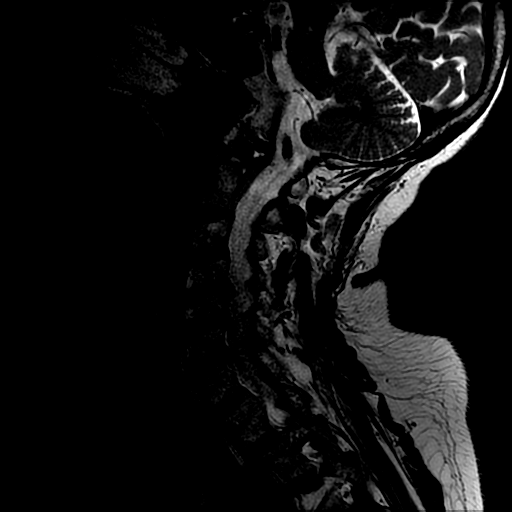
[im 13/13]
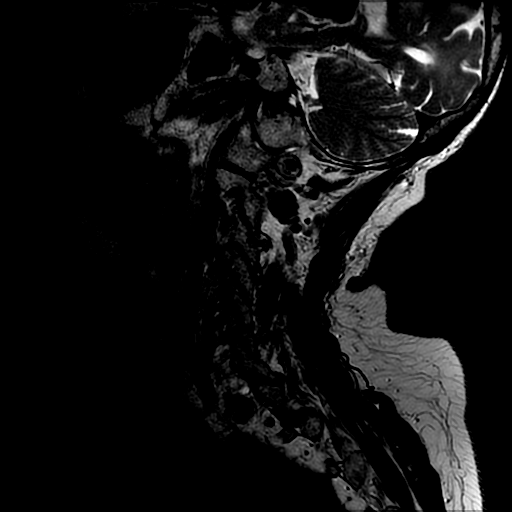

[Series 5: sag ir · sagittal · 3.0mm · 0.43mm/px · 3 of 13 slices shown]
[im 3/13]
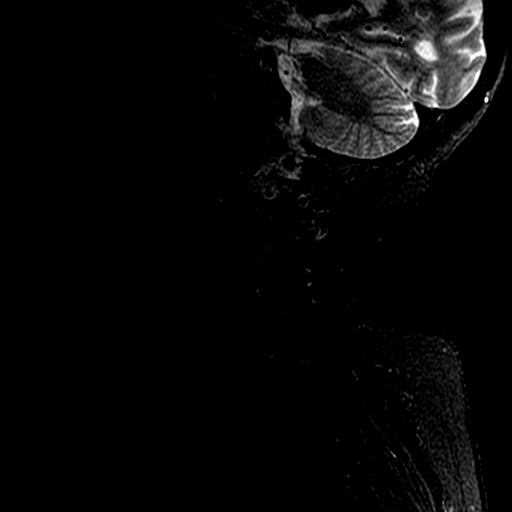
[im 8/13]
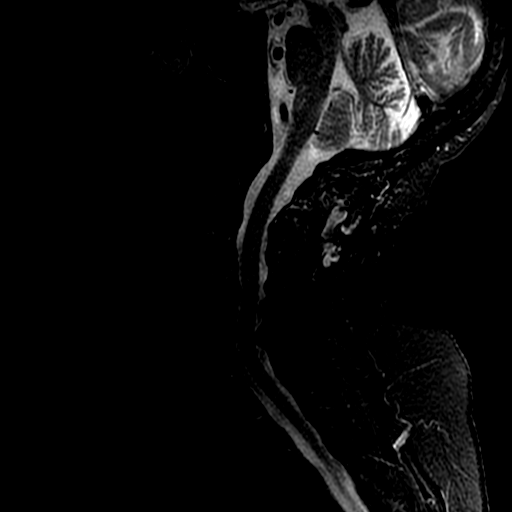
[im 13/13]
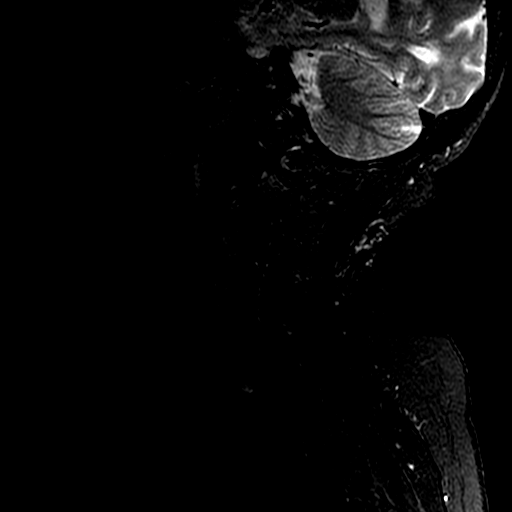

[Series 7: T2 · axial · 3.0mm · 0.39mm/px · z∈[-54,+28]mm · 7 of 31 slices shown (2 of 2)]
[im 1/31]
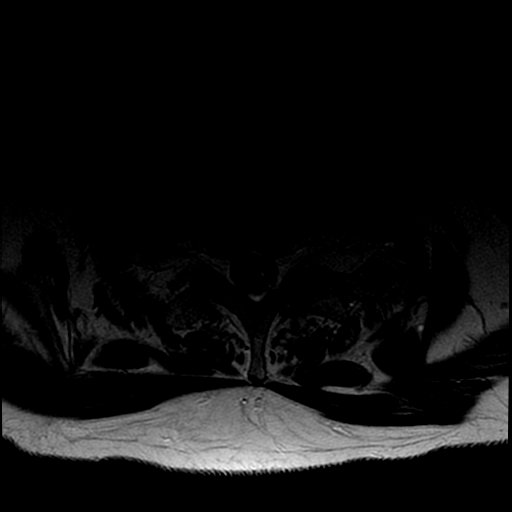
[im 5/31]
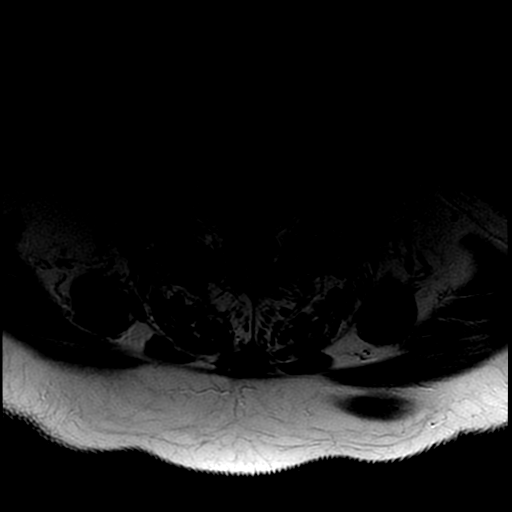
[im 9/31]
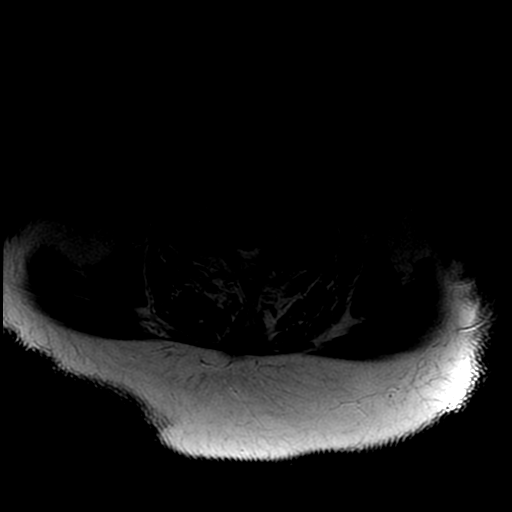
[im 13/31]
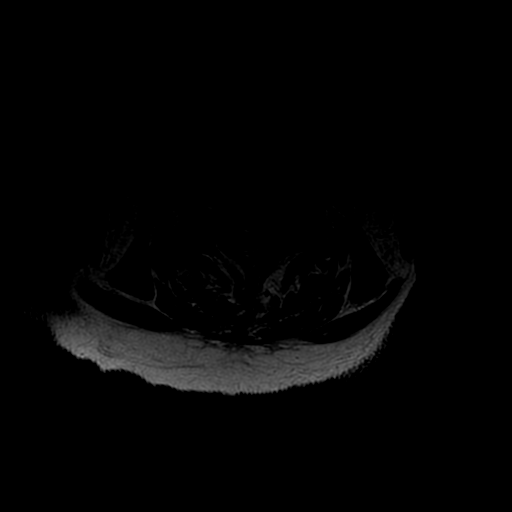
[im 16/31]
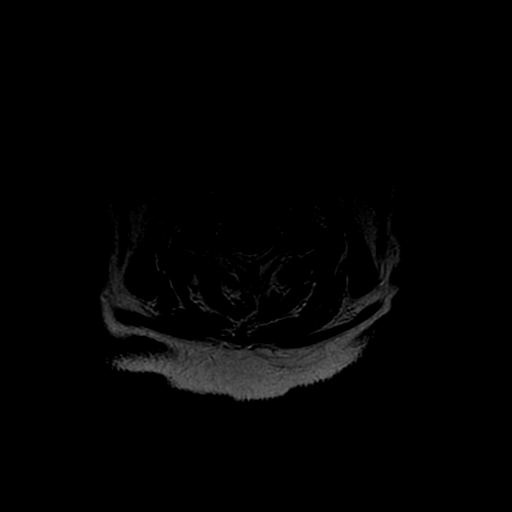
[im 18/31]
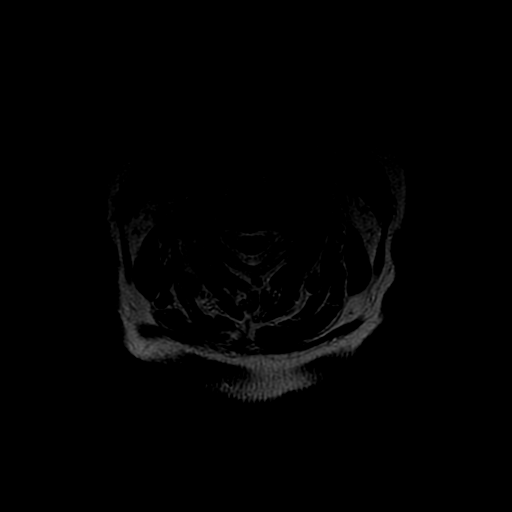
[im 26/31]
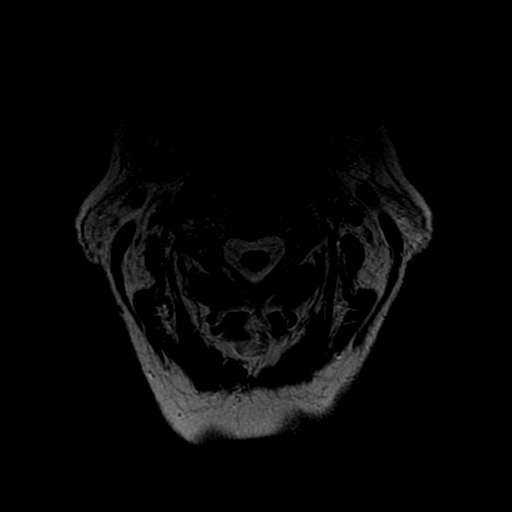

[19 of 48 positions shown; findings below may reference images not displayed]

FINDINGS: Alignment: Slight anterolisthesis C3-4. Mild retrolisthesis C4-5,
C5-6

Vertebrae: Normal bone marrow. Negative for acute fracture or mass.
Probable mild chronic fracture superior endplate of T2.

Cord: Normal signal and morphology

Posterior Fossa, vertebral arteries, paraspinal tissues: Negative

Disc levels:

C2-3: Mild degenerative change.  Negative for stenosis

C3-4: Mild disc and facet degeneration. Mild foraminal narrowing
bilaterally

C4-5: Disc degeneration with diffuse uncinate spurring. Bilateral
facet degeneration. Moderate foraminal encroachment bilaterally

C5-6: Disc degeneration with diffuse uncinate spurring. Moderate
foraminal stenosis bilaterally

C6-7: Disc degeneration and mild uncinate spurring. Mild foraminal
narrowing bilaterally

C7-T1: Negative
IMPRESSION: Multilevel disc degeneration and spurring in the cervical spine.
Spinal and foraminal stenosis as above.

Negative for acute fracture or mass.

## 2021-01-14 MED ORDER — VITAMIN D (ERGOCALCIFEROL) 1.25 MG (50000 UNIT) PO CAPS
50000.0000 [IU] | ORAL_CAPSULE | ORAL | Status: DC
  Administered 2021-01-14: 50000 [IU] via ORAL
  Filled 2021-01-14: qty 1

## 2021-01-14 MED ORDER — GABAPENTIN 100 MG PO CAPS: 100.0000 mg | ORAL_CAPSULE | Freq: Two times a day (BID) | ORAL | 0 refills | Status: DC

## 2021-01-14 MED ORDER — TRAMADOL HCL 50 MG PO TABS
50.0000 mg | ORAL_TABLET | Freq: Two times a day (BID) | ORAL | Status: DC | PRN
  Administered 2021-01-14: 50 mg via ORAL
  Filled 2021-01-14: qty 1

## 2021-01-14 MED ORDER — WARFARIN - PHARMACIST DOSING INPATIENT: Freq: Every day | Status: DC

## 2021-01-14 MED ORDER — WARFARIN SODIUM 5 MG PO TABS
5.0000 mg | ORAL_TABLET | Freq: Once | ORAL | Status: AC
  Administered 2021-01-14: 5 mg via ORAL
  Filled 2021-01-14: qty 1

## 2021-01-14 MED ORDER — PANTOPRAZOLE SODIUM 40 MG PO TBEC: 40.0000 mg | DELAYED_RELEASE_TABLET | Freq: Every day | ORAL | 0 refills | Status: DC

## 2021-01-14 MED ORDER — VITAMIN D (ERGOCALCIFEROL) 1.25 MG (50000 UNIT) PO CAPS: 50000.0000 [IU] | ORAL_CAPSULE | ORAL | 0 refills | Status: AC

## 2021-01-14 NOTE — Progress Notes (Addendum)
Progress Note  Patient Name: Charlotte Castro Date of Encounter: 01/14/2021  [facility] Cardiologist: Charlotte Grooms, MD   Subjective   Patient states she has chronic intermittent SOB, especially when she moves around. She is a little SOB this AM. She states she has been having a headache on top of her head , radiating down occipital lobe to her neck area. She feels stiff with vision felt blurry. She states she does not normally get headache like this. She states her chest pain is resolved, still feeling dizzy.   Inpatient Medications    Scheduled Meds:  gabapentin  100 mg Oral BID   levothyroxine  88 mcg Oral Q0600   metoprolol tartrate  25 mg Oral BID   pantoprazole  40 mg Oral Daily   sucralfate  1 g Oral BID   vitamin B-12  500 mcg Oral BID   Vitamin D (Ergocalciferol)  50,000 Units Oral Q Wed   Continuous Infusions:  PRN Meds: acetaminophen, ondansetron (ZOFRAN) IV   Vital Signs    Vitals:   01/14/21 0018 01/14/21 0428 01/14/21 0735 01/14/21 0835  BP: (!) 148/89 (!) 147/76 (!) 150/76   Pulse: 73 76 69 69  Resp: '16 18 18   '$ Temp: 97.6 F (36.4 C) 97.9 F (36.6 C) 97.7 F (36.5 C)   TempSrc: Oral Oral Oral   SpO2: 100% 99% 99%   Weight: 90.4 kg     Height: '5\' 9"'$  (1.753 m)      No intake or output data in the 24 hours ending 01/14/21 0837 Last 3 Weights 01/14/2021 08/07/2020 08/05/2020  Weight (lbs) 199 lb 4.7 oz 200 lb 12.8 oz 187 lb  Weight (kg) 90.4 kg 91.082 kg 84.823 kg      Telemetry     A fib with ventricular rate of 80s - Personally Reviewed  ECG    N/A this AM - Personally Reviewed  Physical Exam   GEN: No acute distress.  Frail elderly  Neck: No JVD Cardiac: Irregularly irregular, no murmurs, rubs, or gallops.  Respiratory: Clear to auscultation bilaterally. On room air. Speaks full sentence.  GI: Soft, nontender, non-distended  MS: Trace BLE edema; No deformity. Neuro:  Nonfocal  Psych: Normal affect   Labs    High Sensitivity  Troponin:   Recent Labs  Lab 01/13/21 1221 01/13/21 1425  TROPONINIHS 16 16      Chemistry Recent Labs  Lab 01/13/21 1221  NA 139  K 4.0  CL 109  CO2 23  GLUCOSE 99  BUN 17  CREATININE 1.02*  CALCIUM 9.8  PROT 6.7  ALBUMIN 4.0  AST 23  ALT 16  ALKPHOS 65  BILITOT 1.6*  GFRNONAA 54*  ANIONGAP 7     Hematology Recent Labs  Lab 01/13/21 1221  WBC 8.3  RBC 4.49  HGB 14.8  HCT 42.2  MCV 94.0  MCH 33.0  MCHC 35.1  RDW 13.4  PLT 186    BNP Recent Labs  Lab 01/13/21 1221  BNP 125.5*     DDimer No results for input(s): DDIMER in the last 168 hours.   Radiology    DG Chest 2 View  Result Date: 01/13/2021 CLINICAL DATA:  Chest pain EXAM: CHEST - 2 VIEW COMPARISON:  01/19/19 FINDINGS: Stable cardiomediastinal contours. Large hiatal hernia is again noted. No pleural effusion or edema. No airspace densities identified. Unchanged, chronic upper lung zone predominant interstitial coarsening. The visualized osseous structures are unremarkable. IMPRESSION: 1. No acute cardiopulmonary abnormalities. 2.  Large hiatal hernia. Electronically Signed   By: Charlotte Castro M.D.   On: 01/13/2021 12:31    Cardiac Studies   NM Myoview on 06/10/2017:  1. No reversible ischemia or infarction.   2. Normal left ventricular wall motion.   3. Left ventricular ejection fraction 68%   4. Non invasive risk stratification*: Low  Patient Profile     86 y.o. female with PMH of permanent atrial fib on AC with coumadin, HTN,  chronic dyspnea and fatigue, hypothyroidism, GERD, hiatal hernia, cardiology is consulted for chest discomfort and dizziness.   Assessment & Plan    Chest pain, atypical  - HS trop 16 >16  - EKG no acute ischemic change - CXR no acute findings - stress Myoview negative 2018 and cardiac cath 2012 negative for CAD  - no further ischemic workup indicated  - consider alternative cause of chest discomfort such as MSK, GERD,etc   Permanent A fib - rate  controlled on PO metoprolol '25mg'$  BID  - compliant with anticoagulation with coumadin , INR therapeutic 2.4 today   Dizziness Headache  Blurry vision  - seems having chronic fatigue, dyspnea, weakness - TSH /B12 WNL, Vit D deficient  - mild orthostasis reported, agree with compression stocking  - will arrange 2 week event monitor rule out arrhythmia before discharge  - further workup and mangement per IM    HTN - BP elevated , on metoprolol , overall fair control for her age   For questions or updates, please contact [facility] Please consult [email or web address] for contact info under        Signed, Charlotte Billet, NP  01/14/2021, 8:37 AM    History and all data above reviewed.  Patient examined.  I agree with the findings as above.  She is "working my neck" because this helps with her dizziness.  No chest pain.  No acute SOB. Able to stand and take steps with her nurseThe patient exam reveals COR:RRR  ,  Lungs: Clear  ,  Abd: Positive bowel sounds, no rebound no guarding, Ext No edema  .  All available labs, radiology testing, previous records reviewed. Agree with documented assessment and plan. Dizziness:  Plan for out patient event monitor as above.   Charlotte Castro Charlotte Castro  11:20 AM  01/14/2021

## 2021-01-14 NOTE — Care Management (Signed)
1713 01-14-21 Case Manager spoke with the patient regarding disposition needs. Patient states she lives alone and has support of family. Patient is declining home health services at this time, she states that the home health in the past has not helped her knees. Case Manager encouraged the patient to contact her primary care provider if she needs services in the future. Case Manager made Staff RN aware. No further needs identified at this time.

## 2021-01-14 NOTE — Progress Notes (Signed)
[place] for Warfarin Indication: atrial fibrillation  Allergies  Allergen Reactions   Iodinated Diagnostic Agents Shortness Of Breath and Other (See Comments)    "Allergic," per MAR- Causes headaches, also   Zosyn [Piperacillin Sod-Tazobactam So] Other (See Comments)    "Allergic," per [facility]   Penicillins Other (See Comments)    Tolerated Zosyn Oct 2018, but "Allergic," per [facility] Did it involve swelling of the face/tongue/throat, SOB, or low BP? Unk Did it involve sudden or severe rash/hives, skin peeling, or any reaction on the inside of your mouth or nose? Unk Did you need to seek medical attention at a hospital or doctor's office? Unk When did it last happen? Unk If all above answers are "NO", may proceed with cephalosporin use.     Vital Signs: Temp: 97.7 F (36.5 C) (08/03 0735) Temp Source: Oral (08/03 0735) BP: 150/76 (08/03 0735) Pulse Rate: 69 (08/03 0835)  Labs: Recent Labs    01/13/21 1221 01/13/21 1425 01/14/21 0134  HGB 14.8  --   --   HCT 42.2  --   --   PLT 186  --   --   LABPROT 27.4*  --  25.9*  INR 2.5*  --  2.4*  CREATININE 1.02*  --   --   TROPONINIHS 16 16  --      Estimated Creatinine Clearance: 47.4 mL/min (A) (by C-G formula based on SCr of 1.02 mg/dL (H)).   Medical History: Past Medical History:  Diagnosis Date   Anemia    history of   Anxiety    Arthritis    "left knee" (04/05/2017)   Atrial fibrillation (HCC)    Atrial flutter ([place])    typical appearing   Atrial tachycardia ([place])    ablated 11/17/10  by JA  from the [facility] of the aorta   CHF (congestive heart failure) (HCC)    Chronic lower back pain    Chronic nausea    Endometrial cancer (HCC)    grade 1   Gallstone pancreatitis    Gastritis    GERD (gastroesophageal reflux disease)    History of hiatal hernia    HTN (hypertension)    Hyperlipemia    Hypothyroidism    Obesity    Osteoporosis    Toe ulcer ([place])    left 3rd toe    Vitamin D deficiency     Assessment: 87 YO female who presented with chest pain and has a hx of permanent atrial fibrillation, HF, and HTN. Her home warfarin dose is 2.5 mg on Tuesday and Saturday, 5 mg all other days.  -INR= 2.4  Goal of Therapy:  INR 2-3 Monitor platelets by anticoagulation protocol: Yes   Plan:  -Warfarin '5mg'$  po today -Daily PT/INR  Hildred Laser, PharmD Clinical Pharmacist **Pharmacist phone directory can now be found on [email or web address] ([email or web address]).  Listed under [place].

## 2021-01-14 NOTE — Evaluation (Signed)
Physical Therapy Evaluation Patient Details Name: Charlotte Castro MRN: [id number] DOB: 09-22-1932 Today's Date: 01/14/2021   History of Present Illness  87 y.o. female presents to [facility] ED on 01/13/2021 with reports of chest discomfort and numbness in tongue/throat. Pt also reports dizzy spells with change in position. Pt admitted for pre-syncope workup. PMH includes anemia, OA, Afib, CHF, chronic low back pain, endometrial CA, HTN, HLD.  Clinical Impression  Pt presents to PT with deficits in activity tolerance, gait, balance, strength, power, ROM, and with significant neck pain. Pt reports pain in R shoulder and neck which radiates up to head. BP stable with mobility although pt reports dizziness. Dizziness is reproduced with cervical ROM, no nystagmus noted with eye movements. Pt is able to ambulate without physical assistance, no losses of balance noted. PT encourages use of cane for all out of bed mobility upon return home. PT declines outpatient PT, will benefit from HHPT in an effort to improve neck ROM and reduce neck pain while further reducing falls risk.    Follow Up Recommendations Home health PT (pt refusing outpatient PT for neck pain)    Equipment Recommendations  None recommended by PT    Recommendations for Other Services       Precautions / Restrictions Precautions Precautions: Fall Restrictions Weight Bearing Restrictions: No      Mobility  Bed Mobility Overal bed mobility: Independent                  Transfers Overall transfer level: Needs assistance Equipment used: Quad cane Transfers: Sit to/from Stand Sit to Stand: Supervision            Ambulation/Gait Ambulation/Gait assistance: Supervision Gait Distance (Feet): 100 Feet (additional trial of 50') Assistive device: Quad cane Gait Pattern/deviations: Step-through pattern Gait velocity: functional Gait velocity interpretation: 1.31 - 2.62 ft/sec, indicative of limited community ambulator General  Gait Details: pt with slowed satep-through gait, widened BOS  Stairs            Wheelchair Mobility    Modified Rankin (Stroke Patients Only)       Balance Overall balance assessment: Needs assistance Sitting-balance support: Feet supported;No upper extremity supported Sitting balance-[name]: Good     Standing balance support: Single extremity supported Standing balance-[name]: Poor Standing balance comment: reliant on UE support of quad cane                             Pertinent Vitals/Pain Pain Assessment: 0-10 Pain Score: 9  Pain Location: neck radiating into head Pain Descriptors / Indicators: Aching Pain Intervention(s): Monitored during session    Home Living Family/patient expects to be discharged to:: Private residence Living Arrangements: Alone Available Help at Discharge: Family;Available PRN/intermittently (son in [place], daughter in [place]) Type of Home: House Home Access: [place]: One level Home Equipment: Environmental consultant - 4 wheels;Cane - quad;Cane - single point;Bedside commode;Wheelchair - manual      Prior Function Level of Independence: Needs assistance   Gait / Transfers Assistance Needed: pt reports ambulating with support of furniture often but otherwise utilizes cane  ADL's / Homemaking Assistance Needed: pt does not drive, daughter delivers groceries every other week        Hand Dominance   Dominant Hand: Right    Extremity/Trunk Assessment   Upper Extremity Assessment Upper Extremity Assessment: Generalized weakness    Lower Extremity Assessment Lower Extremity Assessment: Generalized weakness  Cervical / Trunk Assessment Cervical / Trunk Assessment: Kyphotic (pt with significant cervical ROM deficits, pain in all directions of mobility associated with dizziness)  Communication   Communication: No difficulties  Cognition Arousal/Alertness: Awake/alert Behavior During Therapy:  WFL for tasks assessed/performed Overall Cognitive Status: No family/caregiver present to determine baseline cognitive functioning                                 General Comments: likely baseline, appropriate for session      General Comments General comments (skin integrity, edema, etc.): VSS on RA, BP stable. Pt reports dizziness upon initially sitting at edge of bed, worsened with standing however BP stable. No nystagmus noted with pursuits or saccades, VOR assessment limited by neck pain (with reproduction of dizziness).    Exercises     Assessment/Plan    PT Assessment Patient needs continued PT services  PT Problem List Decreased strength;Decreased activity tolerance;Decreased balance;Decreased mobility;Decreased range of motion;Decreased knowledge of use of DME;Pain       PT Treatment Interventions DME instruction;Gait training;Functional mobility training;Therapeutic activities;Therapeutic exercise;Balance training;Patient/family education;Neuromuscular re-education    PT Goals (Current goals can be found in the Care Plan section)  Acute Rehab PT Goals Patient Stated Goal: to reduce pain PT Goal Formulation: With patient Time For Goal Achievement: 01/28/21 Potential to Achieve Goals: Good    Frequency Min 3X/week   Barriers to discharge        Co-evaluation               AM-PAC PT "6 Clicks" Mobility  Outcome Measure Help needed turning from your back to your side while in a flat bed without using bedrails?: None Help needed moving from lying on your back to sitting on the side of a flat bed without using bedrails?: None Help needed moving to and from a bed to a chair (including a wheelchair)?: A Little Help needed standing up from a chair using your arms (e.g., wheelchair or bedside chair)?: A Little Help needed to walk in hospital room?: A Little Help needed climbing 3-5 steps with a railing? : A Little 6 Click Score: 20    End of Session    Activity Tolerance: Patient limited by pain Patient left: in chair;with call bell/phone within reach Nurse Communication: Mobility status PT Visit Diagnosis: Other abnormalities of gait and mobility (R26.89);Pain Pain - part of body:  (neck)    Time: [id number] PT Time Calculation (min) (ACUTE ONLY): 28 min   Charges:   PT Evaluation $PT Eval Low Complexity: 1 Low          Zenaida Niece, PT, DPT Acute Rehabilitation Pager: [phone]   Zenaida Niece 01/14/2021, 9:40 AM

## 2021-01-14 NOTE — Discharge Summary (Signed)
Physician Discharge Summary  Charlotte Castro FXT:[id number] DOB: March 30, 1933 DOA: 01/13/2021  PCP: Isaac Bliss, Rayford Halsted, MD  Admit date: 01/13/2021 Discharge date: 01/15/2021  Admitted From: home Disposition:  home health  Recommendations for Outpatient Follow-up:  Follow up with PCP in 1-2 weeks Please obtain BMP/CBC in one week Please follow up on the following pending results:  Home Health:yes  Equipment/Devices: no  Discharge Condition: Stable Code Status:   Code Status: Prior Diet recommendation:  Diet Order             Diet - low sodium heart healthy                    Brief/Interim Summary: 87 y.o. female with history h/o hypertension, CHF, GERD, hiatal hernia, atrial fibrillation on chronic anticoagulation with therapeutic INR, hypothyroidism, hyperlipidemia presented with myriad of symptoms-she mainly complains of chest "discomfort" and points to lower sternal area which radiates to her neck and gives a feeling of numbness in her tongue and back of her throat.  She describes occasional burning sensation as well.  She states these episodes are sometimes associated with palpitations and cold sweats.  She is also concerned about dizzy spells that she experiences on changing positions.  She is constantly worried about bilateral knee arthritis and her legs giving away/fall risk.  She reports tingling and numbness in bilateral upper extremities and relates that to her shoulder arthritis.  Takes vitamin D supplements.  Is compliant with Coumadin and INR therapeutic in this admission. ED work-up essentially unremarkable other than somewhat elevated blood pressure.  Requested to admit patient for chest pain and presyncope work-up Patient was evaluated for chest pain felt to be atypical seen by cardiology no further ischemia work-up advised.  Patient is to continue on her cardiac meds She is complaining of headache and neck pain and dizziness.  Also orthostatic advised  stockings avoid predisposing medication.She did work with PT orthostatic vitals are negative Patient complains of bilateral arm numbness tingling more on the right also pain in the right neck also has shoulder issues. Mri c spine ordered-"  Multilevel disc degeneration and spurring in the cervical spine. Spinal and foraminal stenosis as above. Negative for acute fracture or mass".  She is advised to follow-up outpatient  Discharge Diagnoses:   Chest pain: Atypical no further plan as per cardiology.  Plan for event monitor as outpatient.  She is on beta-blocker and Coumadin.  Dizzy spell/presyncope: Orthostatic on admission.  Currently orthostatic vitals are normal.  Blood pressure stable.  Minimize predisposing medication to minimize dehydration.  Discussed about TED hose.  PT OT evaluation and plan for home health. Permanent atrial fibrillation: Rate controlled continue home metoprolol and Coumadin pharmacy dosing, INR therapeutic Recent Labs  Lab 01/13/21 1221 01/14/21 0134  INR 2.5* 2.4*    Neck pain/Bilateral upper extremity tingling numbness: Suspecting DJD- MRI C-spine no acute finding needing inpatient intervention advised to follow-up outpatient with PCP/neurology/spine . Low dose neurontin was added  Hiatal hernia/GERD likely contributing to chest discomfort also.  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 neck and shoulder discomfort some. Was better by evening.  Discharge Exam: Vitals:   01/14/21 1151 01/14/21 1636  BP: 122/71 (!) 158/85  Pulse: 73 86  Resp: 19 19  Temp: 98 F (36.7 C)   SpO2: 100% 100%   General: Pt is alert, awake, not in acute  distress Cardiovascular: RRR, S1/S2 +, no rubs, no gallops Respiratory: CTA bilaterally, no wheezing, no rhonchi Abdominal: Soft, NT, ND, bowel sounds + Extremities: no edema, no cyanosis  Discharge Instructions  Discharge Instructions     Diet - low sodium heart healthy   Complete by: As directed    Discharge instructions   Complete by: As directed    Please call call MD or return to ER for similar or worsening recurring problem that brought you to hospital or if any fever,nausea/vomiting,abdominal pain, uncontrolled pain, chest pain,  shortness of breath or any other alarming symptoms.  Please follow-up your doctor as instructed in a week time and call the office for appointment.  Please avoid alcohol, smoking, or any other illicit substance and maintain healthy habits including taking your regular medications as prescribed.  You were cared for by a hospitalist during your hospital stay. If you have any questions about your discharge medications or the care you received while you were in the hospital after you are discharged, you can call the unit and ask to speak with the hospitalist on call if the hospitalist that took care of you is not available.  Once you are discharged, your primary care physician will handle any further medical issues. Please note that NO REFILLS for any discharge medications will be authorized once you are discharged, as it is imperative that you return to your primary care physician (or establish a relationship with a primary care physician if you do not have one) for your aftercare needs so that they can reassess your need for medications and monitor your lab values   Increase activity slowly   Complete by: As directed       Allergies as of 01/14/2021       Reactions   Iodinated Diagnostic Agents Shortness Of Breath, Other (See Comments)   "Allergic," per [facility]- Causes headaches, also   Zosyn [piperacillin Sod-tazobactam So] Other (See Comments)    "Allergic," per [facility]   Penicillins Other (See Comments)   Tolerated Zosyn Oct 2018, but "Allergic," per [facility] Did it involve swelling of the face/tongue/throat, SOB, or low BP? Unk Did it involve sudden or severe rash/hives, skin peeling, or any reaction on the inside of your mouth or nose? Unk Did you need to seek medical attention at a hospital or doctor's office? Unk When did it last happen? Unk If all above answers are "NO", may proceed with cephalosporin use.        Medication List     TAKE these medications    acetaminophen 650 MG CR tablet Commonly known as: TYLENOL Take 650 mg by mouth 2 (two) times daily. What changed: Another medication with the same name was removed. Continue taking this medication, and follow the directions you see here.   gabapentin 100 MG capsule Commonly known as: NEURONTIN Take 1 capsule (100 mg total) by mouth 2 (two) times daily.   pantoprazole 40 MG tablet Commonly known as: PROTONIX Take 1 tablet (40 mg total) by mouth daily for 14 days.   vitamin B-12 1000 MCG tablet Commonly known as: CYANOCOBALAMIN Take 5,000 mcg by mouth See admin instructions. Take 5,000 mcg by mouth one to two times a day   Vitamin D (Ergocalciferol) 1.25 MG (50000 UNIT) Caps capsule Commonly known as: DRISDOL Take  1 capsule (50,000 Units total) by mouth every Wednesday for 4 days. Start taking on: January 21, 2021       ASK your doctor about these medications    levothyroxine 88 MCG tablet Commonly known as: SYNTHROID TAKE ONE TABLET DAILY BEFORE BREAKFAST   metoprolol tartrate 50 MG tablet Commonly known as: LOPRESSOR TAKE ONE TABLET TWICE DAILY WITH A MEAL   warfarin 5 MG tablet Commonly known as: COUMADIN Take as directed. If you are unsure how to take this medication, talk to your nurse or doctor. Original instructions: TAKE AS DIRECTED BY COUMADIN CLINIC        Follow-up Information     Isaac Bliss, Rayford Halsted, MD Follow up in 1 week(s).    Specialty: Internal Medicine Contact information: [place] [address] [phone]         Jettie Booze, MD .   Specialties: Cardiology, Radiology, Interventional Cardiology Contact information: [address] [place] [address] [phone]                Allergies  Allergen Reactions   Iodinated Diagnostic Agents Shortness Of Breath and Other (See Comments)    "Allergic," per [facility]- Causes headaches, also   Zosyn [Piperacillin Sod-Tazobactam So] Other (See Comments)    "Allergic," per [facility]   Penicillins Other (See Comments)    Tolerated Zosyn Oct 2018, but "Allergic," per [facility] Did it involve swelling of the face/tongue/throat, SOB, or low BP? Unk Did it involve sudden or severe rash/hives, skin peeling, or any reaction on the inside of your mouth or nose? Unk Did you need to seek medical attention at a hospital or doctor's office? Unk When did it last happen? Unk If all above answers are "NO", may proceed with cephalosporin use.     The results of significant diagnostics from this hospitalization (including imaging, microbiology, ancillary and laboratory) are listed below for reference.    Microbiology: Recent Results (from the past 240 hour(s))  Resp Panel by RT-PCR (Flu A&B, Covid) Nasopharyngeal Swab     Status: None   Collection Time: 01/13/21  6:17 PM   Specimen: Nasopharyngeal Swab; Nasopharyngeal(NP) swabs in vial transport medium  Result Value Ref Range Status   SARS Coronavirus 2 by RT PCR NEGATIVE NEGATIVE Final    Comment: (NOTE) SARS-CoV-2 target nucleic acids are NOT DETECTED.  The SARS-CoV-2 RNA is generally detectable in upper respiratory specimens during the acute phase of infection. The lowest concentration of SARS-CoV-2 viral copies this assay can detect is 138 copies/mL. A negative result does not preclude SARS-Cov-2 infection and should not be used as the sole basis for treatment or other  patient management decisions. A negative result may occur with  improper specimen collection/handling, submission of specimen other than nasopharyngeal swab, presence of viral mutation(s) within the areas targeted by this assay, and inadequate number of viral copies(<138 copies/mL). A negative result must be combined with clinical observations, patient history, and epidemiological information. The expected result is Negative.  Fact Sheet for Patients:  EntrepreneurPulse.com.au  Fact Sheet for Healthcare Providers:  IncredibleEmployment.be  This test is no t yet approved or cleared by the [place] FDA and  has been authorized for detection and/or diagnosis of SARS-CoV-2 by FDA under an Emergency Use Authorization (EUA). This EUA will remain  in effect (meaning this test can be used) for the duration of the COVID-19 declaration under Section 564(b)(1) of the Act, 21 U.S.C.section 360bbb-3(b)(1), unless the authorization is terminated  or revoked sooner.       Influenza A by PCR NEGATIVE NEGATIVE Final   Influenza B by PCR NEGATIVE NEGATIVE Final    Comment: (NOTE) The Xpert Xpress SARS-CoV-2/FLU/RSV plus assay is intended as an aid in the diagnosis of influenza from Nasopharyngeal swab specimens and should not be used as a sole basis for treatment. Nasal washings and aspirates are unacceptable for Xpert Xpress SARS-CoV-2/FLU/RSV testing.  Fact Sheet for Patients: EntrepreneurPulse.com.au  Fact Sheet for Healthcare Providers: IncredibleEmployment.be  This test is not yet approved or cleared by the [place] FDA and has been authorized for detection and/or diagnosis of SARS-CoV-2 by FDA under an Emergency Use Authorization (EUA). This EUA will remain in effect (meaning this test can be used) for the duration of the COVID-19 declaration under Section 564(b)(1) of the Act, 21 U.S.C. section  360bbb-3(b)(1), unless the authorization is terminated or revoked.  Performed at [facility], [place] [address] [place], [place] [address]     Procedures/Studies: DG Chest 2 View  Result Date: 01/13/2021 CLINICAL DATA:  Chest pain EXAM: CHEST - 2 VIEW COMPARISON:  01/19/19 FINDINGS: Stable cardiomediastinal contours. Large hiatal hernia is again noted. No pleural effusion or edema. No airspace densities identified. Unchanged, chronic upper lung zone predominant interstitial coarsening. The visualized osseous structures are unremarkable. IMPRESSION: 1. No acute cardiopulmonary abnormalities. 2. Large hiatal hernia. Electronically Signed   By: Kerby Moors M.D.   On: 01/13/2021 12:31   MR CERVICAL SPINE WO CONTRAST  Result Date: 01/14/2021 CLINICAL DATA:  Chronic neck pain EXAM: MRI CERVICAL SPINE WITHOUT CONTRAST TECHNIQUE: Multiplanar, multisequence MR imaging of the cervical spine was performed. No intravenous contrast was administered. COMPARISON:  None. FINDINGS: Alignment: Slight anterolisthesis C3-4. Mild retrolisthesis C4-5, C5-6 Vertebrae: Normal bone marrow. Negative for acute fracture or mass. Probable mild chronic fracture superior endplate of T2. Cord: Normal signal and morphology Posterior Fossa, vertebral arteries, paraspinal tissues: Negative Disc levels: C2-3: Mild degenerative change.  Negative for stenosis C3-4: Mild disc and facet degeneration. Mild foraminal narrowing bilaterally C4-5: Disc degeneration with diffuse uncinate spurring. Bilateral facet degeneration. Moderate foraminal encroachment bilaterally C5-6: Disc degeneration with diffuse uncinate spurring. Moderate foraminal stenosis bilaterally C6-7: Disc degeneration and mild uncinate spurring. Mild foraminal narrowing bilaterally C7-T1: Negative IMPRESSION: Multilevel disc degeneration and spurring in the cervical spine. Spinal and foraminal stenosis as above. Negative for acute fracture or mass. Electronically Signed    By: Franchot Gallo M.D.   On: 01/14/2021 16:40    Labs: BNP (last 3 results) Recent Labs    01/13/21 1221  BNP 742.5*   Basic Metabolic Panel: Recent Labs  Lab 01/13/21 1221  NA 139  K 4.0  CL 109  CO2 23  GLUCOSE 99  BUN 17  CREATININE 1.02*  CALCIUM 9.8   Liver Function Tests: Recent Labs  Lab 01/13/21 1221  AST 23  ALT 16  ALKPHOS 65  BILITOT 1.6*  PROT 6.7  ALBUMIN 4.0   No results for input(s): LIPASE, AMYLASE in the last 168 hours. No results for input(s): AMMONIA in the last 168 hours. CBC: Recent Labs  Lab 01/13/21 1221  WBC 8.3  NEUTROABS 4.9  HGB 14.8  HCT 42.2  MCV 94.0  PLT 186   Cardiac Enzymes: No results for input(s): CKTOTAL, CKMB, CKMBINDEX, TROPONINI in the last 168 hours. BNP: Invalid input(s): POCBNP CBG: No results for input(s): GLUCAP in the last 168 hours. D-Dimer No results for input(s): DDIMER in the last 72 hours.  Hgb A1c No results for input(s): HGBA1C in the last 72 hours. Lipid Profile No results for input(s): CHOL, HDL, LDLCALC, TRIG, CHOLHDL, LDLDIRECT in the last 72 hours. Thyroid function studies Recent Labs    01/13/21 1220  TSH 1.305   Anemia work up Recent Labs    01/13/21 2007  VITAMINB12 4,712*   Urinalysis    Component Value Date/Time   COLORURINE YELLOW 01/13/2021 1325   APPEARANCEUR CLEAR 01/13/2021 1325   LABSPEC 1.009 01/13/2021 1325   PHURINE 7.0 01/13/2021 1325   GLUCOSEU NEGATIVE 01/13/2021 1325   GLUCOSEU NEGATIVE 08/07/2020 1200   HGBUR NEGATIVE 01/13/2021 1325   BILIRUBINUR NEGATIVE 01/13/2021 1325   BILIRUBINUR neg 08/07/2020 1115   KETONESUR NEGATIVE 01/13/2021 1325   PROTEINUR NEGATIVE 01/13/2021 1325   UROBILINOGEN 0.2 08/07/2020 1200   UROBILINOGEN 0.2 08/07/2020 1115   NITRITE NEGATIVE 01/13/2021 1325   LEUKOCYTESUR NEGATIVE 01/13/2021 1325   Sepsis Labs Invalid input(s): PROCALCITONIN,  WBC,  LACTICIDVEN Microbiology Recent Results (from the past 240 hour(s))  Resp  Panel by RT-PCR (Flu A&B, Covid) Nasopharyngeal Swab     Status: None   Collection Time: 01/13/21  6:17 PM   Specimen: Nasopharyngeal Swab; Nasopharyngeal(NP) swabs in vial transport medium  Result Value Ref Range Status   SARS Coronavirus 2 by RT PCR NEGATIVE NEGATIVE Final    Comment: (NOTE) SARS-CoV-2 target nucleic acids are NOT DETECTED.  The SARS-CoV-2 RNA is generally detectable in upper respiratory specimens during the acute phase of infection. The lowest concentration of SARS-CoV-2 viral copies this assay can detect is 138 copies/mL. A negative result does not preclude SARS-Cov-2 infection and should not be used as the sole basis for treatment or other patient management decisions. A negative result may occur with  improper specimen collection/handling, submission of specimen other than nasopharyngeal swab, presence of viral mutation(s) within the areas targeted by this assay, and inadequate number of viral copies(<138 copies/mL). A negative result must be combined with clinical observations, patient history, and epidemiological information. The expected result is Negative.  Fact Sheet for Patients:  EntrepreneurPulse.com.au  Fact Sheet for Healthcare Providers:  IncredibleEmployment.be  This test is no t yet approved or cleared by the [place] FDA and  has been authorized for detection and/or diagnosis of SARS-CoV-2 by FDA under an Emergency Use Authorization (EUA). This EUA will remain  in effect (meaning this test can be used) for the duration of the COVID-19 declaration under Section 564(b)(1) of the Act, 21 U.S.C.section 360bbb-3(b)(1), unless the authorization is terminated  or revoked sooner.       Influenza A by PCR NEGATIVE NEGATIVE Final   Influenza B by PCR NEGATIVE NEGATIVE Final    Comment: (NOTE) The Xpert Xpress SARS-CoV-2/FLU/RSV plus assay is intended as an aid in the diagnosis of influenza from Nasopharyngeal  swab specimens and should not be used as a sole basis for treatment. Nasal washings and aspirates are unacceptable for Xpert Xpress SARS-CoV-2/FLU/RSV testing.  Fact Sheet for Patients: EntrepreneurPulse.com.au  Fact Sheet for Healthcare Providers: IncredibleEmployment.be  This test is not yet approved or cleared by the [place] FDA and has been authorized for detection and/or diagnosis of SARS-CoV-2 by FDA under an Emergency Use Authorization (EUA). This EUA will remain in effect (meaning this test can be used) for the duration of the COVID-19 declaration under Section 564(b)(1) of the Act, 21 U.S.C. section 360bbb-3(b)(1), unless the authorization is terminated or revoked.  Performed at [facility], [place] [address] [place], [place]  [address]      Time coordinating discharge: 25 minutes  SIGNED: Antonieta Pert, MD  [facility] 01/15/2021, 2:36 PM  If 7PM-7AM, please contact night-coverage [email or web address]

## 2021-01-15 ENCOUNTER — Emergency Department (HOSPITAL_COMMUNITY): Payer: Medicare Other

## 2021-01-15 ENCOUNTER — Ambulatory Visit (INDEPENDENT_AMBULATORY_CARE_PROVIDER_SITE_OTHER): Payer: Medicare Other

## 2021-01-15 ENCOUNTER — Encounter (HOSPITAL_COMMUNITY): Payer: Self-pay

## 2021-01-15 ENCOUNTER — Emergency Department (HOSPITAL_COMMUNITY)
Admission: EM | Admit: 2021-01-15 | Discharge: 2021-01-16 | Disposition: A | Discharge status: Home / Self Care | Payer: Medicare Other | Attending: Emergency Medicine | Admitting: Emergency Medicine

## 2021-01-15 ENCOUNTER — Other Ambulatory Visit: Payer: Self-pay | Admitting: Home Health

## 2021-01-15 ENCOUNTER — Other Ambulatory Visit: Payer: Self-pay

## 2021-01-15 DIAGNOSIS — R519 Headache, unspecified: Secondary | ICD-10-CM | POA: Insufficient documentation

## 2021-01-15 DIAGNOSIS — Z79899 Other long term (current) drug therapy: Secondary | ICD-10-CM | POA: Insufficient documentation

## 2021-01-15 DIAGNOSIS — G459 Transient cerebral ischemic attack, unspecified: Secondary | ICD-10-CM | POA: Diagnosis not present

## 2021-01-15 DIAGNOSIS — Z8679 Personal history of other diseases of the circulatory system: Secondary | ICD-10-CM | POA: Insufficient documentation

## 2021-01-15 DIAGNOSIS — Z8542 Personal history of malignant neoplasm of other parts of uterus: Secondary | ICD-10-CM | POA: Diagnosis not present

## 2021-01-15 DIAGNOSIS — I5032 Chronic diastolic (congestive) heart failure: Secondary | ICD-10-CM | POA: Insufficient documentation

## 2021-01-15 DIAGNOSIS — I110 Hypertensive heart disease with heart failure: Secondary | ICD-10-CM | POA: Diagnosis not present

## 2021-01-15 DIAGNOSIS — I4821 Permanent atrial fibrillation: Secondary | ICD-10-CM

## 2021-01-15 DIAGNOSIS — R531 Weakness: Secondary | ICD-10-CM | POA: Insufficient documentation

## 2021-01-15 DIAGNOSIS — R202 Paresthesia of skin: Secondary | ICD-10-CM | POA: Insufficient documentation

## 2021-01-15 DIAGNOSIS — R42 Dizziness and giddiness: Secondary | ICD-10-CM

## 2021-01-15 DIAGNOSIS — E039 Hypothyroidism, unspecified: Secondary | ICD-10-CM | POA: Insufficient documentation

## 2021-01-15 DIAGNOSIS — Z7901 Long term (current) use of anticoagulants: Secondary | ICD-10-CM | POA: Insufficient documentation

## 2021-01-15 LAB — CBC WITH DIFFERENTIAL/PLATELET
Abs Immature Granulocytes: 0.04 10*3/uL (ref 0.00–0.07)
Basophils Absolute: 0.1 10*3/uL (ref 0.0–0.1)
Basophils Relative: 1 %
Eosinophils Absolute: 0.2 10*3/uL (ref 0.0–0.5)
Eosinophils Relative: 3 %
HCT: 39.3 % (ref 36.0–46.0)
Hemoglobin: 13.9 g/dL (ref 12.0–15.0)
Immature Granulocytes: 1 %
Lymphocytes Relative: 22 %
Lymphs Abs: 1.7 10*3/uL (ref 0.7–4.0)
MCH: 33 pg (ref 26.0–34.0)
MCHC: 35.4 g/dL (ref 30.0–36.0)
MCV: 93.3 fL (ref 80.0–100.0)
Monocytes Absolute: 0.8 10*3/uL (ref 0.1–1.0)
Monocytes Relative: 11 %
Neutro Abs: 4.9 10*3/uL (ref 1.7–7.7)
Neutrophils Relative %: 62 %
Platelets: 199 10*3/uL (ref 150–400)
RBC: 4.21 MIL/uL (ref 3.87–5.11)
RDW: 13.3 % (ref 11.5–15.5)
WBC: 7.7 10*3/uL (ref 4.0–10.5)
nRBC: 0 % (ref 0.0–0.2)

## 2021-01-15 LAB — BASIC METABOLIC PANEL
Anion gap: 11 (ref 5–15)
BUN: 20 mg/dL (ref 8–23)
CO2: 20 mmol/L — ABNORMAL LOW (ref 22–32)
Calcium: 9 mg/dL (ref 8.9–10.3)
Chloride: 107 mmol/L (ref 98–111)
Creatinine, Ser: 1.33 mg/dL — ABNORMAL HIGH (ref 0.44–1.00)
GFR, Estimated: 39 mL/min — ABNORMAL LOW (ref >60)
Glucose, Bld: 106 mg/dL — ABNORMAL HIGH (ref 70–99)
Potassium: 4.2 mmol/L (ref 3.5–5.1)
Sodium: 138 mmol/L (ref 135–145)

## 2021-01-15 LAB — MAGNESIUM: Magnesium: 1.7 mg/dL (ref 1.7–2.4)

## 2021-01-15 IMAGING — CT CT HEAD W/O CM
3 series · 15 of 47 positions shown, 18 images · non-contrast
Comparison: [DATE]

CLINICAL DATA: TIA, right-sided numbness, tremors

EXAM:
CT HEAD WITHOUT CONTRAST
TECHNIQUE: Contiguous axial images were obtained from the base of the skull
through the vertex without intravenous contrast.

[Series 3: head 5.0 h30s · axial · 0.45mm/px · z∈[+1515,+1650]mm · 9 of 33 slices shown, 12 images]
[im 3/33  brain]
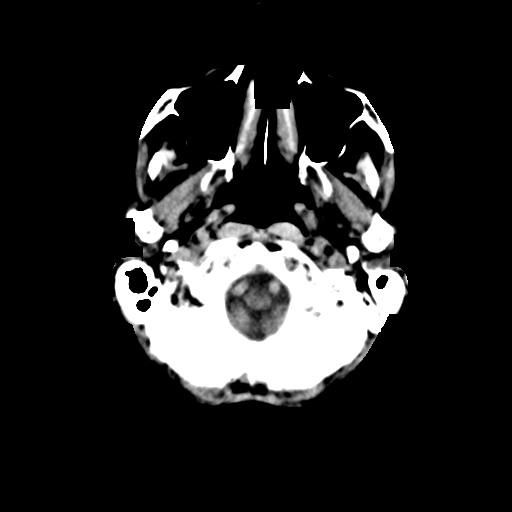
[im 3/33  bone]
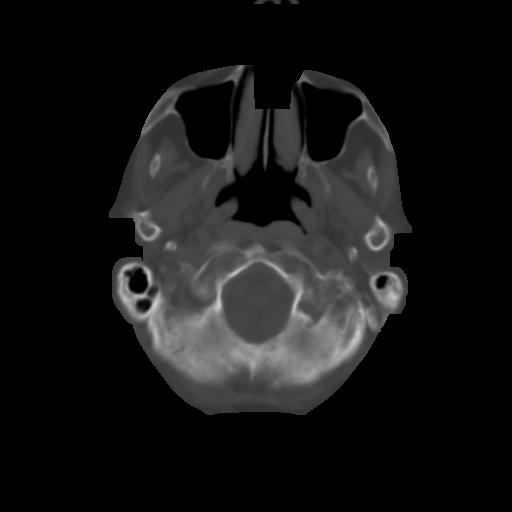
[im 6/33  brain]
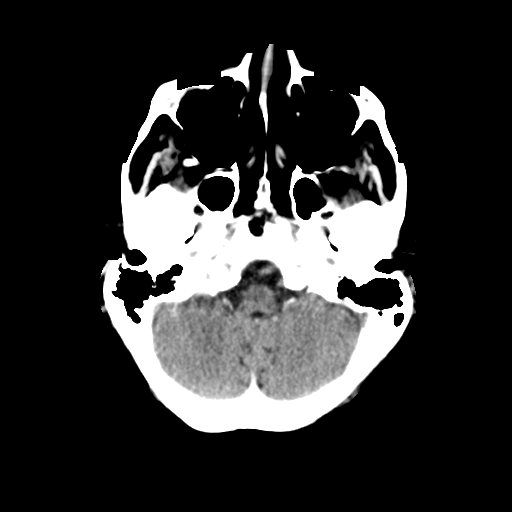
[im 9/33  brain]
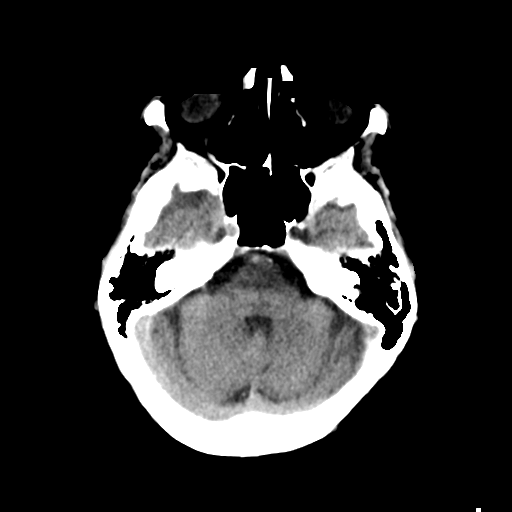
[im 13/33  brain]
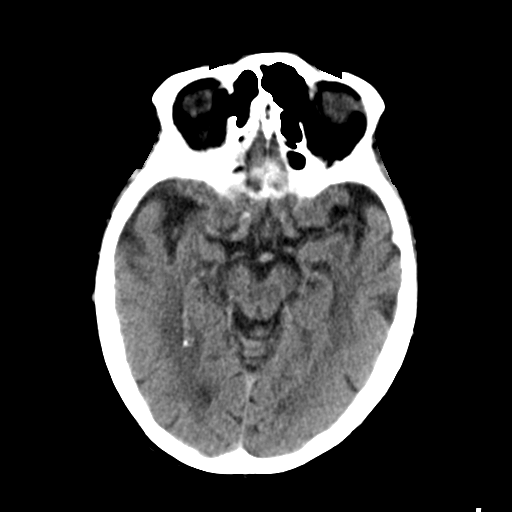
[im 17/33  brain]
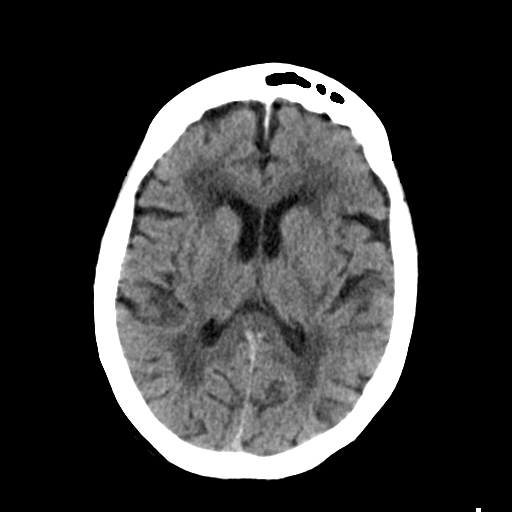
[im 17/33  bone]
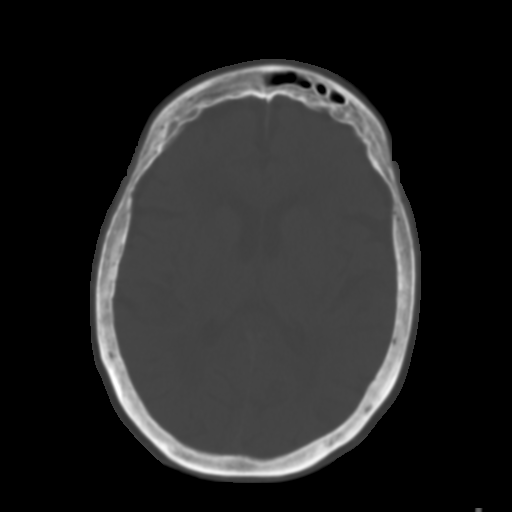
[im 20/33  brain]
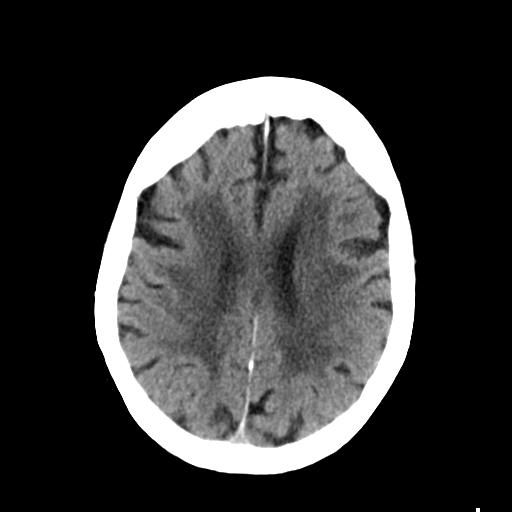
[im 24/33  brain]
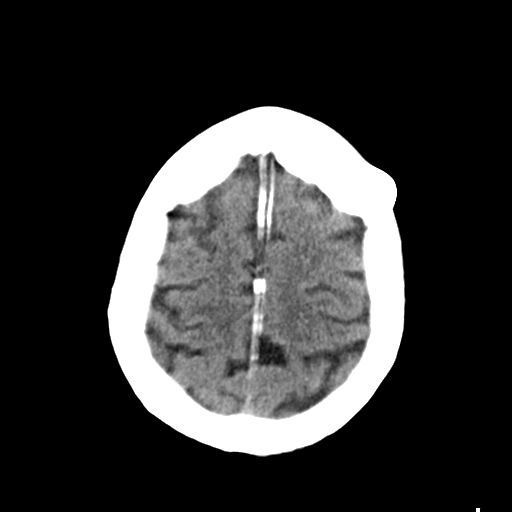
[im 27/33  brain]
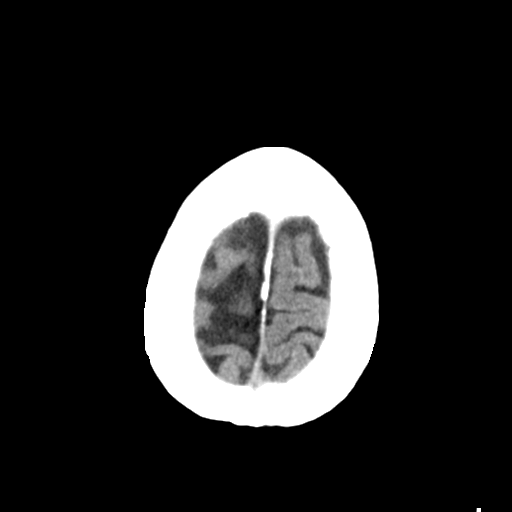
[im 30/33  brain]
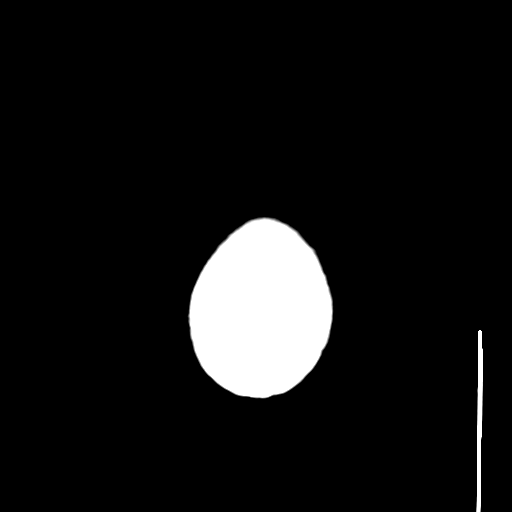
[im 30/33  bone]
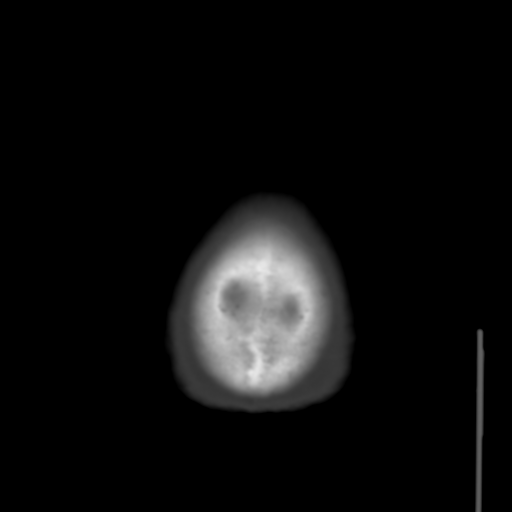

[Series 5: head 3.0 mpr cor · coronal · 0.31mm/px · 3 of 68 slices shown]
[im 23/68  brain]
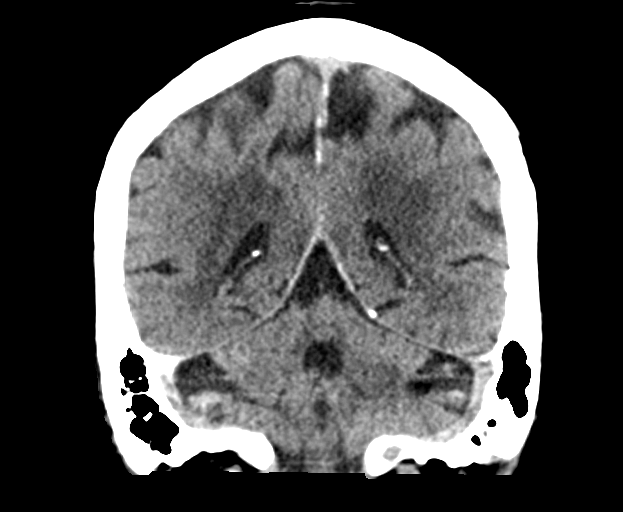
[im 30/68  brain]
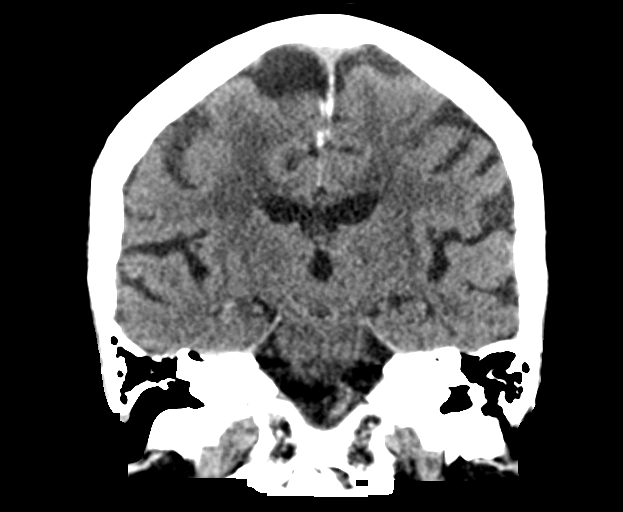
[im 38/68  brain]
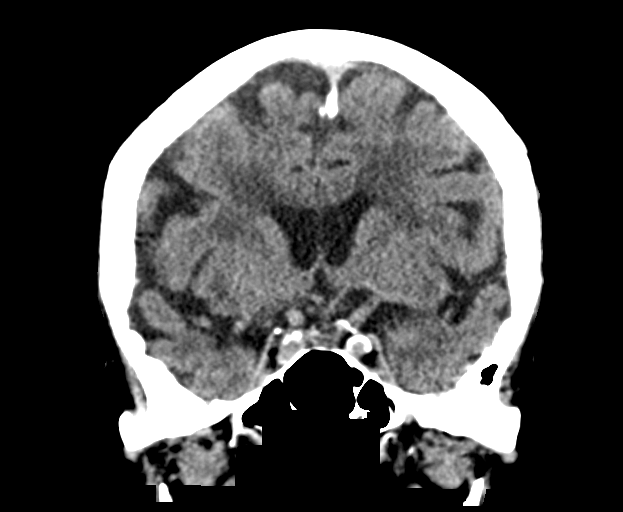

[Series 6: head 3.0 mpr sag · sagittal · 0.31mm/px · 3 of 57 slices shown]
[im 19/57  brain]
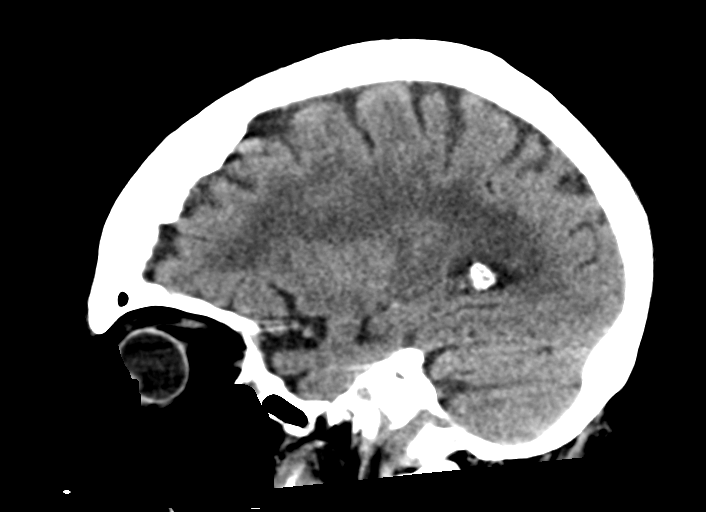
[im 29/57  brain]
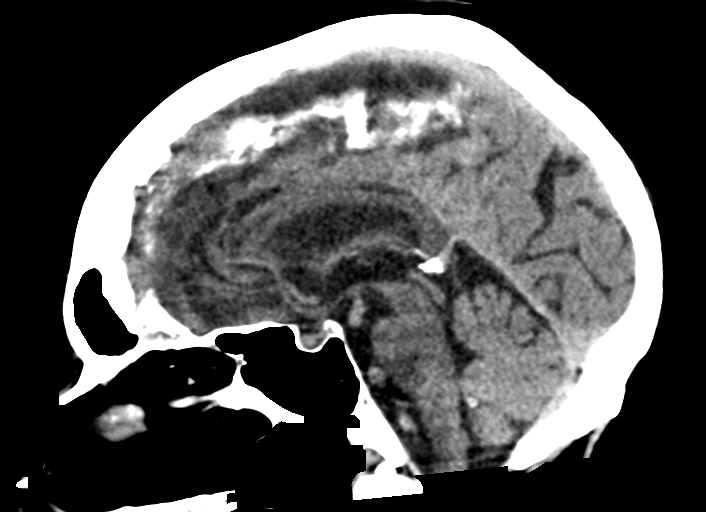
[im 38/57  brain]
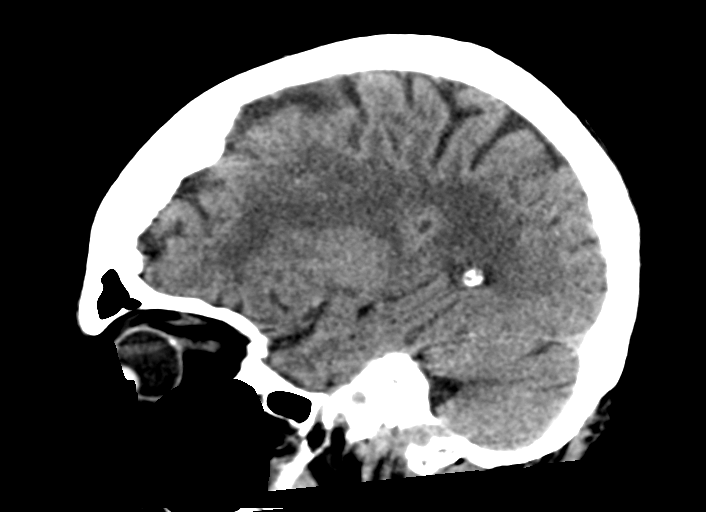

[15 of 47 positions shown; findings below may reference images not displayed]

FINDINGS: Brain: Confluent hypodensities throughout the periventricular white
matter consistent with chronic small vessel ischemic changes,
stable. No signs of acute infarct or hemorrhage. The lateral
ventricles and midline structures are unremarkable. There are no
acute extra-axial fluid collections. No mass effect.

Vascular: No hyperdense vessel or unexpected calcification.

Skull: No acute bony abnormality. Stable exostosis left frontal
bone.

Sinuses/Orbits: No acute finding.

Other: None.
IMPRESSION: 1. No acute intracranial process. Stable chronic small vessel
ischemic changes.

## 2021-01-15 MED ORDER — LORAZEPAM 1 MG PO TABS
1.0000 mg | ORAL_TABLET | Freq: Once | ORAL | Status: AC
  Administered 2021-01-15: 1 mg via ORAL
  Filled 2021-01-15: qty 1

## 2021-01-15 MED ORDER — HYDROXYZINE HCL 10 MG PO TABS
10.0000 mg | ORAL_TABLET | Freq: Once | ORAL | Status: AC
  Administered 2021-01-15: 10 mg via ORAL
  Filled 2021-01-15: qty 1

## 2021-01-15 NOTE — ED Notes (Signed)
I updated pt's daughter Jones Broom, ok'd by patient.

## 2021-01-15 NOTE — ED Notes (Signed)
ambulated pt in room. Pt steady on feet.

## 2021-01-15 NOTE — ED Provider Notes (Signed)
[facility] EMERGENCY DEPARTMENT Provider Note   CSN: [id number] Arrival date & time: 01/15/21  1504     History Chief Complaint  Patient presents with   Blurred Vision   Numbness    Charlotte Castro is a 87 y.o. female.  HPI     87 year old female comes in with chief complaint of numbness, vision change, weakness.  She has history of A. fib, CHF, gastritis, hypertension.  She reports that over the last 3 months she has had generalized paresthesia and weakness.  She is also having some headaches over the both sides.  Weakness is generalized and not focal.   Patient has mentioned her complaint to her PCP, but they were not able to explain her symptoms.  She was recently admitted to the hospital for cardiac work-up and had an MRI of her spine which was negative.  Her pain in the head, neck is worse with movement.  She does not think her tingling gets worse with neck movement.  The paresthesias are waxing and waning in intensity but mostly there.  She also has some generalized weakness that almost prevents her from getting up sometimes.  Review of system is negative for any nausea, vomiting, fevers, chills.   Past Medical History:  Diagnosis Date   Anemia    history of   Anxiety    Arthritis    "left knee" (04/05/2017)   Atrial fibrillation (HCC)    Atrial flutter ([place])    typical appearing   Atrial tachycardia ([place])    ablated 11/17/10  by JA  from the [facility] of the aorta   CHF (congestive heart failure) (HCC)    Chronic lower back pain    Chronic nausea    Endometrial cancer (HCC)    grade 1   Gallstone pancreatitis    Gastritis    GERD (gastroesophageal reflux disease)    History of hiatal hernia    HTN (hypertension)    Hyperlipemia    Hypothyroidism    Obesity    Osteoporosis    Toe ulcer ([place])    left 3rd toe   Vitamin D deficiency     Patient Active Problem List   Diagnosis Date Noted   Chest pain 01/13/2021   Orthostasis 01/13/2021    Abdominal pain    Acute cystitis without hematuria    Hiatal hernia 01/29/2019   AKI (acute kidney injury) ([place]) 01/14/2019   B12 deficiency 08/08/2018   Hypothyroidism 08/08/2018   Endometrial cancer ([place]) 08/05/2017   Toe ulcer, left, with unspecified severity ([place]) 08/04/2017   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 ([place]) 08/04/2017   Postmenopausal bleeding 08/01/2017   Epigastric abdominal pain 04/05/2017   Delirium 04/14/2016   Tremor 04/14/2016   Chronic diastolic CHF (congestive heart failure) ([place]) 04/10/2016   Non-intractable vomiting 10/09/2015   Essential hypertension, benign 01/18/2014   Long term current use of anticoagulant therapy 01/18/2014   Permanent atrial fibrillation ([place]) 06/10/2011    Past Surgical History:  Procedure Laterality Date   ATRIAL ABLATION SURGERY  11/17/10   Atrial tachycardia arising from [facility] of the aorta ablated by Corrigan  01/11/2011   Archie Endo 01/12/2011   CHOLECYSTECTOMY N/A 04/08/2017   Procedure: LAPAROSCOPIC CHOLECYSTECTOMY;  Surgeon: Georganna Skeans, MD;  Location: [place];  Service: General;  Laterality: N/A;   FRACTURE SURGERY     HYSTEROSCOPY WITH D & C N/A 08/05/2017  Procedure: DILATATION AND CURETTAGE /HYSTEROSCOPY AND POLYPECTOMY;  Surgeon: Osborne Oman, MD;  Location: [facility];  Service: Gynecology;  Laterality: N/A;   LUMBAR SPINE SURGERY  ~ Henry SALPINGO OOPHERECTOMY Bilateral 09/20/2017   Procedure: XI ROBOTIC ASSISTED TOTAL HYSTERECTOMY WITH BILATERAL SALPINGO OOPHORECTOMY, SENTINAL LYMPH NODE BIOPSY;  Surgeon: Everitt Amber, MD;  Location: [facility];  Service: Gynecology;  Laterality: Bilateral;   SHOULDER SURGERY Right 1984 X2   /ntoes 01/19/2011   WRIST FRACTURE SURGERY Left 1983     OB History     Gravida  3   Para      Term      Preterm      AB  1   Living         SAB  1   IAB      Ectopic       Multiple      Live Births              Family History  Problem Relation Age of Onset   Heart attack Father    Hypertension Father     Social History   Tobacco Use   Smoking status: Never   Smokeless tobacco: Never  Vaping Use   Vaping Use: Never used  Substance Use Topics   Alcohol use: No   Drug use: No    Home Medications Prior to Admission medications   Medication Sig Start Date End Date Taking? Authorizing Provider  acetaminophen (TYLENOL) 650 MG CR tablet Take 650 mg by mouth 2 (two) times daily.    [provider]  gabapentin (NEURONTIN) 100 MG capsule Take 1 capsule (100 mg total) by mouth 2 (two) times daily. 01/14/21 02/13/21  Antonieta Pert, MD  levothyroxine (SYNTHROID) 88 MCG tablet TAKE ONE TABLET DAILY BEFORE BREAKFAST Patient taking differently: Take 88 mcg by mouth daily before breakfast. 09/19/20   Isaac Bliss, Rayford Halsted, MD  metoprolol tartrate (LOPRESSOR) 50 MG tablet TAKE ONE TABLET TWICE DAILY WITH A MEAL Patient taking differently: Take 50 mg by mouth 2 (two) times daily with a meal. 12/18/20   Jettie Booze, MD  pantoprazole (PROTONIX) 40 MG tablet Take 1 tablet (40 mg total) by mouth daily for 14 days. 01/15/21 01/29/21  Antonieta Pert, MD  vitamin B-12 (CYANOCOBALAMIN) 1000 MCG tablet Take 5,000 mcg by mouth See admin instructions. Take 5,000 mcg by mouth one to two times a day    [provider]  Vitamin D, Ergocalciferol, (DRISDOL) 1.25 MG (50000 UNIT) CAPS capsule Take 1 capsule (50,000 Units total) by mouth every Wednesday for 4 days. 01/21/21 01/25/21  Antonieta Pert, MD  warfarin (COUMADIN) 5 MG tablet TAKE AS DIRECTED BY COUMADIN CLINIC Patient taking differently: Take 2.5-5 mg by mouth See admin instructions. Take 5 mg by mouth in the morning on Sun/Mon/Wed/Thurs/Fri and 2.5 mg on Tues/Sat 12/18/20   Jettie Booze, MD    Allergies    Iodinated diagnostic agents, Zosyn [piperacillin sod-tazobactam so], and  Penicillins  Review of Systems   Review of Systems  Constitutional:  Positive for activity change.  Respiratory:  Negative for shortness of breath.   Cardiovascular:  Negative for chest pain.  Gastrointestinal:  Negative for nausea and vomiting.  Skin:  Negative for rash.  Allergic/Immunologic: Negative for immunocompromised state.  Neurological:  Positive for weakness, numbness and headaches.  Hematological:  Bruises/bleeds easily.  All other systems reviewed and are negative.  Physical Exam Updated Vital  Signs BP 136/79   Pulse 86   Temp (!) 97.5 F (36.4 C) (Oral)   Resp 20   SpO2 100%   Physical Exam Vitals and nursing note reviewed.  Constitutional:      Appearance: She is well-developed.  HENT:     Head: Atraumatic.  Eyes:     Extraocular Movements: Extraocular movements intact.     Pupils: Pupils are equal, round, and reactive to light.  Neck:     Vascular: No carotid bruit.  Cardiovascular:     Rate and Rhythm: Normal rate.  Pulmonary:     Effort: Pulmonary effort is normal.  Musculoskeletal:     Cervical back: Normal range of motion and neck supple. No rigidity.  Skin:    General: Skin is warm and dry.  Neurological:     Mental Status: She is alert and oriented to person, place, and time.     Cranial Nerves: No cranial nerve deficit.     Sensory: No sensory deficit.     Motor: No weakness.     Coordination: Coordination normal.    ED Results / Procedures / Treatments   Labs (all labs ordered are listed, but only abnormal results are displayed) Labs Reviewed  BASIC METABOLIC PANEL - Abnormal; Notable for the following components:      Result Value   CO2 20 (*)    Glucose, Bld 106 (*)    Creatinine, Ser 1.33 (*)    GFR, Estimated 39 (*)    All other components within normal limits  CBC WITH DIFFERENTIAL/PLATELET  MAGNESIUM  CBG MONITORING, ED    EKG EKG Interpretation  Date/Time:  Thursday January 15 2021 15:15:47 EDT Ventricular Rate:   99 PR Interval:  156 QRS Duration: 76 QT Interval:  346 QTC Calculation: 444 R Axis:   42 Text Interpretation: Normal sinus rhythm with sinus arrhythmia Nonspecific ST and T wave abnormality Abnormal ECG No acute changes No significant change since last tracing Confirmed by Varney Biles [phone]) on 01/15/2021 9:34:29 PM  Radiology CT HEAD WO CONTRAST (5MM)  Result Date: 01/15/2021 CLINICAL DATA:  TIA, right-sided numbness, tremors EXAM: CT HEAD WITHOUT CONTRAST TECHNIQUE: Contiguous axial images were obtained from the base of the skull through the vertex without intravenous contrast. COMPARISON:  04/09/2016 FINDINGS: Brain: Confluent hypodensities throughout the periventricular white matter consistent with chronic small vessel ischemic changes, stable. No signs of acute infarct or hemorrhage. The lateral ventricles and midline structures are unremarkable. There are no acute extra-axial fluid collections. No mass effect. Vascular: No hyperdense vessel or unexpected calcification. Skull: No acute bony abnormality. Stable exostosis left frontal bone. Sinuses/Orbits: No acute finding. Other: None. IMPRESSION: 1. No acute intracranial process. Stable chronic small vessel ischemic changes. Electronically Signed   By: Randa Ngo M.D.   On: 01/15/2021 16:25   MR CERVICAL SPINE WO CONTRAST  Result Date: 01/14/2021 CLINICAL DATA:  Chronic neck pain EXAM: MRI CERVICAL SPINE WITHOUT CONTRAST TECHNIQUE: Multiplanar, multisequence MR imaging of the cervical spine was performed. No intravenous contrast was administered. COMPARISON:  None. FINDINGS: Alignment: Slight anterolisthesis C3-4. Mild retrolisthesis C4-5, C5-6 Vertebrae: Normal bone marrow. Negative for acute fracture or mass. Probable mild chronic fracture superior endplate of T2. Cord: Normal signal and morphology Posterior Fossa, vertebral arteries, paraspinal tissues: Negative Disc levels: C2-3: Mild degenerative change.  Negative for stenosis C3-4:  Mild disc and facet degeneration. Mild foraminal narrowing bilaterally C4-5: Disc degeneration with diffuse uncinate spurring. Bilateral facet degeneration. Moderate foraminal encroachment bilaterally C5-6: Disc  degeneration with diffuse uncinate spurring. Moderate foraminal stenosis bilaterally C6-7: Disc degeneration and mild uncinate spurring. Mild foraminal narrowing bilaterally C7-T1: Negative IMPRESSION: Multilevel disc degeneration and spurring in the cervical spine. Spinal and foraminal stenosis as above. Negative for acute fracture or mass. Electronically Signed   By: Franchot Gallo M.D.   On: 01/14/2021 16:40    Procedures Procedures   Medications Ordered in ED Medications  hydrOXYzine (ATARAX/VISTARIL) tablet 10 mg (10 mg Oral Given 01/15/21 1545)  LORazepam (ATIVAN) tablet 1 mg (1 mg Oral Given 01/15/21 2245)    ED Course  I have reviewed the triage vital signs and the nursing notes.  Pertinent labs & imaging results that were available during my care of the patient were reviewed by me and considered in my medical decision making (see chart for details).    MDM Rules/Calculators/A&P                            87 year old comes in a chief complaint of generalized weakness, paresthesias.  Symptoms have been present now for 3 months.  She does also indicate having some headache/neck pain.  Intermittently she has had some blurry vision.  It does not appear that this is temporal arteritis.  Pain has been present for far too long and there is no vision loss.  Pain is worse with movement of the head, carotid/vertebral dissection, aneurysm possible.  She had MRI of the cervical spine recently which was negative.  Transverse myelitis of the neck considered, but given the negative MRI recently is less likely.  Also with the symptoms going on for 3 months, ascending demyelinating process is also low.  My suspicion for conditions like MS is also low.   From emergency perspective, we will get  angiograms to ensure there is no vascular emergency.  If that is negative then she should follow-up with the neurologist as recommended by the inpatient doctors during her admission.  Final Clinical Impression(s) / ED Diagnoses Final diagnoses:  Paresthesias  Generalized weakness    Rx / DC Orders ED Discharge Orders     None        Varney Biles, MD 01/16/21 [phone]

## 2021-01-15 NOTE — ED Notes (Signed)
Pt here for numbness all over body and HA that is chronic. Pt said it's no different than yesterday or the day before and has a follow up with PCP tomorrow. Pt aware she's going to get MRI's. Pt neurologically intact. Pt given a warm blanket and readjusted in bed. Denies further needs.

## 2021-01-15 NOTE — Discharge Instructions (Signed)
We saw you in the ER for weakness, tingling, headaches.  All the results in the ER are normal, labs and imaging. We are not sure what is causing your symptoms. The workup in the ER is not complete, and is limited to screening for life threatening and emergent conditions only, so please see your primary care doctor for further evaluation.

## 2021-01-15 NOTE — Progress Notes (Signed)
Event monitor ordered for dizziness per Dr Percival Spanish, follow up with primary cardiology Dr Irish Lack

## 2021-01-15 NOTE — ED Triage Notes (Signed)
Pt reports numbness all over her body and blurred vision for the past 3 months. Pt seen here yesterday for similar symptoms. MRI done. Pt very anxious in triage, hyperventilating.

## 2021-01-15 NOTE — ED Provider Notes (Signed)
Patient care assumed at 2300.  Pt here with progressive paresthesia, vision changes.  MRI pending.    MRI negative for acute abnormalities. Plan to discharge home with outpatient resources.   Quintella Reichert, MD 01/16/21 [phone]

## 2021-01-15 NOTE — ED Notes (Signed)
Pt to MRI via stretcher.

## 2021-01-15 NOTE — Patient Instructions (Signed)
Description   Called spoke with pt, pt's INR checked on 01/14/21 at ED, instructed pt to continue on same dosage of Warfarin 1 tablet daily except for 1/2 tablet on Tuesdays and Saturdays. Call [facility] for any changes in medications or upcoming procedures. Recheck INR in 5 weeks. [facility] [phone].

## 2021-01-15 NOTE — ED Provider Notes (Signed)
Emergency Medicine Provider Triage Evaluation Note  Charlotte Castro , a 87 y.o. female  was evaluated in triage.  Pt complains of full body paresthesias with intermittent visual changes, going on for last 3 months, denies headaches, weakness in the upper and/or lower extremities, denies head trauma, currently on warfarin.  Review of Systems  Positive: Full body paresthesias, intermittent visual changes Negative: Chest pain, shortness of breath  Physical Exam  BP 133/86 (BP Location: Left Arm)   Pulse 95   Temp (!) 97.5 F (36.4 C) (Oral)   Resp (!) 30   SpO2 97%  Gen:   Awake, no distress   Resp:  Normal effort  MSK:   Moves extremities without difficulty  Other:  No facial asymmetry, no difficulty following commands, no slurring of her words, no difficulty word finding, no unilateral weakness.  Medical Decision Making  Medically screening exam initiated at 3:23 PM.  Appropriate orders placed.  Charlotte Castro was informed that the remainder of the evaluation will be completed by another provider, this initial triage assessment does not replace that evaluation, and the importance of remaining in the ED until their evaluation is complete.  Presents with complaint of bilateral paresthesias, patient need further work-up.  Patient is extremely anxious on my exam, will provide her with a small dose of Vistaril.   Marcello Fennel, PA-C 01/15/21 1525    Horton, Alvin Critchley, DO 01/16/21 [phone]

## 2021-01-16 ENCOUNTER — Ambulatory Visit: Payer: Medicare Other | Admitting: Internal Medicine

## 2021-01-16 DIAGNOSIS — R202 Paresthesia of skin: Secondary | ICD-10-CM | POA: Diagnosis not present

## 2021-01-16 IMAGING — MR MR MRA NECK WO/W CM
8 series · 46 of 48 positions shown · IV contrast (Gadavist)
Comparison: None.

CLINICAL DATA: Headache with vision changes

EXAM:
MRA NECK WITHOUT AND WITH CONTRAST
TECHNIQUE: Multiplanar and multiecho pulse sequences of the neck were obtained
without and with intravenous contrast. Angiographic images of the
neck were obtained using MRA technique without and with intravenous
contrast.
CONTRAST:  9.5mL GADAVIST GADOBUTROL 1 MMOL/ML IV SOLN

[Series 11: tof_fl3d_tra_iso · axial · 0.6mm · 0.52mm/px · z∈[-169,-94]mm · 8 of 133 slices shown]
[im 1/133]
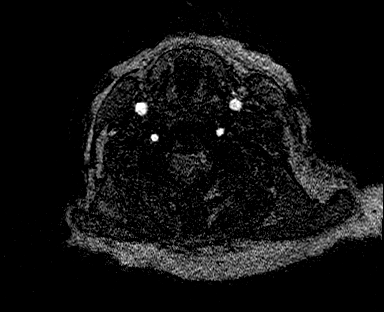
[im 19/133]
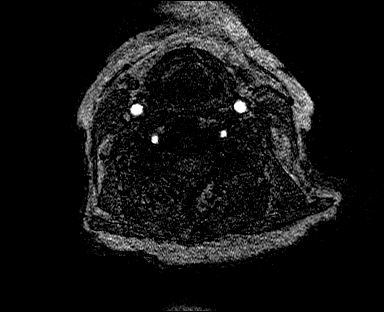
[im 38/133]
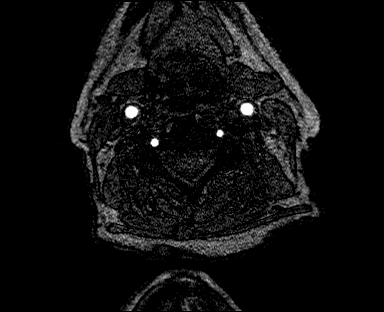
[im 57/133]
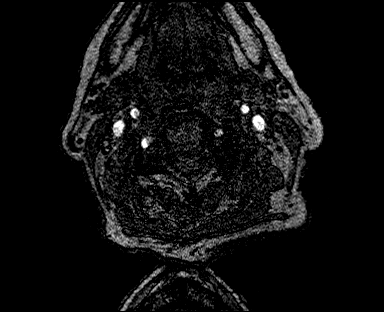
[im 76/133]
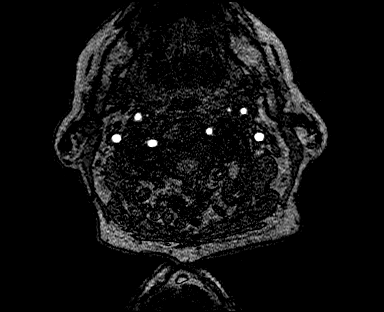
[im 95/133]
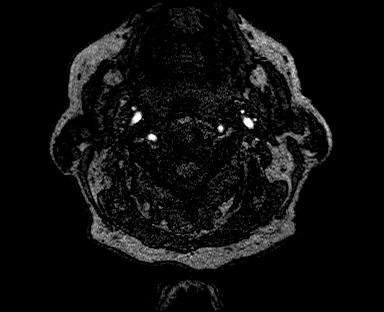
[im 114/133]
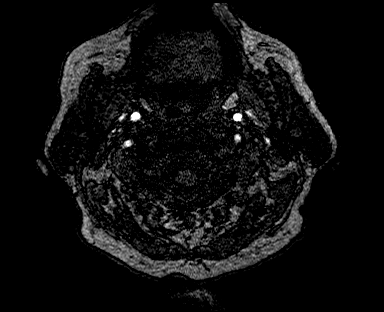
[im 133/133]
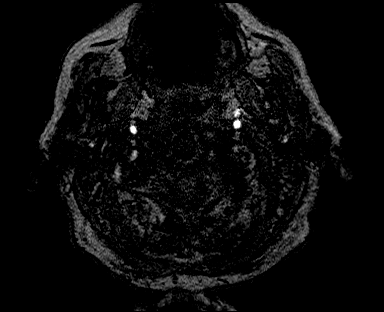

[Series 14: angio_fl3d_cor_pre_ttc=3.0s · coronal · 0.9mm · 0.85mm/px · 5 of 80 slices shown]
[im 1/80]
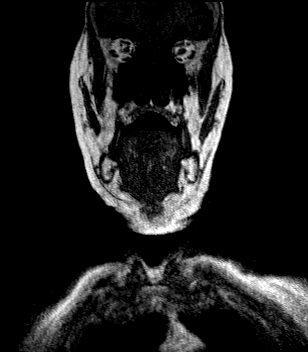
[im 20/80]
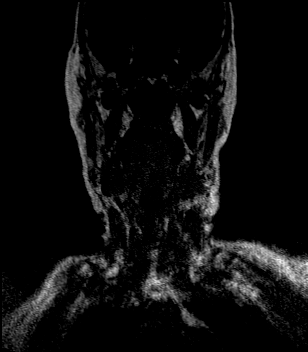
[im 40/80]
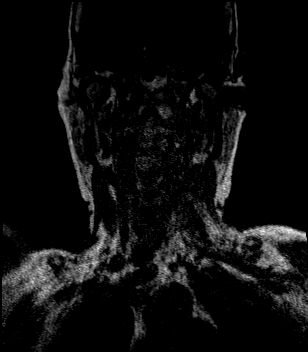
[im 60/80]
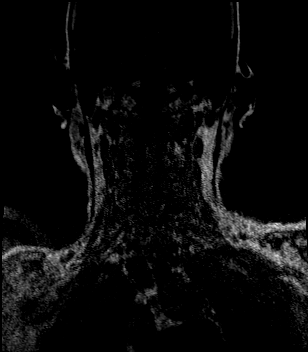
[im 80/80]
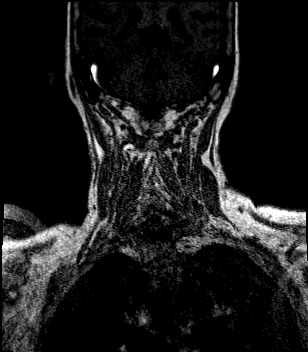

[Series 16: angio_fl3d_cor_post_ttc=3.0s · coronal · 0.9mm · 0.85mm/px · 5 of 80 slices shown (1 of 2)]
[im 1/80]
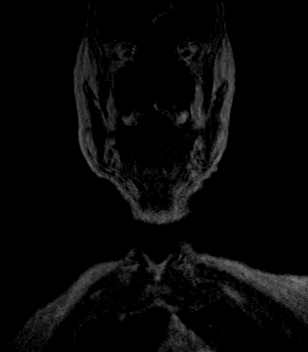
[im 20/80]
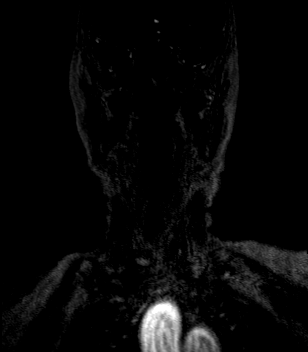
[im 40/80]
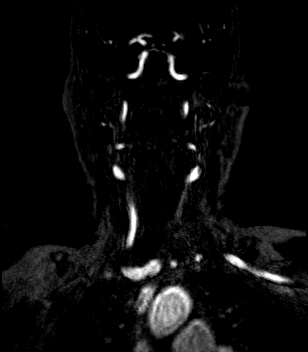
[im 60/80]
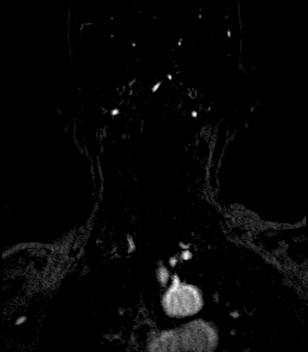
[im 80/80]
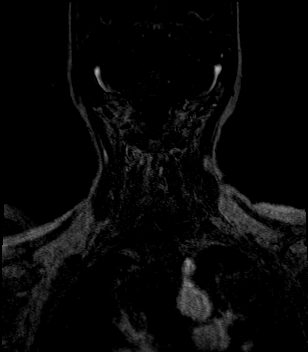

[Series 17: angio_fl3d_cor_post_ttc=3.0s_moco-adv · coronal · 0.9mm · 0.85mm/px · 6 of 80 slices shown (1 of 2)]
[im 1/80]
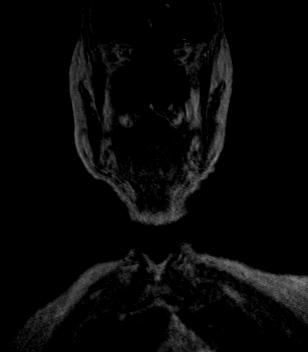
[im 16/80]
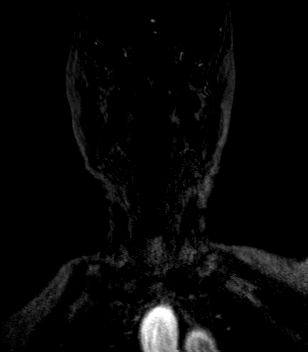
[im 32/80]
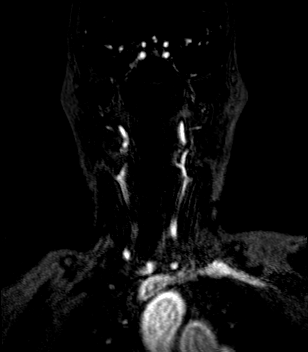
[im 48/80]
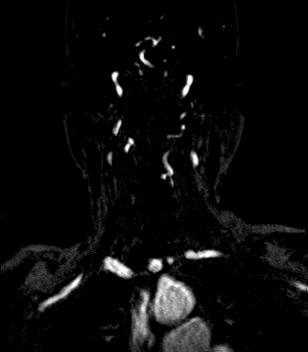
[im 64/80]
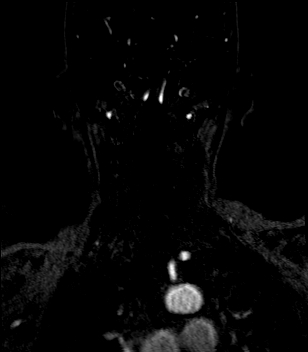
[im 80/80]
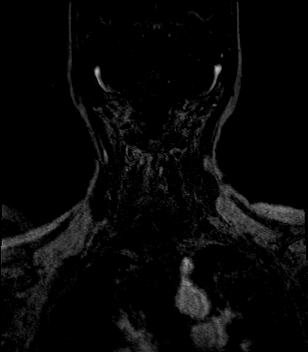

[Series 18: angio_fl3d_cor_post_ttc=3.0s_moco-adv_sub · coronal · 0.9mm · 0.85mm/px · 6 of 80 slices shown (1 of 2)]
[im 1/80]
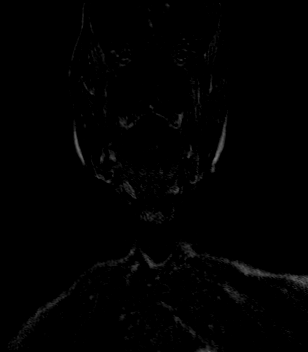
[im 16/80]
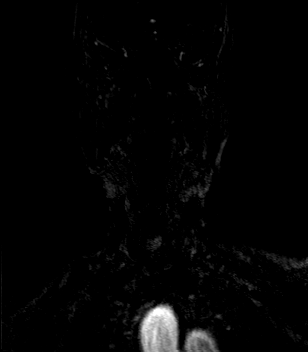
[im 32/80]
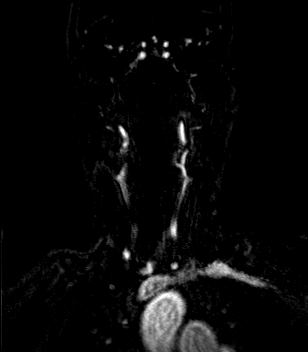
[im 48/80]
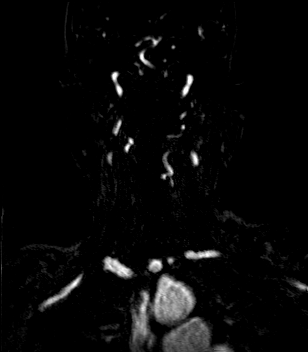
[im 64/80]
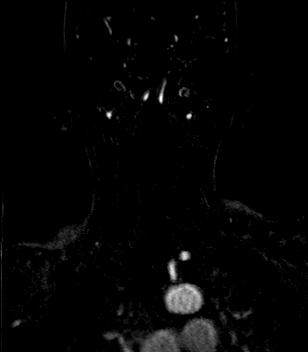
[im 80/80]
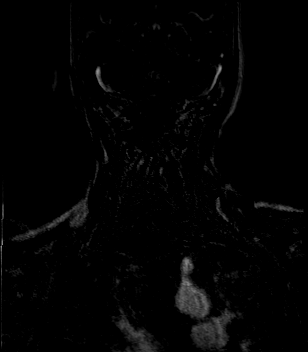

[Series 20: angio_fl3d_cor_post_ttc=3.0s · coronal · 0.9mm · 0.85mm/px · 6 of 80 slices shown (2 of 2)]
[im 1/80]
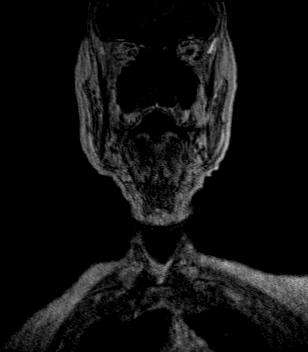
[im 16/80]
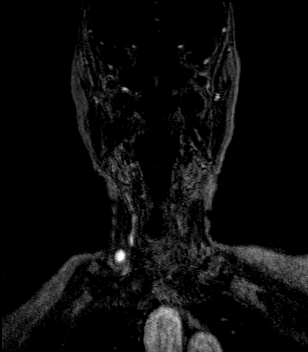
[im 32/80]
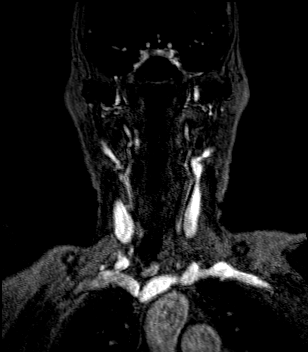
[im 48/80]
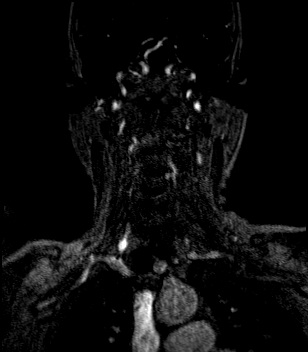
[im 64/80]
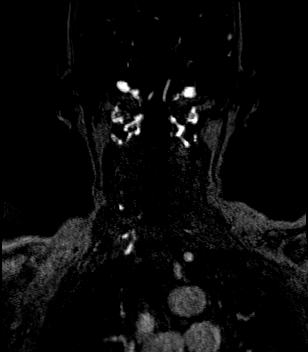
[im 80/80]
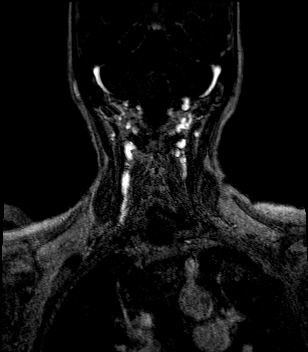

[Series 21: angio_fl3d_cor_post_ttc=3.0s_moco-adv · coronal · 0.9mm · 0.85mm/px · 6 of 80 slices shown (2 of 2)]
[im 1/80]
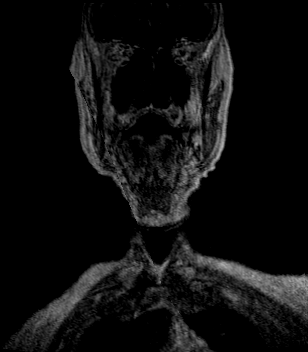
[im 16/80]
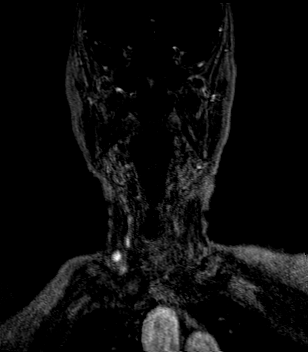
[im 32/80]
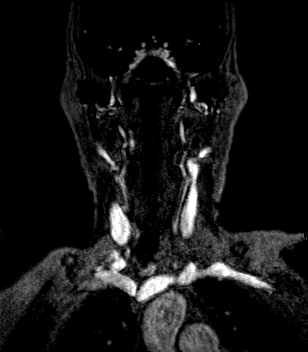
[im 48/80]
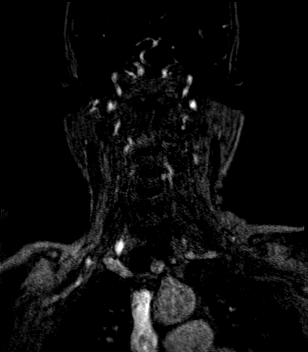
[im 64/80]
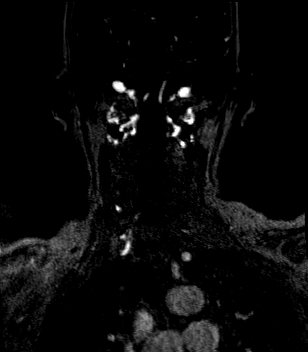
[im 80/80]
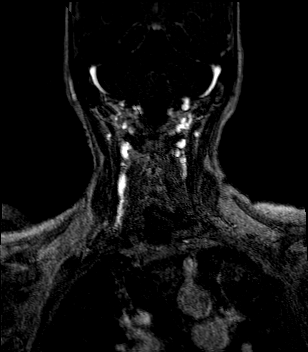

[Series 22: angio_fl3d_cor_post_ttc=3.0s_moco-adv_sub · coronal · 0.9mm · 0.85mm/px · 4 of 80 slices shown (2 of 2)]
[im 1/80]
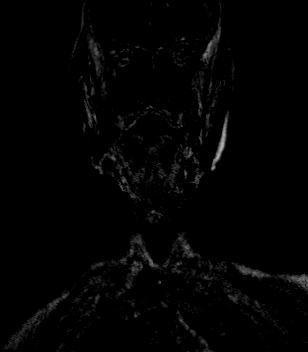
[im 16/80]
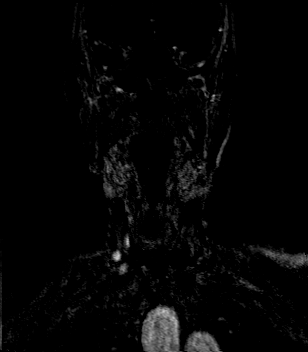
[im 32/80]
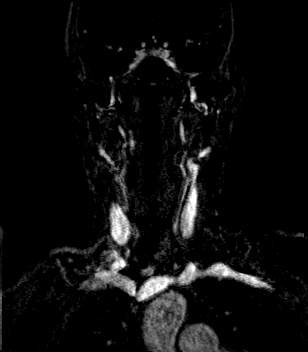
[im 48/80]
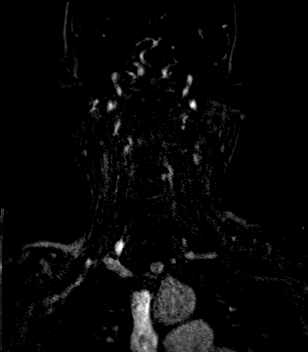

[46 of 48 positions shown; findings below may reference images not displayed]

FINDINGS: Normal 3 vessel aortic arch branching pattern. Vertebral arteries
are normal. Both carotid systems are normal.
IMPRESSION: Normal MRA of the neck.

## 2021-01-16 IMAGING — MR MR [PERSON_NAME] HEAD
6 series · 38 of 48 positions shown · IV contrast (Gadavist)
Comparison: No pertinent prior exam.

CLINICAL DATA: Headache with paresthesias

EXAM:
MR VENOGRAM HEAD WITHOUT AND WITH CONTRAST
TECHNIQUE: Angiographic images of the intracranial venous structures were
acquired using MRV technique without and intravenous contrast.
CONTRAST:  9.5 mL Gadavist

[Series 5: tof_fl2d_paracor · coronal · 2.0mm · 0.98mm/px · 8 of 128 slices shown]
[im 1/128]
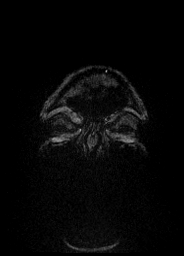
[im 19/128]
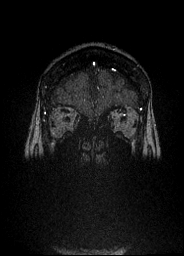
[im 37/128]
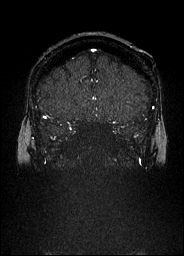
[im 55/128]
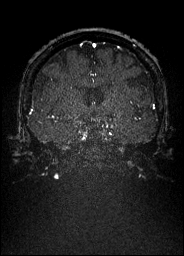
[im 73/128]
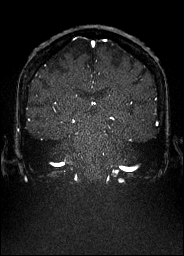
[im 91/128]
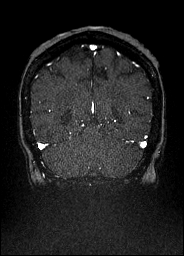
[im 109/128]
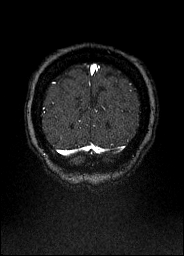
[im 128/128]
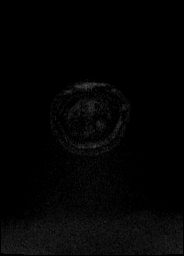

[Series 9: venous inhance coronal · coronal · portal-venous · 0.9mm · 0.57mm/px · 8 of 192 slices shown]
[im 1/192]
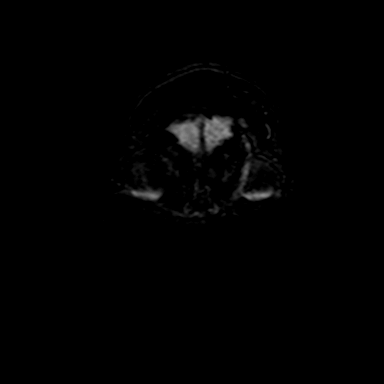
[im 22/192]
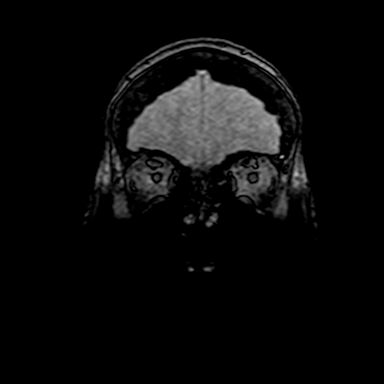
[im 64/192]
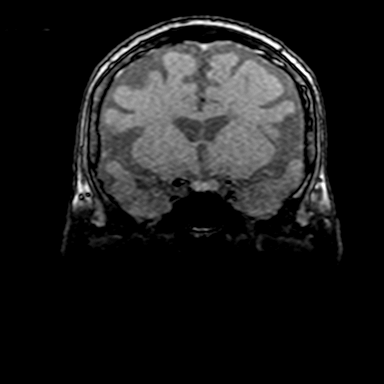
[im 85/192]
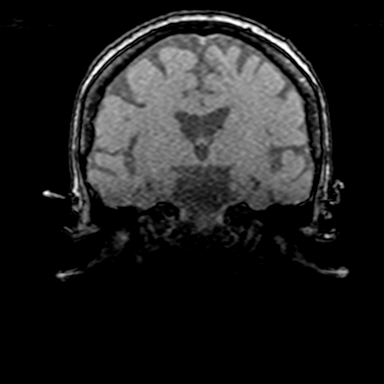
[im 107/192]
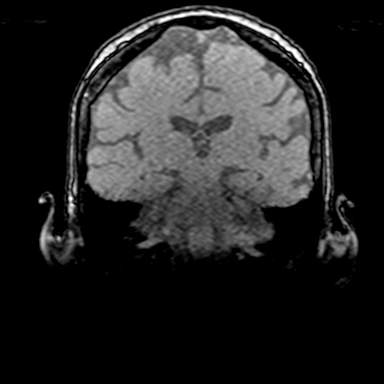
[im 128/192]
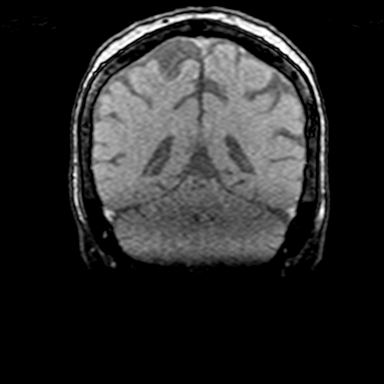
[im 170/192]
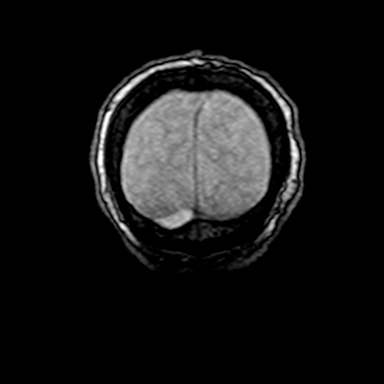
[im 192/192]
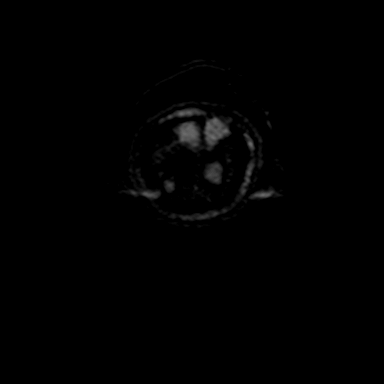

[Series 10: venous inhance coronal_msum · coronal · portal-venous · 0.9mm · 0.57mm/px · 8 of 191 slices shown]
[im 1/191]
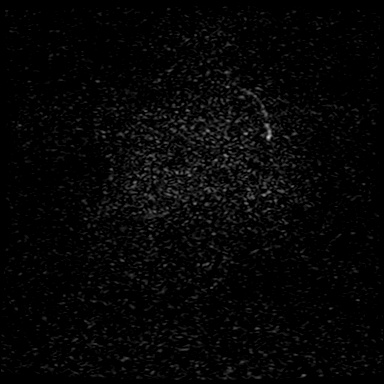
[im 22/191]
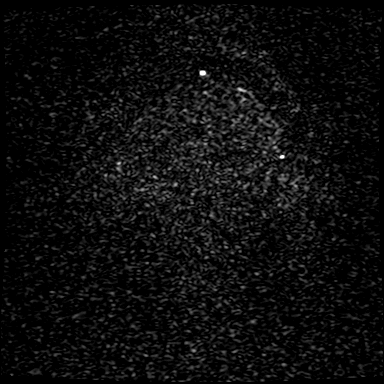
[im 64/191]
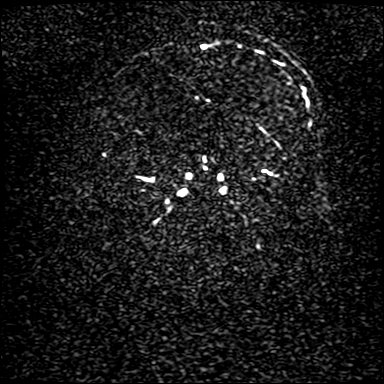
[im 85/191]
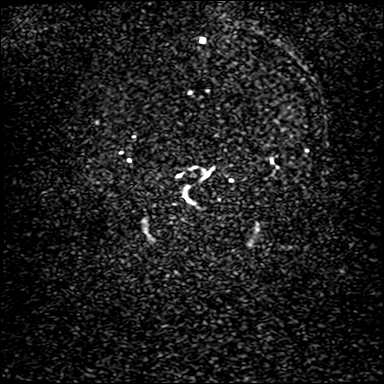
[im 106/191]
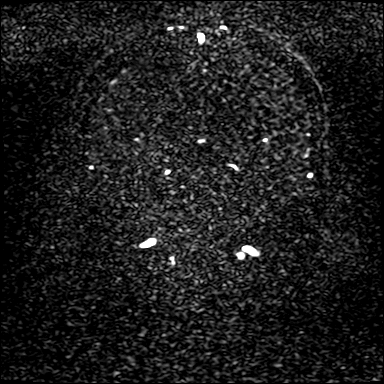
[im 127/191]
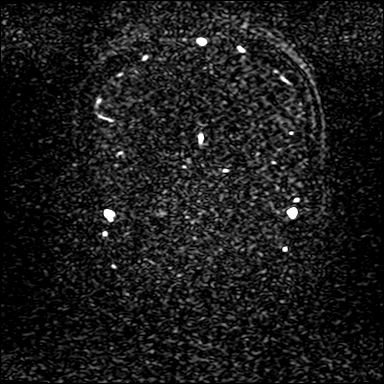
[im 169/191]
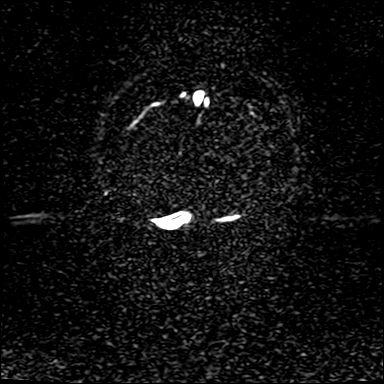
[im 191/191]
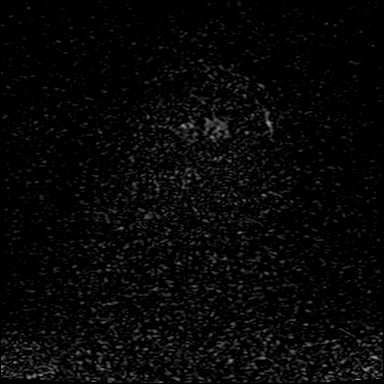

[Series 13: t1_fl3d_sag_p2_iso · sagittal · 1.0mm · 0.98mm/px · 8 of 192 slices shown]
[im 1/192]
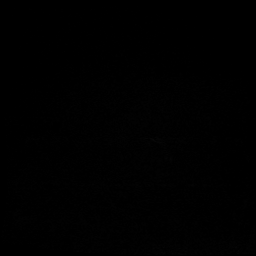
[im 22/192]
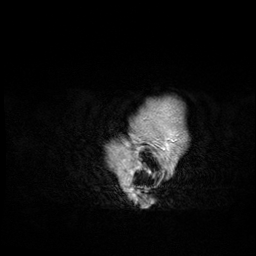
[im 64/192]
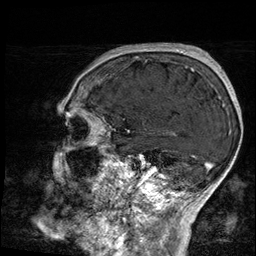
[im 85/192]
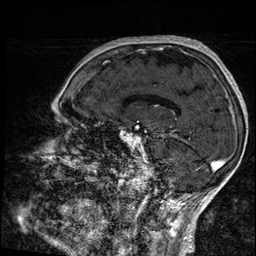
[im 107/192]
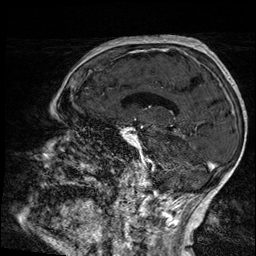
[im 128/192]
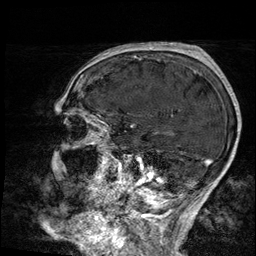
[im 170/192]
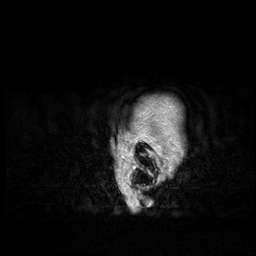
[im 192/192]
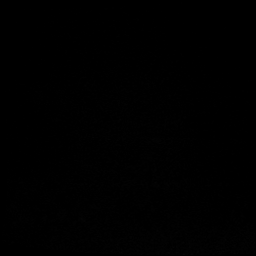

[Series 14: t1_fl3d_sag_p2_iso_mpr_ axial · axial · 2.0mm · 0.45mm/px · z∈[-111,+55]mm · 5 of 86 slices shown]
[im 1/86]
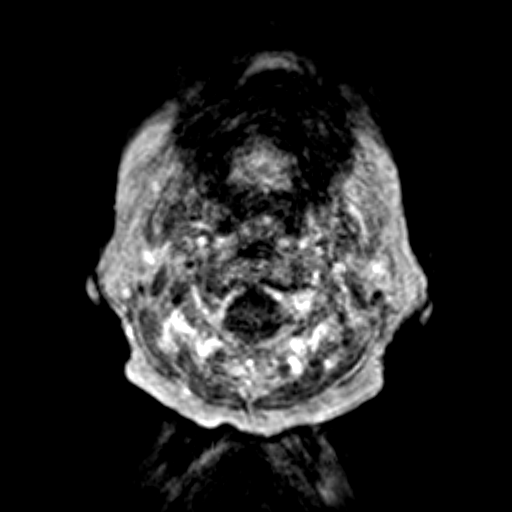
[im 22/86]
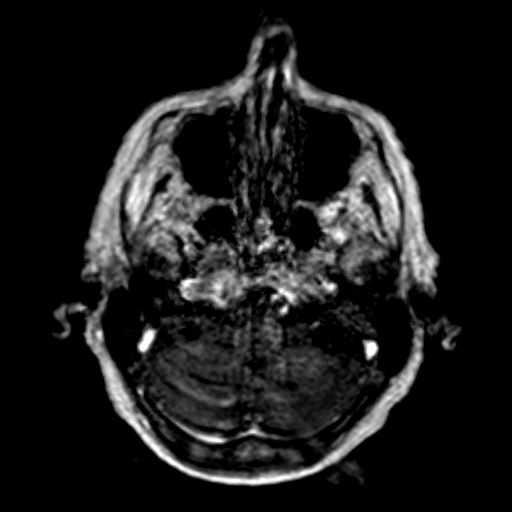
[im 43/86]
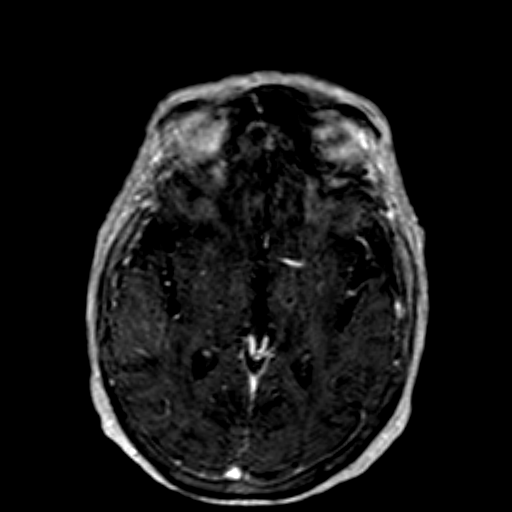
[im 64/86]
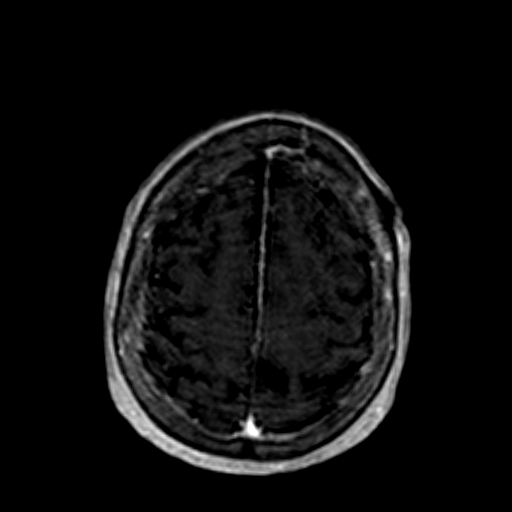
[im 86/86]
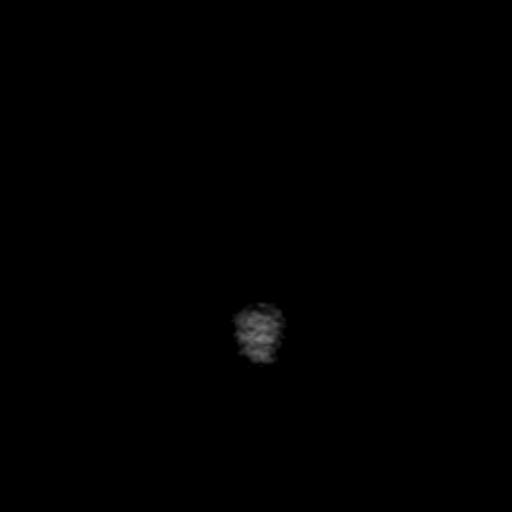

[Series 15: t1_fl3d_sag_p2_iso_mpr_coronal · coronal · 2.0mm · 0.45mm/px · 1 of 90 slices shown]
[im 1/90]
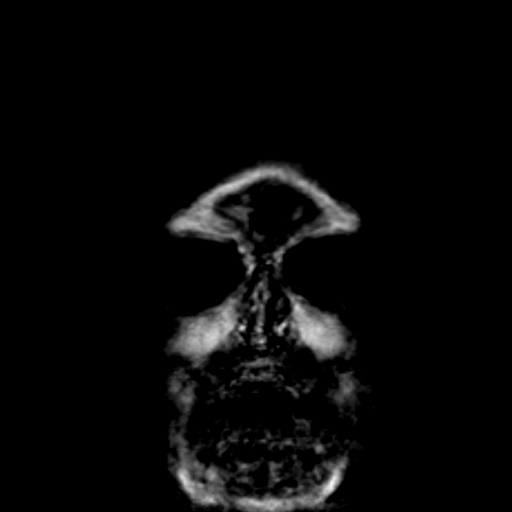

[38 of 48 positions shown; findings below may reference images not displayed]

FINDINGS: Superior sagittal sinus: Normal.

Straight sinus: Normal.

Inferior sagittal sinus, vein of GOVEIA and internal cerebral veins:
Normal.

Transverse sinuses: Normal.

Sigmoid sinuses: Normal.

Visualized jugular veins: Normal.
IMPRESSION: Normal intracranial MRV.

## 2021-01-16 IMAGING — MR MR MRA HEAD W/O CM
1 series · 20 of 48 positions shown · non-contrast
Comparison: No pertinent prior exam.

CLINICAL DATA: Headache and paresthesias

EXAM:
MRA HEAD WITHOUT CONTRAST
TECHNIQUE: Angiographic images of the Circle of Willis were acquired using MRA
technique without intravenous contrast.

[Series 7: 3d cow · axial · 0.5mm · 0.41mm/px · z∈[-81,-0]mm · 20 of 172 slices shown]
[im 1/172]
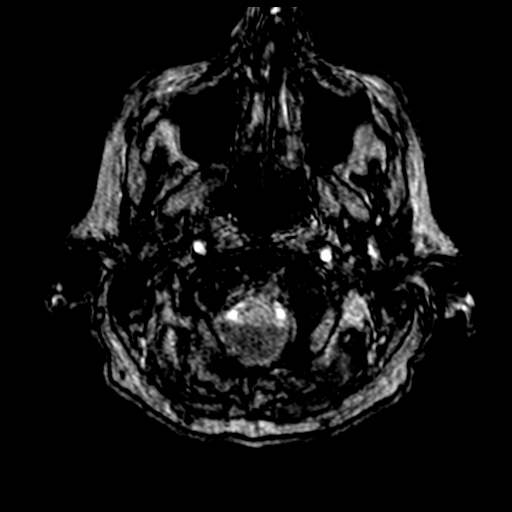
[im 4/172]
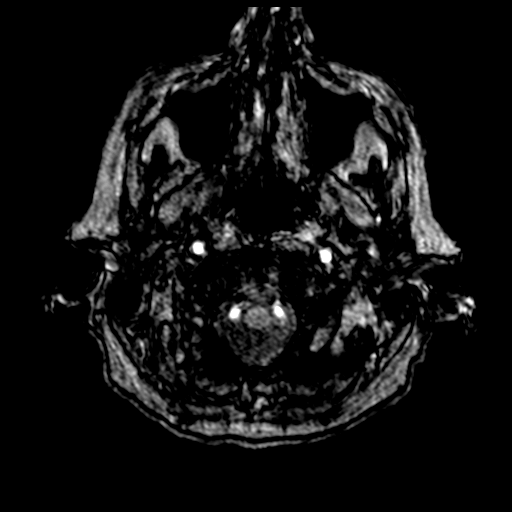
[im 8/172]
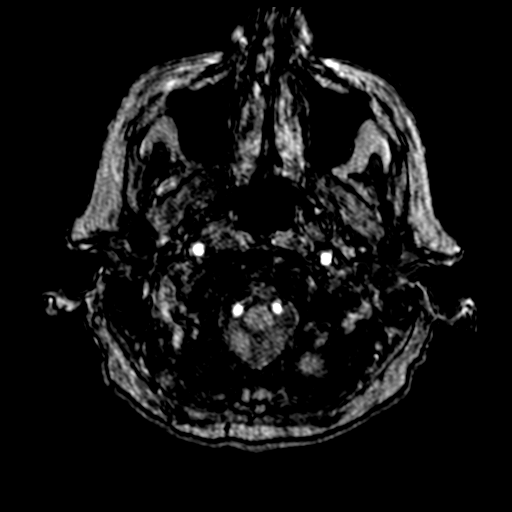
[im 11/172]
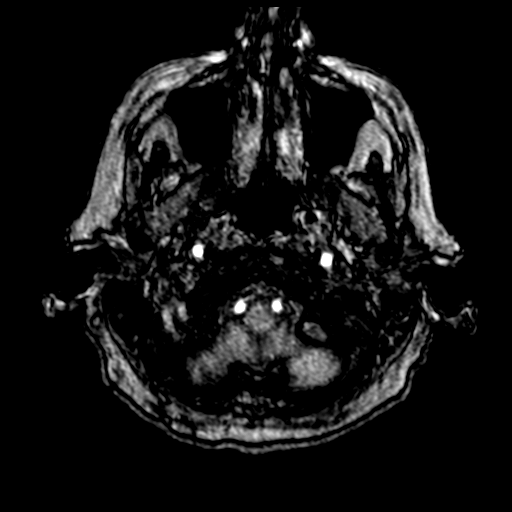
[im 15/172]
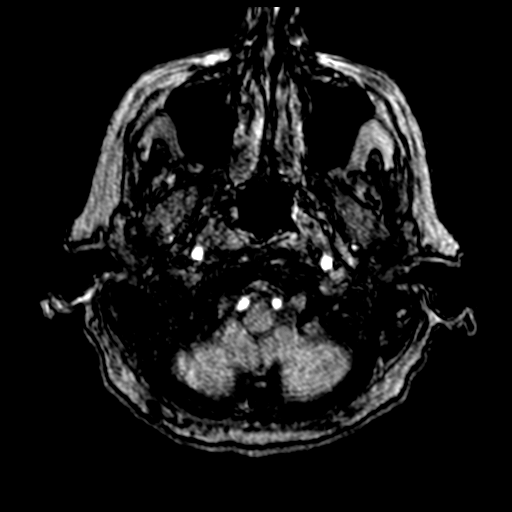
[im 19/172]
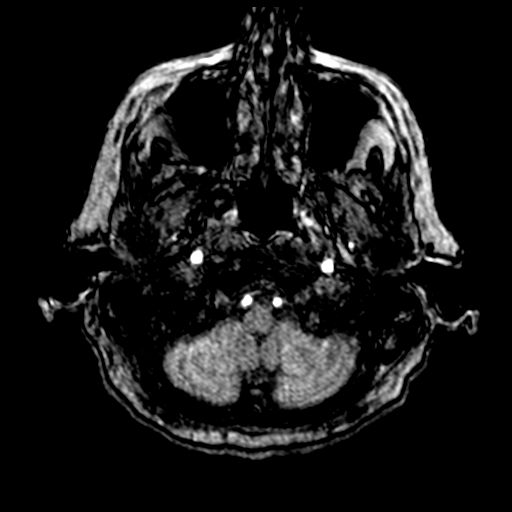
[im 22/172]
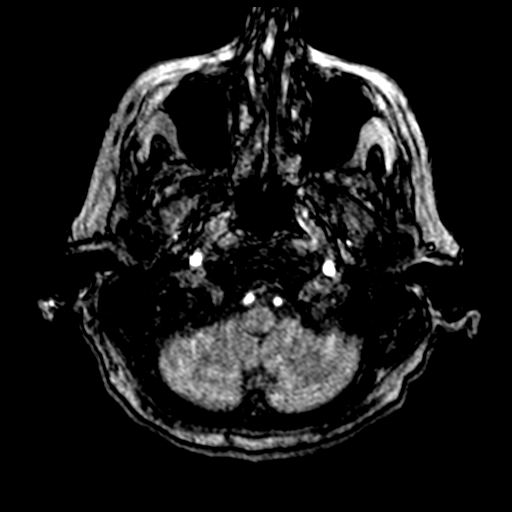
[im 26/172]
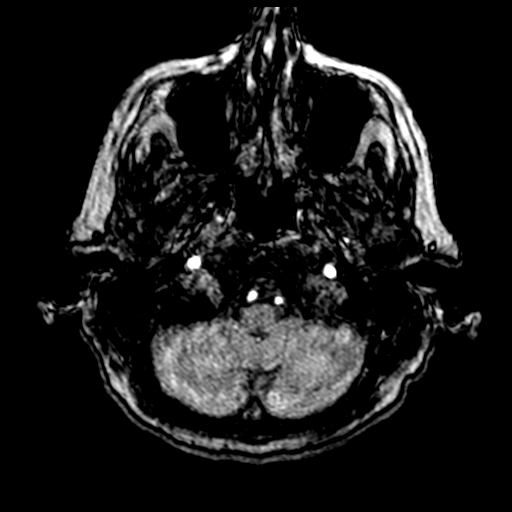
[im 30/172]
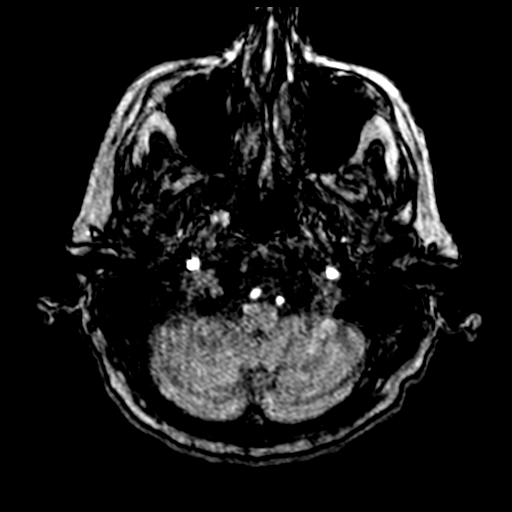
[im 33/172]
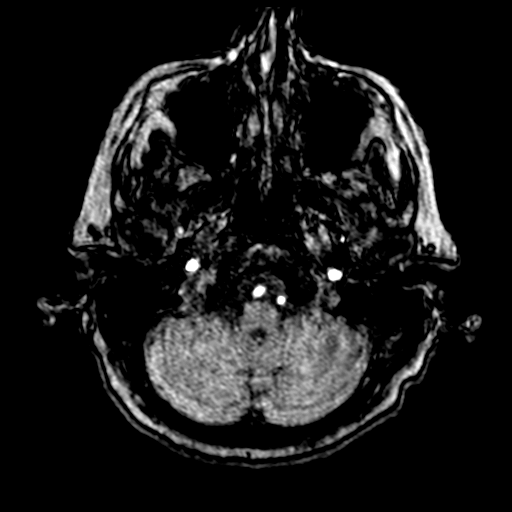
[im 37/172]
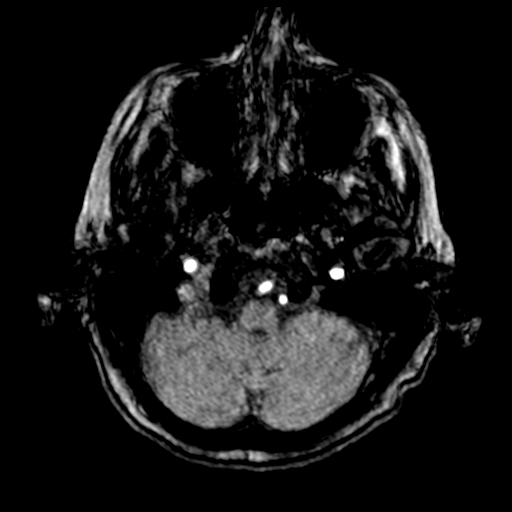
[im 41/172]
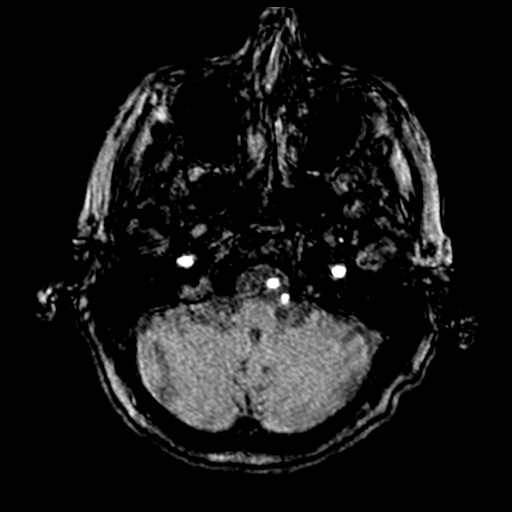
[im 55/172]
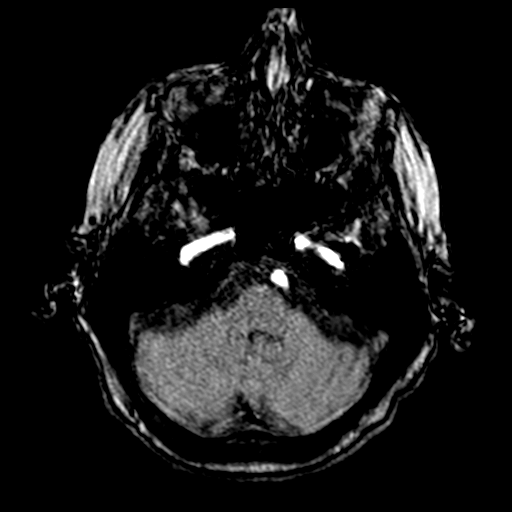
[im 77/172]
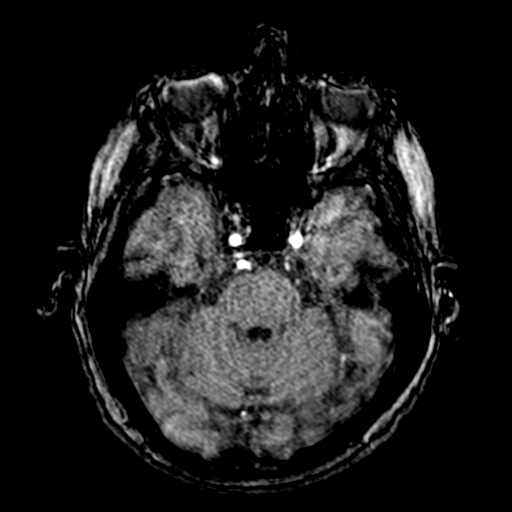
[im 88/172]
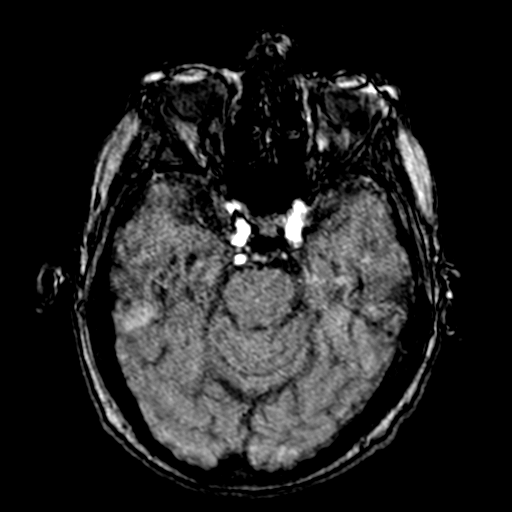
[im 99/172]
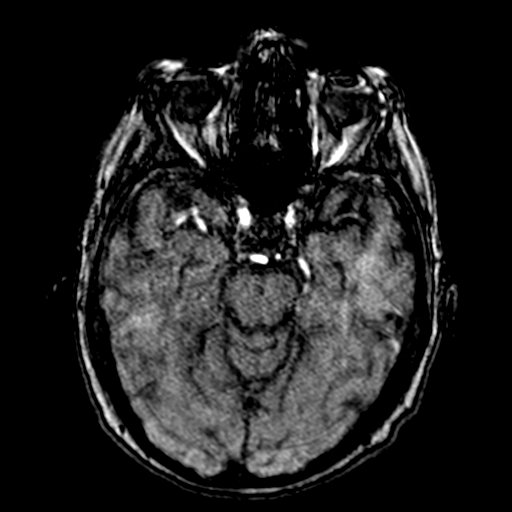
[im 121/172]
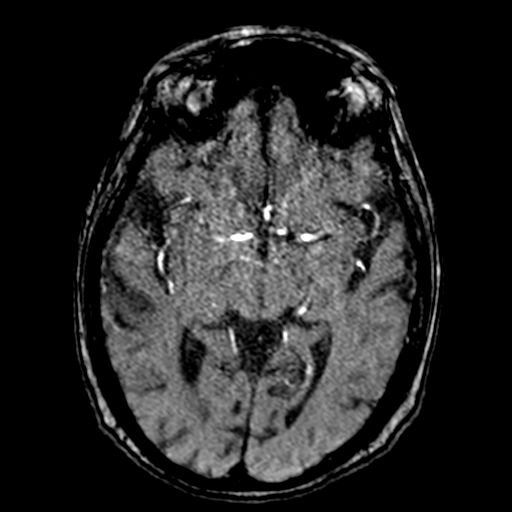
[im 142/172]
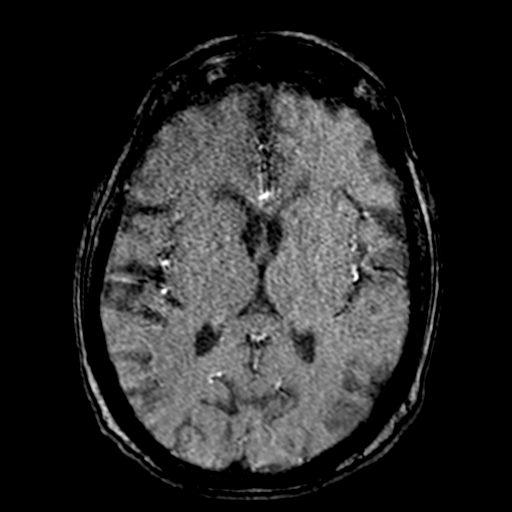
[im 146/172]
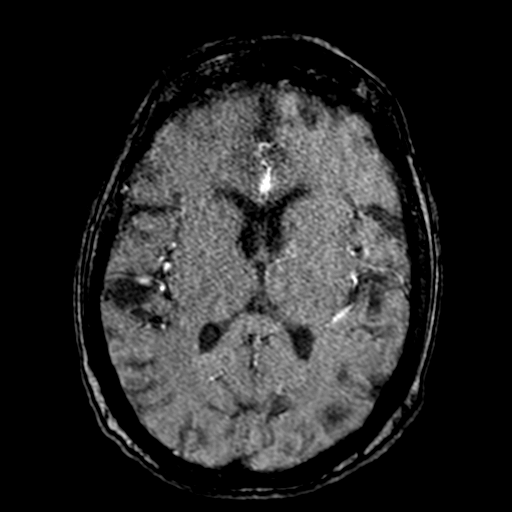
[im 164/172]
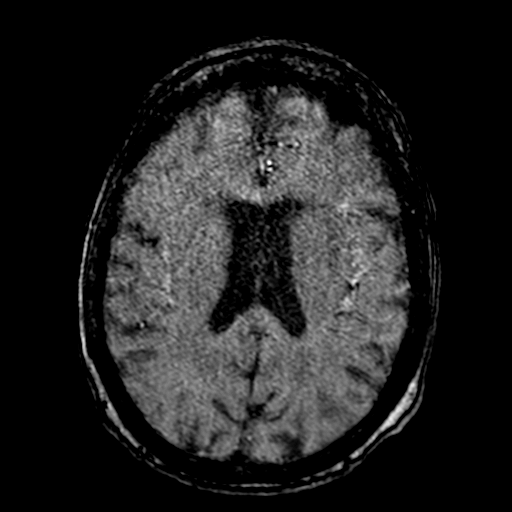

[20 of 48 positions shown; findings below may reference images not displayed]

FINDINGS: Moderate motion degradation.

POSTERIOR CIRCULATION:

--Vertebral arteries: Normal

--Inferior cerebellar arteries: Normal.

--Basilar artery: Normal.

--Superior cerebellar arteries: Normal.

--Posterior cerebral arteries: Normal.

ANTERIOR CIRCULATION:

--Intracranial internal carotid arteries: Normal.

--Anterior cerebral arteries (ACA): Normal.

--Middle cerebral arteries (MCA): Normal.

ANATOMIC VARIANTS: Patent bilateral posterior communicating
arteries.
IMPRESSION: Normal intracranial MRA.

## 2021-01-16 IMAGING — MR MR HEAD W/O CM
2 series · 48 of 48 positions shown · non-contrast
Comparison: None.

CLINICAL DATA: Headaches with paresthesias

EXAM:
MRI HEAD WITHOUT CONTRAST
TECHNIQUE: Multiplanar, multiecho pulse sequences of the brain and surrounding
structures were obtained without intravenous contrast.

[Series 5: DWI · axial · 3.0mm · 0.88mm/px · z∈[-94,+66]mm · 32 of 110 slices shown (1 of 2)]
[im 1/110]
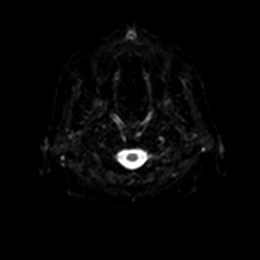
[im 4/110]
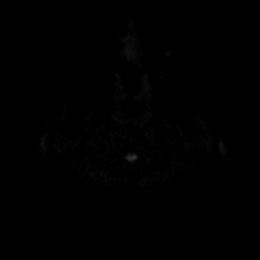
[im 8/110]
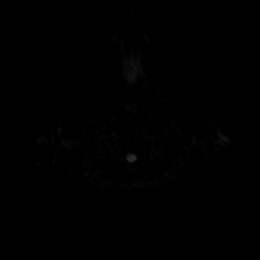
[im 11/110]
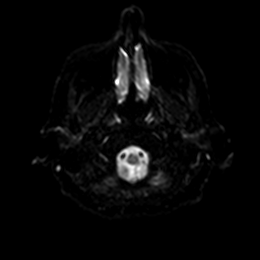
[im 15/110]
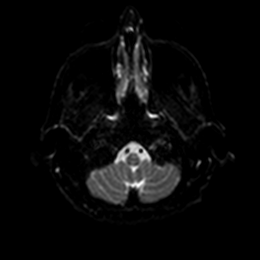
[im 18/110]
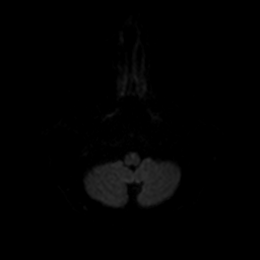
[im 22/110]
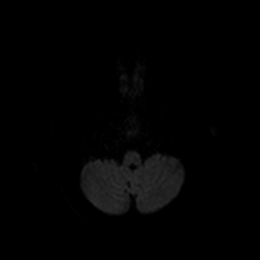
[im 25/110]
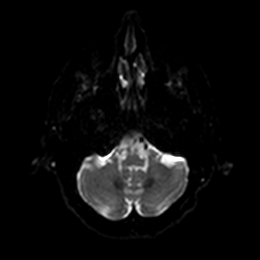
[im 29/110]
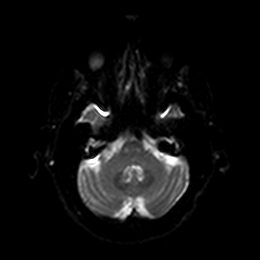
[im 32/110]
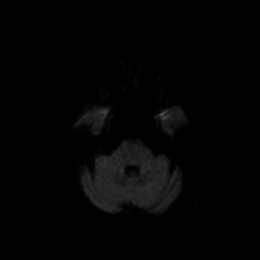
[im 36/110]
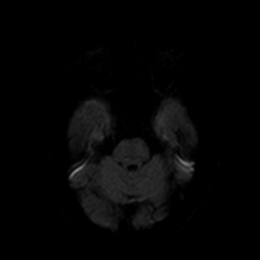
[im 39/110]
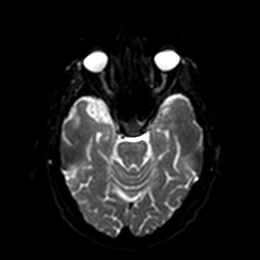
[im 43/110]
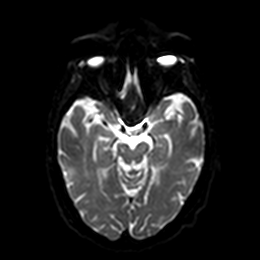
[im 46/110]
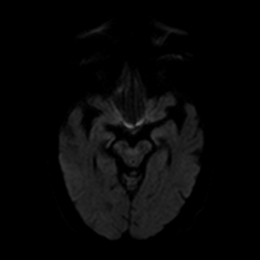
[im 50/110]
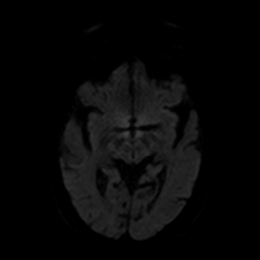
[im 53/110]
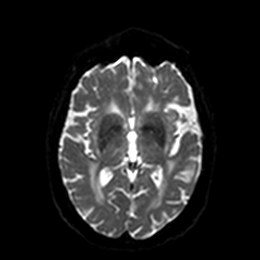
[im 57/110]
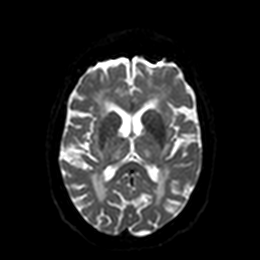
[im 60/110]
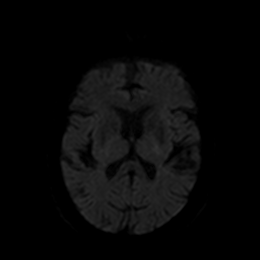
[im 64/110]
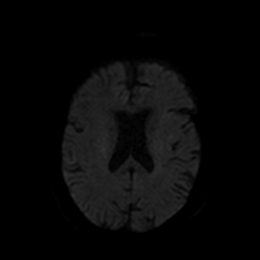
[im 67/110]
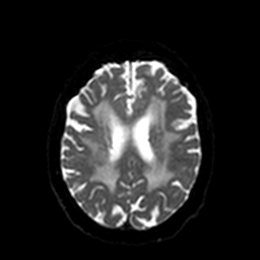
[im 71/110]
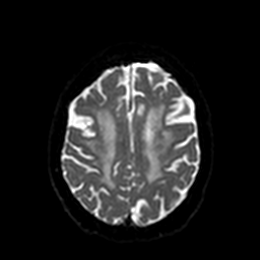
[im 74/110]
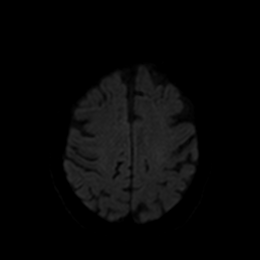
[im 78/110]
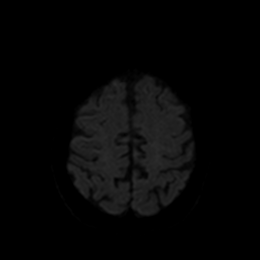
[im 81/110]
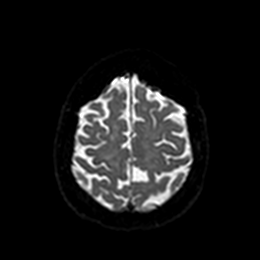
[im 85/110]
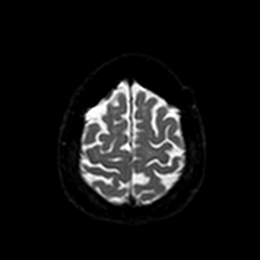
[im 88/110]
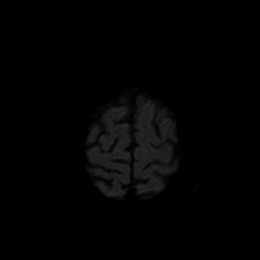
[im 92/110]
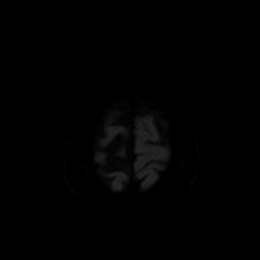
[im 95/110]
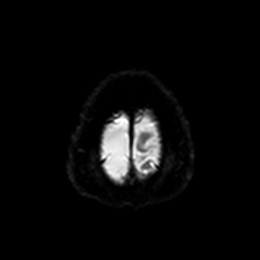
[im 99/110]
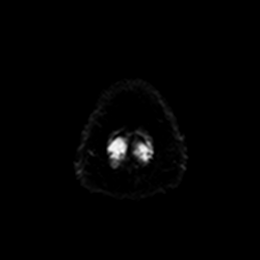
[im 102/110]
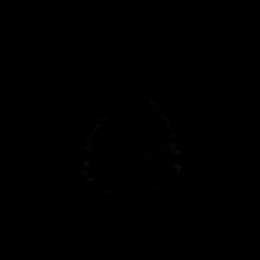
[im 106/110]
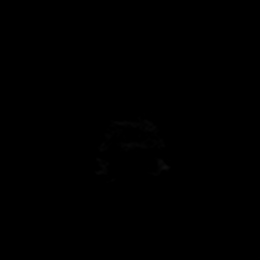
[im 110/110]
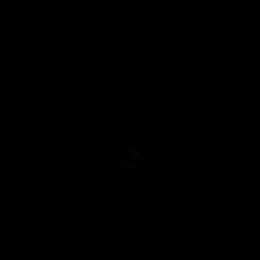

[Series 6: DWI · axial · 3.0mm · 0.88mm/px · z∈[-94,+66]mm · 16 of 55 slices shown (2 of 2)]
[im 1/55]
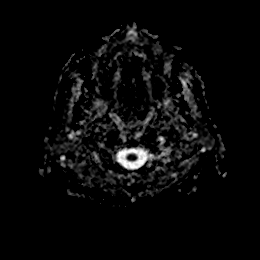
[im 4/55]
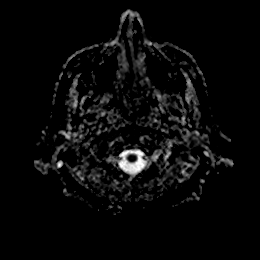
[im 8/55]
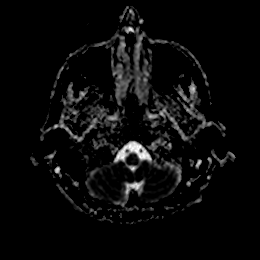
[im 11/55]
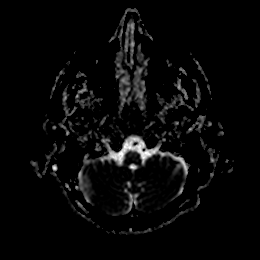
[im 15/55]
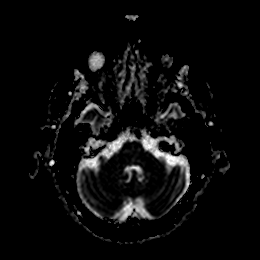
[im 19/55]
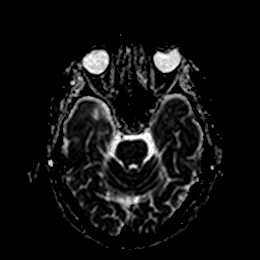
[im 22/55]
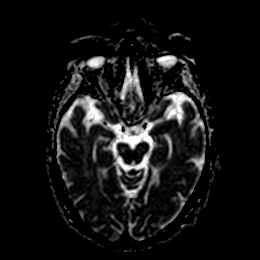
[im 26/55]
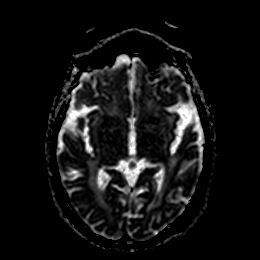
[im 29/55]
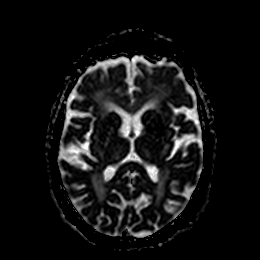
[im 33/55]
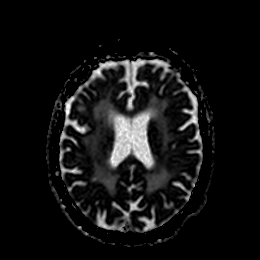
[im 37/55]
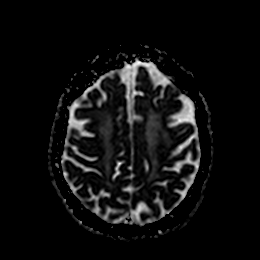
[im 40/55]
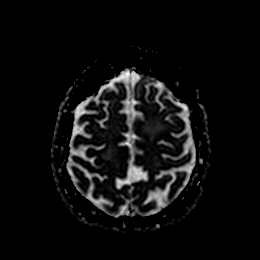
[im 44/55]
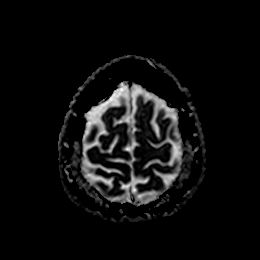
[im 47/55]
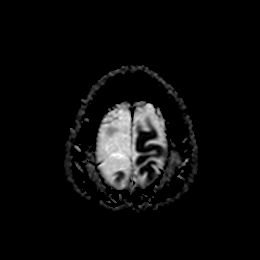
[im 51/55]
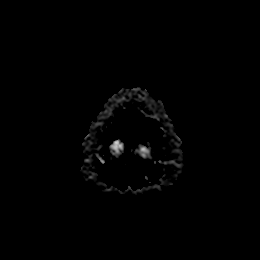
[im 55/55]
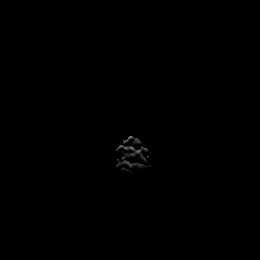

[48 of 48 positions shown; findings below may reference images not displayed]

FINDINGS: Brain: No acute infarct, mass effect or extra-axial collection. No
acute or chronic hemorrhage. Hyperintense T2-weighted signal is
moderately widespread throughout the white matter. Generalized
volume loss without a clear lobar predilection. The midline
structures are normal.

Vascular: Major flow voids are preserved.

Skull and upper cervical spine: Normal calvarium and skull base.
Visualized upper cervical spine and soft tissues are normal.

Sinuses/Orbits:No paranasal sinus fluid levels or advanced mucosal
thickening. No mastoid or middle ear effusion. Normal orbits.
IMPRESSION: 1. No acute intracranial abnormality.
2. Generalized volume loss and findings of chronic small vessel
ischemia.

## 2021-01-16 IMAGING — MR MR HEAD W/O CM
10 of 11 series · 42 of 48 positions shown · non-contrast
Comparison: None.

CLINICAL DATA: Headaches with paresthesias

EXAM:
MRI HEAD WITHOUT CONTRAST
TECHNIQUE: Multiplanar, multiecho pulse sequences of the brain and surrounding
structures were obtained without intravenous contrast.

[Series 5: DWI · coronal · 4.0mm · 0.88mm/px · 6 of 70 slices shown (1 of 2)]
[im 1/70]
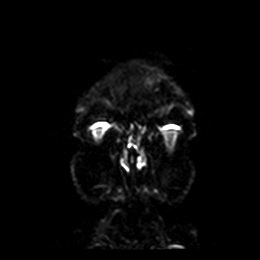
[im 14/70]
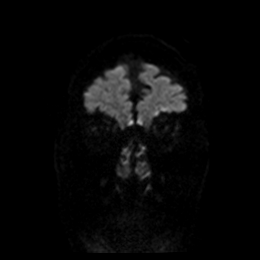
[im 28/70]
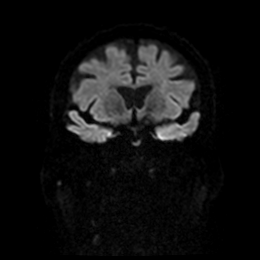
[im 42/70]
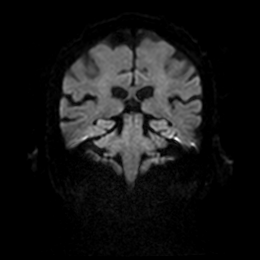
[im 56/70]
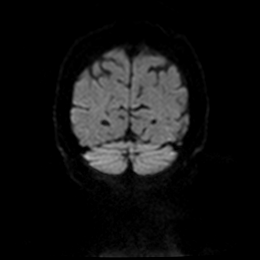
[im 70/70]
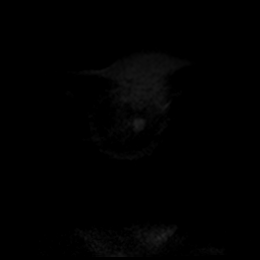

[Series 6: DWI · coronal · 4.0mm · 0.88mm/px · 3 of 35 slices shown (2 of 2)]
[im 1/35]
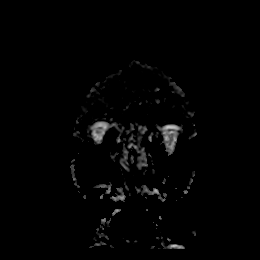
[im 18/35]
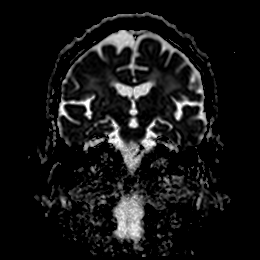
[im 35/35]
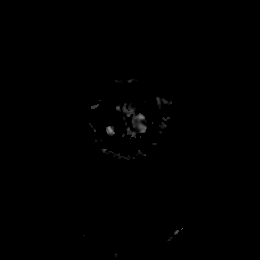

[Series 7: T1 · sagittal · 5.0mm · 0.75mm/px · 2 of 25 slices shown]
[im 1/25]
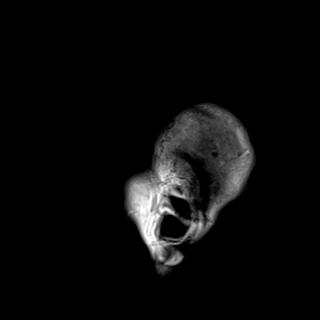
[im 25/25]
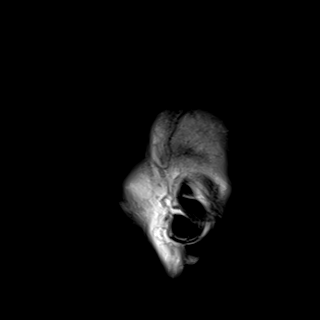

[Series 8: T2 · axial · 5.0mm · 0.72mm/px · z∈[-91,+63]mm · 3 of 27 slices shown (1 of 2)]
[im 1/27]
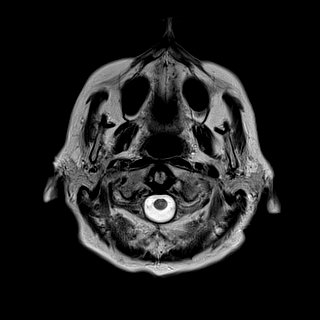
[im 14/27]
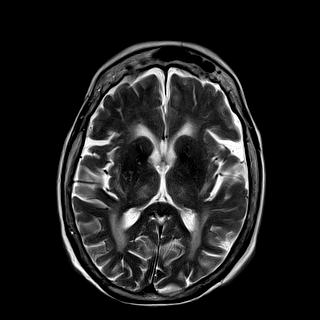
[im 27/27]
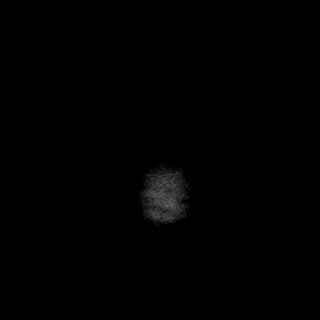

[Series 9: FLAIR · axial · 5.0mm · 0.45mm/px · z∈[-91,+64]mm · 3 of 27 slices shown]
[im 1/27]
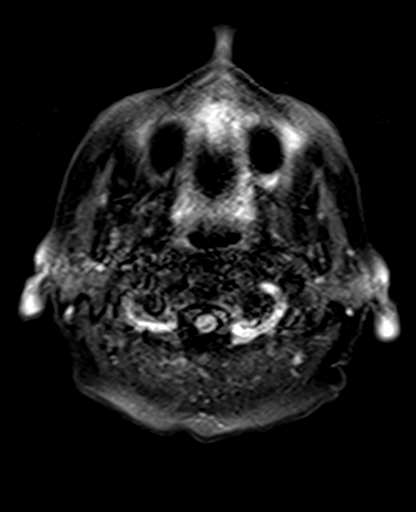
[im 14/27]
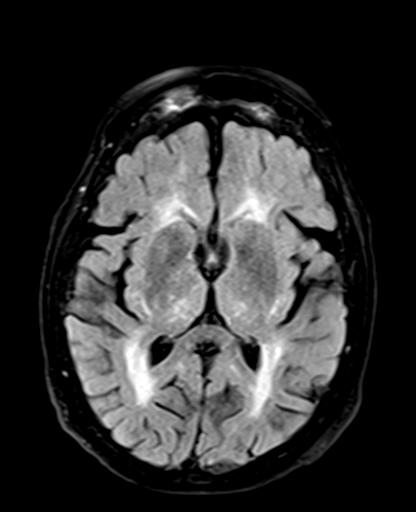
[im 27/27]
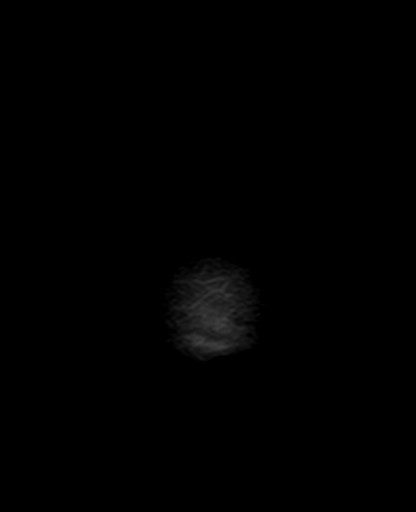

[Series 10: mag_images · axial · 3.0mm · 0.90mm/px · z∈[-95,+68]mm · 6 of 56 slices shown]
[im 1/56]
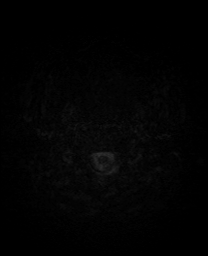
[im 12/56]
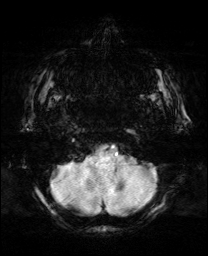
[im 23/56]
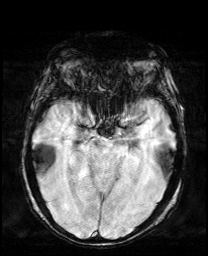
[im 34/56]
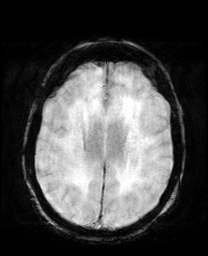
[im 45/56]
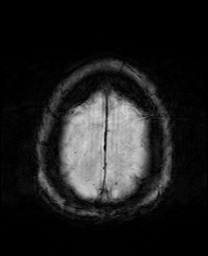
[im 56/56]
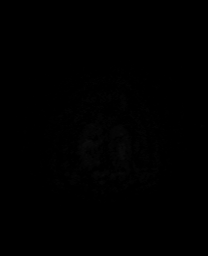

[Series 11: pha_images · axial · 3.0mm · 0.90mm/px · z∈[-95,+68]mm · 5 of 55 slices shown]
[im 1/55]
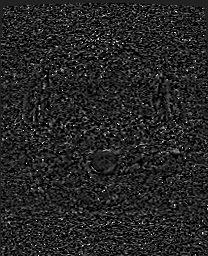
[im 14/55]
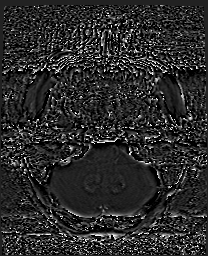
[im 28/55]
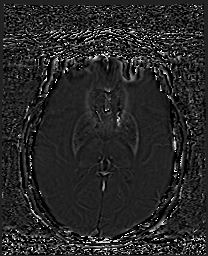
[im 41/55]
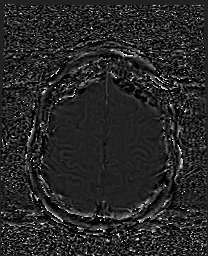
[im 55/55]
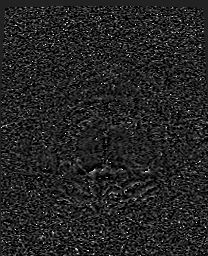

[Series 12: swi_images · axial · 3.0mm · 0.90mm/px · z∈[-95,+68]mm · 6 of 56 slices shown]
[im 1/56]
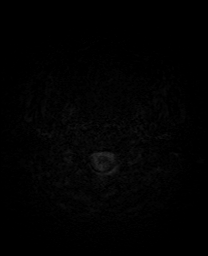
[im 12/56]
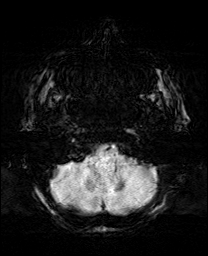
[im 23/56]
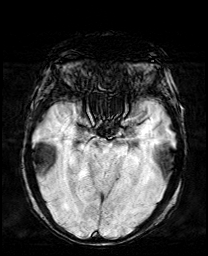
[im 34/56]
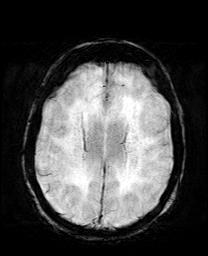
[im 45/56]
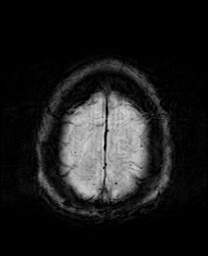
[im 56/56]
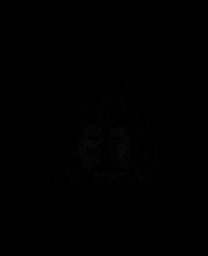

[Series 13: mip_images(sw) · axial · 24.0mm · 0.90mm/px · z∈[-85,+58]mm · 5 of 49 slices shown]
[im 1/49]
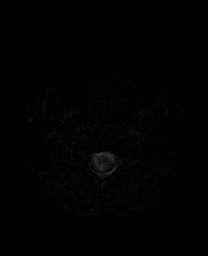
[im 13/49]
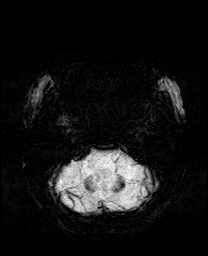
[im 25/49]
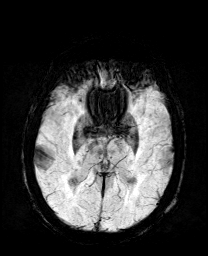
[im 37/49]
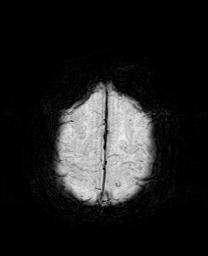
[im 49/49]
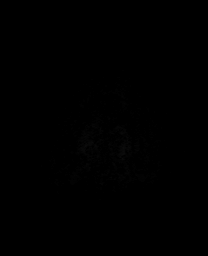

[Series 15: T2 · coronal · 5.0mm · 0.34mm/px · 3 of 29 slices shown (2 of 2)]
[im 1/29]
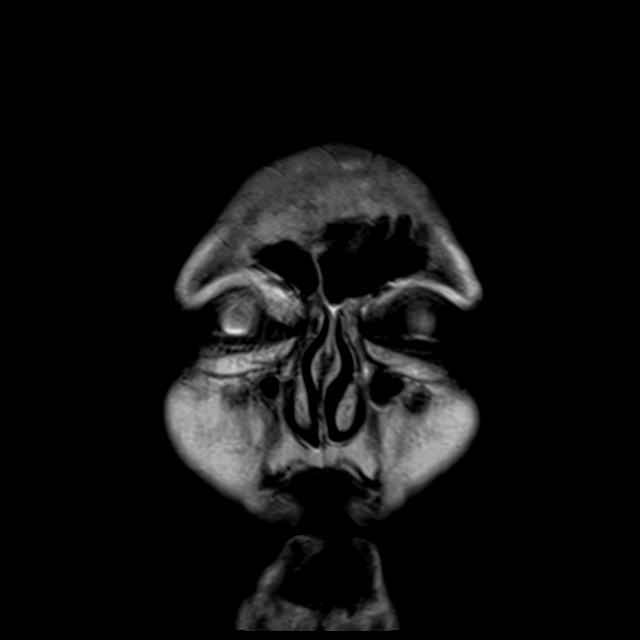
[im 15/29]
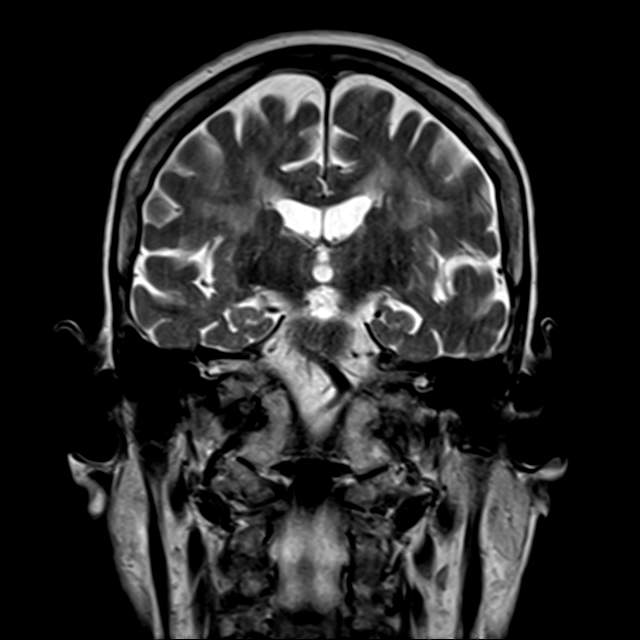
[im 29/29]
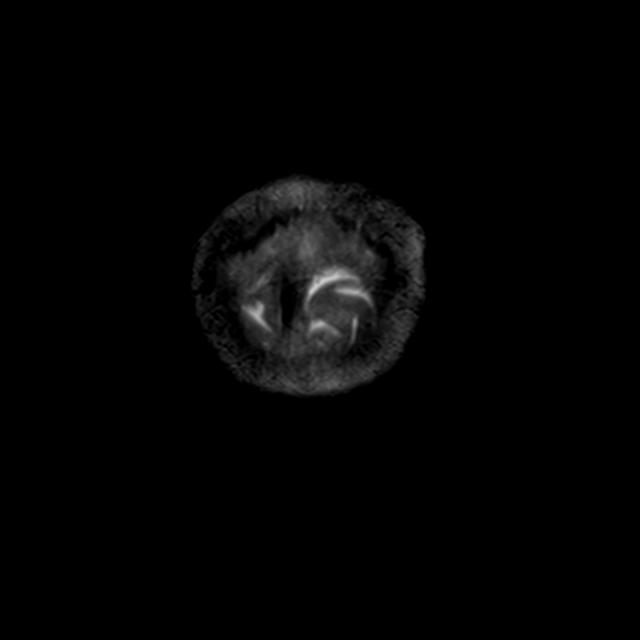

[42 of 48 positions shown; findings below may reference images not displayed]

FINDINGS: Brain: No acute infarct, mass effect or extra-axial collection. No
acute or chronic hemorrhage. Hyperintense T2-weighted signal is
moderately widespread throughout the white matter. Generalized
volume loss without a clear lobar predilection. The midline
structures are normal.

Vascular: Major flow voids are preserved.

Skull and upper cervical spine: Normal calvarium and skull base.
Visualized upper cervical spine and soft tissues are normal.

Sinuses/Orbits:No paranasal sinus fluid levels or advanced mucosal
thickening. No mastoid or middle ear effusion. Normal orbits.
IMPRESSION: 1. No acute intracranial abnormality.
2. Generalized volume loss and findings of chronic small vessel
ischemia.

## 2021-01-16 MED ORDER — GADOBUTROL 1 MMOL/ML IV SOLN
9.5000 mL | Freq: Once | INTRAVENOUS | Status: AC | PRN
  Administered 2021-01-16: 9.5 mL via INTRAVENOUS

## 2021-01-16 NOTE — ED Notes (Signed)
Pt taken to triage via wheelchair via RN. Triage tech aware pt's waiting on family to come get her.

## 2021-01-16 NOTE — ED Notes (Signed)
Pt still in MRI 

## 2021-01-16 NOTE — ED Notes (Signed)
I spoke with pt's daughter Lynelle Smoke about pt being d/c'd. Tammy upset because we don't have answers for pt's sx. I spoke to dr. Ralene Bathe and she said that ER has run all the tests it can and that they recommend to follow up with a neurologist. She said she understood. I told her that none of the pt's meds have changed.

## 2021-01-16 NOTE — ED Notes (Signed)
Pt returned from MRI via stretcher. Pt not hyperventilating anymore and says she feels better after the ativan. Lights dimmed for pt. Pt denies further needs.

## 2021-01-17 LAB — VITAMIN B1: Vitamin B1 (Thiamine): 151.3 nmol/L (ref 66.5–200.0)

## 2021-01-19 ENCOUNTER — Other Ambulatory Visit: Payer: Self-pay

## 2021-01-20 ENCOUNTER — Ambulatory Visit (INDEPENDENT_AMBULATORY_CARE_PROVIDER_SITE_OTHER): Payer: Medicare Other | Admitting: Internal Medicine

## 2021-01-20 VITALS — BP 130/90 | HR 83 | Temp 98.0°F | Wt 203.6 lb

## 2021-01-20 DIAGNOSIS — M25561 Pain in right knee: Secondary | ICD-10-CM

## 2021-01-20 DIAGNOSIS — Z09 Encounter for follow-up examination after completed treatment for conditions other than malignant neoplasm: Secondary | ICD-10-CM | POA: Diagnosis not present

## 2021-01-20 DIAGNOSIS — I10 Essential (primary) hypertension: Secondary | ICD-10-CM | POA: Diagnosis not present

## 2021-01-20 DIAGNOSIS — M25562 Pain in left knee: Secondary | ICD-10-CM

## 2021-01-20 DIAGNOSIS — G8929 Other chronic pain: Secondary | ICD-10-CM

## 2021-01-20 DIAGNOSIS — I9589 Other hypotension: Secondary | ICD-10-CM | POA: Diagnosis not present

## 2021-01-20 DIAGNOSIS — E861 Hypovolemia: Secondary | ICD-10-CM

## 2021-01-20 NOTE — Progress Notes (Signed)
Established Patient Office Visit     This visit occurred during the SARS-CoV-2 public health emergency.  Safety protocols were in place, including screening questions prior to the visit, additional usage of staff PPE, and extensive cleaning of exam room while observing appropriate contact time as indicated for disinfecting solutions.    CC/Reason for Visit: Hospital follow-up  HPI: Charlotte Castro is a 87 y.o. female who is coming in today for the above mentioned reasons.  She has multiple medical conditions including hypertension, A. fib, chronic diastolic heart failure, hypothyroidism and recurrent UTIs.  She was hospitalized from August 2 through August 4 due to chest pain.  Cardiac work-up was negative.  She also had dizziness and was found to be orthostatic which resolved prior to discharge.  She is known to withhold p.o. fluids throughout the day due to concerns with going frequently to the bathroom.  She has significant chronic venous insufficiency and TED hose was recommended which she is not wearing today.  She is complaining of significant bilateral knee pain which is chronic.  She believes this makes it more difficult for her to get up and go to the bathroom as well.  Past Medical/Surgical History: Past Medical History:  Diagnosis Date   Anemia    history of   Anxiety    Arthritis    "left knee" (04/05/2017)   Atrial fibrillation (HCC)    Atrial flutter ([place])    typical appearing   Atrial tachycardia ([place])    ablated 11/17/10  by JA  from the [facility] of the aorta   CHF (congestive heart failure) (HCC)    Chronic lower back pain    Chronic nausea    Endometrial cancer (HCC)    grade 1   Gallstone pancreatitis    Gastritis    GERD (gastroesophageal reflux disease)    History of hiatal hernia    HTN (hypertension)    Hyperlipemia    Hypothyroidism    Obesity    Osteoporosis    Toe ulcer ([place])    left 3rd toe   Vitamin D deficiency     Past Surgical History:   Procedure Laterality Date   ATRIAL ABLATION SURGERY  11/17/10   Atrial tachycardia arising from [facility] of the aorta ablated by [place]  01/11/2011   Archie Endo 01/12/2011   CHOLECYSTECTOMY N/A 04/08/2017   Procedure: LAPAROSCOPIC CHOLECYSTECTOMY;  Surgeon: Georganna Skeans, MD;  Location: [place] [facility];  Service: General;  Laterality: N/A;   FRACTURE SURGERY     HYSTEROSCOPY WITH D & C N/A 08/05/2017   Procedure: DILATATION AND CURETTAGE /HYSTEROSCOPY AND POLYPECTOMY;  Surgeon: Osborne Oman, MD;  Location: [facility];  Service: Gynecology;  Laterality: N/A;   LUMBAR SPINE SURGERY  ~ [place] SALPINGO OOPHERECTOMY Bilateral 09/20/2017   Procedure: XI ROBOTIC ASSISTED TOTAL HYSTERECTOMY WITH BILATERAL SALPINGO OOPHORECTOMY, SENTINAL LYMPH NODE BIOPSY;  Surgeon: Everitt Amber, MD;  Location: [facility];  Service: Gynecology;  Laterality: Bilateral;   SHOULDER SURGERY Right 1984 X2   /ntoes 01/19/2011   WRIST FRACTURE SURGERY Left 1983    Social History:  reports that she has never smoked. She has never used smokeless tobacco. She reports that she does not drink alcohol and does not use drugs.  Allergies: Allergies  Allergen Reactions   Iodinated Diagnostic Agents Shortness Of Breath and Other (See Comments)    "Allergic," per [facility]- Causes  headaches, also   Zosyn [Piperacillin Sod-Tazobactam So] Other (See Comments)    "Allergic," per [facility]   Penicillins Other (See Comments)    Tolerated Zosyn Oct 2018, but "Allergic," per [facility] Did it involve swelling of the face/tongue/throat, SOB, or low BP? Unk Did it involve sudden or severe rash/hives, skin peeling, or any reaction on the inside of your mouth or nose? Unk Did you need to seek medical attention at a hospital or doctor's office? Unk When did it last happen? Unk If all above answers are "NO", may proceed with cephalosporin use.     Family History:  Family History  Problem  Relation Age of Onset   Heart attack Father    Hypertension Father      Current Outpatient Medications:    acetaminophen (TYLENOL) 650 MG CR tablet, Take 650 mg by mouth 2 (two) times daily., Disp: , Rfl:    gabapentin (NEURONTIN) 100 MG capsule, Take 1 capsule (100 mg total) by mouth 2 (two) times daily., Disp: 60 capsule, Rfl: 0   levothyroxine (SYNTHROID) 88 MCG tablet, TAKE ONE TABLET DAILY BEFORE BREAKFAST (Patient taking differently: Take 88 mcg by mouth daily before breakfast.), Disp: 90 tablet, Rfl: 1   metoprolol tartrate (LOPRESSOR) 50 MG tablet, TAKE ONE TABLET TWICE DAILY WITH A MEAL (Patient taking differently: Take 50 mg by mouth 2 (two) times daily with a meal.), Disp: 180 tablet, Rfl: 1   vitamin B-12 (CYANOCOBALAMIN) 1000 MCG tablet, Take 5,000 mcg by mouth See admin instructions. Take 5,000 mcg by mouth one to two times a day, Disp: , Rfl:    [START ON 01/21/2021] Vitamin D, Ergocalciferol, (DRISDOL) 1.25 MG (50000 UNIT) CAPS capsule, Take 1 capsule (50,000 Units total) by mouth every Wednesday for 4 days., Disp: 5 capsule, Rfl: 0   warfarin (COUMADIN) 5 MG tablet, TAKE AS DIRECTED BY COUMADIN CLINIC (Patient taking differently: Take 2.5-5 mg by mouth See admin instructions. Take 5 mg by mouth in the morning on Sun/Mon/Wed/Thurs/Fri and 2.5 mg on Tues/Sat), Disp: 30 tablet, Rfl: 1   pantoprazole (PROTONIX) 40 MG tablet, Take 1 tablet (40 mg total) by mouth daily for 14 days. (Patient not taking: Reported on 01/20/2021), Disp: 14 tablet, Rfl: 0  Review of Systems:  Constitutional: Denies fever, chills, diaphoresis, appetite change.  HEENT: Denies photophobia, eye pain, redness, hearing loss, ear pain, congestion, sore throat, rhinorrhea, sneezing, mouth sores, trouble swallowing, neck pain, neck stiffness and tinnitus.   Respiratory: Denies SOB, DOE, cough, chest tightness,  and wheezing.   Cardiovascular: Denies chest pain, palpitations. Gastrointestinal: Denies nausea, vomiting,  abdominal pain, diarrhea, constipation, blood in stool and abdominal distention.  Genitourinary: Denies dysuria, urgency, frequency, hematuria, flank pain and difficulty urinating.  Endocrine: Denies: hot or cold intolerance, sweats, changes in hair or nails, polyuria, polydipsia. Musculoskeletal: Positive for myalgias, back pain, joint swelling, arthralgias and gait problem.  Skin: Denies pallor, rash and wound.  Neurological: Denies  seizures, syncope, numbness and headaches.  Hematological: Denies adenopathy. Easy bruising, personal or family bleeding history  Psychiatric/Behavioral: Denies suicidal ideation, mood changes, confusion, nervousness, sleep disturbance and agitation    Physical Exam: Vitals:   01/20/21 0928  BP: 130/90  Pulse: 83  Temp: 98 F (36.7 C)  TempSrc: Oral  SpO2: 98%  Weight: 203 lb 9.6 oz (92.4 kg)    Body mass index is 30.07 kg/m.   Constitutional: NAD, calm, comfortable, ambulates with a cane Eyes: PERRL, lids and conjunctivae normal ENMT: Mucous membranes are moist.  Respiratory:  clear to auscultation bilaterally, no wheezing, no crackles. Normal respiratory effort. No accessory muscle use.  Cardiovascular: Regular rate and rhythm, no murmurs / rubs / gallops.  1+ pitting bilateral lower extremity edema.  Psychiatric: Normal judgment and insight. Alert and oriented x 3. Normal mood.    Impression and Plan:  Hospital discharge follow-up  Hypotension due to hypovolemia  Essential hypertension, benign  Chronic pain of both knees  [facility] charts have been reviewed in detail. -We have discussed importance of staying hydrated, I will send a referral to orthopedics to see if may be cortisone injections in her knees might help relieve pain. -Have also advised that she go to a medical supply store and be fitted for compression stockings as recommended by the hospital.  Time spent: 31 minutes reviewing chart, interviewing patient and son,  examining patient and formulating plan of care.   Patient Instructions  -Nice seeing you today!!  -Schedule follow up in 3 months for your physical. Please come in fasting that day.  -Remember to drink plenty of fluids thruout the day and to get fitted for compression stockings.  -referral to orthopedics for your knee pain has been requested.    Lelon Frohlich, MD [facility]

## 2021-01-20 NOTE — Patient Instructions (Signed)
-  Nice seeing you today!!  -Schedule follow up in 3 months for your physical. Please come in fasting that day.  -Remember to drink plenty of fluids thruout the day and to get fitted for compression stockings.  -referral to orthopedics for your knee pain has been requested.

## 2021-01-27 ENCOUNTER — Ambulatory Visit: Payer: Self-pay

## 2021-01-27 ENCOUNTER — Ambulatory Visit (INDEPENDENT_AMBULATORY_CARE_PROVIDER_SITE_OTHER): Payer: Medicare Other | Admitting: Orthopaedic Surgery

## 2021-01-27 ENCOUNTER — Ambulatory Visit (INDEPENDENT_AMBULATORY_CARE_PROVIDER_SITE_OTHER): Payer: Medicare Other

## 2021-01-27 ENCOUNTER — Other Ambulatory Visit: Payer: Self-pay

## 2021-01-27 ENCOUNTER — Encounter: Payer: Self-pay | Admitting: Orthopaedic Surgery

## 2021-01-27 DIAGNOSIS — M25562 Pain in left knee: Secondary | ICD-10-CM | POA: Diagnosis not present

## 2021-01-27 DIAGNOSIS — M1711 Unilateral primary osteoarthritis, right knee: Secondary | ICD-10-CM

## 2021-01-27 DIAGNOSIS — M25561 Pain in right knee: Secondary | ICD-10-CM

## 2021-01-27 DIAGNOSIS — G8929 Other chronic pain: Secondary | ICD-10-CM | POA: Diagnosis not present

## 2021-01-27 DIAGNOSIS — M1712 Unilateral primary osteoarthritis, left knee: Secondary | ICD-10-CM

## 2021-01-27 MED ORDER — METHYLPREDNISOLONE ACETATE 40 MG/ML IJ SUSP
40.0000 mg | INTRAMUSCULAR | Status: AC | PRN
  Administered 2021-01-27: 40 mg via INTRA_ARTICULAR

## 2021-01-27 MED ORDER — LIDOCAINE HCL 1 % IJ SOLN
3.0000 mL | INTRAMUSCULAR | Status: AC | PRN
  Administered 2021-01-27: 3 mL

## 2021-01-27 NOTE — Progress Notes (Signed)
Office Visit Note   Patient: Charlotte Castro           Date of Birth: 08-Apr-1933           MRN: [id number] Visit Date: 01/27/2021              Requested by: Isaac Bliss, Rayford Halsted, MD [place],  [place] [id number] PCP: Isaac Bliss, Rayford Halsted, MD   Assessment & Plan: Visit Diagnoses:  1. Chronic pain of right knee   2. Chronic pain of left knee   3. Unilateral primary osteoarthritis, right knee   4. Unilateral primary osteoarthritis, left knee     Plan: Given her comorbidities I recommended at least a steroid injection in both knees to temporize the pain in her knees.  Based on her comorbidities she is not a surgical candidate right now for knee replacement surgery.  She understands this as well.  She should work on quad strengthening exercises and try to increase her mobility.  She understands we can always reinject her knees in 3 months if needed.  All questions and concerns were answered and addressed.  Follow-Up Instructions: Return in about 3 months (around 04/29/2021).   Orders:  Orders Placed This Encounter  Procedures  . Large Joint Inj  . Large Joint Inj  . XR Knee 1-2 Views Left  . XR Knee 1-2 Views Right   No orders of the defined types were placed in this encounter.     Procedures: Large Joint Inj: R knee on 01/27/2021 8:28 AM Indications: diagnostic evaluation and pain Details: 22 G 1.5 in needle, superolateral approach  Arthrogram: No  Medications: 3 mL lidocaine 1 %; 40 mg methylPREDNISolone acetate 40 MG/ML Outcome: tolerated well, no immediate complications Procedure, treatment alternatives, risks and benefits explained, specific risks discussed. Consent was given by the patient. Immediately prior to procedure a time out was called to verify the correct patient, procedure, equipment, support staff and site/side marked as required. Patient was prepped and draped in the usual sterile fashion.    Large Joint Inj: L knee on  01/27/2021 8:28 AM Indications: diagnostic evaluation and pain Details: 22 G 1.5 in needle, superolateral approach  Arthrogram: No  Medications: 3 mL lidocaine 1 %; 40 mg methylPREDNISolone acetate 40 MG/ML Outcome: tolerated well, no immediate complications Procedure, treatment alternatives, risks and benefits explained, specific risks discussed. Consent was given by the patient. Immediately prior to procedure a time out was called to verify the correct patient, procedure, equipment, support staff and site/side marked as required. Patient was prepped and draped in the usual sterile fashion.      Clinical Data: No additional findings.   Subjective: Chief Complaint  Patient presents with  . Right Knee - Pain  . Left Knee - Pain  The patient comes in today for evaluation treatment of chronic bilateral knee pain.  She ambulates with a cane and she actually drove her self today.  She is 87 years old and I was able to review her medical records from where she has been in and out of the hospital in the ER recently with a lot of comorbidities and issues.  She says her knees hurt on a daily basis.  She has known arthritis she states in both knees and they do crack and pop to her and makes it difficult for her actives daily living and mobility in general.  Her pain is daily and it is significant in both knees.  She is also  on Coumadin for chronic atrial fibrillation.  She actually does not drive and lives alone but has family support.  She drove herself today to this visit.  HPI  Review of Systems Today she denies any headache, chest pain, shortness of breath, fever, chills, nausea, vomiting  Objective: Vital Signs: There were no vitals taken for this visit.  Physical Exam She is alert and oriented x3 and in no acute distress Ortho Exam She does ambulate slowly with her 4-prong cane.  Both knees have valgus malalignment and patellofemoral rotation with global tenderness and pain throughout  the arc of motion. Specialty Comments:  No specialty comments available.  Imaging: XR Knee 1-2 Views Left  Result Date: 01/27/2021 2 views of left knee show significant tricompartment arthritis with valgus malalignment and significant narrowing of the lateral joint space and patellofemoral joint.  XR Knee 1-2 Views Right  Result Date: 01/27/2021 2 views of the right knee show valgus malalignment with tricompartment arthritis most significant in the lateral and patellofemoral joint.    PMFS History: Patient Active Problem List   Diagnosis Date Noted  . Chest pain 01/13/2021  . Orthostasis 01/13/2021  . Abdominal pain   . Acute cystitis without hematuria   . Hiatal hernia 01/29/2019  . AKI (acute kidney injury) ([place]) 01/14/2019  . B12 deficiency 08/08/2018  . Hypothyroidism 08/08/2018  . Endometrial cancer ([place]) 08/05/2017  . Toe ulcer, left, with unspecified severity ([place]) 08/04/2017  . 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 ([place]) 08/04/2017  . Postmenopausal bleeding 08/01/2017  . Epigastric abdominal pain 04/05/2017  . Delirium 04/14/2016  . Tremor 04/14/2016  . Chronic diastolic CHF (congestive heart failure) ([place]) 04/10/2016  . Non-intractable vomiting 10/09/2015  . Essential hypertension, benign 01/18/2014  . Long term current use of anticoagulant therapy 01/18/2014  . Permanent atrial fibrillation ([place]) 06/10/2011   Past Medical History:  Diagnosis Date  . Anemia    history of  . Anxiety   . Arthritis    "left knee" (04/05/2017)  . Atrial fibrillation ([place])   . Atrial flutter ([place])    typical appearing  . Atrial tachycardia ([place])    ablated 11/17/10  by JA  from the [facility] of the aorta  . CHF (congestive heart failure) ([place])   . Chronic lower back pain   . Chronic nausea   . Endometrial cancer (HCC)    grade 1  . Gallstone pancreatitis   . Gastritis   . GERD (gastroesophageal reflux disease)   . History of hiatal hernia   . HTN  (hypertension)   . Hyperlipemia   . Hypothyroidism   . Obesity   . Osteoporosis   . Toe ulcer ([place])    left 3rd toe  . Vitamin D deficiency     Family History  Problem Relation Age of Onset  . Heart attack Father   . Hypertension Father     Past Surgical History:  Procedure Laterality Date  . ATRIAL ABLATION SURGERY  11/17/10   Atrial tachycardia arising from [facility] of the aorta ablated by JA  . BACK SURGERY    . CARDIAC CATHETERIZATION  01/11/2011   Archie Endo 01/12/2011  . CHOLECYSTECTOMY N/A 04/08/2017   Procedure: LAPAROSCOPIC CHOLECYSTECTOMY;  Surgeon: Georganna Skeans, MD;  Location: [facility];  Service: General;  Laterality: N/A;  . FRACTURE SURGERY    . HYSTEROSCOPY WITH D & C N/A 08/05/2017   Procedure: DILATATION AND CURETTAGE /HYSTEROSCOPY AND POLYPECTOMY;  Surgeon: Osborne Oman, MD;  Location: [facility];  Service: Gynecology;  Laterality: N/A;  . LUMBAR SPINE SURGERY  ~ 1998  . ROBOTIC ASSISTED TOTAL HYSTERECTOMY WITH BILATERAL SALPINGO OOPHERECTOMY Bilateral 09/20/2017   Procedure: XI ROBOTIC ASSISTED TOTAL HYSTERECTOMY WITH BILATERAL SALPINGO OOPHORECTOMY, SENTINAL LYMPH NODE BIOPSY;  Surgeon: Everitt Amber, MD;  Location: [facility];  Service: Gynecology;  Laterality: Bilateral;  . SHOULDER SURGERY Right 1984 X2   Rollene Rotunda 01/19/2011  . WRIST FRACTURE SURGERY Left 1983   Social History   Occupational History  . Not on file  Tobacco Use  . Smoking status: Never  . Smokeless tobacco: Never  Vaping Use  . Vaping Use: Never used  Substance and Sexual Activity  . Alcohol use: No  . Drug use: No  . Sexual activity: Never    Birth control/protection: Post-menopausal

## 2021-02-12 ENCOUNTER — Ambulatory Visit: Payer: Medicare Other | Admitting: Interventional Cardiology

## 2021-02-12 ENCOUNTER — Other Ambulatory Visit: Payer: Self-pay | Admitting: Internal Medicine

## 2021-02-12 DIAGNOSIS — E039 Hypothyroidism, unspecified: Secondary | ICD-10-CM

## 2021-02-19 ENCOUNTER — Ambulatory Visit (INDEPENDENT_AMBULATORY_CARE_PROVIDER_SITE_OTHER): Payer: Medicare Other

## 2021-02-19 ENCOUNTER — Other Ambulatory Visit: Payer: Self-pay

## 2021-02-19 DIAGNOSIS — Z5181 Encounter for therapeutic drug level monitoring: Secondary | ICD-10-CM | POA: Diagnosis not present

## 2021-02-19 DIAGNOSIS — I4821 Permanent atrial fibrillation: Secondary | ICD-10-CM | POA: Diagnosis not present

## 2021-02-19 LAB — POCT INR: INR: 1.9 — AB (ref 2.0–3.0)

## 2021-02-19 NOTE — Patient Instructions (Addendum)
Description   Take 1.5 tablets today and then continue taking Warfarin 1 tablet daily except for 1/2 tablet on Tuesdays and Saturdays.   Call Coumadin Clinic for any changes in medications or upcoming procedures. Recheck INR in 4 weeks. [facility] Clinic [phone].

## 2021-02-20 ENCOUNTER — Other Ambulatory Visit: Payer: Self-pay | Admitting: *Deleted

## 2021-02-20 MED ORDER — GABAPENTIN 100 MG PO CAPS: 100.0000 mg | ORAL_CAPSULE | Freq: Two times a day (BID) | ORAL | 0 refills | Status: DC

## 2021-02-20 NOTE — Telephone Encounter (Signed)
Patient requests refill of  Gabapentin 100 mg Last filled by Dr Maren Beach

## 2021-03-16 ENCOUNTER — Other Ambulatory Visit: Payer: Self-pay | Admitting: Internal Medicine

## 2021-03-16 ENCOUNTER — Other Ambulatory Visit: Payer: Self-pay | Admitting: Interventional Cardiology

## 2021-03-16 DIAGNOSIS — I4821 Permanent atrial fibrillation: Secondary | ICD-10-CM

## 2021-03-16 NOTE — Telephone Encounter (Addendum)
Prescription refill request received for warfarin [place]: 10/17/19 [facility])  Next INR check: 04/21/21 Warfarin tablet strength: 5mg    Pt overdue for office visit, pt can schedule appt  at next INR check on 04/21/21.    Appropriate dose and refill sent to requested pharmacy.

## 2021-03-19 ENCOUNTER — Ambulatory Visit (INDEPENDENT_AMBULATORY_CARE_PROVIDER_SITE_OTHER): Payer: Medicare Other

## 2021-03-19 ENCOUNTER — Other Ambulatory Visit: Payer: Self-pay

## 2021-03-19 DIAGNOSIS — I4821 Permanent atrial fibrillation: Secondary | ICD-10-CM | POA: Diagnosis not present

## 2021-03-19 DIAGNOSIS — Z5181 Encounter for therapeutic drug level monitoring: Secondary | ICD-10-CM

## 2021-03-19 LAB — POCT INR: INR: 2.7 (ref 2.0–3.0)

## 2021-03-19 NOTE — Patient Instructions (Signed)
Description   Continue taking Warfarin 1 tablet daily except for 1/2 tablet on Tuesdays and Saturdays.   Call Coumadin Clinic for any changes in medications or upcoming procedures. Recheck INR in 5 weeks. [facility] [phone].

## 2021-03-20 ENCOUNTER — Ambulatory Visit: Payer: Medicare Other | Admitting: Interventional Cardiology

## 2021-04-06 ENCOUNTER — Telehealth: Payer: Self-pay | Admitting: Interventional Cardiology

## 2021-04-06 DIAGNOSIS — I4821 Permanent atrial fibrillation: Secondary | ICD-10-CM

## 2021-04-06 NOTE — Telephone Encounter (Signed)
*  STAT* If patient is at the pharmacy, call can be transferred to refill team.   1. Which medications need to be refilled? (please list name of each medication and dose if known)  warfarin (COUMADIN) 5 MG tablet  2. Which pharmacy/location (including street and city if local pharmacy) is medication to be sent to? [facility] [place], [place] - [address]  3. Do they need a 30 day or 90 day supply?  90 day supply

## 2021-04-07 MED ORDER — WARFARIN SODIUM 5 MG PO TABS: ORAL_TABLET | ORAL | 1 refills | Status: DC

## 2021-04-27 ENCOUNTER — Telehealth: Payer: Self-pay | Admitting: *Deleted

## 2021-04-27 NOTE — Telephone Encounter (Signed)
Called pt since she is overdue for Anticoagulation visit; pt states her PCP is trying to figure out what is going on with her. She has an appt with them on 04/29/2021 advised once she has that appt to call me back so we can get her scheduled. She states they are going to check all her labs so asked if they checked her INR to let us know so we can take care of it. Will await & follow up.

## 2021-04-28 NOTE — Progress Notes (Signed)
ACUTE VISIT Chief Complaint  Patient presents with   Numbness    Patient states after flu shot her body has been feeling numb, especially in her hands. Hands have also been extremely shakey and reports her right arm is hard to raise up. When she wakes up she feels a "shake" in her chest. She is also experiencing diarrhea.    HPI: [name] is a 87 y.o. female with hx of B12 def, HTN,thyroid disease, atrial fib on chronic anticoagulation,and tremor here today with several complaint: Right shoulder pain, "shaking",knee pain,bruising,N/V,diarrhea,and numbness. It is difficult to obtain hx because she is reporting several symptoms at the time, hard to obtain details for one in particular. Her son is outside and she does not want to bring him in during visit.  Some symptoms she is ascribing to her flu vaccine. Right shoulder pain "for a while." Reporting having cervical MRI done,thought to be a "pinched nerve." Pain radiated lateral aspect of shoulder to elbow. No hx of trauma. It is severe, constant. Limitation of ROM. Exacerbated by movement and alleviated by rest. She has not tried OTC analgesic. She has received influenza vaccine in the left arm.  Right handed. Cervical MRI done on 01/14/2021: Multilevel disc degeneration and spurring in the cervical spine.  Spinal and foraminal stenosis.  Negative for acute fracture or mass.  She also mentions bilateral knee pain, she follows with orthopedics and has had intra-articular steroid injections.  She states that last treatment 3 weeks ago did not help.  "Numb all over", which has also been going on "for a while." No associated weakness. Problem is constant. She has not identified exacerbating or alleviating factors. Noted on medication list gabapentin 100 mg, she is not taking medication and she is not sure if she tried it. Negative for his skin rash.  Evaluated in the ED on 01/15/21 because paresthesia, vision changes,and  weakness. Brain MRI on 01/16/21: 1. No acute intracranial abnormality. 2. Generalized volume loss and findings of chronic small vessel ischemia. Lab Results  Component Value Date   [id number] 1,517 (H) 01/13/2021   "Shaking in my chest" and UE's for about 1-2 years, usually it is worse in the morning. No associated CP, worsening dyspnea, or palpitations. States that she has had SOM "for a while." She follows with cardiologist regularly.  She wonders if she needs to continue the same dose of levothyroxine, currently she is on 88 mcg daily.  Lab Results  Component Value Date   TSH 1.305 01/13/2021   Lab Results  Component Value Date   WBC 7.7 01/15/2021   HGB 13.9 01/15/2021   HCT 39.3 01/15/2021   MCV 93.3 01/15/2021   PLT 199 01/15/2021   Lab Results  Component Value Date   CREATININE 1.33 (H) 01/15/2021   BUN 20 01/15/2021   NA 138 01/15/2021   K 4.2 01/15/2021   CL 107 01/15/2021   CO2 20 (L) 01/15/2021   Reporting loose stools since the beginning of October 2022. She has not identified exacerbating or alleviating factors. Today she has had 1-2 stools. "Little" abdomina cramp, "gas" pain right before defecation and resolves after. Negative for sick contact, recent travel, or antibiotic use in the past few months.  Nausea and vomiting that resolved 4 days ago.  Apparently she had nausea and vomiting around Thanksgiving last year and has been intermittent since then. No associated fever, chills, or decreased appetite. She has not tried OTC medications.  She also would like to have her  urine checked today. She denies dysuria or decreased urine output. + Urinary frequency, attributed to increased in fluid intake since diarrhea started.  Lower extremity bruising and edema that have been going on "for a while." She is concerned about ecchymosis above left breast last week, it has resolved. She denies nose/gum bleeding, melena, blood in the stool, or gross  hematuria.  She would like her INR checked today. Atrial fibrillation on Coumadin 5 mg 1/2 tablet Sundays and Tuesdays, rest of the week she takes 5 mg.  Review of Systems  Constitutional:  Positive for fatigue. Negative for activity change and appetite change.  HENT:  Negative for facial swelling and sore throat.   Respiratory:  Negative for cough and wheezing.   Endocrine: Negative for cold intolerance and heat intolerance.  Musculoskeletal:  Positive for arthralgias and gait problem.  Neurological:  Negative for syncope and facial asymmetry.  Psychiatric/Behavioral:  Negative for confusion and hallucinations. The patient is nervous/anxious.   Rest see pertinent positives and negatives per HPI.  Current Outpatient Medications on File Prior to Visit  Medication Sig Dispense Refill   acetaminophen (TYLENOL) 650 MG CR tablet Take 650 mg by mouth 2 (two) times daily.     levothyroxine (SYNTHROID) 88 MCG tablet TAKE ONE TABLET EACH DAY BEFORE BREAKFAST 90 tablet 1   metoprolol tartrate (LOPRESSOR) 50 MG tablet TAKE ONE TABLET TWICE DAILY WITH A MEAL (Patient taking differently: Take 50 mg by mouth 2 (two) times daily with a meal.) 180 tablet 1   vitamin B-12 (CYANOCOBALAMIN) 1000 MCG tablet Take 5,000 mcg by mouth See admin instructions. Take 5,000 mcg by mouth one to two times a day     warfarin (COUMADIN) 5 MG tablet Take as directed by Anticoagulation Clinic. 30 tablet 1   pantoprazole (PROTONIX) 40 MG tablet Take 1 tablet (40 mg total) by mouth daily for 14 days. (Patient not taking: Reported on 01/20/2021) 14 tablet 0   No current facility-administered medications on file prior to visit.   Past Medical History:  Diagnosis Date   Anemia    history of   Anxiety    Arthritis    "left knee" (04/05/2017)   Atrial fibrillation (HCC)    Atrial flutter ([place])    typical appearing   Atrial tachycardia ([place])    ablated 11/17/10  by JA  from the [facility] of the aorta   CHF (congestive heart  failure) (HCC)    Chronic lower back pain    Chronic nausea    Endometrial cancer (HCC)    grade 1   Gallstone pancreatitis    Gastritis    GERD (gastroesophageal reflux disease)    History of hiatal hernia    HTN (hypertension)    Hyperlipemia    Hypothyroidism    Obesity    Osteoporosis    Toe ulcer ([place])    left 3rd toe   Vitamin D deficiency    Allergies  Allergen Reactions   Iodinated Diagnostic Agents Shortness Of Breath and Other (See Comments)    "Allergic," per [facility]- Causes headaches, also   Zosyn [Piperacillin Sod-Tazobactam So] Other (See Comments)    "Allergic," per [facility]   Penicillins Other (See Comments)    Tolerated Zosyn Oct 2018, but "Allergic," per [facility] Did it involve swelling of the face/tongue/throat, SOB, or low BP? Unk Did it involve sudden or severe rash/hives, skin peeling, or any reaction on the inside of your mouth or nose? Unk Did you need to seek medical attention at  a hospital or doctor's office? Unk When did it last happen? Unk If all above answers are "NO", may proceed with cephalosporin use.    Social History   Socioeconomic History   Marital status: Widowed    Spouse name: Not on file   Number of children: 2   Years of education: 28   Highest education level: Not on file  Occupational History   Not on file  Tobacco Use   Smoking status: Never   Smokeless tobacco: Never  Vaping Use   Vaping Use: Never used  Substance and Sexual Activity   Alcohol use: No   Drug use: No   Sexual activity: Never    Birth control/protection: Post-menopausal  Other Topics Concern   Not on file  Social History Narrative   Lives alone in a one story home.  Has 2 children.  Retired.  Education: high school.     Social Determinants of Health   Financial Resource Strain: Not on file  Food Insecurity: Not on file  Transportation Needs: Not on file  Physical Activity: Not on file  Stress: Not on file  Social Connections: Not on file    Vitals:    04/29/21 0831  BP: 130/88  Pulse: 82  Resp: 16  Temp: 97.7 F (36.5 C)  SpO2: 99%   Body mass index is 29.09 kg/m.  Physical Exam Vitals and nursing note reviewed.  Constitutional:      General: She is not in acute distress.    Appearance: She is well-developed.  HENT:     Head: Normocephalic and atraumatic.     Mouth/Throat:     Mouth: Mucous membranes are moist.     Pharynx: Oropharynx is clear.     Comments: Edentulous. Eyes:     Conjunctiva/sclera: Conjunctivae normal.  Cardiovascular:     Rate and Rhythm: Normal rate and regular rhythm.     Heart sounds: No murmur heard.    Comments: PT pulses present. Pulmonary:     Effort: Pulmonary effort is normal. No respiratory distress.     Breath sounds: Normal breath sounds.  Abdominal:     Palpations: Abdomen is soft.     Tenderness: There is no abdominal tenderness.  Musculoskeletal:     Right shoulder: Tenderness present. No bony tenderness. Decreased range of motion. Normal strength.     Cervical back: Tenderness present. No bony tenderness. Pain with movement present. Decreased range of motion.       Back:     Right lower leg: No edema.     Left lower leg: No edema.     Comments: Mark limitation of ROM. Guarded,could not perform maneuvers. Muscle atrophy.  Skin:    General: Skin is warm.     Findings: No ecchymosis, erythema or rash.  Neurological:     General: No focal deficit present.     Mental Status: She is alert.     Cranial Nerves: No cranial nerve deficit.     Motor: Tremor (Head and hands,seems exacerbated by anxiety.) present.     Comments: Unstable, antalgic gait assisted with a cane. Difficulty getting up from chair without help. Strength 4/5 RUE, mainly due to pain. Rest no focal deficit appreciated. She is oriented x 3.  Psychiatric:        Mood and Affect: Mood is anxious. Affect is labile.   ASSESSMENT AND PLAN:  Ms.Vidhi was seen today for numbness.  Diagnoses and all orders for this  visit: Orders Placed This Encounter  Procedures   Culture, Urine   CBC   Basic metabolic panel   TSH   Protime-INR   Urinalysis with Reflex Microscopic   C-reactive protein   Ambulatory referral to Sports Medicine   Lab Results  Component Value Date   CREATININE 0.98 04/29/2021   BUN 25 (H) 04/29/2021   NA 140 04/29/2021   K 4.4 04/29/2021   CL 105 04/29/2021   CO2 23 04/29/2021   Lab Results  Component Value Date   TSH 1.81 04/29/2021   Lab Results  Component Value Date   WBC 8.3 04/29/2021   HGB 15.7 (H) 04/29/2021   HCT 46.1 (H) 04/29/2021   MCV 95.6 04/29/2021   PLT 227.0 04/29/2021   Lab Results  Component Value Date   CRP <1.0 04/29/2021   Right shoulder pain, unspecified chronicity Problem seems to be chronic and getting worse. Negative for history of trauma, so I did not recommend imaging today. She is not interested in PT and initially she was reluctant to be referred to sport medicine, finally she agrees with the latter one.  -     diclofenac Sodium (VOLTAREN) 1 % GEL; Apply 4 g topically 4 (four) times daily.  Chronic anticoagulation Atrial fibrillation on Coumadin. We do not have coumadin clinic today, so it needs to be added to labs. Continue Coumadin 5 mg 1/2 tablet Sundays and Tuesday and 1 tablet the rest of the week. Further recommendation will be given according to INR result.  Diarrhea, unspecified type We discussed possible etiologies. She is not interested in collecting stool sample. She would like medication to help with problem,recommend OTC Imodium once daily as needed. Upset because recommended adequate hydration, states that she is tired of drinking fluids. Gatorade and water 1:1 small sips thought the day. Instructed about warning signs. Further recommendations according to lab results. Follow-up with PCP in 10 days, before if needed.  Urinary frequency History and urine dipstick do not suggest UTI. We discussed some side effects  of NSAIDs, including worsening diarrhea. We will follow urine culture. Clearly instructed about warning signs.  Paresthesia This seems to be a chronic problem. Recommend resuming Gabapentin 100 mg bid. We discussed some side effects. Fall precautions. May need neuro evaluation. Instructed about warning signs.  -     gabapentin (NEURONTIN) 100 MG capsule; Take 1 capsule (100 mg total) by mouth 2 (two) times daily.  Chronic pain of both knees Most likely OA. Intra-articular steroid injection no longer helping. We discussed diagnosis, prognosis, and treatment options. Topical Voltaren recommended. Continue following with orthopedics.  Tremor Reporting it as "shaking "sensation. Chronic. She has been evaluated by neurologist, felt to be caused by some of her medications and question of multiple systemic atrophy.  Hypothyroidism, unspecified type Continue levothyroxine 88 mcg daily. Further recommendation will be given according to TSH result.  She authorized [place] to call her son to help her get up from the chair, spoke with him and discussed plan.  I spent a total of 64 minutes in both face to face and non face to face activities for this visit on the date of this encounter. During this time history was obtained and documented, examination was performed, prior labs/imaging reviewed, and assessment/plan discussed.  Return in about 10 days (around 05/09/2021) for diarreha with PCP.  Brodyn Depuy G. [place], MD  [facility]. [place] office.

## 2021-04-29 ENCOUNTER — Ambulatory Visit (INDEPENDENT_AMBULATORY_CARE_PROVIDER_SITE_OTHER): Payer: Medicare Other | Admitting: Family Medicine

## 2021-04-29 ENCOUNTER — Encounter: Payer: Self-pay | Admitting: Family Medicine

## 2021-04-29 VITALS — BP 130/88 | HR 82 | Temp 97.7°F | Resp 16 | Ht 69.0 in | Wt 197.0 lb

## 2021-04-29 DIAGNOSIS — R350 Frequency of micturition: Secondary | ICD-10-CM

## 2021-04-29 DIAGNOSIS — M25561 Pain in right knee: Secondary | ICD-10-CM

## 2021-04-29 DIAGNOSIS — R251 Tremor, unspecified: Secondary | ICD-10-CM

## 2021-04-29 DIAGNOSIS — M25562 Pain in left knee: Secondary | ICD-10-CM

## 2021-04-29 DIAGNOSIS — Z7901 Long term (current) use of anticoagulants: Secondary | ICD-10-CM

## 2021-04-29 DIAGNOSIS — G8929 Other chronic pain: Secondary | ICD-10-CM

## 2021-04-29 DIAGNOSIS — R202 Paresthesia of skin: Secondary | ICD-10-CM

## 2021-04-29 DIAGNOSIS — E039 Hypothyroidism, unspecified: Secondary | ICD-10-CM | POA: Diagnosis not present

## 2021-04-29 DIAGNOSIS — R197 Diarrhea, unspecified: Secondary | ICD-10-CM

## 2021-04-29 DIAGNOSIS — M25511 Pain in right shoulder: Secondary | ICD-10-CM | POA: Diagnosis not present

## 2021-04-29 LAB — URINALYSIS, ROUTINE W REFLEX MICROSCOPIC
Bilirubin Urine: NEGATIVE
Hgb urine dipstick: NEGATIVE
Ketones, ur: NEGATIVE
Leukocytes,Ua: NEGATIVE
Nitrite: NEGATIVE
RBC / HPF: NONE SEEN (ref 0–?)
Specific Gravity, Urine: >=1.03 — AB (ref 1.000–1.030)
Total Protein, Urine: NEGATIVE
Urine Glucose: NEGATIVE
Urobilinogen, UA: 0.2 (ref 0.0–1.0)
pH: 5.5 (ref 5.0–8.0)

## 2021-04-29 LAB — BASIC METABOLIC PANEL
BUN: 25 mg/dL — ABNORMAL HIGH (ref 6–23)
CO2: 23 mEq/L (ref 19–32)
Calcium: 10.4 mg/dL (ref 8.4–10.5)
Chloride: 105 mEq/L (ref 96–112)
Creatinine, Ser: 0.98 mg/dL (ref 0.40–1.20)
GFR: 52.33 mL/min — ABNORMAL LOW (ref >60.00)
Glucose, Bld: 113 mg/dL — ABNORMAL HIGH (ref 70–99)
Potassium: 4.4 mEq/L (ref 3.5–5.1)
Sodium: 140 mEq/L (ref 135–145)

## 2021-04-29 LAB — CBC
HCT: 46.1 % — ABNORMAL HIGH (ref 36.0–46.0)
Hemoglobin: 15.7 g/dL — ABNORMAL HIGH (ref 12.0–15.0)
MCHC: 34 g/dL (ref 30.0–36.0)
MCV: 95.6 fl (ref 78.0–100.0)
Platelets: 227 10*3/uL (ref 150.0–400.0)
RBC: 4.82 Mil/uL (ref 3.87–5.11)
RDW: 13.2 % (ref 11.5–15.5)
WBC: 8.3 10*3/uL (ref 4.0–10.5)

## 2021-04-29 LAB — C-REACTIVE PROTEIN: CRP: <1 mg/dL (ref 0.5–20.0)

## 2021-04-29 LAB — TSH: TSH: 1.81 u[IU]/mL (ref 0.35–5.50)

## 2021-04-29 MED ORDER — DICLOFENAC SODIUM 1 % EX GEL: 4.0000 g | Freq: Four times a day (QID) | CUTANEOUS | 1 refills | Status: DC

## 2021-04-29 MED ORDER — GABAPENTIN 100 MG PO CAPS: 100.0000 mg | ORAL_CAPSULE | Freq: Two times a day (BID) | ORAL | 0 refills | Status: DC

## 2021-04-29 NOTE — Patient Instructions (Addendum)
A few things to remember from today's visit:  Right shoulder pain, unspecified chronicity - Plan: diclofenac Sodium (VOLTAREN) 1 % GEL  Chronic anticoagulation - Plan: Protime-INR  Diarrhea, unspecified type - Plan: CBC, Basic metabolic panel, TSH  Do not use My Chart to request refills or for acute issues that need immediate attention.   Resume Gabapentin 100 mg 2 times daily , this may help with numbness. Caution with falls. We need to decide about PT or sport medicine evaluation for right shoulder pain. Knee pain is chronic, osteoarthritis and no may options for pain management due to your medical conditions. You can apply Voltaren gel around knee and shoulder 3-4 times per day.  Imodium over the counter once daily may help with diarrhea but main treatment is hydration, you can mix Gatorade and water 1/2 and 1/2 and drink small sips through the day.  Please be sure medication list is accurate. If a new problem present, please set up appointment sooner than planned today. No changes in rest of medications. If symptoms get suddenly worse or you develop fever you need to go to the ER.

## 2021-04-30 ENCOUNTER — Telehealth: Payer: Self-pay

## 2021-04-30 ENCOUNTER — Telehealth: Payer: Self-pay | Admitting: *Deleted

## 2021-04-30 LAB — URINE CULTURE
MICRO NUMBER:: 12645425
SPECIMEN QUALITY:: ADEQUATE

## 2021-04-30 NOTE — Telephone Encounter (Signed)
Patient called asking for results patient was informed of results and verbalized understanding

## 2021-04-30 NOTE — Telephone Encounter (Addendum)
Pt called and stated she had her INR lab drawn at her PCP on yesterday. She stated they have it and I could call them, looked at the results tab and noted the lab has a collection time of 946am as collected along with other lab results, then at 1514 yesterday, there is a future INR lab.  Called pt's PCP office regarding the pt's INR labs that were drawn yesterday. Spoke with Larene Beach the Anticoagulation Nurse and gave her the information to assist me and she stated she would ask the Supervisor at [facility] to take a look at this since the INR is still pending and get back with me. Left my call back number for follow up. Will await.   At 337pm, received a call back from the pt's PCP office and the person that obtained the labs did not get collect enough blood to fill the tube. She states they have contacted the pt and the pt will be go to one of the satellite lab offices (off [place]) to have her INR redrawn. She states the pt did not state which day she will go so we will monitor to see if she goes soon. If not we will need to call pt back and schedule an appointment to avoid prolonged monitoring.

## 2021-05-05 NOTE — Telephone Encounter (Signed)
Called spoke with pt, pt states she cannot come into our office for an INR check secondary to arm numbness and difficulty moving.  Advised pt her INR is overdue to be checked and has not been checked since 03/19/21. Pt states PCP checked it when she was at their office, but they did not get enough blood at that time and needed pt to come back to office for redraw.  Pt has an upcoming appt on 05/14/21 with her PCP for follow-up.  Pt states she will have INR checked at that time.  Pt refused sooner follow-up appt for INR check in our Coumadin Clinic.  Advised pt she is putting herself at risk to clot and stroke or bleed secondary to non compliance with INR follow-up.  Advised pt to go to the ED with bleeding or stroke symptoms. Will await INR results fro check on 05/14/21.

## 2021-05-14 ENCOUNTER — Encounter: Payer: Self-pay | Admitting: Internal Medicine

## 2021-05-14 ENCOUNTER — Ambulatory Visit (INDEPENDENT_AMBULATORY_CARE_PROVIDER_SITE_OTHER): Payer: Medicare Other

## 2021-05-14 ENCOUNTER — Other Ambulatory Visit: Payer: Self-pay

## 2021-05-14 ENCOUNTER — Ambulatory Visit (INDEPENDENT_AMBULATORY_CARE_PROVIDER_SITE_OTHER): Payer: Medicare Other | Admitting: Internal Medicine

## 2021-05-14 ENCOUNTER — Ambulatory Visit (INDEPENDENT_AMBULATORY_CARE_PROVIDER_SITE_OTHER): Payer: Medicare Other | Admitting: *Deleted

## 2021-05-14 VITALS — BP 140/90 | HR 100 | Wt 204.2 lb

## 2021-05-14 DIAGNOSIS — I4821 Permanent atrial fibrillation: Secondary | ICD-10-CM

## 2021-05-14 DIAGNOSIS — M25511 Pain in right shoulder: Secondary | ICD-10-CM

## 2021-05-14 DIAGNOSIS — Z7901 Long term (current) use of anticoagulants: Secondary | ICD-10-CM | POA: Diagnosis not present

## 2021-05-14 DIAGNOSIS — Z5181 Encounter for therapeutic drug level monitoring: Secondary | ICD-10-CM

## 2021-05-14 LAB — POCT INR: INR: 4.3 — AB (ref 2.0–3.0)

## 2021-05-14 IMAGING — DX DG SHOULDER 2+V*R*
2 series · 2 of 2 positions shown · non-contrast
Comparison: [DATE]

CLINICAL DATA: Pain x3 weeks

EXAM:
RIGHT SHOULDER - 2+ VIEW

[shoulder internal rotation ap]
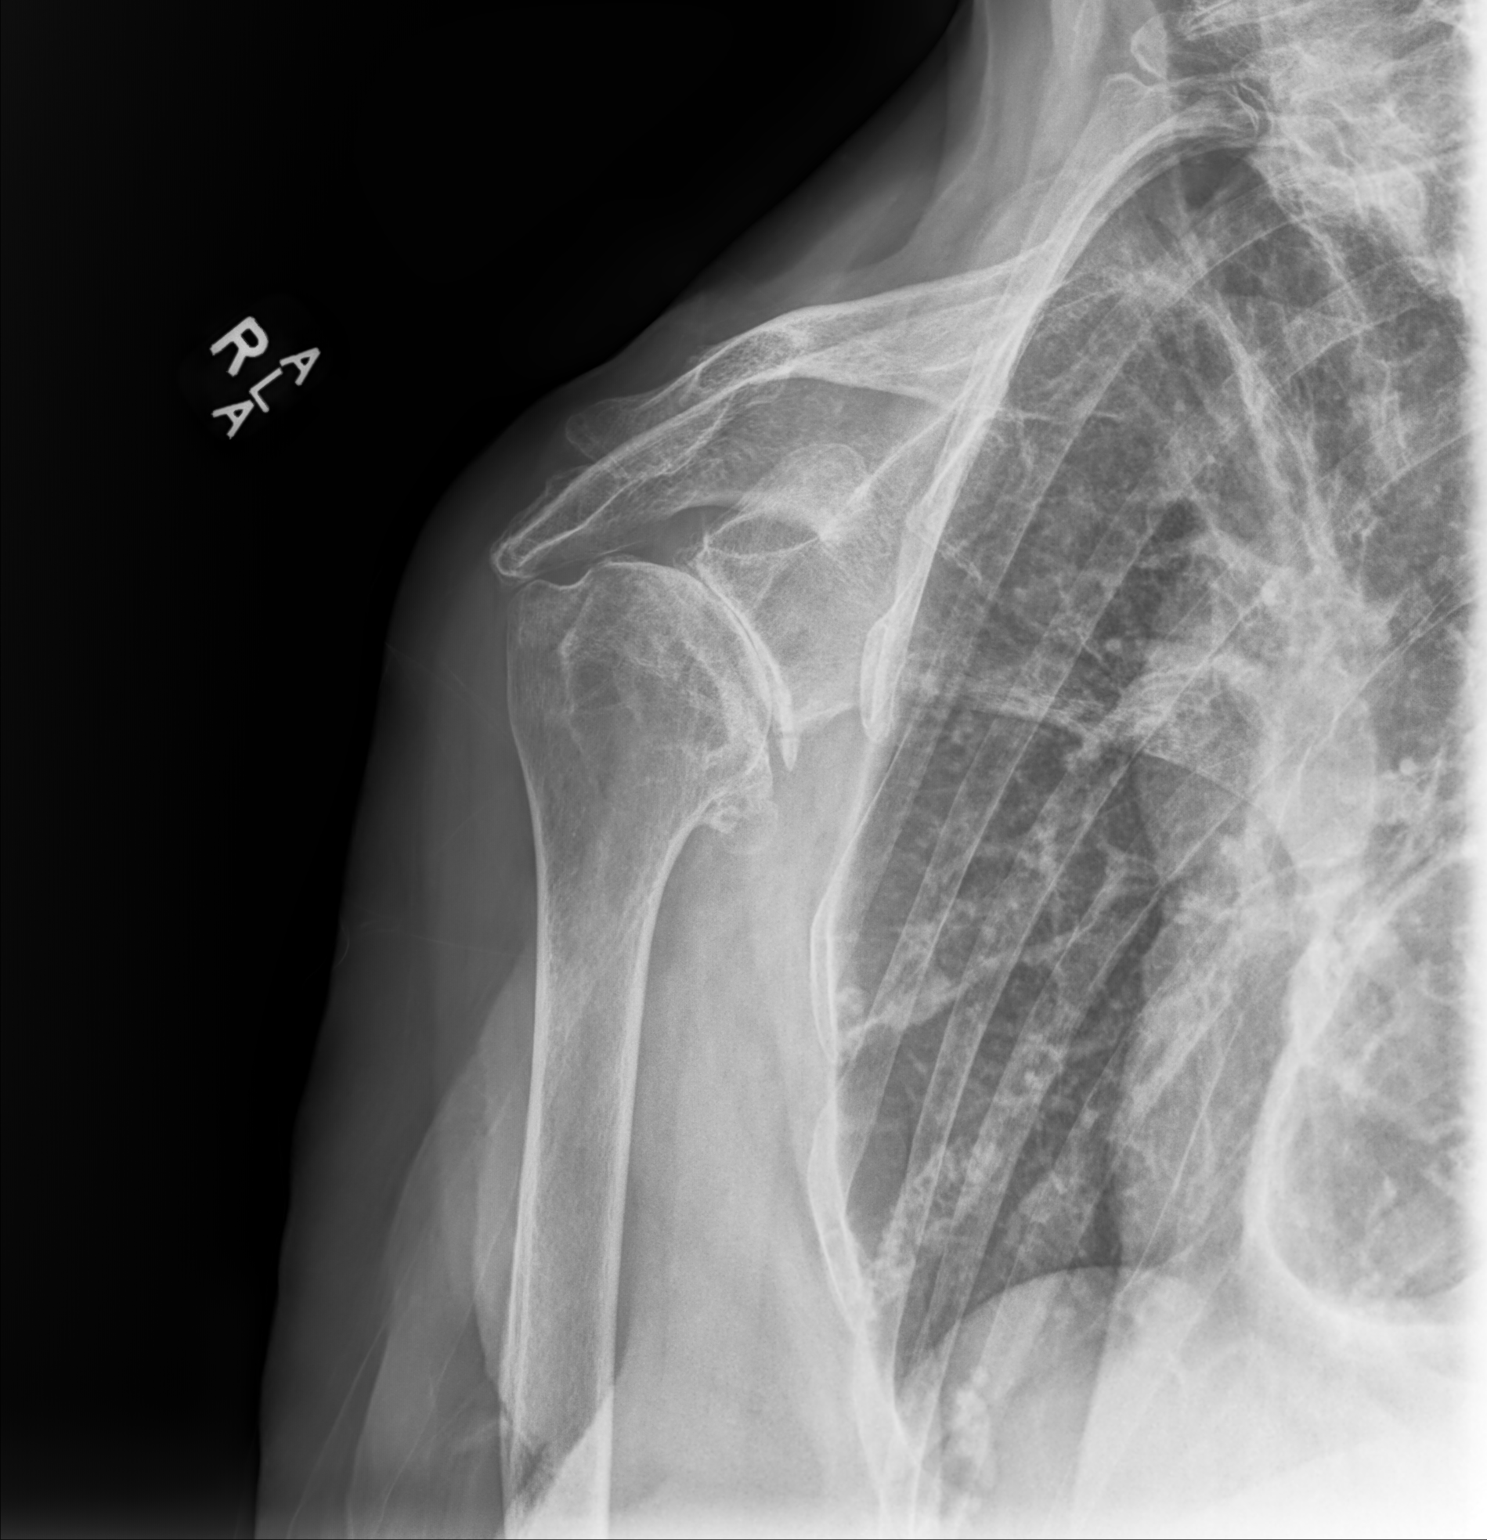

[shoulder (y view)]
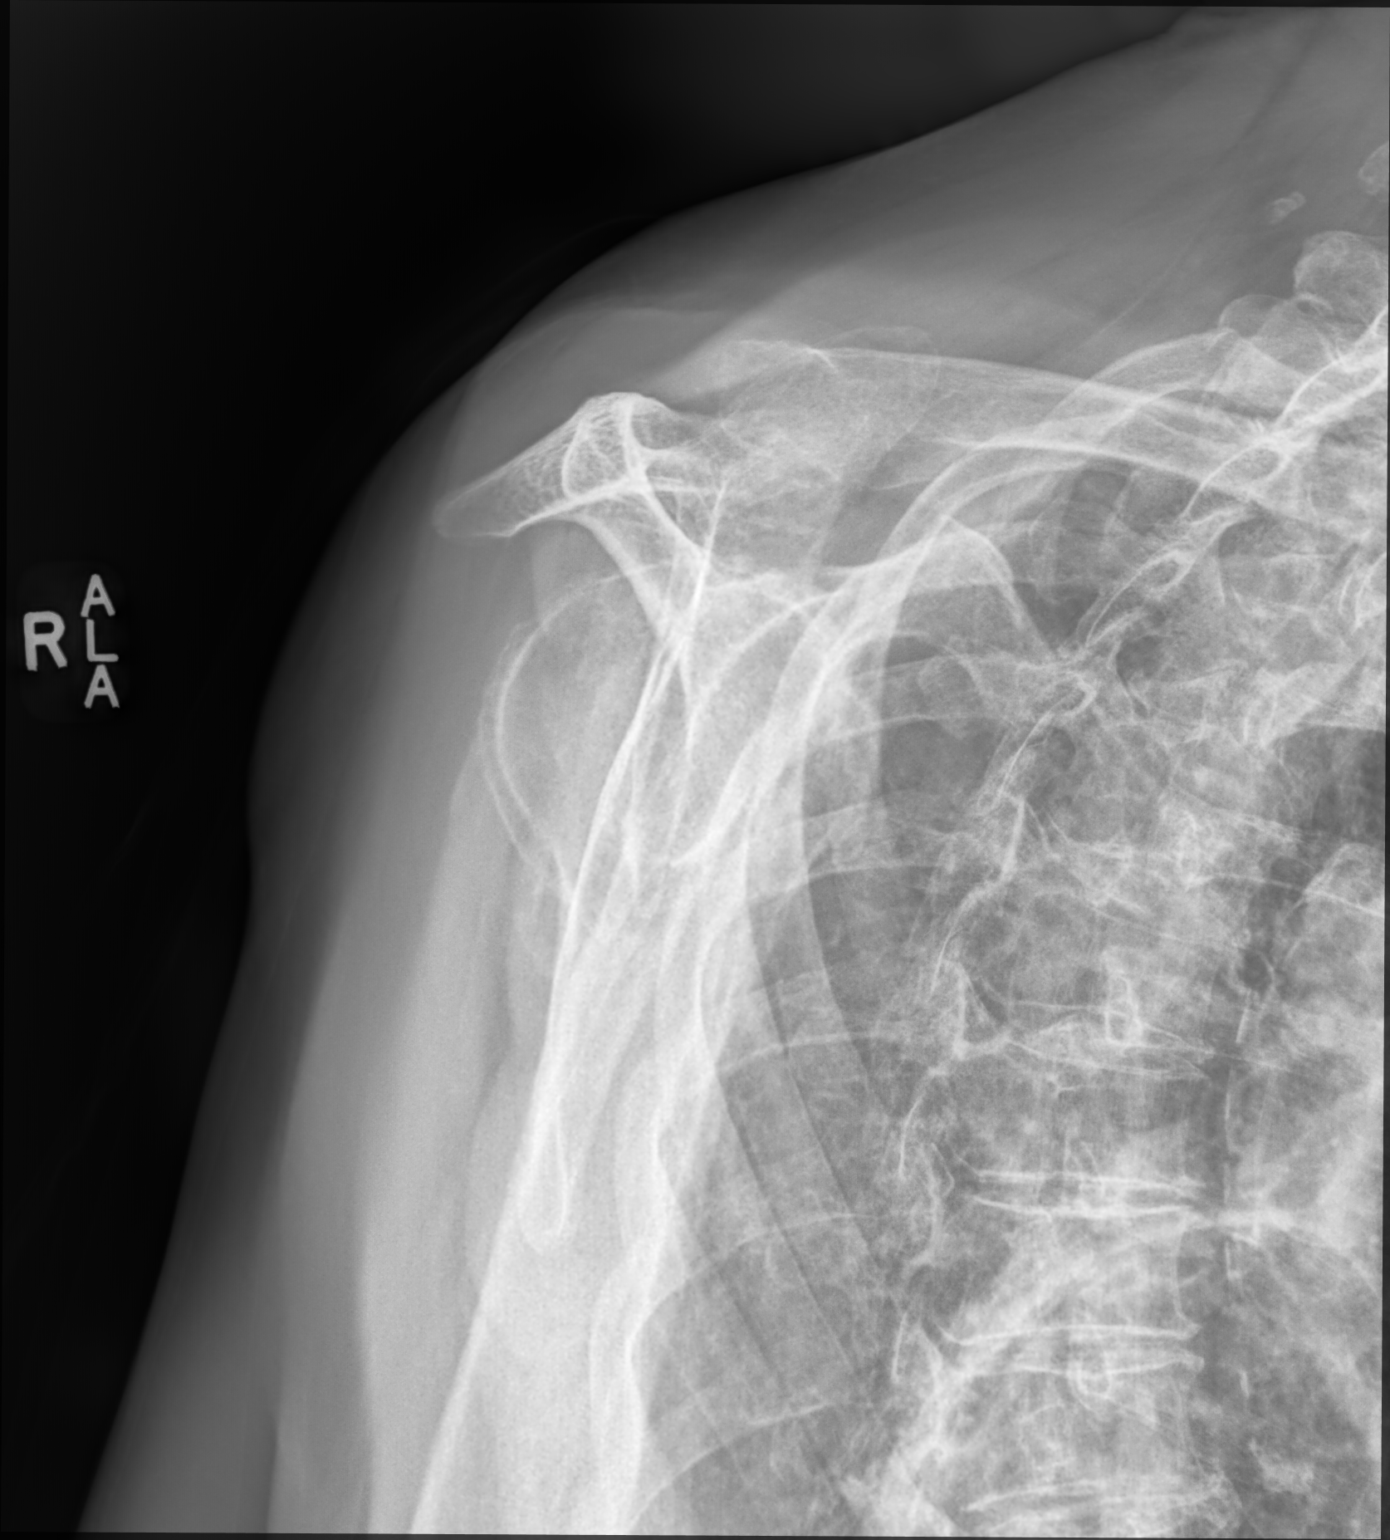

[2 of 2 positions shown; findings below may reference images not displayed]

FINDINGS: Advanced DJD in the glenohumeral joint with remodeling of the
humeral head, progressive since prior study. Probable osteochondral
free body in the inferior aspect of the shoulder joint. No fracture
or dislocation. Large hiatal hernia partially visualized.
IMPRESSION: 1. Advanced progressive glenohumeral DJD without fracture or other
acute finding.

## 2021-05-14 NOTE — Progress Notes (Signed)
Acute office Visit     This visit occurred during the SARS-CoV-2 public health emergency.  Safety protocols were in place, including screening questions prior to the visit, additional usage of staff PPE, and extensive cleaning of exam room while observing appropriate contact time as indicated for disinfecting solutions.    CC/Reason for Visit: Right shoulder pain  HPI: Charlotte Castro is a 87 y.o. female who is coming in today for the above mentioned reasons.  She has been experiencing right shoulder pain for about 3 weeks.  She believes it is related to her flu shot that she got at the end of October.  She states pain is around her right shoulder area, will migrate down into her upper arm and causes tremors of her right hand.  She is unable to abduct beyond 90 degrees.  No injury that she can recall.  Past Medical/Surgical History: Past Medical History:  Diagnosis Date   Anemia    history of   Anxiety    Arthritis    "left knee" (04/05/2017)   Atrial fibrillation (HCC)    Atrial flutter ([place])    typical appearing   Atrial tachycardia ([place])    ablated 11/17/10  by JA  from the [facility] of the aorta   CHF (congestive heart failure) (HCC)    Chronic lower back pain    Chronic nausea    Endometrial cancer (HCC)    grade 1   Gallstone pancreatitis    Gastritis    GERD (gastroesophageal reflux disease)    History of hiatal hernia    HTN (hypertension)    Hyperlipemia    Hypothyroidism    Obesity    Osteoporosis    Toe ulcer ([place])    left 3rd toe   Vitamin D deficiency     Past Surgical History:  Procedure Laterality Date   ATRIAL ABLATION SURGERY  11/17/10   Atrial tachycardia arising from [facility] of the aorta ablated by [place]  01/11/2011   Archie Endo 01/12/2011   CHOLECYSTECTOMY N/A 04/08/2017   Procedure: LAPAROSCOPIC CHOLECYSTECTOMY;  Surgeon: Georganna Skeans, MD;  Location: [facility];  Service: General;  Laterality: N/A;   FRACTURE SURGERY      HYSTEROSCOPY WITH D & C N/A 08/05/2017   Procedure: DILATATION AND CURETTAGE /HYSTEROSCOPY AND POLYPECTOMY;  Surgeon: Osborne Oman, MD;  Location: [facility];  Service: Gynecology;  Laterality: N/A;   LUMBAR SPINE SURGERY  ~ [place] SALPINGO OOPHERECTOMY Bilateral 09/20/2017   Procedure: XI ROBOTIC ASSISTED TOTAL HYSTERECTOMY WITH BILATERAL SALPINGO OOPHORECTOMY, SENTINAL LYMPH NODE BIOPSY;  Surgeon: Everitt Amber, MD;  Location: [facility];  Service: Gynecology;  Laterality: Bilateral;   SHOULDER SURGERY Right 1984 X2   /ntoes 01/19/2011   WRIST FRACTURE SURGERY Left 1983    Social History:  reports that she has never smoked. She has never used smokeless tobacco. She reports that she does not drink alcohol and does not use drugs.  Allergies: Allergies  Allergen Reactions   Iodinated Diagnostic Agents Shortness Of Breath and Other (See Comments)    "Allergic," per MAR- Causes headaches, also   Zosyn [Piperacillin Sod-Tazobactam So] Other (See Comments)    "Allergic," per [facility]   Penicillins Other (See Comments)    Tolerated Zosyn Oct 2018, but "Allergic," per [facility] Did it involve swelling of the face/tongue/throat, SOB, or low BP? Unk Did it involve sudden or severe rash/hives,  skin peeling, or any reaction on the inside of your mouth or nose? Unk Did you need to seek medical attention at a hospital or doctor's office? Unk When did it last happen? Unk If all above answers are "NO", may proceed with cephalosporin use.     Family History:  Family History  Problem Relation Age of Onset   Heart attack Father    Hypertension Father      Current Outpatient Medications:    acetaminophen (TYLENOL) 650 MG CR tablet, Take 650 mg by mouth 2 (two) times daily., Disp: , Rfl:    diclofenac Sodium (VOLTAREN) 1 % GEL, Apply 4 g topically 4 (four) times daily., Disp: 150 g, Rfl: 1   gabapentin (NEURONTIN) 100 MG capsule, Take 1 capsule (100 mg total) by  mouth 2 (two) times daily., Disp: 60 capsule, Rfl: 0   levothyroxine (SYNTHROID) 88 MCG tablet, TAKE ONE TABLET EACH DAY BEFORE BREAKFAST, Disp: 90 tablet, Rfl: 1   metoprolol tartrate (LOPRESSOR) 50 MG tablet, TAKE ONE TABLET TWICE DAILY WITH A MEAL (Patient taking differently: Take 50 mg by mouth 2 (two) times daily with a meal.), Disp: 180 tablet, Rfl: 1   vitamin B-12 (CYANOCOBALAMIN) 1000 MCG tablet, Take 5,000 mcg by mouth See admin instructions. Take 5,000 mcg by mouth one to two times a day, Disp: , Rfl:    warfarin (COUMADIN) 5 MG tablet, Take as directed by Anticoagulation Clinic., Disp: 30 tablet, Rfl: 1   pantoprazole (PROTONIX) 40 MG tablet, Take 1 tablet (40 mg total) by mouth daily for 14 days. (Patient not taking: Reported on 01/20/2021), Disp: 14 tablet, Rfl: 0  Review of Systems:  Constitutional: Denies fever, chills, diaphoresis, appetite change and fatigue.  HEENT: Denies photophobia, eye pain, redness, hearing loss, ear pain, congestion, sore throat, rhinorrhea, sneezing, mouth sores, trouble swallowing, neck pain, neck stiffness and tinnitus.   Respiratory: Denies SOB, DOE, cough, chest tightness,  and wheezing.   Cardiovascular: Denies chest pain, palpitations and leg swelling.  Gastrointestinal: Denies nausea, vomiting, abdominal pain, diarrhea, constipation, blood in stool and abdominal distention.  Genitourinary: Denies dysuria, urgency, frequency, hematuria, flank pain and difficulty urinating.  Endocrine: Denies: hot or cold intolerance, sweats, changes in hair or nails, polyuria, polydipsia. Musculoskeletal: Denies myalgias, back pain, joint swelling, and gait problem.  Skin: Denies pallor, rash and wound.  Neurological: Denies dizziness, seizures, syncope, weakness, light-headedness, numbness and headaches.  Hematological: Denies adenopathy. Easy bruising, personal or family bleeding history  Psychiatric/Behavioral: Denies suicidal ideation, mood changes, confusion,  nervousness, sleep disturbance and agitation    Physical Exam: Vitals:   05/14/21 0827  BP: 140/90  Pulse: 100  SpO2: 99%  Weight: 204 lb 3.2 oz (92.6 kg)    Body mass index is 30.16 kg/m.   Constitutional: NAD, calm, comfortable Eyes: PERRL, lids and conjunctivae normal ENMT: Mucous membranes are moist.   Musculoskeletal: No erythema or apparent joint effusion of the right shoulder, unable to abduct beyond 90 degrees, unable to do frontal raise beyond 90 degrees.  Tremors of her right hand are noted. Psychiatric: Normal judgment and insight. Alert and oriented x 3. Normal mood.    Impression and Plan:  Acute pain of right shoulder  - Plan: DG Shoulder Right, AMB referral to orthopedics  Chronic anticoagulation Permanent atrial fibrillation ([place])  - Plan: POC INR; will send results to our Coumadin nurse for further management.  Time spent: 21 minutes reviewing chart, interviewing and examining patient and formulating plan of care.  Jayron Maqueda Hernandez Acosta, MD [facility]   

## 2021-05-14 NOTE — Patient Instructions (Signed)
Description   Hold today's dose and tomorrow dose and then continue taking Warfarin 1 tablet daily except for 1/2 tablet on Tuesdays and Saturdays. Call Coumadin Clinic for any changes in medications or upcoming procedures. Recheck INR in 2 weeks.  Coumadin Clinic [phone]

## 2021-05-26 ENCOUNTER — Telehealth: Payer: Self-pay | Admitting: *Deleted

## 2021-05-26 ENCOUNTER — Ambulatory Visit: Payer: Medicare Other | Admitting: Orthopaedic Surgery

## 2021-05-26 NOTE — Telephone Encounter (Signed)
Called pt since she is missed her Anticoagulation Clinic Appt. She was due to be seen at 9am this morning; her son Marcello Moores answered (on [place]) and stated ma'am we already have that rescheduled for 06/03/2021. I then repeated where I was calling from again and that she missed her warfarin management check this morning. He stated she had her times mixed up on her appts so she missed the orthopedic one this morning & had to be rescheduled. Asked if he could get this one rescheduled and he stated next week ma'am before the other appt. 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 Advised that we would like to see her and work with schedules. He said ma'am I need Wednesday and then he started yelling again, so I waited for him to lower his voice and for his voice to stop since it was very loud. Advised that since he is speaking in a high tone yelling voice that he could call back once he is able to talk and we would be able to get the appointment rescheduled. While he was in his high tone, yelling voice, he stated "how about I call back next year" advised for him to call back when he is available to reschedule.

## 2021-06-03 ENCOUNTER — Ambulatory Visit (INDEPENDENT_AMBULATORY_CARE_PROVIDER_SITE_OTHER): Payer: Medicare Other | Admitting: Physician Assistant

## 2021-06-03 ENCOUNTER — Encounter: Payer: Self-pay | Admitting: Physician Assistant

## 2021-06-03 ENCOUNTER — Other Ambulatory Visit: Payer: Self-pay

## 2021-06-03 DIAGNOSIS — M4802 Spinal stenosis, cervical region: Secondary | ICD-10-CM

## 2021-06-03 DIAGNOSIS — M19011 Primary osteoarthritis, right shoulder: Secondary | ICD-10-CM

## 2021-06-03 MED ORDER — TRAMADOL HCL 50 MG PO TABS: 50.0000 mg | ORAL_TABLET | Freq: Four times a day (QID) | ORAL | 0 refills | Status: DC | PRN

## 2021-06-03 NOTE — Progress Notes (Signed)
Office Visit Note   Patient: Charlotte Castro           Date of Birth: 04-Mar-1933           MRN: [id number] Visit Date: 06/03/2021              Requested by: Charlotte Bliss, Charlotte Castro [place],  [place] [id number] PCP: Charlotte Bliss, Charlotte Castro   Assessment & Plan: Visit Diagnoses:  1. Primary osteoarthritis of right shoulder   2. Spinal stenosis of cervical region     Plan:  Discussed the findings and radiographs of the right shoulder and the findings of her prior MRI of her cervical spine.  Right shoulder films show end-stage arthritis right shoulder she is not interested in any type of surgical intervention there for would recommend intra-articular injection of the right shoulder under fluoroscopy.  In regards to her MRI of her cervical spine this showed degenerative disc disease throughout with moderate foraminal stenosis bilaterally at C5-C6.  And also moderate foraminal encroachment bilaterally at C4-C5.  Therefore recommend possible ESI and cervical spine versus physical therapy she is not interested in any type of therapy.  We will therefore work on setting up these injections for both her shoulder and her neck.  She will follow-up with [place] as needed.  Questions were encouraged and answered.  She was given tramadol for acute pain.  Follow-Up Instructions: Return if symptoms worsen or fail to improve.   Orders:  No orders of the defined types were placed in this encounter.  Meds ordered this encounter  Medications   traMADol (ULTRAM) 50 MG tablet    Sig: Take 1 tablet (50 mg total) by mouth every 6 (six) hours as needed.    Dispense:  30 tablet    Refill:  0      Procedures: No procedures performed   Clinical Data: No additional findings.   Subjective: Chief Complaint  Patient presents with   Right Shoulder - Pain    HPI Charlotte Castro comes in today with right shoulder pain and numbness tingling down her right right arm into her right  hand.  She states she got a flu shot in her left arm in October and that her pain started in her right arm after that.  She is tried naproxen but this makes her sleepy.  Her cardiologist is told her to take Tylenol for pain but this is not helping.  She has had no new injury.  She is nondiabetic.  She had an MRI of her cervical spine when she was hospitalized in the August was having some neck and head pain at that time that showed multiple level degenerative changes with most notably moderate foraminal stenosis at C5-C6 and moderate foraminal encroachment bilaterally at C4-C5.  She has permanent atrial fibrillation and is on chronic Coumadin.  Review of Systems  Constitutional:  Negative for chills and fever.  Musculoskeletal:  Positive for neck pain and neck stiffness.  Neurological:  Positive for numbness.    Objective: Vital Signs: There were no vitals taken for this visit.  Physical Exam Constitutional:      Appearance: She is not ill-appearing or diaphoretic.  Pulmonary:     Effort: Pulmonary effort is normal.  Neurological:     Mental Status: She is alert and oriented to person, place, and time.  Psychiatric:        Mood and Affect: Mood normal.    Ortho Exam Bilateral hands sensation  grossly intact motors intact.  She is down to 5 strength bilateral biceps and triceps.  Difficult to assess strength of her shoulders with external and internal rotation both shoulders against resistance reveals 5-5 strength.  She is unable to perform empty can test bilaterally due to shoulder pain.  Unable to perform liftoff test bilaterally due to pain.  She has significant crepitus with range of motion of both shoulders right is worse than the left.  Cervical spine decreased range of motion with any attempts at motion of the cervical spine.  She has tenderness in the right scapular medial border. Specialty Comments:  No specialty comments available.  Imaging: No results found.   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 ([place]) 08/04/2017   Postmenopausal bleeding 08/01/2017   Epigastric abdominal pain 04/05/2017   Delirium 04/14/2016   Tremor 04/14/2016   Chronic diastolic CHF (congestive heart failure) ([place]) 04/10/2016   Non-intractable vomiting 10/09/2015   Essential hypertension, benign 01/18/2014   Long term current use of anticoagulant therapy 01/18/2014   Permanent atrial fibrillation ([place]) 06/10/2011   Past Medical History:  Diagnosis Date   Anemia    history of   Anxiety    Arthritis    "left knee" (04/05/2017)   Atrial fibrillation (HCC)    Atrial flutter (HCC)    typical appearing   Atrial tachycardia ([place])    ablated 11/17/10  by JA  from the [facility] of the aorta   CHF (congestive heart failure) (HCC)    Chronic lower back pain    Chronic nausea    Endometrial cancer (HCC)    grade 1   Gallstone pancreatitis    Gastritis    GERD (gastroesophageal reflux disease)    History of hiatal hernia    HTN (hypertension)    Hyperlipemia    Hypothyroidism    Obesity    Osteoporosis    Toe ulcer ([place])    left 3rd toe   Vitamin D deficiency     Family History  Problem Relation Age of Onset   Heart attack Father    Hypertension Father     Past Surgical History:  Procedure Laterality Date   ATRIAL ABLATION SURGERY  11/17/10   Atrial tachycardia arising from [facility] of the aorta ablated by [place]  01/11/2011   Archie Endo 01/12/2011   CHOLECYSTECTOMY N/A 04/08/2017   Procedure: LAPAROSCOPIC  CHOLECYSTECTOMY;  Surgeon: Georganna Skeans, [place];  Location: [facility];  Service: General;  Laterality: N/A;   FRACTURE SURGERY     HYSTEROSCOPY WITH D & C N/A 08/05/2017   Procedure: DILATATION AND CURETTAGE /HYSTEROSCOPY AND POLYPECTOMY;  Surgeon: Osborne Oman, [place];  Location: [facility];  Service: Gynecology;  Laterality: N/A;   LUMBAR SPINE SURGERY  ~ [place] SALPINGO OOPHERECTOMY Bilateral 09/20/2017   Procedure: XI ROBOTIC ASSISTED TOTAL HYSTERECTOMY WITH BILATERAL SALPINGO OOPHORECTOMY, SENTINAL LYMPH NODE BIOPSY;  Surgeon: Everitt Amber, [place];  Location[facility];  Service: Gynecology;  Laterality: Bilateral;   SHOULDER SURGERY Right 1984 X2   /ntoes 01/19/2011   WRIST FRACTURE SURGERY Left 1983   Social History   Occupational History   Not on file  Tobacco Use   Smoking status: Never   Smokeless tobacco: Never  Vaping Use   Vaping Use: Never used  Substance and Sexual Activity   Alcohol use: No   Drug use: No   Sexual activity: Never    Birth control/protection: Post-menopausal

## 2021-06-04 NOTE — Addendum Note (Signed)
Addended by: Elvin So L on: 06/04/2021 11:15 AM   Modules accepted: Orders

## 2021-06-05 ENCOUNTER — Other Ambulatory Visit: Payer: Self-pay | Admitting: Family Medicine

## 2021-06-05 ENCOUNTER — Other Ambulatory Visit: Payer: Self-pay | Admitting: Interventional Cardiology

## 2021-06-05 DIAGNOSIS — I4821 Permanent atrial fibrillation: Secondary | ICD-10-CM

## 2021-06-05 DIAGNOSIS — I10 Essential (primary) hypertension: Secondary | ICD-10-CM

## 2021-06-05 DIAGNOSIS — R202 Paresthesia of skin: Secondary | ICD-10-CM

## 2021-06-17 ENCOUNTER — Telehealth: Payer: Self-pay | Admitting: Physical Medicine and Rehabilitation

## 2021-06-17 ENCOUNTER — Telehealth: Payer: Self-pay | Admitting: *Deleted

## 2021-06-17 NOTE — Telephone Encounter (Signed)
Patient with diagnosis of afib on warfarin for anticoagulation.    Procedure: CERVICAL INTER LAMINECTOMY C7-T1  Date of procedure: TBD   CHA2DS2-VASc Score = 5   This indicates a 7.2% annual risk of stroke. The patient's score is based upon: CHF History: 1 HTN History: 1 Diabetes History: 0 Stroke History: 0 Vascular Disease History: 0 Age Score: 2 Gender Score: 1      Per office protocol, patient can hold warfarin for 5 days prior to procedure.   Patient will NOT need bridging with Lovenox (enoxaparin) around procedure.

## 2021-06-17 NOTE — Telephone Encounter (Signed)
Charlotte Castro with [name] called stating a referral was faxed at 1:54 today and when it was sent through the line explaining what the procedure is was cut off. Arbie Cookey would like to have this refaxed please and would like [place] to make sure the fax goes completely through.   Fax# [phone]* Atten: pre-op team

## 2021-06-17 NOTE — Telephone Encounter (Signed)
Requesting office called back with needed information for pre op clearance.     Pre-operative Risk Assessment    Patient Name: Charlotte Castro  DOB: 1932/07/22 MRN: [id number]      Request for Surgical Clearance    Procedure:   CERVICAL INTER LAMINECTOMY C7-T1   Date of Surgery:  Clearance TBD                                 Surgeon:  DR. Brandt Loosen Group or Practice Name:  [facility] Phone number:  [phone] Fax number:  [phone]   Type of Clearance Requested:   - Medical  - Pharmacy:  Hold Warfarin (Coumadin) x 5 DAYS PRIOR TO PROCEDURE   Type of Anesthesia:   STEROID CORTIZONE    Additional requests/questions:    Jiles Prows   06/17/2021, 3:59 PM

## 2021-06-17 NOTE — Telephone Encounter (Signed)
Clinical pharmacist to review Coumadin ?

## 2021-06-17 NOTE — Telephone Encounter (Signed)
I called the requesting office that not all information came over on the fax for clearance. Requesting office will fax over new clearance. Missing what the procedure is.

## 2021-06-17 NOTE — Telephone Encounter (Signed)
Left a message for the pt that she is going to need a sooner appt for pre op clearance. I called the pt to offer an appt 06/26/21 @ 2 pm ok per Fraser Din, RN for Dr. Mingo Amber. Left message for the pt to call back ASAP with hopes that it is still available.

## 2021-06-17 NOTE — Telephone Encounter (Signed)
° °  Name: Charlotte Castro  DOB: 07/24/1932  MRN: [id number]  Primary Cardiologist: Larae Grooms, MD  Chart reviewed as part of pre-operative protocol coverage. Because of Charlotte Castro's past medical history and time since last visit, she will require a follow-up visit in order to better assess preoperative cardiovascular risk.  Pre-op covering staff: - Please schedule appointment and call patient to inform them. If patient already had an upcoming appointment within acceptable timeframe, please add "pre-op clearance" to the appointment notes so provider is aware. - Please contact requesting surgeon's office via preferred method (i.e, phone, fax) to inform them of need for appointment prior to surgery.  If applicable, this message will also be routed to pharmacy pool and/or primary cardiologist for input on holding anticoagulant/antiplatelet agent as requested below so that this information is available to the clearing provider at time of patient's appointment.   [place], [place]  06/17/2021, 4:20 PM

## 2021-06-18 NOTE — Telephone Encounter (Signed)
Pt called back. Operator Hazle Quant called me to confirm if ok to [place] the 2:20 48 hour acute time slot. I answered no as Pat A. RN gave the ok to use the 2 pm slot that was on hold. Hazle Quant cancelled the 2:20 appt and I scheduled the pt in the 2 pm slot 06/26/21 with Dr. Irish Lack for pre op clearance. I asked Hazle Quant to please inform the pt of the appt 2 pm 06/26/21 with Dr. Irish Lack. I will forward notes to MD for upcoming appt. Will send FYI to requesting office. Once pt has been cleared, MD will fax his ov note giving clearance along with any recommendations, medications, etc.

## 2021-06-19 ENCOUNTER — Other Ambulatory Visit: Payer: Self-pay | Admitting: Orthopaedic Surgery

## 2021-06-19 ENCOUNTER — Telehealth: Payer: Self-pay | Admitting: Physical Medicine and Rehabilitation

## 2021-06-19 MED ORDER — ACETAMINOPHEN-CODEINE #3 300-30 MG PO TABS: 1.0000 | ORAL_TABLET | Freq: Three times a day (TID) | ORAL | 0 refills | Status: DC | PRN

## 2021-06-19 NOTE — Telephone Encounter (Signed)
Patient returned call asked for a call back    [phone]

## 2021-06-25 NOTE — Progress Notes (Signed)
Cardiology Office Note   Date:  06/26/2021   ID:  Charlotte Castro, DOB November 29, 1932, MRN [id number]  PCP:  Isaac Bliss, Rayford Halsted, MD    Chief Complaint  Patient presents with   Follow-up    Pre op clearance   Preoperative cardiovascular examination  Wt Readings from Last 3 Encounters:  06/26/21 196 lb (88.9 kg)  05/14/21 204 lb 3.2 oz (92.6 kg)  04/29/21 197 lb (89.4 kg)       History of Present Illness: Charlotte Castro is a 88 y.o. female   who has had several different types of tachyarrhythmia and bradycardia dating back to 2012.  Pearline Cables coating noted on her eye, thought to be related to amiodarone many years ago.    She had a tremor and had been started on Parkinson's medicines many years ago. These were stopped. The patient disagreed with the diagnosis of Parkinsons. She was seen in the emergency room for syncope in 2014. Her main complaint is feeling weak and having joint pains. She does admit to significant anxiety which happens at random times. The daughter felt that anxiety is a big component of the patient's illness, when she was at the visit in 2/14.  In the past, she had a large hiatal hernia and was referred to surgery, but she declined an operation. She is trying to stick to a soft diet.     She had GYN surgery, records reveal: " On 09/20/2017, the patient underwent the following: Procedure(s): XI ROBOTIC ASSISTED TOTAL HYSTERECTOMY WITH BILATERAL SALPINGO OOPHORECTOMY, SENTINAL LYMPH NODE BIOPSY.   The postoperative course was uneventful.  She was discharged to home on postoperative day 1 tolerating a regular diet, ambulating, pain controlled. She is to resume her Coumadin 5 days after surgery. She was treated with radiation as well.     She had some gall stones removed.   In 2018, nuclear stress test showed: "No reversible ischemia or infarction.   2. Normal left ventricular wall motion.   3. Left ventricular ejection fraction 68%"  She has developed  worsening right shoulder pain due to end-stage arthritis.  She discussed surgery with the orthopedics team but declined.  She is hoping to have relief with more conservative measures.  She continues to have shortness of breath.  Denies : Chest pain. Dizziness. Leg edema. Nitroglycerin use. Orthopnea. Palpitations. Paroxysmal nocturnal dyspnea. Syncope.    Activity is limited.  She lives alone but her daughter helps with groceries.   Past Medical History:  Diagnosis Date   Anemia    history of   Anxiety    Arthritis    "left knee" (04/05/2017)   Atrial fibrillation (HCC)    Atrial flutter ([place])    typical appearing   Atrial tachycardia ([place])    ablated 11/17/10  by JA  from the [facility] of the aorta   CHF (congestive heart failure) (HCC)    Chronic lower back pain    Chronic nausea    Endometrial cancer (HCC)    grade 1   Gallstone pancreatitis    Gastritis    GERD (gastroesophageal reflux disease)    History of hiatal hernia    HTN (hypertension)    Hyperlipemia    Hypothyroidism    Obesity    Osteoporosis    Toe ulcer ([place])    left 3rd toe   Vitamin D deficiency     Past Surgical History:  Procedure Laterality Date   ATRIAL ABLATION SURGERY  11/17/10  Atrial tachycardia arising from [facility] of the aorta ablated by [place] CATHETERIZATION  01/11/2011   Archie Endo 01/12/2011   CHOLECYSTECTOMY N/A 04/08/2017   Procedure: LAPAROSCOPIC CHOLECYSTECTOMY;  Surgeon: Georganna Skeans, MD;  Location: [place];  Service: General;  Laterality: N/A;   FRACTURE SURGERY     HYSTEROSCOPY WITH D & C N/A 08/05/2017   Procedure: DILATATION AND CURETTAGE /HYSTEROSCOPY AND POLYPECTOMY;  Surgeon: Osborne Oman, MD;  Location: [facility];  Service: Gynecology;  Laterality: N/A;   LUMBAR SPINE SURGERY  ~ [place] SALPINGO OOPHERECTOMY Bilateral 09/20/2017   Procedure: XI ROBOTIC ASSISTED TOTAL HYSTERECTOMY WITH BILATERAL SALPINGO  OOPHORECTOMY, SENTINAL LYMPH NODE BIOPSY;  Surgeon: Everitt Amber, MD;  Location: [facility];  Service: Gynecology;  Laterality: Bilateral;   SHOULDER SURGERY Right 1984 X2   /ntoes 01/19/2011   WRIST FRACTURE SURGERY Left 1983     Current Outpatient Medications  Medication Sig Dispense Refill   acetaminophen (TYLENOL) 650 MG CR tablet Take 650 mg by mouth 2 (two) times daily.     cholecalciferol (VITAMIN D3) 25 MCG (1000 UNIT) tablet Take 1,000 Units by mouth daily.     diclofenac Sodium (VOLTAREN) 1 % GEL Apply 4 g topically 4 (four) times daily. 150 g 1   gabapentin (NEURONTIN) 100 MG capsule TAKE ONE CAPSULE TWICE A DAY 60 capsule 0   levothyroxine (SYNTHROID) 88 MCG tablet TAKE ONE TABLET EACH DAY BEFORE BREAKFAST 90 tablet 1   metoprolol tartrate (LOPRESSOR) 50 MG tablet TAKE ONE TABLET TWICE DAILY WITH A MEAL 180 tablet 0   pantoprazole (PROTONIX) 40 MG tablet Take 1 tablet (40 mg total) by mouth daily for 14 days. 14 tablet 0   vitamin B-12 (CYANOCOBALAMIN) 1000 MCG tablet Take 5,000 mcg by mouth See admin instructions. Take 5,000 mcg by mouth one to two times a day     warfarin (COUMADIN) 5 MG tablet Take as directed by Anticoagulation Clinic. 30 tablet 1   traMADol (ULTRAM) 50 MG tablet Take 1 tablet (50 mg total) by mouth every 6 (six) hours as needed. 30 tablet 0   No current facility-administered medications for this visit.    Allergies:   Iodinated contrast media, Zosyn [piperacillin sod-tazobactam so], and Penicillins    Social History:  The patient  reports that she has never smoked. She has never used smokeless tobacco. She reports that she does not drink alcohol and does not use drugs.   Family History:  The patient's family history includes Heart attack in her father; Hypertension in her father.    ROS:  Please see the history of present illness.   Otherwise, review of systems are positive for chronic DOE.   All other systems are reviewed and negative.    PHYSICAL  EXAM: VS:  BP 102/68    Pulse (!) 103    Ht 5' 9.5" (1.765 m)    Wt 196 lb (88.9 kg)    SpO2 99%    BMI 28.53 kg/m  , BMI Body mass index is 28.53 kg/m. GEN: Well nourished, well developed, in no acute distress; appears frail HEENT: normal Neck: no JVD, carotid bruits, or masses Cardiac: RRR; no murmurs, rubs, or gallops,no edema  Respiratory:  clear to auscultation bilaterally, normal work of breathing GI: soft, nontender, nondistended, + BS MS: no deformity or atrophy Skin: warm and dry, no rash Neuro:  Strength and sensation are intact Psych: euthymic mood,  full affect   EKG:   The ekg ordered today demonstrates sinus tachycardia Heart rate 103 Recent Labs: 01/13/2021: ALT 16; B Natriuretic Peptide 125.5 01/15/2021: Magnesium 1.7 04/29/2021: BUN 25; Creatinine, Ser 0.98; Hemoglobin 15.7; Platelets 227.0; Potassium 4.4; Sodium 140; TSH 1.81   Lipid Panel No results found for: CHOL, TRIG, HDL, CHOLHDL, VLDL, LDLCALC, LDLDIRECT   Other studies Reviewed: Additional studies/ records that were reviewed today with results demonstrating: records reviewed.Normal renal function in 11/22   ASSESSMENT AND PLAN:  AFib: Appears to be in a sinus tachycardia versus atrial at this time. Chronic fatigue/SHOB: Likely multifactorial.  No prior CAD in the past that catheterization.  Check echocardiogram given prior tachycardia to make sure she does not have any kind of tachycardia induced cardiomyopathy. HTN: The current medical regimen is effective;  continue present plan and medications. Anticoagulated: Coumadin for stroke prevention.  No recent falls.  Will check INR today. Preoperative cardiovascular examination: Currently no plans for surgery.  Hopefully, her symptoms will improve with injections.   Current medicines are reviewed at length with the patient today.  The patient concerns regarding her medicines were addressed.  The following changes have been made:  No change  Labs/ tests  ordered today include: echo No orders of the defined types were placed in this encounter.   Recommend 150 minutes/week of aerobic exercise Low fat, low carb, high fiber diet recommended  Disposition:   FU in 1 year   Signed, Larae Grooms, MD  06/26/2021 2:38 PM    [facility] [address], [place], [place]  [address] Phone: [phone]; Fax: [phone]

## 2021-06-26 ENCOUNTER — Ambulatory Visit (INDEPENDENT_AMBULATORY_CARE_PROVIDER_SITE_OTHER): Payer: Medicare Other

## 2021-06-26 ENCOUNTER — Ambulatory Visit: Payer: Medicare Other | Admitting: Interventional Cardiology

## 2021-06-26 ENCOUNTER — Encounter: Payer: Self-pay | Admitting: Interventional Cardiology

## 2021-06-26 ENCOUNTER — Ambulatory Visit (INDEPENDENT_AMBULATORY_CARE_PROVIDER_SITE_OTHER): Payer: Medicare Other | Admitting: Interventional Cardiology

## 2021-06-26 ENCOUNTER — Other Ambulatory Visit: Payer: Self-pay

## 2021-06-26 VITALS — BP 102/68 | HR 103 | Ht 69.5 in | Wt 196.0 lb

## 2021-06-26 DIAGNOSIS — I4821 Permanent atrial fibrillation: Secondary | ICD-10-CM

## 2021-06-26 DIAGNOSIS — R5382 Chronic fatigue, unspecified: Secondary | ICD-10-CM

## 2021-06-26 DIAGNOSIS — R0609 Other forms of dyspnea: Secondary | ICD-10-CM

## 2021-06-26 DIAGNOSIS — Z01810 Encounter for preprocedural cardiovascular examination: Secondary | ICD-10-CM | POA: Diagnosis not present

## 2021-06-26 DIAGNOSIS — Z5181 Encounter for therapeutic drug level monitoring: Secondary | ICD-10-CM

## 2021-06-26 DIAGNOSIS — I10 Essential (primary) hypertension: Secondary | ICD-10-CM

## 2021-06-26 LAB — POCT INR: INR: 3.4 — AB (ref 2.0–3.0)

## 2021-06-26 NOTE — Patient Instructions (Signed)
Medication Instructions:  Your physician recommends that you continue on your current medications as directed. Please refer to the Current Medication list given to you today.  *If you need a refill on your cardiac medications before your next appointment, please call your pharmacy*   Lab Work: none If you have labs (blood work) drawn today and your tests are completely normal, you will receive your results only by: [employer] (if you have MyChart) OR A paper copy in the mail If you have any lab test that is abnormal or we need to change your treatment, we will call you to review the results.   Testing/Procedures: Your physician has requested that you have an echocardiogram. Echocardiography is a painless test that uses sound waves to create images of your heart. It provides your doctor with information about the size and shape of your heart and how well your hearts chambers and valves are working. This procedure takes approximately one hour. There are no restrictions for this procedure.    Follow-Up: At [facility], you and your health needs are our priority.  As part of our continuing mission to provide you with exceptional heart care, we have created designated Provider Care Teams.  These Care Teams include your primary Cardiologist (physician) and Advanced Practice Providers (APPs -  Physician Assistants and Nurse Practitioners) who all work together to provide you with the care you need, when you need it.  We recommend signing up for the patient portal called "MyChart".  Sign up information is provided on this After Visit Summary.  MyChart is used to connect with patients for Virtual Visits (Telemedicine).  Patients are able to view lab/test results, encounter notes, upcoming appointments, etc.  Non-urgent messages can be sent to your provider as well.   To learn more about what you can do with MyChart, go to [email or web address].    Your next appointment:   12  month(s)  The format for your next appointment:   In Person  Provider:   Larae Grooms, MD     Other Instructions

## 2021-06-26 NOTE — Patient Instructions (Signed)
Description   Only take 0.5 tablet today and then continue taking Warfarin 1 tablet daily except for 1/2 tablet on Tuesdays and Saturdays. Call Coumadin Clinic for any changes in medications or upcoming procedures. Recheck INR in 3 weeks.  Coumadin Clinic [phone]

## 2021-06-30 NOTE — Telephone Encounter (Signed)
° °  Primary Cardiologist: Larae Grooms, MD  Chart reviewed as part of pre-operative protocol coverage. Given past medical history and time since last visit, based on ACC/AHA guidelines, Charlotte Castro would be at acceptable risk for the planned procedure without further cardiovascular testing.   Her Coumadin may be held for 5 days prior to her injection.  Please resume as soon as hemostasis is achieved.  I will route this recommendation to the requesting party via Epic fax function and remove from pre-op pool.  Please call with questions.  Jossie Ng. Lilyanna Lunt NP-C    06/30/2021, 2:58 PM [address] Office [phone] Fax [phone]

## 2021-06-30 NOTE — Telephone Encounter (Addendum)
I s/w Charlotte Castro with Dr. Romona Curls office. Charlotte Castro, is inquiring if the pt has been cleared. Pt recently saw Dr. Irish Lack 06/26/21. In his A&P Dr. Irish Lack state: Preoperative cardiovascular examination: Currently no plans for surgery.  Hopefully, her symptoms will improve with injections.  Clearance is for injection. I assured Charlotte Castro that I will send to our pre op provider and pharm-d as they are requesting to have pt hold coumadin x 5 days prior to injection. Charlotte Castro thanked me for the call back and the help. Once we have finalized clearance we will fax clearance.   It does look like clearance was given ok to hold coumadin x 5 days, see notes from Pharm-D Marcelle Overlie.

## 2021-06-30 NOTE — Telephone Encounter (Signed)
Charlotte Castro with Dr. Romona Curls office is following up regarding clearance. Specifically, she would like to know whether or not patient has been approved to hold blood thinner for cervical injection. Please advise.

## 2021-07-09 ENCOUNTER — Ambulatory Visit (INDEPENDENT_AMBULATORY_CARE_PROVIDER_SITE_OTHER): Payer: Medicare Other | Admitting: Physical Medicine and Rehabilitation

## 2021-07-09 ENCOUNTER — Other Ambulatory Visit: Payer: Self-pay

## 2021-07-09 ENCOUNTER — Ambulatory Visit: Payer: Self-pay

## 2021-07-09 ENCOUNTER — Encounter: Payer: Self-pay | Admitting: Physical Medicine and Rehabilitation

## 2021-07-09 DIAGNOSIS — M25511 Pain in right shoulder: Secondary | ICD-10-CM

## 2021-07-09 DIAGNOSIS — M47812 Spondylosis without myelopathy or radiculopathy, cervical region: Secondary | ICD-10-CM

## 2021-07-09 DIAGNOSIS — M5412 Radiculopathy, cervical region: Secondary | ICD-10-CM

## 2021-07-09 DIAGNOSIS — G8929 Other chronic pain: Secondary | ICD-10-CM | POA: Diagnosis not present

## 2021-07-09 DIAGNOSIS — M542 Cervicalgia: Secondary | ICD-10-CM | POA: Diagnosis not present

## 2021-07-09 NOTE — Progress Notes (Signed)
Pt state right shoulder pain. Pt state any movement with her right arm makes the pain worse. Pt state she takes pain meds to help ease her pain.  Numeric Pain Rating Scale and Functional Assessment Average Pain 8   In the last MONTH (on 0-10 scale) has pain interfered with the following?  1. General activity like being  able to carry out your everyday physical activities such as walking, climbing stairs, carrying groceries, or moving a chair?  Rating(10)   +BT, -Dye Allergies.  Dr. Ernestina Patches, per my review of the chart patient does list contrast dye allergy but this does not seem to be related to allergic symptoms itself but probably the procedure she had done.

## 2021-07-09 NOTE — Progress Notes (Signed)
Charlotte Castro - 88 y.o. female MRN [id number]  Date of birth: 03-19-1933  Office Visit Note: Visit Date: 07/09/2021 PCP: Isaac Bliss, Rayford Halsted, MD Referred by: Isaac Bliss, [place]*  Subjective: Chief Complaint  Patient presents with   Right Shoulder - Pain   Neck - Pain   Right Arm - Pain   HPI:  Charlotte Castro is a 88 y.o. female who comes in todayAt the request of Dr. Jean Rosenthal for evaluation and management of severe right shoulder pain but with referral of pain down the arm into the hand.  She also reports some lateral right-sided neck pain but also pain Epimed the top of her head.  She clearly suffers from significant anxiety and this is listed on her medical history.  She also suffers from significant tremor.  She is clearly in a lot of pain and rates her pain as an 8 out of 10 and really comments a lot of suffering with the amount of pain she is having in the shoulder.  She reports this is nonstop pain but is worse when she moves her arm.  She reports an MRI of the cervical spine was performed by her primary doctors after she complained of pain going to the top of her head.  I did have a long discussion with her concerning pain generators and nerve distribution and the fact that cervical spine issues would not really cause pain to the top of the head.  She denies any left-sided complaints.  No focal weakness of the hands.  No real paresthesias just pain down to the hand.  She does not store somewhat of a positive flick sign on the right.  She has had medication management and therapy without any much relief.  Dr. Ninfa Linden has evaluated her and felt like she was having symptoms potentially of the shoulder and the neck itself.  MRI of the cervical spine was reviewed and really just shows multilevel spondylitic change fairly normal for her age.  No high-grade stenosis or nerve compression.  X-ray of the glenohumeral joint does show significant arthritis.  She does list is a  allergy to contrast media shortness of breath and headache.  I feel like that is likely Sharol Given what ever procedure was being done at the time as that is not a typical contrast allergy symptomatology.  Review of Systems  Musculoskeletal:  Positive for joint pain and neck pain.  Neurological:  Positive for tremors.  All other systems reviewed and are negative. Otherwise per HPI.  Assessment & Plan: Visit Diagnoses:    ICD-10-CM   1. Chronic right shoulder pain  M25.511 Large Joint Inj: R glenohumeral   G89.29 XR C-ARM NO REPORT    2. Cervicalgia  M54.2     3. Cervical spondylosis without myelopathy  M47.812     4. Cervical radiculopathy  M54.12       Plan: Findings:  1.  Clearly she is having pain from the glenohumeral joint arthritis with painful range of motion and I think she is getting just extra referral pattern down the arm more of a referred pain from the joint exacerbated by may be an underlying central sensitization type pain syndrome with her anxiety.  We will complete intra-articular shoulder injection today with fluoroscopic guidance.  We will use a small bit of contrast given that the symptoms noted from contrast media allergy or not really allergic symptoms.  As of note post procedure she did have significant relief of her shoulder and  arm pain even the referral pain to the hand.  2.  Neck pain and arm pain I believe are related to the shoulder but cannot rule out radiculopathy radiculitis.  MRI findings are really not suggestive of really nerve compression although she does have some foraminal narrowing.  We will have her scheduled to come back in a couple weeks for potential cervical injection depending on how well she does with the shoulder injection.  She is on chronic anticoagulation with Coumadin.  She does have permission to stop the Coumadin for the cervical injection if need be.  I will have her stop her Coumadin 5 days before that visit.  She was made aware of this.    Meds & Orders: No orders of the defined types were placed in this encounter.   Orders Placed This Encounter  Procedures   Large Joint Inj: R glenohumeral   XR C-ARM NO REPORT    Follow-up: Return in about 2 weeks (around 07/23/2021).   Procedures: Large Joint Inj: R glenohumeral on 07/09/2021 2:53 PM Indications: pain and diagnostic evaluation Details: 22 G 3.5 in needle, fluoroscopy-guided anteromedial approach  Arthrogram: No  Medications: 40 mg triamcinolone acetonide 40 MG/ML; 5 mL bupivacaine 0.25 % Outcome: tolerated well, no immediate complications  There was excellent flow of contrast producing a partial arthrogram of the glenohumeral joint. The patient did have relief of symptoms during the anesthetic phase of the injection. Procedure, treatment alternatives, risks and benefits explained, specific risks discussed. Consent was given by the patient. Immediately prior to procedure a time out was called to verify the correct patient, procedure, equipment, support staff and site/side marked as required. Patient was prepped and draped in the usual sterile fashion.         Clinical History: RIGHT SHOULDER - 2+ VIEW   COMPARISON:  01/19/2019   FINDINGS: Advanced DJD in the glenohumeral joint with remodeling of the humeral head, progressive since prior study. Probable osteochondral free body in the inferior aspect of the shoulder joint. No fracture or dislocation. Large hiatal hernia partially visualized.   IMPRESSION: 1. Advanced progressive glenohumeral DJD without fracture or other acute finding.     Electronically Signed By: Lucrezia Europe M.D. On: 05/16/2021 12:36 --- MRI CERVICAL SPINE WITHOUT CONTRAST   TECHNIQUE: Multiplanar, multisequence MR imaging of the cervical spine was performed. No intravenous contrast was administered.   COMPARISON:  None.   FINDINGS: Alignment: Slight anterolisthesis C3-4. Mild retrolisthesis C4-5, C5-6   Vertebrae: Normal bone  marrow. Negative for acute fracture or mass. Probable mild chronic fracture superior endplate of T2.   Cord: Normal signal and morphology   Posterior Fossa, vertebral arteries, paraspinal tissues: Negative   Disc levels:   C2-3: Mild degenerative change.  Negative for stenosis   C3-4: Mild disc and facet degeneration. Mild foraminal narrowing bilaterally   C4-5: Disc degeneration with diffuse uncinate spurring. Bilateral facet degeneration. Moderate foraminal encroachment bilaterally   C5-6: Disc degeneration with diffuse uncinate spurring. Moderate foraminal stenosis bilaterally   C6-7: Disc degeneration and mild uncinate spurring. Mild foraminal narrowing bilaterally   C7-T1: Negative   IMPRESSION: Multilevel disc degeneration and spurring in the cervical spine. Spinal and foraminal stenosis as above.   Negative for acute fracture or mass.     Electronically Signed   By: Franchot Gallo M.D.   On: 01/14/2021 16:40     Objective:  VS:  HT:     WT:    BMI:      BP:  HR: bpm   TEMP: ( )   RESP:  Physical Exam Vitals and nursing note reviewed.  Constitutional:      General: She is not in acute distress.    Appearance: Normal appearance. She is not ill-appearing.  HENT:     Head: Normocephalic and atraumatic.     Right Ear: External ear normal.     Left Ear: External ear normal.  Eyes:     Extraocular Movements: Extraocular movements intact.  Cardiovascular:     Rate and Rhythm: Normal rate.     Pulses: Normal pulses.  Musculoskeletal:     Cervical back: Tenderness present. No rigidity.     Right lower leg: No edema.     Left lower leg: No edema.     Comments: Patient has good strength in the upper extremities including 5 out of 5 strength in wrist extension long finger flexion and APB.  There is no atrophy of the hands intrinsically.  There is a negative Hoffmann's test. Right shoulder lacks ROM and is painful with provocation.   Lymphadenopathy:      Cervical: No cervical adenopathy.  Skin:    Findings: No erythema, lesion or rash.  Neurological:     General: No focal deficit present.     Mental Status: She is alert and oriented to person, place, and time.     Sensory: No sensory deficit.     Motor: No weakness or abnormal muscle tone.     Coordination: Coordination normal.     Gait: Gait normal.     Comments: tremor  Psychiatric:        Mood and Affect: Mood normal.        Behavior: Behavior normal.     Imaging: XR C-ARM NO REPORT  Result Date: 07/09/2021 Please see Notes tab for imaging impression.

## 2021-07-10 ENCOUNTER — Encounter: Payer: Self-pay | Admitting: Physical Medicine and Rehabilitation

## 2021-07-10 MED ORDER — BUPIVACAINE HCL 0.25 % IJ SOLN
5.0000 mL | INTRAMUSCULAR | Status: AC | PRN
  Administered 2021-07-09: 5 mL via INTRA_ARTICULAR

## 2021-07-10 MED ORDER — TRIAMCINOLONE ACETONIDE 40 MG/ML IJ SUSP
40.0000 mg | INTRAMUSCULAR | Status: AC | PRN
  Administered 2021-07-09: 40 mg via INTRA_ARTICULAR

## 2021-07-10 NOTE — Addendum Note (Signed)
Addended by: Raymondo Band on: 07/10/2021 05:19 AM   Modules accepted: Level of Service

## 2021-07-14 ENCOUNTER — Other Ambulatory Visit: Payer: Self-pay

## 2021-07-14 DIAGNOSIS — R202 Paresthesia of skin: Secondary | ICD-10-CM

## 2021-07-14 MED ORDER — GABAPENTIN 100 MG PO CAPS: 100.0000 mg | ORAL_CAPSULE | Freq: Two times a day (BID) | ORAL | 0 refills | Status: DC

## 2021-07-15 ENCOUNTER — Ambulatory Visit (INDEPENDENT_AMBULATORY_CARE_PROVIDER_SITE_OTHER): Payer: Medicare Other

## 2021-07-15 ENCOUNTER — Ambulatory Visit (HOSPITAL_COMMUNITY): Payer: Medicare Other | Attending: Cardiology

## 2021-07-15 ENCOUNTER — Other Ambulatory Visit: Payer: Self-pay

## 2021-07-15 DIAGNOSIS — R0609 Other forms of dyspnea: Secondary | ICD-10-CM | POA: Insufficient documentation

## 2021-07-15 DIAGNOSIS — Z5181 Encounter for therapeutic drug level monitoring: Secondary | ICD-10-CM | POA: Diagnosis not present

## 2021-07-15 DIAGNOSIS — I4821 Permanent atrial fibrillation: Secondary | ICD-10-CM

## 2021-07-15 LAB — ECHOCARDIOGRAM COMPLETE
Area-P 1/2: 5.25 cm2
P 1/2 time: 894 msec
S' Lateral: 2.85 cm

## 2021-07-15 LAB — POCT INR: INR: 1.8 — AB (ref 2.0–3.0)

## 2021-07-15 NOTE — Patient Instructions (Signed)
Description   Hold Warfarin per pre-procedure instructions and then resume taking Warfarin 1 tablet daily except for 1/2 tablet on Tuesdays and Saturdays post procedure. Call Coumadin Clinic for any changes in medications or upcoming procedures. Recheck INR 1 week post procedure.  Coumadin Clinic [phone]

## 2021-07-20 ENCOUNTER — Ambulatory Visit (INDEPENDENT_AMBULATORY_CARE_PROVIDER_SITE_OTHER): Payer: Medicare Other | Admitting: Physical Medicine and Rehabilitation

## 2021-07-20 ENCOUNTER — Other Ambulatory Visit: Payer: Self-pay

## 2021-07-20 ENCOUNTER — Encounter: Payer: Self-pay | Admitting: Physical Medicine and Rehabilitation

## 2021-07-20 ENCOUNTER — Ambulatory Visit: Payer: Self-pay

## 2021-07-20 VITALS — BP 130/87 | HR 108

## 2021-07-20 DIAGNOSIS — M25511 Pain in right shoulder: Secondary | ICD-10-CM

## 2021-07-20 DIAGNOSIS — G8929 Other chronic pain: Secondary | ICD-10-CM | POA: Diagnosis not present

## 2021-07-20 DIAGNOSIS — R202 Paresthesia of skin: Secondary | ICD-10-CM

## 2021-07-20 DIAGNOSIS — M5412 Radiculopathy, cervical region: Secondary | ICD-10-CM

## 2021-07-20 DIAGNOSIS — M19011 Primary osteoarthritis, right shoulder: Secondary | ICD-10-CM

## 2021-07-20 DIAGNOSIS — M25512 Pain in left shoulder: Secondary | ICD-10-CM | POA: Diagnosis not present

## 2021-07-20 MED ORDER — METHYLPREDNISOLONE ACETATE 80 MG/ML IJ SUSP: 80.0000 mg | Freq: Once | INTRAMUSCULAR | Status: DC

## 2021-07-20 NOTE — Progress Notes (Signed)
Last injection helped 50%, still has n/t in the hands bilat. Follow up with [facility]. May need EMG/[place] with NEUROLOGY. Poor historian. Re-start coumadin today.  Could not get on table for cervical injection, did Left GH injection. Pt state right and left shoulder pain. Pt state any movement with her right arm makes the pain worse. Pt state she takes pain meds to help ease her pain.  Numeric Pain Rating Scale and Functional Assessment Average Pain 3   In the last MONTH (on 0-10 scale) has pain interfered with the following?  1. General activity like being  able to carry out your everyday physical activities such as walking, climbing stairs, carrying groceries, or moving a chair?  Rating(9)   +Driver, -BT, -Dye Allergies.

## 2021-07-20 NOTE — Progress Notes (Signed)
Charlotte Castro - 88 y.o. female MRN [id number]  Date of birth: [birth year]  Office Visit Note: Visit Date: 07/20/2021 PCP: Isaac Bliss, Rayford Halsted, MD Referred by: Isaac Bliss, Estel*  Subjective: Chief Complaint  Patient presents with   Right Shoulder - Pain   Left Shoulder - Pain   HPI:  Charlotte Castro is a 88 y.o. female who comes in today for what was planned to be a cervical epidural injection as outlined in our last office note.  By way of brief review and the patient is a very poor historian with multiple medical issues ongoing, but basically has been followed and managed by Dr. Jean Rosenthal and Benita Stabile, P.A.-C with complaints of neck and right more than left shoulder pain but also with paresthesia into the hands in a somewhat nondermatomal fashion somewhat more radial digits bilaterally.  After failure of conservative care with medication management exercise time etc. patient was seen for diagnostic and therapeutic right shoulder injection.  As noted in the last note the shoulder injection was performed because the patient had a great deal of pain with any movement of the right shoulder and she does have known arthritis of the right shoulder.  Her case is complicated by contrast media allergy but actually did well with small amount of contrast in the arthrogram.  When we saw her last the symptoms were mainly right shoulder pain although she had any other symptoms of paresthesia and left shoulder pain her biggest issue was the right shoulder.  Diagnostically the injection at that time gave her quite a bit of relief during the anesthetic phase and she still reports 50% relief of the shoulder pain on the right.  At that time we told her that we had an appointment set up for 2 weeks for possible cervical injection depending on how well she was doing.  We had her stop her Coumadin 5 days before and there is been some issue with her understanding of the anticoagulation issue.  She  comes in today without any real recollection of the cervical injection talked that we had and is saying that her left shoulder is really problematic.  She reports that any movement hurts that she still comments over and over that something needs to be done with her pain.  I explained to her that the cervical imaging that we have is not very bad but could cause a radiculitis and she is getting symptoms in the hands.  At this point we have decided to do the cervical epidural injection because we already had her off the anticoagulation however she was unable to get onto the table into a position that we could do the injection.  Review of Systems  Musculoskeletal:  Positive for joint pain and neck pain.  Neurological:  Positive for tingling.  All other systems reviewed and are negative. Otherwise per HPI.  Assessment & Plan: Visit Diagnoses:    ICD-10-CM   1. Cervical radiculopathy  M54.12 XR C-ARM NO REPORT    Epidural Steroid injection    methylPREDNISolone acetate (DEPO-MEDROL) injection 80 mg    2. Paresthesia of skin  R20.2     3. Chronic pain of both shoulders  M25.511 Large Joint Inj: L glenohumeral   G89.29    M25.512     4. Arthritis of right glenohumeral joint  M19.011       Plan: Findings:  1.  Bilateral shoulder pain with right more than left glenohumeral joint arthritis.  Diagnostic intra-articular injection  did help and is helped at least 50% but she still reports a lot of pain.  Left-sided complaints of the shoulder as well with movement.  Imaging does not show as much arthritis.  Because she could not perform the cervical injection and likely will not be able to we decided to complete a left intra-articular injection today and we will see how she does with that and she will follow-up with Dr. Ninfa Linden.  2.  Neck pain and paresthesias into both hands.  Could not complete cervical epidural injection and I would not recommend one at this point given the imaging findings and the  fact that she just cannot tolerate that.  I do think her biggest complaint is the shoulder arthritis however she does get paresthesias into the hands.  I would recommend if needed electrodiagnostic study of both hands and neck to be done at neurology as I do not know that I can do the test justice in our office.   Meds & Orders:  Meds ordered this encounter  Medications   methylPREDNISolone acetate (DEPO-MEDROL) injection 80 mg    Orders Placed This Encounter  Procedures   Large Joint Inj: L glenohumeral   XR C-ARM NO REPORT   Epidural Steroid injection    Follow-up: Return in about 2 weeks (around 08/03/2021) for Jean Rosenthal, MD.   Procedures: Large Joint Inj: L glenohumeral on 07/20/2021 10:30 AM Indications: pain and diagnostic evaluation Details: 22 G 3.5 in needle, fluoroscopy-guided anteromedial approach  Arthrogram: No  Medications: 40 mg triamcinolone acetonide 40 MG/ML; 5 mL bupivacaine 0.25 % Outcome: tolerated well, no immediate complications  There was excellent flow of contrast producing a partial arthrogram of the glenohumeral joint. The patient did have relief of symptoms during the anesthetic phase of the injection. Procedure, treatment alternatives, risks and benefits explained, specific risks discussed. Consent was given by the patient. Immediately prior to procedure a time out was called to verify the correct patient, procedure, equipment, support staff and site/side marked as required. Patient was prepped and draped in the usual sterile fashion.         Clinical History: RIGHT SHOULDER - 2+ VIEW   COMPARISON:  01/19/2019   FINDINGS: Advanced DJD in the glenohumeral joint with remodeling of the humeral head, progressive since prior study. Probable osteochondral free body in the inferior aspect of the shoulder joint. No fracture or dislocation. Large hiatal hernia partially visualized.   IMPRESSION: 1. Advanced progressive glenohumeral DJD without  fracture or other acute finding.     Electronically Signed By: Lucrezia Europe M.D. On: 05/16/2021 12:36 --- MRI CERVICAL SPINE WITHOUT CONTRAST   TECHNIQUE: Multiplanar, multisequence MR imaging of the cervical spine was performed. No intravenous contrast was administered.   COMPARISON:  None.   FINDINGS: Alignment: Slight anterolisthesis C3-4. Mild retrolisthesis C4-5, C5-6   Vertebrae: Normal bone marrow. Negative for acute fracture or mass. Probable mild chronic fracture superior endplate of T2.   Cord: Normal signal and morphology   Posterior Fossa, vertebral arteries, paraspinal tissues: Negative   Disc levels:   C2-3: Mild degenerative change.  Negative for stenosis   C3-4: Mild disc and facet degeneration. Mild foraminal narrowing bilaterally   C4-5: Disc degeneration with diffuse uncinate spurring. Bilateral facet degeneration. Moderate foraminal encroachment bilaterally   C5-6: Disc degeneration with diffuse uncinate spurring. Moderate foraminal stenosis bilaterally   C6-7: Disc degeneration and mild uncinate spurring. Mild foraminal narrowing bilaterally   C7-T1: Negative   IMPRESSION: Multilevel disc degeneration and spurring  in the cervical spine. Spinal and foraminal stenosis as above.   Negative for acute fracture or mass.     Electronically Signed   By: Franchot Gallo M.D.   On: 01/14/2021 16:40     Objective:  VS:  HT:     WT:    BMI:      BP:130/87   HR:(!) 108bpm   TEMP: ( )   RESP:  Physical Exam Vitals and nursing note reviewed.  Constitutional:      General: She is not in acute distress.    Appearance: Normal appearance. She is not ill-appearing.  HENT:     Head: Normocephalic and atraumatic.     Right Ear: External ear normal.     Left Ear: External ear normal.  Eyes:     Extraocular Movements: Extraocular movements intact.  Cardiovascular:     Rate and Rhythm: Normal rate.     Pulses: Normal pulses.  Musculoskeletal:      Cervical back: Tenderness present. No rigidity.     Right lower leg: No edema.     Left lower leg: No edema.     Comments: Patient has good strength in the upper extremities including 5 out of 5 strength in wrist extension long finger flexion and APB.  There is no atrophy of the hands intrinsically.  There is a negative Hoffmann's test.  She has an equivocal Spurling's test with essential tremor evident.  She has pain with rotation of both shoulders.   Lymphadenopathy:     Cervical: No cervical adenopathy.  Skin:    Findings: No erythema, lesion or rash.  Neurological:     General: No focal deficit present.     Mental Status: She is alert and oriented to person, place, and time.     Sensory: No sensory deficit.     Motor: No weakness or abnormal muscle tone.     Coordination: Coordination normal.  Psychiatric:        Mood and Affect: Mood normal.        Behavior: Behavior normal.     Imaging: XR C-ARM NO REPORT  Result Date: 07/20/2021 Please see Notes tab for imaging impression.

## 2021-07-21 MED ORDER — BUPIVACAINE HCL 0.25 % IJ SOLN
5.0000 mL | INTRAMUSCULAR | Status: AC | PRN
  Administered 2021-07-20: 5 mL via INTRA_ARTICULAR

## 2021-07-21 MED ORDER — TRIAMCINOLONE ACETONIDE 40 MG/ML IJ SUSP
40.0000 mg | INTRAMUSCULAR | Status: AC | PRN
  Administered 2021-07-20: 40 mg via INTRA_ARTICULAR

## 2021-08-05 ENCOUNTER — Ambulatory Visit: Payer: Medicare Other | Admitting: Orthopaedic Surgery

## 2021-08-26 ENCOUNTER — Other Ambulatory Visit: Payer: Self-pay | Admitting: Internal Medicine

## 2021-08-26 ENCOUNTER — Other Ambulatory Visit: Payer: Self-pay | Admitting: Interventional Cardiology

## 2021-08-26 DIAGNOSIS — I4821 Permanent atrial fibrillation: Secondary | ICD-10-CM

## 2021-08-26 DIAGNOSIS — R202 Paresthesia of skin: Secondary | ICD-10-CM

## 2021-08-26 NOTE — Telephone Encounter (Signed)
Prescription refill request received for warfarin ?Lov: 06/26/21 [place]) ?Next INR check: 07/27/21 - Pt overdue ?Warfarin tablet strength: '5mg'$  ? ?Pt over due for INR check. Called pt and explained the need for anticoagulation visit for further refills. Pt stated her son had not been able to bring her to appts but she will ask her son when he he is available to bring her and will call back to r/s. Pt also stated she has enough Warfarin to last until she can r/s appt.  ? ? ?

## 2021-09-01 ENCOUNTER — Other Ambulatory Visit: Payer: Self-pay

## 2021-09-01 ENCOUNTER — Ambulatory Visit (INDEPENDENT_AMBULATORY_CARE_PROVIDER_SITE_OTHER): Payer: Medicare Other

## 2021-09-01 DIAGNOSIS — I4821 Permanent atrial fibrillation: Secondary | ICD-10-CM

## 2021-09-01 DIAGNOSIS — Z5181 Encounter for therapeutic drug level monitoring: Secondary | ICD-10-CM | POA: Diagnosis not present

## 2021-09-01 LAB — POCT INR: INR: 2.1 (ref 2.0–3.0)

## 2021-09-01 NOTE — Patient Instructions (Signed)
Description   ?Start taking Warfarin 1/2 tablet daily except for 1 tablet on Mondays, Wednesdays and Fridays. Call [facility] for any changes in medications or upcoming procedures. Recheck INR 2 weeks, pt states cannot return until 4 weeks.  [facility] [phone]  ?  ?  ?

## 2021-09-09 ENCOUNTER — Ambulatory Visit: Payer: Medicare Other | Admitting: Interventional Cardiology

## 2021-09-25 ENCOUNTER — Other Ambulatory Visit: Payer: Self-pay | Admitting: Interventional Cardiology

## 2021-09-25 DIAGNOSIS — I10 Essential (primary) hypertension: Secondary | ICD-10-CM

## 2021-09-25 DIAGNOSIS — I4821 Permanent atrial fibrillation: Secondary | ICD-10-CM

## 2021-09-25 NOTE — Telephone Encounter (Signed)
Prescription refill request received for warfarin ?Lov: 06/26/2021 ?Next INR check: 4/18 ?Warfarin tablet strength: '5mg'$   ? ?Refill sent.  ?

## 2021-09-29 ENCOUNTER — Ambulatory Visit (INDEPENDENT_AMBULATORY_CARE_PROVIDER_SITE_OTHER): Payer: Medicare Other | Admitting: *Deleted

## 2021-09-29 DIAGNOSIS — Z5181 Encounter for therapeutic drug level monitoring: Secondary | ICD-10-CM

## 2021-09-29 DIAGNOSIS — I4821 Permanent atrial fibrillation: Secondary | ICD-10-CM | POA: Diagnosis not present

## 2021-09-29 LAB — POCT INR: INR: 2.3 (ref 2.0–3.0)

## 2021-09-29 NOTE — Patient Instructions (Signed)
Description   Continue taking Warfarin 1/2 tablet daily except for 1 tablet on Mondays, Wednesdays and Fridays. Call [facility] Clinic for any changes in medications or upcoming procedures. Recheck INR 4 weeks, aware of risks.  [facility] [phone]      

## 2021-09-30 ENCOUNTER — Other Ambulatory Visit: Payer: Self-pay | Admitting: *Deleted

## 2021-09-30 DIAGNOSIS — E039 Hypothyroidism, unspecified: Secondary | ICD-10-CM

## 2021-09-30 DIAGNOSIS — R202 Paresthesia of skin: Secondary | ICD-10-CM

## 2021-09-30 MED ORDER — LEVOTHYROXINE SODIUM 88 MCG PO TABS: ORAL_TABLET | ORAL | 1 refills | Status: DC

## 2021-10-01 ENCOUNTER — Other Ambulatory Visit: Payer: Self-pay | Admitting: Internal Medicine

## 2021-10-01 DIAGNOSIS — R202 Paresthesia of skin: Secondary | ICD-10-CM

## 2021-10-01 MED ORDER — GABAPENTIN 100 MG PO CAPS: 100.0000 mg | ORAL_CAPSULE | Freq: Two times a day (BID) | ORAL | 2 refills | Status: DC

## 2021-10-27 ENCOUNTER — Ambulatory Visit (INDEPENDENT_AMBULATORY_CARE_PROVIDER_SITE_OTHER): Payer: Medicare Other | Admitting: *Deleted

## 2021-10-27 DIAGNOSIS — Z5181 Encounter for therapeutic drug level monitoring: Secondary | ICD-10-CM | POA: Diagnosis not present

## 2021-10-27 DIAGNOSIS — I4821 Permanent atrial fibrillation: Secondary | ICD-10-CM | POA: Diagnosis not present

## 2021-10-27 LAB — POCT INR: INR: 1.8 — AB (ref 2.0–3.0)

## 2021-10-27 NOTE — Patient Instructions (Signed)
Description   ?Today take 1 tablet then continue taking Warfarin 1/2 tablet daily except for 1 tablet on Mondays, Wednesdays and Fridays. Call [facility] Clinic for any changes in medications or upcoming procedures. Recheck INR 4 weeks, aware of risks.  [facility] [phone]  ?  ?  ?

## 2021-11-16 ENCOUNTER — Other Ambulatory Visit: Payer: Self-pay | Admitting: Interventional Cardiology

## 2021-11-16 DIAGNOSIS — I4821 Permanent atrial fibrillation: Secondary | ICD-10-CM

## 2021-11-16 NOTE — Telephone Encounter (Signed)
Request received for warfarin refill:  Last INR was 1.8 on 10/27/21 Next INR due on 6/13/233 LOV was 06/26/21  Lendell Caprice MD  Refill approved.

## 2021-11-24 ENCOUNTER — Ambulatory Visit (INDEPENDENT_AMBULATORY_CARE_PROVIDER_SITE_OTHER): Payer: Medicare Other | Admitting: *Deleted

## 2021-11-24 DIAGNOSIS — I4821 Permanent atrial fibrillation: Secondary | ICD-10-CM | POA: Diagnosis not present

## 2021-11-24 DIAGNOSIS — Z5181 Encounter for therapeutic drug level monitoring: Secondary | ICD-10-CM

## 2021-11-24 LAB — POCT INR: INR: 2.3 (ref 2.0–3.0)

## 2021-11-24 NOTE — Patient Instructions (Addendum)
Description   Continue taking Warfarin 1/2 tablet daily except for 1 tablet on Mondays, Wednesdays and Fridays. Call [facility] Clinic for any changes in medications or upcoming procedures. Recheck INR 4 weeks, aware of risks.  [facility] [phone]      

## 2021-12-22 ENCOUNTER — Ambulatory Visit (INDEPENDENT_AMBULATORY_CARE_PROVIDER_SITE_OTHER): Payer: Medicare Other | Admitting: *Deleted

## 2021-12-22 DIAGNOSIS — Z5181 Encounter for therapeutic drug level monitoring: Secondary | ICD-10-CM | POA: Diagnosis not present

## 2021-12-22 DIAGNOSIS — I4821 Permanent atrial fibrillation: Secondary | ICD-10-CM | POA: Diagnosis not present

## 2021-12-22 LAB — POCT INR: INR: 2.8 (ref 2.0–3.0)

## 2021-12-22 NOTE — Patient Instructions (Signed)
Description   Continue taking Warfarin 1/2 tablet daily except for 1 tablet on Mondays, Wednesdays and Fridays. Call [facility] Clinic for any changes in medications or upcoming procedures. Recheck INR 4 weeks, aware of risks.  [facility] [phone]

## 2022-01-19 ENCOUNTER — Ambulatory Visit (INDEPENDENT_AMBULATORY_CARE_PROVIDER_SITE_OTHER): Payer: Medicare Other

## 2022-01-19 DIAGNOSIS — Z5181 Encounter for therapeutic drug level monitoring: Secondary | ICD-10-CM

## 2022-01-19 DIAGNOSIS — I4821 Permanent atrial fibrillation: Secondary | ICD-10-CM | POA: Diagnosis not present

## 2022-01-19 LAB — POCT INR: INR: 3.2 — AB (ref 2.0–3.0)

## 2022-01-19 NOTE — Patient Instructions (Signed)
Continue taking Warfarin 1/2 tablet daily except for 1 tablet on Mondays, Wednesdays and Fridays.  EAT GREENS TONIGHT.  Call [facility] Clinic for any changes in medications or upcoming procedures. Recheck INR 4 weeks, aware of risks.  [facility] [phone]

## 2022-01-22 ENCOUNTER — Ambulatory Visit: Payer: Self-pay

## 2022-01-22 NOTE — Patient Outreach (Signed)
  Care Coordination   01/22/2022 Name: Charlotte Castro MRN: [id number] DOB: 09-May-1933   Care Coordination Outreach Attempts:  An unsuccessful telephone outreach was attempted today to offer the patient information about available care coordination services as a benefit of their health plan.   Follow Up Plan:  Additional outreach attempts will be made to offer the patient care coordination information and services.   Encounter Outcome:  No Answer  Care Coordination Interventions Activated:  No   Care Coordination Interventions:  No, not indicated    Daneen Schick, BSW, CDP Social Worker, Certified Dementia Practitioner Care Coordination [phone]

## 2022-02-03 ENCOUNTER — Other Ambulatory Visit: Payer: Self-pay | Admitting: *Deleted

## 2022-02-03 NOTE — Patient Outreach (Signed)
  Care Coordination   02/03/2022 Name: Charlotte Castro MRN: [id number] DOB: 1933/02/01   Care Coordination Outreach Attempts:  A second unsuccessful outreach was attempted today to offer the patient with information about available care coordination services as a benefit of their health plan.     Follow Up Plan:  Additional outreach attempts will be made to offer the patient care coordination information and services.   Encounter Outcome:  No Answer  Care Coordination Interventions Activated:  No   Care Coordination Interventions:  No, not indicated    Raina Mina, RN Care Management Coordinator [place] [facility] [phone]

## 2022-02-11 ENCOUNTER — Ambulatory Visit: Payer: Self-pay

## 2022-02-11 NOTE — Patient Instructions (Signed)
Visit Information  Thank you for taking time to visit with me today. Please don't hesitate to contact me if I can be of assistance to you.   Following are the goals we discussed today:   Goals Addressed             This Visit's Progress    COMPLETED: Care Coordination Activities       Care Coordination Interventions: SDoH screening performed - no acute resource challenges identified. Patient reports her son assists with transportation needs Determined the patient does not have concerns with medication costs and/or adherence Discussed patient sometimes feels dizzy in the mornings but is careful and utilizes DME in the home as needed Reviewed role of care coordination team - patient declined follow up at this time Performed chart review to note upcomming appointments - patient aware Instructed patient to contact her primary care provider as needed        Please call the care guide team at [phone] if you need to schedule an appointment with me.  If you are experiencing a Mental Health or [place] or need someone to talk to, please call 1-800-273-TALK (toll free, 24 hour hotline)  Patient verbalizes understanding of instructions and care plan provided today and agrees to view in [place]. Active MyChart status and patient understanding of how to access instructions and care plan via MyChart confirmed with patient.     No further follow up required: Please contact your primary care provider as needed  Daneen Schick, BSW, CDP Social Worker, Certified Dementia Practitioner Care Coordination [phone]

## 2022-02-11 NOTE — Patient Outreach (Signed)
  Care Coordination   Initial Visit Note   02/11/2022 Name: Charlotte Castro MRN: [id number] DOB: 02/01/1933  Charlotte Castro is a 88 y.o. year old female who sees Charlotte Castro, Charlotte Halsted, MD for primary care. I spoke with  Charlotte Castro by phone today.  What matters to the patients health and wellness today?  Doing well, no concerns at this time    Goals Addressed             This Visit's Progress    COMPLETED: Care Coordination Activities       Care Coordination Interventions: SDoH screening performed - no acute resource challenges identified. Patient reports her son assists with transportation needs Determined the patient does not have concerns with medication costs and/or adherence Discussed patient sometimes feels dizzy in the mornings but is careful and utilizes DME in the home as needed Reviewed role of care coordination team - patient declined follow up at this time Performed chart review to note upcomming appointments - patient aware Instructed patient to contact her primary care provider as needed        SDOH assessments and interventions completed:  Yes  SDOH Interventions Today    Flowsheet Row Most Recent Value  SDOH Interventions   Food Insecurity Interventions Intervention Not Indicated  Housing Interventions Intervention Not Indicated  Transportation Interventions Intervention Not Indicated        Care Coordination Interventions Activated:  Yes  Care Coordination Interventions:  Yes, provided   Follow up plan: No further intervention required.   Encounter Outcome:  Pt. Visit Completed   Charlotte Castro, BSW, CDP Social Worker, Certified Dementia Practitioner Care Coordination [phone]

## 2022-02-19 ENCOUNTER — Other Ambulatory Visit: Payer: Self-pay | Admitting: Internal Medicine

## 2022-02-19 DIAGNOSIS — R202 Paresthesia of skin: Secondary | ICD-10-CM

## 2022-02-23 ENCOUNTER — Ambulatory Visit: Payer: Medicare Other | Attending: Cardiology | Admitting: *Deleted

## 2022-02-23 DIAGNOSIS — I4821 Permanent atrial fibrillation: Secondary | ICD-10-CM | POA: Diagnosis not present

## 2022-02-23 DIAGNOSIS — Z5181 Encounter for therapeutic drug level monitoring: Secondary | ICD-10-CM

## 2022-02-23 LAB — POCT INR: INR: 2 (ref 2.0–3.0)

## 2022-02-23 MED ORDER — WARFARIN SODIUM 5 MG PO TABS: ORAL_TABLET | ORAL | 3 refills | Status: DC

## 2022-02-23 NOTE — Patient Instructions (Addendum)
Description   Continue taking Warfarin 1/2 tablet daily except for 1 tablet on Mondays, Wednesdays and Fridays. Call Coumadin Clinic for any changes in medications or upcoming procedures. Recheck INR 3 weeks, aware of risks.  [facility] [phone] or [phone]

## 2022-03-16 ENCOUNTER — Ambulatory Visit: Payer: Medicare Other | Attending: Cardiology

## 2022-03-16 DIAGNOSIS — Z5181 Encounter for therapeutic drug level monitoring: Secondary | ICD-10-CM | POA: Diagnosis not present

## 2022-03-16 DIAGNOSIS — I4821 Permanent atrial fibrillation: Secondary | ICD-10-CM

## 2022-03-16 LAB — POCT INR: INR: 2.2 (ref 2.0–3.0)

## 2022-03-16 NOTE — Patient Instructions (Signed)
Continue taking Warfarin 1/2 tablet daily except for 1 tablet on Mondays, Wednesdays and Fridays. Call [facility]in Clinic for any changes in medications or upcoming procedures. Recheck INR 4 weeks, aware of risks.  [facility] [phone] or [phone]

## 2022-03-25 ENCOUNTER — Other Ambulatory Visit: Payer: Self-pay | Admitting: Internal Medicine

## 2022-03-25 DIAGNOSIS — E039 Hypothyroidism, unspecified: Secondary | ICD-10-CM

## 2022-04-01 ENCOUNTER — Emergency Department (HOSPITAL_COMMUNITY)
Admission: EM | Admit: 2022-04-01 | Discharge: 2022-04-01 | Disposition: A | Discharge status: Home / Self Care | Payer: Medicare Other | Attending: Emergency Medicine | Admitting: Emergency Medicine

## 2022-04-01 ENCOUNTER — Other Ambulatory Visit: Payer: Self-pay

## 2022-04-01 ENCOUNTER — Encounter (HOSPITAL_COMMUNITY): Payer: Self-pay | Admitting: Emergency Medicine

## 2022-04-01 ENCOUNTER — Emergency Department (HOSPITAL_COMMUNITY): Payer: Medicare Other

## 2022-04-01 DIAGNOSIS — E039 Hypothyroidism, unspecified: Secondary | ICD-10-CM | POA: Diagnosis not present

## 2022-04-01 DIAGNOSIS — I509 Heart failure, unspecified: Secondary | ICD-10-CM | POA: Insufficient documentation

## 2022-04-01 DIAGNOSIS — I4891 Unspecified atrial fibrillation: Secondary | ICD-10-CM | POA: Insufficient documentation

## 2022-04-01 DIAGNOSIS — R0789 Other chest pain: Secondary | ICD-10-CM | POA: Insufficient documentation

## 2022-04-01 DIAGNOSIS — Z7902 Long term (current) use of antithrombotics/antiplatelets: Secondary | ICD-10-CM | POA: Insufficient documentation

## 2022-04-01 DIAGNOSIS — I110 Hypertensive heart disease with heart failure: Secondary | ICD-10-CM | POA: Insufficient documentation

## 2022-04-01 DIAGNOSIS — R0602 Shortness of breath: Secondary | ICD-10-CM | POA: Diagnosis not present

## 2022-04-01 DIAGNOSIS — Z79899 Other long term (current) drug therapy: Secondary | ICD-10-CM | POA: Insufficient documentation

## 2022-04-01 DIAGNOSIS — R791 Abnormal coagulation profile: Secondary | ICD-10-CM | POA: Insufficient documentation

## 2022-04-01 LAB — BASIC METABOLIC PANEL
Anion gap: 10 (ref 5–15)
BUN: 19 mg/dL (ref 8–23)
CO2: 25 mmol/L (ref 22–32)
Calcium: 9.8 mg/dL (ref 8.9–10.3)
Chloride: 108 mmol/L (ref 98–111)
Creatinine, Ser: 0.98 mg/dL (ref 0.44–1.00)
GFR, Estimated: 56 mL/min — ABNORMAL LOW (ref >60)
Glucose, Bld: 122 mg/dL — ABNORMAL HIGH (ref 70–99)
Potassium: 4.8 mmol/L (ref 3.5–5.1)
Sodium: 143 mmol/L (ref 135–145)

## 2022-04-01 LAB — BRAIN NATRIURETIC PEPTIDE: B Natriuretic Peptide: 385.6 pg/mL — ABNORMAL HIGH (ref 0.0–100.0)

## 2022-04-01 LAB — CBC
HCT: 39.2 % (ref 36.0–46.0)
Hemoglobin: 13.9 g/dL (ref 12.0–15.0)
MCH: 33.5 pg (ref 26.0–34.0)
MCHC: 35.5 g/dL (ref 30.0–36.0)
MCV: 94.5 fL (ref 80.0–100.0)
Platelets: 193 10*3/uL (ref 150–400)
RBC: 4.15 MIL/uL (ref 3.87–5.11)
RDW: 13 % (ref 11.5–15.5)
WBC: 11.4 10*3/uL — ABNORMAL HIGH (ref 4.0–10.5)
nRBC: 0 % (ref 0.0–0.2)

## 2022-04-01 LAB — TROPONIN I (HIGH SENSITIVITY)
Troponin I (High Sensitivity): 29 ng/L — ABNORMAL HIGH (ref <18)
Troponin I (High Sensitivity): 25 ng/L — ABNORMAL HIGH (ref <18)

## 2022-04-01 LAB — PROTIME-INR
INR: 2.1 — ABNORMAL HIGH (ref 0.8–1.2)
Prothrombin Time: 23.3 seconds — ABNORMAL HIGH (ref 11.4–15.2)

## 2022-04-01 NOTE — Discharge Instructions (Addendum)
You were seen in the emergency department for evaluation of your chest pain.  The fact that this pain is tender whenever you press in your chest, is likely is musculoskeletal.  Regardless, I would like for you to follow-up again with your primary care provider and your cardiologist soon.  Please make sure you call them to schedule appointments. Try using heat or ice packs to the area. You can take Tylenol as needed for pain. If you have any concerns, new or worsening symptoms, please turn to the nearest emergency department for evaluation.   Contact a doctor if: Your chest pain does not go away. You feel depressed. You have a fever. Get help right away if: Your chest pain is worse. You have a cough that gets worse, or you cough up blood. You have very bad (severe) pain in your belly (abdomen). You pass out (faint). You have either of these for no clear reason: Sudden chest discomfort. Sudden discomfort in your arms, back, neck, or jaw. You have shortness of breath at any time. You suddenly start to sweat, or your skin gets clammy. You feel sick to your stomach (nauseous). You throw up (vomit). You suddenly feel lightheaded or dizzy. You feel very weak or tired. Your heart starts to beat fast, or it feels like it is skipping beats. These symptoms may be an emergency. Do not wait to see if the symptoms will go away. Get medical help right away. Call your local emergency services (911 in the [place] Do not drive yourself to the hospital.

## 2022-04-01 NOTE — ED Notes (Signed)
Received verbal report from [place] at this time

## 2022-04-01 NOTE — ED Provider Notes (Signed)
[facility] EMERGENCY DEPARTMENT Provider Note   CSN: [id number] Arrival date & time: 04/01/22  1332     History Chief Complaint  Patient presents with   Chest Pain    Charlotte Castro is a 88 y.o. female with history of CHF, A-fib, hypertension, hypothyroidism presents to the emergency department for evaluation of chest pain that is left-sided and happened 3 days ago.  She reports that she chronically has shortness of breath of the past 3 to 4 years, but denies any worsening of her shortness of breath within the past few days.  She denies any leg swelling.  She reports that she was sitting down when she started having some left-sided chest pain that she felt radiated around to her left upper back.  She denies any nausea, vomiting, abdominal pain, fevers, cough or cold symptoms.  She reports that the pain lasted for little bit and then went away.  She reports that she usually has chest pains, however had not experienced chest pain like that before and wanted to be evaluated for this.  She is currently on Coumadin-warfarin for her atrial fibrillation.  Denies any new falls or trauma.   Chest Pain Associated symptoms: shortness of breath   Associated symptoms: no abdominal pain, no back pain, no cough, no fever, no nausea and no vomiting        Home Medications Prior to Admission medications   Medication Sig Start Date End Date Taking? Authorizing Provider  acetaminophen (TYLENOL) 650 MG CR tablet Take 650 mg by mouth 2 (two) times daily.    [provider]  cholecalciferol (VITAMIN D3) 25 MCG (1000 UNIT) tablet Take 1,000 Units by mouth daily.    [provider]  diclofenac Sodium (VOLTAREN) 1 % GEL Apply 4 g topically 4 (four) times daily. 04/29/21   Martinique, Betty G, MD  gabapentin (NEURONTIN) 100 MG capsule TAKE ONE CAPSULE BY MOUTH TWICE DAILY 02/24/22   Isaac Bliss, Rayford Halsted, MD  levothyroxine (SYNTHROID) 88 MCG tablet TAKE ONE TABLET BY MOUTH  EVERY DAY BEFORE BREAKFAST 03/25/22   Isaac Bliss, Rayford Halsted, MD  metoprolol tartrate (LOPRESSOR) 50 MG tablet TAKE ONE TABLET TWICE DAILY WITH A MEAL 09/25/21   Jettie Booze, MD  pantoprazole (PROTONIX) 40 MG tablet Take 1 tablet (40 mg total) by mouth daily for 14 days. 01/15/21 06/26/21  Antonieta Pert, MD  traMADol (ULTRAM) 50 MG tablet Take 1 tablet (50 mg total) by mouth every 6 (six) hours as needed. 06/03/21   Pete Pelt, PA-C  vitamin B-12 (CYANOCOBALAMIN) 1000 MCG tablet Take 5,000 mcg by mouth See admin instructions. Take 5,000 mcg by mouth one to two times a day    [provider]  warfarin (COUMADIN) 5 MG tablet TAKE ONE-HALF TO ONE TABLET BY MOUTH DAILY AS DIRECTED BY COUMADIN CLINIC 02/23/22   Jettie Booze, MD      Allergies    Iodinated contrast media, Zosyn [piperacillin sod-tazobactam so], and Penicillins    Review of Systems   Review of Systems  Constitutional:  Negative for chills and fever.  Respiratory:  Positive for shortness of breath. Negative for cough.   Cardiovascular:  Positive for chest pain. Negative for leg swelling.  Gastrointestinal:  Negative for abdominal pain, constipation, diarrhea, nausea and vomiting.  Genitourinary:  Negative for dysuria and hematuria.  Musculoskeletal:  Negative for back pain.    Physical Exam Updated Vital Signs BP (!) 113/91 (BP Location: Left Arm)   Pulse  66   Temp 98.7 F (37.1 C)   Resp 16   Ht '5\' 9"'$  (1.753 m)   Wt 88 kg   SpO2 100%   BMI 28.65 kg/m  Physical Exam Vitals and nursing note reviewed.  Constitutional:      General: She is not in acute distress.    Appearance: She is not ill-appearing or toxic-appearing.     Comments: Slight tremors noted to upper extremities.  Pleasant, no acute distress.  Cardiovascular:     Rate and Rhythm: Normal rate. Rhythm irregular.     Comments: Pulses intact and symmetric. Pulmonary:     Effort: Pulmonary effort is normal. No respiratory distress.      Breath sounds: Normal breath sounds.  Chest:     Chest wall: Tenderness present. No crepitus.     Comments: Does have diffuse left-sided chest tenderness to palpation, mainly to the left mid/lower chest.  No crepitus.  No overlying rashes. Abdominal:     Palpations: Abdomen is soft.     Tenderness: There is no abdominal tenderness. There is no guarding or rebound.  Musculoskeletal:     Right lower leg: No tenderness. No edema.     Left lower leg: No tenderness. No edema.     Comments: Brawny lower legs however nontender to palpation.  Coloration and temperature appear and feel symmetric.  Skin:    General: Skin is warm and dry.  Neurological:     Mental Status: She is alert.     ED Results / Procedures / Treatments   Labs (all labs ordered are listed, but only abnormal results are displayed) Labs Reviewed  BASIC METABOLIC PANEL - Abnormal; Notable for the following components:      Result Value   Glucose, Bld 122 (*)    GFR, Estimated 56 (*)    All other components within normal limits  CBC - Abnormal; Notable for the following components:   WBC 11.4 (*)    All other components within normal limits  TROPONIN I (HIGH SENSITIVITY) - Abnormal; Notable for the following components:   Troponin I (High Sensitivity) 29 (*)    All other components within normal limits  TROPONIN I (HIGH SENSITIVITY)    EKG EKG Interpretation  Date/Time:  Thursday April 01 2022 13:40:29 EDT Ventricular Rate:  79 PR Interval:  152 QRS Duration: 82 QT Interval:  404 QTC Calculation: 463 R Axis:   51 Text Interpretation: Sinus rhythm with Premature supraventricular complexes Otherwise normal ECG When compared with ECG of 15-Jan-2021 15:15, PREVIOUS ECG IS PRESENT Confirmed by Addison Lank [phone]) on 04/02/2022 12:49:55 AM  Radiology DG Chest 2 View  Result Date: 04/01/2022 CLINICAL DATA:  Chest pain and shortness of breath. EXAM: CHEST - 2 VIEW COMPARISON:  Chest radiographs  01/13/2021, CT chest 10/25/2017 FINDINGS: An air-filled oval structure overlying the inferior medial right hemithorax corresponds to the large hiatal hernia/intrathoracic stomach seen on prior CT. Cardiac silhouette and mediastinal contours otherwise within normal limits. Mild chronic bilateral interstitial thickening is unchanged. No pleural effusion or pneumothorax. Mild multilevel degenerative disc changes of the thoracic spine. Cholecystectomy clips. IMPRESSION: 1. No acute cardiopulmonary process. 2. Large hiatal hernia/intrathoracic stomach, as seen on multiple prior studies including prior CT. Electronically Signed   By: Yvonne Kendall M.D.   On: 04/01/2022 14:45    Procedures Procedures   Medications Ordered in ED Medications - No data to display  ED Course/ Medical Decision Making/ A&P  Medical Decision Making Amount and/or Complexity of Data Reviewed Labs: ordered.   88 year old female presents emerged department for evaluation of chest pain.  Differential diagnosis includes was limited to ACS, arrhythmia, PE, pneumonia, pneumothorax, aneurysm, dissection, costochondritis, MSK, anxiety, GERD.  Vital signs show elevated blood pressure 122/85, afebrile, normal pulse rate, satting well on room air without increased work of breathing.  Physical exam as noted above.  Basic chest pain work-up initiated while in triage.  I have added on a BNP for the patient.  On prior chart review, I read the cardiology note from 06-26-2021.  Patient has chronic shortness of breath which they likely think is multifactorial.  They also mention the patient has "significant anxiety which happens at random times".  Per HPI, the daughter felt that the anxiety was a big component of the patient's illness.  I independently reviewed and interpreted the patient's labs.  CBC shows slight elevation white blood count 11.4, no anemia.  BMP at 3-5.6, slightly more elevated than her baseline however  she does not appear to be volume overloaded.  Troponin elevated at 29 with repeat at 25 being hours apart from each other.  INR 2.1.  BMP shows glucose at 122 otherwise no electrolyte abnormality.  X-ray imaging shows no acute pulmonary process.  There is a large hiatal hernia/intrathoracic stomach as seen on multiple prior studies including the prior CT.  Bilateral blood pressures done by nursing staff.  Ericka Pontiff, nurse tach obtained blood pressure.  Reported right arm being 152/63 and left arm being 152/56.  There is no significant blood pressure difference and the fact that her chest pain is reproducible upon palpation, I will suspicion for any dissection at this time.  Her shortness of breath is at her baseline as it has been over the past few years.  She is already on Coumadin and warfarin, will excision for any PE as well.  Her troponins are slightly elevated although they are flat, this is likely her new normal.  We will have the patient follow-up with cardiology regardless.  Her INR is therapeutic.  Unsure of the slightly elevated white blood cell count 11.4, x-ray does not show any signs of pneumonia, her saturation is well on room air, and her breath sounds are clear to auscultation bilaterally.  Given these findings, I think the patient safe for discharge home with close outpatient follow-up.  She has been here for almost 9 hours without any began change in her vital signs or clinical presentation.  Patient does have a PCP follow-up next week.  I discussed with patient to follow-up with her cardiologist and PCP.  We discussed her lab and imaging findings.  We discussed return precautions and red flag symptoms with patient and family member at bedside.  They verbalized understanding and agreed to the plan.  Patient is stable being discharged home in good condition.  I discussed this case with my attending physician who cosigned this note including patient's presenting symptoms, physical  exam, and planned diagnostics and interventions. Attending physician stated agreement with plan or made changes to plan which were implemented.   Attending physician assessed patient at bedside.  Final Clinical Impression(s) / ED Diagnoses Final diagnoses:  Atypical chest pain    Rx / DC Orders ED Discharge Orders     None         Sherrell Puller, PA-C 04/02/22 0103    Audley Hose, MD 04/02/22 2208

## 2022-04-01 NOTE — ED Provider Triage Note (Signed)
Emergency Medicine Provider Triage Evaluation Note  Charlotte Castro , a 88 y.o. female  was evaluated in triage.  Pt complains of chest pain and shortness of breath which have been ongoing for 2 to 3 days.  Patient with history of long-term Coumadin use due to permanent atrial fibrillation, previous stents.  States that pain began on the left side of her chest, felt like a cramping around her heart, with current migration to the right side of her chest and right shoulder.  She endorses significant shortness of breath with exertion.  She denies abdominal pain, headache, urinary symptoms.  Review of Systems  Positive: As above Negative: As above  Physical Exam  BP (!) 146/66 (BP Location: Left Arm)   Pulse 69   Temp 98.1 F (36.7 C) (Oral)   Resp 16   Ht '5\' 9"'$  (1.753 m)   Wt 88 kg   SpO2 99%   BMI 28.65 kg/m  Gen:   Awake, no distress   Resp:  Normal effort  MSK:   Moves extremities without difficulty  Other:    Medical Decision Making  Medically screening exam initiated at 1:57 PM.  Appropriate orders placed.  Charlotte Castro was informed that the remainder of the evaluation will be completed by another provider, this initial triage assessment does not replace that evaluation, and the importance of remaining in the ED until their evaluation is complete.     Charlotte Peng, PA-C 04/01/22 1358

## 2022-04-01 NOTE — ED Triage Notes (Signed)
Pt reports chest tightness for 2 days. Pt breathing heavily but family reports that is her baseline. Hx of stents.

## 2022-04-06 ENCOUNTER — Telehealth: Payer: Self-pay | Admitting: Licensed Clinical Social Worker

## 2022-04-10 ENCOUNTER — Emergency Department (HOSPITAL_COMMUNITY): Payer: Medicare Other

## 2022-04-10 ENCOUNTER — Other Ambulatory Visit: Payer: Self-pay

## 2022-04-10 ENCOUNTER — Inpatient Hospital Stay (HOSPITAL_COMMUNITY)
Admission: EM | Admit: 2022-04-10 | Discharge: 2022-04-14 | DRG: 312 | Disposition: A | Discharge status: Skilled Nursing Facility | Payer: Medicare Other | Attending: Internal Medicine | Admitting: Internal Medicine

## 2022-04-10 DIAGNOSIS — R079 Chest pain, unspecified: Secondary | ICD-10-CM | POA: Diagnosis present

## 2022-04-10 DIAGNOSIS — E039 Hypothyroidism, unspecified: Secondary | ICD-10-CM | POA: Diagnosis present

## 2022-04-10 DIAGNOSIS — N289 Disorder of kidney and ureter, unspecified: Secondary | ICD-10-CM

## 2022-04-10 DIAGNOSIS — Z880 Allergy status to penicillin: Secondary | ICD-10-CM

## 2022-04-10 DIAGNOSIS — R350 Frequency of micturition: Secondary | ICD-10-CM | POA: Diagnosis present

## 2022-04-10 DIAGNOSIS — Z91041 Radiographic dye allergy status: Secondary | ICD-10-CM

## 2022-04-10 DIAGNOSIS — K449 Diaphragmatic hernia without obstruction or gangrene: Secondary | ICD-10-CM | POA: Diagnosis present

## 2022-04-10 DIAGNOSIS — Z6828 Body mass index (BMI) 28.0-28.9, adult: Secondary | ICD-10-CM

## 2022-04-10 DIAGNOSIS — M545 Low back pain, unspecified: Secondary | ICD-10-CM | POA: Diagnosis present

## 2022-04-10 DIAGNOSIS — N1831 Chronic kidney disease, stage 3a: Secondary | ICD-10-CM | POA: Diagnosis present

## 2022-04-10 DIAGNOSIS — Z1152 Encounter for screening for COVID-19: Secondary | ICD-10-CM

## 2022-04-10 DIAGNOSIS — R791 Abnormal coagulation profile: Secondary | ICD-10-CM | POA: Diagnosis present

## 2022-04-10 DIAGNOSIS — F419 Anxiety disorder, unspecified: Secondary | ICD-10-CM | POA: Diagnosis present

## 2022-04-10 DIAGNOSIS — Z79899 Other long term (current) drug therapy: Secondary | ICD-10-CM

## 2022-04-10 DIAGNOSIS — Z7901 Long term (current) use of anticoagulants: Secondary | ICD-10-CM

## 2022-04-10 DIAGNOSIS — R072 Precordial pain: Secondary | ICD-10-CM

## 2022-04-10 DIAGNOSIS — I130 Hypertensive heart and chronic kidney disease with heart failure and stage 1 through stage 4 chronic kidney disease, or unspecified chronic kidney disease: Secondary | ICD-10-CM | POA: Diagnosis present

## 2022-04-10 DIAGNOSIS — E785 Hyperlipidemia, unspecified: Secondary | ICD-10-CM | POA: Diagnosis present

## 2022-04-10 DIAGNOSIS — G8929 Other chronic pain: Secondary | ICD-10-CM | POA: Diagnosis present

## 2022-04-10 DIAGNOSIS — I4821 Permanent atrial fibrillation: Secondary | ICD-10-CM | POA: Diagnosis present

## 2022-04-10 DIAGNOSIS — M810 Age-related osteoporosis without current pathological fracture: Secondary | ICD-10-CM | POA: Diagnosis present

## 2022-04-10 DIAGNOSIS — I5032 Chronic diastolic (congestive) heart failure: Secondary | ICD-10-CM | POA: Diagnosis present

## 2022-04-10 DIAGNOSIS — M5410 Radiculopathy, site unspecified: Secondary | ICD-10-CM | POA: Diagnosis present

## 2022-04-10 DIAGNOSIS — I4892 Unspecified atrial flutter: Secondary | ICD-10-CM | POA: Diagnosis present

## 2022-04-10 DIAGNOSIS — R112 Nausea with vomiting, unspecified: Secondary | ICD-10-CM | POA: Diagnosis present

## 2022-04-10 DIAGNOSIS — R5382 Chronic fatigue, unspecified: Secondary | ICD-10-CM | POA: Diagnosis present

## 2022-04-10 DIAGNOSIS — I951 Orthostatic hypotension: Secondary | ICD-10-CM | POA: Diagnosis not present

## 2022-04-10 DIAGNOSIS — K219 Gastro-esophageal reflux disease without esophagitis: Secondary | ICD-10-CM | POA: Diagnosis present

## 2022-04-10 DIAGNOSIS — N179 Acute kidney failure, unspecified: Secondary | ICD-10-CM | POA: Diagnosis present

## 2022-04-10 DIAGNOSIS — E669 Obesity, unspecified: Secondary | ICD-10-CM | POA: Diagnosis present

## 2022-04-10 DIAGNOSIS — Z90710 Acquired absence of both cervix and uterus: Secondary | ICD-10-CM

## 2022-04-10 DIAGNOSIS — E1122 Type 2 diabetes mellitus with diabetic chronic kidney disease: Secondary | ICD-10-CM | POA: Diagnosis present

## 2022-04-10 DIAGNOSIS — M1712 Unilateral primary osteoarthritis, left knee: Secondary | ICD-10-CM | POA: Diagnosis present

## 2022-04-10 DIAGNOSIS — M129 Arthropathy, unspecified: Secondary | ICD-10-CM | POA: Diagnosis present

## 2022-04-10 DIAGNOSIS — Z8249 Family history of ischemic heart disease and other diseases of the circulatory system: Secondary | ICD-10-CM

## 2022-04-10 DIAGNOSIS — I5189 Other ill-defined heart diseases: Secondary | ICD-10-CM

## 2022-04-10 DIAGNOSIS — I10 Essential (primary) hypertension: Secondary | ICD-10-CM | POA: Diagnosis present

## 2022-04-10 DIAGNOSIS — E559 Vitamin D deficiency, unspecified: Secondary | ICD-10-CM | POA: Diagnosis present

## 2022-04-10 DIAGNOSIS — Z881 Allergy status to other antibiotic agents status: Secondary | ICD-10-CM

## 2022-04-10 DIAGNOSIS — Z8542 Personal history of malignant neoplasm of other parts of uterus: Secondary | ICD-10-CM

## 2022-04-10 DIAGNOSIS — D72829 Elevated white blood cell count, unspecified: Secondary | ICD-10-CM | POA: Diagnosis present

## 2022-04-10 DIAGNOSIS — I4891 Unspecified atrial fibrillation: Secondary | ICD-10-CM

## 2022-04-10 LAB — CBC
HCT: 39.4 % (ref 36.0–46.0)
Hemoglobin: 13.9 g/dL (ref 12.0–15.0)
MCH: 33.3 pg (ref 26.0–34.0)
MCHC: 35.3 g/dL (ref 30.0–36.0)
MCV: 94.5 fL (ref 80.0–100.0)
Platelets: 217 10*3/uL (ref 150–400)
RBC: 4.17 MIL/uL (ref 3.87–5.11)
RDW: 13.2 % (ref 11.5–15.5)
WBC: 13 10*3/uL — ABNORMAL HIGH (ref 4.0–10.5)
nRBC: 0 % (ref 0.0–0.2)

## 2022-04-10 LAB — BASIC METABOLIC PANEL
Anion gap: 10 (ref 5–15)
BUN: 13 mg/dL (ref 8–23)
CO2: 23 mmol/L (ref 22–32)
Calcium: 9 mg/dL (ref 8.9–10.3)
Chloride: 105 mmol/L (ref 98–111)
Creatinine, Ser: 1.21 mg/dL — ABNORMAL HIGH (ref 0.44–1.00)
GFR, Estimated: 43 mL/min — ABNORMAL LOW (ref >60)
Glucose, Bld: 138 mg/dL — ABNORMAL HIGH (ref 70–99)
Potassium: 3.8 mmol/L (ref 3.5–5.1)
Sodium: 138 mmol/L (ref 135–145)

## 2022-04-10 LAB — TROPONIN I (HIGH SENSITIVITY)
Troponin I (High Sensitivity): 16 ng/L (ref <18)
Troponin I (High Sensitivity): 15 ng/L (ref <18)

## 2022-04-10 MED ORDER — ONDANSETRON HCL 4 MG/2ML IJ SOLN: 4.0000 mg | Freq: Once | INTRAMUSCULAR | Status: DC

## 2022-04-10 MED ORDER — ONDANSETRON 4 MG PO TBDP
4.0000 mg | ORAL_TABLET | Freq: Once | ORAL | Status: AC
  Administered 2022-04-10: 4 mg via ORAL

## 2022-04-10 MED ORDER — ACETAMINOPHEN 325 MG PO TABS
650.0000 mg | ORAL_TABLET | Freq: Once | ORAL | Status: AC
  Administered 2022-04-10: 650 mg via ORAL
  Filled 2022-04-10: qty 2

## 2022-04-10 NOTE — ED Triage Notes (Signed)
Pt here with sharp chest pain L and R sided goes to the back bilaterally and her neck. Patient states it hurts so much its hard to breath. Patient has a hx of stent placement prior.This started 3 days ago. Endorses nausea/vomitting.

## 2022-04-11 ENCOUNTER — Encounter (HOSPITAL_COMMUNITY): Payer: Self-pay | Admitting: Emergency Medicine

## 2022-04-11 ENCOUNTER — Observation Stay (HOSPITAL_COMMUNITY): Payer: Medicare Other

## 2022-04-11 DIAGNOSIS — I480 Paroxysmal atrial fibrillation: Secondary | ICD-10-CM

## 2022-04-11 DIAGNOSIS — E039 Hypothyroidism, unspecified: Secondary | ICD-10-CM

## 2022-04-11 DIAGNOSIS — R350 Frequency of micturition: Secondary | ICD-10-CM

## 2022-04-11 DIAGNOSIS — I5189 Other ill-defined heart diseases: Secondary | ICD-10-CM

## 2022-04-11 DIAGNOSIS — R112 Nausea with vomiting, unspecified: Secondary | ICD-10-CM

## 2022-04-11 DIAGNOSIS — D72829 Elevated white blood cell count, unspecified: Secondary | ICD-10-CM | POA: Diagnosis present

## 2022-04-11 DIAGNOSIS — I5032 Chronic diastolic (congestive) heart failure: Secondary | ICD-10-CM

## 2022-04-11 DIAGNOSIS — I4821 Permanent atrial fibrillation: Secondary | ICD-10-CM

## 2022-04-11 DIAGNOSIS — M129 Arthropathy, unspecified: Secondary | ICD-10-CM

## 2022-04-11 DIAGNOSIS — I10 Essential (primary) hypertension: Secondary | ICD-10-CM | POA: Diagnosis not present

## 2022-04-11 DIAGNOSIS — R0789 Other chest pain: Secondary | ICD-10-CM | POA: Diagnosis not present

## 2022-04-11 DIAGNOSIS — N289 Disorder of kidney and ureter, unspecified: Secondary | ICD-10-CM

## 2022-04-11 DIAGNOSIS — M5410 Radiculopathy, site unspecified: Secondary | ICD-10-CM

## 2022-04-11 DIAGNOSIS — R791 Abnormal coagulation profile: Secondary | ICD-10-CM

## 2022-04-11 DIAGNOSIS — Z7901 Long term (current) use of anticoagulants: Secondary | ICD-10-CM

## 2022-04-11 LAB — MAGNESIUM: Magnesium: 1.8 mg/dL (ref 1.7–2.4)

## 2022-04-11 LAB — PROTIME-INR
INR: 3.2 — ABNORMAL HIGH (ref 0.8–1.2)
Prothrombin Time: 32.4 seconds — ABNORMAL HIGH (ref 11.4–15.2)

## 2022-04-11 LAB — SARS CORONAVIRUS 2 BY RT PCR: SARS Coronavirus 2 by RT PCR: NEGATIVE

## 2022-04-11 LAB — TSH: TSH: 0.621 u[IU]/mL (ref 0.350–4.500)

## 2022-04-11 MED ORDER — ONDANSETRON HCL 4 MG PO TABS: 4.0000 mg | ORAL_TABLET | Freq: Four times a day (QID) | ORAL | Status: DC | PRN

## 2022-04-11 MED ORDER — ONDANSETRON 4 MG PO TBDP
8.0000 mg | ORAL_TABLET | ORAL | Status: AC
  Administered 2022-04-11: 8 mg via ORAL
  Filled 2022-04-11: qty 2

## 2022-04-11 MED ORDER — HYDRALAZINE HCL 20 MG/ML IJ SOLN: 10.0000 mg | INTRAMUSCULAR | Status: DC | PRN

## 2022-04-11 MED ORDER — SODIUM CHLORIDE 0.9% FLUSH
3.0000 mL | Freq: Two times a day (BID) | INTRAVENOUS | Status: DC
  Administered 2022-04-11 – 2022-04-13 (×6): 3 mL via INTRAVENOUS

## 2022-04-11 MED ORDER — GABAPENTIN 100 MG PO CAPS
100.0000 mg | ORAL_CAPSULE | Freq: Two times a day (BID) | ORAL | Status: DC
  Administered 2022-04-11 – 2022-04-14 (×7): 100 mg via ORAL
  Filled 2022-04-11 (×7): qty 1

## 2022-04-11 MED ORDER — ONDANSETRON 8 MG PO TBDP: ORAL_TABLET | ORAL | 0 refills | Status: DC

## 2022-04-11 MED ORDER — PANTOPRAZOLE SODIUM 40 MG PO TBEC
40.0000 mg | DELAYED_RELEASE_TABLET | Freq: Every day | ORAL | Status: DC
  Administered 2022-04-11 – 2022-04-14 (×4): 40 mg via ORAL
  Filled 2022-04-11 (×4): qty 1

## 2022-04-11 MED ORDER — LIDOCAINE 5 % EX PTCH: 1.0000 | MEDICATED_PATCH | CUTANEOUS | 0 refills | Status: DC

## 2022-04-11 MED ORDER — METOPROLOL TARTRATE 25 MG PO TABS
25.0000 mg | ORAL_TABLET | Freq: Two times a day (BID) | ORAL | Status: DC
  Administered 2022-04-11 – 2022-04-13 (×6): 25 mg via ORAL
  Filled 2022-04-11 (×6): qty 1

## 2022-04-11 MED ORDER — LIDOCAINE 5 % EX PTCH
3.0000 | MEDICATED_PATCH | CUTANEOUS | Status: DC
  Administered 2022-04-11 – 2022-04-14 (×4): 3 via TRANSDERMAL
  Filled 2022-04-11 (×4): qty 3

## 2022-04-11 MED ORDER — WARFARIN SODIUM 2.5 MG PO TABS
2.5000 mg | ORAL_TABLET | Freq: Once | ORAL | Status: AC
  Administered 2022-04-11: 2.5 mg via ORAL
  Filled 2022-04-11 (×2): qty 1

## 2022-04-11 MED ORDER — LEVOTHYROXINE SODIUM 88 MCG PO TABS
88.0000 ug | ORAL_TABLET | Freq: Every day | ORAL | Status: DC
  Administered 2022-04-11 – 2022-04-14 (×4): 88 ug via ORAL
  Filled 2022-04-11 (×5): qty 1

## 2022-04-11 MED ORDER — ACETAMINOPHEN 325 MG PO TABS
650.0000 mg | ORAL_TABLET | Freq: Four times a day (QID) | ORAL | Status: DC | PRN
  Administered 2022-04-11 – 2022-04-14 (×4): 650 mg via ORAL
  Filled 2022-04-11 (×4): qty 2

## 2022-04-11 MED ORDER — TRAMADOL HCL 50 MG PO TABS
50.0000 mg | ORAL_TABLET | Freq: Four times a day (QID) | ORAL | Status: DC
  Administered 2022-04-11 – 2022-04-14 (×13): 50 mg via ORAL
  Filled 2022-04-11 (×13): qty 1

## 2022-04-11 MED ORDER — ONDANSETRON HCL 4 MG/2ML IJ SOLN: 4.0000 mg | Freq: Four times a day (QID) | INTRAMUSCULAR | Status: DC | PRN

## 2022-04-11 MED ORDER — POTASSIUM CHLORIDE CRYS ER 20 MEQ PO TBCR
20.0000 meq | EXTENDED_RELEASE_TABLET | ORAL | Status: AC
  Administered 2022-04-11: 20 meq via ORAL
  Filled 2022-04-11: qty 1

## 2022-04-11 MED ORDER — PANTOPRAZOLE SODIUM 20 MG PO TBEC: 20.0000 mg | DELAYED_RELEASE_TABLET | Freq: Every day | ORAL | 0 refills | Status: DC

## 2022-04-11 MED ORDER — ACETAMINOPHEN 650 MG RE SUPP: 650.0000 mg | Freq: Four times a day (QID) | RECTAL | Status: DC | PRN

## 2022-04-11 MED ORDER — ACETAMINOPHEN 500 MG PO TABS
1000.0000 mg | ORAL_TABLET | ORAL | Status: AC
  Administered 2022-04-11: 1000 mg via ORAL
  Filled 2022-04-11: qty 2

## 2022-04-11 MED ORDER — DILTIAZEM LOAD VIA INFUSION
10.0000 mg | Freq: Once | INTRAVENOUS | Status: AC
  Administered 2022-04-11: 10 mg via INTRAVENOUS
  Filled 2022-04-11: qty 10

## 2022-04-11 MED ORDER — WARFARIN - PHARMACIST DOSING INPATIENT: Freq: Every day | Status: DC

## 2022-04-11 MED ORDER — ALBUTEROL SULFATE (2.5 MG/3ML) 0.083% IN NEBU: 2.5000 mg | INHALATION_SOLUTION | Freq: Four times a day (QID) | RESPIRATORY_TRACT | Status: DC | PRN

## 2022-04-11 MED ORDER — DILTIAZEM HCL-DEXTROSE 125-5 MG/125ML-% IV SOLN (PREMIX)
5.0000 mg/h | INTRAVENOUS | Status: DC
  Administered 2022-04-11: 10 mg/h via INTRAVENOUS
  Filled 2022-04-11: qty 125

## 2022-04-11 MED ORDER — ALUM & MAG HYDROXIDE-SIMETH 200-200-20 MG/5ML PO SUSP
30.0000 mL | ORAL | Status: AC
  Administered 2022-04-11: 30 mL via ORAL
  Filled 2022-04-11: qty 30

## 2022-04-11 NOTE — H&P (Addendum)
History and Physical    Patient: Charlotte Castro TIW:[id number] DOB: [birth year] DOA: 04/10/2022 DOS: the patient was seen and examined on 04/11/2022 PCP: Isaac Bliss, Rayford Halsted, MD  Patient coming from: Home lives alone via EMS  Chief Complaint:  Chief Complaint  Patient presents with   Back Pain   Chest Pain   HPI: Charlotte Castro is a 88 y.o. female with medical history significant of hypertension, hyperlipidemia, atrial fibrillation/flutter on Coumadin, CHF, hypothyroidism, anxiety, osteoarthritis, chronic nausea, GERD presents with complaints of back and chest pain.  Patient gives history of arthritis in her left knee with cramping, shortness of breath, palpitations, and tremor that have been present for over 5 years.  Reports having dizziness which she feels like she could pass out and shortness of breath when doing activities around the house needing to take more frequent breaks that have had been been getting worse.  Over the last few days she started having cramping pain she reports being behind her heart with radiation into her right shoulder and then all over.  She noted associated symptoms of tightness in her chest, nausea, vomiting, chronic leg swelling unchanged, and difficulty turning her neck as it causes pain in her shoulder.  She has been eating frequently, but relates that to increasing water take as her primary care provider had advised.  Denies having any abdominal pain, dysuria, or recent falls.  She reports taking medicines as advised and fill dates appear fairly consistent.  Patient has been seen in the ED on 10/19 with similar complaints but ultimately was discharged home without any medications.   Upon admission into the emergency department patient was noted to be afebrile in atrial fibrillation with heart rates into the 150s, and all other vital signs maintained.  Labs significant for WBC 13, BUN 13, creatinine creatinine 1.21, INR 3.2, troponin 15->16.  Chest x-ray  noted a large hiatal hernia without acute abnormality.  Patient has been given Tylenol, lidocaine patch, Zofran, GI cocktail, and started on diltiazem drip with bolus.  Review of Systems: As mentioned in the history of present illness. All other systems reviewed and are negative. Past Medical History:  Diagnosis Date   Anemia    history of   Anxiety    Arthritis    "left knee" (04/05/2017)   Atrial fibrillation (HCC)    Atrial flutter ([place])    typical appearing   Atrial tachycardia    ablated 11/17/10  by JA  from [facility] of the aorta   CHF (congestive heart failure) (HCC)    Chronic lower back pain    Chronic nausea    Endometrial cancer (HCC)    grade 1   Gallstone pancreatitis    Gastritis    GERD (gastroesophageal reflux disease)    History of hiatal hernia    HTN (hypertension)    Hyperlipemia    Hypothyroidism    Obesity    Osteoporosis    Toe ulcer ([place])    left 3rd toe   Vitamin D deficiency    Past Surgical History:  Procedure Laterality Date   ATRIAL ABLATION SURGERY  11/17/10   Atrial tachycardia arising from [facility] of the aorta ablated by Media  01/11/2011   Archie Endo 01/12/2011   CHOLECYSTECTOMY N/A 04/08/2017   Procedure: LAPAROSCOPIC CHOLECYSTECTOMY;  Surgeon: Georganna Skeans, MD;  Location: [facility];  Service: General;  Laterality: N/A;   FRACTURE SURGERY     HYSTEROSCOPY WITH  D & C N/A 08/05/2017   Procedure: DILATATION AND CURETTAGE /HYSTEROSCOPY AND POLYPECTOMY;  Surgeon: Osborne Oman, MD;  Location: [facility];  Service: Gynecology;  Laterality: N/A;   LUMBAR SPINE SURGERY  ~ [place] SALPINGO OOPHERECTOMY Bilateral 09/20/2017   Procedure: XI ROBOTIC ASSISTED TOTAL HYSTERECTOMY WITH BILATERAL SALPINGO OOPHORECTOMY, SENTINAL LYMPH NODE BIOPSY;  Surgeon: Everitt Amber, MD;  Location: [facility];  Service: Gynecology;  Laterality: Bilateral;   SHOULDER SURGERY Right 1984 X2   /ntoes  01/19/2011   WRIST FRACTURE SURGERY Left 1983   Social History:  reports that she has never smoked. She has never used smokeless tobacco. She reports that she does not drink alcohol and does not use drugs.  Allergies  Allergen Reactions   Iodinated Contrast Media Shortness Of Breath and Other (See Comments)    "Allergic," per [facility]- Causes headaches, also   Zosyn [Piperacillin Sod-Tazobactam So] Other (See Comments)    "Allergic," per [facility]   Penicillins Other (See Comments)    Tolerated Zosyn Oct 2018, but "Allergic," per [facility] Did it involve swelling of the face/tongue/throat, SOB, or low BP? Unk Did it involve sudden or severe rash/hives, skin peeling, or any reaction on the inside of your mouth or nose? Unk Did you need to seek medical attention at a hospital or doctor's office? Unk When did it last happen? Unk If all above answers are "NO", may proceed with cephalosporin use.     Family History  Problem Relation Age of Onset   Heart attack Father    Hypertension Father     Prior to Admission medications   Medication Sig Start Date End Date Taking? Authorizing Provider  lidocaine (LIDODERM) 5 % Place 1 patch onto the skin daily. Remove & Discard patch within 12 hours or as directed by MD 04/11/22  Yes Palumbo, April, MD  ondansetron (ZOFRAN-ODT) 8 MG disintegrating tablet '8mg'$  ODT q8 hours prn nausea 04/11/22  Yes Palumbo, April, MD  pantoprazole (PROTONIX) 20 MG tablet Take 1 tablet (20 mg total) by mouth daily. 04/11/22  Yes Palumbo, April, MD  acetaminophen (TYLENOL) 650 MG CR tablet Take 650 mg by mouth 2 (two) times daily.    [provider]  cholecalciferol (VITAMIN D3) 25 MCG (1000 UNIT) tablet Take 1,000 Units by mouth daily.    [provider]  gabapentin (NEURONTIN) 100 MG capsule TAKE ONE CAPSULE BY MOUTH TWICE DAILY 02/24/22   Isaac Bliss, Rayford Halsted, MD  levothyroxine (SYNTHROID) 88 MCG tablet TAKE ONE TABLET BY MOUTH EVERY DAY BEFORE BREAKFAST Patient  taking differently: Take 88 mcg by mouth daily before breakfast. 03/25/22   Isaac Bliss, Rayford Halsted, MD  metoprolol tartrate (LOPRESSOR) 50 MG tablet TAKE ONE TABLET TWICE DAILY WITH A MEAL Patient taking differently: Take 50 mg by mouth 2 (two) times daily. 09/25/21   Jettie Booze, MD  pantoprazole (PROTONIX) 40 MG tablet Take 1 tablet (40 mg total) by mouth daily for 14 days. 01/15/21 04/01/22  Antonieta Pert, MD  vitamin B-12 (CYANOCOBALAMIN) 1000 MCG tablet Take 5,000 mcg by mouth 2 (two) times daily.    [provider]  warfarin (COUMADIN) 5 MG tablet TAKE ONE-HALF TO ONE TABLET BY MOUTH DAILY AS DIRECTED BY COUMADIN CLINIC Patient taking differently: Take 2.5-5 mg by mouth See admin instructions. Take 5 mg (1 tablet) by mouth on Monday, Wednesday, and Friday's. Take 2.5 mg (1/2 tablet) on Sunday, Tuesday, Thursday, and Saturday's. 02/23/22   Larae Grooms  S, MD    Physical Exam: Vitals:   04/11/22 0600 04/11/22 0616 04/11/22 0655 04/11/22 0700  BP: (!) 140/92  (!) 168/95   Pulse: 65   (!) 125  Resp: 18   (!) 22  Temp: 98.3 F (36.8 C)     TempSrc: Oral     SpO2: 97%   99%  Weight:  88 kg    Height:  '5\' 9"'$  (1.753 m)     Exam  Constitutional: Elderly female currently in disc. Eyes: PERRL, lids and conjunctivae normal ENMT: Mucous membranes are moist.   Neck:   decreased range of motion.  No significant JVD Respiratory: Normal respiratory effort with some crackles noted in the lower lung fields but no significant wheezes or rhonchi.  Able to talk in fairly complete sentences. Cardiovascular: Heart rate regular. No evident lower extremity edema.   Abdomen: no tenderness, no masses palpated. No hepatosplenomegaly. Bowel sounds positive.  Musculoskeletal:   Significant crepitation noted on the knees.  Decreased range of motion with tenderness palpation of the spine and multiple other areas. Skin: Venous stasis changes noted of the lower extremities. Neurologic: CN  2-12 grossly intact.  Patient able to move all extremities.  Tremor present. Psychiatric: Normal judgment and insight. Alert and oriented x 3.  Anxious mood.   Data Reviewed:  EKG revealed concern for atrial flutter with variable block at 77 bpm.  Reviewed labs, imaging and pertinent records as noted above in HPI.  Assessment and Plan:  Chest pain Acute.  Patient presents with sharp left and right sided chest pain with reports of it radiating back pain with nausea and vomiting.  High-sensitivity negative x2.  EKG without any clear concerning features.  Question if symptoms are secondary to musculoskeletal/ arthritis pain and/or patient's large hiatal hernia. -Admit to a telemetry bed -Check echocardiogram -Cardiology consulted, will follow-up for any further recommendations  Permanent atrial fibrillation with RVR/atrial flutter on chronic anticoagulation supratherapeutic INR She was noted to be in atrial fibrillation/atrial flutter with heart rates elevated up to 150s.  She reports only taking metoprolol intermittently.  INR supratherapeutic at 3.2. patient was started on diltiazem drip.  -Check TSH -Goal potassium at least 4 and magnesium 2 -Continue Cardizem -Coumadin per pharmacy  Leukocytosis Acute.  WBC elevated at 13.  Unclear cause of patient's symptoms at this time.  Chest x-ray did not note any acute abnormality. -Check urinalysis -Recheck CBC tomorrow morning  Arthritis with multiple joint involvement  radiculopathy Patient reports having pain in her back, shoulders, neck, knees, and all over.  Review of prior imaging of the knees, right shoulder no significant arthritic changes.  MRI of the cervical spine 01/2021 noted multilevel disc degeneration and spine in the cervical spine with spinal and foraminal stenosis.  Patient was given lidocaine patch. Prior back surgey several years ago. -Continue gabapentin  -Tramadol as needed for pain -Check x-rays of the spine -OT/PT to  evaluate -TOC consulted for possible need of home health  Renal insufficiency chronic kidney disease stage IIIa Creatinine 1.21 with BUN 13 on admission.  Baseline creatinine previously noted to be around 0.98.  Not greater than 0.3 increased to suggest acute kidney injury.  Possibly secondary to hypoperfusion related to patient's elevated heart rates -Check urinalysis -Continue to monitor  Nausea and vomiting Patient reports having episodes of nausea and vomiting.  Unclear if related to pain.  Diastolic CHF Chronic.  She does not appear grossly fluid overloaded at this time.  Last EF noted to  be 60-65% with indeterminate diastolic parameters back in February. -Monitor intake and output -Daily weights  Essential hypertension Blood pressures currently maintained.  Home medication regimen includes metoprolol 50 mg twice daily. -Held metoprolol while on diltiazem drip  Urinary frequency Patient reported increased urinary frequency related to increased fluid intake. -Check UA  Hypothyroidism -Check TSH -Continue levothyroxine  Large hiatal hernia Chronic.  Chest x-ray notes unchanged large hiatal hernia. -Elevate head of the bed -Continue Protonix  DVT prophylaxis: Coumadin per pharmacy Advance Care Planning:   Code Status: Full Code   Consults: Cardiology  Family Communication: Updated over the phone  Severity of Illness: The appropriate patient status for this patient is OBSERVATION. Observation status is judged to be reasonable and necessary in order to provide the required intensity of service to ensure the patient's safety. The patient's presenting symptoms, physical exam findings, and initial radiographic and laboratory data in the context of their medical condition is felt to place them at decreased risk for further clinical deterioration. Furthermore, it is anticipated that the patient will be medically stable for discharge from the hospital within 2 midnights of  admission.   Author: Norval Morton, MD 04/11/2022 7:08 AM  For on call review [email or web address].

## 2022-04-11 NOTE — ED Provider Notes (Signed)
[facility] EMERGENCY DEPARTMENT Provider Note   CSN: [id number] Arrival date & time: 04/10/22  0755     History  Chief Complaint  Patient presents with   Back Pain   Chest Pain    Charlotte Castro is a 88 y.o. female.  The history is provided by the patient.  Illness Location:  Chest and neck Quality:  Pain Severity:  Moderate Onset quality:  Gradual Duration:  3 days Timing:  Constant Progression:  Unchanged Chronicity:  Recurrent Context:  Seen for similar last week.  INR is therapeutic.  continues to have pain but has also continued to lift weights Relieved by:  Nothing Worsened by:  Nothing Associated symptoms: chest pain, nausea and vomiting   Associated symptoms: no abdominal pain and no fever   Risk factors:  Elderly patient with large hiatal hernia and AFIB on coumadin Patient with atrial fibrillation with neck and chest pain with nausea and vomiting.      Past Medical History:  Diagnosis Date   Anemia    history of   Anxiety    Arthritis    "left knee" (04/05/2017)   Atrial fibrillation (HCC)    Atrial flutter ([place])    typical appearing   Atrial tachycardia    ablated 11/17/10  by JA  from the [facility] of the aorta   CHF (congestive heart failure) (HCC)    Chronic lower back pain    Chronic nausea    Endometrial cancer (HCC)    grade 1   Gallstone pancreatitis    Gastritis    GERD (gastroesophageal reflux disease)    History of hiatal hernia    HTN (hypertension)    Hyperlipemia    Hypothyroidism    Obesity    Osteoporosis    Toe ulcer ([place])    left 3rd toe   Vitamin D deficiency      Home Medications Prior to Admission medications   Medication Sig Start Date End Date Taking? Authorizing Provider  lidocaine (LIDODERM) 5 % Place 1 patch onto the skin daily. Remove & Discard patch within 12 hours or as directed by MD 04/11/22  Yes Rusell Meneely, MD  ondansetron (ZOFRAN-ODT) 8 MG disintegrating tablet '8mg'$  ODT q8 hours prn  nausea 04/11/22  Yes Shalla Bulluck, MD  pantoprazole (PROTONIX) 20 MG tablet Take 1 tablet (20 mg total) by mouth daily. 04/11/22  Yes Katriana Dortch, MD  acetaminophen (TYLENOL) 650 MG CR tablet Take 650 mg by mouth 2 (two) times daily.    [provider]  cholecalciferol (VITAMIN D3) 25 MCG (1000 UNIT) tablet Take 1,000 Units by mouth daily.    [provider]  gabapentin (NEURONTIN) 100 MG capsule TAKE ONE CAPSULE BY MOUTH TWICE DAILY 02/24/22   Isaac Bliss, Rayford Halsted, MD  levothyroxine (SYNTHROID) 88 MCG tablet TAKE ONE TABLET BY MOUTH EVERY DAY BEFORE BREAKFAST Patient taking differently: Take 88 mcg by mouth daily before breakfast. 03/25/22   Isaac Bliss, Rayford Halsted, MD  metoprolol tartrate (LOPRESSOR) 50 MG tablet TAKE ONE TABLET TWICE DAILY WITH A MEAL Patient taking differently: Take 50 mg by mouth 2 (two) times daily. 09/25/21   Jettie Booze, MD  pantoprazole (PROTONIX) 40 MG tablet Take 1 tablet (40 mg total) by mouth daily for 14 days. 01/15/21 04/01/22  Antonieta Pert, MD  vitamin B-12 (CYANOCOBALAMIN) 1000 MCG tablet Take 5,000 mcg by mouth 2 (two) times daily.    [provider]  warfarin (COUMADIN) 5 MG tablet TAKE ONE-HALF  TO ONE TABLET BY MOUTH DAILY AS DIRECTED BY COUMADIN CLINIC Patient taking differently: Take 2.5-5 mg by mouth See admin instructions. Take 5 mg (1 tablet) by mouth on Monday, Wednesday, and Friday's. Take 2.5 mg (1/2 tablet) on Sunday, Tuesday, Thursday, and Saturday's. 02/23/22   Jettie Booze, MD      Allergies    Iodinated contrast media, Zosyn [piperacillin sod-tazobactam so], and Penicillins    Review of Systems   Review of Systems  Constitutional:  Negative for diaphoresis and fever.  HENT:  Negative for facial swelling.   Eyes:  Negative for redness.  Respiratory:  Negative for stridor.   Cardiovascular:  Positive for chest pain.  Gastrointestinal:  Positive for nausea and vomiting. Negative for abdominal  pain.  Musculoskeletal:  Positive for neck pain.  All other systems reviewed and are negative.   Physical Exam Updated Vital Signs BP 119/73 (BP Location: Right Arm)   Pulse 62   Temp 97.8 F (36.6 C)   Resp 19   SpO2 97%  Physical Exam Vitals and nursing note reviewed.  Constitutional:      General: She is not in acute distress.    Appearance: Normal appearance. She is well-developed.  HENT:     Head: Normocephalic and atraumatic.     Nose: Nose normal.  Eyes:     Pupils: Pupils are equal, round, and reactive to light.  Cardiovascular:     Rate and Rhythm: Normal rate and regular rhythm.     Pulses: Normal pulses.     Heart sounds: Normal heart sounds.  Pulmonary:     Effort: Pulmonary effort is normal. No respiratory distress.     Breath sounds: Normal breath sounds.  Abdominal:     General: Bowel sounds are normal. There is no distension.     Palpations: Abdomen is soft.     Tenderness: There is no abdominal tenderness. There is no guarding or rebound.  Genitourinary:    Vagina: No vaginal discharge.  Musculoskeletal:        General: Normal range of motion.     Cervical back: Neck supple.  Skin:    General: Skin is warm and dry.     Capillary Refill: Capillary refill takes less than 2 seconds.     Findings: No erythema or rash.  Neurological:     General: No focal deficit present.     Mental Status: She is alert and oriented to person, place, and time.     Deep Tendon Reflexes: Reflexes normal.  Psychiatric:        Mood and Affect: Mood normal.        Behavior: Behavior normal.     ED Results / Procedures / Treatments   Labs (all labs ordered are listed, but only abnormal results are displayed) Results for orders placed or performed during the hospital encounter of 29/52/84  Basic metabolic panel  Result Value Ref Range   Sodium 138 135 - 145 mmol/L   Potassium 3.8 3.5 - 5.1 mmol/L   Chloride 105 98 - 111 mmol/L   CO2 23 22 - 32 mmol/L   Glucose, Bld  138 (H) 70 - 99 mg/dL   BUN 13 8 - 23 mg/dL   Creatinine, Ser 1.21 (H) 0.44 - 1.00 mg/dL   Calcium 9.0 8.9 - 10.3 mg/dL   GFR, Estimated 43 (L) >60 mL/min   Anion gap 10 5 - 15  CBC  Result Value Ref Range   WBC 13.0 (H) 4.0 -  10.5 K/uL   RBC 4.17 3.87 - 5.11 MIL/uL   Hemoglobin 13.9 12.0 - 15.0 g/dL   HCT 39.4 36.0 - 46.0 %   MCV 94.5 80.0 - 100.0 fL   MCH 33.3 26.0 - 34.0 pg   MCHC 35.3 30.0 - 36.0 g/dL   RDW 13.2 11.5 - 15.5 %   Platelets 217 150 - 400 K/uL   nRBC 0.0 0.0 - 0.2 %  Troponin I (High Sensitivity)  Result Value Ref Range   Troponin I (High Sensitivity) 16 <18 ng/L  Troponin I (High Sensitivity)  Result Value Ref Range   Troponin I (High Sensitivity) 15 <18 ng/L   DG Chest 2 View  Result Date: 04/10/2022 CLINICAL DATA:  Chest and back pain. EXAM: CHEST - 2 VIEW COMPARISON:  04/01/2022.  CT, 10/26/2017. FINDINGS: Cardiac silhouette is normal in size. Large hiatal hernia. No mediastinal or hilar masses. No evidence of adenopathy. Clear lungs.  No pleural effusion or pneumothorax. Skeletal structures are grossly intact. IMPRESSION: 1. No acute cardiopulmonary disease. 2. Large hiatal hernia, unchanged. Electronically Signed   By: Lajean Manes M.D.   On: 04/10/2022 09:20   DG Chest 2 View  Result Date: 04/01/2022 CLINICAL DATA:  Chest pain and shortness of breath. EXAM: CHEST - 2 VIEW COMPARISON:  Chest radiographs 01/13/2021, CT chest 10/25/2017 FINDINGS: An air-filled oval structure overlying the inferior medial right hemithorax corresponds to the large hiatal hernia/intrathoracic stomach seen on prior CT. Cardiac silhouette and mediastinal contours otherwise within normal limits. Mild chronic bilateral interstitial thickening is unchanged. No pleural effusion or pneumothorax. Mild multilevel degenerative disc changes of the thoracic spine. Cholecystectomy clips. IMPRESSION: 1. No acute cardiopulmonary process. 2. Large hiatal hernia/intrathoracic stomach, as seen on  multiple prior studies including prior CT. Electronically Signed   By: Yvonne Kendall M.D.   On: 04/01/2022 14:45     EKG  EKG Interpretation  Date/Time:  Saturday April 10 2022 08:05:02 EDT Ventricular Rate:  77 PR Interval:    QRS Duration: 74 QT Interval:  386 QTC Calculation: 436 R Axis:   40 Text Interpretation: Atrial flutter with variable A-V block Confirmed by Randal Buba, Jazzmen Restivo ([id number]) on 04/11/2022 6:05:12 AM         Radiology DG Chest 2 View  Result Date: 04/10/2022 CLINICAL DATA:  Chest and back pain. EXAM: CHEST - 2 VIEW COMPARISON:  04/01/2022.  CT, 10/26/2017. FINDINGS: Cardiac silhouette is normal in size. Large hiatal hernia. No mediastinal or hilar masses. No evidence of adenopathy. Clear lungs.  No pleural effusion or pneumothorax. Skeletal structures are grossly intact. IMPRESSION: 1. No acute cardiopulmonary disease. 2. Large hiatal hernia, unchanged. Electronically Signed   By: Lajean Manes M.D.   On: 04/10/2022 09:20    Procedures Procedures    Medications Ordered in ED Medications  lidocaine (LIDODERM) 5 % 3 patch (3 patches Transdermal Patch Applied 04/11/22 0603)  diltiazem (CARDIZEM) 1 mg/mL load via infusion 10 mg (has no administration in time range)    And  diltiazem (CARDIZEM) 125 mg in dextrose 5% 125 mL (1 mg/mL) infusion (has no administration in time range)  acetaminophen (TYLENOL) tablet 650 mg (650 mg Oral Given 04/10/22 1923)  ondansetron (ZOFRAN-ODT) disintegrating tablet 4 mg (4 mg Oral Given 04/10/22 1922)  ondansetron (ZOFRAN-ODT) disintegrating tablet 8 mg (8 mg Oral Given 04/11/22 0602)  alum & mag hydroxide-simeth (MAALOX/MYLANTA) 200-200-20 MG/5ML suspension 30 mL (30 mLs Oral Given 04/11/22 0604)  acetaminophen (TYLENOL) tablet 1,000 mg (1,000 mg Oral Given 04/11/22  [email or web address])     ED Course/ Medical Decision Making/ A&P                           Medical Decision Making Patient presents with pain in the neck and chest with nausea  and emesis.  Has continued to lift weights   Problems Addressed: Hiatal hernia:    Details: Will start protonix and refer to GI.  I suspect this is a component of patient's pain   Amount and/or Complexity of Data Reviewed Independent Historian:     Details: Son see above  External Data Reviewed: labs and notes.    Details: Previous ED notes and  Labs: ordered.    Details: All labs reviewed: troponins 16 and 15.  Normal sodium 138, normal sodium 3.8, elevated creatinine 1.21. elevated white count 13, normal hemoglobin 13.9, normal platelets 217, elevated INR 3.2  Radiology: ordered and independent interpretation performed.    Details: Hiatal hernia by me on CXR ECG/medicine tests: ordered and independent interpretation performed. Decision-making details documented in ED Course.  Risk OTC drugs. Prescription drug management. Decision regarding hospitalization. Risk Details: Patient flipped into AFIB with RVR will be started on diltiazem drip.  Some of the patient's pain appears to be radicular in nature with additional symptoms consistent with hiatal hernia    Critical Care Total time providing critical care: 30 minutes (Diltiazem drip )  CRITICAL CARE Performed by: Nakea Gouger K Rylann Munford-Rasch Total critical care time: 30 minutes Critical care time was exclusive of separately billable procedures and treating other patients. Critical care was necessary to treat or prevent imminent or life-threatening deterioration. Critical care was time spent personally by me on the following activities: development of treatment plan with patient and/or surrogate as well as nursing, discussions with consultants, evaluation of patient's response to treatment, examination of patient, obtaining history from patient or surrogate, ordering and performing treatments and interventions, ordering and review of laboratory studies, ordering and review of radiographic studies, pulse oximetry and re-evaluation of patient's  condition.  Final Clinical Impression(s) / ED Diagnoses Final diagnoses:  Hiatal hernia  Precordial pain      The patient appears reasonably stabilized for admission considering the current resources, flow, and capabilities available in the ED at this time, and I doubt any other [facility] requiring further screening and/or treatment in the ED prior to admission.    Otilio Groleau, MD 04/11/22 [phone]

## 2022-04-11 NOTE — Consult Note (Addendum)
Cardiology Consultation   Patient ID: Charlotte Castro MRN: [id number]; DOB: 18-Jan-1933  Admit date: 04/10/2022 Date of Consult: 04/11/2022  PCP:  Isaac Bliss, Rayford Halsted, MD   [place] Providers Cardiologist:  Larae Grooms, MD        Patient Profile:   Charlotte Castro is a 88 y.o. female with a hx of chest pain (normal cors by cath in 2012 and low-risk NST in 05/2017), paroxysmal atrial fibrillation/atrial tachycardia, HTN, HLD, hypothyroidism and history of endometrial cancer who is being seen 04/11/2022 for the evaluation of chest pain and atrial fibrillation at the request of Dr. Tamala Julian.  History of Present Illness:   [name] was last examined by Dr. Irish Lack in 06/2021 and reported worsening shoulder pain in the setting of arthritis but denied any recent chest pain. She did report chronic fatigue and dyspnea which was overall unchanged. Given her history of tachycardia, an echocardiogram was recommended to rule out a cardiomyopathy. This showed a preserved EF of 60 to 65% with no regional wall motion abnormalities. She did have mild LVH, normal RV function, normal PASP, mild MR and mild AI with no significant valve abnormalities.  She presented to [facility] ED on 04/01/2022 for evaluation of left-sided chest discomfort for the past 3 days along with worsening dyspnea. Hs troponin values were flat at 29 and 25 with BNP mildly elevated at 385. CXR showed a large hiatal hernia but no acute cardiopulmonary abnormalities. Her pain was reproducible on palpation and she was discharged home with plans to follow-up with Cardiology and her PCP as an outpatient.  She presented back to the ED on 04/10/2022 for evaluation of recurrent chest pain which would radiate into her back and neck. In talking with the patient today, she reports having chronic pain along her entire body in the setting of arthritis. When asked what was different that prompted her to seek evaluation, she  reports feeling a cramp-like sensation along her right and left pectoral regions which radiated into her back. Pain has been worse with deep breathing or when she feels her heart racing. Actually improves when in a supine position. She has baseline dyspnea on exertion with no acute changes. No specific orthopnea or PND. She does experience intermittent lower extremity edema but does not take a diuretic. Previously intolerant to compression stockings. She remains on Coumadin and reports good compliance with this. No bleeding issues. No recent travel.    Initial labs showed WBC 13.0, Hgb 13.9, platelets 217, Na+ 138, K+ 3.8 and creatinine 1.21 (baseline 0.9 - 1.0). Initial and repeat troponin values have been negative at 16 and 15. CXR shows no acute cardiopulmonary disease with a large hiatal hernia which is unchanged from prior imaging. EKG shows rate-controlled atrial flutter, heart rate 77 with prominent T waves which overall appear similar to prior tracings.  She was started on IV Cardizem at the time of admission but has converted back to NSR with HR currently in the 70's to 80's and frequent PAC's noted.   Past Medical History:  Diagnosis Date   Anemia    history of   Anxiety    Arthritis    "left knee" (04/05/2017)   Atrial fibrillation (HCC)    Atrial flutter ([place])    typical appearing   Atrial tachycardia    ablated 11/17/10  by JA  from the [facility] of the aorta   CHF (congestive heart failure) ([place])    Chronic lower back pain  Chronic nausea    Endometrial cancer (HCC)    grade 1   Gallstone pancreatitis    Gastritis    GERD (gastroesophageal reflux disease)    History of hiatal hernia    HTN (hypertension)    Hyperlipemia    Hypothyroidism    Obesity    Osteoporosis    Toe ulcer ([place])    left 3rd toe   Vitamin D deficiency     Past Surgical History:  Procedure Laterality Date   ATRIAL ABLATION SURGERY  11/17/10   Atrial tachycardia arising from [facility] of the aorta ablated by  [place] CATHETERIZATION  01/11/2011   Archie Endo 01/12/2011   CHOLECYSTECTOMY N/A 04/08/2017   Procedure: LAPAROSCOPIC CHOLECYSTECTOMY;  Surgeon: Georganna Skeans, MD;  Location: [facility];  Service: General;  Laterality: N/A;   FRACTURE SURGERY     HYSTEROSCOPY WITH D & C N/A 08/05/2017   Procedure: DILATATION AND CURETTAGE /HYSTEROSCOPY AND POLYPECTOMY;  Surgeon: Osborne Oman, MD;  Location: [facility];  Service: Gynecology;  Laterality: N/A;   LUMBAR SPINE SURGERY  ~ [place] SALPINGO OOPHERECTOMY Bilateral 09/20/2017   Procedure: XI ROBOTIC ASSISTED TOTAL HYSTERECTOMY WITH BILATERAL SALPINGO OOPHORECTOMY, SENTINAL LYMPH NODE BIOPSY;  Surgeon: Everitt Amber, MD;  Location: [facility];  Service: Gynecology;  Laterality: Bilateral;   SHOULDER SURGERY Right 1984 X2   /ntoes 01/19/2011   WRIST FRACTURE SURGERY Left 1983     Home Medications:  Prior to Admission medications   Medication Sig Start Date End Date Taking? Authorizing Provider  acetaminophen (TYLENOL) 650 MG CR tablet Take 650 mg by mouth 2 (two) times daily.   Yes [provider]  cholecalciferol (VITAMIN D3) 25 MCG (1000 UNIT) tablet Take 1,000 Units by mouth daily.   Yes [provider]  gabapentin (NEURONTIN) 100 MG capsule TAKE ONE CAPSULE BY MOUTH TWICE DAILY 02/24/22  Yes Isaac Bliss, Rayford Halsted, MD  levothyroxine (SYNTHROID) 88 MCG tablet TAKE ONE TABLET BY MOUTH EVERY DAY BEFORE BREAKFAST Patient taking differently: Take 88 mcg by mouth daily before breakfast. 03/25/22  Yes Isaac Bliss, Rayford Halsted, MD  lidocaine (LIDODERM) 5 % Place 1 patch onto the skin daily. Remove & Discard patch within 12 hours or as directed by MD 04/11/22  Yes Palumbo, April, MD  metoprolol tartrate (LOPRESSOR) 50 MG tablet TAKE ONE TABLET TWICE DAILY WITH A MEAL Patient taking differently: Take 50 mg by mouth 2 (two) times daily. 09/25/21  Yes Jettie Booze, MD   ondansetron (ZOFRAN-ODT) 8 MG disintegrating tablet '8mg'$  ODT q8 hours prn nausea 04/11/22  Yes Palumbo, April, MD  pantoprazole (PROTONIX) 20 MG tablet Take 1 tablet (20 mg total) by mouth daily. 04/11/22  Yes Palumbo, April, MD  vitamin B-12 (CYANOCOBALAMIN) 1000 MCG tablet Take 5,000 mcg by mouth 2 (two) times daily.   Yes [provider]  warfarin (COUMADIN) 5 MG tablet TAKE ONE-HALF TO ONE TABLET BY MOUTH DAILY AS DIRECTED BY COUMADIN CLINIC Patient taking differently: Take 2.5-5 mg by mouth See admin instructions. Take 5 mg (1 tablet) by mouth on Monday, Wednesday, and Friday's. Take 2.5 mg (1/2 tablet) on Sunday, Tuesday, Thursday, and Saturday's. 02/23/22  Yes Jettie Booze, MD  pantoprazole (PROTONIX) 40 MG tablet Take 1 tablet (40 mg total) by mouth daily for 14 days. 01/15/21 04/01/22  Antonieta Pert, MD    Inpatient Medications: Scheduled Meds:  gabapentin  100 mg Oral BID  levothyroxine  88 mcg Oral QAC breakfast   lidocaine  3 patch Transdermal Q24H   metoprolol tartrate  25 mg Oral BID   pantoprazole  40 mg Oral Daily   sodium chloride flush  3 mL Intravenous Q12H   traMADol  50 mg Oral Q6H   warfarin  2.5 mg Oral ONCE-1600   Warfarin - Pharmacist Dosing Inpatient   Does not apply q1600   Continuous Infusions:   PRN Meds: acetaminophen **OR** acetaminophen, albuterol, hydrALAZINE, ondansetron **OR** ondansetron (ZOFRAN) IV  Allergies:    Allergies  Allergen Reactions   Iodinated Contrast Media Shortness Of Breath and Other (See Comments)    "Allergic," per MAR- Causes headaches, also   Zosyn [Piperacillin Sod-Tazobactam So] Other (See Comments)    "Allergic," per [facility]   Penicillins Other (See Comments)    Tolerated Zosyn Oct 2018, but "Allergic," per [facility] Did it involve swelling of the face/tongue/throat, SOB, or low BP? Unk Did it involve sudden or severe rash/hives, skin peeling, or any reaction on the inside of your mouth or nose? Unk Did you need to  seek medical attention at a hospital or doctor's office? Unk When did it last happen? Unk If all above answers are "NO", may proceed with cephalosporin use.     Social History:   Social History   Socioeconomic History   Marital status: Widowed    Spouse name: Not on file   Number of children: 2   Years of education: 38   Highest education level: Not on file  Occupational History   Not on file  Tobacco Use   Smoking status: Never   Smokeless tobacco: Never  Vaping Use   Vaping Use: Never used  Substance and Sexual Activity   Alcohol use: No   Drug use: No   Sexual activity: Never    Birth control/protection: Post-menopausal  Other Topics Concern   Not on file  Social History Narrative   Lives alone in a one story home.  Has 2 children.  Retired.  Education: high school.     Social Determinants of Health   Financial Resource Strain: Not on file  Food Insecurity: No Food Insecurity (02/11/2022)   Hunger Vital Sign    Worried About Running Out of Food in the Last Year: Never true    Ran Out of Food in the Last Year: Never true  Transportation Needs: No Transportation Needs (02/11/2022)   PRAPARE - Hydrologist (Medical): No    Lack of Transportation (Non-Medical): No  Physical Activity: Not on file  Stress: Not on file  Social Connections: Not on file  Intimate Partner Violence: Not on file    Family History:    Family History  Problem Relation Age of Onset   Heart attack Father    Hypertension Father      ROS:  Please see the history of present illness.   All other ROS reviewed and negative.     Physical Exam/Data:   Vitals:   04/11/22 0915 04/11/22 0930 04/11/22 0945 04/11/22 1000  BP: (!) 121/48 (!) 118/48 (!) 114/52 (!) 129/46  Pulse: 90 91 73 78  Resp: '16 15 17 17  '$ Temp:      TempSrc:      SpO2: 96% 97% 97% 97%  Weight:      Height:       No intake or output data in the 24 hours ending 04/11/22 1155    04/11/2022  6:16 AM 04/01/2022    1:49 PM 06/26/2021    2:14 PM  Last 3 Weights  Weight (lbs) 194 lb 0.1 oz 194 lb 0.1 oz 196 lb  Weight (kg) 88 kg 88 kg 88.905 kg     Body mass index is 28.65 kg/m.  General: Pleasant elderly female appearing in no acute distress HEENT: normal Neck: no JVD Vascular: No carotid bruits; Distal pulses 2+ bilaterally Cardiac:  normal S1, S2; RRR with occasional ectopic beats.  Lungs:  clear to auscultation bilaterally, no wheezing, rhonchi or rales  Abd: soft, nontender, no hepatomegaly  Ext: Trace lower extremity edema Musculoskeletal:  No deformities, BUE and BLE strength normal and equal Skin: warm and dry  Neuro:  CNs 2-12 intact, no focal abnormalities noted Psych:  Normal affect   EKG:  The EKG was personally reviewed and demonstrates: Rate-controlled atrial flutter, HR 77 with prominent T waves which overall appear similar to prior tracings.  Relevant CV Studies:  NST: 05/2017 IMPRESSION: 1. No reversible ischemia or infarction.   2. Normal left ventricular wall motion.   3. Left ventricular ejection fraction 68%   4. Non invasive risk stratification*: Low  Echocardiogram: 07/2021 IMPRESSIONS     1. Left ventricular ejection fraction, by estimation, is 60 to 65%. The  left ventricle has normal function. The left ventricle has no regional  wall motion abnormalities. There is mild concentric left ventricular  hypertrophy. Left ventricular diastolic  parameters are indeterminate. The average left ventricular global  longitudinal strain is -15.9 %. The global longitudinal strain is normal.   2. Right ventricular systolic function is normal. The right ventricular  size is normal. There is normal pulmonary artery systolic pressure. The  estimated right ventricular systolic pressure is 29.5 mmHg.   3. Left atrial size was mildly dilated.   4. The mitral valve is normal in structure. Mild mitral valve  regurgitation. No evidence of mitral  stenosis.   5. The aortic valve is calcified. Aortic valve regurgitation is mild.  Aortic valve sclerosis/calcification is present, without any evidence of  aortic stenosis.   6. The inferior vena cava is normal in size with greater than 50%  respiratory variability, suggesting right atrial pressure of 3 mmHg.   Laboratory Data:  High Sensitivity Troponin:   Recent Labs  Lab 04/01/22 1412 04/01/22 1828 04/10/22 0819 04/10/22 1851  TROPONINIHS 29* 25* 16 15     Chemistry Recent Labs  Lab 04/10/22 0819  NA 138  K 3.8  CL 105  CO2 23  GLUCOSE 138*  BUN 13  CREATININE 1.21*  CALCIUM 9.0  GFRNONAA 43*  ANIONGAP 10    No results for input(s): "PROT", "ALBUMIN", "AST", "ALT", "ALKPHOS", "BILITOT" in the last 168 hours. Lipids No results for input(s): "CHOL", "TRIG", "HDL", "LABVLDL", "LDLCALC", "CHOLHDL" in the last 168 hours.  Hematology Recent Labs  Lab 04/10/22 0819  WBC 13.0*  RBC 4.17  HGB 13.9  HCT 39.4  MCV 94.5  MCH 33.3  MCHC 35.3  RDW 13.2  PLT 217   Thyroid No results for input(s): "TSH", "FREET4" in the last 168 hours.  BNPNo results for input(s): "BNP", "PROBNP" in the last 168 hours.  DDimer No results for input(s): "DDIMER" in the last 168 hours.   Radiology/Studies:  DG Lumbar Spine 2-3 Views  Result Date: 04/11/2022 CLINICAL DATA:  Back pain. EXAM: LUMBAR SPINE - 2-3 VIEW COMPARISON:  CT scan of the abdomen and pelvis August 07, 2020. Chest x-rays April 01, 2022  and April 10, 2022. FINDINGS: Disc spacer devices are identified at L4-5 and L5-S1. Anterolisthesis of L4 versus L5 and L5 versus S1 remains measuring 7 mm at the L4-5 level and 10 mm at the L5-S1 level. No other malalignment. There is suggested loss of height at the T9 level. In retrospect, this may be present on the thoracic spine films although this area is poorly evaluated on both studies. This finding was not present on the comparison CT scan or chest x-rays. Multilevel  degenerative disc disease most marked at L2-3. Lower lumbar facet degenerative changes. Calcified atherosclerotic change in the abdominal aorta. IMPRESSION: 1. The T9 level is poorly evaluated on both today's thoracic spine and lumbar spine films. However, there is suspected loss of height not identified on previous studies. Recommend a CT scan of the thoracic spine for better evaluation. 2. Disc spacer devices as above. Anterolisthesis of L4 versus L5 and L5 versus S1 is above. Degenerative changes. Electronically Signed   By: Dorise Bullion III M.D.   On: 04/11/2022 11:42   DG Cervical Spine 2-3Vclearing  Result Date: 04/11/2022 CLINICAL DATA:  Pain. EXAM: LIMITED CERVICAL SPINE FOR TRAUMA CLEARING - 2-3 VIEW COMPARISON:  None Available. FINDINGS: The cervical spine is only well seen through the C6 level on lateral imaging. C7 and T1 are largely obscured by overlapping soft tissues. 3 mm anterolisthesis of C3 versus C4 is unchanged since the January 14, 2021 MRI. There is straightening of normal lordosis. No other malalignment identified. No fractures. Degenerative disc disease is most marked at C4-5 and C5-6. Multilevel uncovertebral degenerative changes identified. The pre odontoid space and prevertebral soft tissues are normal. Lung apices are unremarkable. IMPRESSION: 1. The cervical spine is only well seen through the C6 level on lateral imaging. C7 and T1 are largely obscured by overlapping soft tissues. C7 and T1 could be better assessed with CT imaging if there is concern at these levels. 2. 3 mm anterolisthesis of C3 versus C4 is unchanged. 3. Degenerative disc disease and uncovertebral degenerative changes as above. Electronically Signed   By: Dorise Bullion III M.D.   On: 04/11/2022 11:36   DG Thoracic Spine 2 View  Result Date: 04/11/2022 CLINICAL DATA:  Back pain.  Leg pain. EXAM: THORACIC SPINE 2 VIEWS COMPARISON:  Chest x-ray April 10, 2022 FINDINGS: A large hiatal hernia is again  identified. The heart, hila, mediastinum, lungs, and pleura are otherwise unremarkable. The lateral view is limited due to poor penetration. Mild anterior wedging of a midthoracic vertebral body is unchanged since April 01, 2022 chest x-ray imaging. IMPRESSION: The lateral view is markedly limited due to lack of penetration. Anterior wedging of a midthoracic vertebral body appears to be stable. If there is concern for fracture, recommend CT imaging for better evaluation. Electronically Signed   By: Dorise Bullion III M.D.   On: 04/11/2022 11:33   DG Chest 2 View  Result Date: 04/10/2022 CLINICAL DATA:  Chest and back pain. EXAM: CHEST - 2 VIEW COMPARISON:  04/01/2022.  CT, 10/26/2017. FINDINGS: Cardiac silhouette is normal in size. Large hiatal hernia. No mediastinal or hilar masses. No evidence of adenopathy. Clear lungs.  No pleural effusion or pneumothorax. Skeletal structures are grossly intact. IMPRESSION: 1. No acute cardiopulmonary disease. 2. Large hiatal hernia, unchanged. Electronically Signed   By: Lajean Manes M.D.   On: 04/10/2022 09:20     Assessment and Plan:   1. Chest Pain with Atypical Features - She reports a cramping sensation along her  chest which varies from the right to left side and is exacerbated with deep breathing or her palpitations. However, she did not notice a changes in symptoms when in atrial flutter vs. NSR.  - Hs Troponin values have been negative at 16 and 15. EKG shows peaked T-waves but no acute changes. Echocardiogram is pending to assess for any structural abnormalities.  - Her INR has been therapeutic for the past few months which makes a PE less likely. Symptoms possibly due to a hiatal hernia but no association with food consumption. Will restart PPI therapy with Protonix '40mg'$  daily. Pending echo results, can consider additional ischemic evaluation but at this time her symptoms seem atypical for angina and would focus on medical management.    2.  Paroxysmal Atrial Fibrillation/Flutter - She was initially in atrial flutter but did convert back to NSR this AM. She did not notice any change in symptoms with return to NSR. Would stop IV Cardizem and switch to Lopressor '25mg'$  BID (listed as being on '50mg'$  BID prior to admission but it is unclear if she was taking this routinely).  - She remains on Coumadin for anticoagulation and dosing is being managed by pharmacy.   3. HTN - BP has been variable from 102/56 - 168/95 while in the ED, at 129/46 on most recent check. Will stop IV Cardizem as outlined above and switch to Lopressor.   4. Stage 3 CKD - Baseline creatinine on 0.9 - 1.0.  At 1.21 on admission. Continue to follow.     Risk Assessment/Risk Scores:        CHA2DS2-VASc Score = 5   This indicates a 7.2% annual risk of stroke. The patient's score is based upon: CHF History: 1 HTN History: 1 Diabetes History: 0 Stroke History: 0 Vascular Disease History: 0 Age Score: 2 Gender Score: 1   For questions or updates, please contact [place] Please consult [email or web address] for contact info under    Signed, Erma Heritage, PA-C  04/11/2022 11:55 AM  Atypical chest pain-recurrent-negative Myoview 2021  Atrial fibrillation-paroxysmal  Tachypalpitations  Hiatal hernia  Dyspnea-chronic   Patient has atypical chest pain symptoms which include diffuse myalgias in the setting of recent cold and sore throat (her history is variable over time).  Chronic dyspnea which she says is not aggravated.  A pleuritic component would not likely be from a pulmonary embolism given her therapeutic INR and I suspect is more likely related as noted below to aviral illness  Right according to the PPI  Some tachy palpitations which might or might not be related to the atrial fibrillation-RVR as she was complaining of symptoms when seen by BS-PA even when she was in sinus rhythm-but I suspect that this is real we will continue her  beta-blocker.  Metoprolol 25 twice daily and this might be able to be uptitrated as blood pressure allows  I think this is a low likelihood to be coronary disease; however, given the fact that this is the second visit to the emergency room in a week, would favor Myoview scanning prior to discharge guide future  With the myalgias and the sore throat etc., I also wonder whether this is not just an acute viral illness and have ordered a COVID test

## 2022-04-11 NOTE — Progress Notes (Signed)
ANTICOAGULATION CONSULT NOTE - Initial Consult  Pharmacy Consult for warfarin Indication: atrial fibrillation  Allergies  Allergen Reactions   Iodinated Contrast Media Shortness Of Breath and Other (See Comments)    "Allergic," per MAR- Causes headaches, also   Zosyn [Piperacillin Sod-Tazobactam So] Other (See Comments)    "Allergic," per [facility]   Penicillins Other (See Comments)    Tolerated Zosyn Oct 2018, but "Allergic," per [facility] Did it involve swelling of the face/tongue/throat, SOB, or low BP? Unk Did it involve sudden or severe rash/hives, skin peeling, or any reaction on the inside of your mouth or nose? Unk Did you need to seek medical attention at a hospital or doctor's office? Unk When did it last happen? Unk If all above answers are "NO", may proceed with cephalosporin use.     Patient Measurements: Height: '5\' 9"'$  (175.3 cm) Weight: 88 kg (194 lb 0.1 oz) IBW/kg (Calculated) : 66.2   Vital Signs: Temp: 97.9 F (36.6 C) (10/29 0841) Temp Source: Oral (10/29 0600) BP: 129/46 (10/29 1000) Pulse Rate: 78 (10/29 1000)  Labs: Recent Labs    04/10/22 0819 04/10/22 1851 04/11/22 0607  HGB 13.9  --   --   HCT 39.4  --   --   PLT 217  --   --   LABPROT  --   --  32.4*  INR  --   --  3.2*  CREATININE 1.21*  --   --   TROPONINIHS 16 15  --     Estimated Creatinine Clearance: 38.7 mL/min (A) (by C-G formula based on SCr of 1.21 mg/dL (H)).   Medical History: Past Medical History:  Diagnosis Date   Anemia    history of   Anxiety    Arthritis    "left knee" (04/05/2017)   Atrial fibrillation (HCC)    Atrial flutter ([place])    typical appearing   Atrial tachycardia    ablated 11/17/10  by JA  from the [facility] of the aorta   CHF (congestive heart failure) (HCC)    Chronic lower back pain    Chronic nausea    Endometrial cancer (HCC)    grade 1   Gallstone pancreatitis    Gastritis    GERD (gastroesophageal reflux disease)    History of hiatal hernia    HTN  (hypertension)    Hyperlipemia    Hypothyroidism    Obesity    Osteoporosis    Toe ulcer ([place])    left 3rd toe   Vitamin D deficiency      Assessment: 47 YOF presenting with CP, in afib RVR, hx of afib on warfarin PTA, INR on admission is slightly supratherapeutic at 3.2, however last dose 10/27  PTA dosing: '5mg'$  MWF, 2.5 mg all other days  Goal of Therapy:  INR 2-3 Monitor platelets by anticoagulation protocol: Yes   Plan:  Warfarin 2.'5mg'$  PO x 1 today with missed dose yesterday Daily INR, s/s bleeding  Bertis Ruddy, PharmD Clinical Pharmacist ED Pharmacist Phone # [phone] 04/11/2022 11:11 AM

## 2022-04-11 NOTE — Care Management Obs Status (Signed)
[facility] NOTIFICATION   Patient Details  Name: Charlotte Castro MRN: [id number] Date of Birth: [birth year]   Medicare Observation Status Notification Given:  Yes    Meredyth Hornung G., RN 04/11/2022, 3:47 PM

## 2022-04-12 ENCOUNTER — Telehealth: Payer: Self-pay | Admitting: Internal Medicine

## 2022-04-12 ENCOUNTER — Observation Stay (HOSPITAL_COMMUNITY): Payer: Medicare Other

## 2022-04-12 DIAGNOSIS — I10 Essential (primary) hypertension: Secondary | ICD-10-CM | POA: Diagnosis present

## 2022-04-12 DIAGNOSIS — E669 Obesity, unspecified: Secondary | ICD-10-CM | POA: Diagnosis present

## 2022-04-12 DIAGNOSIS — M5410 Radiculopathy, site unspecified: Secondary | ICD-10-CM | POA: Diagnosis present

## 2022-04-12 DIAGNOSIS — M129 Arthropathy, unspecified: Secondary | ICD-10-CM | POA: Diagnosis not present

## 2022-04-12 DIAGNOSIS — R079 Chest pain, unspecified: Secondary | ICD-10-CM

## 2022-04-12 DIAGNOSIS — I5032 Chronic diastolic (congestive) heart failure: Secondary | ICD-10-CM | POA: Diagnosis present

## 2022-04-12 DIAGNOSIS — N1831 Chronic kidney disease, stage 3a: Secondary | ICD-10-CM | POA: Diagnosis present

## 2022-04-12 DIAGNOSIS — I4821 Permanent atrial fibrillation: Secondary | ICD-10-CM | POA: Diagnosis present

## 2022-04-12 DIAGNOSIS — G8929 Other chronic pain: Secondary | ICD-10-CM | POA: Diagnosis present

## 2022-04-12 DIAGNOSIS — E1122 Type 2 diabetes mellitus with diabetic chronic kidney disease: Secondary | ICD-10-CM | POA: Diagnosis present

## 2022-04-12 DIAGNOSIS — R350 Frequency of micturition: Secondary | ICD-10-CM | POA: Diagnosis present

## 2022-04-12 DIAGNOSIS — K449 Diaphragmatic hernia without obstruction or gangrene: Secondary | ICD-10-CM | POA: Diagnosis present

## 2022-04-12 DIAGNOSIS — R0789 Other chest pain: Secondary | ICD-10-CM | POA: Diagnosis not present

## 2022-04-12 DIAGNOSIS — I4892 Unspecified atrial flutter: Secondary | ICD-10-CM | POA: Diagnosis present

## 2022-04-12 DIAGNOSIS — R791 Abnormal coagulation profile: Secondary | ICD-10-CM | POA: Diagnosis present

## 2022-04-12 DIAGNOSIS — Z7901 Long term (current) use of anticoagulants: Secondary | ICD-10-CM | POA: Diagnosis not present

## 2022-04-12 DIAGNOSIS — E559 Vitamin D deficiency, unspecified: Secondary | ICD-10-CM | POA: Diagnosis present

## 2022-04-12 DIAGNOSIS — I130 Hypertensive heart and chronic kidney disease with heart failure and stage 1 through stage 4 chronic kidney disease, or unspecified chronic kidney disease: Secondary | ICD-10-CM | POA: Diagnosis present

## 2022-04-12 DIAGNOSIS — I951 Orthostatic hypotension: Secondary | ICD-10-CM | POA: Diagnosis present

## 2022-04-12 DIAGNOSIS — Z1152 Encounter for screening for COVID-19: Secondary | ICD-10-CM | POA: Diagnosis not present

## 2022-04-12 DIAGNOSIS — R072 Precordial pain: Secondary | ICD-10-CM | POA: Diagnosis present

## 2022-04-12 DIAGNOSIS — N179 Acute kidney failure, unspecified: Secondary | ICD-10-CM | POA: Diagnosis present

## 2022-04-12 DIAGNOSIS — I480 Paroxysmal atrial fibrillation: Secondary | ICD-10-CM | POA: Diagnosis not present

## 2022-04-12 DIAGNOSIS — M545 Low back pain, unspecified: Secondary | ICD-10-CM | POA: Diagnosis present

## 2022-04-12 DIAGNOSIS — M1712 Unilateral primary osteoarthritis, left knee: Secondary | ICD-10-CM | POA: Diagnosis present

## 2022-04-12 DIAGNOSIS — K219 Gastro-esophageal reflux disease without esophagitis: Secondary | ICD-10-CM | POA: Diagnosis present

## 2022-04-12 DIAGNOSIS — N289 Disorder of kidney and ureter, unspecified: Secondary | ICD-10-CM | POA: Diagnosis not present

## 2022-04-12 DIAGNOSIS — M810 Age-related osteoporosis without current pathological fracture: Secondary | ICD-10-CM | POA: Diagnosis present

## 2022-04-12 DIAGNOSIS — E039 Hypothyroidism, unspecified: Secondary | ICD-10-CM | POA: Diagnosis present

## 2022-04-12 DIAGNOSIS — F419 Anxiety disorder, unspecified: Secondary | ICD-10-CM | POA: Diagnosis present

## 2022-04-12 DIAGNOSIS — E785 Hyperlipidemia, unspecified: Secondary | ICD-10-CM | POA: Diagnosis present

## 2022-04-12 DIAGNOSIS — R5382 Chronic fatigue, unspecified: Secondary | ICD-10-CM | POA: Diagnosis present

## 2022-04-12 LAB — CBC
HCT: 33.8 % — ABNORMAL LOW (ref 36.0–46.0)
Hemoglobin: 11.9 g/dL — ABNORMAL LOW (ref 12.0–15.0)
MCH: 33.2 pg (ref 26.0–34.0)
MCHC: 35.2 g/dL (ref 30.0–36.0)
MCV: 94.4 fL (ref 80.0–100.0)
Platelets: 183 10*3/uL (ref 150–400)
RBC: 3.58 MIL/uL — ABNORMAL LOW (ref 3.87–5.11)
RDW: 13.3 % (ref 11.5–15.5)
WBC: 10.3 10*3/uL (ref 4.0–10.5)
nRBC: 0 % (ref 0.0–0.2)

## 2022-04-12 LAB — URINALYSIS, ROUTINE W REFLEX MICROSCOPIC
Bilirubin Urine: NEGATIVE
Glucose, UA: NEGATIVE mg/dL
Ketones, ur: NEGATIVE mg/dL
Nitrite: POSITIVE — AB
Protein, ur: NEGATIVE mg/dL
Specific Gravity, Urine: 1.02 (ref 1.005–1.030)
WBC, UA: >50 WBC/hpf — ABNORMAL HIGH (ref 0–5)
pH: 5 (ref 5.0–8.0)

## 2022-04-12 LAB — BASIC METABOLIC PANEL
Anion gap: 7 (ref 5–15)
BUN: 38 mg/dL — ABNORMAL HIGH (ref 8–23)
CO2: 24 mmol/L (ref 22–32)
Calcium: 9 mg/dL (ref 8.9–10.3)
Chloride: 106 mmol/L (ref 98–111)
Creatinine, Ser: 2.14 mg/dL — ABNORMAL HIGH (ref 0.44–1.00)
GFR, Estimated: 22 mL/min — ABNORMAL LOW (ref >60)
Glucose, Bld: 109 mg/dL — ABNORMAL HIGH (ref 70–99)
Potassium: 4.7 mmol/L (ref 3.5–5.1)
Sodium: 137 mmol/L (ref 135–145)

## 2022-04-12 LAB — PROTIME-INR
INR: 4 — ABNORMAL HIGH (ref 0.8–1.2)
Prothrombin Time: 38.7 seconds — ABNORMAL HIGH (ref 11.4–15.2)

## 2022-04-12 LAB — ECHOCARDIOGRAM COMPLETE
Area-P 1/2: 4.39 cm2
Calc EF: 64.8 %
Height: 69 in
S' Lateral: 2.8 cm
Single Plane A2C EF: 68.2 %
Single Plane A4C EF: 60.9 %
Weight: 3104.08 oz

## 2022-04-12 MED ORDER — SODIUM CHLORIDE 0.9 % IV SOLN: INTRAVENOUS | Status: AC

## 2022-04-12 MED ORDER — PERFLUTREN LIPID MICROSPHERE
1.0000 mL | INTRAVENOUS | Status: AC | PRN
  Administered 2022-04-12: 2 mL via INTRAVENOUS

## 2022-04-12 NOTE — Progress Notes (Signed)
OT Cancellation Note  Patient Details Name: CORNIE MCCOMBER MRN: [id number] DOB: 04/15/1933   Cancelled Treatment:    Reason Eval/Treat Not Completed: Patient at procedure or test/ unavailable (ECHO in room upon arrival, OT to f/u when able.)  Elliot Cousin 04/12/2022, 8:41 AM

## 2022-04-12 NOTE — Consult Note (Signed)
   [facility] CM Inpatient Consult   04/12/2022  Charlotte Castro 22-Jun-1932 [id number]  [facility] [ACO] Patient: Medicare ACO REACH  Primary Care Provider:  Isaac Bliss, Rayford Halsted, MD with [place] [facility]  Patient has been active with [place] teams   Referral request:  Potential for SNF Waiver from inpatient [facility] LCSW via secure chat  If the patient goes to a [facility] affiliated facility then, patient can be followed by [facility] [facility] RN with traditional Medicare.   Plan:   [facility] PAC RN can follow for any known or needs for transitional care needs for returning to post facility care or complex disease management.  For questions or referrals, please contact:  Natividad Brood, RN BSN [place]  [phone] business mobile phone Toll free office [phone]  *[place]  [phone] Fax number: [phone] [email or web address]

## 2022-04-12 NOTE — Progress Notes (Signed)
Echocardiogram 2D Echocardiogram has been performed.  Charlotte Castro 04/12/2022, 9:29 AM

## 2022-04-12 NOTE — TOC Initial Note (Addendum)
Transition of Care [facility]) - Initial/Assessment Note    Patient Details  Name: Charlotte Castro MRN: [id number] Date of Birth: Dec 18, 1932  Transition of [facility]) CM/SW Contact:    Milas Gain, [place] Phone Number: 04/12/2022, 1:46 PM  Clinical Narrative:                  CSW received consult for possible SNF placement at time of discharge. CSW spoke with patient at bedside regarding PT recommendation of SNF placement at time of discharge. Patient reports she comes from home alone.Patient expressed understanding of PT recommendation and is agreeable to SNF placement at time of discharge. Patient gave CSW permission to fax out initial referral near the [facility] area to the [facility] waiver approved facilities. CSW discussed insurance authorization process with patient. Patient reports she has not received the COVID vaccines.  No further questions reported at this time. CSW to continue to follow and assist with discharge planning needs.   Expected Discharge Plan: Skilled Nursing Facility Barriers to Discharge: Continued Medical Work up   Patient Goals and CMS Choice Patient states their goals for this hospitalization and ongoing recovery are:: SNF CMS Medicare.gov Compare Post Acute Care list provided to:: Patient Choice offered to / list presented to : Patient  Expected Discharge Plan and Services Expected Discharge Plan: [place] In-house Referral: Clinical Social Work     Living arrangements for the past 2 months: [place]                                      Prior Living Arrangements/Services Living arrangements for the past 2 months: Single Family Home Lives with:: Self Patient language and need for interpreter reviewed:: Yes Do you feel safe going back to the place where you live?: No   SNF  Need for Family Participation in Patient Care: Yes (Comment) Care giver support system in place?: Yes (comment)   Criminal Activity/Legal  Involvement Pertinent to Current Situation/Hospitalization: No - Comment as needed  Activities of Daily Living      Permission Sought/Granted Permission sought to share information with : Case Manager, Family Supports, Customer service manager Permission granted to share information with : Yes, Verbal Permission Granted  Share Information with NAME: Tammie  Permission granted to share info w AGENCY: SNF  Permission granted to share info w Relationship: daughter  Permission granted to share info w Contact Information: Tammie [phone]  Emotional Assessment Appearance:: Appears stated age Attitude/Demeanor/Rapport: Gracious Affect (typically observed): Calm Orientation: : Oriented to Self, Oriented to Place, Oriented to  Time, Oriented to Situation Alcohol / Substance Use: Not Applicable Psych Involvement: No (comment)  Admission diagnosis:  Precordial pain [R07.2] Hiatal hernia [K44.9] Chest pain [R07.9] Atrial fibrillation, unspecified type ([place]) [I48.91] Orthostatic hypertension [I10] Patient Active Problem List   Diagnosis Date Noted   Orthostatic hypertension 04/12/2022   Renal insufficiency 04/11/2022   Arthritis, multiple joint involvement 04/11/2022   Urinary frequency 04/11/2022   Supratherapeutic INR 04/11/2022   Radiculopathy 04/11/2022   Leukocytosis 04/11/2022   Chest pain 01/13/2021   Orthostasis 01/13/2021   Abdominal pain    Acute cystitis without hematuria    Hiatal hernia 01/29/2019   AKI (acute kidney injury) ([place]) 01/14/2019   B12 deficiency 08/08/2018   Hypothyroidism 08/08/2018   Endometrial cancer ([place]) 08/05/2017   Toe ulcer, left, with unspecified severity ([place]) 08/04/2017   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)  04/10/2016   Nausea and vomiting 10/09/2015   Essential hypertension, benign 01/18/2014   Long term current use of anticoagulant therapy 01/18/2014   Permanent atrial fibrillation ([place]) 06/10/2011   PCP:  Isaac Bliss, Rayford Halsted, MD Pharmacy:   [place], [place] - [address] [place] [address] Phone: [phone] Fax: [phone]     Social Determinants of Health (SDOH) Interventions    Readmission Risk Interventions     No data to display

## 2022-04-12 NOTE — Patient Instructions (Signed)
Visit Information  Thank you for taking time to visit with me today. Please don't hesitate to contact me if I can be of assistance to you.   Following are the goals we discussed today:   Goals Addressed             This Visit's Progress    COMPLETED: LCSW Care Coordination Activities-No Follow Up Required       Care Coordination Interventions: Active listening / Reflection utilized  Emotional Support Provided Pt reports that her chest pain has decreased but transferred to other side of chest. She endorses "sharp" shoulder pain LCSW completed SDOH screenings. Pt's son provides transportation to medical appts. Pt reports stable housing Pt reports having a strong support system to assist her with management of health conditions LCSW informed patient of care coordination services. Pt is not interested at this time and agreed to contact PCP, should needs arise          If you are experiencing a Mental Health or [place] or need someone to talk to, please call the Suicide and Crisis Lifeline: 988 call 911   Patient verbalizes understanding of instructions and care plan provided today and agrees to view in [place]. Active MyChart status and patient understanding of how to access instructions and care plan via MyChart confirmed with patient.     No further follow up required:    Charlotte Castro, MSW, [email or web address] Phone [phone] 4:14 PM

## 2022-04-12 NOTE — Telephone Encounter (Signed)
Pt daughter tammy is calling to cancel appt for tomorrow and she is at [facility] hospital and once d/c she will be going to rehab possibly due to her back

## 2022-04-12 NOTE — Evaluation (Signed)
Physical Therapy Evaluation Patient Details Name: Charlotte Castro MRN: [id number] DOB: 01-18-1933 Today's Date: 04/12/2022  History of Present Illness  88 y.o. female who presents with complaints of back and chest pain 10/28 Found to have paroxysmal A fib-A flutter rate in 150s. PMH: hypertension, hyperlipidemia, atrial fibrillation/flutter on Coumadin, CHF, hypothyroidism, anxiety, osteoarthritis, chronic nausea, GERD  Clinical Impression  PTA pt living in single story home with ramped entrance. Pt reports ambulation with quad cane in home, uses BSC due to increased falls, takes bird baths, limited cooking and cleaning, son takes her to doctors appointments, and daughter goes grocery shopping 2x/week. Pt limited in safe mobility by increased pain in L shoulder, neck, back, R knee, bilateral feet and chest with movement. Pt is maxA to come to [facility] and total A to return. Pt found to have orthostatic hypotension so further mobility deferred. PT recommending SNF level rehab. PT will continue to follow acutely.      Recommendations for follow up therapy are one component of a multi-disciplinary discharge planning process, led by the attending physician.  Recommendations may be updated based on patient status, additional functional criteria and insurance authorization.  Follow Up Recommendations Skilled nursing-short term rehab (<3 hours/day) Can patient physically be transported by private vehicle: No    Assistance Recommended at Discharge Frequent or constant Supervision/Assistance  Patient can return home with the following  Two people to help with walking and/or transfers;A lot of help with bathing/dressing/bathroom;Assistance with cooking/housework;Direct supervision/assist for medications management;Direct supervision/assist for financial management;Assist for transportation;Help with stairs or ramp for entrance    Equipment Recommendations None recommended by PT     Functional Status  Assessment Patient has had a recent decline in their functional status and demonstrates the ability to make significant improvements in function in a reasonable and predictable amount of time.     Precautions / Restrictions Precautions Precautions: Fall Precaution Comments: numerous recent falls Restrictions Weight Bearing Restrictions: No      Mobility  Bed Mobility Overal bed mobility: Needs Assistance Bed Mobility: Supine to Sit, Sit to Supine     Supine to sit: HOB elevated, Max assist Sit to supine: Total assist   General bed mobility comments: maxA for management of LE off bed and bringing trunk to upright, pt with complaints of dizziness with sitting, found to be orthostatic (recorded in vitals flowsheet), requires total A to return to bed                       Balance Overall balance assessment: Needs assistance Sitting-balance support: Feet supported, Single extremity supported, Bilateral upper extremity supported, No upper extremity supported Sitting balance-[name]: Fair Sitting balance - Comments: able to sit EOB for BP measurements                                     Pertinent Vitals/Pain Pain Assessment Pain Assessment: Faces Faces Pain Scale: Hurts whole lot Pain Location: chest with movement, L shoulder blade up to neck, R knee, screams in pain however also stops screaming to ask for her gown to be tied in the back Pain Descriptors / Indicators: Grimacing, Guarding Pain Intervention(s): Limited activity within patient's tolerance, Monitored during session, Repositioned    Home Living Family/patient expects to be discharged to:: Private residence Living Arrangements: Alone Available Help at Discharge: Family;Available PRN/intermittently (son in [place], daughter in [place]) Type of Home: [place]  Access: Ramped entrance       Home Layout: One level Home Equipment: Rollator (4 wheels);Cane - quad;Cane - single  point;BSC/3in1;Wheelchair - manual      Prior Function               Mobility Comments: using quad cane for household ambulation, son drives her to appointments, ADLs Comments: taking bird baths, attempts cooking and cleaning, daughter does grocery shopping     Hand Dominance   Dominant Hand: Right    Extremity/Trunk Assessment   Upper Extremity Assessment Upper Extremity Assessment: Defer to OT evaluation    Lower Extremity Assessment Lower Extremity Assessment: RLE deficits/detail;LLE deficits/detail RLE Deficits / Details: R knee swollen, only tolerates about 45 degrees of AAROM before screaming, screams with gentle AAROM of ankle RLE: Unable to fully assess due to pain RLE Sensation: decreased light touch LLE Deficits / Details: L knee > AAROM than L however also screams LLE Sensation: decreased light touch    Cervical / Trunk Assessment Cervical / Trunk Assessment: Kyphotic  Communication   Communication: No difficulties  Cognition Arousal/Alertness: Awake/alert Behavior During Therapy: Anxious Overall Cognitive Status: Impaired/Different from baseline Area of Impairment: Following commands, Safety/judgement, Awareness, Problem solving                       Following Commands: Follows one step commands inconsistently, Follows multi-step commands inconsistently Safety/Judgement: Decreased awareness of safety Awareness: Emergent Problem Solving: Difficulty sequencing, Requires verbal cues, Requires tactile cues General Comments: pt tangential and does not answer questions directly, internally distracted        General Comments General comments (skin integrity, edema, etc.): orthostatic hypotension results in flowsheet, R knee edema, SpO2 on 2 L O2 via [place] >96%O2        Assessment/Plan    PT Assessment Patient needs continued PT services  PT Problem List Decreased strength;Decreased range of motion;Decreased activity tolerance;Decreased  balance;Decreased mobility;Decreased coordination;Decreased cognition;Cardiopulmonary status limiting activity;Impaired sensation;Pain       PT Treatment Interventions DME instruction;Gait training;Functional mobility training;Therapeutic activities;Therapeutic exercise;Balance training;Cognitive remediation;Patient/family education    PT Goals (Current goals can be found in the Care Plan section)  Acute Rehab PT Goals Patient Stated Goal: go home PT Goal Formulation: With patient Time For Goal Achievement: 04/26/22 Potential to Achieve Goals: Fair    Frequency Min 2X/week        AM-PAC PT "6 Clicks" Mobility  Outcome Measure Help needed turning from your back to your side while in a flat bed without using bedrails?: A Lot Help needed moving from lying on your back to sitting on the side of a flat bed without using bedrails?: A Lot Help needed moving to and from a bed to a chair (including a wheelchair)?: Total Help needed standing up from a chair using your arms (e.g., wheelchair or bedside chair)?: Total Help needed to walk in hospital room?: Total Help needed climbing 3-5 steps with a railing? : Total 6 Click Score: 8    End of Session Equipment Utilized During Treatment: Oxygen Activity Tolerance: Patient limited by pain Patient left: in bed;with call bell/phone within reach;with bed alarm set Nurse Communication: Mobility status;Other (comment) (orthostatic hypotension) PT Visit Diagnosis: Muscle weakness (generalized) (M62.81);Difficulty in walking, not elsewhere classified (R26.2);Pain;Dizziness and giddiness (R42) Pain - Right/Left: Right Pain - part of body: Knee (back and L shoulder)    Time: 7673-4193 PT Time Calculation (min) (ACUTE ONLY): 35 min   Charges:   PT Evaluation $PT Eval  Moderate Complexity: 1 Mod PT Treatments $Therapeutic Activity: 8-22 mins        Yunus Stoklosa B. Migdalia Dk PT, DPT Acute Rehabilitation Services Please use secure chat or  Call  Office [phone]   [place] 04/12/2022, 10:52 AM

## 2022-04-12 NOTE — Progress Notes (Addendum)
PROGRESS NOTE    Charlotte Castro  OIN:[id number] DOB: 1933-04-23 DOA: 04/10/2022 PCP: Isaac Bliss, Rayford Halsted, MD    Brief Narrative:  Charlotte Castro is a 88 years old female with past medical history of hypertension, hyperlipidemia, atrial fibrillation flutter on Coumadin, CHF, hypothyroidism, anxiety, osteoarthritis, chronic nausea, GERD presented to hospital with back and chest pain with multiple other symptoms including shortness of breath palpitation tremors.  Patient did have tremors for over 5 years.  Also complains of presyncopal symptoms and dizziness and has been taking frequent breaks.  Has chronic leg swelling which is unchanged.  Previously presented to ED on on 10/19 with similar complaints but ultimately was discharged home without any medications.  On this admission patient was afebrile.  Had atrial fibrillation with rate in the 150s.  Labs were significant for WBC at 13.  Creatinine at 1.2.  INR was 3.2.  Troponin 15 followed by 16.  Chest x-ray showed a large hiatal hernia.  Patient was then started on Cardizem drip and was admitted to hospital for further evaluation and treatment.  Assessment and Plan:   Principal Problem:   Orthostatic hypertension Active Problems:   Chest pain   Permanent atrial fibrillation (HCC)   Long term current use of anticoagulant therapy   Supratherapeutic INR   Arthritis, multiple joint involvement   Radiculopathy   Renal insufficiency   Nausea and vomiting   Chronic diastolic CHF (congestive heart failure) (HCC)   Essential hypertension, benign   Urinary frequency   Hypothyroidism   Leukocytosis   Chest pain Multiple side pain including chest pain back pain with anxiety hiatal hernia..  Troponin is normal.  EKG without any concerning for ischemic changes.  Possibility of musculoskeletal pain.  High-sensitivity negative x2.  EKG without any clear concerning features.  2D echocardiogram showed LV ejection fraction of 60 to 65% with no  regional wall motion abnormality.  Right ventricular systolic pressure was 32.  PPI has been recommended by cardiology for concerns of viral hernia..  Permanent atrial fibrillation with RVR/atrial flutter on chronic anticoagulation supratherapeutic INR Now in sinus rhythm.  On metoprolol as outpatient 25 mg twice a day.  Could increase to 50 mg if blood pressure stable  Has been resumed on metoprolol at this time.  INR was supratherapeutic on presentation.  TSH of 0.6.  Continue Cardizem.  Continue Coumadin as per pharmacy.  Leukocytosis Likely reactive in nature.  Initial white count of 13 K now at 10.3.  Chest x-ray without any acute findings.  For COVID.  Unclear cause of patient's symptoms at this time.  Chest x-ray did not note any acute abnormality.  Urinalysis pending.  Requested nursing staff about sending the sample.  Arthritis with multiple joint involvement  radiculopathy Patient presented with improved joint and body part pain.  MRI of the cervical spine 01/2021 noted multilevel disc degeneration and spine in the cervical spine with spinal and foraminal stenosis.  Patient was given lidocaine patch. Prior back surgey several years ago.  Continue gabapentin and tramadol.  X-ray of the cervical and thoracic spine without fractures.  Physical therapy has recommended skilled nursing facility placement at this time.  Acute kidney injury on chronic kidney disease stage IIIa Creatinine 1.21 with BUN 13 on admission.  Baseline creatinine previously noted to be around 0.98.  Creatinine today at 2.1.  We will continue to monitor.  Bladder scan.  Intake and output charting Daily weights.  Avoid nephrotoxic medication.  Encourage oral hydration.  Continue  with normal saline for 1 day due to orthostatic hypotension and AKI.   Nausea and vomiting None this morning.  Continue antiemetics.   Chronic diastolic CHF  Last 2D echocardiogram with preserved LV function.  Continue to monitor intake and output  charting Daily weights.  Would reassess for fluids need in AM.   Essential hypertension, now with orthostatic hypotension. Continue metoprolol.  Blood pressure slightly elevated patient noted to be orthostatic.  We will continue with IV fluids.  Add TED hose.  Continue PT OT.   Urinary frequency Urinalysis pending.   Hypothyroidism Continue Synthroid.  TSH of 0.6 at this time.   Large hiatal hernia Chronic.  Chest x-ray notes unchanged large hiatal hernia.  Continue Protonix and elevate head of bed.  Debility, weakness, deconditioning.  Physical therapy, occupational Therapy has recommended skilled nursing facility placement at this time.    DVT prophylaxis: Warfarin.   Code Status:     Code Status: Full Code  Disposition: Skilled nursing facility as per PT evaluation.  Status is: Inpatient  Remains inpatient appropriate because: Orthostatic hypotension, multiple symptoms, need for rehabilitation, IV fluids, acute kidney injury.   Family Communication:  Spoke with the patient's daughter on the phone and updated her about the clinical condition of the patient.  Consultants:  Cardiology  Procedures:  None  Antimicrobials:  None  Anti-infectives (From admission, onward)    None      Subjective: Today, patient was seen and examined at bedside.  Patient complains of multiple things including dizziness lightheadedness back pain neck pain.  Appears anxious.  Objective: Vitals:   04/12/22 1005 04/12/22 1010 04/12/22 1017 04/12/22 1300  BP: 117/80 99/72 (!) 146/60 (!) 134/55  Pulse: 78 73 72 65  Resp: 18 14 (!) 21 18  Temp:    (!) 97.4 F (36.3 C)  TempSrc:    Oral  SpO2: 100% 100% 100% 99%  Weight:      Height:        Intake/Output Summary (Last 24 hours) at 04/12/2022 1356 Last data filed at 04/11/2022 2142 Gross per 24 hour  Intake 302.56 ml  Output --  Net 302.56 ml   Filed Weights   04/11/22 0616  Weight: 88 kg    Physical Examination: Body mass  index is 28.65 kg/m.   General:  Average built, not in obvious distress anxious, HENT:   No scleral pallor or icterus noted. Oral mucosa is moist.  Tenderness over the cervical spine. Chest:   Diminished breath sounds bilaterally. No crackles or wheezes.  CVS: S1 &S2 heard. No murmur.  Regular rate and rhythm. Abdomen: Soft, nontender, nondistended.  Bowel sounds are heard.   Extremities: No cyanosis, clubbing, with mild pitting edema, peripheral pulses are palpable. Psych: Alert, awake and alert, anxious, CNS:  No cranial nerve deficits.  Moves all extremities. Skin: Warm and dry.  No rashes noted.  Data Reviewed:   CBC: Recent Labs  Lab 04/10/22 0819 04/12/22 0108  WBC 13.0* 10.3  HGB 13.9 11.9*  HCT 39.4 33.8*  MCV 94.5 94.4  PLT 217 510    Basic Metabolic Panel: Recent Labs  Lab 04/10/22 0819 04/11/22 1237 04/12/22 0108  NA 138  --  137  K 3.8  --  4.7  CL 105  --  106  CO2 23  --  24  GLUCOSE 138*  --  109*  BUN 13  --  38*  CREATININE 1.21*  --  2.14*  CALCIUM 9.0  --  9.0  MG  --  1.8  --     Liver Function Tests: No results for input(s): "AST", "ALT", "ALKPHOS", "BILITOT", "PROT", "ALBUMIN" in the last 168 hours.   Radiology Studies: ECHOCARDIOGRAM COMPLETE  Result Date: 04/12/2022    ECHOCARDIOGRAM REPORT   Patient Name:   Charlotte Castro Date of Exam: 04/12/2022 Medical Rec #:  [id number]      Height:       69.0 in Accession #:    [id number]     Weight:       194.0 lb Date of Birth:  [birth year]      BSA:          2.039 m Patient Age:    89 years       BP:           144/65 mmHg Patient Gender: F              HR:           73 bpm. Exam Location:  Inpatient Procedure: 2D Echo, Cardiac Doppler, Color Doppler and Intracardiac            Opacification Agent Indications:    R07.9* Chest pain, unspecified  History:        Patient has prior history of Echocardiogram examinations, most                 recent 07/15/2021. CHF, Arrythmias:Atrial Fibrillation, Atrial                  Flutter and Tachycardia; Risk Factors:Hypertension and                 Dyslipidemia.  Sonographer:    Bernadene Person RDCS Referring Phys: [id number] RONDELL A SMITH  Sonographer Comments: Technically difficult study due to poor echo windows. Image acquisition challenging due to respiratory motion. IMPRESSIONS  1. Left ventricular ejection fraction, by estimation, is 60 to 65%. The left ventricle has normal function. The left ventricle has no regional wall motion abnormalities. There is mild left ventricular hypertrophy. Left ventricular diastolic parameters are indeterminate.  2. Right ventricular systolic function is normal. The right ventricular size is normal. There is normal pulmonary artery systolic pressure. The estimated right ventricular systolic pressure is 27.7 mmHg.  3. Left atrial size was moderately dilated.  4. The mitral valve is degenerative. Mild to moderate mitral valve regurgitation, seen well in only one view due to image quality. No evidence of mitral stenosis. Moderate mitral annular calcification.  5. The aortic valve is abnormal. There is moderate calcification of the aortic valve. Aortic valve regurgitation is trivial. No aortic stenosis is present.  6. The inferior vena cava is normal in size with greater than 50% respiratory variability, suggesting right atrial pressure of 3 mmHg. FINDINGS  Left Ventricle: Left ventricular ejection fraction, by estimation, is 60 to 65%. The left ventricle has normal function. The left ventricle has no regional wall motion abnormalities. Definity contrast agent was given IV to delineate the left ventricular  endocardial borders. The left ventricular internal cavity size was normal in size. There is mild left ventricular hypertrophy. Left ventricular diastolic parameters are indeterminate. Right Ventricle: The right ventricular size is normal. No increase in right ventricular wall thickness. Right ventricular systolic function is normal. There is  normal pulmonary artery systolic pressure. The tricuspid regurgitant velocity is 2.73 m/s, and  with an assumed right atrial pressure of 3 mmHg, the estimated right ventricular systolic pressure is 82.4 mmHg. Left Atrium: Left atrial  size was moderately dilated. Right Atrium: Right atrial size was normal in size. Pericardium: There is no evidence of pericardial effusion. Mitral Valve: The mitral valve is degenerative in appearance. Moderate mitral annular calcification. Mild to moderate mitral valve regurgitation. No evidence of mitral valve stenosis. Tricuspid Valve: The tricuspid valve is normal in structure. Tricuspid valve regurgitation is trivial. No evidence of tricuspid stenosis. Aortic Valve: The aortic valve is abnormal. There is moderate calcification of the aortic valve. Aortic valve regurgitation is trivial. No aortic stenosis is present. Pulmonic Valve: The pulmonic valve was not well visualized. Pulmonic valve regurgitation is not visualized. No evidence of pulmonic stenosis. Aorta: The ascending aorta was not well visualized and the aortic root is normal in size and structure. Venous: The inferior vena cava is normal in size with greater than 50% respiratory variability, suggesting right atrial pressure of 3 mmHg. IAS/Shunts: No atrial level shunt detected by color flow Doppler.  LEFT VENTRICLE PLAX 2D LVIDd:         4.50 cm      Diastology LVIDs:         2.80 cm      LV e' medial:    10.30 cm/s LV PW:         1.00 cm      LV E/e' medial:  9.6 LV IVS:        1.00 cm      LV e' lateral:   11.80 cm/s LVOT diam:     1.80 cm      LV E/e' lateral: 8.4 LV SV:         46 LV SV Index:   22 LVOT Area:     2.54 cm  LV Volumes (MOD) LV vol d, MOD A2C: 110.0 ml LV vol d, MOD A4C: 79.8 ml LV vol s, MOD A2C: 35.0 ml LV vol s, MOD A4C: 31.2 ml LV SV MOD A2C:     75.0 ml LV SV MOD A4C:     79.8 ml LV SV MOD BP:      67.0 ml RIGHT VENTRICLE RV S prime:     11.30 cm/s TAPSE (M-mode): 1.9 cm LEFT ATRIUM              Index        RIGHT ATRIUM           Index LA diam:        5.40 cm 2.65 cm/m   RA Area:     15.60 cm LA Vol (A2C):   87.6 ml 42.95 ml/m  RA Volume:   35.10 ml  17.21 ml/m LA Vol (A4C):   91.0 ml 44.62 ml/m LA Biplane Vol: 92.9 ml 45.55 ml/m  AORTIC VALVE LVOT Vmax:   94.30 cm/s LVOT Vmean:  63.400 cm/s LVOT VTI:    0.179 m  AORTA Ao Root diam: 3.30 cm Ao Asc diam:  3.20 cm MITRAL VALVE               TRICUSPID VALVE MV Area (PHT): 4.39 cm    TR Peak grad:   29.8 mmHg MV Decel Time: 173 msec    TR Vmax:        273.00 cm/s MV E velocity: 99.00 cm/s                            SHUNTS  Systemic VTI:  0.18 m                            Systemic Diam: 1.80 cm Cherlynn Kaiser MD Electronically signed by Cherlynn Kaiser MD Signature Date/Time: 04/12/2022/11:09:44 AM    Final    DG Lumbar Spine 2-3 Views  Result Date: 04/11/2022 CLINICAL DATA:  Back pain. EXAM: LUMBAR SPINE - 2-3 VIEW COMPARISON:  CT scan of the abdomen and pelvis August 07, 2020. Chest x-rays April 01, 2022 and April 10, 2022. FINDINGS: Disc spacer devices are identified at L4-5 and L5-S1. Anterolisthesis of L4 versus L5 and L5 versus S1 remains measuring 7 mm at the L4-5 level and 10 mm at the L5-S1 level. No other malalignment. There is suggested loss of height at the T9 level. In retrospect, this may be present on the thoracic spine films although this area is poorly evaluated on both studies. This finding was not present on the comparison CT scan or chest x-rays. Multilevel degenerative disc disease most marked at L2-3. Lower lumbar facet degenerative changes. Calcified atherosclerotic change in the abdominal aorta. IMPRESSION: 1. The T9 level is poorly evaluated on both today's thoracic spine and lumbar spine films. However, there is suspected loss of height not identified on previous studies. Recommend a CT scan of the thoracic spine for better evaluation. 2. Disc spacer devices as above. Anterolisthesis of L4  versus L5 and L5 versus S1 is above. Degenerative changes. Electronically Signed   By: Dorise Bullion III M.D.   On: 04/11/2022 11:42   DG Cervical Spine 2-3Vclearing  Result Date: 04/11/2022 CLINICAL DATA:  Pain. EXAM: LIMITED CERVICAL SPINE FOR TRAUMA CLEARING - 2-3 VIEW COMPARISON:  None Available. FINDINGS: The cervical spine is only well seen through the C6 level on lateral imaging. C7 and T1 are largely obscured by overlapping soft tissues. 3 mm anterolisthesis of C3 versus C4 is unchanged since the January 14, 2021 MRI. There is straightening of normal lordosis. No other malalignment identified. No fractures. Degenerative disc disease is most marked at C4-5 and C5-6. Multilevel uncovertebral degenerative changes identified. The pre odontoid space and prevertebral soft tissues are normal. Lung apices are unremarkable. IMPRESSION: 1. The cervical spine is only well seen through the C6 level on lateral imaging. C7 and T1 are largely obscured by overlapping soft tissues. C7 and T1 could be better assessed with CT imaging if there is concern at these levels. 2. 3 mm anterolisthesis of C3 versus C4 is unchanged. 3. Degenerative disc disease and uncovertebral degenerative changes as above. Electronically Signed   By: Dorise Bullion III M.D.   On: 04/11/2022 11:36   DG Thoracic Spine 2 View  Result Date: 04/11/2022 CLINICAL DATA:  Back pain.  Leg pain. EXAM: THORACIC SPINE 2 VIEWS COMPARISON:  Chest x-ray April 10, 2022 FINDINGS: A large hiatal hernia is again identified. The heart, hila, mediastinum, lungs, and pleura are otherwise unremarkable. The lateral view is limited due to poor penetration. Mild anterior wedging of a midthoracic vertebral body is unchanged since April 01, 2022 chest x-ray imaging. IMPRESSION: The lateral view is markedly limited due to lack of penetration. Anterior wedging of a midthoracic vertebral body appears to be stable. If there is concern for fracture, recommend CT  imaging for better evaluation. Electronically Signed   By: Dorise Bullion III M.D.   On: 04/11/2022 11:33      LOS: 0 days    Flora Lipps, MD [facility] Available via Epic secure chat 7am-7pm After these hours, please refer to coverage provider listed on [email or web address] 04/12/2022, 1:56 PM

## 2022-04-12 NOTE — NC FL2 (Signed)
[place] LEVEL OF CARE SCREENING TOOL     IDENTIFICATION  Patient Name: Charlotte Castro Birthdate: 1933/02/14 Sex: female Admission Date (Current Location): 04/10/2022  [facility] and [place] Number:  Herbalist and Address:  [facility], [place] [address], [place], [place] [address]      Provider Number: [id number]  Attending Physician Name and Address:  Flora Lipps, MD  Relative Name and Phone Number:       Current Level of Care: Hospital Recommended Level of Care: [place] Prior Approval Number:    Date Approved/Denied:   PASRR Number: [id number]  Discharge Plan: SNF    Current Diagnoses: Patient Active Problem List   Diagnosis Date Noted   Orthostatic hypertension 04/12/2022   Renal insufficiency 04/11/2022   Arthritis, multiple joint involvement 04/11/2022   Urinary frequency 04/11/2022   Supratherapeutic INR 04/11/2022   Radiculopathy 04/11/2022   Leukocytosis 04/11/2022   Chest pain 01/13/2021   Orthostasis 01/13/2021   Abdominal pain    Acute cystitis without hematuria    Hiatal hernia 01/29/2019   AKI (acute kidney injury) ([place]) 01/14/2019   B12 deficiency 08/08/2018   Hypothyroidism 08/08/2018   Endometrial cancer ([place]) 08/05/2017   Toe ulcer, left, with unspecified severity ([place]) 08/04/2017   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 ([place]) 08/04/2017   Postmenopausal bleeding 08/01/2017   Epigastric abdominal pain 04/05/2017   Delirium 04/14/2016   Tremor 04/14/2016   Chronic diastolic CHF (congestive heart failure) ([place]) 04/10/2016   Nausea and vomiting 10/09/2015   Essential hypertension, benign 01/18/2014   Long term current use of anticoagulant therapy 01/18/2014   Permanent atrial fibrillation ([place]) 06/10/2011    Orientation RESPIRATION BLADDER Height & Weight     Self, Time, Situation, Place  O2 (Nasal Cannula 3 liters) Continent Weight: 194 lb  0.1 oz (88 kg) Height:  '5\' 9"'$  (175.3 cm)  BEHAVIORAL SYMPTOMS/MOOD NEUROLOGICAL BOWEL NUTRITION STATUS      Continent (WDL) Diet (Please see discharge summary)  AMBULATORY STATUS COMMUNICATION OF NEEDS Skin   Extensive Assist Verbally Other (Comment) (WDL,Wound Incision LDAs)                       Personal Care Assistance Level of Assistance  Bathing, Feeding, Dressing Bathing Assistance: Maximum assistance Feeding assistance: Independent Dressing Assistance: Maximum assistance     Functional Limitations Info  Sight, Hearing, Speech Sight Info: Adequate (WDL) Hearing Info: Adequate Speech Info: Adequate    SPECIAL CARE FACTORS FREQUENCY  PT (By licensed PT), OT (By licensed OT)     PT Frequency: 5x min weekly OT Frequency: 5x min weekly            Contractures Contractures Info: Not present    Additional Factors Info  Code Status, Allergies Code Status Info: FULL Allergies Info: Iodinated Contrast Media,Zosyn (piperacillin Sod-tazobactam So),Penicillins           Current Medications (04/12/2022):  This is the current hospital active medication list Current Facility-Administered Medications  Medication Dose Route Frequency Provider Last Rate Last Admin   acetaminophen (TYLENOL) tablet 650 mg  650 mg Oral Q6H PRN Fuller Plan A, MD   650 mg at 04/11/22 1659   Or   acetaminophen (TYLENOL) suppository 650 mg  650 mg Rectal Q6H PRN Fuller Plan A, MD       albuterol (PROVENTIL) (2.5 MG/3ML) 0.083% nebulizer solution 2.5 mg  2.5 mg Nebulization  Q6H PRN Norval Morton, MD       gabapentin (NEURONTIN) capsule 100 mg  100 mg Oral BID Fuller Plan A, MD   100 mg at 04/12/22 3338   hydrALAZINE (APRESOLINE) injection 10 mg  10 mg Intravenous Q4H PRN Norval Morton, MD       levothyroxine (SYNTHROID) tablet 88 mcg  88 mcg Oral QAC breakfast Fuller Plan A, MD   88 mcg at 04/12/22 0601   lidocaine (LIDODERM) 5 % 3 patch  3 patch Transdermal Q24H Palumbo, April,  MD   3 patch at 04/12/22 0602   metoprolol tartrate (LOPRESSOR) tablet 25 mg  25 mg Oral BID Bernerd Pho M, PA-C   25 mg at 04/12/22 0924   ondansetron (ZOFRAN) tablet 4 mg  4 mg Oral Q6H PRN Fuller Plan A, MD       Or   ondansetron (ZOFRAN) injection 4 mg  4 mg Intravenous Q6H PRN Fuller Plan A, MD       pantoprazole (PROTONIX) EC tablet 40 mg  40 mg Oral Daily Bernerd Pho M, PA-C   40 mg at 04/12/22 3291   sodium chloride flush (NS) 0.9 % injection 3 mL  3 mL Intravenous Q12H Smith, Rondell A, MD   3 mL at 04/12/22 0924   traMADol (ULTRAM) tablet 50 mg  50 mg Oral Q6H Smith, Rondell A, MD   50 mg at 04/12/22 1259   Warfarin - Pharmacist Dosing Inpatient   Does not apply q1600 Bertis Ruddy, [facility]   Given at 04/11/22 1704     Discharge Medications: Please see discharge summary for a list of discharge medications.  Relevant Imaging Results:  Relevant Lab Results:   Additional Information [phone]  Milas Gain, LCSWA

## 2022-04-12 NOTE — Patient Outreach (Signed)
  Care Coordination   Initial Visit Note   04/12/2022 Name: Charlotte Castro MRN: [id number] DOB: Nov 23, 1932  Charlotte Castro is a 88 y.o. year old female who sees Isaac Bliss, Rayford Halsted, MD for primary care. I spoke with  [name] by phone today.  What matters to the patients health and wellness today?  Care Coordination    Goals Addressed             This Visit's Progress    COMPLETED: LCSW Care Coordination Activities-No Follow Up Required       Care Coordination Interventions: Active listening / Reflection utilized  Emotional Support Provided Pt reports that her chest pain has decreased but transferred to other side of chest. She endorses "sharp" shoulder pain LCSW completed SDOH screenings. Pt's son provides transportation to medical appts. Pt reports stable housing Pt reports having a strong support system to assist her with management of health conditions LCSW informed patient of care coordination services. Pt is not interested at this time and agreed to contact PCP, should needs arise          SDOH assessments and interventions completed:  Yes  SDOH Interventions Today    Flowsheet Row Most Recent Value  SDOH Interventions   Housing Interventions Intervention Not Indicated  Transportation Interventions Intervention Not Indicated        Care Coordination Interventions Activated:  Yes  Care Coordination Interventions:  Yes, provided   Follow up plan: No further intervention required.   Encounter Outcome:  Pt. Refused   Christa See, MSW, [email or web address] Phone [phone] 4:13 PM

## 2022-04-12 NOTE — Progress Notes (Signed)
PT Cancellation Note  Patient Details Name: Charlotte Castro MRN: [id number] DOB: 1932-10-21   Cancelled Treatment:    Reason Eval/Treat Not Completed: (P) Patient at procedure or test/unavailable Pt is currently getting ECHO. PT will follow back later for Evaluation.  Doloris Servantes B. Migdalia Dk PT, DPT Acute Rehabilitation Services Please use secure chat or  Call Office [phone]    [place] 04/12/2022, 9:02 AM

## 2022-04-12 NOTE — Progress Notes (Signed)
[place] for warfarin Indication: atrial fibrillation  Allergies  Allergen Reactions   Iodinated Contrast Media Shortness Of Breath and Other (See Comments)    "Allergic," per MAR- Causes headaches, also   Zosyn [Piperacillin Sod-Tazobactam So] Other (See Comments)    "Allergic," per [facility]   Penicillins Other (See Comments)    Tolerated Zosyn Oct 2018, but "Allergic," per [facility] Did it involve swelling of the face/tongue/throat, SOB, or low BP? Unk Did it involve sudden or severe rash/hives, skin peeling, or any reaction on the inside of your mouth or nose? Unk Did you need to seek medical attention at a hospital or doctor's office? Unk When did it last happen? Unk If all above answers are "NO", may proceed with cephalosporin use.     Patient Measurements: Height: '5\' 9"'$  (175.3 cm) Weight: 88 kg (194 lb 0.1 oz) IBW/kg (Calculated) : 66.2   Vital Signs: Temp: 97.8 F (36.6 C) (10/30 0900) Temp Source: Oral (10/30 0900) BP: 146/60 (10/30 1017) Pulse Rate: 72 (10/30 1017)  Labs: Recent Labs    04/10/22 0819 04/10/22 1851 04/11/22 0607 04/12/22 0108  HGB 13.9  --   --  11.9*  HCT 39.4  --   --  33.8*  PLT 217  --   --  183  LABPROT  --   --  32.4* 38.7*  INR  --   --  3.2* 4.0*  CREATININE 1.21*  --   --  2.14*  TROPONINIHS 16 15  --   --      Estimated Creatinine Clearance: 21.9 mL/min (A) (by C-G formula based on SCr of 2.14 mg/dL (H)).   Medical History: Past Medical History:  Diagnosis Date   Anemia    history of   Anxiety    Arthritis    "left knee" (04/05/2017)   Atrial fibrillation (HCC)    Atrial flutter ([place])    typical appearing   Atrial tachycardia    ablated 11/17/10  by JA  from the [facility] of the aorta   CHF (congestive heart failure) (HCC)    Chronic lower back pain    Chronic nausea    Endometrial cancer (HCC)    grade 1   Gallstone pancreatitis    Gastritis    GERD (gastroesophageal reflux disease)     History of hiatal hernia    HTN (hypertension)    Hyperlipemia    Hypothyroidism    Obesity    Osteoporosis    Toe ulcer ([place])    left 3rd toe   Vitamin D deficiency      Assessment: 80 YOF presenting with CP, in afib RVR, hx of afib on warfarin PTA (last dose 10/27).  INR today jumped from 3.2 to 4. Hgb 11.9, plt 183. Oral intake documented at 70% for 1 meal yesterday. No s/sx of bleeding noted.   PTA dosing: '5mg'$  MWF, 2.5 mg all other days  Goal of Therapy:  INR 2-3 Monitor platelets by anticoagulation protocol: Yes   Plan:  Hold warfarin tonight  Daily INR, s/s bleeding  Antonietta Jewel, PharmD, BCCCP Clinical Pharmacist  Phone: [phone] 04/12/2022 11:23 AM  Please check AMION for all [place] phone numbers After 10:00 PM, call [place] [phone]

## 2022-04-12 NOTE — Progress Notes (Signed)
Progress Note  Patient Name: Charlotte Castro Date of Encounter: 04/12/2022  Primary Cardiologist: Larae Grooms, MD   Subjective   Patient seen and examined at her bedside.  She offers no complaints at this time.  She was awake in bed when I arrived.  Inpatient Medications    Scheduled Meds:  gabapentin  100 mg Oral BID   levothyroxine  88 mcg Oral QAC breakfast   lidocaine  3 patch Transdermal Q24H   metoprolol tartrate  25 mg Oral BID   pantoprazole  40 mg Oral Daily   sodium chloride flush  3 mL Intravenous Q12H   traMADol  50 mg Oral Q6H   Warfarin - Pharmacist Dosing Inpatient   Does not apply q1600   Continuous Infusions:  PRN Meds: acetaminophen **OR** acetaminophen, albuterol, hydrALAZINE, ondansetron **OR** ondansetron (ZOFRAN) IV, perflutren lipid microspheres (DEFINITY) IV suspension   Vital Signs    Vitals:   04/12/22 0933 04/12/22 1005 04/12/22 1010 04/12/22 1017  BP: (!) 147/61 117/80 99/72 (!) 146/60  Pulse:  78 73 72  Resp: '17 18 14 '$ (!) 21  Temp:      TempSrc:      SpO2:  100% 100% 100%  Weight:      Height:        Intake/Output Summary (Last 24 hours) at 04/12/2022 1030 Last data filed at 04/11/2022 2142 Gross per 24 hour  Intake 302.56 ml  Output --  Net 302.56 ml   Filed Weights   04/11/22 0616  Weight: 88 kg    Telemetry    Sinus rhythm- Personally Reviewed  ECG    None today- Personally Reviewed  Physical Exam    General: Comfortable, sitting up in a chair Head: Atraumatic, normal size  Eyes: PEERLA, EOMI  Neck: Supple, normal JVD Cardiac: Normal S1, S2; RRR; no murmurs, rubs, or gallops Lungs: Clear to auscultation bilaterally Abd: Soft, nontender, no hepatomegaly  Ext: warm, no edema Musculoskeletal: No deformities, BUE and BLE strength normal and equal Skin: Warm and dry, no rashes   Neuro: Alert and oriented to person, place, time, and situation, CNII-XII grossly intact, no focal deficits  Psych: Normal mood  and affect   Labs    Chemistry Recent Labs  Lab 04/10/22 0819 04/12/22 0108  NA 138 137  K 3.8 4.7  CL 105 106  CO2 23 24  GLUCOSE 138* 109*  BUN 13 38*  CREATININE 1.21* 2.14*  CALCIUM 9.0 9.0  GFRNONAA 43* 22*  ANIONGAP 10 7     Hematology Recent Labs  Lab 04/10/22 0819 04/12/22 0108  WBC 13.0* 10.3  RBC 4.17 3.58*  HGB 13.9 11.9*  HCT 39.4 33.8*  MCV 94.5 94.4  MCH 33.3 33.2  MCHC 35.3 35.2  RDW 13.2 13.3  PLT 217 183    Cardiac EnzymesNo results for input(s): "TROPONINI" in the last 168 hours. No results for input(s): "TROPIPOC" in the last 168 hours.   BNPNo results for input(s): "BNP", "PROBNP" in the last 168 hours.   DDimer No results for input(s): "DDIMER" in the last 168 hours.   Radiology    DG Lumbar Spine 2-3 Views  Result Date: 04/11/2022 CLINICAL DATA:  Back pain. EXAM: LUMBAR SPINE - 2-3 VIEW COMPARISON:  CT scan of the abdomen and pelvis August 07, 2020. Chest x-rays April 01, 2022 and April 10, 2022. FINDINGS: Disc spacer devices are identified at L4-5 and L5-S1. Anterolisthesis of L4 versus L5 and L5 versus S1 remains measuring 7 mm  at the L4-5 level and 10 mm at the L5-S1 level. No other malalignment. There is suggested loss of height at the T9 level. In retrospect, this may be present on the thoracic spine films although this area is poorly evaluated on both studies. This finding was not present on the comparison CT scan or chest x-rays. Multilevel degenerative disc disease most marked at L2-3. Lower lumbar facet degenerative changes. Calcified atherosclerotic change in the abdominal aorta. IMPRESSION: 1. The T9 level is poorly evaluated on both today's thoracic spine and lumbar spine films. However, there is suspected loss of height not identified on previous studies. Recommend a CT scan of the thoracic spine for better evaluation. 2. Disc spacer devices as above. Anterolisthesis of L4 versus L5 and L5 versus S1 is above. Degenerative  changes. Electronically Signed   By: Dorise Bullion III M.D.   On: 04/11/2022 11:42   DG Cervical Spine 2-3Vclearing  Result Date: 04/11/2022 CLINICAL DATA:  Pain. EXAM: LIMITED CERVICAL SPINE FOR TRAUMA CLEARING - 2-3 VIEW COMPARISON:  None Available. FINDINGS: The cervical spine is only well seen through the C6 level on lateral imaging. C7 and T1 are largely obscured by overlapping soft tissues. 3 mm anterolisthesis of C3 versus C4 is unchanged since the January 14, 2021 MRI. There is straightening of normal lordosis. No other malalignment identified. No fractures. Degenerative disc disease is most marked at C4-5 and C5-6. Multilevel uncovertebral degenerative changes identified. The pre odontoid space and prevertebral soft tissues are normal. Lung apices are unremarkable. IMPRESSION: 1. The cervical spine is only well seen through the C6 level on lateral imaging. C7 and T1 are largely obscured by overlapping soft tissues. C7 and T1 could be better assessed with CT imaging if there is concern at these levels. 2. 3 mm anterolisthesis of C3 versus C4 is unchanged. 3. Degenerative disc disease and uncovertebral degenerative changes as above. Electronically Signed   By: Dorise Bullion III M.D.   On: 04/11/2022 11:36   DG Thoracic Spine 2 View  Result Date: 04/11/2022 CLINICAL DATA:  Back pain.  Leg pain. EXAM: THORACIC SPINE 2 VIEWS COMPARISON:  Chest x-ray April 10, 2022 FINDINGS: A large hiatal hernia is again identified. The heart, hila, mediastinum, lungs, and pleura are otherwise unremarkable. The lateral view is limited due to poor penetration. Mild anterior wedging of a midthoracic vertebral body is unchanged since April 01, 2022 chest x-ray imaging. IMPRESSION: The lateral view is markedly limited due to lack of penetration. Anterior wedging of a midthoracic vertebral body appears to be stable. If there is concern for fracture, recommend CT imaging for better evaluation. Electronically Signed    By: Dorise Bullion III M.D.   On: 04/11/2022 11:33    Cardiac Studies   Nuclear stress test 2018 IMPRESSION: 1. No reversible ischemia or infarction.   2. Normal left ventricular wall motion.   3. Left ventricular ejection fraction 68%   4. Non invasive risk stratification*: Low   Echocardiogram: 07/2021  IMPRESSIONS   1. Left ventricular ejection fraction, by estimation, is 60 to 65%. The  left ventricle has normal function. The left ventricle has no regional  wall motion abnormalities. There is mild concentric left ventricular  hypertrophy. Left ventricular diastolic  parameters are indeterminate. The average left ventricular global  longitudinal strain is -15.9 %. The global longitudinal strain is normal.   2. Right ventricular systolic function is normal. The right ventricular  size is normal. There is normal pulmonary artery systolic pressure. The  estimated right ventricular systolic pressure is 70.3 mmHg.   3. Left atrial size was mildly dilated.   4. The mitral valve is normal in structure. Mild mitral valve  regurgitation. No evidence of mitral stenosis.   5. The aortic valve is calcified. Aortic valve regurgitation is mild.  Aortic valve sclerosis/calcification is present, without any evidence of  aortic stenosis.   6. The inferior vena cava is normal in size with greater than 50%  respiratory variability, suggesting right atrial pressure of 3 mmHg.   Patient Profile     88 y.o. female with a hx of chest pain (normal cors by cath in 2012 and low-risk NST in 05/2017), paroxysmal atrial fibrillation/atrial tachycardia, HTN, HLD, hypothyroidism and history of endometrial cancer.  Assessment & Plan    Atypical chest pain Paroxysmal atrial flutter/fibrillation Hypertension Stage III chronic kidney disease   Her chest pain has resolved.  She denies this this morning.  Her features of her chest pain that has been described sounded more atypical.  Suspected that it  could be in the setting of a hiatal hernia.  She was started on her PPI 40 mg daily. Her high-sensitivity troponin has been negative.  EKG with no acute ischemic changes.  We will continue to monitor the patient her echocardiogram is pending should she have significant depressed EF or wall motion abnormality we can pursue an ischemic evaluation if none of these are present we will recommend continuing her current medication regimen including her Protonix.  She is back in sinus rhythm.  Continue her Lopressor 25 mg twice daily.  Her blood pressure is variable so we will have to monitor if needed can increase the Lopressor to 50 mg twice daily.  Monitor kidney function as patient does have chronic kidney disease avoid nephrotoxin.      For questions or updates, please contact [place] Please consult [email or web address] for contact info under Cardiology/STEMI.      Signed, Craigory Toste, DO  04/12/2022, 10:30 AM

## 2022-04-12 NOTE — Hospital Course (Addendum)
Charlotte Castro is a 88 years old female with past medical history of hypertension, hyperlipidemia, atrial fibrillation flutter on Coumadin, CHF, hypothyroidism, anxiety, osteoarthritis, chronic nausea, GERD presented to hospital with back and chest pain with multiple other symptoms including shortness of breath palpitation tremors.  Patient did have tremors for over 5 years.  Also complains of presyncopal symptoms and dizziness and has been taking frequent breaks.  Has chronic leg swelling which is unchanged.  Previously presented to ED on on 10/19 with similar complaints but ultimately was discharged home without any medications.  On this admission patient was afebrile.  Had atrial fibrillation with rate in the 150s.  Labs were significant for WBC at 13.  Creatinine at 1.2.  INR was 3.2.  Troponin 15 followed by 16.  Chest x-ray showed a large hiatal hernia.  Patient was then started on Cardizem drip and was admitted to hospital for further evaluation and treatment.

## 2022-04-12 NOTE — Evaluation (Signed)
Occupational Therapy Evaluation Patient Details Name: Charlotte Castro MRN: [id number] DOB: May 22, 1933 Today's Date: 04/12/2022   History of Present Illness 88 y.o. female who presents with complaints of back and chest pain 10/28 Found to have paroxysmal A fib-A flutter rate in 150s. PMH: hypertension, hyperlipidemia, atrial fibrillation/flutter on Coumadin, CHF, hypothyroidism, anxiety, osteoarthritis, chronic nausea, GERD   Clinical Impression   Nahla was evaluated s/p the above admission list, she lives alone and is mod I at baseline. She does not drive and her family assists with IADLs. Upon evaluation pt had functional limitations due to generalized weakness, BUE and RLE pain, decreased activity tolerance and impaired balance. Overall she required min-mod A for bed mobility, and min A to stand from elevated bed height. Pt's BP did drop from 146/60 in supine to 128/70 in sitting, however asymptomatic. Pt unable to tolerate standing long enough to check pressure again. Due to deficits listed below, she does require dup to max A +2 for LB ADLs. OT to continue to follow acutely. Recommend d/c to SNF for continued therapy.      Recommendations for follow up therapy are one component of a multi-disciplinary discharge planning process, led by the attending physician.  Recommendations may be updated based on patient status, additional functional criteria and insurance authorization.   Follow Up Recommendations  Skilled nursing-short term rehab (<3 hours/day)    Assistance Recommended at Discharge Frequent or constant Supervision/Assistance  Patient can return home with the following A lot of help with walking and/or transfers;A lot of help with bathing/dressing/bathroom;Assistance with cooking/housework;Direct supervision/assist for medications management;Direct supervision/assist for financial management;Assist for transportation;Help with stairs or ramp for entrance    Functional Status  Assessment  Patient has had a recent decline in their functional status and demonstrates the ability to make significant improvements in function in a reasonable and predictable amount of time.  Equipment Recommendations  Other (comment) (defer)    Recommendations for Other Services       Precautions / Restrictions Precautions Precautions: Fall Precaution Comments: numerous recent falls Restrictions Weight Bearing Restrictions: No      Mobility Bed Mobility Overal bed mobility: Needs Assistance Bed Mobility: Supine to Sit, Sit to Supine     Supine to sit: Min assist Sit to supine: Mod assist   General bed mobility comments: min A to elevate trunk and mod A for BLE back into bed    Transfers Overall transfer level: Needs assistance Equipment used: Rolling walker (2 wheels) Transfers: Sit to/from Stand Sit to Stand: Min assist, From elevated surface           General transfer comment: significantly elevated bed height, able to obtain full upright standing. Unable to shift weight to step due to RLE pain      Balance Overall balance assessment: Needs assistance Sitting-balance support: Feet supported, Single extremity supported, Bilateral upper extremity supported, No upper extremity supported Sitting balance-[name]: Fair Sitting balance - Comments: able to sit EOB for BP measurements     Standing balance-[name]: Poor                             ADL either performed or assessed with clinical judgement   ADL Overall ADL's : Needs assistance/impaired Eating/Feeding: Independent;Sitting   Grooming: Set up;Sitting   Upper Body Bathing: Minimal assistance;Sitting   Lower Body Bathing: Maximal assistance;+2 for physical assistance;+2 for safety/equipment;Sit to/from stand   Upper Body Dressing : Set up;Sitting  Lower Body Dressing: Maximal assistance;+2 for physical assistance;+2 for safety/equipment;Sit to/from stand   Toilet Transfer:  Maximal assistance;+2 for physical assistance;+2 for safety/equipment;Stand-pivot;BSC/3in1   Toileting- Clothing Manipulation and Hygiene: Maximal assistance;+2 for physical assistance;+2 for safety/equipment;Sit to/from stand       Functional mobility during ADLs: Maximal assistance;Rolling walker (2 wheels) General ADL Comments: limited by impiared cognition, pain, weakness, limited BUE and RLE ROM     Vision Baseline Vision/History: 0 No visual deficits Vision Assessment?: No apparent visual deficits            Pertinent Vitals/Pain Pain Assessment Pain Assessment: Faces Faces Pain Scale: Hurts whole lot Pain Location: R knee with movement Pain Descriptors / Indicators: Grimacing, Guarding Pain Intervention(s): Limited activity within patient's tolerance, Monitored during session     Hand Dominance Right   Extremity/Trunk Assessment Upper Extremity Assessment Upper Extremity Assessment: RUE deficits/detail;LUE deficits/detail RUE Deficits / Details: limited shoulder ROM due to pain, generally weak. LUE Deficits / Details: limited shoulder ROM due to pain, generally weak.   Lower Extremity Assessment Lower Extremity Assessment: Defer to PT evaluation RLE Deficits / Details: R knee swollen, only tolerates about 45 degrees of AAROM before screaming, screams with gentle AAROM of ankle RLE: Unable to fully assess due to pain RLE Sensation: decreased light touch LLE Deficits / Details: L knee > AAROM than L however also screams LLE Sensation: decreased light touch   Cervical / Trunk Assessment Cervical / Trunk Assessment: Kyphotic   Communication Communication Communication: No difficulties   Cognition Arousal/Alertness: Awake/alert Behavior During Therapy: Anxious Overall Cognitive Status: Impaired/Different from baseline Area of Impairment: Following commands, Safety/judgement, Awareness, Problem solving                       Following Commands: Follows  one step commands inconsistently, Follows multi-step commands inconsistently Safety/Judgement: Decreased awareness of safety Awareness: Emergent Problem Solving: Difficulty sequencing, Requires verbal cues, Requires tactile cues General Comments: pt tangential and does not answer questions directly, internally distracted     General Comments  VSS on 2L, pt's BP did drop upon sitting but not techniqually orthostatic.     Home Living Family/patient expects to be discharged to:: Private residence Living Arrangements: Alone Available Help at Discharge: Family;Available PRN/intermittently Type of Home: House Home Access: Ramped entrance     Home Layout: One level     Bathroom Shower/Tub: Sponge bathes at baseline   Bathroom Toilet: Standard     Home Equipment: Rollator (4 wheels);Cane - quad;Cane - single point;BSC/3in1;Wheelchair - manual          Prior Functioning/Environment Prior Level of Function : Independent/Modified Independent             Mobility Comments: using quad cane for household ambulation, son drives her to appointments ADLs Comments: taking bird baths, attempts cooking and cleaning, daughter does grocery shopping        OT Problem List: Decreased strength;Decreased range of motion;Decreased activity tolerance;Impaired balance (sitting and/or standing);Decreased cognition;Decreased safety awareness;Decreased knowledge of use of DME or AE;Pain      OT Treatment/Interventions: Self-care/ADL training;Therapeutic exercise;DME and/or AE instruction;Balance training;Patient/family education;Therapeutic activities    OT Goals(Current goals can be found in the care plan section) Acute Rehab OT Goals Patient Stated Goal: less pain OT Goal Formulation: With patient Time For Goal Achievement: 04/26/22 Potential to Achieve Goals: Good ADL Goals Pt Will Perform Grooming: with min guard assist;standing Pt Will Perform Upper Body Dressing: sitting;with modified  independence Pt Will Perform  Lower Body Dressing: with min assist;sit to/from stand Pt Will Transfer to Toilet: with min assist;ambulating  OT Frequency: Min 2X/week       AM-PAC OT "6 Clicks" Daily Activity     Outcome Measure Help from another person eating meals?: None Help from another person taking care of personal grooming?: A Little Help from another person toileting, which includes using toliet, bedpan, or urinal?: A Lot Help from another person bathing (including washing, rinsing, drying)?: A Lot Help from another person to put on and taking off regular upper body clothing?: A Little Help from another person to put on and taking off regular lower body clothing?: A Lot 6 Click Score: 16   End of Session Equipment Utilized During Treatment: Gait belt;Rolling walker (2 wheels);Oxygen Nurse Communication: Mobility status  Activity Tolerance: Patient tolerated treatment well Patient left: in bed;with call bell/phone within reach;with bed alarm set  OT Visit Diagnosis: Unsteadiness on feet (R26.81);Other abnormalities of gait and mobility (R26.89);Muscle weakness (generalized) (M62.81);Pain                Time: 6606-3016 OT Time Calculation (min): 19 min Charges:  OT General Charges $OT Visit: 1 Visit OT Evaluation $OT Eval Moderate Complexity: 1 Mod   Vincentina Sollers D Causey 04/12/2022, 1:49 PM

## 2022-04-13 ENCOUNTER — Ambulatory Visit: Payer: Medicare Other

## 2022-04-13 ENCOUNTER — Ambulatory Visit: Payer: Medicare Other | Admitting: Internal Medicine

## 2022-04-13 DIAGNOSIS — I10 Essential (primary) hypertension: Secondary | ICD-10-CM | POA: Diagnosis not present

## 2022-04-13 DIAGNOSIS — R0789 Other chest pain: Secondary | ICD-10-CM | POA: Diagnosis not present

## 2022-04-13 DIAGNOSIS — N289 Disorder of kidney and ureter, unspecified: Secondary | ICD-10-CM | POA: Diagnosis not present

## 2022-04-13 DIAGNOSIS — M129 Arthropathy, unspecified: Secondary | ICD-10-CM | POA: Diagnosis not present

## 2022-04-13 DIAGNOSIS — Z7901 Long term (current) use of anticoagulants: Secondary | ICD-10-CM | POA: Diagnosis not present

## 2022-04-13 LAB — CBC
HCT: 32.8 % — ABNORMAL LOW (ref 36.0–46.0)
Hemoglobin: 11.5 g/dL — ABNORMAL LOW (ref 12.0–15.0)
MCH: 33.4 pg (ref 26.0–34.0)
MCHC: 35.1 g/dL (ref 30.0–36.0)
MCV: 95.3 fL (ref 80.0–100.0)
Platelets: 199 10*3/uL (ref 150–400)
RBC: 3.44 MIL/uL — ABNORMAL LOW (ref 3.87–5.11)
RDW: 13.3 % (ref 11.5–15.5)
WBC: 8.3 10*3/uL (ref 4.0–10.5)
nRBC: 0 % (ref 0.0–0.2)

## 2022-04-13 LAB — BASIC METABOLIC PANEL
Anion gap: 7 (ref 5–15)
BUN: 30 mg/dL — ABNORMAL HIGH (ref 8–23)
CO2: 23 mmol/L (ref 22–32)
Calcium: 8.7 mg/dL — ABNORMAL LOW (ref 8.9–10.3)
Chloride: 105 mmol/L (ref 98–111)
Creatinine, Ser: 1.33 mg/dL — ABNORMAL HIGH (ref 0.44–1.00)
GFR, Estimated: 39 mL/min — ABNORMAL LOW (ref >60)
Glucose, Bld: 95 mg/dL (ref 70–99)
Potassium: 4.9 mmol/L (ref 3.5–5.1)
Sodium: 135 mmol/L (ref 135–145)

## 2022-04-13 LAB — PROTIME-INR
INR: 3.3 — ABNORMAL HIGH (ref 0.8–1.2)
Prothrombin Time: 33.2 seconds — ABNORMAL HIGH (ref 11.4–15.2)

## 2022-04-13 LAB — MAGNESIUM: Magnesium: 1.8 mg/dL (ref 1.7–2.4)

## 2022-04-13 MED ORDER — WARFARIN SODIUM 2 MG PO TABS
2.0000 mg | ORAL_TABLET | Freq: Once | ORAL | Status: AC
  Administered 2022-04-13: 2 mg via ORAL
  Filled 2022-04-13: qty 1

## 2022-04-13 MED ORDER — SODIUM CHLORIDE 0.9 % IV SOLN
1.0000 g | INTRAVENOUS | Status: DC
  Administered 2022-04-13 – 2022-04-14 (×2): 1 g via INTRAVENOUS
  Filled 2022-04-13 (×2): qty 10

## 2022-04-13 NOTE — Progress Notes (Signed)
[place] for warfarin Indication: atrial fibrillation  Allergies  Allergen Reactions   Iodinated Contrast Media Shortness Of Breath and Other (See Comments)    "Allergic," per MAR- Causes headaches, also   Zosyn [Piperacillin Sod-Tazobactam So] Other (See Comments)    "Allergic," per [facility]   Penicillins Other (See Comments)    Tolerated Zosyn Oct 2018, but "Allergic," per [facility] Did it involve swelling of the face/tongue/throat, SOB, or low BP? Unk Did it involve sudden or severe rash/hives, skin peeling, or any reaction on the inside of your mouth or nose? Unk Did you need to seek medical attention at a hospital or doctor's office? Unk When did it last happen? Unk If all above answers are "NO", may proceed with cephalosporin use.     Patient Measurements: Height: '5\' 9"'$  (175.3 cm) Weight: 91.3 kg (201 lb 3.2 oz) IBW/kg (Calculated) : 66.2   Vital Signs: Temp: 98.7 F (37.1 C) (10/31 0451) Temp Source: Oral (10/31 0451) BP: 142/61 (10/31 0451) Pulse Rate: 68 (10/31 0451)  Labs: Recent Labs    04/10/22 0819 04/10/22 1851 04/11/22 0607 04/12/22 0108 04/13/22 0251  HGB 13.9  --   --  11.9* 11.5*  HCT 39.4  --   --  33.8* 32.8*  PLT 217  --   --  183 199  LABPROT  --   --  32.4* 38.7* 33.2*  INR  --   --  3.2* 4.0* 3.3*  CREATININE 1.21*  --   --  2.14* 1.33*  TROPONINIHS 16 15  --   --   --      Estimated Creatinine Clearance: 35.8 mL/min (A) (by C-G formula based on SCr of 1.33 mg/dL (H)).   Medical History: Past Medical History:  Diagnosis Date   Anemia    history of   Anxiety    Arthritis    "left knee" (04/05/2017)   Atrial fibrillation (HCC)    Atrial flutter ([place])    typical appearing   Atrial tachycardia    ablated 11/17/10  by JA  from the [facility] of the aorta   CHF (congestive heart failure) (HCC)    Chronic lower back pain    Chronic nausea    Endometrial cancer (HCC)    grade 1   Gallstone pancreatitis     Gastritis    GERD (gastroesophageal reflux disease)    History of hiatal hernia    HTN (hypertension)    Hyperlipemia    Hypothyroidism    Obesity    Osteoporosis    Toe ulcer ([place])    left 3rd toe   Vitamin D deficiency      Assessment: 76 YOF presenting with CP, in afib RVR, hx of afib on warfarin PTA (last dose 10/27).  INR elevated yesterday, warfarin held. Today INR trending down but remains above goal at 3.3  PTA dosing: '5mg'$  MWF, 2.5 mg all other days  Goal of Therapy:  INR 2-3 Monitor platelets by anticoagulation protocol: Yes   Plan:  -Warfarin '2mg'$  PO x1 tonight - reduced dose from home regimen -Daily INR  Arrie Senate, PharmD, BCPS, [facility] Clinical Pharmacist [phone] Please check AMION for all [facility] Pharmacy numbers 04/13/2022

## 2022-04-13 NOTE — Progress Notes (Signed)
PROGRESS NOTE    [name]  QAS:[id number] DOB: [birth year] DOA: 04/10/2022 PCP: Isaac Bliss, Rayford Halsted, MD    Brief Narrative:  Charlotte Castro is a 88 years old female with past medical history of hypertension, hyperlipidemia, atrial fibrillation flutter on Coumadin, CHF, hypothyroidism, anxiety, osteoarthritis, chronic nausea, GERD presented to hospital with back and chest pain with multiple other symptoms including shortness of breath, palpitation tremors.  Patient did have tremors for over 5 years.  Also complains of presyncopal symptoms and dizziness and has been taking frequent breaks.  Has chronic leg swelling which is unchanged.  Previously presented to ED on on 10/19 with similar complaints but ultimately was discharged home without any medications.  On this admission, patient was afebrile.  Had atrial fibrillation with rate in the 150s.  Labs were significant for WBC at 13.  Creatinine at 1.2.  INR was 3.2.  Troponin 15 followed by 16.  Chest x-ray showed a large hiatal hernia.  Patient was then started on Cardizem drip and was admitted to hospital for further evaluation and treatment.  Assessment and Plan:   Principal Problem:   Orthostatic hypertension Active Problems:   Chest pain   Permanent atrial fibrillation (HCC)   Long term current use of anticoagulant therapy   Supratherapeutic INR   Arthritis, multiple joint involvement   Radiculopathy   Renal insufficiency   Nausea and vomiting   Chronic diastolic CHF (congestive heart failure) (HCC)   Essential hypertension, benign   Urinary frequency   Hypothyroidism   Leukocytosis   Atypical chest pain Multiple site pain including chest pain, back pain with anxiety, hiatal hernia..  Troponin normal.  EKG without any concern for ischemic changes.  Possibility of musculoskeletal pain.  High-sensitivity negative x2.  EKG without any clear concerning features.  2D echocardiogram showed LV ejection fraction of 60 to 65% with  no regional wall motion abnormality.  Right ventricular systolic pressure was 32.  PPI has been recommended by cardiology for concerns of hiatal hernia.  Denies overt chest pain today.  Permanent atrial fibrillation with RVR/atrial flutter on chronic anticoagulation supratherapeutic INR Now in sinus rhythm.  On metoprolol as outpatient 25 mg twice a day.  Could increase to 50 mg if blood pressure stable.   TSH of 0.6. Continue Coumadin as per pharmacy.  Leukocytosis/possible UTI Likely reactive in nature.  Initial white count of 13 K now at  8.3.  Chest x-ray without any acute findings.  Negative for COVID.  Urinalysis with significant white cells and nitrite.  We will add Rocephin IV since the patient was symptomatic  Arthritis with multiple joint involvement  radiculopathy Patient presented with improved joint and body part pain.  MRI of the cervical spine 01/2021 noted multilevel disc degeneration and spine in the cervical spine with spinal and foraminal stenosis.  Patient was given lidocaine patch. Prior back surgey several years ago.  Continue gabapentin and tramadol.  X-ray of the cervical and thoracic spine without fractures.  Physical therapy has recommended skilled nursing facility placement at this time.  Acute kidney injury on chronic kidney disease stage IIIa Creatinine 1.21 with BUN 13 on admission.  Baseline creatinine previously noted to be around 0.98.  Creatinine on 04/12/2022 at 2.1 which has improved to 1.3 today.  Continue intake and output charting Daily weights.  Patient was encouraged oral fluids.  Avoid nephrotoxic medication.  Continue normal saline due to orthostatic hypotension and AKI for 1 more day.  Reassess fluid status in AM.  Nausea and vomiting   Continue as needed antiemetics.  Improved at this time.   Chronic diastolic CHF  Last 2D echocardiogram with preserved LV function.  Continue to monitor intake and output charting, Daily weights IV fluids.  Assess fluid  status in AM.   Essential hypertension, now with orthostatic hypotension. Continue metoprolol.  Blood pressure slightly elevated but patient was noted to be orthostatic.  On IV fluids, TED hose.  Continue PT OT.   Urinary frequency Urinalysis positive for nitrate and WBC.  Will add Rocephin IV for 3 days..   Hypothyroidism Continue Synthroid.  TSH of 0.6 at this time.   Large hiatal hernia Chronic.  Chest x-ray notes unchanged large hiatal hernia.  Continue Protonix and elevate head of bed.  Debility, weakness, deconditioning.  Physical/ occupational Therapy has recommended skilled nursing facility placement at this time.    DVT prophylaxis: Place TED hose Start: 04/12/22 1417Warfarin. warfarin (COUMADIN) tablet 2 mg   Code Status:     Code Status: Full Code  Disposition: Skilled nursing facility as per PT evaluation.  Likely on 04/14/2022  Status is: Inpatient  Remains inpatient appropriate because: Orthostatic hypotension,  need for rehabilitation, IV fluids,   Family Communication:  Spoke with the patient's daughter on the phone and updated her about the clinical condition of the patient on 04/12/2022.  Consultants:  Cardiology  Procedures:  None  Antimicrobials:  None  Anti-infectives (From admission, onward)    None      Subjective: Today, patient was seen and examined at bedside.  Feels little better today.  Denies overt pain.  Complains of anxiety tremors.  No fever chills or rigor.  Objective: Vitals:   04/12/22 1643 04/12/22 2007 04/13/22 0025 04/13/22 0451  BP: 132/67 123/63 (!) 142/60 (!) 142/61  Pulse: 69 74 60 68  Resp: '17 17 16 14  '$ Temp: (!) 97.3 F (36.3 C) 98 F (36.7 C) 98.7 F (37.1 C) 98.7 F (37.1 C)  TempSrc: Oral Oral Oral Oral  SpO2: 100% 100%  99%  Weight:    91.3 kg  Height:        Intake/Output Summary (Last 24 hours) at 04/13/2022 0821 Last data filed at 04/13/2022 7062 Gross per 24 hour  Intake 2234.34 ml  Output 800 ml   Net 1434.34 ml    Filed Weights   04/11/22 0616 04/13/22 0451  Weight: 88 kg 91.3 kg    Physical Examination: Body mass index is 29.71 kg/m.   General:  Average built, not in obvious distress, mildly anxious HENT:   No scleral pallor or icterus noted. Oral mucosa is moist.  Tenderness over the cervical spine Chest:    Diminished breath sounds bilaterally. No crackles or wheezes.  CVS: S1 &S2 heard. No murmur.  Regular rate and rhythm. Abdomen: Soft, nontender, nondistended.  Bowel sounds are heard.   Extremities: No cyanosis, clubbing trace pedal edema peripheral pulses are palpable. Psych: Alert, awake and oriented, anxious, tremors noted. CNS:  No cranial nerve deficits.  Moving all extremities Skin: Warm and dry.  No rashes noted.   Data Reviewed:   CBC: Recent Labs  Lab 04/10/22 0819 04/12/22 0108 04/13/22 0251  WBC 13.0* 10.3 8.3  HGB 13.9 11.9* 11.5*  HCT 39.4 33.8* 32.8*  MCV 94.5 94.4 95.3  PLT 217 183 199     Basic Metabolic Panel: Recent Labs  Lab 04/10/22 0819 04/11/22 1237 04/12/22 0108 04/13/22 0251  NA 138  --  137 135  K 3.8  --  4.7 4.9  CL 105  --  106 105  CO2 23  --  24 23  GLUCOSE 138*  --  109* 95  BUN 13  --  38* 30*  CREATININE 1.21*  --  2.14* 1.33*  CALCIUM 9.0  --  9.0 8.7*  MG  --  1.8  --  1.8     Liver Function Tests: No results for input(s): "AST", "ALT", "ALKPHOS", "BILITOT", "PROT", "ALBUMIN" in the last 168 hours.   Radiology Studies: ECHOCARDIOGRAM COMPLETE  Result Date: 04/12/2022    ECHOCARDIOGRAM REPORT   Patient Name:   LASHIA NIESE Date of Exam: 04/12/2022 Medical Rec #:  [id number]      Height:       69.0 in Accession #:    [id number]     Weight:       194.0 lb Date of Birth:  Apr 08, 1933      BSA:          2.039 m Patient Age:    26 years       BP:           144/65 mmHg Patient Gender: F              HR:           73 bpm. Exam Location:  Inpatient Procedure: 2D Echo, Cardiac Doppler, Color Doppler and  Intracardiac            Opacification Agent Indications:    R07.9* Chest pain, unspecified  History:        Patient has prior history of Echocardiogram examinations, most                 recent 07/15/2021. CHF, Arrythmias:Atrial Fibrillation, Atrial                 Flutter and Tachycardia; Risk Factors:Hypertension and                 Dyslipidemia.  Sonographer:    Bernadene Person RDCS Referring Phys: [id number] RONDELL A SMITH  Sonographer Comments: Technically difficult study due to poor echo windows. Image acquisition challenging due to respiratory motion. IMPRESSIONS  1. Left ventricular ejection fraction, by estimation, is 60 to 65%. The left ventricle has normal function. The left ventricle has no regional wall motion abnormalities. There is mild left ventricular hypertrophy. Left ventricular diastolic parameters are indeterminate.  2. Right ventricular systolic function is normal. The right ventricular size is normal. There is normal pulmonary artery systolic pressure. The estimated right ventricular systolic pressure is 88.3 mmHg.  3. Left atrial size was moderately dilated.  4. The mitral valve is degenerative. Mild to moderate mitral valve regurgitation, seen well in only one view due to image quality. No evidence of mitral stenosis. Moderate mitral annular calcification.  5. The aortic valve is abnormal. There is moderate calcification of the aortic valve. Aortic valve regurgitation is trivial. No aortic stenosis is present.  6. The inferior vena cava is normal in size with greater than 50% respiratory variability, suggesting right atrial pressure of 3 mmHg. FINDINGS  Left Ventricle: Left ventricular ejection fraction, by estimation, is 60 to 65%. The left ventricle has normal function. The left ventricle has no regional wall motion abnormalities. Definity contrast agent was given IV to delineate the left ventricular  endocardial borders. The left ventricular internal cavity size was normal in size. There is  mild left ventricular hypertrophy. Left ventricular diastolic parameters are indeterminate. Right Ventricle: The right ventricular size  is normal. No increase in right ventricular wall thickness. Right ventricular systolic function is normal. There is normal pulmonary artery systolic pressure. The tricuspid regurgitant velocity is 2.73 m/s, and  with an assumed right atrial pressure of 3 mmHg, the estimated right ventricular systolic pressure is 32.9 mmHg. Left Atrium: Left atrial size was moderately dilated. Right Atrium: Right atrial size was normal in size. Pericardium: There is no evidence of pericardial effusion. Mitral Valve: The mitral valve is degenerative in appearance. Moderate mitral annular calcification. Mild to moderate mitral valve regurgitation. No evidence of mitral valve stenosis. Tricuspid Valve: The tricuspid valve is normal in structure. Tricuspid valve regurgitation is trivial. No evidence of tricuspid stenosis. Aortic Valve: The aortic valve is abnormal. There is moderate calcification of the aortic valve. Aortic valve regurgitation is trivial. No aortic stenosis is present. Pulmonic Valve: The pulmonic valve was not well visualized. Pulmonic valve regurgitation is not visualized. No evidence of pulmonic stenosis. Aorta: The ascending aorta was not well visualized and the aortic root is normal in size and structure. Venous: The inferior vena cava is normal in size with greater than 50% respiratory variability, suggesting right atrial pressure of 3 mmHg. IAS/Shunts: No atrial level shunt detected by color flow Doppler.  LEFT VENTRICLE PLAX 2D LVIDd:         4.50 cm      Diastology LVIDs:         2.80 cm      LV e' medial:    10.30 cm/s LV PW:         1.00 cm      LV E/e' medial:  9.6 LV IVS:        1.00 cm      LV e' lateral:   11.80 cm/s LVOT diam:     1.80 cm      LV E/e' lateral: 8.4 LV SV:         46 LV SV Index:   22 LVOT Area:     2.54 cm  LV Volumes (MOD) LV vol d, MOD A2C: 110.0 ml LV  vol d, MOD A4C: 79.8 ml LV vol s, MOD A2C: 35.0 ml LV vol s, MOD A4C: 31.2 ml LV SV MOD A2C:     75.0 ml LV SV MOD A4C:     79.8 ml LV SV MOD BP:      67.0 ml RIGHT VENTRICLE RV S prime:     11.30 cm/s TAPSE (M-mode): 1.9 cm LEFT ATRIUM             Index        RIGHT ATRIUM           Index LA diam:        5.40 cm 2.65 cm/m   RA Area:     15.60 cm LA Vol (A2C):   87.6 ml 42.95 ml/m  RA Volume:   35.10 ml  17.21 ml/m LA Vol (A4C):   91.0 ml 44.62 ml/m LA Biplane Vol: 92.9 ml 45.55 ml/m  AORTIC VALVE LVOT Vmax:   94.30 cm/s LVOT Vmean:  63.400 cm/s LVOT VTI:    0.179 m  AORTA Ao Root diam: 3.30 cm Ao Asc diam:  3.20 cm MITRAL VALVE               TRICUSPID VALVE MV Area (PHT): 4.39 cm    TR Peak grad:   29.8 mmHg MV Decel Time: 173 msec    TR Vmax:  273.00 cm/s MV E velocity: 99.00 cm/s                            SHUNTS                            Systemic VTI:  0.18 m                            Systemic Diam: 1.80 cm Cherlynn Kaiser MD Electronically signed by Cherlynn Kaiser MD Signature Date/Time: 04/12/2022/11:09:44 AM    Final    DG Lumbar Spine 2-3 Views  Result Date: 04/11/2022 CLINICAL DATA:  Back pain. EXAM: LUMBAR SPINE - 2-3 VIEW COMPARISON:  CT scan of the abdomen and pelvis August 07, 2020. Chest x-rays April 01, 2022 and April 10, 2022. FINDINGS: Disc spacer devices are identified at L4-5 and L5-S1. Anterolisthesis of L4 versus L5 and L5 versus S1 remains measuring 7 mm at the L4-5 level and 10 mm at the L5-S1 level. No other malalignment. There is suggested loss of height at the T9 level. In retrospect, this may be present on the thoracic spine films although this area is poorly evaluated on both studies. This finding was not present on the comparison CT scan or chest x-rays. Multilevel degenerative disc disease most marked at L2-3. Lower lumbar facet degenerative changes. Calcified atherosclerotic change in the abdominal aorta. IMPRESSION: 1. The T9 level is poorly evaluated on  both today's thoracic spine and lumbar spine films. However, there is suspected loss of height not identified on previous studies. Recommend a CT scan of the thoracic spine for better evaluation. 2. Disc spacer devices as above. Anterolisthesis of L4 versus L5 and L5 versus S1 is above. Degenerative changes. Electronically Signed   By: Dorise Bullion III M.D.   On: 04/11/2022 11:42   DG Cervical Spine 2-3Vclearing  Result Date: 04/11/2022 CLINICAL DATA:  Pain. EXAM: LIMITED CERVICAL SPINE FOR TRAUMA CLEARING - 2-3 VIEW COMPARISON:  None Available. FINDINGS: The cervical spine is only well seen through the C6 level on lateral imaging. C7 and T1 are largely obscured by overlapping soft tissues. 3 mm anterolisthesis of C3 versus C4 is unchanged since the January 14, 2021 MRI. There is straightening of normal lordosis. No other malalignment identified. No fractures. Degenerative disc disease is most marked at C4-5 and C5-6. Multilevel uncovertebral degenerative changes identified. The pre odontoid space and prevertebral soft tissues are normal. Lung apices are unremarkable. IMPRESSION: 1. The cervical spine is only well seen through the C6 level on lateral imaging. C7 and T1 are largely obscured by overlapping soft tissues. C7 and T1 could be better assessed with CT imaging if there is concern at these levels. 2. 3 mm anterolisthesis of C3 versus C4 is unchanged. 3. Degenerative disc disease and uncovertebral degenerative changes as above. Electronically Signed   By: Dorise Bullion III M.D.   On: 04/11/2022 11:36   DG Thoracic Spine 2 View  Result Date: 04/11/2022 CLINICAL DATA:  Back pain.  Leg pain. EXAM: THORACIC SPINE 2 VIEWS COMPARISON:  Chest x-ray April 10, 2022 FINDINGS: A large hiatal hernia is again identified. The heart, hila, mediastinum, lungs, and pleura are otherwise unremarkable. The lateral view is limited due to poor penetration. Mild anterior wedging of a midthoracic vertebral body is  unchanged since April 01, 2022 chest x-ray imaging. IMPRESSION: The lateral  view is markedly limited due to lack of penetration. Anterior wedging of a midthoracic vertebral body appears to be stable. If there is concern for fracture, recommend CT imaging for better evaluation. Electronically Signed   By: Dorise Bullion III M.D.   On: 04/11/2022 11:33      LOS: 1 day    Flora Lipps, MD [facility] Available via Epic secure chat 7am-7pm After these hours, please refer to coverage provider listed on [email or web address] 04/13/2022, 8:21 AM

## 2022-04-13 NOTE — Consult Note (Signed)
   [facility] CM Inpatient Consult   04/13/2022  Charlotte Castro 26-Jun-1932 [id number]  Follow up:  Referral following  Patient for possible SNF tomorrow. Spoke with patient at bedside.  Sitting up in recliner.  She was encouraging and states, "Keep on going and staying healthy, cause being sick and old is no joke."  Explained the follow up anticipated if she goes to a [facility] affiliated facility. She expresses gratitude and she states her son is supportive.  She endorses her PCP at [facility].  Plan: Will continue to follow and send information for [facility] Readmission report.  Will alert [facility] PAC RN follow if she transitions to a [facility] affiliated SNF.  For questions,  04/14/22 1210 pm: Late entry - Secure chat from inpatient [facility] LCSW regarding waiver, confirmed [facility] eligibility and reached out to [facility] RN to ascertain email was received.   Natividad Brood, RN BSN [place]  [phone] business mobile phone Toll free office [phone]  *[place]  [phone] Fax number: [phone] [email or web address]

## 2022-04-13 NOTE — TOC Progression Note (Signed)
Transition of Care [facility]) - Progression Note    Patient Details  Name: Charlotte Castro MRN: [id number] Date of Birth: [birth year]  Transition of Care [facility]) CM/SW [place], [place] Phone Number: 04/13/2022, 1:58 PM  Clinical Narrative:     CSW provided patient with SNF bed offers. Patient wanted for CSW to call her son to help with SNF choice. CSW called patients son Charlotte Castro regarding SNF choice. Charlotte Castro accepted SNF bed offer for patient with [facility]. Nicki with [facility] confirmed SNF bed for patient tomorrow if medically ready. CSW informed MD. CSW will continue to follow and assist with patients dc planning needs.   Expected Discharge Plan: [place] Barriers to Discharge: Continued Medical Work up  Expected Discharge Plan and Services Expected Discharge Plan: [place] In-house Referral: Clinical Social Work     Living arrangements for the past 2 months: Single Family Home                                       Social Determinants of Health (SDOH) Interventions    Readmission Risk Interventions     No data to display

## 2022-04-13 NOTE — Progress Notes (Signed)
Progress Note  Patient Name: SAYLOR MURRY Date of Encounter: 04/13/2022  Primary Cardiologist: Larae Grooms, MD   Subjective   Patient seen and examined at her bedside.  She offers no complaints at this time.  She was awake in bed when I arrived.  Inpatient Medications    Scheduled Meds:  gabapentin  100 mg Oral BID   levothyroxine  88 mcg Oral QAC breakfast   lidocaine  3 patch Transdermal Q24H   metoprolol tartrate  25 mg Oral BID   pantoprazole  40 mg Oral Daily   sodium chloride flush  3 mL Intravenous Q12H   traMADol  50 mg Oral Q6H   warfarin  2 mg Oral ONCE-1600   Warfarin - Pharmacist Dosing Inpatient   Does not apply q1600   Continuous Infusions:  sodium chloride 100 mL/hr at 04/13/22 0324   cefTRIAXone (ROCEPHIN)  IV     PRN Meds: acetaminophen **OR** acetaminophen, albuterol, hydrALAZINE, ondansetron **OR** ondansetron (ZOFRAN) IV   Vital Signs    Vitals:   04/12/22 1643 04/12/22 2007 04/13/22 0025 04/13/22 0451  BP: 132/67 123/63 (!) 142/60 (!) 142/61  Pulse: 69 74 60 68  Resp: '17 17 16 14  '$ Temp: (!) 97.3 F (36.3 C) 98 F (36.7 C) 98.7 F (37.1 C) 98.7 F (37.1 C)  TempSrc: Oral Oral Oral Oral  SpO2: 100% 100%  99%  Weight:    91.3 kg  Height:        Intake/Output Summary (Last 24 hours) at 04/13/2022 0838 Last data filed at 04/13/2022 1165 Gross per 24 hour  Intake 2234.34 ml  Output 800 ml  Net 1434.34 ml   Filed Weights   04/11/22 0616 04/13/22 0451  Weight: 88 kg 91.3 kg    Telemetry    Sinus rhythm- Personally Reviewed  ECG    None today- Personally Reviewed  Physical Exam    General: Comfortable, sitting up in a chair Head: Atraumatic, normal size  Eyes: PEERLA, EOMI  Neck: Supple, normal JVD Cardiac: Normal S1, S2; RRR; no murmurs, rubs, or gallops Lungs: Clear to auscultation bilaterally Abd: Soft, nontender, no hepatomegaly  Ext: warm, no edema Musculoskeletal: No deformities, BUE and BLE strength normal  and equal Skin: Warm and dry, no rashes   Neuro: Alert and oriented to person, place, time, and situation, CNII-XII grossly intact, no focal deficits  Psych: Normal mood and affect   Labs    Chemistry Recent Labs  Lab 04/10/22 0819 04/12/22 0108 04/13/22 0251  NA 138 137 135  K 3.8 4.7 4.9  CL 105 106 105  CO2 '23 24 23  '$ GLUCOSE 138* 109* 95  BUN 13 38* 30*  CREATININE 1.21* 2.14* 1.33*  CALCIUM 9.0 9.0 8.7*  GFRNONAA 43* 22* 39*  ANIONGAP '10 7 7     '$ Hematology Recent Labs  Lab 04/10/22 0819 04/12/22 0108 04/13/22 0251  WBC 13.0* 10.3 8.3  RBC 4.17 3.58* 3.44*  HGB 13.9 11.9* 11.5*  HCT 39.4 33.8* 32.8*  MCV 94.5 94.4 95.3  MCH 33.3 33.2 33.4  MCHC 35.3 35.2 35.1  RDW 13.2 13.3 13.3  PLT 217 183 199    Cardiac EnzymesNo results for input(s): "TROPONINI" in the last 168 hours. No results for input(s): "TROPIPOC" in the last 168 hours.   BNPNo results for input(s): "BNP", "PROBNP" in the last 168 hours.   DDimer No results for input(s): "DDIMER" in the last 168 hours.   Radiology    ECHOCARDIOGRAM COMPLETE  Result  Date: 04/12/2022    ECHOCARDIOGRAM REPORT   Patient Name:   CAMILLA SKEEN Date of Exam: 04/12/2022 Medical Rec #:  [id number]      Height:       69.0 in Accession #:    [id number]     Weight:       194.0 lb Date of Birth:  Apr 01, 1933      BSA:          2.039 m Patient Age:    43 years       BP:           144/65 mmHg Patient Gender: F              HR:           73 bpm. Exam Location:  Inpatient Procedure: 2D Echo, Cardiac Doppler, Color Doppler and Intracardiac            Opacification Agent Indications:    R07.9* Chest pain, unspecified  History:        Patient has prior history of Echocardiogram examinations, most                 recent 07/15/2021. CHF, Arrythmias:Atrial Fibrillation, Atrial                 Flutter and Tachycardia; Risk Factors:Hypertension and                 Dyslipidemia.  Sonographer:    Bernadene Person RDCS Referring Phys: [id number] RONDELL  A SMITH  Sonographer Comments: Technically difficult study due to poor echo windows. Image acquisition challenging due to respiratory motion. IMPRESSIONS  1. Left ventricular ejection fraction, by estimation, is 60 to 65%. The left ventricle has normal function. The left ventricle has no regional wall motion abnormalities. There is mild left ventricular hypertrophy. Left ventricular diastolic parameters are indeterminate.  2. Right ventricular systolic function is normal. The right ventricular size is normal. There is normal pulmonary artery systolic pressure. The estimated right ventricular systolic pressure is 73.4 mmHg.  3. Left atrial size was moderately dilated.  4. The mitral valve is degenerative. Mild to moderate mitral valve regurgitation, seen well in only one view due to image quality. No evidence of mitral stenosis. Moderate mitral annular calcification.  5. The aortic valve is abnormal. There is moderate calcification of the aortic valve. Aortic valve regurgitation is trivial. No aortic stenosis is present.  6. The inferior vena cava is normal in size with greater than 50% respiratory variability, suggesting right atrial pressure of 3 mmHg. FINDINGS  Left Ventricle: Left ventricular ejection fraction, by estimation, is 60 to 65%. The left ventricle has normal function. The left ventricle has no regional wall motion abnormalities. Definity contrast agent was given IV to delineate the left ventricular  endocardial borders. The left ventricular internal cavity size was normal in size. There is mild left ventricular hypertrophy. Left ventricular diastolic parameters are indeterminate. Right Ventricle: The right ventricular size is normal. No increase in right ventricular wall thickness. Right ventricular systolic function is normal. There is normal pulmonary artery systolic pressure. The tricuspid regurgitant velocity is 2.73 m/s, and  with an assumed right atrial pressure of 3 mmHg, the estimated right  ventricular systolic pressure is 19.3 mmHg. Left Atrium: Left atrial size was moderately dilated. Right Atrium: Right atrial size was normal in size. Pericardium: There is no evidence of pericardial effusion. Mitral Valve: The mitral valve is degenerative in appearance. Moderate mitral annular calcification. Mild to moderate  mitral valve regurgitation. No evidence of mitral valve stenosis. Tricuspid Valve: The tricuspid valve is normal in structure. Tricuspid valve regurgitation is trivial. No evidence of tricuspid stenosis. Aortic Valve: The aortic valve is abnormal. There is moderate calcification of the aortic valve. Aortic valve regurgitation is trivial. No aortic stenosis is present. Pulmonic Valve: The pulmonic valve was not well visualized. Pulmonic valve regurgitation is not visualized. No evidence of pulmonic stenosis. Aorta: The ascending aorta was not well visualized and the aortic root is normal in size and structure. Venous: The inferior vena cava is normal in size with greater than 50% respiratory variability, suggesting right atrial pressure of 3 mmHg. IAS/Shunts: No atrial level shunt detected by color flow Doppler.  LEFT VENTRICLE PLAX 2D LVIDd:         4.50 cm      Diastology LVIDs:         2.80 cm      LV e' medial:    10.30 cm/s LV PW:         1.00 cm      LV E/e' medial:  9.6 LV IVS:        1.00 cm      LV e' lateral:   11.80 cm/s LVOT diam:     1.80 cm      LV E/e' lateral: 8.4 LV SV:         46 LV SV Index:   22 LVOT Area:     2.54 cm  LV Volumes (MOD) LV vol d, MOD A2C: 110.0 ml LV vol d, MOD A4C: 79.8 ml LV vol s, MOD A2C: 35.0 ml LV vol s, MOD A4C: 31.2 ml LV SV MOD A2C:     75.0 ml LV SV MOD A4C:     79.8 ml LV SV MOD BP:      67.0 ml RIGHT VENTRICLE RV S prime:     11.30 cm/s TAPSE (M-mode): 1.9 cm LEFT ATRIUM             Index        RIGHT ATRIUM           Index LA diam:        5.40 cm 2.65 cm/m   RA Area:     15.60 cm LA Vol (A2C):   87.6 ml 42.95 ml/m  RA Volume:   35.10 ml  17.21  ml/m LA Vol (A4C):   91.0 ml 44.62 ml/m LA Biplane Vol: 92.9 ml 45.55 ml/m  AORTIC VALVE LVOT Vmax:   94.30 cm/s LVOT Vmean:  63.400 cm/s LVOT VTI:    0.179 m  AORTA Ao Root diam: 3.30 cm Ao Asc diam:  3.20 cm MITRAL VALVE               TRICUSPID VALVE MV Area (PHT): 4.39 cm    TR Peak grad:   29.8 mmHg MV Decel Time: 173 msec    TR Vmax:        273.00 cm/s MV E velocity: 99.00 cm/s                            SHUNTS                            Systemic VTI:  0.18 m  Systemic Diam: 1.80 cm Cherlynn Kaiser MD Electronically signed by Cherlynn Kaiser MD Signature Date/Time: 04/12/2022/11:09:44 AM    Final    DG Lumbar Spine 2-3 Views  Result Date: 04/11/2022 CLINICAL DATA:  Back pain. EXAM: LUMBAR SPINE - 2-3 VIEW COMPARISON:  CT scan of the abdomen and pelvis August 07, 2020. Chest x-rays April 01, 2022 and April 10, 2022. FINDINGS: Disc spacer devices are identified at L4-5 and L5-S1. Anterolisthesis of L4 versus L5 and L5 versus S1 remains measuring 7 mm at the L4-5 level and 10 mm at the L5-S1 level. No other malalignment. There is suggested loss of height at the T9 level. In retrospect, this may be present on the thoracic spine films although this area is poorly evaluated on both studies. This finding was not present on the comparison CT scan or chest x-rays. Multilevel degenerative disc disease most marked at L2-3. Lower lumbar facet degenerative changes. Calcified atherosclerotic change in the abdominal aorta. IMPRESSION: 1. The T9 level is poorly evaluated on both today's thoracic spine and lumbar spine films. However, there is suspected loss of height not identified on previous studies. Recommend a CT scan of the thoracic spine for better evaluation. 2. Disc spacer devices as above. Anterolisthesis of L4 versus L5 and L5 versus S1 is above. Degenerative changes. Electronically Signed   By: Dorise Bullion III M.D.   On: 04/11/2022 11:42   DG Cervical Spine  2-3Vclearing  Result Date: 04/11/2022 CLINICAL DATA:  Pain. EXAM: LIMITED CERVICAL SPINE FOR TRAUMA CLEARING - 2-3 VIEW COMPARISON:  None Available. FINDINGS: The cervical spine is only well seen through the C6 level on lateral imaging. C7 and T1 are largely obscured by overlapping soft tissues. 3 mm anterolisthesis of C3 versus C4 is unchanged since the January 14, 2021 MRI. There is straightening of normal lordosis. No other malalignment identified. No fractures. Degenerative disc disease is most marked at C4-5 and C5-6. Multilevel uncovertebral degenerative changes identified. The pre odontoid space and prevertebral soft tissues are normal. Lung apices are unremarkable. IMPRESSION: 1. The cervical spine is only well seen through the C6 level on lateral imaging. C7 and T1 are largely obscured by overlapping soft tissues. C7 and T1 could be better assessed with CT imaging if there is concern at these levels. 2. 3 mm anterolisthesis of C3 versus C4 is unchanged. 3. Degenerative disc disease and uncovertebral degenerative changes as above. Electronically Signed   By: Dorise Bullion III M.D.   On: 04/11/2022 11:36   DG Thoracic Spine 2 View  Result Date: 04/11/2022 CLINICAL DATA:  Back pain.  Leg pain. EXAM: THORACIC SPINE 2 VIEWS COMPARISON:  Chest x-ray April 10, 2022 FINDINGS: A large hiatal hernia is again identified. The heart, hila, mediastinum, lungs, and pleura are otherwise unremarkable. The lateral view is limited due to poor penetration. Mild anterior wedging of a midthoracic vertebral body is unchanged since April 01, 2022 chest x-ray imaging. IMPRESSION: The lateral view is markedly limited due to lack of penetration. Anterior wedging of a midthoracic vertebral body appears to be stable. If there is concern for fracture, recommend CT imaging for better evaluation. Electronically Signed   By: Dorise Bullion III M.D.   On: 04/11/2022 11:33    Cardiac Studies   Nuclear stress test  2018 IMPRESSION: 1. No reversible ischemia or infarction.   2. Normal left ventricular wall motion.   3. Left ventricular ejection fraction 68%   4. Non invasive risk stratification*: Low   Echocardiogram: 07/2021  IMPRESSIONS   1. Left ventricular ejection fraction, by estimation, is 60 to 65%. The  left ventricle has normal function. The left ventricle has no regional  wall motion abnormalities. There is mild concentric left ventricular  hypertrophy. Left ventricular diastolic  parameters are indeterminate. The average left ventricular global  longitudinal strain is -15.9 %. The global longitudinal strain is normal.   2. Right ventricular systolic function is normal. The right ventricular  size is normal. There is normal pulmonary artery systolic pressure. The  estimated right ventricular systolic pressure is 00.3 mmHg.   3. Left atrial size was mildly dilated.   4. The mitral valve is normal in structure. Mild mitral valve  regurgitation. No evidence of mitral stenosis.   5. The aortic valve is calcified. Aortic valve regurgitation is mild.  Aortic valve sclerosis/calcification is present, without any evidence of  aortic stenosis.   6. The inferior vena cava is normal in size with greater than 50%  respiratory variability, suggesting right atrial pressure of 3 mmHg.   Patient Profile     88 y.o. female with a hx of chest pain (normal cors by cath in 2012 and low-risk NST in 05/2017), paroxysmal atrial fibrillation/atrial tachycardia, HTN, HLD, hypothyroidism and history of endometrial cancer.  Assessment & Plan    Atypical chest pain Paroxysmal atrial flutter/fibrillation Hypertension Stage III chronic kidney disease   No report of chest pain this morning .  Her features of her chest pain/shoulder pain that has been described sounded more atypical.  Suspected that it could be in the setting of a hiatal hernia.  She is tolerating the Protonix 40 mg daily.  Her  high-sensitivity troponin has been negative.  EKG with no acute ischemic changes.  We will continue to monitor the patient her echocardiogram is pending should she have significant depressed EF or wall motion abnormality we can pursue an ischemic evaluation if none of these are present we will recommend continuing her current medication regimen including her Protonix.  Continue her Lopressor 25 mg twice daily.  Her blood pressure is variable so we will have to monitor if needed can increase the Lopressor to 50 mg twice daily.  Conitnue coumadin - INR managed by pharmacy.   Monitor kidney function as patient does have chronic kidney disease avoid nephrotoxin.    For questions or updates, please contact [place] Please consult [email or web address] for contact info under Cardiology/STEMI.      Signed, Berniece Salines, DO  04/13/2022, 8:38 AM

## 2022-04-14 DIAGNOSIS — R0789 Other chest pain: Secondary | ICD-10-CM | POA: Diagnosis not present

## 2022-04-14 DIAGNOSIS — I5032 Chronic diastolic (congestive) heart failure: Secondary | ICD-10-CM | POA: Diagnosis not present

## 2022-04-14 DIAGNOSIS — M5412 Radiculopathy, cervical region: Secondary | ICD-10-CM

## 2022-04-14 DIAGNOSIS — I5189 Other ill-defined heart diseases: Secondary | ICD-10-CM

## 2022-04-14 DIAGNOSIS — M129 Arthropathy, unspecified: Secondary | ICD-10-CM | POA: Diagnosis not present

## 2022-04-14 DIAGNOSIS — I10 Essential (primary) hypertension: Secondary | ICD-10-CM | POA: Diagnosis not present

## 2022-04-14 LAB — CBC
HCT: 34.4 % — ABNORMAL LOW (ref 36.0–46.0)
Hemoglobin: 12.1 g/dL (ref 12.0–15.0)
MCH: 33.4 pg (ref 26.0–34.0)
MCHC: 35.2 g/dL (ref 30.0–36.0)
MCV: 95 fL (ref 80.0–100.0)
Platelets: 214 10*3/uL (ref 150–400)
RBC: 3.62 MIL/uL — ABNORMAL LOW (ref 3.87–5.11)
RDW: 13.1 % (ref 11.5–15.5)
WBC: 7.6 10*3/uL (ref 4.0–10.5)
nRBC: 0 % (ref 0.0–0.2)

## 2022-04-14 LAB — BASIC METABOLIC PANEL
Anion gap: 7 (ref 5–15)
BUN: 20 mg/dL (ref 8–23)
CO2: 24 mmol/L (ref 22–32)
Calcium: 8.9 mg/dL (ref 8.9–10.3)
Chloride: 106 mmol/L (ref 98–111)
Creatinine, Ser: 0.93 mg/dL (ref 0.44–1.00)
GFR, Estimated: 59 mL/min — ABNORMAL LOW (ref >60)
Glucose, Bld: 99 mg/dL (ref 70–99)
Potassium: 4.8 mmol/L (ref 3.5–5.1)
Sodium: 137 mmol/L (ref 135–145)

## 2022-04-14 LAB — PROTIME-INR
INR: 3.2 — ABNORMAL HIGH (ref 0.8–1.2)
Prothrombin Time: 32.7 seconds — ABNORMAL HIGH (ref 11.4–15.2)

## 2022-04-14 LAB — MAGNESIUM: Magnesium: 1.8 mg/dL (ref 1.7–2.4)

## 2022-04-14 MED ORDER — TRAMADOL HCL 50 MG PO TABS: 50.0000 mg | ORAL_TABLET | Freq: Four times a day (QID) | ORAL | 0 refills | Status: DC

## 2022-04-14 MED ORDER — METOPROLOL TARTRATE 50 MG PO TABS
50.0000 mg | ORAL_TABLET | Freq: Two times a day (BID) | ORAL | Status: DC
  Administered 2022-04-14: 50 mg via ORAL
  Filled 2022-04-14: qty 1

## 2022-04-14 MED ORDER — CEPHALEXIN 500 MG PO CAPS: 500.0000 mg | ORAL_CAPSULE | Freq: Three times a day (TID) | ORAL | 0 refills | Status: AC

## 2022-04-14 MED ORDER — PANTOPRAZOLE SODIUM 20 MG PO TBEC: 20.0000 mg | DELAYED_RELEASE_TABLET | Freq: Two times a day (BID) | ORAL | 0 refills | Status: DC

## 2022-04-14 MED ORDER — WARFARIN SODIUM 2 MG PO TABS: 2.0000 mg | ORAL_TABLET | Freq: Once | ORAL | Status: DC

## 2022-04-14 NOTE — TOC Transition Note (Addendum)
Transition of Care [facility]) - CM/SW Discharge Note   Patient Details  Name: Charlotte Castro MRN: [id number] Date of Birth: 1933/06/01  Transition of Care [facility]) CM/SW Contact:  Milas Gain, [place] Phone Number: 04/14/2022, 12:07 PM   Clinical Narrative:     Patient will DC to: [place] date: 04/14/2022  Family notified: Marcello Moores and Psychologist, educational by: Corey Harold   ?  Per MD patient ready for DC to [facility] with 3 day [facility] approved waiver for SNF . RN, patient, patient's family, and facility notified of DC. Discharge Summary sent to facility. RN given number for report tele# [phone]. DC packet on chart. Ambulance transport requested for patient.  CSW signing off.    Final next level of care: Skilled Nursing Facility Barriers to Discharge: No Barriers Identified   Patient Goals and CMS Choice Patient states their goals for this hospitalization and ongoing recovery are:: SNF CMS Medicare.gov Compare Post Acute Care list provided to:: Patient Choice offered to / list presented to : Patient, Adult Children (patient and patients son Marcello Moores)  Discharge Placement              Patient [facility] bed at: [facility] Patient to be transferred to facility by: [place] Name of family member notified: Marcello Moores and Tammie Patient and family notified of of transfer: 04/14/22  Discharge Plan and Services In-house Referral: Clinical Social Work                                   Social Determinants of Health (SDOH) Interventions     Readmission Risk Interventions     No data to display

## 2022-04-14 NOTE — Discharge Summary (Addendum)
Physician Discharge Summary   Patient: Charlotte Castro MRN: [id number] DOB: March 01, 1933  Admit date:     04/10/2022  Discharge date: 04/20/22  Discharge Physician: Corrie Mckusick Anikah Hogge   PCP: Isaac Bliss, Rayford Halsted, MD   Recommendations at discharge:   Follow-up with your primary care provider at the skilled nursing facility in 3 to 5 days.  Check CBC BMP magnesium in the next visit. Follow-up recommended with GI as outpatient.  Discharge Diagnoses: Active Problems:   Chest pain   Permanent atrial fibrillation (HCC)   Long term current use of anticoagulant therapy   Arthritis, multiple joint involvement   Radiculopathy   Chronic diastolic CHF (congestive heart failure) (HCC)   Essential hypertension, benign   Hypothyroidism  Principal Problem (Resolved):   Orthostatic hypotension Resolved Problems:   Supratherapeutic INR   Renal insufficiency   Nausea and vomiting   Urinary frequency   Diastolic chf    Leukocytosis  Hospital Course: Charlotte Castro is a 88 years old female with past medical history of hypertension, hyperlipidemia, atrial fibrillation flutter on Coumadin, CHF, hypothyroidism, anxiety, osteoarthritis, chronic nausea, GERD presented to hospital with back and chest pain with multiple other symptoms including shortness of breath palpitation tremors.  Patient did have tremors for over 5 years.  Also complains of presyncopal symptoms and dizziness and has been taking frequent breaks.  Has chronic leg swelling which is unchanged.  Previously presented to ED on on 10/19 with similar complaints but ultimately was discharged home without any medications.  On this admission patient was afebrile.  Had atrial fibrillation with rate in the 150s.  Labs were significant for WBC at 13.  Creatinine at 1.2.  INR was 3.2.  Troponin 15 followed by 16.  Chest x-ray showed a large hiatal hernia.  Patient was then started on Cardizem drip and was admitted to hospital for further evaluation and  treatment.  The following conditions were addressed during hospitalization,  Atypical chest pain Patient presented with multiple site pain including chest pain, back pain with anxiety, hiatal hernia..  Troponin normal.  EKG without any concern for ischemic changes.  Possibility of musculoskeletal pain.  High-sensitivity negative x2.  EKG without any clear concerning features.  2D echocardiogram showed LV ejection fraction of 60 to 65% with no regional wall motion abnormality.  Right ventricular systolic pressure was 32.  PPI has been recommended by cardiology for concerns of hiatal hernia.  Follow-up with GI recommended as outpatient.   Permanent atrial fibrillation with RVR/atrial flutter on chronic anticoagulation supratherapeutic INR Now in sinus rhythm.  Continue metoprolol 50 twice daily.  Continue Coumadin.  TSH of 0.6.    Leukocytosis/possible UTI Likely reactive in nature.  Initial white count of 13 K now at  8.3.  Chest x-ray without any acute findings.  Negative for COVID.  Urinalysis with significant white cells and nitrite.  Received IV Rocephin while in the hospital.  We will continue Keflex on discharge.  Arthritis with multiple joint involvement  radiculopathy Patient presented with improved joint and body part pain.  MRI of the cervical spine 01/2021 noted multilevel disc degeneration and spine in the cervical spine with spinal and foraminal stenosis.  Prior back surgey several years ago.   X-ray of the cervical and thoracic spine without fractures.  Physical therapy has recommended skilled nursing facility placement at this time.Continue gabapentin and tramadol Lidoderm patch on discharge.    Acute kidney injury on chronic kidney disease stage IIIa Creatinine 1.21 with BUN 13 on  admission.  Baseline creatinine previously noted to be around 0.98.  Creatinine today at 0.9 and at baseline..  Patient was encouraged oral fluids.    Nausea and vomiting Improved.   Chronic diastolic  CHF  Last 2D echocardiogram with preserved LV function.  Remained compensated   Essential hypertension,  orthostatic hypotension. Continue metoprolol.  Would recommend continuation of TED hose.  Continue physical therapy.   Hypothyroidism Continue Synthroid.  TSH of 0.6 at this time.   Large hiatal hernia Chronic.  Chest x-ray notes unchanged large hiatal hernia.  Continue Protonix and elevate head of bed.  GERD precautions.   Debility, weakness, deconditioning.  Physical/ occupational Therapy has recommended skilled nursing facility placement at this time.    Consultants: Cardiology  Procedures performed: None  Disposition: Skilled nursing facility Diet recommendation:  Discharge Diet Orders (From admission, onward)     Start     Ordered   04/14/22 0000  Diet - low sodium heart healthy        04/14/22 0925            DISCHARGE MEDICATION: Allergies as of 04/14/2022       Reactions   Iodinated Contrast Media Shortness Of Breath, Other (See Comments)   "Allergic," per MAR- Causes headaches, also   Zosyn [piperacillin Sod-tazobactam So] Other (See Comments)   "Allergic," per [facility]   Penicillins Other (See Comments)   Tolerated Zosyn Oct 2018, but "Allergic," per [facility] Did it involve swelling of the face/tongue/throat, SOB, or low BP? Unk Did it involve sudden or severe rash/hives, skin peeling, or any reaction on the inside of your mouth or nose? Unk Did you need to seek medical attention at a hospital or doctor's office? Unk When did it last happen? Unk If all above answers are "NO", may proceed with cephalosporin use.        Medication List     TAKE these medications    acetaminophen 650 MG CR tablet Commonly known as: TYLENOL Take 650 mg by mouth 2 (two) times daily.   cholecalciferol 25 MCG (1000 UNIT) tablet Commonly known as: VITAMIN D3 Take 1,000 Units by mouth daily.   cyanocobalamin 1000 MCG tablet Commonly known as: VITAMIN B12 Take 5,000 mcg by  mouth 2 (two) times daily.   gabapentin 100 MG capsule Commonly known as: NEURONTIN TAKE ONE CAPSULE BY MOUTH TWICE DAILY   levothyroxine 88 MCG tablet Commonly known as: SYNTHROID TAKE ONE TABLET BY MOUTH EVERY DAY BEFORE BREAKFAST What changed:  how much to take how to take this when to take this additional instructions   lidocaine 5 % Commonly known as: Lidoderm Place 1 patch onto the skin daily. Remove & Discard patch within 12 hours or as directed by MD   metoprolol tartrate 50 MG tablet Commonly known as: LOPRESSOR TAKE ONE TABLET TWICE DAILY WITH A MEAL What changed: See the new instructions.   ondansetron 8 MG disintegrating tablet Commonly known as: ZOFRAN-ODT '8mg'$  ODT q8 hours prn nausea   pantoprazole 20 MG tablet Commonly known as: PROTONIX Take 1 tablet (20 mg total) by mouth 2 (two) times daily before a meal. What changed:  medication strength how much to take when to take this   traMADol 50 MG tablet Commonly known as: ULTRAM Take 1 tablet (50 mg total) by mouth every 6 (six) hours.   warfarin 5 MG tablet Commonly known as: COUMADIN Take as directed. If you are unsure how to take this medication, talk to your nurse or  doctor. Original instructions: TAKE ONE-HALF TO ONE TABLET BY MOUTH DAILY AS DIRECTED BY COUMADIN CLINIC What changed:  how much to take how to take this when to take this additional instructions       ASK your doctor about these medications    cephALEXin 500 MG capsule Commonly known as: KEFLEX Take 1 capsule (500 mg total) by mouth 3 (three) times daily for 2 days. Ask about: Should I take this medication?        Contact information for follow-up providers     Isaac Bliss, Rayford Halsted, MD. Schedule an appointment as soon as possible for a visit .   Specialty: Internal Medicine Contact information: [place] [address] [phone]         [facility]. Schedule an appointment  as soon as possible for a visit .   Specialty: Gastroenterology Contact information: [address] [place] [address] [phone]             Contact information for after-discharge care     Destination     [facility] Preferred SNF .   Service: Skilled Nursing Contact information: [address] [place] [phone]                    Subjective.  Patient feels better than yesterday.  Has multiple pain issues.  Less anxious.  Discharge Exam: Filed Weights   04/11/22 0616 04/13/22 0451 04/14/22 0630  Weight: 88 kg 91.3 kg 90.1 kg      04/14/2022    9:39 AM 04/14/2022    6:30 AM 04/14/2022   12:19 AM  Vitals with BMI  Weight  198 lbs 10 oz   BMI  70.17   Systolic 793 903 009  Diastolic 53 63 63  Pulse 72 67 70  General:  Average built, not in obvious distress, mildly anxious HENT:   No scleral pallor or icterus noted. Oral mucosa is moist.  Chest:    Diminished breath sounds bilaterally. No crackles or wheezes.  CVS: S1 &S2 heard. No murmur.  Regular rate and rhythm. Abdomen: Soft, nontender, nondistended.  Bowel sounds are heard.   Extremities: No cyanosis, clubbing with trace pedal edema peripheral pulses are palpable. Psych: Alert, awake and oriented mildly anxious CNS:  No cranial nerve deficits.  Power equal in all extremities.   Skin: Warm and dry.  No rashes noted.   Condition at discharge: good  The results of significant diagnostics from this hospitalization (including imaging, microbiology, ancillary and laboratory) are listed below for reference.   Imaging Studies: ECHOCARDIOGRAM COMPLETE  Result Date: 04/12/2022    ECHOCARDIOGRAM REPORT   Patient Name:   Charlotte Castro Date of Exam: 04/12/2022 Medical Rec #:  [id number]      Height:       69.0 in Accession #:    [id number]     Weight:       194.0 lb Date of Birth:  April 09, 1933      BSA:          2.039 m Patient Age:    7  years       BP:           144/65 mmHg Patient Gender: F              HR:           73 bpm. Exam Location:  Inpatient Procedure: 2D Echo, Cardiac Doppler,  Color Doppler and Intracardiac            Opacification Agent Indications:    R07.9* Chest pain, unspecified  History:        Patient has prior history of Echocardiogram examinations, most                 recent 07/15/2021. CHF, Arrythmias:Atrial Fibrillation, Atrial                 Flutter and Tachycardia; Risk Factors:Hypertension and                 Dyslipidemia.  Sonographer:    Bernadene Person RDCS Referring Phys: [id number] RONDELL A SMITH  Sonographer Comments: Technically difficult study due to poor echo windows. Image acquisition challenging due to respiratory motion. IMPRESSIONS  1. Left ventricular ejection fraction, by estimation, is 60 to 65%. The left ventricle has normal function. The left ventricle has no regional wall motion abnormalities. There is mild left ventricular hypertrophy. Left ventricular diastolic parameters are indeterminate.  2. Right ventricular systolic function is normal. The right ventricular size is normal. There is normal pulmonary artery systolic pressure. The estimated right ventricular systolic pressure is 85.8 mmHg.  3. Left atrial size was moderately dilated.  4. The mitral valve is degenerative. Mild to moderate mitral valve regurgitation, seen well in only one view due to image quality. No evidence of mitral stenosis. Moderate mitral annular calcification.  5. The aortic valve is abnormal. There is moderate calcification of the aortic valve. Aortic valve regurgitation is trivial. No aortic stenosis is present.  6. The inferior vena cava is normal in size with greater than 50% respiratory variability, suggesting right atrial pressure of 3 mmHg. FINDINGS  Left Ventricle: Left ventricular ejection fraction, by estimation, is 60 to 65%. The left ventricle has normal function. The left ventricle has no regional wall motion  abnormalities. Definity contrast agent was given IV to delineate the left ventricular  endocardial borders. The left ventricular internal cavity size was normal in size. There is mild left ventricular hypertrophy. Left ventricular diastolic parameters are indeterminate. Right Ventricle: The right ventricular size is normal. No increase in right ventricular wall thickness. Right ventricular systolic function is normal. There is normal pulmonary artery systolic pressure. The tricuspid regurgitant velocity is 2.73 m/s, and  with an assumed right atrial pressure of 3 mmHg, the estimated right ventricular systolic pressure is 85.0 mmHg. Left Atrium: Left atrial size was moderately dilated. Right Atrium: Right atrial size was normal in size. Pericardium: There is no evidence of pericardial effusion. Mitral Valve: The mitral valve is degenerative in appearance. Moderate mitral annular calcification. Mild to moderate mitral valve regurgitation. No evidence of mitral valve stenosis. Tricuspid Valve: The tricuspid valve is normal in structure. Tricuspid valve regurgitation is trivial. No evidence of tricuspid stenosis. Aortic Valve: The aortic valve is abnormal. There is moderate calcification of the aortic valve. Aortic valve regurgitation is trivial. No aortic stenosis is present. Pulmonic Valve: The pulmonic valve was not well visualized. Pulmonic valve regurgitation is not visualized. No evidence of pulmonic stenosis. Aorta: The ascending aorta was not well visualized and the aortic root is normal in size and structure. Venous: The inferior vena cava is normal in size with greater than 50% respiratory variability, suggesting right atrial pressure of 3 mmHg. IAS/Shunts: No atrial level shunt detected by color flow Doppler.  LEFT VENTRICLE PLAX 2D LVIDd:         4.50 cm      Diastology  LVIDs:         2.80 cm      LV e' medial:    10.30 cm/s LV PW:         1.00 cm      LV E/e' medial:  9.6 LV IVS:        1.00 cm      LV e'  lateral:   11.80 cm/s LVOT diam:     1.80 cm      LV E/e' lateral: 8.4 LV SV:         46 LV SV Index:   22 LVOT Area:     2.54 cm  LV Volumes (MOD) LV vol d, MOD A2C: 110.0 ml LV vol d, MOD A4C: 79.8 ml LV vol s, MOD A2C: 35.0 ml LV vol s, MOD A4C: 31.2 ml LV SV MOD A2C:     75.0 ml LV SV MOD A4C:     79.8 ml LV SV MOD BP:      67.0 ml RIGHT VENTRICLE RV S prime:     11.30 cm/s TAPSE (M-mode): 1.9 cm LEFT ATRIUM             Index        RIGHT ATRIUM           Index LA diam:        5.40 cm 2.65 cm/m   RA Area:     15.60 cm LA Vol (A2C):   87.6 ml 42.95 ml/m  RA Volume:   35.10 ml  17.21 ml/m LA Vol (A4C):   91.0 ml 44.62 ml/m LA Biplane Vol: 92.9 ml 45.55 ml/m  AORTIC VALVE LVOT Vmax:   94.30 cm/s LVOT Vmean:  63.400 cm/s LVOT VTI:    0.179 m  AORTA Ao Root diam: 3.30 cm Ao Asc diam:  3.20 cm MITRAL VALVE               TRICUSPID VALVE MV Area (PHT): 4.39 cm    TR Peak grad:   29.8 mmHg MV Decel Time: 173 msec    TR Vmax:        273.00 cm/s MV E velocity: 99.00 cm/s                            SHUNTS                            Systemic VTI:  0.18 m                            Systemic Diam: 1.80 cm Cherlynn Kaiser MD Electronically signed by Cherlynn Kaiser MD Signature Date/Time: 04/12/2022/11:09:44 AM    Final    DG Lumbar Spine 2-3 Views  Result Date: 04/11/2022 CLINICAL DATA:  Back pain. EXAM: LUMBAR SPINE - 2-3 VIEW COMPARISON:  CT scan of the abdomen and pelvis August 07, 2020. Chest x-rays April 01, 2022 and April 10, 2022. FINDINGS: Disc spacer devices are identified at L4-5 and L5-S1. Anterolisthesis of L4 versus L5 and L5 versus S1 remains measuring 7 mm at the L4-5 level and 10 mm at the L5-S1 level. No other malalignment. There is suggested loss of height at the T9 level. In retrospect, this may be present on the thoracic spine films although this area is poorly evaluated on both studies. This finding was not present on the  comparison CT scan or chest x-rays. Multilevel degenerative disc  disease most marked at L2-3. Lower lumbar facet degenerative changes. Calcified atherosclerotic change in the abdominal aorta. IMPRESSION: 1. The T9 level is poorly evaluated on both today's thoracic spine and lumbar spine films. However, there is suspected loss of height not identified on previous studies. Recommend a CT scan of the thoracic spine for better evaluation. 2. Disc spacer devices as above. Anterolisthesis of L4 versus L5 and L5 versus S1 is above. Degenerative changes. Electronically Signed   By: Dorise Bullion III M.D.   On: 04/11/2022 11:42   DG Cervical Spine 2-3Vclearing  Result Date: 04/11/2022 CLINICAL DATA:  Pain. EXAM: LIMITED CERVICAL SPINE FOR TRAUMA CLEARING - 2-3 VIEW COMPARISON:  None Available. FINDINGS: The cervical spine is only well seen through the C6 level on lateral imaging. C7 and T1 are largely obscured by overlapping soft tissues. 3 mm anterolisthesis of C3 versus C4 is unchanged since the January 14, 2021 MRI. There is straightening of normal lordosis. No other malalignment identified. No fractures. Degenerative disc disease is most marked at C4-5 and C5-6. Multilevel uncovertebral degenerative changes identified. The pre odontoid space and prevertebral soft tissues are normal. Lung apices are unremarkable. IMPRESSION: 1. The cervical spine is only well seen through the C6 level on lateral imaging. C7 and T1 are largely obscured by overlapping soft tissues. C7 and T1 could be better assessed with CT imaging if there is concern at these levels. 2. 3 mm anterolisthesis of C3 versus C4 is unchanged. 3. Degenerative disc disease and uncovertebral degenerative changes as above. Electronically Signed   By: Dorise Bullion III M.D.   On: 04/11/2022 11:36   DG Thoracic Spine 2 View  Result Date: 04/11/2022 CLINICAL DATA:  Back pain.  Leg pain. EXAM: THORACIC SPINE 2 VIEWS COMPARISON:  Chest x-ray April 10, 2022 FINDINGS: A large hiatal hernia is again identified. The heart,  hila, mediastinum, lungs, and pleura are otherwise unremarkable. The lateral view is limited due to poor penetration. Mild anterior wedging of a midthoracic vertebral body is unchanged since April 01, 2022 chest x-ray imaging. IMPRESSION: The lateral view is markedly limited due to lack of penetration. Anterior wedging of a midthoracic vertebral body appears to be stable. If there is concern for fracture, recommend CT imaging for better evaluation. Electronically Signed   By: Dorise Bullion III M.D.   On: 04/11/2022 11:33   DG Chest 2 View  Result Date: 04/10/2022 CLINICAL DATA:  Chest and back pain. EXAM: CHEST - 2 VIEW COMPARISON:  04/01/2022.  CT, 10/26/2017. FINDINGS: Cardiac silhouette is normal in size. Large hiatal hernia. No mediastinal or hilar masses. No evidence of adenopathy. Clear lungs.  No pleural effusion or pneumothorax. Skeletal structures are grossly intact. IMPRESSION: 1. No acute cardiopulmonary disease. 2. Large hiatal hernia, unchanged. Electronically Signed   By: Lajean Manes M.D.   On: 04/10/2022 09:20   DG Chest 2 View  Result Date: 04/01/2022 CLINICAL DATA:  Chest pain and shortness of breath. EXAM: CHEST - 2 VIEW COMPARISON:  Chest radiographs 01/13/2021, CT chest 10/25/2017 FINDINGS: An air-filled oval structure overlying the inferior medial right hemithorax corresponds to the large hiatal hernia/intrathoracic stomach seen on prior CT. Cardiac silhouette and mediastinal contours otherwise within normal limits. Mild chronic bilateral interstitial thickening is unchanged. No pleural effusion or pneumothorax. Mild multilevel degenerative disc changes of the thoracic spine. Cholecystectomy clips. IMPRESSION: 1. No acute cardiopulmonary process. 2. Large hiatal hernia/intrathoracic stomach, as seen on multiple  prior studies including prior CT. Electronically Signed   By: Yvonne Kendall M.D.   On: 04/01/2022 14:45    Microbiology: Results for orders placed or performed during  the hospital encounter of 04/10/22  SARS Coronavirus 2 by RT PCR (hospital order, performed in [facility] hospital lab) *cepheid single result test* Anterior Nasal Swab     Status: None   Collection Time: 04/11/22  1:24 PM   Specimen: Anterior Nasal Swab  Result Value Ref Range Status   SARS Coronavirus 2 by RT PCR NEGATIVE NEGATIVE Final    Comment: (NOTE) SARS-CoV-2 target nucleic acids are NOT DETECTED.  The SARS-CoV-2 RNA is generally detectable in upper and lower respiratory specimens during the acute phase of infection. The lowest concentration of SARS-CoV-2 viral copies this assay can detect is 250 copies / mL. A negative result does not preclude SARS-CoV-2 infection and should not be used as the sole basis for treatment or other patient management decisions.  A negative result may occur with improper specimen collection / handling, submission of specimen other than nasopharyngeal swab, presence of viral mutation(s) within the areas targeted by this assay, and inadequate number of viral copies (<250 copies / mL). A negative result must be combined with clinical observations, patient history, and epidemiological information.  Fact Sheet for Patients:   https://www.patel.info/  Fact Sheet for Healthcare Providers: https://hall.com/  This test is not yet approved or  cleared by the [place] FDA and has been authorized for detection and/or diagnosis of SARS-CoV-2 by FDA under an Emergency Use Authorization (EUA).  This EUA will remain in effect (meaning this test can be used) for the duration of the COVID-19 declaration under Section 564(b)(1) of the Act, 21 U.S.C. section 360bbb-3(b)(1), unless the authorization is terminated or revoked sooner.  Performed at [facility], [place] [address] [place], [place] [address]     Labs: CBC: Recent Labs  Lab 04/14/22 0323  WBC 7.6  HGB 12.1  HCT 34.4*  MCV 95.0  PLT 450    Basic Metabolic Panel: Recent Labs  Lab 04/14/22 0323  NA 137  K 4.8  CL 106  CO2 24  GLUCOSE 99  BUN 20  CREATININE 0.93  CALCIUM 8.9  MG 1.8   Liver Function Tests: No results for input(s): "AST", "ALT", "ALKPHOS", "BILITOT", "PROT", "ALBUMIN" in the last 168 hours. CBG: No results for input(s): "GLUCAP" in the last 168 hours.  Discharge time spent: greater than 30 minutes.  Signed: Flora Lipps, MD [facility] 04/20/2022

## 2022-04-14 NOTE — Progress Notes (Signed)
Progress Note  Patient Name: Charlotte Castro Date of Encounter: 04/14/2022  Primary Cardiologist: Larae Grooms, MD   Subjective   Patient seen and examined at her bedside.  She offers no complaints at this time.  She was awake in bed when I arrived on the telephone.  Inpatient Medications    Scheduled Meds:  gabapentin  100 mg Oral BID   levothyroxine  88 mcg Oral QAC breakfast   lidocaine  3 patch Transdermal Q24H   metoprolol tartrate  25 mg Oral BID   pantoprazole  40 mg Oral Daily   sodium chloride flush  3 mL Intravenous Q12H   traMADol  50 mg Oral Q6H   Warfarin - Pharmacist Dosing Inpatient   Does not apply q1600   Continuous Infusions:  cefTRIAXone (ROCEPHIN)  IV Stopped (04/13/22 1154)   PRN Meds: acetaminophen **OR** acetaminophen, albuterol, hydrALAZINE, ondansetron **OR** ondansetron (ZOFRAN) IV   Vital Signs    Vitals:   04/13/22 1817 04/13/22 1948 04/14/22 0019 04/14/22 0630  BP:  136/60 (!) 154/63 (!) 161/63  Pulse: 64 78 70 67  Resp: '15 14 18 14  '$ Temp:  98.3 F (36.8 C) 98.4 F (36.9 C) 98.4 F (36.9 C)  TempSrc:  Oral Oral Oral  SpO2: 98% 100%  98%  Weight:    90.1 kg  Height:        Intake/Output Summary (Last 24 hours) at 04/14/2022 0828 Last data filed at 04/13/2022 2150 Gross per 24 hour  Intake 340 ml  Output 1450 ml  Net -1110 ml   Filed Weights   04/11/22 0616 04/13/22 0451 04/14/22 0630  Weight: 88 kg 91.3 kg 90.1 kg    Telemetry    Sinus rhythm- Personally Reviewed  ECG    None today- Personally Reviewed  Physical Exam    General: Comfortable, sitting up in a chair Head: Atraumatic, normal size  Eyes: PEERLA, EOMI  Neck: Supple, normal JVD Cardiac: Normal S1, S2; RRR; no murmurs, rubs, or gallops Lungs: Clear to auscultation bilaterally Abd: Soft, nontender, no hepatomegaly  Ext: warm, no edema Musculoskeletal: No deformities, BUE and BLE strength normal and equal Skin: Warm and dry, no rashes   Neuro: Alert  and oriented to person, place, time, and situation, CNII-XII grossly intact, no focal deficits  Psych: Normal mood and affect   Labs    Chemistry Recent Labs  Lab 04/12/22 0108 04/13/22 0251 04/14/22 0323  NA 137 135 137  K 4.7 4.9 4.8  CL 106 105 106  CO2 '24 23 24  '$ GLUCOSE 109* 95 99  BUN 38* 30* 20  CREATININE 2.14* 1.33* 0.93  CALCIUM 9.0 8.7* 8.9  GFRNONAA 22* 39* 59*  ANIONGAP '7 7 7     '$ Hematology Recent Labs  Lab 04/12/22 0108 04/13/22 0251 04/14/22 0323  WBC 10.3 8.3 7.6  RBC 3.58* 3.44* 3.62*  HGB 11.9* 11.5* 12.1  HCT 33.8* 32.8* 34.4*  MCV 94.4 95.3 95.0  MCH 33.2 33.4 33.4  MCHC 35.2 35.1 35.2  RDW 13.3 13.3 13.1  PLT 183 199 214    Cardiac EnzymesNo results for input(s): "TROPONINI" in the last 168 hours. No results for input(s): "TROPIPOC" in the last 168 hours.   BNPNo results for input(s): "BNP", "PROBNP" in the last 168 hours.   DDimer No results for input(s): "DDIMER" in the last 168 hours.   Radiology    ECHOCARDIOGRAM COMPLETE  Result Date: 04/12/2022    ECHOCARDIOGRAM REPORT   Patient Name:  [name] Date of Exam: 04/12/2022 Medical Rec #:  [id number]      Height:       69.0 in Accession #:    [id number]     Weight:       194.0 lb Date of Birth:  May 10, 1933      BSA:          2.039 m Patient Age:    44 years       BP:           144/65 mmHg Patient Gender: F              HR:           73 bpm. Exam Location:  Inpatient Procedure: 2D Echo, Cardiac Doppler, Color Doppler and Intracardiac            Opacification Agent Indications:    R07.9* Chest pain, unspecified  History:        Patient has prior history of Echocardiogram examinations, most                 recent 07/15/2021. CHF, Arrythmias:Atrial Fibrillation, Atrial                 Flutter and Tachycardia; Risk Factors:Hypertension and                 Dyslipidemia.  Sonographer:    Bernadene Person RDCS Referring Phys: [id number] RONDELL A SMITH  Sonographer Comments: Technically difficult study  due to poor echo windows. Image acquisition challenging due to respiratory motion. IMPRESSIONS  1. Left ventricular ejection fraction, by estimation, is 60 to 65%. The left ventricle has normal function. The left ventricle has no regional wall motion abnormalities. There is mild left ventricular hypertrophy. Left ventricular diastolic parameters are indeterminate.  2. Right ventricular systolic function is normal. The right ventricular size is normal. There is normal pulmonary artery systolic pressure. The estimated right ventricular systolic pressure is 10.2 mmHg.  3. Left atrial size was moderately dilated.  4. The mitral valve is degenerative. Mild to moderate mitral valve regurgitation, seen well in only one view due to image quality. No evidence of mitral stenosis. Moderate mitral annular calcification.  5. The aortic valve is abnormal. There is moderate calcification of the aortic valve. Aortic valve regurgitation is trivial. No aortic stenosis is present.  6. The inferior vena cava is normal in size with greater than 50% respiratory variability, suggesting right atrial pressure of 3 mmHg. FINDINGS  Left Ventricle: Left ventricular ejection fraction, by estimation, is 60 to 65%. The left ventricle has normal function. The left ventricle has no regional wall motion abnormalities. Definity contrast agent was given IV to delineate the left ventricular  endocardial borders. The left ventricular internal cavity size was normal in size. There is mild left ventricular hypertrophy. Left ventricular diastolic parameters are indeterminate. Right Ventricle: The right ventricular size is normal. No increase in right ventricular wall thickness. Right ventricular systolic function is normal. There is normal pulmonary artery systolic pressure. The tricuspid regurgitant velocity is 2.73 m/s, and  with an assumed right atrial pressure of 3 mmHg, the estimated right ventricular systolic pressure is 72.5 mmHg. Left Atrium: Left  atrial size was moderately dilated. Right Atrium: Right atrial size was normal in size. Pericardium: There is no evidence of pericardial effusion. Mitral Valve: The mitral valve is degenerative in appearance. Moderate mitral annular calcification. Mild to moderate mitral valve regurgitation. No evidence of mitral valve stenosis. Tricuspid Valve: The tricuspid  valve is normal in structure. Tricuspid valve regurgitation is trivial. No evidence of tricuspid stenosis. Aortic Valve: The aortic valve is abnormal. There is moderate calcification of the aortic valve. Aortic valve regurgitation is trivial. No aortic stenosis is present. Pulmonic Valve: The pulmonic valve was not well visualized. Pulmonic valve regurgitation is not visualized. No evidence of pulmonic stenosis. Aorta: The ascending aorta was not well visualized and the aortic root is normal in size and structure. Venous: The inferior vena cava is normal in size with greater than 50% respiratory variability, suggesting right atrial pressure of 3 mmHg. IAS/Shunts: No atrial level shunt detected by color flow Doppler.  LEFT VENTRICLE PLAX 2D LVIDd:         4.50 cm      Diastology LVIDs:         2.80 cm      LV e' medial:    10.30 cm/s LV PW:         1.00 cm      LV E/e' medial:  9.6 LV IVS:        1.00 cm      LV e' lateral:   11.80 cm/s LVOT diam:     1.80 cm      LV E/e' lateral: 8.4 LV SV:         46 LV SV Index:   22 LVOT Area:     2.54 cm  LV Volumes (MOD) LV vol d, MOD A2C: 110.0 ml LV vol d, MOD A4C: 79.8 ml LV vol s, MOD A2C: 35.0 ml LV vol s, MOD A4C: 31.2 ml LV SV MOD A2C:     75.0 ml LV SV MOD A4C:     79.8 ml LV SV MOD BP:      67.0 ml RIGHT VENTRICLE RV S prime:     11.30 cm/s TAPSE (M-mode): 1.9 cm LEFT ATRIUM             Index        RIGHT ATRIUM           Index LA diam:        5.40 cm 2.65 cm/m   RA Area:     15.60 cm LA Vol (A2C):   87.6 ml 42.95 ml/m  RA Volume:   35.10 ml  17.21 ml/m LA Vol (A4C):   91.0 ml 44.62 ml/m LA Biplane Vol: 92.9  ml 45.55 ml/m  AORTIC VALVE LVOT Vmax:   94.30 cm/s LVOT Vmean:  63.400 cm/s LVOT VTI:    0.179 m  AORTA Ao Root diam: 3.30 cm Ao Asc diam:  3.20 cm MITRAL VALVE               TRICUSPID VALVE MV Area (PHT): 4.39 cm    TR Peak grad:   29.8 mmHg MV Decel Time: 173 msec    TR Vmax:        273.00 cm/s MV E velocity: 99.00 cm/s                            SHUNTS                            Systemic VTI:  0.18 m                            Systemic Diam: 1.80 cm Cherlynn Kaiser MD  Electronically signed by Cherlynn Kaiser MD Signature Date/Time: 04/12/2022/11:09:44 AM    Final     Cardiac Studies   Nuclear stress test 2018 IMPRESSION: 1. No reversible ischemia or infarction.   2. Normal left ventricular wall motion.   3. Left ventricular ejection fraction 68%   4. Non invasive risk stratification*: Low   Echocardiogram: 07/2021  IMPRESSIONS   1. Left ventricular ejection fraction, by estimation, is 60 to 65%. The  left ventricle has normal function. The left ventricle has no regional  wall motion abnormalities. There is mild concentric left ventricular  hypertrophy. Left ventricular diastolic  parameters are indeterminate. The average left ventricular global  longitudinal strain is -15.9 %. The global longitudinal strain is normal.   2. Right ventricular systolic function is normal. The right ventricular  size is normal. There is normal pulmonary artery systolic pressure. The  estimated right ventricular systolic pressure is 97.3 mmHg.   3. Left atrial size was mildly dilated.   4. The mitral valve is normal in structure. Mild mitral valve  regurgitation. No evidence of mitral stenosis.   5. The aortic valve is calcified. Aortic valve regurgitation is mild.  Aortic valve sclerosis/calcification is present, without any evidence of  aortic stenosis.   6. The inferior vena cava is normal in size with greater than 50%  respiratory variability, suggesting right atrial pressure of 3  mmHg.   Patient Profile     88 y.o. female with a hx of chest pain (normal cors by cath in 2012 and low-risk NST in 05/2017), paroxysmal atrial fibrillation/atrial tachycardia, HTN, HLD, hypothyroidism and history of endometrial cancer.  Assessment & Plan    Atypical chest pain Paroxysmal atrial flutter/fibrillation Hypertension Stage III chronic kidney disease  Blood pressure is elevated will increase  Lopressor  to 50 mg twice daily.   Conitnue coumadin - INR managed by pharmacy.  Chest pain atypical, with some relief from Protonix. No indication for ischemic evaluation at this time.  Monitor kidney function as patient does have chronic kidney disease avoid nephrotoxin.    For questions or updates, please contact [place] Please consult [email or web address] for contact info under Cardiology/STEMI.      Signed, Berniece Salines, DO  04/14/2022, 8:28 AM

## 2022-04-14 NOTE — Progress Notes (Signed)
Pt left her cane in her room.  Informed her son, Marcello Moores.  He will come pick it up on Friday 11/3.  Idolina Primer, RN

## 2022-04-14 NOTE — TOC Progression Note (Addendum)
Transition of Care [facility]) - Progression Note    Patient Details  Name: Charlotte Castro MRN: [id number] Date of Birth: [birth year]  Transition of Care [facility]) CM/SW [place], [place] Phone Number: 04/14/2022, 11:48 AM  Clinical Narrative:     Update- CSW received notification that patient is approved for [facility] 3 day waiver for SNF. Informed Nicki with [facility] who confirmed she can accept patient today if medically ready. CSW informed MD.  CSW awaiting approval from [facility] to confirm patient can use 3 day SNF waiver to [facility].  Expected Discharge Plan: [place] Barriers to Discharge: No Barriers Identified  Expected Discharge Plan and Services Expected Discharge Plan: [place] In-house Referral: Clinical Social Work     Living arrangements for the past 2 months: Single Family Home Expected Discharge Date: 04/14/22                                     Social Determinants of Health (SDOH) Interventions    Readmission Risk Interventions     No data to display

## 2022-04-14 NOTE — Plan of Care (Signed)

## 2022-04-14 NOTE — Progress Notes (Signed)
[place] for warfarin Indication: atrial fibrillation  Allergies  Allergen Reactions   Iodinated Contrast Media Shortness Of Breath and Other (See Comments)    "Allergic," per MAR- Causes headaches, also   Zosyn [Piperacillin Sod-Tazobactam So] Other (See Comments)    "Allergic," per [facility]   Penicillins Other (See Comments)    Tolerated Zosyn Oct 2018, but "Allergic," per [facility] Did it involve swelling of the face/tongue/throat, SOB, or low BP? Unk Did it involve sudden or severe rash/hives, skin peeling, or any reaction on the inside of your mouth or nose? Unk Did you need to seek medical attention at a hospital or doctor's office? Unk When did it last happen? Unk If all above answers are "NO", may proceed with cephalosporin use.     Patient Measurements: Height: '5\' 9"'$  (175.3 cm) Weight: 90.1 kg (198 lb 10.2 oz) IBW/kg (Calculated) : 66.2   Vital Signs: Temp: 98.4 F (36.9 C) (11/01 0630) Temp Source: Oral (11/01 0630) BP: 161/63 (11/01 0630) Pulse Rate: 67 (11/01 0630)  Labs: Recent Labs    04/12/22 0108 04/13/22 0251 04/14/22 0323  HGB 11.9* 11.5* 12.1  HCT 33.8* 32.8* 34.4*  PLT 183 199 214  LABPROT 38.7* 33.2* 32.7*  INR 4.0* 3.3* 3.2*  CREATININE 2.14* 1.33* 0.93     Estimated Creatinine Clearance: 51 mL/min (by C-G formula based on SCr of 0.93 mg/dL).   Medical History: Past Medical History:  Diagnosis Date   Anemia    history of   Anxiety    Arthritis    "left knee" (04/05/2017)   Atrial fibrillation (HCC)    Atrial flutter ([place])    typical appearing   Atrial tachycardia    ablated 11/17/10  by JA  from [facility] of the aorta   CHF (congestive heart failure) (HCC)    Chronic lower back pain    Chronic nausea    Endometrial cancer (HCC)    grade 1   Gallstone pancreatitis    Gastritis    GERD (gastroesophageal reflux disease)    History of hiatal hernia    HTN (hypertension)    Hyperlipemia     Hypothyroidism    Obesity    Osteoporosis    Toe ulcer ([place])    left 3rd toe   Vitamin D deficiency      Assessment: 28 YOF presenting with CP, in afib RVR, hx of afib on warfarin PTA (last dose 10/27).  INR elevated yesterday but trending down, warfarin 2 mg given. Today INR continues to trend down but remains above goal at 3.2.  PTA dosing: 5 mg MWF, 2.5 mg all other days  Goal of Therapy:  INR 2-3 Monitor platelets by anticoagulation protocol: Yes   Plan:  -Warfarin 2 mg PO x1 tonight - reduced dose from home regimen -Daily INR  Sinda Du, PharmD Candidate 04/14/2022

## 2022-04-21 ENCOUNTER — Other Ambulatory Visit: Payer: Self-pay | Admitting: *Deleted

## 2022-04-21 NOTE — Patient Outreach (Signed)
Mrs. Gendron admitted to [facility] under [facility] 3-Day SNF waiver. Screening for potential [facility] care coordination services as benefit of insurance plan and PCP. Mrs. Vanblarcom recently admitted to [facility].   Attended collaborative team meeting at [facility] today. Mrs. Vultaggio is progressing well with therapy. From home alone.   Will continued to follow.  [name], MSN, RN,BSN [place] Acute Care Coordinator [phone] (Direct dial)

## 2022-04-28 ENCOUNTER — Other Ambulatory Visit: Payer: Self-pay | Admitting: *Deleted

## 2022-04-28 NOTE — Patient Outreach (Signed)
[place] Coordinator follow up. Charlotte Castro resides in [facility] Screening for potential [facility] care coordination services as benefit of insurance plan and PCP.   Facility site visit to [facility] today. Spoke with Charlotte Castro at bedside. Discussed [facility] care coordination services. Charlotte Castro reports she lives alone. Has supportive son that assists with transportation and the like. Reports she has all the DME she needs at home. Has no concerns with meals. Reports she gets a lot of telephone calls at home and the calls are "like a full time job in itself." Denies having any [facility] care coordination needs at this time. Left [facility] Care [facility] brochure and writer's contact information at bedside to call if needed.   Spoke with [name], SNF social worker. [name] reports Charlotte Castro likely to transition home over the weekend. Will have home health arranged. Agency not known yet.  [name], MSN, RN,BSN [place] Acute Care Coordinator [phone] (Direct dial)

## 2022-05-03 ENCOUNTER — Other Ambulatory Visit: Payer: Self-pay | Admitting: *Deleted

## 2022-05-03 NOTE — Patient Outreach (Signed)
[place] Coordinator post follow up. Verified in [facility] Mrs. Cecena discharged from [facility] on 05/01/22.  Mrs. Parsley previously declined [facility] follow up with Probation officer on 04/28/22. Will have home health.  No identifiable [facility] care coordination services at this time.    [name], MSN, RN,BSN [place] Acute Care Coordinator [phone] (Direct dial)

## 2022-05-10 ENCOUNTER — Ambulatory Visit: Payer: Medicare Other | Admitting: Internal Medicine

## 2022-05-11 ENCOUNTER — Other Ambulatory Visit: Payer: Self-pay | Admitting: Interventional Cardiology

## 2022-05-11 ENCOUNTER — Ambulatory Visit (INDEPENDENT_AMBULATORY_CARE_PROVIDER_SITE_OTHER): Payer: Medicare Other | Admitting: Internal Medicine

## 2022-05-11 ENCOUNTER — Encounter: Payer: Self-pay | Admitting: Internal Medicine

## 2022-05-11 VITALS — BP 130/80 | HR 59 | Temp 97.8°F | Wt 195.6 lb

## 2022-05-11 DIAGNOSIS — I5032 Chronic diastolic (congestive) heart failure: Secondary | ICD-10-CM

## 2022-05-11 DIAGNOSIS — I4821 Permanent atrial fibrillation: Secondary | ICD-10-CM | POA: Diagnosis not present

## 2022-05-11 DIAGNOSIS — Z09 Encounter for follow-up examination after completed treatment for conditions other than malignant neoplasm: Secondary | ICD-10-CM

## 2022-05-11 DIAGNOSIS — N179 Acute kidney failure, unspecified: Secondary | ICD-10-CM

## 2022-05-11 DIAGNOSIS — Z7901 Long term (current) use of anticoagulants: Secondary | ICD-10-CM | POA: Diagnosis not present

## 2022-05-11 DIAGNOSIS — I10 Essential (primary) hypertension: Secondary | ICD-10-CM

## 2022-05-11 DIAGNOSIS — E039 Hypothyroidism, unspecified: Secondary | ICD-10-CM

## 2022-05-11 DIAGNOSIS — R351 Nocturia: Secondary | ICD-10-CM | POA: Diagnosis not present

## 2022-05-11 DIAGNOSIS — R251 Tremor, unspecified: Secondary | ICD-10-CM

## 2022-05-11 DIAGNOSIS — R410 Disorientation, unspecified: Secondary | ICD-10-CM

## 2022-05-11 DIAGNOSIS — R269 Unspecified abnormalities of gait and mobility: Secondary | ICD-10-CM

## 2022-05-11 DIAGNOSIS — N3001 Acute cystitis with hematuria: Secondary | ICD-10-CM

## 2022-05-11 LAB — POCT URINALYSIS DIPSTICK
Bilirubin, UA: POSITIVE
Blood, UA: POSITIVE
Glucose, UA: NEGATIVE
Ketones, UA: POSITIVE
Nitrite, UA: POSITIVE
Protein, UA: POSITIVE — AB
Spec Grav, UA: 1.025 (ref 1.010–1.025)
Urobilinogen, UA: 0.2 E.U./dL
pH, UA: 5 (ref 5.0–8.0)

## 2022-05-11 LAB — URINALYSIS, ROUTINE W REFLEX MICROSCOPIC
Nitrite: POSITIVE — AB
Specific Gravity, Urine: >=1.03 — AB (ref 1.000–1.030)
Total Protein, Urine: NEGATIVE
Urine Glucose: NEGATIVE
Urobilinogen, UA: 0.2 (ref 0.0–1.0)
pH: 5 (ref 5.0–8.0)

## 2022-05-11 MED ORDER — SULFAMETHOXAZOLE-TRIMETHOPRIM 800-160 MG PO TABS: 1.0000 | ORAL_TABLET | Freq: Two times a day (BID) | ORAL | 0 refills | Status: DC

## 2022-05-11 NOTE — Progress Notes (Signed)
Established Patient Office Visit     CC/Reason for Visit: Hospital follow-up  HPI: Charlotte Castro is a 88 y.o. female who is coming in today for the above mentioned reasons. Past Medical History is significant for: hypertension, A. fib, chronic diastolic heart failure, hypothyroidism and recurrent UTIs.  She was hospitalized from October 28 through November seventh for A-fib with RVR with a UTI, she was then admitted to SNF for weakness and deconditioning until the 17th.  She tells me she refused home health upon discharge but will now reconsidered as her weakness persists and she feels is getting worse.  She does not have symptoms of a UTI but because of weakness we have gone ahead and checked 1.  She continues to complain of dizziness which is a long-term problem for her.   Past Medical/Surgical History: Past Medical History:  Diagnosis Date   Anemia    history of   Anxiety    Arthritis    "left knee" (04/05/2017)   Atrial fibrillation (HCC)    Atrial flutter ([place])    typical appearing   Atrial tachycardia    ablated 11/17/10  by JA  from the [facility] of the aorta   CHF (congestive heart failure) (HCC)    Chronic lower back pain    Chronic nausea    Endometrial cancer (HCC)    grade 1   Gallstone pancreatitis    Gastritis    GERD (gastroesophageal reflux disease)    History of hiatal hernia    HTN (hypertension)    Hyperlipemia    Hypothyroidism    Obesity    Osteoporosis    Toe ulcer ([place])    left 3rd toe   Vitamin D deficiency     Past Surgical History:  Procedure Laterality Date   ATRIAL ABLATION SURGERY  11/17/10   Atrial tachycardia arising from [facility] of the aorta ablated by [place]  01/11/2011   Archie Endo 01/12/2011   CHOLECYSTECTOMY N/A 04/08/2017   Procedure: LAPAROSCOPIC CHOLECYSTECTOMY;  Surgeon: Georganna Skeans, MD;  Location: [place];  Service: General;  Laterality: N/A;   FRACTURE SURGERY     HYSTEROSCOPY WITH D & C N/A  08/05/2017   Procedure: DILATATION AND CURETTAGE /HYSTEROSCOPY AND POLYPECTOMY;  Surgeon: Osborne Oman, MD;  Location: [facility];  Service: Gynecology;  Laterality: N/A;   LUMBAR SPINE SURGERY  ~ [place] SALPINGO OOPHERECTOMY Bilateral 09/20/2017   Procedure: XI ROBOTIC ASSISTED TOTAL HYSTERECTOMY WITH BILATERAL SALPINGO OOPHORECTOMY, SENTINAL LYMPH NODE BIOPSY;  Surgeon: Everitt Amber, MD;  Location: [facility];  Service: Gynecology;  Laterality: Bilateral;   SHOULDER SURGERY Right 1984 X2   /ntoes 01/19/2011   WRIST FRACTURE SURGERY Left 1983    Social History:  reports that she has never smoked. She has never used smokeless tobacco. She reports that she does not drink alcohol and does not use drugs.  Allergies: Allergies  Allergen Reactions   Iodinated Contrast Media Shortness Of Breath and Other (See Comments)    "Allergic," per [facility]- Causes headaches, also   Zosyn [Piperacillin Sod-Tazobactam So] Other (See Comments)    "Allergic," per [facility]   Penicillins Other (See Comments)    Tolerated Zosyn Oct 2018, but "Allergic," per [facility] Did it involve swelling of the face/tongue/throat, SOB, or low BP? Unk Did it involve sudden or severe rash/hives, skin peeling, or any reaction on the inside of your mouth  or nose? Unk Did you need to seek medical attention at a hospital or doctor's office? Unk When did it last happen? Unk If all above answers are "NO", may proceed with cephalosporin use.     Family History:  Family History  Problem Relation Age of Onset   Heart attack Father    Hypertension Father      Current Outpatient Medications:    acetaminophen (TYLENOL) 650 MG CR tablet, Take 650 mg by mouth 2 (two) times daily., Disp: , Rfl:    cholecalciferol (VITAMIN D3) 25 MCG (1000 UNIT) tablet, Take 1,000 Units by mouth daily., Disp: , Rfl:    gabapentin (NEURONTIN) 100 MG capsule, TAKE ONE CAPSULE BY MOUTH TWICE DAILY, Disp: 60 capsule, Rfl:  2   levothyroxine (SYNTHROID) 88 MCG tablet, TAKE ONE TABLET BY MOUTH EVERY DAY BEFORE BREAKFAST (Patient taking differently: Take 88 mcg by mouth daily before breakfast.), Disp: 30 tablet, Rfl: 0   lidocaine (LIDODERM) 5 %, Place 1 patch onto the skin daily. Remove & Discard patch within 12 hours or as directed by MD, Disp: 30 patch, Rfl: 0   metoprolol tartrate (LOPRESSOR) 50 MG tablet, TAKE ONE TABLET TWICE DAILY WITH A MEAL (Patient taking differently: Take 50 mg by mouth 2 (two) times daily.), Disp: 180 tablet, Rfl: 2   ondansetron (ZOFRAN-ODT) 8 MG disintegrating tablet, '8mg'$  ODT q8 hours prn nausea, Disp: 10 tablet, Rfl: 0   pantoprazole (PROTONIX) 20 MG tablet, Take 1 tablet (20 mg total) by mouth 2 (two) times daily before a meal., Disp: , Rfl: 0   sulfamethoxazole-trimethoprim (BACTRIM DS) 800-160 MG tablet, Take 1 tablet by mouth 2 (two) times daily for 7 days., Disp: 14 tablet, Rfl: 0   traMADol (ULTRAM) 50 MG tablet, Take 1 tablet (50 mg total) by mouth every 6 (six) hours., Disp: 10 tablet, Rfl: 0   vitamin B-12 (CYANOCOBALAMIN) 1000 MCG tablet, Take 5,000 mcg by mouth 2 (two) times daily., Disp: , Rfl:    warfarin (COUMADIN) 5 MG tablet, TAKE ONE-HALF TO ONE TABLET BY MOUTH DAILY AS DIRECTED BY COUMADIN CLINIC (Patient taking differently: Take 2.5-5 mg by mouth See admin instructions. Take 5 mg (1 tablet) by mouth on Monday, Wednesday, and Friday's. Take 2.5 mg (1/2 tablet) on Sunday, Tuesday, Thursday, and Saturday's.), Disp: 30 tablet, Rfl: 3  Review of Systems:  Constitutional: Denies fever, chills, diaphoresis. HEENT: Denies photophobia, eye pain, redness, hearing loss, ear pain, congestion, sore throat, rhinorrhea, sneezing, mouth sores, trouble swallowing, neck pain, neck stiffness and tinnitus.   Respiratory: Denies SOB, DOE, cough, chest tightness,  and wheezing.   Cardiovascular: Denies chest pain, palpitations and leg swelling.  Gastrointestinal: Denies nausea, vomiting,  abdominal pain, diarrhea, constipation, blood in stool and abdominal distention.  Genitourinary: Denies dysuria, urgency, frequency, hematuria, flank pain and difficulty urinating.  Endocrine: Denies: hot or cold intolerance, sweats, changes in hair or nails, polyuria, polydipsia. Musculoskeletal: Denies myalgias, back pain, joint swelling, arthralgias and gait problem.  Skin: Denies pallor, rash and wound.  Neurological: Denies dizziness, seizures, syncope, weakness, light-headedness, numbness and headaches.  Hematological: Denies adenopathy. Easy bruising, personal or family bleeding history  Psychiatric/Behavioral: Denies suicidal ideation, mood changes, confusion, nervousness, sleep disturbance and agitation    Physical Exam: Vitals:   05/11/22 1127  BP: 130/80  Pulse: (!) 59  Temp: 97.8 F (36.6 C)  TempSrc: Oral  SpO2: 99%  Weight: 195 lb 9.6 oz (88.7 kg)    Body mass index is 28.89 kg/m.   Constitutional:  NAD, calm, comfortable Eyes: PERRL, lids and conjunctivae normal ENMT: Mucous membranes are moist.  Respiratory: clear to auscultation bilaterally, no wheezing, no crackles. Normal respiratory effort. No accessory muscle use.  Cardiovascular: Regular rate and rhythm, no murmurs / rubs / gallops. No extremity edema.   Impression and Plan:  Nocturia - Plan: POCT urinalysis dipstick, Urinalysis, Culture, Urine, Urinalysis  Hospital discharge follow-up  Acute cystitis with hematuria - Plan: sulfamethoxazole-trimethoprim (BACTRIM DS) 800-160 MG tablet  Permanent atrial fibrillation ([place])  Long term current use of anticoagulant therapy  Essential hypertension, benign - Plan: CBC with Differential/Platelet, Basic metabolic panel  AKI (acute kidney injury) ([place]) - Plan: Magnesium  Hypothyroidism, unspecified type  [facility] charts have been reviewed in detail.  I also have access to discharge medications from SNF that I have also reviewed. -I will order CBC, BMP  and magnesium that were requested by discharging physician. -She definitely has a UTI today with the urine dipstick that is positive for leukocytes, nitrates, blood.  She was treated with Rocephin in the hospital, she has a penicillin allergy.  I will give Bactrim DS for 7 days. -Since her main complaint is continued weakness, I will go ahead and arrange for home health services that she declined at time of SNF discharge.  Time spent:34 minutes reviewing chart, interviewing and examining patient and formulating plan of care.      Lelon Frohlich, MD [facility] at [facility]

## 2022-05-12 ENCOUNTER — Telehealth: Payer: Self-pay | Admitting: *Deleted

## 2022-05-12 NOTE — Progress Notes (Unsigned)
  Care Coordination  Outreach Note  05/12/2022 Name: Charlotte Castro MRN: [id number] DOB: Jun 10, 1933   Care Coordination Outreach Attempts: An unsuccessful telephone outreach was attempted today to offer the patient information about available care coordination services as a benefit of their health plan.   Referral received   Follow Up Plan:  Additional outreach attempts will be made to offer the patient care coordination information and services.   Encounter Outcome:  No Answer  Julian Hy, [place] Direct Dial: [phone]

## 2022-05-13 NOTE — Progress Notes (Signed)
  Care Coordination   Note   05/13/2022 Name: KYNDRA CONDRON MRN: [id number] DOB: 1932-12-06  SKYLINN VIALPANDO is a 88 y.o. year old female who sees Isaac Bliss, Rayford Halsted, MD for primary care. I reached out to [name] by phone today to offer care coordination services.  Ms. Mcmurry was given information about Care Coordination services today including:   The Care Coordination services include support from the care team which includes your Nurse Coordinator, Clinical Social Worker, or Pharmacist.  The Care Coordination team is here to help remove barriers to the health concerns and goals most important to you. Care Coordination services are voluntary, and the patient may decline or stop services at any time by request to their care team member.   Care Coordination Consent Status: Patient did not agree to participate in care coordination services at this time.  Follow up plan:  pt appreciative but declined services at this time - contact info given if needed in the future   Encounter Outcome:  Pt. Refused  Julian Hy, [place] Direct Dial: [phone]

## 2022-05-14 LAB — URINE CULTURE
MICRO NUMBER:: 14239698
SPECIMEN QUALITY:: ADEQUATE

## 2022-05-18 ENCOUNTER — Other Ambulatory Visit: Payer: Self-pay | Admitting: *Deleted

## 2022-05-18 MED ORDER — NITROFURANTOIN MONOHYD MACRO 100 MG PO CAPS: 100.0000 mg | ORAL_CAPSULE | Freq: Two times a day (BID) | ORAL | 0 refills | Status: DC

## 2022-07-06 ENCOUNTER — Other Ambulatory Visit: Payer: Self-pay | Admitting: Internal Medicine

## 2022-07-06 DIAGNOSIS — E039 Hypothyroidism, unspecified: Secondary | ICD-10-CM

## 2022-11-16 ENCOUNTER — Other Ambulatory Visit: Payer: Self-pay | Admitting: Internal Medicine

## 2022-11-16 ENCOUNTER — Other Ambulatory Visit: Payer: Self-pay | Admitting: Interventional Cardiology

## 2022-11-16 DIAGNOSIS — I4821 Permanent atrial fibrillation: Secondary | ICD-10-CM

## 2022-11-16 DIAGNOSIS — E039 Hypothyroidism, unspecified: Secondary | ICD-10-CM

## 2022-11-16 NOTE — Telephone Encounter (Signed)
Called. No answer.  Left voice mail for pt to call the [facility] office to make an INR appt.  Has not had INR checked at our office since 10/23.  Will not approve refill until we hear back from pt.

## 2022-11-19 ENCOUNTER — Ambulatory Visit: Payer: Medicare Other | Attending: Internal Medicine

## 2022-11-19 DIAGNOSIS — Z5181 Encounter for therapeutic drug level monitoring: Secondary | ICD-10-CM

## 2022-11-19 DIAGNOSIS — I4821 Permanent atrial fibrillation: Secondary | ICD-10-CM | POA: Diagnosis present

## 2022-11-19 LAB — POCT INR: INR: 1.8 — AB (ref 2.0–3.0)

## 2022-11-19 NOTE — Patient Instructions (Signed)
TAKE 2 TABLETS TONIGHT ONLY Continue taking Warfarin 1/2 tablet daily except for 1 tablet on Mondays, Wednesdays and Fridays. Call Coumadin Clinic for any changes in medications or upcoming procedures. Recheck INR 2 weeks.  Coumadin Clinic [phone] or [phone]

## 2022-12-03 ENCOUNTER — Ambulatory Visit: Payer: Medicare Other | Attending: Interventional Cardiology

## 2022-12-03 DIAGNOSIS — I4821 Permanent atrial fibrillation: Secondary | ICD-10-CM | POA: Diagnosis not present

## 2022-12-03 DIAGNOSIS — Z5181 Encounter for therapeutic drug level monitoring: Secondary | ICD-10-CM | POA: Insufficient documentation

## 2022-12-03 LAB — POCT INR: INR: 3.1 — AB (ref 2.0–3.0)

## 2022-12-03 NOTE — Patient Instructions (Signed)
Continue taking Warfarin 1/2 tablet daily except for 1 tablet on Mondays, Wednesdays and Fridays. Call Coumadin Clinic for any changes in medications or upcoming procedures. Recheck INR 4 weeks.  C[facility] Clinic [phone] or [phone]

## 2022-12-20 ENCOUNTER — Other Ambulatory Visit: Payer: Self-pay | Admitting: Interventional Cardiology

## 2022-12-20 DIAGNOSIS — I4821 Permanent atrial fibrillation: Secondary | ICD-10-CM

## 2022-12-31 ENCOUNTER — Ambulatory Visit: Payer: Medicare Other | Attending: Interventional Cardiology | Admitting: *Deleted

## 2022-12-31 DIAGNOSIS — Z5181 Encounter for therapeutic drug level monitoring: Secondary | ICD-10-CM | POA: Diagnosis not present

## 2022-12-31 DIAGNOSIS — I4821 Permanent atrial fibrillation: Secondary | ICD-10-CM | POA: Diagnosis not present

## 2022-12-31 LAB — POCT INR: INR: 1.7 — AB (ref 2.0–3.0)

## 2022-12-31 NOTE — Patient Instructions (Signed)
Description   Today take 1.5 tablets of warfarin then continue taking Warfarin 1/2 tablet daily except for 1 tablet on Mondays, Wednesdays and Fridays. Call Coumadin Clinic for any changes in medications or upcoming procedures. Recheck INR 3 weeks.  Coumadin Clinic [phone] or [phone]

## 2023-01-21 ENCOUNTER — Ambulatory Visit: Payer: Medicare Other

## 2023-02-02 ENCOUNTER — Ambulatory Visit: Payer: Medicare Other | Attending: Interventional Cardiology

## 2023-02-02 DIAGNOSIS — Z5181 Encounter for therapeutic drug level monitoring: Secondary | ICD-10-CM | POA: Diagnosis not present

## 2023-02-02 DIAGNOSIS — I4821 Permanent atrial fibrillation: Secondary | ICD-10-CM | POA: Diagnosis not present

## 2023-02-02 LAB — POCT INR: INR: 2.3 (ref 2.0–3.0)

## 2023-02-02 NOTE — Patient Instructions (Signed)
Description   Continue taking Warfarin 1/2 tablet daily except for 1 tablet on Mondays, Wednesdays and Fridays.  Call Coumadin Clinic for any changes in medications or upcoming procedures.  Recheck INR 4 weeks.   Coumadin Clinic [phone] or [phone]

## 2023-02-07 ENCOUNTER — Encounter: Payer: Self-pay | Admitting: Interventional Cardiology

## 2023-02-07 ENCOUNTER — Ambulatory Visit: Payer: Medicare Other | Admitting: Interventional Cardiology

## 2023-02-07 VITALS — BP 130/82 | HR 99 | Ht 69.0 in | Wt 188.8 lb

## 2023-02-07 DIAGNOSIS — I4821 Permanent atrial fibrillation: Secondary | ICD-10-CM | POA: Diagnosis present

## 2023-02-07 DIAGNOSIS — R0609 Other forms of dyspnea: Secondary | ICD-10-CM | POA: Diagnosis present

## 2023-02-07 DIAGNOSIS — I10 Essential (primary) hypertension: Secondary | ICD-10-CM | POA: Insufficient documentation

## 2023-02-07 DIAGNOSIS — R5382 Chronic fatigue, unspecified: Secondary | ICD-10-CM | POA: Insufficient documentation

## 2023-02-07 NOTE — Patient Instructions (Signed)
Medication Instructions:  Your physician recommends that you continue on your current medications as directed. Please refer to the Current Medication list given to you today.  *If you need a refill on your cardiac medications before your next appointment, please call your pharmacy*  Lab Work: TODAY: CMET, CBC, and TSH  If you have labs (blood work) drawn today and your tests are completely normal, you will receive your results only by: MyChart Message (if you have MyChart) OR A paper copy in the mail If you have any lab test that is abnormal or we need to change your treatment, we will call you to review the results.  Follow-Up: At [facility], you and your health needs are our priority.  As part of our continuing mission to provide you with exceptional heart care, we have created designated Provider Care Teams.  These Care Teams include your primary Cardiologist (physician) and Advanced Practice Providers (APPs -  Physician Assistants and Nurse Practitioners) who all work together to provide you with the care you need, when you need it.  Your next appointment:   1 year  Provider:   Thomasene Ripple, MD

## 2023-02-07 NOTE — Progress Notes (Signed)
Cardiology Office Note   Date:  02/07/2023   ID:  Charlotte, Castro 1933-03-25, MRN [id number]  PCP:  Philip Aspen, Limmie Patricia, MD    No chief complaint on file.  AFib  Wt Readings from Last 3 Encounters:  02/07/23 188 lb 12.8 oz (85.6 kg)  05/11/22 195 lb 9.6 oz (88.7 kg)  04/14/22 198 lb 10.2 oz (90.1 kg)       History of Present Illness: Charlotte Castro is a 89 y.o. female   who has had several different types of tachyarrhythmia and bradycardia dating back to 2012.   Wallace Cullens coating noted on her eye, thought to be related to amiodarone many years ago.     She had a tremor and had been started on Parkinson's medicines many years ago. These were stopped. The patient disagreed with the diagnosis of Parkinsons. She was seen in the emergency room for syncope in 2014. Her main complaint is feeling weak and having joint pains. She does admit to significant anxiety which happens at random times. The daughter felt that anxiety is a big component of the patient's illness, when she was at the visit in 2/14.   In the past, she had a large hiatal hernia and was referred to surgery, but she declined an operation. She is trying to stick to a soft diet.     She had GYN surgery, records reveal: " On 09/20/2017, the patient underwent the following: Procedure(s): XI ROBOTIC ASSISTED TOTAL HYSTERECTOMY WITH BILATERAL SALPINGO OOPHORECTOMY, SENTINAL LYMPH NODE BIOPSY.   The postoperative course was uneventful.  She was discharged to home on postoperative day 1 tolerating a regular diet, ambulating, pain controlled. She is to resume her Coumadin 5 days after surgery. She was treated with radiation as well.     She had some gall stones removed.    In 2018, nuclear stress test showed: "No reversible ischemia or infarction.   2. Normal left ventricular wall motion.   3. Left ventricular ejection fraction 68%"   Noted in 2023: "She has developed worsening right shoulder pain due to end-stage  arthritis.  She discussed surgery with the orthopedics team but declined.  She is hoping to have relief with more conservative measures, injections. She continues to have shortness of breath."  Today, she has multiple complaints which are many of the same complaints that she has had for years.  She reports tremor, weakness, dyspnea on exertion.  She reports that overall frustration having to give up driving and give up her normal life where she was very active.  She has been frustrated by several trips to the nursing home.  She feels frustrated that she has to get up so many times at night to use the bathroom.  No specific new cardiac complaints.    Past Medical History:  Diagnosis Date   Anemia    history of   Anxiety    Arthritis    "left knee" (04/05/2017)   Atrial fibrillation (HCC)    Atrial flutter (HCC)    typical appearing   Atrial tachycardia    ablated 11/17/10  by JA  from the [facility] of the aorta   CHF (congestive heart failure) (HCC)    Chronic lower back pain    Chronic nausea    Endometrial cancer (HCC)    grade 1   Gallstone pancreatitis    Gastritis    GERD (gastroesophageal reflux disease)    History of hiatal hernia    HTN (hypertension)  Hyperlipemia    Hypothyroidism    Obesity    Osteoporosis    Toe ulcer (HCC)    left 3rd toe   Vitamin D deficiency     Past Surgical History:  Procedure Laterality Date   ATRIAL ABLATION SURGERY  11/17/10   Atrial tachycardia arising from [facility] of the aorta ablated by [facility] SURGERY     CARDIAC CATHETERIZATION  01/11/2011   Charlotte Castro 01/12/2011   CHOLECYSTECTOMY N/A 04/08/2017   Procedure: LAPAROSCOPIC CHOLECYSTECTOMY;  Surgeon: Violeta Gelinas, MD;  Location: [facility];  Service: General;  Laterality: N/A;   FRACTURE SURGERY     HYSTEROSCOPY WITH D & C N/A 08/05/2017   Procedure: DILATATION AND CURETTAGE /HYSTEROSCOPY AND POLYPECTOMY;  Surgeon: Tereso Newcomer, MD;  Location: [facility];  Service: Gynecology;  Laterality: N/A;    LUMBAR SPINE SURGERY  ~ 1998   ROBOTIC ASSISTED TOTAL HYSTERECTOMY WITH BILATERAL SALPINGO OOPHERECTOMY Bilateral 09/20/2017   Procedure: XI ROBOTIC ASSISTED TOTAL HYSTERECTOMY WITH BILATERAL SALPINGO OOPHORECTOMY, SENTINAL LYMPH NODE BIOPSY;  Surgeon: Adolphus Birchwood, MD;  Location: [facility];  Service: Gynecology;  Laterality: Bilateral;   SHOULDER SURGERY Right 1984 X2   /ntoes 01/19/2011   WRIST FRACTURE SURGERY Left 1983     Current Outpatient Medications  Medication Sig Dispense Refill   acetaminophen (TYLENOL) 650 MG CR tablet Take 650 mg by mouth 2 (two) times daily.     cholecalciferol (VITAMIN D3) 25 MCG (1000 UNIT) tablet Take 1,000 Units by mouth daily.     gabapentin (NEURONTIN) 100 MG capsule TAKE ONE CAPSULE BY MOUTH TWICE DAILY 60 capsule 2   levothyroxine (SYNTHROID) 88 MCG tablet TAKE ONE TABLET BY MOUTH EVERY DAY BEFORE BREAKFAST 90 tablet 0   lidocaine (LIDODERM) 5 % Place 1 patch onto the skin daily. Remove & Discard patch within 12 hours or as directed by MD 30 patch 0   metoprolol tartrate (LOPRESSOR) 50 MG tablet TAKE ONE TABLET TWICE DAILY WITH A MEAL 180 tablet 2   ondansetron (ZOFRAN-ODT) 8 MG disintegrating tablet 8mg  ODT q8 hours prn nausea 10 tablet 0   pantoprazole (PROTONIX) 20 MG tablet Take 1 tablet (20 mg total) by mouth 2 (two) times daily before a meal.  0   vitamin B-12 (CYANOCOBALAMIN) 1000 MCG tablet Take 5,000 mcg by mouth 2 (two) times daily.     warfarin (COUMADIN) 5 MG tablet TAKE ONE-HALF TO ONE TABLET DAILY AS DIRECTED BY COUMADIN CLINIC 30 tablet 3   No current facility-administered medications for this visit.    Allergies:   Iodinated contrast media, Zosyn [piperacillin sod-tazobactam so], and Penicillins    Social History:  The patient  reports that she has never smoked. She has never used smokeless tobacco. She reports that she does not drink alcohol and does not use drugs.   Family History:  The patient's family history includes Heart attack in  her father; Hypertension in her father.    ROS:  Please see the history of present illness.   Otherwise, review of systems are positive for tremor.   All other systems are reviewed and negative.    PHYSICAL EXAM: VS:  BP 130/82   Pulse 99   Ht 5\' 9"  (1.753 m)   Wt 188 lb 12.8 oz (85.6 kg)   SpO2 97%   BMI 27.88 kg/m  , BMI Body mass index is 27.88 kg/m. GEN: Well nourished, well developed, in no acute distress HEENT: normal Neck: no JVD, carotid bruits, or masses Cardiac:  RRR; no murmurs, rubs, or gallops,1+ bilateral LE edema  Respiratory:  clear to auscultation bilaterally, normal work of breathing GI: soft, nontender, nondistended, + BS MS: no deformity or atrophy; varicose veins, ankles discolored c/w venous insufficiency Skin: warm and dry, no rash Neuro:  tremor Psych: euthymic mood, full affect   EKG:   The ekg ordered today demonstrates Atrial flutter, rate controlled   Recent Labs: 04/01/2022: B Natriuretic Peptide 385.6 04/11/2022: TSH 0.621 04/14/2022: BUN 20; Creatinine, Ser 0.93; Hemoglobin 12.1; Magnesium 1.8; Platelets 214; Potassium 4.8; Sodium 137   Lipid Panel No results found for: "CHOL", "TRIG", "HDL", "CHOLHDL", "VLDL", "LDLCALC", "LDLDIRECT"   Other studies Reviewed: Additional studies/ records that were reviewed today with results demonstrating: hospital records reviewed.   ASSESSMENT AND PLAN:  AFib: Rate controlled flutter. Rare palpitations.  Continue metoprolol.  Heart rates have been in the 70s to 100 range on recent ECGs. SHOB/Chronic fatigue: stable. Tremor.  HTN: The current medical regimen is effective;  continue present plan and medications. Anticoagulated: Avoid falls. I encouraged her to use  her walker w to help avoid falls.  Warfarin for stroke prevention. Check CMet, CBC, TSH today as she has not had labs recently.   Current medicines are reviewed at length with the patient today.  The patient concerns regarding her medicines  were addressed.  The following changes have been made:  No change  Labs/ tests ordered today include:   Orders Placed This Encounter  Procedures   EKG 12-Lead    Recommend 150 minutes/week of aerobic exercise Low fat, low carb, high fiber diet recommended  Disposition:   FU in with Dr. Servando Salina   Signed, Lance Muss, MD  02/07/2023 11:00 AM    [facility] [address] [place], [place], [place]  [address] Phone: [phone]; Fax: [phone]

## 2023-02-08 LAB — COMPREHENSIVE METABOLIC PANEL
ALT: 11 IU/L (ref 0–32)
AST: 16 IU/L (ref 0–40)
Albumin: 4 g/dL (ref 3.7–4.7)
Alkaline Phosphatase: 89 IU/L (ref 44–121)
BUN/Creatinine Ratio: 24 (ref 12–28)
BUN: 20 mg/dL (ref 8–27)
Bilirubin Total: 1 mg/dL (ref 0.0–1.2)
CO2: 24 mmol/L (ref 20–29)
Calcium: 9.8 mg/dL (ref 8.7–10.3)
Chloride: 106 mmol/L (ref 96–106)
Creatinine, Ser: 0.82 mg/dL (ref 0.57–1.00)
Globulin, Total: 2.4 g/dL (ref 1.5–4.5)
Glucose: 92 mg/dL (ref 70–99)
Potassium: 4.4 mmol/L (ref 3.5–5.2)
Sodium: 143 mmol/L (ref 134–144)
Total Protein: 6.4 g/dL (ref 6.0–8.5)
eGFR: 69 mL/min/{1.73_m2} (ref >59)

## 2023-02-08 LAB — CBC
Hematocrit: 41.2 % (ref 34.0–46.6)
Hemoglobin: 13.9 g/dL (ref 11.1–15.9)
MCH: 32.6 pg (ref 26.6–33.0)
MCHC: 33.7 g/dL (ref 31.5–35.7)
MCV: 97 fL (ref 79–97)
Platelets: 172 10*3/uL (ref 150–450)
RBC: 4.27 x10E6/uL (ref 3.77–5.28)
RDW: 13.1 % (ref 11.7–15.4)
WBC: 7.7 10*3/uL (ref 3.4–10.8)

## 2023-02-08 LAB — TSH: TSH: 2.63 u[IU]/mL (ref 0.450–4.500)

## 2023-03-02 ENCOUNTER — Ambulatory Visit (INDEPENDENT_AMBULATORY_CARE_PROVIDER_SITE_OTHER): Payer: Medicare Other | Admitting: Internal Medicine

## 2023-03-02 ENCOUNTER — Encounter: Payer: Self-pay | Admitting: Internal Medicine

## 2023-03-02 ENCOUNTER — Other Ambulatory Visit: Payer: Self-pay | Admitting: Internal Medicine

## 2023-03-02 ENCOUNTER — Ambulatory Visit: Payer: Medicare Other | Attending: Cardiovascular Disease | Admitting: *Deleted

## 2023-03-02 VITALS — BP 128/80 | HR 100 | Temp 97.4°F | Wt 185.0 lb

## 2023-03-02 DIAGNOSIS — I4821 Permanent atrial fibrillation: Secondary | ICD-10-CM | POA: Diagnosis not present

## 2023-03-02 DIAGNOSIS — R42 Dizziness and giddiness: Secondary | ICD-10-CM

## 2023-03-02 DIAGNOSIS — R300 Dysuria: Secondary | ICD-10-CM | POA: Diagnosis not present

## 2023-03-02 DIAGNOSIS — R251 Tremor, unspecified: Secondary | ICD-10-CM | POA: Diagnosis not present

## 2023-03-02 DIAGNOSIS — Z23 Encounter for immunization: Secondary | ICD-10-CM | POA: Diagnosis not present

## 2023-03-02 DIAGNOSIS — Z87440 Personal history of urinary (tract) infections: Secondary | ICD-10-CM

## 2023-03-02 DIAGNOSIS — Z5181 Encounter for therapeutic drug level monitoring: Secondary | ICD-10-CM

## 2023-03-02 LAB — POCT INR: INR: 1.6 — AB (ref 2.0–3.0)

## 2023-03-02 NOTE — Patient Instructions (Signed)
Description   Today take 1.5 tablets of warfarin then continue taking Warfarin 1/2 tablet daily except for 1 tablet on Mondays, Wednesdays and Fridays. Call Coumadin Clinic for any changes in medications or upcoming procedures. Recheck INR 3 weeks.  Coumadin Clinic [phone] or [phone]

## 2023-03-02 NOTE — Progress Notes (Signed)
Established Patient Office Visit     CC/Reason for Visit: Discuss multiple complaints  HPI: Charlotte Castro is a 89 y.o. female who is coming in today for the above mentioned reasons. Past Medical History is significant for: Hypertension, A-fib, chronic diastolic heart failure, hypothyroidism and recurrent UTIs.  I have not seen her since November 2023.  She is here today with a family friend.  She has several complaints:  1.  She is having dysuria, she believes she has another UTI.  She is unable to provide sample in office today.  2.  Requesting flu vaccine.  3.  Has been having dizziness.  She describes it as a sensation of the room spinning around her.  She feels it affects her left side more.  4.  She has been having tremors for over a year.  Feels they have gotten worse.  She is unable to eat liquids with a spoon due to her hand tremors.  She is very reticent to accept suggestions and help from support system.   Past Medical/Surgical History: Past Medical History:  Diagnosis Date   Anemia    history of   Anxiety    Arthritis    "left knee" (04/05/2017)   Atrial fibrillation (HCC)    Atrial flutter (HCC)    typical appearing   Atrial tachycardia    ablated 11/17/10  by JA  from the [facility] of the aorta   CHF (congestive heart failure) (HCC)    Chronic lower back pain    Chronic nausea    Endometrial cancer (HCC)    grade 1   Gallstone pancreatitis    Gastritis    GERD (gastroesophageal reflux disease)    History of hiatal hernia    HTN (hypertension)    Hyperlipemia    Hypothyroidism    Obesity    Osteoporosis    Toe ulcer (HCC)    left 3rd toe   Vitamin D deficiency     Past Surgical History:  Procedure Laterality Date   ATRIAL ABLATION SURGERY  11/17/10   Atrial tachycardia arising from [facility] of the aorta ablated by [facility] SURGERY     CARDIAC CATHETERIZATION  01/11/2011   [name] 01/12/2011   CHOLECYSTECTOMY N/A 04/08/2017   Procedure: LAPAROSCOPIC  CHOLECYSTECTOMY;  Surgeon: Violeta Gelinas, MD;  Location: [facility] OR;  Service: General;  Laterality: N/A;   FRACTURE SURGERY     HYSTEROSCOPY WITH D & C N/A 08/05/2017   Procedure: DILATATION AND CURETTAGE /HYSTEROSCOPY AND POLYPECTOMY;  Surgeon: Tereso Newcomer, MD;  Location: [facility] ORS;  Service: Gynecology;  Laterality: N/A;   LUMBAR SPINE SURGERY  ~ 1998   ROBOTIC ASSISTED TOTAL HYSTERECTOMY WITH BILATERAL SALPINGO OOPHERECTOMY Bilateral 09/20/2017   Procedure: XI ROBOTIC ASSISTED TOTAL HYSTERECTOMY WITH BILATERAL SALPINGO OOPHORECTOMY, SENTINAL LYMPH NODE BIOPSY;  Surgeon: Adolphus Birchwood, MD;  Location: [facility] ORS;  Service: Gynecology;  Laterality: Bilateral;   SHOULDER SURGERY Right 1984 X2   /ntoes 01/19/2011   WRIST FRACTURE SURGERY Left 1983    Social History:  reports that she has never smoked. She has never used smokeless tobacco. She reports that she does not drink alcohol and does not use drugs.  Allergies: Allergies  Allergen Reactions   Iodinated Contrast Media Shortness Of Breath and Other (See Comments)    "Allergic," per MAR- Causes headaches, also   Zosyn [Piperacillin Sod-Tazobactam So] Other (See Comments)    "Allergic," per MAR   Penicillins Other (See Comments)  Tolerated Zosyn Oct 2018, but "Allergic," per [facility] Did it involve swelling of the face/tongue/throat, SOB, or low BP? Unk Did it involve sudden or severe rash/hives, skin peeling, or any reaction on the inside of your mouth or nose? Unk Did you need to seek medical attention at a hospital or doctor's office? Unk When did it last happen? Unk If all above answers are "NO", may proceed with cephalosporin use.     Family History:  Family History  Problem Relation Age of Onset   Heart attack Father    Hypertension Father      Current Outpatient Medications:    acetaminophen (TYLENOL) 650 MG CR tablet, Take 650 mg by mouth 2 (two) times daily., Disp: , Rfl:    cholecalciferol (VITAMIN D3) 25 MCG (1000 UNIT)  tablet, Take 1,000 Units by mouth daily., Disp: , Rfl:    gabapentin (NEURONTIN) 100 MG capsule, TAKE ONE CAPSULE BY MOUTH TWICE DAILY, Disp: 60 capsule, Rfl: 2   levothyroxine (SYNTHROID) 88 MCG tablet, TAKE ONE TABLET BY MOUTH EVERY DAY BEFORE BREAKFAST, Disp: 90 tablet, Rfl: 0   lidocaine (LIDODERM) 5 %, Place 1 patch onto the skin daily. Remove & Discard patch within 12 hours or as directed by MD, Disp: 30 patch, Rfl: 0   metoprolol tartrate (LOPRESSOR) 50 MG tablet, TAKE ONE TABLET TWICE DAILY WITH A MEAL, Disp: 180 tablet, Rfl: 2   ondansetron (ZOFRAN-ODT) 8 MG disintegrating tablet, 8mg  ODT q8 hours prn nausea, Disp: 10 tablet, Rfl: 0   pantoprazole (PROTONIX) 20 MG tablet, Take 1 tablet (20 mg total) by mouth 2 (two) times daily before a meal., Disp: , Rfl: 0   vitamin B-12 (CYANOCOBALAMIN) 1000 MCG tablet, Take 5,000 mcg by mouth 2 (two) times daily., Disp: , Rfl:    warfarin (COUMADIN) 5 MG tablet, TAKE ONE-HALF TO ONE TABLET DAILY AS DIRECTED BY COUMADIN CLINIC, Disp: 30 tablet, Rfl: 3  Review of Systems:  Negative unless indicated in HPI.   Physical Exam: Vitals:   03/02/23 0917  BP: 128/80  Pulse: 100  Temp: (!) 97.4 F (36.3 C)  TempSrc: Oral  SpO2: 98%  Weight: 185 lb (83.9 kg)    Body mass index is 27.32 kg/m.   Physical Exam Vitals reviewed.  Constitutional:      Appearance: Normal appearance.     Comments: Bilateral hand and facial tremors noted.  HENT:     Head: Normocephalic and atraumatic.  Eyes:     Conjunctiva/sclera: Conjunctivae normal.     Pupils: Pupils are equal, round, and reactive to light.  Cardiovascular:     Rate and Rhythm: Normal rate and regular rhythm.  Pulmonary:     Effort: Pulmonary effort is normal.     Breath sounds: Normal breath sounds.  Skin:    General: Skin is warm and dry.  Neurological:     General: No focal deficit present.     Mental Status: She is alert and oriented to person, place, and time.  Psychiatric:         Thought Content: Thought content normal.      Impression and Plan:  Dysuria  Vertigo  Tremor -     Ambulatory referral to Neurology  Immunization due   -Flu vaccine administered today. -I will refer to neurology for evaluation of her tremors.  Recent TSH was normal at 2.630. -Have offered referral to vestibular therapy for her vertigo which sounds like BPPV but she refuses. -She is unable to provide urine sample right  now.  Sterile cup has been provided and family friend has offered to bring urine later today for analysis.  Time spent:33 minutes reviewing chart, interviewing and examining patient and formulating plan of care.     Chaya Jan, MD [facility]

## 2023-03-02 NOTE — Addendum Note (Signed)
Addended by: Kern Reap B on: 03/02/2023 10:13 AM   Modules accepted: Orders

## 2023-03-04 ENCOUNTER — Other Ambulatory Visit: Payer: Self-pay | Admitting: Internal Medicine

## 2023-03-04 DIAGNOSIS — E039 Hypothyroidism, unspecified: Secondary | ICD-10-CM

## 2023-03-04 LAB — URINALYSIS, ROUTINE W REFLEX MICROSCOPIC
Bilirubin Urine: NEGATIVE
Ketones, ur: NEGATIVE
Nitrite: NEGATIVE
Specific Gravity, Urine: 1.01 (ref 1.000–1.030)
Total Protein, Urine: 30 — AB
Urine Glucose: NEGATIVE
Urobilinogen, UA: 0.2 (ref 0.0–1.0)
pH: 8.5 — AB (ref 5.0–8.0)

## 2023-03-05 LAB — URINE CULTURE
MICRO NUMBER:: 15495431
SPECIMEN QUALITY:: ADEQUATE

## 2023-03-11 ENCOUNTER — Encounter: Payer: Self-pay | Admitting: Neurology

## 2023-03-22 ENCOUNTER — Ambulatory Visit: Payer: Medicare Other | Attending: Cardiovascular Disease

## 2023-03-22 DIAGNOSIS — Z5181 Encounter for therapeutic drug level monitoring: Secondary | ICD-10-CM | POA: Diagnosis present

## 2023-03-22 DIAGNOSIS — I4821 Permanent atrial fibrillation: Secondary | ICD-10-CM | POA: Diagnosis not present

## 2023-03-22 LAB — POCT INR: INR: 1.9 — AB (ref 2.0–3.0)

## 2023-03-22 NOTE — Patient Instructions (Signed)
INCREASE TO 1 tablet daily except for 0.5 tablet on Mondays, Wednesdays and Fridays.  Call Coumadin Clinic for any changes in medications or upcoming procedures.  Recheck INR 3 weeks.   Coumadin Clinic [phone] or [phone]

## 2023-03-30 NOTE — Progress Notes (Signed)
Assessment/Plan:   1.  Parkinsons disease  -We discussed the diagnosis as well as pathophysiology of the disease.  We discussed treatment options as well as prognostic indicators.  Patient education was provided.  -We discussed that it used to be thought that levodopa would increase risk of melanoma but now it is believed that Parkinsons itself likely increases risk of melanoma. she is to get regular skin checks.  -The patient did not agree with the diagnosis.  However, as she disagreed with the diagnosis, she revealed that she was actually diagnosed with Parkinsons disease years ago and "Dr.  Eldridge Dace would not take the diagnosis off my chart."  I reviewed his notes from 2023, and he noted that she was actually started on Parkinson's medication years ago and the medication was stopped because she disagreed with the diagnosis.   I did see her a decade ago and thought she had some Parkinsonian features with amiodarone contributing to sx's but she never followed back up.  She saw [facility] neurology before me years ago and I believe that they had given her levodopa.  -We decided to add carbidopa/levodopa 25/100.  1/2 tab tid x 1 wk, then 1/2 in am & noon & 1 at night for a week, then 1/2 in am &1 at noon &night for a week, then 1 po tid at 8 AM/noon/4 PM.  Risks, benefits, side effects and alternative therapies were discussed.  The opportunity to ask questions was given and they were answered to the best of my ability.  The patient expressed understanding and willingness to follow the outlined treatment protocols.  -Unfortunately, patient declined referral to our Parkinson's therapies program.    -We discussed community resources in the area including patient support groups and community exercise programs for PD and pt education was provided to the patient.  She was given written resources, but was pretty clear that she was not going to engage with these resources.  2.  Shoulder pain  -This is very  longstanding and chronic per notes. -She is to see Ortho care and has not been there in about 2 years.  I encouraged her to follow back up.  She has seen Dr. Alvester Morin.    3.  Chronic dizziness  -On metoprolol for history of tachyarrhythmia/bradycardia arrhythmia and for A-fib control.  Will need to talk to cardiology to see if this contributes.  Certainly, the addition of levodopa can also make dizziness worse.  Her blood pressure was not low in the office today.  -This is very longstanding.  I saw her a decade ago and she refused orthostatics even back then and refused compression stockings.  I would worry currently about attempting to try other things like Florinef or midodrine, because her sitting blood pressure is already so high.  She needs to follow back up with cardiology.  She has declined vestibular rehab per primary care notes.  4.   I wanted to see the patient back in 4 months.  She did not want to follow back up as she wanted her primary care physician to prescribe all of her medicines.  Her caregiver that was with her (a Engineer, civil (consulting)) tried to convince her to come back here 1 time, and the patient at least in the room was agreeable.  It is unclear if she made a follow-up on her way out. Subjective:   Charlotte Castro was seen in consultation in the movement disorder clinic at the request of Charlotte Castro, Charlotte Castro*.  The evaluation is for  tremor.  Medical records made available to me are reviewed.  Patient is a 89 year old female with a history of A-fib, on Coumadin, hypertension, hyperlipidemia, chronic low back pain, dizziness who presents for the evaluation of tremor.  Medical records indicate that patient had declined vestibular rehab for the dizziness.    Pt with friend/caregiver/RN who supplements hx.  Tremor started approximately 2-3 years ago and involves the bilateral UE, R>L.  She has internal tremor but caregiver reports that maybe related to a-fib.  Tremor is most noticeable when eating.    There is no family hx of tremor.    Affected by caffeine:  doesn't drink much caffeine Affected by alcohol:  doesn't drink any Affected by stress:  Yes.   Affected by fatigue:  No. Spills soup if on spoon:  Yes.   Spills glass of liquid if full:  may or may not Affects ADL's (tying shoes, brushing teeth, etc):  No.  Voice: "I have a squeak now"; no hypophonia Postural symptoms:  some - and some related to SOB  Falls?  Yes.  , states that she has a "bad pain" in the leg/knees and "it takes me down without warning" Bradykinesia symptoms: difficulty getting out of a chair; no shuffle per caregiver Loss of smell:  unknown but appetite isn't good Difficulty Swallowing:  Yes.  , some but "I got no teeth and I had to stop them from pulling them" Memory changes:  No. Per pt and per caregiver; lives alone N/V:  No. Lightheaded:  Yes.    Syncope: No. But she worries about it as she has near syncope spells Diplopia:  No. Dyskinesia:  No.    Outside reports reviewed:  Patient had an MRI of the brain last in August, 2022.  I personally reviewed it.  There was advanced small vessel disease.  Allergies  Allergen Reactions   Iodinated Contrast Media Shortness Of Breath and Other (See Comments)    "Allergic," per MAR- Causes headaches, also   Zosyn [Piperacillin Sod-Tazobactam So] Other (See Comments)    "Allergic," per [facility]   Penicillins Other (See Comments)    Tolerated Zosyn Oct 2018, but "Allergic," per [facility] Did it involve swelling of the face/tongue/throat, SOB, or low BP? Unk Did it involve sudden or severe rash/hives, skin peeling, or any reaction on the inside of your mouth or nose? Unk Did you need to seek medical attention at a hospital or doctor's office? Unk When did it last happen? Unk If all above answers are "NO", may proceed with cephalosporin use.     Current Meds  Medication Sig   acetaminophen (TYLENOL) 650 MG CR tablet Take 650 mg by mouth 2 (two) times daily.    levothyroxine (SYNTHROID) 88 MCG tablet TAKE ONE TABLET BY MOUTH EVERY DAY BEFORE BREAKFAST   metoprolol tartrate (LOPRESSOR) 50 MG tablet TAKE ONE TABLET TWICE DAILY WITH A MEAL   vitamin B-12 (CYANOCOBALAMIN) 1000 MCG tablet Take 5,000 mcg by mouth 2 (two) times daily.   warfarin (COUMADIN) 5 MG tablet TAKE ONE-HALF TO ONE TABLET DAILY AS DIRECTED BY COUMADIN CLINIC      Objective:   VITALS:   Vitals:   04/04/23 1017  BP: 138/88  Pulse: 91  SpO2: 98%  Weight: 187 lb 9.6 oz (85.1 kg)    GEN:  The patient appears stated age and is in NAD. HEENT:  Normocephalic, atraumatic.  The mucous membranes are moist. The superficial temporal arteries are without ropiness or tenderness. CV:  RRR Lungs:  CTAB Neck/HEME:  There are no carotid bruits bilaterally.  Neurological examination:  Orientation: The patient is alert and oriented x3. Cranial nerves: There is good facial symmetry. The speech is fluent and clear. Soft palate rises symmetrically and there is no tongue deviation. Hearing is intact to conversational tone. Sensation: Sensation is intact to light touch throughout Motor: Strength is at least 4-/5 (diffuse give way weakness and cries out with pain, esp in the RUE).   Shoulder shrug is equal and symmetric.  There is no pronator drift.  Movement examination: Tone: There is mild to mod increased tone in the RUE.  There is normal tone elsewhere Abnormal movements: there is RUE rest tremor.  She has chin tremor.   She does have some postural tremor.  She has trouble with archimedes spirals on the right     Coordination:  There is difficult to tell decremation due to pain.   Gait and Station: The patient pushes off to arise.  She yells out in pain as she arises.  She is short stepped.  She ambulates with a 4 pronged cane.  She does not shuffle.    I have reviewed and interpreted the following labs independently   Chemistry      Component Value Date/Time   NA 143 02/07/2023 1122    K 4.4 02/07/2023 1122   CL 106 02/07/2023 1122   CO2 24 02/07/2023 1122   BUN 20 02/07/2023 1122   CREATININE 0.82 02/07/2023 1122   CREATININE 0.94 (H) 08/07/2020 1413      Component Value Date/Time   CALCIUM 9.8 02/07/2023 1122   ALKPHOS 89 02/07/2023 1122   AST 16 02/07/2023 1122   ALT 11 02/07/2023 1122   BILITOT 1.0 02/07/2023 1122      Lab Results  Component Value Date   WBC 7.7 02/07/2023   HGB 13.9 02/07/2023   HCT 41.2 02/07/2023   MCV 97 02/07/2023   PLT 172 02/07/2023   Lab Results  Component Value Date   TSH 2.630 02/07/2023      Total time spent on today's visit was 62 minutes, including both face-to-face time and nonface-to-face time.  Time included that spent on review of records (prior notes available to me/labs/imaging if pertinent), discussing treatment and goals, answering patient's questions and coordinating care.  CC:  Charlotte Castro, Limmie Patricia, MD

## 2023-04-04 ENCOUNTER — Other Ambulatory Visit: Payer: Self-pay | Admitting: Interventional Cardiology

## 2023-04-04 ENCOUNTER — Ambulatory Visit (INDEPENDENT_AMBULATORY_CARE_PROVIDER_SITE_OTHER): Payer: Medicare Other | Admitting: Neurology

## 2023-04-04 ENCOUNTER — Encounter: Payer: Self-pay | Admitting: Neurology

## 2023-04-04 VITALS — BP 138/88 | HR 91 | Wt 187.6 lb

## 2023-04-04 DIAGNOSIS — I4821 Permanent atrial fibrillation: Secondary | ICD-10-CM

## 2023-04-04 DIAGNOSIS — I10 Essential (primary) hypertension: Secondary | ICD-10-CM

## 2023-04-04 DIAGNOSIS — G20A1 Parkinson's disease without dyskinesia, without mention of fluctuations: Secondary | ICD-10-CM | POA: Diagnosis not present

## 2023-04-04 MED ORDER — CARBIDOPA-LEVODOPA 25-100 MG PO TABS: 1.0000 | ORAL_TABLET | Freq: Three times a day (TID) | ORAL | 1 refills | Status: AC

## 2023-04-04 NOTE — Patient Instructions (Addendum)
Start Carbidopa Levodopa as follows: Take 1/2 tablet three times daily, at least 30 minutes before meals (approximately 8am/noon/4pm), for one week Then take 1/2 tablet at 8am, 1/2 tablet at noon, 1 tablet at 4pm, at least 30 minutes before meals, for one week Then take 1/2 tablet in the morning, 1 tablet at noon, 1 tablet at 4pm, at least 30 minutes before meals, for one week Then take 1 tablet three times daily at 8am/12pm/4pm, at least 30 minutes before meals   As a reminder, carbidopa/levodopa can be taken at the same time as a carbohydrate, but we like to have you take your pill either 30 minutes before a protein source or 1 hour after as protein can interfere with carbidopa/levodopa absorption.   Local and Online Resources for Power over Parkinson's Group?  October 2024  ?  LOCAL [place] PARKINSON'S GROUPS??  Power over [employer] Group:???  Power Over Parkinson's Patient Education Group -NO GROUP MEETING in October due to the Parkinson's Symposium Upcoming Power over [employer] Meetings/Care Partner Support:? 2nd Wednesdays of the month at 2 pm: Next meeting November 13th Contact Amy Marriott at [email or web address] or Lynwood Dawley at [email or web address] if interested in participating in this group?  ?  LOCAL EVENTS AND NEW OFFERINGS?  Parkinson's Social Game Night.  First Thursday of each month, 2:00-4:00 pm.  Rossie Muskrat [facility], [place].  Contact [email or web address] if interested.  2024 Movement Disorders Symposium.  Friday, March 25, 2023 9 am-2 pm.  [place], [address], [place].  Free to attend; registration required.  Register at [email or web address] Parkinson's CarePartner Group for Men is in the works, if interested email Sarah ?Doctor, [email or web address]  ACT FITNESS Chair Yoga classes "Train and Gain", Fridays 10 am, [employer].  Contact Gina at [phone].   PWR! Moves [place] class!  Wednesdays at  10 am.  Please contact Lonia Blood, PT at [email or web address] if interested. Health visitor Classes offering at [employer]!? Tuesdays (Chair Yoga)  and Wednesdays (PWR! Moves)  1:00 pm.?? Contact Aldona Lento [phone] or [email or web address] Drumming for Parkinson's will be held on 2nd and 4th Mondays at 11:00 am.?? Located at the [place] of the [employer] ([address]. [place].)? Contact Albertina Parr at [email or web address] or [phone]?  [facility] [employer] Parkinson's Tai Chi Class, Mondays at 11 am.  Call [phone] for details  [facility] at [facility]- [address], [place] Wednesdays- 4:00 - 5:00 PM - specifically for Parkinson's Disease.  Free!  Contact Denny Peon, [place] - [phone] (clinic) or  [phone] (cell) or by email: [email or web address]   ?ONLINE EDUCATION AND SUPPORT?  Parkinson Foundation:? www.parkinson.org?  PD Health at Home continues:? Mindfulness Mondays, Wellness Wednesdays, Fitness Fridays??  Upcoming Education:??  Safe Movement in the Hospital.   Wednesday, October 2, 1-2 pm  Parkinson's 101:  What you and your family should know.  Wednesday, October 16th,  1-2:15 pm  Navigating Long Term Care with Parkinson's.   Tuesday, October 22nd, 1-2pm  Gene and Cell Based therapies in Parkinson's.  Wednesday, October 30th, 1-2 pm Expert Briefing:    More than PD:  Managing Multiple chronic Conditions.  Wednesday, October 9th, 1-2 pm What's on your mind?  Thinking and memory changes.  Wednesday, November 13th, 1-2 pm Register for virtual education and Photographer (webinars) at [employer].gl  Please check out their website to sign up for emails and see their full online offerings??  ?  [employer] Foundation:? www.michaeljfox.org??  Third Thursday Webinars:? On the third Thursday of every  month at 12 p.m. ET, join our free live webinars to learn about various aspects of living with Parkinson's disease and our work to speed medical breakthroughs.?  Upcoming Webinar:? Hitting Stride:  Research Advances on Walking with Parkinson's.  Thursday, October 17th  at 12 noon.  Check out additional information on their website to see their full online offerings?  ?  [employer]:? www.[email or web address].org?  Upcoming Webinar:   Stay tuned/check website Series:? Living with Parkinson's Meetup.?? Third Thursdays each month, 3 pm?  Care Partner Monthly Meetup.? With Jillene Bucks [place].? First Tuesday of each month, 2 pm?  Check out additional information to Live Well Today on their website?  ?  Parkinson and Movement Disorders (PMD) Alliance:? www.pmdalliance.org?  [employer] Online:? Online Education Events?  Sign up for emails, which are sent weekly to give you updates on programming and online offerings?  ?  Parkinson's Association of the [place]:? www.parkinsonassociation.org?  Information on online support groups, education events, and online exercises including Yoga, Parkinson's exercises and more-LOTS of information on links to PD resources and online events?  Virtual Support Group through [employer] of the [place]-October 2nd at 2 pm Save the Date:  Merchandiser, retail.  April 16, 2023 9am-4 pm. [facility].  Registration Coming soon.   MOVEMENT AND EXERCISE OPPORTUNITIES?  PWR! Moves [place] class has returned!  Wednesdays at 10 am.  Please contact Lonia Blood, PT at [email or web address] if interested. Parkinson's Exercise Class offerings at [employer]. Tuesdays (Chair yoga) and Wednesdays (PWR! Moves)  1:00 pm.?  Contact Aldona Lento [phone] or [email or web address]  Parkinson's Wellness Recovery (PWR! Moves)? www.pwr4life.org?  Info on the PWR! Virtual Experience:? You will have access to our  expertise?through self-assessment, guided plans that start with the PD-specific fundamentals, educational content, tips, Q&A with an expert, and a growing Engineering geologist of PD-specific pre-recorded and live exercise classes of varying types and intensity - both physical and cognitive! If that is not enough, we offer 1:1 wellness consultations (in-person or virtual) to personalize your PWR! Dance movement psychotherapist.??  Parkinson [employer] Fridays:??  As part of the PD Health @ Home program, this free video series focuses each week on one aspect of fitness designed to support people living with Parkinson's.? These weekly videos highlight the Parkinson Foundation fitness guidelines for people with Parkinson's disease.?  MenusLocal.com.br?  Dance for PD website is offering free, live-stream classes throughout the week, as well as links to [employer] of classes:? https://danceforparkinsons.org/?  Virtual dance and Pilates for Parkinson's classes: Click on the Community Tab> Parkinson's Movement Initiative Tab.? To register for classes and for more information, visit www.[email or web address].co.za and click the "community" tab.??  [employer] Parkinson's Cycling Classes??  [facility]:? Thursdays @ Noon-Live classes at [employer] ([employer] at [email or web address]?or [phone])?  [facility]: Classes Tuesday, Wednesday and Thursday (contact [place] at [email or web address] ?or [phone])?  [employer]?  Varied levels of classes are offered Tuesdays and Thursdays at [facility], [place].??  Stretching with Byrd Hesselbach weekly class is also offered for people with Parkinson's?  To observe a class or for more information, call [phone] or email Patricia Nettle at [email or web address]?    ADDITIONAL SUPPORT AND RESOURCES?  [facility] [employer]:  Chiropractor:?  www.well-springsolutions.org/caregiver-education/caregiver-support-group.? You may also contact Loleta Chance at [facility] -spring.org or [phone].????  [facility]Spring Navigator:? Just1Navigator program, a?free service to help individuals and families through the journey of determining care for older adults.? The "Navigator" is  a Child psychotherapist, Sidney Ace, who will speak with a prospective client and/or loved ones to provide an assessment of the situation and a set of recommendations for a personalized care plan -- all free of charge, and whether?[facility] [employer] offers the needed service or not. If the need is not a service we provide, we are well-connected with reputable programs in town that we can refer you to.? www.well-springsolutions.org or to speak with the Navigator, call [phone].?

## 2023-04-05 ENCOUNTER — Other Ambulatory Visit: Payer: Self-pay

## 2023-04-05 DIAGNOSIS — I10 Essential (primary) hypertension: Secondary | ICD-10-CM

## 2023-04-05 DIAGNOSIS — I4821 Permanent atrial fibrillation: Secondary | ICD-10-CM

## 2023-04-05 MED ORDER — METOPROLOL TARTRATE 50 MG PO TABS
ORAL_TABLET | ORAL | 2 refills | Status: AC
Start: 2023-04-05 — End: ?

## 2023-04-12 ENCOUNTER — Ambulatory Visit: Payer: Medicare Other | Attending: Internal Medicine

## 2023-04-12 DIAGNOSIS — I4821 Permanent atrial fibrillation: Secondary | ICD-10-CM | POA: Diagnosis not present

## 2023-04-12 DIAGNOSIS — Z5181 Encounter for therapeutic drug level monitoring: Secondary | ICD-10-CM

## 2023-04-12 LAB — POCT INR: INR: 2.6 (ref 2.0–3.0)

## 2023-04-12 NOTE — Patient Instructions (Signed)
continue 1 tablet daily except for 0.5 tablet on Mondays, Wednesdays and Fridays.  Call Coumadin Clinic for any changes in medications or upcoming procedures.  Recheck INR 4 weeks.   [facility] Clinic [phone] or [phone]

## 2023-04-29 ENCOUNTER — Emergency Department (HOSPITAL_COMMUNITY): Payer: Medicare Other

## 2023-04-29 ENCOUNTER — Encounter (HOSPITAL_COMMUNITY): Payer: Self-pay | Admitting: Emergency Medicine

## 2023-04-29 ENCOUNTER — Inpatient Hospital Stay (HOSPITAL_COMMUNITY)
Admission: EM | Admit: 2023-04-29 | Discharge: 2023-05-02 | DRG: 563 | Disposition: A | Discharge status: Skilled Nursing Facility | Payer: Medicare Other | Attending: Internal Medicine | Admitting: Internal Medicine

## 2023-04-29 ENCOUNTER — Other Ambulatory Visit: Payer: Self-pay

## 2023-04-29 DIAGNOSIS — R251 Tremor, unspecified: Secondary | ICD-10-CM | POA: Diagnosis present

## 2023-04-29 DIAGNOSIS — I4821 Permanent atrial fibrillation: Secondary | ICD-10-CM | POA: Diagnosis present

## 2023-04-29 DIAGNOSIS — S42309A Unspecified fracture of shaft of humerus, unspecified arm, initial encounter for closed fracture: Secondary | ICD-10-CM

## 2023-04-29 DIAGNOSIS — W1830XA Fall on same level, unspecified, initial encounter: Secondary | ICD-10-CM | POA: Diagnosis present

## 2023-04-29 DIAGNOSIS — G8929 Other chronic pain: Secondary | ICD-10-CM | POA: Diagnosis present

## 2023-04-29 DIAGNOSIS — M129 Arthropathy, unspecified: Secondary | ICD-10-CM

## 2023-04-29 DIAGNOSIS — Z888 Allergy status to other drugs, medicaments and biological substances status: Secondary | ICD-10-CM

## 2023-04-29 DIAGNOSIS — M6282 Rhabdomyolysis: Secondary | ICD-10-CM | POA: Diagnosis present

## 2023-04-29 DIAGNOSIS — K219 Gastro-esophageal reflux disease without esophagitis: Secondary | ICD-10-CM | POA: Diagnosis present

## 2023-04-29 DIAGNOSIS — I110 Hypertensive heart disease with heart failure: Secondary | ICD-10-CM | POA: Diagnosis present

## 2023-04-29 DIAGNOSIS — S42201A Unspecified fracture of upper end of right humerus, initial encounter for closed fracture: Secondary | ICD-10-CM | POA: Diagnosis not present

## 2023-04-29 DIAGNOSIS — E785 Hyperlipidemia, unspecified: Secondary | ICD-10-CM | POA: Diagnosis present

## 2023-04-29 DIAGNOSIS — Z9189 Other specified personal risk factors, not elsewhere classified: Secondary | ICD-10-CM

## 2023-04-29 DIAGNOSIS — Z91041 Radiographic dye allergy status: Secondary | ICD-10-CM

## 2023-04-29 DIAGNOSIS — Z79899 Other long term (current) drug therapy: Secondary | ICD-10-CM

## 2023-04-29 DIAGNOSIS — F419 Anxiety disorder, unspecified: Secondary | ICD-10-CM | POA: Diagnosis present

## 2023-04-29 DIAGNOSIS — Z7901 Long term (current) use of anticoagulants: Secondary | ICD-10-CM

## 2023-04-29 DIAGNOSIS — C541 Malignant neoplasm of endometrium: Secondary | ICD-10-CM | POA: Diagnosis present

## 2023-04-29 DIAGNOSIS — Z8542 Personal history of malignant neoplasm of other parts of uterus: Secondary | ICD-10-CM

## 2023-04-29 DIAGNOSIS — I10 Essential (primary) hypertension: Secondary | ICD-10-CM

## 2023-04-29 DIAGNOSIS — I5032 Chronic diastolic (congestive) heart failure: Secondary | ICD-10-CM | POA: Diagnosis present

## 2023-04-29 DIAGNOSIS — W19XXXA Unspecified fall, initial encounter: Secondary | ICD-10-CM

## 2023-04-29 DIAGNOSIS — M545 Low back pain, unspecified: Secondary | ICD-10-CM | POA: Diagnosis present

## 2023-04-29 DIAGNOSIS — Y92009 Unspecified place in unspecified non-institutional (private) residence as the place of occurrence of the external cause: Secondary | ICD-10-CM

## 2023-04-29 DIAGNOSIS — Z8249 Family history of ischemic heart disease and other diseases of the circulatory system: Secondary | ICD-10-CM

## 2023-04-29 DIAGNOSIS — Z79890 Hormone replacement therapy (postmenopausal): Secondary | ICD-10-CM

## 2023-04-29 DIAGNOSIS — I482 Chronic atrial fibrillation, unspecified: Secondary | ICD-10-CM

## 2023-04-29 DIAGNOSIS — Z880 Allergy status to penicillin: Secondary | ICD-10-CM

## 2023-04-29 DIAGNOSIS — E039 Hypothyroidism, unspecified: Secondary | ICD-10-CM | POA: Diagnosis present

## 2023-04-29 DIAGNOSIS — R52 Pain, unspecified: Secondary | ICD-10-CM

## 2023-04-29 LAB — CBC WITH DIFFERENTIAL/PLATELET
Abs Immature Granulocytes: 0.11 10*3/uL — ABNORMAL HIGH (ref 0.00–0.07)
Basophils Absolute: 0 10*3/uL (ref 0.0–0.1)
Basophils Relative: 0 %
Eosinophils Absolute: 0 10*3/uL (ref 0.0–0.5)
Eosinophils Relative: 0 %
HCT: 42 % (ref 36.0–46.0)
Hemoglobin: 15 g/dL (ref 12.0–15.0)
Immature Granulocytes: 1 %
Lymphocytes Relative: 5 %
Lymphs Abs: 0.9 10*3/uL (ref 0.7–4.0)
MCH: 33 pg (ref 26.0–34.0)
MCHC: 35.7 g/dL (ref 30.0–36.0)
MCV: 92.3 fL (ref 80.0–100.0)
Monocytes Absolute: 1.3 10*3/uL — ABNORMAL HIGH (ref 0.1–1.0)
Monocytes Relative: 7 %
Neutro Abs: 15 10*3/uL — ABNORMAL HIGH (ref 1.7–7.7)
Neutrophils Relative %: 87 %
Platelets: 158 10*3/uL (ref 150–400)
RBC: 4.55 MIL/uL (ref 3.87–5.11)
RDW: 12.9 % (ref 11.5–15.5)
WBC: 17.3 10*3/uL — ABNORMAL HIGH (ref 4.0–10.5)
nRBC: 0 % (ref 0.0–0.2)

## 2023-04-29 LAB — COMPREHENSIVE METABOLIC PANEL
ALT: 22 U/L (ref 0–44)
AST: 39 U/L (ref 15–41)
Albumin: 3.6 g/dL (ref 3.5–5.0)
Alkaline Phosphatase: 67 U/L (ref 38–126)
Anion gap: 12 (ref 5–15)
BUN: 25 mg/dL — ABNORMAL HIGH (ref 8–23)
CO2: 21 mmol/L — ABNORMAL LOW (ref 22–32)
Calcium: 9.4 mg/dL (ref 8.9–10.3)
Chloride: 107 mmol/L (ref 98–111)
Creatinine, Ser: 1.14 mg/dL — ABNORMAL HIGH (ref 0.44–1.00)
GFR, Estimated: 46 mL/min — ABNORMAL LOW (ref >60)
Glucose, Bld: 133 mg/dL — ABNORMAL HIGH (ref 70–99)
Potassium: 4 mmol/L (ref 3.5–5.1)
Sodium: 140 mmol/L (ref 135–145)
Total Bilirubin: 2.6 mg/dL — ABNORMAL HIGH (ref <1.2)
Total Protein: 6.5 g/dL (ref 6.5–8.1)

## 2023-04-29 LAB — CK: Total CK: 707 U/L — ABNORMAL HIGH (ref 38–234)

## 2023-04-29 LAB — PROTIME-INR
INR: 1.7 — ABNORMAL HIGH (ref 0.8–1.2)
Prothrombin Time: 20.2 s — ABNORMAL HIGH (ref 11.4–15.2)

## 2023-04-29 MED ORDER — METOPROLOL TARTRATE 5 MG/5ML IV SOLN: 5.0000 mg | INTRAVENOUS | Status: DC | PRN

## 2023-04-29 MED ORDER — OXYCODONE HCL 5 MG PO TABS
5.0000 mg | ORAL_TABLET | ORAL | Status: DC | PRN
  Administered 2023-04-30 – 2023-05-02 (×7): 5 mg via ORAL
  Filled 2023-04-29 (×7): qty 1

## 2023-04-29 MED ORDER — HYDROMORPHONE HCL 1 MG/ML IJ SOLN
0.5000 mg | INTRAMUSCULAR | Status: DC | PRN
  Administered 2023-04-30: 0.5 mg via INTRAVENOUS
  Filled 2023-04-29: qty 0.5

## 2023-04-29 MED ORDER — METOPROLOL TARTRATE 50 MG PO TABS
50.0000 mg | ORAL_TABLET | Freq: Two times a day (BID) | ORAL | Status: DC
  Administered 2023-04-30 – 2023-05-02 (×6): 50 mg via ORAL
  Filled 2023-04-29 (×2): qty 1
  Filled 2023-04-29: qty 2
  Filled 2023-04-29 (×3): qty 1

## 2023-04-29 MED ORDER — METOPROLOL TARTRATE 5 MG/5ML IV SOLN
5.0000 mg | Freq: Once | INTRAVENOUS | Status: AC
  Administered 2023-04-29: 5 mg via INTRAVENOUS
  Filled 2023-04-29: qty 5

## 2023-04-29 MED ORDER — SODIUM CHLORIDE 0.9 % IV BOLUS
500.0000 mL | Freq: Once | INTRAVENOUS | Status: AC
  Administered 2023-04-29: 500 mL via INTRAVENOUS

## 2023-04-29 MED ORDER — ONDANSETRON HCL 4 MG/2ML IJ SOLN
4.0000 mg | Freq: Once | INTRAMUSCULAR | Status: AC
  Administered 2023-04-29: 4 mg via INTRAVENOUS
  Filled 2023-04-29: qty 2

## 2023-04-29 MED ORDER — WARFARIN SODIUM 5 MG PO TABS
5.0000 mg | ORAL_TABLET | Freq: Once | ORAL | Status: AC
  Administered 2023-04-30: 5 mg via ORAL
  Filled 2023-04-29: qty 1

## 2023-04-29 MED ORDER — WARFARIN - PHARMACIST DOSING INPATIENT: Freq: Every day | Status: DC

## 2023-04-29 MED ORDER — MORPHINE SULFATE (PF) 2 MG/ML IV SOLN
2.0000 mg | Freq: Once | INTRAVENOUS | Status: AC
Start: 2023-04-29 — End: 2023-04-29
  Administered 2023-04-29: 2 mg via INTRAVENOUS
  Filled 2023-04-29: qty 1

## 2023-04-29 MED ORDER — ACETAMINOPHEN 325 MG PO TABS
650.0000 mg | ORAL_TABLET | Freq: Four times a day (QID) | ORAL | Status: DC
  Administered 2023-04-30 – 2023-05-02 (×9): 650 mg via ORAL
  Filled 2023-04-29 (×10): qty 2

## 2023-04-29 NOTE — ED Provider Notes (Signed)
[place] EMERGENCY DEPARTMENT AT [facility] Provider Note   CSN: [id number] Arrival date & time: 04/29/23  1013     History  Chief Complaint  Patient presents with   Fall   Shoulder Injury    Charlotte Castro is a 89 y.o. female past medical history significant for hypertension, CHF, permanent A-fib, delirium, tremor, presents today after mechanical fall yesterday around 1 PM.  Patient states she normally uses a cane to ambulate.  Patient was on the floor since the fall yesterday and was found by her son this morning.  Patient is complaining of right shoulder pain and limited range of motion in affected extremity.  Patient also complains of right parietal head pain pain where she states that her head hit the door frame.  Patient also endorses neck and back pain.  Patient denies loss of consciousness.  Patient endorses blood thinners.   Fall  Shoulder Injury       Home Medications Prior to Admission medications   Medication Sig Start Date End Date Taking? Authorizing Provider  acetaminophen (TYLENOL) 650 MG CR tablet Take 650 mg by mouth 2 (two) times daily.    [provider]  carbidopa-levodopa (SINEMET IR) 25-100 MG tablet Take 1 tablet by mouth 3 (three) times daily. 8am/noon/4pm 04/04/23   Tat, Octaviano Batty, DO  cholecalciferol (VITAMIN D3) 25 MCG (1000 UNIT) tablet Take 1,000 Units by mouth daily. Patient not taking: Reported on 04/04/2023    [provider]  gabapentin (NEURONTIN) 100 MG capsule TAKE ONE CAPSULE BY MOUTH TWICE DAILY Patient not taking: Reported on 04/04/2023 02/24/22   Philip Aspen, Limmie Patricia, MD  levothyroxine (SYNTHROID) 88 MCG tablet TAKE ONE TABLET BY MOUTH EVERY DAY BEFORE BREAKFAST 03/07/23   Philip Aspen, Limmie Patricia, MD  lidocaine (LIDODERM) 5 % Place 1 patch onto the skin daily. Remove & Discard patch within 12 hours or as directed by MD Patient not taking: Reported on 04/04/2023 04/11/22   Palumbo, April, MD   metoprolol tartrate (LOPRESSOR) 50 MG tablet TAKE ONE TABLET TWICE DAILY WITH A MEAL 04/06/23   Corky Crafts, MD  metoprolol tartrate (LOPRESSOR) 50 MG tablet TAKE ONE TABLET TWICE DAILY WITH A MEAL 04/05/23   Dyann Kief, PA-C  ondansetron (ZOFRAN-ODT) 8 MG disintegrating tablet 8mg  ODT q8 hours prn nausea Patient not taking: Reported on 04/04/2023 04/11/22   Palumbo, April, MD  pantoprazole (PROTONIX) 20 MG tablet Take 1 tablet (20 mg total) by mouth 2 (two) times daily before a meal. Patient not taking: Reported on 04/04/2023 04/14/22 04/14/23  Pokhrel, Rebekah Chesterfield, MD  vitamin B-12 (CYANOCOBALAMIN) 1000 MCG tablet Take 5,000 mcg by mouth 2 (two) times daily.    [provider]  warfarin (COUMADIN) 5 MG tablet TAKE ONE-HALF TO ONE TABLET DAILY AS DIRECTED BY COUMADIN CLINIC 12/20/22   Corky Crafts, MD      Allergies    Iodinated contrast media, Zosyn [piperacillin sod-tazobactam so], and Penicillins    Review of Systems   Review of Systems  Musculoskeletal:  Positive for arthralgias, back pain and neck pain.    Physical Exam Updated Vital Signs There were no vitals taken for this visit. Physical Exam Vitals and nursing note reviewed.  Constitutional:      General: She is not in acute distress.    Appearance: She is well-developed.  HENT:     Head: Normocephalic and atraumatic. No raccoon eyes or Battle's sign.     Jaw: There is normal jaw  occlusion.     Right Ear: External ear normal.     Left Ear: External ear normal.     Mouth/Throat:     Mouth: Mucous membranes are moist.  Eyes:     Extraocular Movements: Extraocular movements intact.     Conjunctiva/sclera: Conjunctivae normal.     Pupils: Pupils are equal, round, and reactive to light.  Cardiovascular:     Rate and Rhythm: Normal rate and regular rhythm.     Heart sounds: No murmur heard. Pulmonary:     Effort: Pulmonary effort is normal. No respiratory distress.     Breath sounds: Normal  breath sounds.  Abdominal:     Palpations: Abdomen is soft.     Tenderness: There is no abdominal tenderness.  Musculoskeletal:        General: No swelling.     Cervical back: Neck supple. Tenderness present.     Comments: Diffuse tenderness along spine in C-spine, thoracic, and lumbar regions.  Patient has large ecchymosis to right shoulder.  Patient's radial pulses are intact bilaterally.  Range of motion in right shoulder is limited due to pain but patient is able to move fingers and squeeze hand.  Sensation is intact.  Patient has peripheral vascular disease and weak lateral dorsalis pedis pulses which she states is chronic  Skin:    General: Skin is warm and dry.     Capillary Refill: Capillary refill takes less than 2 seconds.  Neurological:     Mental Status: She is alert.  Psychiatric:        Mood and Affect: Mood normal.     ED Results / Procedures / Treatments   Labs (all labs ordered are listed, but only abnormal results are displayed) Labs Reviewed  COMPREHENSIVE METABOLIC PANEL  CBC WITH DIFFERENTIAL/PLATELET  PROTIME-INR  CK    EKG None  Radiology No results found.  Procedures Procedures    Medications Ordered in ED Medications  morphine (PF) 2 MG/ML injection 2 mg (has no administration in time range)  ondansetron (ZOFRAN) injection 4 mg (has no administration in time range)    ED Course/ Medical Decision Making/ A&P Clinical Course as of 04/29/23 1337  Fri Apr 29, 2023  1327 Fall, prox humerus. Ortho and back to  SNF. [CC]    Clinical Course User Index [CC] Glyn Ade, MD                                 Medical Decision Making Amount and/or Complexity of Data Reviewed Labs: ordered. Radiology: ordered.  Risk Prescription drug management.   This patient presents to the ED with chief complaint(s) of fall on thinners with pertinent past medical history of A-fib which further complicates the presenting complaint. The complaint  involves an extensive differential diagnosis and also carries with it a high risk of complications and morbidity.    The differential diagnosis includes right humerus fracture, concussion, musculoskeletal pain, vertebral fracture  Additional history obtained: Additional history obtained from family Records reviewed Primary Care Documents  ED Course and Reassessment:   Independent labs interpretation:  The following labs were independently interpreted:  EKG: Sinus tach w/ irregular rate CBC: Leukocytosis at 17.3 CK: 707 CMP: Decreased CO2 at 21 and elevated BUN at 25 Pro time-INR: 20.2 and 1.7  Independent visualization of imaging: - I independently visualized the following imaging with scope of interpretation limited to determining acute life threatening conditions related to emergency  care:  Right shoulder x-ray: Comminuted, displaced, and impacted fracture of the right proximal humerus at the level of the surgical neck CT C-spine w/o: No acute fracture or traumatic subluxation, multilevel degenerative disc disease CT head without contrast: No acute intracranial abnormality or significant interval change, advanced atrophy and white matter disease similar to prior exams CT lumbar spine without contrast: No acute fracture or traumatic subluxation CT chest without contrast: Pending  Consultation: - Consulted or discussed management/test interpretation w/ external professional: Charma Igo with orthopedics who recommended sling and nonweightbearing and follow-up with Dr. Christell Constant next week in clinic.  Consideration for admission or further workup: Signed out to Karmen Stabs, MD at shift change pending image reads        Final Clinical Impression(s) / ED Diagnoses Final diagnoses:  None    Rx / DC Orders ED Discharge Orders     None         Dolphus Jenny, PA-C 04/29/23 1506    Glyn Ade, MD 05/02/23 1502

## 2023-04-29 NOTE — ED Notes (Signed)
Daughter requesting to be notified for updates.

## 2023-04-29 NOTE — ED Notes (Signed)
Pt HR increased to 140s in Afib, Eloise Harman MD made aware.

## 2023-04-29 NOTE — Progress Notes (Signed)
Orthopedic Tech Progress Note Patient Details:  ALICEMAE ROED 1932/12/29 [id number]  Ortho Devices Type of Ortho Device: Shoulder immobilizer Ortho Device/Splint Location: RUE Ortho Device/Splint Interventions: Ordered, Application, Adjustment   Post Interventions Patient Tolerated: Fair Instructions Provided: Adjustment of device, Care of device  Diannia Ruder 04/29/2023, 4:02 PM

## 2023-04-29 NOTE — ED Triage Notes (Signed)
Pt arrives via EMS from home with mechanical fall yesterday around 1pm walking back from the bathroom. Pt normally uses a cane to ambulate. Pt was on the floor since the fall yesterday. Found by son this morning. Pts only complaint is right shoulder pain. Limited ROM in affect extremity. New onset AFIB RVR per EMS. Initial bp 90/40 repeat 164/94.

## 2023-04-29 NOTE — ED Provider Notes (Signed)
Physical Exam  BP (!) 145/77 (BP Location: Left Arm)   Pulse 97   Temp 97.9 F (36.6 C) (Oral)   Resp 18   SpO2 100%   Physical Exam  Procedures  Procedures  ED Course / MDM   Clinical Course as of 04/29/23 2141  [facility] Apr 29, 2023  1327 Fall, prox humerus. Ortho and back to  SNF. [CC]  1506 F/u CT chest, hip X ray and d/c with sling/ortho f/u  [GD]  2013 F/u imaging. If negative try to walk. If non ambulatory admit for Pt/OT and for monitoring post AF RVR, for mild  lab derangements [GD]  2112 DG Knee 2 Views Right [GD]  2112 Imaging is negative  [GD]    Clinical Course User Index [CC] Glyn Ade, MD [GD] Karmen Stabs, MD   Medical Decision Making Amount and/or Complexity of Data Reviewed Labs: ordered. Radiology: ordered. Decision-making details documented in ED Course.  Risk Prescription drug management. Decision regarding hospitalization.   Handoff received from off going provider.  Briefly this is an 89 year old female who fell yesterday and was on the ground for 15 hours.  She has a broken humerus for which orthopedics Dr. Tinnie Gens was consulted and plans for sling, no splint, and clinic follow-up on Monday which has been arranged.  She had some chest and spinal tenderness on exam therefore a CT chest was ordered and is pending.  Labs show mild CK elevation but not meeting criteria for rhabdo.  Also reactive leukocytosis.  Otherwise labs are reassuring.  Plan is to follow-up CT chest.   Chest CT resulted with no acute traumatic findings in the chest, independently reviewed.   I reassessed the patient.  She is complaining of lower extremity pain with right worse than left.  I added a pelvic x-ray since she has not ambulated.  This showed no hip fracture but possible right acetabular fracture.  I added a CT of her hip as well as XR imaging of the right lower extremity.   Was then called to bedside for patient going into A-fib RVR up to 150.  Pressure normal.   Mentating normally.  She has a history of A-fib and is anticoagulated and on metoprolol at home.  She was given 500 cc of fluids as well as 5 mg of IV metoprolol.  On later reassessment she is back in NSR. Suspect Afib event was due to acute stress response and/or pain.   I updated the patient on results of her CT.  This shows no fracture to the acetabulum or hip.  I personally reviewed and independently interpreted the CT as well as x-rays of her right lower extremity including ankle, tib-fib, knee, femur.  These show no acute fracture or acute abnormality.  While I was reassessing the patient she is again in A-fib RVR at a rate of 120.  Blood pressure is normal.  Mentating normally.  Given additional 5 mg of IV metoprolol. Rate slowed to 90s-100. At this point I gave her a dose of her home medication which is lopressor 50 mg BID.   I suspect she is going into Afib due to pain. She does not have signs/symptoms of sepsis infection as trigger.  Given this has happened numerous times in the ED up to a rate of 150 she would not be a safe discharge without further observation, medications as needed, pain control even if she was able to ambulate.  She was attempting an ambulation trial with nursing staff. Unable to ambulate.  Will admit to hospitalist for pain control, observation, A-fib management, PT OT. Accepted by Dr. Joneen Roach.      Karmen Stabs, MD 04/29/23 1610    Rondel Baton, MD 04/30/23 [phone]

## 2023-04-29 NOTE — H&P (Incomplete)
PCP:   Philip Aspen, Limmie Patricia, MD   Chief Complaint:  Fall  HPI: This is a pleasant 89 year old female with past medical history of HTN, arthritis, A-fib, chronic diastolic heart failure, hypothyroidism and recurrent UTIs.  Per patient she went to the kitchen for glass of water.  On exiting she turned felt a pain in her knee went down.  Per patient she has this knee intermittently related to her chronic arthritis.  She denies being lightheaded prior, chest pains, or any prior illnesses.  It is unclear if there was loss of consciousness.  Patient remained on the floor for approximately 15 hours until her son found her.  She was brought to the ER.  In the ER presenting vitals were stable.  Creatinine 1.14 [baseline normal], total CK7 07, WBC 17.3. CT chest shows comminuted comminuted, displaced, and impacted fracture of the right proximal humerus at the level of the surgical necks. Patient with known history of A-fib.  Patient intermittently going into A-fib with RVR.  Treated with IV Lopressor.  Admission requested.  Review of Systems:  Per HPI  Past Medical History: Past Medical History:  Diagnosis Date   Anemia    history of   Anxiety    Arthritis    "left knee" (04/05/2017)   Atrial fibrillation (HCC)    Atrial flutter (HCC)    typical appearing   Atrial tachycardia (HCC)    ablated 11/17/10  by JA  from the [facility] of the aorta   CHF (congestive heart failure) (HCC)    Chronic lower back pain    Chronic nausea    Endometrial cancer (HCC)    grade 1   Gallstone pancreatitis    Gastritis    GERD (gastroesophageal reflux disease)    History of hiatal hernia    HTN (hypertension)    Hyperlipemia    Hypothyroidism    Obesity    Osteoporosis    Toe ulcer (HCC)    left 3rd toe   Vitamin D deficiency    Past Surgical History:  Procedure Laterality Date   ATRIAL ABLATION SURGERY  11/17/10   Atrial tachycardia arising from [facility] of the aorta ablated by [facility] SURGERY      CARDIAC CATHETERIZATION  01/11/2011   [name] 01/12/2011   CHOLECYSTECTOMY N/A 04/08/2017   Procedure: LAPAROSCOPIC CHOLECYSTECTOMY;  Surgeon: Violeta Gelinas, MD;  Location: [facility] OR;  Service: General;  Laterality: N/A;   FRACTURE SURGERY     HYSTEROSCOPY WITH D & C N/A 08/05/2017   Procedure: DILATATION AND CURETTAGE /HYSTEROSCOPY AND POLYPECTOMY;  Surgeon: Tereso Newcomer, MD;  Location: [facility] ORS;  Service: Gynecology;  Laterality: N/A;   LUMBAR SPINE SURGERY  ~ 1998   ROBOTIC ASSISTED TOTAL HYSTERECTOMY WITH BILATERAL SALPINGO OOPHERECTOMY Bilateral 09/20/2017   Procedure: XI ROBOTIC ASSISTED TOTAL HYSTERECTOMY WITH BILATERAL SALPINGO OOPHORECTOMY, SENTINAL LYMPH NODE BIOPSY;  Surgeon: Adolphus Birchwood, MD;  Location: [facility] ORS;  Service: Gynecology;  Laterality: Bilateral;   SHOULDER SURGERY Right 1984 X2   /ntoes 01/19/2011   WRIST FRACTURE SURGERY Left 1983    Medications: Prior to Admission medications   Medication Sig Start Date End Date Taking? Authorizing Provider  acetaminophen (TYLENOL) 650 MG CR tablet Take 650 mg by mouth daily.   Yes [provider]  carbidopa-levodopa (SINEMET IR) 25-100 MG tablet Take 1 tablet by mouth 3 (three) times daily. 8am/noon/4pm 04/04/23  Yes Tat, Octaviano Batty, DO  levothyroxine (SYNTHROID) 88 MCG tablet TAKE ONE TABLET BY MOUTH EVERY  DAY BEFORE BREAKFAST 03/07/23  Yes Philip Aspen, Limmie Patricia, MD  metoprolol tartrate (LOPRESSOR) 50 MG tablet TAKE ONE TABLET TWICE DAILY WITH A MEAL 04/06/23  Yes Corky Crafts, MD  vitamin B-12 (CYANOCOBALAMIN) 1000 MCG tablet Take 1,000 mcg by mouth daily.   Yes [provider]  warfarin (COUMADIN) 5 MG tablet TAKE ONE-HALF TO ONE TABLET DAILY AS DIRECTED BY COUMADIN CLINIC Patient taking differently: Take 5 mg by mouth at bedtime. Take 1 tablet Mon, Wed, and Fri. Take one-half tablet Sun, Tues, Thurs, and Sat. As DIRECTED BY COUMADIN CLINIC 12/20/22  Yes Corky Crafts, MD  gabapentin (NEURONTIN) 100 MG  capsule TAKE ONE CAPSULE BY MOUTH TWICE DAILY 02/24/22   Philip Aspen, Limmie Patricia, MD  lidocaine (LIDODERM) 5 % Place 1 patch onto the skin daily. Remove & Discard patch within 12 hours or as directed by MD 04/11/22   Nicanor Alcon, April, MD  metoprolol tartrate (LOPRESSOR) 50 MG tablet TAKE ONE TABLET TWICE DAILY WITH A MEAL 04/05/23   Dyann Kief, PA-C    Allergies:   Allergies  Allergen Reactions   Iodinated Contrast Media Shortness Of Breath and Other (See Comments)    "Allergic," per [facility]- Causes headaches, also   Zosyn [Piperacillin Sod-Tazobactam So] Other (See Comments)    "Allergic," per [facility]   Penicillins Other (See Comments)    Tolerated Zosyn Oct 2018, but "Allergic," per [facility] Did it involve swelling of the face/tongue/throat, SOB, or low BP? Unk Did it involve sudden or severe rash/hives, skin peeling, or any reaction on the inside of your mouth or nose? Unk Did you need to seek medical attention at a hospital or doctor's office? Unk When did it last happen? Unk If all above answers are "NO", may proceed with cephalosporin use.     Social History:  reports that she has never smoked. She has never used smokeless tobacco. She reports that she does not drink alcohol and does not use drugs.  Family History: Family History  Problem Relation Age of Onset   Hypertension Mother    Heart attack Father    Hypertension Father     Physical Exam: Vitals:   04/29/23 2215 04/29/23 2230 04/29/23 2300 04/29/23 2330  BP: 139/64 120/70 (!) 148/78 110/87  Pulse: 99 99 (!) 103 (!) 107  Resp: 16 20 19 19   Temp:      TempSrc:      SpO2: 99% 99% 100% 99%    General:  A&O x3, frail elderly lady, no acute distress Eyes: Pink conjunctiva, no scleral icterus ENT: Moist oral mucosa, neck supple, no thyromegaly Lungs: clear to ascultation, no wheeze, no crackles, no use of accessory muscles Cardiovascular: tachy irregular rate and irregular rhythm, no regurgitation, no gallops, no  murmurs.  Abdomen: soft, positive BS, NTND, no organomegaly, not an acute abdomen GU: not examined Neuro: CN II - XII grossly intact, sensation intact Musculoskeletal: Tenderness w/ rotation right shoulder, no clubbing, cyanosis or edema Skin: no rash, no subcutaneous crepitation, no decubitus Psych: appropriate patient  Labs on Admission:  Recent Labs    04/29/23 1129  NA 140  K 4.0  CL 107  CO2 21*  GLUCOSE 133*  BUN 25*  CREATININE 1.14*  CALCIUM 9.4   Recent Labs    04/29/23 1129  AST 39  ALT 22  ALKPHOS 67  BILITOT 2.6*  PROT 6.5  ALBUMIN 3.6    Recent Labs    04/29/23 1129  WBC 17.3*  NEUTROABS 15.0*  HGB 15.0  HCT 42.0  MCV 92.3  PLT 158   Recent Labs    04/29/23 1129  CKTOTAL 707*     Micro Results: No results found for this or any previous visit (from the past 240 hour(s)).   Radiological Exams on Admission: DG Ankle Complete Right  Result Date: 04/29/2023 CLINICAL DATA:  Fall yesterday with right ankle pain, initial encounter EXAM: RIGHT ANKLE - COMPLETE 3+ VIEW COMPARISON:  None Available. FINDINGS: There is no evidence of fracture, dislocation, or joint effusion. There is no evidence of arthropathy or other focal bone abnormality. Soft tissues are unremarkable. IMPRESSION: No acute abnormality noted. Electronically Signed   By: Alcide Clever M.D.   On: 04/29/2023 21:13   DG Femur Min 2 Views Right  Result Date: 04/29/2023 CLINICAL DATA:  Fall yesterday with persistent right leg pain, initial encounter EXAM: RIGHT FEMUR 2 VIEWS COMPARISON:  None Available. FINDINGS: Degenerative changes of the right hip joint are noted. No acute fracture or dislocation is seen. Degenerative changes about the knee joint are noted as well. No soft tissue abnormality is seen. IMPRESSION: Degenerative change without acute abnormality. Electronically Signed   By: Alcide Clever M.D.   On: 04/29/2023 21:07   DG Tibia/Fibula Right  Result Date: 04/29/2023 CLINICAL  DATA:  Fall yesterday with right lower leg pain, initial encounter EXAM: RIGHT TIBIA AND FIBULA - 2 VIEW COMPARISON:  None Available. FINDINGS: There is no evidence of fracture or other focal bone lesions. Soft tissues are unremarkable. IMPRESSION: No acute abnormality noted. Electronically Signed   By: Alcide Clever M.D.   On: 04/29/2023 21:07   DG Knee 2 Views Right  Result Date: 04/29/2023 CLINICAL DATA:  Recent fall yesterday with right knee pain, initial encounter EXAM: RIGHT KNEE - 2 VIEW COMPARISON:  None Available. FINDINGS: Tricompartmental degenerative changes are noted. No joint effusion is seen. No acute fracture or dislocation is noted. IMPRESSION: Degenerative change without acute abnormality. Electronically Signed   By: Alcide Clever M.D.   On: 04/29/2023 21:06   CT Hip Right Wo Contrast  Result Date: 04/29/2023 CLINICAL DATA:  Fall yesterday with right hip pain, initial encounter EXAM: CT OF THE RIGHT HIP WITHOUT CONTRAST TECHNIQUE: Multidetector CT imaging of the right hip was performed according to the standard protocol. Multiplanar CT image reconstructions were also generated. RADIATION DOSE REDUCTION: This exam was performed according to the departmental dose-optimization program which includes automated exposure control, adjustment of the mA and/or kV according to patient size and/or use of iterative reconstruction technique. COMPARISON:  Plain film from earlier in the same day. FINDINGS: Bones/Joint/Cartilage Degenerative changes of the right sacroiliac joint and lower lumbar spine are noted. Prior cage fusion at L4-5 and L5-S1 is noted. Degenerative changes of the right hip are seen with subchondral sclerosis and cyst formation. No acute fracture or dislocation is noted. Mild osteopenia is seen. No joint effusion is noted. Ligaments Suboptimally assessed by CT. Muscles and Tendons Surrounding musculature appears within normal limits. Soft tissues Surrounding soft tissue structures show  no acute abnormality. IMPRESSION: Degenerative changes without acute fracture. No soft tissue abnormality is noted. Electronically Signed   By: Alcide Clever M.D.   On: 04/29/2023 21:05   DG Pelvis Portable  Result Date: 04/29/2023 CLINICAL DATA:  ? Hip fracture. EXAM: PORTABLE PELVIS 1 VIEWS COMPARISON:  None Available. FINDINGS: There is bilateral hip degenerative change with joint space narrowing and small osteophytes. Pelvic ring is intact. No osteolytic or osteoblastic lesions. Can not  exclude right acetabular fracture. Consider CT correlation. IMPRESSION: Bilateral hip degenerative changes. Possible right acetabular fracture. Consider CT correlation. Electronically Signed   By: Layla Maw M.D.   On: 04/29/2023 18:18   CT Chest Wo Contrast  Result Date: 04/29/2023 CLINICAL DATA:  Fall. EXAM: CT CHEST WITHOUT CONTRAST TECHNIQUE: Multidetector CT imaging of the chest was performed following the standard protocol without IV contrast. RADIATION DOSE REDUCTION: This exam was performed according to the departmental dose-optimization program which includes automated exposure control, adjustment of the mA and/or kV according to patient size and/or use of iterative reconstruction technique. COMPARISON:  CT chest dated Oct 26, 2017. FINDINGS: Cardiovascular: Thoracic aorta is normal in caliber. Atherosclerotic calcifications of the thoracic aorta and arch branch vessels. Multivessel coronary artery calcifications. Similar enlargement of the left atrium. No pericardial effusion. The main pulmonary trunk is dilated measuring up to 3.4 cm. Mediastinum/Nodes: No enlarged mediastinal or axillary lymph nodes. Large hiatal hernia with intrathoracic stomach again noted. The trachea is unremarkable. Lungs/Pleura: Biapical pleural-parenchymal scarring. Stable 6 mm nodule in the left lower lobe (series 5, image 116) and 4 mm nodule in the right upper lobe (series 5, image 75), essentially unchanged since 2017,  favoring a benign etiology. Calcified granulomas. Mild right basilar atelectasis. No pleural effusion. No pneumothorax. Upper Abdomen: No acute abnormality. Large hiatal hernia/intrathoracic stomach, unchanged. Musculoskeletal: Comminuted transverse fracture of the right proximal humerus at the level of the surgical neck with mild impaction and approximately 2.4 cm of medial displacement of the distal segment (series 6, image 70). The glenoid appears intact. No additional fracture identified. IMPRESSION: 1. No acute traumatic intrathoracic findings. 2. Comminuted, displaced, and impacted fracture of the right proximal humerus at the level of the surgical neck. No additional fracture identified. 3. Enlargement of the main pulmonary artery, which can be seen in the setting of pulmonary arterial hypertension. 4.  Aortic Atherosclerosis (ICD10-I70.0). 5. Additional unchanged ancillary findings, as described above. Electronically Signed   By: Hart Robinsons M.D.   On: 04/29/2023 15:05   DG Shoulder Right  Result Date: 04/29/2023 CLINICAL DATA:  Fall.  Right shoulder pain. EXAM: RIGHT SHOULDER - 2+ VIEW COMPARISON:  Right shoulder radiographs dated May 14, 2021. FINDINGS: Comminuted transverse fracture of the proximal humerus at the level of the surgical neck with mild impaction and approximately 2.4 cm of medial displacement of the distal segment. The humeral head appears aligned with the glenoid with prominent marginal osteophytes. The glenoid appears intact. Acromioclavicular joint is anatomically aligned. Soft tissue swelling of the right shoulder. IMPRESSION: Comminuted, displaced, and impacted fracture of the right proximal humerus at the level of the surgical neck. Electronically Signed   By: Hart Robinsons M.D.   On: 04/29/2023 14:44   CT Lumbar Spine Wo Contrast  Result Date: 04/29/2023 CLINICAL DATA:  Neck trauma. Mechanical fall. Patient fell yesterday 1 p.m. and has been on the floor since.  Was found down this morning. EXAM: CT LUMBAR SPINE WITHOUT CONTRAST TECHNIQUE: Multidetector CT imaging of the lumbar spine was performed without intravenous contrast administration. Multiplanar CT image reconstructions were also generated. RADIATION DOSE REDUCTION: This exam was performed according to the departmental dose-optimization program which includes automated exposure control, adjustment of the mA and/or kV according to patient size and/or use of iterative reconstruction technique. COMPARISON:  Lumbar spine radiographs 04/11/2022. FINDINGS: Segmentation: 5 non rib-bearing lumbar type vertebral bodies are present. The lowest fully formed vertebral body is L5. Alignment: Grade 1 anterolisthesis at L4-5 and L5-S1 is  stable. Vertebrae: Chronic endplate sclerotic changes are again noted at L2-3. No acute fractures are present. Endplate changes related to fusion at L4 and L5 are stable. Paraspinal and other soft tissues: Atherosclerotic calcifications are present in the aorta without aneurysm. Cholecystectomy is noted. No solid organ lesions are present. No significant adenopathy is present. Disc levels: L1-2: Broad-based disc protrusion is present. Mild facet hypertrophy is present bilaterally. L2-3: Broad-based disc protrusion is asymmetric to the right. Mild subarticular narrowing is present bilaterally. Moderate right and mild left foraminal stenosis is present. L3-4: A broad-based disc protrusion is present. Facet hypertrophy is worse on the right. Mild subarticular narrowing is present bilaterally. Moderate right and mild left foraminal stenosis is present. L4-5: Fusion and laminectomy is present. No significant central canal stenosis is present. Severe left and mild right osseous foraminal stenosis is present. L5-S1: Laminectomies and fusion are noted. No residual or recurrent stenosis is present. IMPRESSION: 1. No acute fracture or traumatic subluxation. 2. Stable fusion and laminectomy at L4-5 and  L5-S1. 3. Severe left and mild right osseous foraminal stenosis at L4-5. 4. Moderate right and mild left foraminal stenosis at L2-3 and L3-4. 5. Mild subarticular narrowing bilaterally at L2-3 and L3-4. 6.  Aortic Atherosclerosis (ICD10-I70.0). Electronically Signed   By: Marin Roberts M.D.   On: 04/29/2023 13:09   CT Cervical Spine Wo Contrast  Result Date: 04/29/2023 CLINICAL DATA:  Mechanical fall yesterday. Patient was down overnight and found this morning. EXAM: CT CERVICAL SPINE WITHOUT CONTRAST TECHNIQUE: Multidetector CT imaging of the cervical spine was performed without intravenous contrast. Multiplanar CT image reconstructions were also generated. RADIATION DOSE REDUCTION: This exam was performed according to the departmental dose-optimization program which includes automated exposure control, adjustment of the mA and/or kV according to patient size and/or use of iterative reconstruction technique. COMPARISON:  MRI of the cervical spine 01/14/2021. Cervical spine radiographs 04/08/2022. FINDINGS: Alignment: Grade 1 anterolisthesis in C3-4 stable. No other significant listhesis is present. Straightening of the normal cervical lordosis is otherwise stable. Skull base and vertebrae: The craniocervical junction is normal. Vertebral body heights are normal. No acute fractures are present. Soft tissues and spinal canal: No prevertebral fluid or swelling. No visible canal hematoma. Atherosclerotic calcifications are present at the aortic arch and carotid bifurcations bilaterally without definite stenosis. No aneurysm or dissection is evident. Disc levels: Multilevel degenerative disc disease is present. Stenosis is again greatest at the left foramina of C5-6 and C6-7. Upper chest: The lung apices are clear. The thoracic inlet is within normal limits. No pneumothorax or significant pleural effusion is present. IMPRESSION: 1. No acute fracture or traumatic subluxation. 2. Multilevel degenerative disc  disease. 3. Stenosis is again greatest at the left foramina of C5-6 and C6-7. 4.  Aortic Atherosclerosis (ICD10-I70.0). Electronically Signed   By: Marin Roberts M.D.   On: 04/29/2023 12:07   CT Head Wo Contrast  Result Date: 04/29/2023 CLINICAL DATA:  Head trauma. Mechanical fall yesterday afternoon. Patient has been down since the fall. EXAM: CT HEAD WITHOUT CONTRAST TECHNIQUE: Contiguous axial images were obtained from the base of the skull through the vertex without intravenous contrast. RADIATION DOSE REDUCTION: This exam was performed according to the departmental dose-optimization program which includes automated exposure control, adjustment of the mA and/or kV according to patient size and/or use of iterative reconstruction technique. COMPARISON:  MR head without contrast 01/16/2021. CT head without contrast 01/15/2021. FINDINGS: Brain: No acute infarct, hemorrhage, or mass lesion is present. Advanced atrophy and white matter  disease is similar the prior exams. The ventricles are proportionate to the degree of atrophy. No significant extraaxial fluid collection is present. Insert normal brainstem The brainstem and cerebellum are within normal limits. Midline structures are within normal limits. Vascular: Atherosclerotic calcifications are present within the cavernous internal carotid arteries bilaterally. No hyperdense vessel is present. Skull: Calvarium is intact. No focal lytic or blastic lesions are present. No significant extracranial soft tissue lesion is present. Sinuses/Orbits: The paranasal sinuses and mastoid air cells are clear. The globes and orbits are within normal limits. IMPRESSION: 1. No acute intracranial abnormality or significant interval change. 2. Advanced atrophy and white matter disease is similar the prior exams. This likely reflects the sequela of chronic microvascular ischemia. Electronically Signed   By: Marin Roberts M.D.   On: 04/29/2023 11:56     Assessment/Plan Present on Admission:  Fall, initial encounter -Secondary to arthritis.   -PT/OT consult.   Atrial fib w/ RVR -Home dose of metoprolol PO ordered by EDP.   -Tachycardia responsive to as needed Lopressor.  IV Lopressor ordered PRN -Pharmacy consulted for coumadin managemant   Right Humerus fracture //  Arthritis, multiple joint involvement -Orthopedics Dr Leotis Shames contacted by EDP.  Arm splinted.  Outpatient follow-up planned. -Scheduled Tylenol. PRN roxicodone and Dilaudid IV -PT/OT consult placed   Mild rhabdomyolysis -Gentle IV fluid hydration, given patient's age and A-fib with RVR. -Follow-up CK levels in a.m.   Essential hypertension, benign -Metoprolol resumed   Tremor -Levodopa carbidopa resumed   Hypothyroidism -Synthroid resumed   Charlotte Castro 04/29/2023, 11:39 PM

## 2023-04-29 NOTE — Progress Notes (Signed)
PHARMACY - ANTICOAGULATION CONSULT NOTE  Pharmacy Consult for Coumadin Indication: atrial fibrillation  Allergies  Allergen Reactions   Iodinated Contrast Media Shortness Of Breath and Other (See Comments)    "Allergic," per [facility]- Causes headaches, also   Zosyn [Piperacillin Sod-Tazobactam So] Other (See Comments)    "Allergic," per [facility]   Penicillins Other (See Comments)    Tolerated Zosyn Oct 2018, but "Allergic," per [facility] Did it involve swelling of the face/tongue/throat, SOB, or low BP? Unk Did it involve sudden or severe rash/hives, skin peeling, or any reaction on the inside of your mouth or nose? Unk Did you need to seek medical attention at a hospital or doctor's office? Unk When did it last happen? Unk If all above answers are "NO", may proceed with cephalosporin use.     Patient Measurements:    Vital Signs: Temp: 98.1 F (36.7 C) (11/15 1828) Temp Source: Oral (11/15 1233) BP: 110/87 (11/15 2330) Pulse Rate: 107 (11/15 2330)  Labs: Recent Labs    04/29/23 1129  HGB 15.0  HCT 42.0  PLT 158  LABPROT 20.2*  INR 1.7*  CREATININE 1.14*  CKTOTAL 707*    CrCl cannot be calculated (Unknown ideal weight.).   Medical History: Past Medical History:  Diagnosis Date   Anemia    history of   Anxiety    Arthritis    "left knee" (04/05/2017)   Atrial fibrillation (HCC)    Atrial flutter (HCC)    typical appearing   Atrial tachycardia (HCC)    ablated 11/17/10  by JA  from the [facility] of the aorta   CHF (congestive heart failure) (HCC)    Chronic lower back pain    Chronic nausea    Endometrial cancer (HCC)    grade 1   Gallstone pancreatitis    Gastritis    GERD (gastroesophageal reflux disease)    History of hiatal hernia    HTN (hypertension)    Hyperlipemia    Hypothyroidism    Obesity    Osteoporosis    Toe ulcer (HCC)    left 3rd toe   Vitamin D deficiency     Medications:  No current facility-administered medications on file prior to encounter.    Current Outpatient Medications on File Prior to Encounter  Medication Sig Dispense Refill   acetaminophen (TYLENOL) 650 MG CR tablet Take 650 mg by mouth daily.     carbidopa-levodopa (SINEMET IR) 25-100 MG tablet Take 1 tablet by mouth 3 (three) times daily. 8am/noon/4pm 270 tablet 1   levothyroxine (SYNTHROID) 88 MCG tablet TAKE ONE TABLET BY MOUTH EVERY DAY BEFORE BREAKFAST 90 tablet 0   metoprolol tartrate (LOPRESSOR) 50 MG tablet TAKE ONE TABLET TWICE DAILY WITH A MEAL 180 tablet 2   vitamin B-12 (CYANOCOBALAMIN) 1000 MCG tablet Take 1,000 mcg by mouth daily.     warfarin (COUMADIN) 5 MG tablet TAKE ONE-HALF TO ONE TABLET DAILY AS DIRECTED BY COUMADIN CLINIC (Patient taking differently: Take 5 mg by mouth at bedtime. Take 1 tablet Mon, Wed, and Fri. Take one-half tablet Sun, Tues, Thurs, and Sat. As DIRECTED BY COUMADIN CLINIC) 30 tablet 3   gabapentin (NEURONTIN) 100 MG capsule TAKE ONE CAPSULE BY MOUTH TWICE DAILY 60 capsule 2   lidocaine (LIDODERM) 5 % Place 1 patch onto the skin daily. Remove & Discard patch within 12 hours or as directed by MD 30 patch 0   metoprolol tartrate (LOPRESSOR) 50 MG tablet TAKE ONE TABLET TWICE DAILY WITH A MEAL 180 tablet  2   [DISCONTINUED] cholecalciferol (VITAMIN D3) 25 MCG (1000 UNIT) tablet Take 1,000 Units by mouth daily. (Patient not taking: Reported on 04/29/2023)     [DISCONTINUED] ondansetron (ZOFRAN-ODT) 8 MG disintegrating tablet 8mg  ODT q8 hours prn nausea (Patient not taking: Reported on 04/29/2023) 10 tablet 0   [DISCONTINUED] pantoprazole (PROTONIX) 20 MG tablet Take 1 tablet (20 mg total) by mouth 2 (two) times daily before a meal. (Patient not taking: Reported on 04/04/2023)  0     Assessment: 89 y.o. female admitted s/p fall, h/o Afib, to continue Coumadin.  Current home regimen Coumadin 2.5 mg MWF and 5 mg TTSS  Goal of Therapy:  INR 2-3 Monitor platelets by anticoagulation protocol: Yes   Plan:  Coumadin 5 mg tonight F/U daily  INR  Lexee Brashears, Gary Fleet 04/29/2023,11:38 PM

## 2023-04-30 DIAGNOSIS — I482 Chronic atrial fibrillation, unspecified: Secondary | ICD-10-CM | POA: Diagnosis not present

## 2023-04-30 DIAGNOSIS — Z9189 Other specified personal risk factors, not elsewhere classified: Secondary | ICD-10-CM | POA: Insufficient documentation

## 2023-04-30 DIAGNOSIS — Y92009 Unspecified place in unspecified non-institutional (private) residence as the place of occurrence of the external cause: Secondary | ICD-10-CM

## 2023-04-30 DIAGNOSIS — W19XXXA Unspecified fall, initial encounter: Secondary | ICD-10-CM | POA: Diagnosis not present

## 2023-04-30 LAB — BASIC METABOLIC PANEL
Anion gap: 9 (ref 5–15)
BUN: 27 mg/dL — ABNORMAL HIGH (ref 8–23)
CO2: 22 mmol/L (ref 22–32)
Calcium: 9 mg/dL (ref 8.9–10.3)
Chloride: 110 mmol/L (ref 98–111)
Creatinine, Ser: 0.97 mg/dL (ref 0.44–1.00)
GFR, Estimated: 56 mL/min — ABNORMAL LOW (ref >60)
Glucose, Bld: 84 mg/dL (ref 70–99)
Potassium: 3.8 mmol/L (ref 3.5–5.1)
Sodium: 141 mmol/L (ref 135–145)

## 2023-04-30 LAB — CK: Total CK: 304 U/L — ABNORMAL HIGH (ref 38–234)

## 2023-04-30 LAB — CBC
HCT: 39.2 % (ref 36.0–46.0)
Hemoglobin: 13.4 g/dL (ref 12.0–15.0)
MCH: 32.4 pg (ref 26.0–34.0)
MCHC: 34.2 g/dL (ref 30.0–36.0)
MCV: 94.7 fL (ref 80.0–100.0)
Platelets: 152 10*3/uL (ref 150–400)
RBC: 4.14 MIL/uL (ref 3.87–5.11)
RDW: 13.3 % (ref 11.5–15.5)
WBC: 14.8 10*3/uL — ABNORMAL HIGH (ref 4.0–10.5)
nRBC: 0 % (ref 0.0–0.2)

## 2023-04-30 LAB — PROTIME-INR
INR: 1.9 — ABNORMAL HIGH (ref 0.8–1.2)
Prothrombin Time: 22 s — ABNORMAL HIGH (ref 11.4–15.2)

## 2023-04-30 LAB — MAGNESIUM: Magnesium: 1.8 mg/dL (ref 1.7–2.4)

## 2023-04-30 MED ORDER — SENNOSIDES-DOCUSATE SODIUM 8.6-50 MG PO TABS: 1.0000 | ORAL_TABLET | Freq: Every evening | ORAL | Status: DC | PRN

## 2023-04-30 MED ORDER — ACETAMINOPHEN 650 MG RE SUPP: 650.0000 mg | Freq: Four times a day (QID) | RECTAL | Status: DC | PRN

## 2023-04-30 MED ORDER — CARBIDOPA-LEVODOPA 25-100 MG PO TABS
1.0000 | ORAL_TABLET | Freq: Three times a day (TID) | ORAL | Status: DC
  Administered 2023-04-30 – 2023-05-02 (×8): 1 via ORAL
  Filled 2023-04-30 (×8): qty 1

## 2023-04-30 MED ORDER — WARFARIN SODIUM 5 MG PO TABS
5.0000 mg | ORAL_TABLET | Freq: Once | ORAL | Status: AC
  Administered 2023-04-30: 5 mg via ORAL
  Filled 2023-04-30: qty 1

## 2023-04-30 MED ORDER — ACETAMINOPHEN 325 MG PO TABS: 650.0000 mg | ORAL_TABLET | Freq: Four times a day (QID) | ORAL | Status: DC | PRN

## 2023-04-30 MED ORDER — LEVOTHYROXINE SODIUM 88 MCG PO TABS
88.0000 ug | ORAL_TABLET | Freq: Every day | ORAL | Status: DC
  Administered 2023-04-30 – 2023-05-02 (×3): 88 ug via ORAL
  Filled 2023-04-30 (×3): qty 1

## 2023-04-30 MED ORDER — LACTATED RINGERS IV SOLN: INTRAVENOUS | Status: DC

## 2023-04-30 NOTE — Progress Notes (Addendum)
PROGRESS NOTE    Charlotte Castro  YQI:[id number] DOB: 1932/11/26 DOA: 04/29/2023 PCP: Philip Aspen, Limmie Patricia, MD   Brief Narrative:  89 year old female with past medical history of HTN, arthritis, A-fib, chronic diastolic heart failure, hypothyroidism, tremors with possible recent diagnosis of parkinsonism for which she was started on carbidopa levodopa by neurology as an outpatient and recurrent UTIs presented with a fall and was found to have comminuted fracture of right proximal humerus.  ED provider contacted orthopedics who recommended outpatient follow-up with orthopedics.  Patient also had A-fib with RVR.  Assessment & Plan:   Acute comminuted fracture of right proximal humerus following a fall -ED provider contacted orthopedics who recommended outpatient follow-up with orthopedics.  Right upper extremity splint placed in the ED. -Fall precautions.  Pain management. -PT/OT eval  Permanent A-fib with RVR -Still has intermittent mild tachycardia.  Continue metoprolol.  Continue Coumadin.  Coumadin to be dosed as per pharmacy based on INR.  INR 1.9 today  Leukocytosis -Most likely reactive.  Improving.  Monitor  Elevated CK -CK elevated only minimally at 707 on presentation.  Treated with IV fluids.  Encourage oral intake.  Stop IV fluids.  Essential hypertension -Continue metoprolol  History of tremors with?  New diagnosis of parkinsonism -Continue levodopa carbidopa.  Outpatient follow-up with neurology.  Hypothyroidism -Continue levothyroxine   DVT prophylaxis: Coumadin Code Status: Full Family Communication: Daughter on phone Disposition Plan: Status is: Observation The patient will require care spanning > 2 midnights and should be moved to inpatient because: Severity of illness.  Need for PT evaluation and possible placement    Consultants: None  Procedures: None  Antimicrobials: None   Subjective: Patient seen and examined at bedside.  Still complains  of left upper extremity and shoulder pain.  Denies worsening abdominal pain, vomiting, chest pain.  Feels weak.  Objective: Vitals:   04/30/23 0330 04/30/23 0415 04/30/23 0528 04/30/23 0814  BP: (!) 140/74 (!) 120/98 (!) 148/82 121/62  Pulse: 98 (!) 101 100 (!) 101  Resp: 12 20 18    Temp:   98 F (36.7 C) 97.8 F (36.6 C)  TempSrc:   Oral Oral  SpO2: 99% 99% 100% 98%  Weight:   88.5 kg   Height:   5\' 9"  (1.753 m)     Intake/Output Summary (Last 24 hours) at 04/30/2023 1100 Last data filed at 04/30/2023 0619 Gross per 24 hour  Intake 120 ml  Output --  Net 120 ml   Filed Weights   04/30/23 0528  Weight: 88.5 kg    Examination:  General exam: Appears calm and comfortable.  On room air.  Elderly female lying in bed. Respiratory system: Bilateral decreased breath sounds at bases with scattered crackles Cardiovascular system: S1 & S2 heard, intermittently tachycardic gastrointestinal system: Abdomen is nondistended, soft and nontender. Normal bowel sounds heard. Extremities: No cyanosis, clubbing, edema.  Right shoulder swelling tenderness with bruising.  Right arm splint present Central nervous system: Awake.  Slow to respond.  Poor historian.  No focal neurological deficits. Moving extremities Skin: No rashes, lesions or ulcers Psychiatry: Flat affect.  Not agitated.   Data Reviewed: I have personally reviewed following labs and imaging studies  CBC: Recent Labs  Lab 04/29/23 1129 04/30/23 0439  WBC 17.3* 14.8*  NEUTROABS 15.0*  --   HGB 15.0 13.4  HCT 42.0 39.2  MCV 92.3 94.7  PLT 158 152   Basic Metabolic Panel: Recent Labs  Lab 04/29/23 1129 04/30/23 0439  NA 140  141  K 4.0 3.8  CL 107 110  CO2 21* 22  GLUCOSE 133* 84  BUN 25* 27*  CREATININE 1.14* 0.97  CALCIUM 9.4 9.0  MG  --  1.8   GFR: Estimated Creatinine Clearance: 47.5 mL/min (by C-G formula based on SCr of 0.97 mg/dL). Liver Function Tests: Recent Labs  Lab 04/29/23 1129  AST 39  ALT  22  ALKPHOS 67  BILITOT 2.6*  PROT 6.5  ALBUMIN 3.6   No results for input(s): "LIPASE", "AMYLASE" in the last 168 hours. No results for input(s): "AMMONIA" in the last 168 hours. Coagulation Profile: Recent Labs  Lab 04/29/23 1129 04/30/23 0439  INR 1.7* 1.9*   Cardiac Enzymes: Recent Labs  Lab 04/29/23 1129  CKTOTAL 707*   BNP (last 3 results) No results for input(s): "PROBNP" in the last 8760 hours. HbA1C: No results for input(s): "HGBA1C" in the last 72 hours. CBG: No results for input(s): "GLUCAP" in the last 168 hours. Lipid Profile: No results for input(s): "CHOL", "HDL", "LDLCALC", "TRIG", "CHOLHDL", "LDLDIRECT" in the last 72 hours. Thyroid Function Tests: No results for input(s): "TSH", "T4TOTAL", "FREET4", "T3FREE", "THYROIDAB" in the last 72 hours. Anemia Panel: No results for input(s): "VITAMINB12", "FOLATE", "FERRITIN", "TIBC", "IRON", "RETICCTPCT" in the last 72 hours. Sepsis Labs: No results for input(s): "PROCALCITON", "LATICACIDVEN" in the last 168 hours.  No results found for this or any previous visit (from the past 240 hour(s)).       Radiology Studies: DG Ankle Complete Right  Result Date: 04/29/2023 CLINICAL DATA:  Fall yesterday with right ankle pain, initial encounter EXAM: RIGHT ANKLE - COMPLETE 3+ VIEW COMPARISON:  None Available. FINDINGS: There is no evidence of fracture, dislocation, or joint effusion. There is no evidence of arthropathy or other focal bone abnormality. Soft tissues are unremarkable. IMPRESSION: No acute abnormality noted. Electronically Signed   By: Alcide Clever M.D.   On: 04/29/2023 21:13   DG Femur Min 2 Views Right  Result Date: 04/29/2023 CLINICAL DATA:  Fall yesterday with persistent right leg pain, initial encounter EXAM: RIGHT FEMUR 2 VIEWS COMPARISON:  None Available. FINDINGS: Degenerative changes of the right hip joint are noted. No acute fracture or dislocation is seen. Degenerative changes about the knee  joint are noted as well. No soft tissue abnormality is seen. IMPRESSION: Degenerative change without acute abnormality. Electronically Signed   By: Alcide Clever M.D.   On: 04/29/2023 21:07   DG Tibia/Fibula Right  Result Date: 04/29/2023 CLINICAL DATA:  Fall yesterday with right lower leg pain, initial encounter EXAM: RIGHT TIBIA AND FIBULA - 2 VIEW COMPARISON:  None Available. FINDINGS: There is no evidence of fracture or other focal bone lesions. Soft tissues are unremarkable. IMPRESSION: No acute abnormality noted. Electronically Signed   By: Alcide Clever M.D.   On: 04/29/2023 21:07   DG Knee 2 Views Right  Result Date: 04/29/2023 CLINICAL DATA:  Recent fall yesterday with right knee pain, initial encounter EXAM: RIGHT KNEE - 2 VIEW COMPARISON:  None Available. FINDINGS: Tricompartmental degenerative changes are noted. No joint effusion is seen. No acute fracture or dislocation is noted. IMPRESSION: Degenerative change without acute abnormality. Electronically Signed   By: Alcide Clever M.D.   On: 04/29/2023 21:06   CT Hip Right Wo Contrast  Result Date: 04/29/2023 CLINICAL DATA:  Fall yesterday with right hip pain, initial encounter EXAM: CT OF THE RIGHT HIP WITHOUT CONTRAST TECHNIQUE: Multidetector CT imaging of the right hip was performed according to the standard  protocol. Multiplanar CT image reconstructions were also generated. RADIATION DOSE REDUCTION: This exam was performed according to the departmental dose-optimization program which includes automated exposure control, adjustment of the mA and/or kV according to patient size and/or use of iterative reconstruction technique. COMPARISON:  Plain film from earlier in the same day. FINDINGS: Bones/Joint/Cartilage Degenerative changes of the right sacroiliac joint and lower lumbar spine are noted. Prior cage fusion at L4-5 and L5-S1 is noted. Degenerative changes of the right hip are seen with subchondral sclerosis and cyst formation. No acute  fracture or dislocation is noted. Mild osteopenia is seen. No joint effusion is noted. Ligaments Suboptimally assessed by CT. Muscles and Tendons Surrounding musculature appears within normal limits. Soft tissues Surrounding soft tissue structures show no acute abnormality. IMPRESSION: Degenerative changes without acute fracture. No soft tissue abnormality is noted. Electronically Signed   By: Alcide Clever M.D.   On: 04/29/2023 21:05   DG Pelvis Portable  Result Date: 04/29/2023 CLINICAL DATA:  ? Hip fracture. EXAM: PORTABLE PELVIS 1 VIEWS COMPARISON:  None Available. FINDINGS: There is bilateral hip degenerative change with joint space narrowing and small osteophytes. Pelvic ring is intact. No osteolytic or osteoblastic lesions. Can not exclude right acetabular fracture. Consider CT correlation. IMPRESSION: Bilateral hip degenerative changes. Possible right acetabular fracture. Consider CT correlation. Electronically Signed   By: Layla Maw M.D.   On: 04/29/2023 18:18   CT Chest Wo Contrast  Result Date: 04/29/2023 CLINICAL DATA:  Fall. EXAM: CT CHEST WITHOUT CONTRAST TECHNIQUE: Multidetector CT imaging of the chest was performed following the standard protocol without IV contrast. RADIATION DOSE REDUCTION: This exam was performed according to the departmental dose-optimization program which includes automated exposure control, adjustment of the mA and/or kV according to patient size and/or use of iterative reconstruction technique. COMPARISON:  CT chest dated Oct 26, 2017. FINDINGS: Cardiovascular: Thoracic aorta is normal in caliber. Atherosclerotic calcifications of the thoracic aorta and arch branch vessels. Multivessel coronary artery calcifications. Similar enlargement of the left atrium. No pericardial effusion. The main pulmonary trunk is dilated measuring up to 3.4 cm. Mediastinum/Nodes: No enlarged mediastinal or axillary lymph nodes. Large hiatal hernia with intrathoracic stomach again  noted. The trachea is unremarkable. Lungs/Pleura: Biapical pleural-parenchymal scarring. Stable 6 mm nodule in the left lower lobe (series 5, image 116) and 4 mm nodule in the right upper lobe (series 5, image 75), essentially unchanged since 2017, favoring a benign etiology. Calcified granulomas. Mild right basilar atelectasis. No pleural effusion. No pneumothorax. Upper Abdomen: No acute abnormality. Large hiatal hernia/intrathoracic stomach, unchanged. Musculoskeletal: Comminuted transverse fracture of the right proximal humerus at the level of the surgical neck with mild impaction and approximately 2.4 cm of medial displacement of the distal segment (series 6, image 70). The glenoid appears intact. No additional fracture identified. IMPRESSION: 1. No acute traumatic intrathoracic findings. 2. Comminuted, displaced, and impacted fracture of the right proximal humerus at the level of the surgical neck. No additional fracture identified. 3. Enlargement of the main pulmonary artery, which can be seen in the setting of pulmonary arterial hypertension. 4.  Aortic Atherosclerosis (ICD10-I70.0). 5. Additional unchanged ancillary findings, as described above. Electronically Signed   By: Hart Robinsons M.D.   On: 04/29/2023 15:05   DG Shoulder Right  Result Date: 04/29/2023 CLINICAL DATA:  Fall.  Right shoulder pain. EXAM: RIGHT SHOULDER - 2+ VIEW COMPARISON:  Right shoulder radiographs dated May 14, 2021. FINDINGS: Comminuted transverse fracture of the proximal humerus at the level of the surgical  neck with mild impaction and approximately 2.4 cm of medial displacement of the distal segment. The humeral head appears aligned with the glenoid with prominent marginal osteophytes. The glenoid appears intact. Acromioclavicular joint is anatomically aligned. Soft tissue swelling of the right shoulder. IMPRESSION: Comminuted, displaced, and impacted fracture of the right proximal humerus at the level of the surgical  neck. Electronically Signed   By: Hart Robinsons M.D.   On: 04/29/2023 14:44   CT Lumbar Spine Wo Contrast  Result Date: 04/29/2023 CLINICAL DATA:  Neck trauma. Mechanical fall. Patient fell yesterday 1 p.m. and has been on the floor since. Was found down this morning. EXAM: CT LUMBAR SPINE WITHOUT CONTRAST TECHNIQUE: Multidetector CT imaging of the lumbar spine was performed without intravenous contrast administration. Multiplanar CT image reconstructions were also generated. RADIATION DOSE REDUCTION: This exam was performed according to the departmental dose-optimization program which includes automated exposure control, adjustment of the mA and/or kV according to patient size and/or use of iterative reconstruction technique. COMPARISON:  Lumbar spine radiographs 04/11/2022. FINDINGS: Segmentation: 5 non rib-bearing lumbar type vertebral bodies are present. The lowest fully formed vertebral body is L5. Alignment: Grade 1 anterolisthesis at L4-5 and L5-S1 is stable. Vertebrae: Chronic endplate sclerotic changes are again noted at L2-3. No acute fractures are present. Endplate changes related to fusion at L4 and L5 are stable. Paraspinal and other soft tissues: Atherosclerotic calcifications are present in the aorta without aneurysm. Cholecystectomy is noted. No solid organ lesions are present. No significant adenopathy is present. Disc levels: L1-2: Broad-based disc protrusion is present. Mild facet hypertrophy is present bilaterally. L2-3: Broad-based disc protrusion is asymmetric to the right. Mild subarticular narrowing is present bilaterally. Moderate right and mild left foraminal stenosis is present. L3-4: A broad-based disc protrusion is present. Facet hypertrophy is worse on the right. Mild subarticular narrowing is present bilaterally. Moderate right and mild left foraminal stenosis is present. L4-5: Fusion and laminectomy is present. No significant central canal stenosis is present. Severe left and  mild right osseous foraminal stenosis is present. L5-S1: Laminectomies and fusion are noted. No residual or recurrent stenosis is present. IMPRESSION: 1. No acute fracture or traumatic subluxation. 2. Stable fusion and laminectomy at L4-5 and L5-S1. 3. Severe left and mild right osseous foraminal stenosis at L4-5. 4. Moderate right and mild left foraminal stenosis at L2-3 and L3-4. 5. Mild subarticular narrowing bilaterally at L2-3 and L3-4. 6.  Aortic Atherosclerosis (ICD10-I70.0). Electronically Signed   By: Marin Roberts M.D.   On: 04/29/2023 13:09   CT Cervical Spine Wo Contrast  Result Date: 04/29/2023 CLINICAL DATA:  Mechanical fall yesterday. Patient was down overnight and found this morning. EXAM: CT CERVICAL SPINE WITHOUT CONTRAST TECHNIQUE: Multidetector CT imaging of the cervical spine was performed without intravenous contrast. Multiplanar CT image reconstructions were also generated. RADIATION DOSE REDUCTION: This exam was performed according to the departmental dose-optimization program which includes automated exposure control, adjustment of the mA and/or kV according to patient size and/or use of iterative reconstruction technique. COMPARISON:  MRI of the cervical spine 01/14/2021. Cervical spine radiographs 04/08/2022. FINDINGS: Alignment: Grade 1 anterolisthesis in C3-4 stable. No other significant listhesis is present. Straightening of the normal cervical lordosis is otherwise stable. Skull base and vertebrae: The craniocervical junction is normal. Vertebral body heights are normal. No acute fractures are present. Soft tissues and spinal canal: No prevertebral fluid or swelling. No visible canal hematoma. Atherosclerotic calcifications are present at the aortic arch and carotid bifurcations bilaterally without definite  stenosis. No aneurysm or dissection is evident. Disc levels: Multilevel degenerative disc disease is present. Stenosis is again greatest at the left foramina of C5-6 and  C6-7. Upper chest: The lung apices are clear. The thoracic inlet is within normal limits. No pneumothorax or significant pleural effusion is present. IMPRESSION: 1. No acute fracture or traumatic subluxation. 2. Multilevel degenerative disc disease. 3. Stenosis is again greatest at the left foramina of C5-6 and C6-7. 4.  Aortic Atherosclerosis (ICD10-I70.0). Electronically Signed   By: Marin Roberts M.D.   On: 04/29/2023 12:07   CT Head Wo Contrast  Result Date: 04/29/2023 CLINICAL DATA:  Head trauma. Mechanical fall yesterday afternoon. Patient has been down since the fall. EXAM: CT HEAD WITHOUT CONTRAST TECHNIQUE: Contiguous axial images were obtained from the base of the skull through the vertex without intravenous contrast. RADIATION DOSE REDUCTION: This exam was performed according to the departmental dose-optimization program which includes automated exposure control, adjustment of the mA and/or kV according to patient size and/or use of iterative reconstruction technique. COMPARISON:  MR head without contrast 01/16/2021. CT head without contrast 01/15/2021. FINDINGS: Brain: No acute infarct, hemorrhage, or mass lesion is present. Advanced atrophy and white matter disease is similar the prior exams. The ventricles are proportionate to the degree of atrophy. No significant extraaxial fluid collection is present. Insert normal brainstem The brainstem and cerebellum are within normal limits. Midline structures are within normal limits. Vascular: Atherosclerotic calcifications are present within the cavernous internal carotid arteries bilaterally. No hyperdense vessel is present. Skull: Calvarium is intact. No focal lytic or blastic lesions are present. No significant extracranial soft tissue lesion is present. Sinuses/Orbits: The paranasal sinuses and mastoid air cells are clear. The globes and orbits are within normal limits. IMPRESSION: 1. No acute intracranial abnormality or significant interval  change. 2. Advanced atrophy and white matter disease is similar the prior exams. This likely reflects the sequela of chronic microvascular ischemia. Electronically Signed   By: Marin Roberts M.D.   On: 04/29/2023 11:56        Scheduled Meds:  acetaminophen  650 mg Oral Q6H   carbidopa-levodopa  1 tablet Oral TID   levothyroxine  88 mcg Oral Q0600   metoprolol tartrate  50 mg Oral BID   warfarin  5 mg Oral ONCE-1600   Warfarin - Pharmacist Dosing Inpatient   Does not apply q1600   Continuous Infusions:  lactated ringers Stopped (04/30/23 0836)          Glade Lloyd, MD [facility] 04/30/2023, 11:00 AM

## 2023-04-30 NOTE — Progress Notes (Addendum)
PHARMACY - ANTICOAGULATION CONSULT NOTE  Pharmacy Consult for Coumadin Indication: atrial fibrillation  Allergies  Allergen Reactions   Iodinated Contrast Media Shortness Of Breath and Other (See Comments)    "Allergic," per [facility]- Causes headaches, also   Zosyn [Piperacillin Sod-Tazobactam So] Other (See Comments)    "Allergic," per [facility]   Penicillins Other (See Comments)    Tolerated Zosyn Oct 2018, but "Allergic," per [facility] Did it involve swelling of the face/tongue/throat, SOB, or low BP? Unk Did it involve sudden or severe rash/hives, skin peeling, or any reaction on the inside of your mouth or nose? Unk Did you need to seek medical attention at a hospital or doctor's office? Unk When did it last happen? Unk If all above answers are "NO", may proceed with cephalosporin use.     Patient Measurements: Height: 5\' 9"  (175.3 cm) Weight: 88.5 kg (195 lb 1.7 oz) IBW/kg (Calculated) : 66.2  Vital Signs: Temp: 97.8 F (36.6 C) (11/16 0814) Temp Source: Oral (11/16 0814) BP: 121/62 (11/16 0814) Pulse Rate: 101 (11/16 0814)  Labs: Recent Labs    04/29/23 1129 04/30/23 0439  HGB 15.0 13.4  HCT 42.0 39.2  PLT 158 152  LABPROT 20.2* 22.0*  INR 1.7* 1.9*  CREATININE 1.14* 0.97  CKTOTAL 707*  --     Estimated Creatinine Clearance: 47.5 mL/min (by C-G formula based on SCr of 0.97 mg/dL).   Medical History: Past Medical History:  Diagnosis Date   Anemia    history of   Anxiety    Arthritis    "left knee" (04/05/2017)   Atrial fibrillation (HCC)    Atrial flutter (HCC)    typical appearing   Atrial tachycardia (HCC)    ablated 11/17/10  by JA  from the [facility] of the aorta   CHF (congestive heart failure) (HCC)    Chronic lower back pain    Chronic nausea    Endometrial cancer (HCC)    grade 1   Gallstone pancreatitis    Gastritis    GERD (gastroesophageal reflux disease)    History of hiatal hernia    HTN (hypertension)    Hyperlipemia    Hypothyroidism    Obesity     Osteoporosis    Toe ulcer (HCC)    left 3rd toe   Vitamin D deficiency     Medications:  No current facility-administered medications on file prior to encounter.   Current Outpatient Medications on File Prior to Encounter  Medication Sig Dispense Refill   acetaminophen (TYLENOL) 650 MG CR tablet Take 650 mg by mouth daily.     carbidopa-levodopa (SINEMET IR) 25-100 MG tablet Take 1 tablet by mouth 3 (three) times daily. 8am/noon/4pm 270 tablet 1   levothyroxine (SYNTHROID) 88 MCG tablet TAKE ONE TABLET BY MOUTH EVERY DAY BEFORE BREAKFAST 90 tablet 0   metoprolol tartrate (LOPRESSOR) 50 MG tablet TAKE ONE TABLET TWICE DAILY WITH A MEAL 180 tablet 2   vitamin B-12 (CYANOCOBALAMIN) 1000 MCG tablet Take 1,000 mcg by mouth daily.     warfarin (COUMADIN) 5 MG tablet TAKE ONE-HALF TO ONE TABLET DAILY AS DIRECTED BY COUMADIN CLINIC (Patient taking differently: Take 5 mg by mouth at bedtime. Take 1 tablet Mon, Wed, and Fri. Take one-half tablet Sun, Tues, Thurs, and Sat. As DIRECTED BY COUMADIN CLINIC) 30 tablet 3     Assessment: 89 y.o. female admitted s/p fall, h/o Afib, to continue Coumadin.  Current home regimen Coumadin 2.5 mg MWF and 5 mg TTSS  INR today  1.9, just subtherapeutic, but up from 1.7 yesterday. Will resume home regimen, noting boosted dose (5 mg rather than 2.5 mg) given yesterday.  Goal of Therapy:  INR 2-3 Monitor platelets by anticoagulation protocol: Yes   Plan:  Coumadin 5 mg today F/U daily INR  Lora Paula, PharmD PGY-2 Infectious Diseases Pharmacy Resident [facility] 04/30/2023 9:47 AM

## 2023-04-30 NOTE — Progress Notes (Signed)
Patient lost IV access due to IV line getting caught on bed rail.  Patient only receiving IV fluids.  Alekh MD Aware and stated that this patient does not need IV access at this time if they are eating and drinking adequately

## 2023-04-30 NOTE — Evaluation (Addendum)
Occupational Therapy Evaluation Patient Details Name: Charlotte Castro MRN: [id number] DOB: 06-20-1932 Today's Date: 04/30/2023   History of Present Illness Pt is a 89 y.o. F who presents after a fall and was found on the floor after approximately 15 hours. Comminuted, displaced, and impacted fracture of the right proximal  humerus at the level of the surgical neck; A-fib with RVR . PMH: hypertension, hyperlipidemia, atrial fibrillation/flutter on Coumadin, CHF, hypothyroidism, anxiety, osteoarthritis, chronic nausea, GERD, recurrent UTIs.   Clinical Impression   PTA pt lives alone independently. Pt requires overall max A +2 for mobility and max to total A with ADL tasks due to below listed deficits. Pt appears  anxious and confused at times. Patient will benefit from continued inpatient follow up therapy, <3 hours/day to maximize functional level of independence. Acute OT to follow.       If plan is discharge home, recommend the following: Two people to help with walking and/or transfers;Two people to help with bathing/dressing/bathroom;Assist for transportation;Help with stairs or ramp for entrance    Functional Status Assessment  Patient has had a recent decline in their functional status and demonstrates the ability to make significant improvements in function in a reasonable and predictable amount of time.  Equipment Recommendations  BSC/3in1    Recommendations for Other Services       Precautions / Restrictions Precautions Precautions: Fall Required Braces or Orthoses: Sling Restrictions Weight Bearing Restrictions: Yes RUE Weight Bearing: Non weight bearing  R LE WBAT     Mobility Bed Mobility               OOB in chair; not ready to return to bed    Transfers -  Complaining of R hip pain. Able to partially push up with LUE however complaining of R hip pain. No note regarding WBS from ortho regarding hip                Balance Overall balance assessment:  Needs assistance Sitting-balance support: Feet supported Sitting balance-[name]: Fair Sitting balance - Comments: CGA                                   ADL either performed or assessed with clinical judgement   ADL                                               Vision Baseline Vision/History: 1 Wears glasses Vision Assessment?: Wears glasses for reading     Perception         Praxis         Pertinent Vitals/Pain Pain Assessment Pain Assessment: Faces Faces Pain Scale: Hurts whole lot Pain Location: RUE Pain Descriptors / Indicators: Grimacing, Guarding Pain Intervention(s): Limited activity within patient's tolerance, Premedicated before session, Repositioned, Ice applied     Extremity/Trunk Assessment Upper Extremity Assessment Upper Extremity Assessment: Right hand dominant;RUE deficits/detail RUE Deficits / Details: R humerus fx; hand WFL - using to hold pudding cup; using elbow minimally to assist with self feeding/holding cup; shoulder not assessed due to fx RUE Coordination: decreased gross motor   Lower Extremity Assessment Lower Extremity Assessment: RLE deficits/detail (R hip pain with weight bearing)  Cervical / Trunk Assessment Cervical / Trunk Assessment: Kyphotic   Communication Communication Communication: No apparent difficulties  Cognition Arousal: Alert Behavior During Therapy: WFL for tasks assessed/performed Overall Cognitive Status: No family/caregiver present to determine baseline cognitive functioning                                 General Comments: Pt A&Ox4, however has had confusion     General Comments  Complaining of "not feeling well". VSS. appears anxious; may be associated with pain; at risk for hospital related delirium    Exercises     Shoulder Instructions      Home Living Family/patient expects to be discharged to:: Skilled nursing facility Living Arrangements:  Alone Available Help at Discharge: Family;Available PRN/intermittently (family lives in [place] and [place]) Type of Home: House Home Access: Ramped entrance     Home Layout: One level     Bathroom Shower/Tub: Sponge bathes at baseline                    Prior Functioning/Environment Prior Level of Function : Needs assist               ADLs Comments: pt independent with ADL's/IADL's. Family provides assist with bringing groceries        OT Problem List: Decreased strength;Decreased range of motion;Decreased activity tolerance;Impaired balance (sitting and/or standing);Decreased coordination;Decreased cognition;Decreased safety awareness;Decreased knowledge of use of DME or AE;Decreased knowledge of precautions;Impaired UE functional use;Pain      OT Treatment/Interventions: Self-care/ADL training;Therapeutic exercise;DME and/or AE instruction;Therapeutic activities;Cognitive remediation/compensation;Patient/family education;Balance training    OT Goals(Current goals can be found in the care plan section) Acute Rehab OT Goals Patient Stated Goal: to get better OT Goal Formulation: With patient Time For Goal Achievement: 05/14/23 Potential to Achieve Goals: Good  OT Frequency: Min 1X/week    Co-evaluation              AM-PAC OT "6 Clicks" Daily Activity     Outcome Measure Help from another person eating meals?: A Little Help from another person taking care of personal grooming?: A Little Help from another person toileting, which includes using toliet, bedpan, or urinal?: A Lot Help from another person bathing (including washing, rinsing, drying)?: A Lot Help from another person to put on and taking off regular upper body clothing?: Total Help from another person to put on and taking off regular lower body clothing?: Total 6 Click Score: 12   End of Session Nurse Communication: Weight bearing status;Precautions;Patient requests pain meds  Activity  Tolerance: Patient tolerated treatment well Patient left: in chair;with chair alarm set;with call bell/phone within reach  OT Visit Diagnosis: Unsteadiness on feet (R26.81);Other abnormalities of gait and mobility (R26.89);Muscle weakness (generalized) (M62.81);Pain Pain - Right/Left: Right Pain - part of body: Shoulder                Time: 1610-9604 OT Time Calculation (min): 29 min Charges:  OT General Charges $OT Visit: 1 Visit OT Evaluation $OT Eval Moderate Complexity: 1 Mod OT Treatments $Self Care/Home Management : 8-22 mins  Luisa Dago, OT/L   Acute OT Clinical Specialist Acute Rehabilitation Services Pager [phone] Office [phone]   [facility] 04/30/2023, 4:26 PM

## 2023-04-30 NOTE — ED Notes (Signed)
Pts sacrum had stage 2 wound, with minium drainage.

## 2023-04-30 NOTE — Plan of Care (Signed)
  Problem: Education: Goal: Knowledge of General Education information will improve Description: Including pain rating scale, medication(s)/side effects and non-pharmacologic comfort measures Outcome: Progressing   Problem: Health Behavior/Discharge Planning: Goal: Ability to manage health-related needs will improve Outcome: Progressing   Problem: Pain Management: Goal: General experience of comfort will improve Outcome: Progressing

## 2023-04-30 NOTE — ED Notes (Signed)
ED TO INPATIENT HANDOFF REPORT  ED Nurse Name and Phone #: Jaquelyn Sakamoto  S Name/Age/Gender Charlotte Castro 89 y.o. female Room/Bed: 016C/016C  Code Status   Code Status: Full Code  Home/SNF/Other Home Patient oriented to: self, place, and situation Is this baseline? No   Triage Complete: Triage complete  Chief Complaint At risk for inadequate pain control [Z91.89]  Triage Note Pt arrives via EMS from home with mechanical fall yesterday around 1pm walking back from the bathroom. Pt normally uses a cane to ambulate. Pt was on the floor since the fall yesterday. Found by son this morning. Pts only complaint is right shoulder pain. Limited ROM in affect extremity. New onset AFIB RVR per EMS. Initial bp 90/40 repeat 164/94.    Allergies Allergies  Allergen Reactions   Iodinated Contrast Media Shortness Of Breath and Other (See Comments)    "Allergic," per MAR- Causes headaches, also   Zosyn [Piperacillin Sod-Tazobactam So] Other (See Comments)    "Allergic," per [facility]   Penicillins Other (See Comments)    Tolerated Zosyn Oct 2018, but "Allergic," per [facility] Did it involve swelling of the face/tongue/throat, SOB, or low BP? Unk Did it involve sudden or severe rash/hives, skin peeling, or any reaction on the inside of your mouth or nose? Unk Did you need to seek medical attention at a hospital or doctor's office? Unk When did it last happen? Unk If all above answers are "NO", may proceed with cephalosporin use.     Level of Care/Admitting Diagnosis ED Disposition     ED Disposition  Admit   Condition  --   Comment  Hospital Area: [facility] [[id number]]  Level of Care: Telemetry Medical [104]  May place patient in observation at [facility] or [facility] if equivalent level of care is available:: Yes  Covid Evaluation: Asymptomatic - no recent exposure (last 10 days) testing not required  Diagnosis: At risk for inadequate pain control [[id number]]  Admitting Physician:  Gery Pray [[id number]]  Attending Physician: Gery Pray [[id number]]          B Medical/Surgery History Past Medical History:  Diagnosis Date   Anemia    history of   Anxiety    Arthritis    "left knee" (04/05/2017)   Atrial fibrillation (HCC)    Atrial flutter (HCC)    typical appearing   Atrial tachycardia (HCC)    ablated 11/17/10  by JA  from the [facility] of the aorta   CHF (congestive heart failure) (HCC)    Chronic lower back pain    Chronic nausea    Endometrial cancer (HCC)    grade 1   Gallstone pancreatitis    Gastritis    GERD (gastroesophageal reflux disease)    History of hiatal hernia    HTN (hypertension)    Hyperlipemia    Hypothyroidism    Obesity    Osteoporosis    Toe ulcer (HCC)    left 3rd toe   Vitamin D deficiency    Past Surgical History:  Procedure Laterality Date   ATRIAL ABLATION SURGERY  11/17/10   Atrial tachycardia arising from [facility] of the aorta ablated by [facility] SURGERY     CARDIAC CATHETERIZATION  01/11/2011   [name] 01/12/2011   CHOLECYSTECTOMY N/A 04/08/2017   Procedure: LAPAROSCOPIC CHOLECYSTECTOMY;  Surgeon: Violeta Gelinas, MD;  Location: [facility] OR;  Service: General;  Laterality: N/A;   FRACTURE SURGERY     HYSTEROSCOPY WITH D & C N/A 08/05/2017  Procedure: DILATATION AND CURETTAGE /HYSTEROSCOPY AND POLYPECTOMY;  Surgeon: Tereso Newcomer, MD;  Location: [facility] ORS;  Service: Gynecology;  Laterality: N/A;   LUMBAR SPINE SURGERY  ~ 1998   ROBOTIC ASSISTED TOTAL HYSTERECTOMY WITH BILATERAL SALPINGO OOPHERECTOMY Bilateral 09/20/2017   Procedure: XI ROBOTIC ASSISTED TOTAL HYSTERECTOMY WITH BILATERAL SALPINGO OOPHORECTOMY, SENTINAL LYMPH NODE BIOPSY;  Surgeon: Adolphus Birchwood, MD;  Location: [facility] ORS;  Service: Gynecology;  Laterality: Bilateral;   SHOULDER SURGERY Right 1984 X2   /ntoes 01/19/2011   WRIST FRACTURE SURGERY Left 1983     A IV Location/Drains/Wounds Patient Lines/Drains/Airways Status     Active Line/Drains/Airways     Name  Placement date Placement time Site Days   Peripheral IV 04/29/23 18 G Left Antecubital 04/29/23  1127  Antecubital  1            Intake/Output Last 24 hours No intake or output data in the 24 hours ending 04/30/23 0425  Labs/Imaging Results for orders placed or performed during the hospital encounter of 04/29/23 (from the past 48 hour(s))  Comprehensive metabolic panel     Status: Abnormal   Collection Time: 04/29/23 11:29 AM  Result Value Ref Range   Sodium 140 135 - 145 mmol/L   Potassium 4.0 3.5 - 5.1 mmol/L   Chloride 107 98 - 111 mmol/L   CO2 21 (L) 22 - 32 mmol/L   Glucose, Bld 133 (H) 70 - 99 mg/dL    Comment: Glucose reference range applies only to samples taken after fasting for at least 8 hours.   BUN 25 (H) 8 - 23 mg/dL   Creatinine, Ser 1.61 (H) 0.44 - 1.00 mg/dL   Calcium 9.4 8.9 - 09.6 mg/dL   Total Protein 6.5 6.5 - 8.1 g/dL   Albumin 3.6 3.5 - 5.0 g/dL   AST 39 15 - 41 U/L   ALT 22 0 - 44 U/L   Alkaline Phosphatase 67 38 - 126 U/L   Total Bilirubin 2.6 (H) <1.2 mg/dL   GFR, Estimated 46 (L) >60 mL/min    Comment: (NOTE) Calculated using the CKD-EPI Creatinine Equation (2021)    Anion gap 12 5 - 15    Comment: Performed at [facility], [address] [place], [place] [address]  CBC with Differential     Status: Abnormal   Collection Time: 04/29/23 11:29 AM  Result Value Ref Range   WBC 17.3 (H) 4.0 - 10.5 K/uL   RBC 4.55 3.87 - 5.11 MIL/uL   Hemoglobin 15.0 12.0 - 15.0 g/dL   HCT 98.1 19.1 - 47.8 %   MCV 92.3 80.0 - 100.0 fL   MCH 33.0 26.0 - 34.0 pg   MCHC 35.7 30.0 - 36.0 g/dL   RDW 29.5 62.1 - 30.8 %   Platelets 158 150 - 400 K/uL   nRBC 0.0 0.0 - 0.2 %   Neutrophils Relative % 87 %   Neutro Abs 15.0 (H) 1.7 - 7.7 K/uL   Lymphocytes Relative 5 %   Lymphs Abs 0.9 0.7 - 4.0 K/uL   Monocytes Relative 7 %   Monocytes Absolute 1.3 (H) 0.1 - 1.0 K/uL   Eosinophils Relative 0 %   Eosinophils Absolute 0.0 0.0 - 0.5 K/uL   Basophils Relative  0 %   Basophils Absolute 0.0 0.0 - 0.1 K/uL   Immature Granulocytes 1 %   Abs Immature Granulocytes 0.11 (H) 0.00 - 0.07 K/uL    Comment: Performed at [facility], [address], [place], [place] [address]  Protime-INR     Status: Abnormal   Collection Time: 04/29/23 11:29 AM  Result Value Ref Range   Prothrombin Time 20.2 (H) 11.4 - 15.2 seconds   INR 1.7 (H) 0.8 - 1.2    Comment: (NOTE) INR goal varies based on device and disease states. Performed at [facility], [address], [place], [place] [address]   CK     Status: Abnormal   Collection Time: 04/29/23 11:29 AM  Result Value Ref Range   Total CK 707 (H) 38 - 234 U/L    Comment: Performed at [facility], [address], [place], [place] [address]   DG Ankle Complete Right  Result Date: 04/29/2023 CLINICAL DATA:  Fall yesterday with right ankle pain, initial encounter EXAM: RIGHT ANKLE - COMPLETE 3+ VIEW COMPARISON:  None Available. FINDINGS: There is no evidence of fracture, dislocation, or joint effusion. There is no evidence of arthropathy or other focal bone abnormality. Soft tissues are unremarkable. IMPRESSION: No acute abnormality noted. Electronically Signed   By: Alcide Clever M.D.   On: 04/29/2023 21:13   DG Femur Min 2 Views Right  Result Date: 04/29/2023 CLINICAL DATA:  Fall yesterday with persistent right leg pain, initial encounter EXAM: RIGHT FEMUR 2 VIEWS COMPARISON:  None Available. FINDINGS: Degenerative changes of the right hip joint are noted. No acute fracture or dislocation is seen. Degenerative changes about the knee joint are noted as well. No soft tissue abnormality is seen. IMPRESSION: Degenerative change without acute abnormality. Electronically Signed   By: Alcide Clever M.D.   On: 04/29/2023 21:07   DG Tibia/Fibula Right  Result Date: 04/29/2023 CLINICAL DATA:  Fall yesterday with right lower leg pain, initial encounter EXAM: RIGHT TIBIA AND FIBULA - 2 VIEW COMPARISON:  None  Available. FINDINGS: There is no evidence of fracture or other focal bone lesions. Soft tissues are unremarkable. IMPRESSION: No acute abnormality noted. Electronically Signed   By: Alcide Clever M.D.   On: 04/29/2023 21:07   DG Knee 2 Views Right  Result Date: 04/29/2023 CLINICAL DATA:  Recent fall yesterday with right knee pain, initial encounter EXAM: RIGHT KNEE - 2 VIEW COMPARISON:  None Available. FINDINGS: Tricompartmental degenerative changes are noted. No joint effusion is seen. No acute fracture or dislocation is noted. IMPRESSION: Degenerative change without acute abnormality. Electronically Signed   By: Alcide Clever M.D.   On: 04/29/2023 21:06   CT Hip Right Wo Contrast  Result Date: 04/29/2023 CLINICAL DATA:  Fall yesterday with right hip pain, initial encounter EXAM: CT OF THE RIGHT HIP WITHOUT CONTRAST TECHNIQUE: Multidetector CT imaging of the right hip was performed according to the standard protocol. Multiplanar CT image reconstructions were also generated. RADIATION DOSE REDUCTION: This exam was performed according to the departmental dose-optimization program which includes automated exposure control, adjustment of the mA and/or kV according to patient size and/or use of iterative reconstruction technique. COMPARISON:  Plain film from earlier in the same day. FINDINGS: Bones/Joint/Cartilage Degenerative changes of the right sacroiliac joint and lower lumbar spine are noted. Prior cage fusion at L4-5 and L5-S1 is noted. Degenerative changes of the right hip are seen with subchondral sclerosis and cyst formation. No acute fracture or dislocation is noted. Mild osteopenia is seen. No joint effusion is noted. Ligaments Suboptimally assessed by CT. Muscles and Tendons Surrounding musculature appears within normal limits. Soft tissues Surrounding soft tissue structures show no acute abnormality. IMPRESSION: Degenerative changes without acute  fracture. No soft tissue abnormality is noted.  Electronically Signed   By: Alcide Clever M.D.   On: 04/29/2023 21:05   DG Pelvis Portable  Result Date: 04/29/2023 CLINICAL DATA:  ? Hip fracture. EXAM: PORTABLE PELVIS 1 VIEWS COMPARISON:  None Available. FINDINGS: There is bilateral hip degenerative change with joint space narrowing and small osteophytes. Pelvic ring is intact. No osteolytic or osteoblastic lesions. Can not exclude right acetabular fracture. Consider CT correlation. IMPRESSION: Bilateral hip degenerative changes. Possible right acetabular fracture. Consider CT correlation. Electronically Signed   By: Layla Maw M.D.   On: 04/29/2023 18:18   CT Chest Wo Contrast  Result Date: 04/29/2023 CLINICAL DATA:  Fall. EXAM: CT CHEST WITHOUT CONTRAST TECHNIQUE: Multidetector CT imaging of the chest was performed following the standard protocol without IV contrast. RADIATION DOSE REDUCTION: This exam was performed according to the departmental dose-optimization program which includes automated exposure control, adjustment of the mA and/or kV according to patient size and/or use of iterative reconstruction technique. COMPARISON:  CT chest dated Oct 26, 2017. FINDINGS: Cardiovascular: Thoracic aorta is normal in caliber. Atherosclerotic calcifications of the thoracic aorta and arch branch vessels. Multivessel coronary artery calcifications. Similar enlargement of the left atrium. No pericardial effusion. The main pulmonary trunk is dilated measuring up to 3.4 cm. Mediastinum/Nodes: No enlarged mediastinal or axillary lymph nodes. Large hiatal hernia with intrathoracic stomach again noted. The trachea is unremarkable. Lungs/Pleura: Biapical pleural-parenchymal scarring. Stable 6 mm nodule in the left lower lobe (series 5, image 116) and 4 mm nodule in the right upper lobe (series 5, image 75), essentially unchanged since 2017, favoring a benign etiology. Calcified granulomas. Mild right basilar atelectasis. No pleural effusion. No pneumothorax.  Upper Abdomen: No acute abnormality. Large hiatal hernia/intrathoracic stomach, unchanged. Musculoskeletal: Comminuted transverse fracture of the right proximal humerus at the level of the surgical neck with mild impaction and approximately 2.4 cm of medial displacement of the distal segment (series 6, image 70). The glenoid appears intact. No additional fracture identified. IMPRESSION: 1. No acute traumatic intrathoracic findings. 2. Comminuted, displaced, and impacted fracture of the right proximal humerus at the level of the surgical neck. No additional fracture identified. 3. Enlargement of the main pulmonary artery, which can be seen in the setting of pulmonary arterial hypertension. 4.  Aortic Atherosclerosis (ICD10-I70.0). 5. Additional unchanged ancillary findings, as described above. Electronically Signed   By: Hart Robinsons M.D.   On: 04/29/2023 15:05   DG Shoulder Right  Result Date: 04/29/2023 CLINICAL DATA:  Fall.  Right shoulder pain. EXAM: RIGHT SHOULDER - 2+ VIEW COMPARISON:  Right shoulder radiographs dated May 14, 2021. FINDINGS: Comminuted transverse fracture of the proximal humerus at the level of the surgical neck with mild impaction and approximately 2.4 cm of medial displacement of the distal segment. The humeral head appears aligned with the glenoid with prominent marginal osteophytes. The glenoid appears intact. Acromioclavicular joint is anatomically aligned. Soft tissue swelling of the right shoulder. IMPRESSION: Comminuted, displaced, and impacted fracture of the right proximal humerus at the level of the surgical neck. Electronically Signed   By: Hart Robinsons M.D.   On: 04/29/2023 14:44   CT Lumbar Spine Wo Contrast  Result Date: 04/29/2023 CLINICAL DATA:  Neck trauma. Mechanical fall. Patient fell yesterday 1 p.m. and has been on the floor since. Was found down this morning. EXAM: CT LUMBAR SPINE WITHOUT CONTRAST TECHNIQUE: Multidetector CT imaging of the lumbar  spine was performed without intravenous contrast administration. Multiplanar CT image reconstructions  were also generated. RADIATION DOSE REDUCTION: This exam was performed according to the departmental dose-optimization program which includes automated exposure control, adjustment of the mA and/or kV according to patient size and/or use of iterative reconstruction technique. COMPARISON:  Lumbar spine radiographs 04/11/2022. FINDINGS: Segmentation: 5 non rib-bearing lumbar type vertebral bodies are present. The lowest fully formed vertebral body is L5. Alignment: Grade 1 anterolisthesis at L4-5 and L5-S1 is stable. Vertebrae: Chronic endplate sclerotic changes are again noted at L2-3. No acute fractures are present. Endplate changes related to fusion at L4 and L5 are stable. Paraspinal and other soft tissues: Atherosclerotic calcifications are present in the aorta without aneurysm. Cholecystectomy is noted. No solid organ lesions are present. No significant adenopathy is present. Disc levels: L1-2: Broad-based disc protrusion is present. Mild facet hypertrophy is present bilaterally. L2-3: Broad-based disc protrusion is asymmetric to the right. Mild subarticular narrowing is present bilaterally. Moderate right and mild left foraminal stenosis is present. L3-4: A broad-based disc protrusion is present. Facet hypertrophy is worse on the right. Mild subarticular narrowing is present bilaterally. Moderate right and mild left foraminal stenosis is present. L4-5: Fusion and laminectomy is present. No significant central canal stenosis is present. Severe left and mild right osseous foraminal stenosis is present. L5-S1: Laminectomies and fusion are noted. No residual or recurrent stenosis is present. IMPRESSION: 1. No acute fracture or traumatic subluxation. 2. Stable fusion and laminectomy at L4-5 and L5-S1. 3. Severe left and mild right osseous foraminal stenosis at L4-5. 4. Moderate right and mild left foraminal stenosis  at L2-3 and L3-4. 5. Mild subarticular narrowing bilaterally at L2-3 and L3-4. 6.  Aortic Atherosclerosis (ICD10-I70.0). Electronically Signed   By: Marin Roberts M.D.   On: 04/29/2023 13:09   CT Cervical Spine Wo Contrast  Result Date: 04/29/2023 CLINICAL DATA:  Mechanical fall yesterday. Patient was down overnight and found this morning. EXAM: CT CERVICAL SPINE WITHOUT CONTRAST TECHNIQUE: Multidetector CT imaging of the cervical spine was performed without intravenous contrast. Multiplanar CT image reconstructions were also generated. RADIATION DOSE REDUCTION: This exam was performed according to the departmental dose-optimization program which includes automated exposure control, adjustment of the mA and/or kV according to patient size and/or use of iterative reconstruction technique. COMPARISON:  MRI of the cervical spine 01/14/2021. Cervical spine radiographs 04/08/2022. FINDINGS: Alignment: Grade 1 anterolisthesis in C3-4 stable. No other significant listhesis is present. Straightening of the normal cervical lordosis is otherwise stable. Skull base and vertebrae: The craniocervical junction is normal. Vertebral body heights are normal. No acute fractures are present. Soft tissues and spinal canal: No prevertebral fluid or swelling. No visible canal hematoma. Atherosclerotic calcifications are present at the aortic arch and carotid bifurcations bilaterally without definite stenosis. No aneurysm or dissection is evident. Disc levels: Multilevel degenerative disc disease is present. Stenosis is again greatest at the left foramina of C5-6 and C6-7. Upper chest: The lung apices are clear. The thoracic inlet is within normal limits. No pneumothorax or significant pleural effusion is present. IMPRESSION: 1. No acute fracture or traumatic subluxation. 2. Multilevel degenerative disc disease. 3. Stenosis is again greatest at the left foramina of C5-6 and C6-7. 4.  Aortic Atherosclerosis (ICD10-I70.0).  Electronically Signed   By: Marin Roberts M.D.   On: 04/29/2023 12:07   CT Head Wo Contrast  Result Date: 04/29/2023 CLINICAL DATA:  Head trauma. Mechanical fall yesterday afternoon. Patient has been down since the fall. EXAM: CT HEAD WITHOUT CONTRAST TECHNIQUE: Contiguous axial images were obtained from the base  of the skull through the vertex without intravenous contrast. RADIATION DOSE REDUCTION: This exam was performed according to the departmental dose-optimization program which includes automated exposure control, adjustment of the mA and/or kV according to patient size and/or use of iterative reconstruction technique. COMPARISON:  MR head without contrast 01/16/2021. CT head without contrast 01/15/2021. FINDINGS: Brain: No acute infarct, hemorrhage, or mass lesion is present. Advanced atrophy and white matter disease is similar the prior exams. The ventricles are proportionate to the degree of atrophy. No significant extraaxial fluid collection is present. Insert normal brainstem The brainstem and cerebellum are within normal limits. Midline structures are within normal limits. Vascular: Atherosclerotic calcifications are present within the cavernous internal carotid arteries bilaterally. No hyperdense vessel is present. Skull: Calvarium is intact. No focal lytic or blastic lesions are present. No significant extracranial soft tissue lesion is present. Sinuses/Orbits: The paranasal sinuses and mastoid air cells are clear. The globes and orbits are within normal limits. IMPRESSION: 1. No acute intracranial abnormality or significant interval change. 2. Advanced atrophy and white matter disease is similar the prior exams. This likely reflects the sequela of chronic microvascular ischemia. Electronically Signed   By: Marin Roberts M.D.   On: 04/29/2023 11:56    Pending Labs Unresulted Labs (From admission, onward)     Start     Ordered   04/30/23 0500  Protime-INR  Daily,   R       04/29/23 2340   04/30/23 0500  Basic metabolic panel  Tomorrow morning,   R        04/30/23 0403   04/30/23 0500  Magnesium  Tomorrow morning,   R        04/30/23 0403   04/30/23 0500  CBC  Tomorrow morning,   R        04/30/23 0403   04/29/23 2338  Urinalysis, w/ Reflex to Culture (Infection Suspected) -Urine, Clean Catch  (Urine Labs)  Once,   URGENT       Question:  Specimen Source  Answer:  Urine, Clean Catch   04/29/23 2337            Vitals/Pain Today's Vitals   04/30/23 0222 04/30/23 0300 04/30/23 0330 04/30/23 0415  BP:  128/69 (!) 140/74 (!) 120/98  Pulse:  98 98 (!) 101  Resp: 20 15 12 20   Temp:      TempSrc:      SpO2:  98% 99% 99%  PainSc:        Isolation Precautions No active isolations  Medications Medications  metoprolol tartrate (LOPRESSOR) tablet 50 mg (50 mg Oral Given 04/30/23 0022)  oxyCODONE (Oxy IR/ROXICODONE) immediate release tablet 5 mg (5 mg Oral Given 04/30/23 0021)  HYDROmorphone (DILAUDID) injection 0.5 mg (has no administration in time range)  acetaminophen (TYLENOL) tablet 650 mg (650 mg Oral Patient Refused/Not Given 04/30/23 0022)  metoprolol tartrate (LOPRESSOR) injection 5 mg (has no administration in time range)  Warfarin - Pharmacist Dosing Inpatient (has no administration in time range)  acetaminophen (TYLENOL) tablet 650 mg (has no administration in time range)    Or  acetaminophen (TYLENOL) suppository 650 mg (has no administration in time range)  senna-docusate (Senokot-S) tablet 1 tablet (has no administration in time range)  carbidopa-levodopa (SINEMET IR) 25-100 MG per tablet immediate release 1 tablet (has no administration in time range)  levothyroxine (SYNTHROID) tablet 88 mcg (has no administration in time range)  morphine (PF) 2 MG/ML injection 2 mg (2 mg Intravenous Given 04/29/23 1133)  ondansetron (ZOFRAN) injection 4 mg (4 mg Intravenous Given 04/29/23 1133)  metoprolol tartrate (LOPRESSOR) injection 5 mg (5 mg  Intravenous Given 04/29/23 1754)  sodium chloride 0.9 % bolus 500 mL (0 mLs Intravenous Stopped 04/29/23 2147)  metoprolol tartrate (LOPRESSOR) injection 5 mg (5 mg Intravenous Given 04/29/23 2148)  warfarin (COUMADIN) tablet 5 mg (5 mg Oral Given 04/30/23 0022)    Mobility walks with device     Focused Assessments   R Recommendations: See Admitting Provider Note  Report given to:   Additional Notes: Pt ambulatory at baseline; continent x2. Wound noted on her sacrum.

## 2023-04-30 NOTE — Evaluation (Signed)
Physical Therapy Evaluation Patient Details Name: Charlotte Castro MRN: [id number] DOB: [birth year] Today's Date: 04/30/2023  History of Present Illness  Pt is a 89 y.o. F who presents after a fall and was found on the floor after approximately 15 hours. PMH: hypertension, hyperlipidemia, atrial fibrillation/flutter on Coumadin, CHF, hypothyroidism, anxiety, osteoarthritis, chronic nausea, GERD, recurrent UTIs.  Clinical Impression  PTA, pt lives at home alone with limited family support. Pt presents with decreased functional mobility secondary to generalized weakness, impaired sitting balance, RUE pain, decreased functional use of RUE, and cognitive deficits. Pt requiring use of lift equipment [facility]) to mobilize out of bed to chair. Patient will benefit from continued inpatient follow up therapy, <3 hours/day in order to address deficits and maximize functional mobility.         If plan is discharge home, recommend the following: Two people to help with walking and/or transfers;Two people to help with bathing/dressing/bathroom   Can travel by private vehicle   No    Equipment Recommendations Other (comment) (TBA)  Recommendations for Other Services       Functional Status Assessment Patient has had a recent decline in their functional status and demonstrates the ability to make significant improvements in function in a reasonable and predictable amount of time.     Precautions / Restrictions Precautions Precautions: Fall Required Braces or Orthoses: Sling Restrictions Weight Bearing Restrictions: Yes RUE Weight Bearing: Non weight bearing      Mobility  Bed Mobility               General bed mobility comments: Sitting EOB upon arrival with RN/NT    Transfers Overall transfer level: Needs assistance Equipment used: Ambulation equipment used Transfers: Sit to/from Stand Sit to Stand: Mod assist, +2 physical assistance           General transfer comment: ModA + 2  to power up to standing using stedy and pulling up with LUE. Use of bed pad to assist hips, able to achieve upright Transfer via Lift Equipment: Stedy  Ambulation/Gait                  Stairs            Wheelchair Mobility     Tilt Bed    Modified Rankin (Stroke Patients Only)       Balance Overall balance assessment: Needs assistance Sitting-balance support: Feet supported Sitting balance-[name]: Poor Sitting balance - Comments: CGA                                     Pertinent Vitals/Pain Pain Assessment Pain Assessment: Faces Faces Pain Scale: Hurts worst Pain Location: RUE Pain Descriptors / Indicators: Grimacing, Guarding Pain Intervention(s): Limited activity within patient's tolerance, Monitored during session, Repositioned    Home Living Family/patient expects to be discharged to:: Private residence Living Arrangements: Alone Available Help at Discharge: Family;Available PRN/intermittently (family lives in [place]) Type of Home: House Home Access: Ramped entrance       Home Layout: One level        Prior Function Prior Level of Function : Needs assist               ADLs Comments: pt independent with ADL's/IADL's. Family provides assist with bringing groceries     Extremity/Trunk Assessment   Upper Extremity Assessment Upper Extremity Assessment: Generalized weakness    Lower Extremity Assessment  Lower Extremity Assessment: Generalized weakness    Cervical / Trunk Assessment Cervical / Trunk Assessment: Kyphotic  Communication   Communication Communication: No apparent difficulties  Cognition Arousal: Alert Behavior During Therapy: WFL for tasks assessed/performed Overall Cognitive Status: No family/caregiver present to determine baseline cognitive functioning                                 General Comments: Pt A&Ox4, however has had confusion and trying to get out  of bed unassisted. Difficulty following 1-2 step commands        General Comments General comments (skin integrity, edema, etc.): BP 115/75, HR 104-114    Exercises     Assessment/Plan    PT Assessment Patient needs continued PT services  PT Problem List Decreased strength;Decreased activity tolerance;Decreased balance;Decreased mobility;Decreased cognition;Decreased safety awareness;Pain       PT Treatment Interventions DME instruction;Gait training;Functional mobility training;Therapeutic activities;Therapeutic exercise;Balance training;Patient/family education    PT Goals (Current goals can be found in the Care Plan section)  Acute Rehab PT Goals Patient Stated Goal: less pain PT Goal Formulation: With patient Time For Goal Achievement: 05/14/23 Potential to Achieve Goals: Fair    Frequency Min 1X/week     Co-evaluation               AM-PAC PT "6 Clicks" Mobility  Outcome Measure Help needed turning from your back to your side while in a flat bed without using bedrails?: A Lot Help needed moving from lying on your back to sitting on the side of a flat bed without using bedrails?: A Lot Help needed moving to and from a bed to a chair (including a wheelchair)?: Total Help needed standing up from a chair using your arms (e.g., wheelchair or bedside chair)?: Total Help needed to walk in hospital room?: Total Help needed climbing 3-5 steps with a railing? : Total 6 Click Score: 8    End of Session Equipment Utilized During Treatment: Gait belt Activity Tolerance: Patient limited by pain Patient left: in chair;with call bell/phone within reach;with chair alarm set Nurse Communication: Mobility status PT Visit Diagnosis: History of falling (Z91.81);Muscle weakness (generalized) (M62.81);Pain Pain - Right/Left: Right Pain - part of body: Arm    Time: 1610-9604 PT Time Calculation (min) (ACUTE ONLY): 18 min   Charges:   PT Evaluation $PT Eval Moderate  Complexity: 1 Mod   PT General Charges $$ ACUTE PT VISIT: 1 Visit         Lillia Pauls, PT, DPT Acute Rehabilitation Services Office [phone]   Norval Morton 04/30/2023, 10:20 AM

## 2023-05-01 DIAGNOSIS — M545 Low back pain, unspecified: Secondary | ICD-10-CM | POA: Diagnosis present

## 2023-05-01 DIAGNOSIS — Z79890 Hormone replacement therapy (postmenopausal): Secondary | ICD-10-CM | POA: Diagnosis not present

## 2023-05-01 DIAGNOSIS — I5032 Chronic diastolic (congestive) heart failure: Secondary | ICD-10-CM | POA: Diagnosis present

## 2023-05-01 DIAGNOSIS — K219 Gastro-esophageal reflux disease without esophagitis: Secondary | ICD-10-CM | POA: Diagnosis present

## 2023-05-01 DIAGNOSIS — Y92009 Unspecified place in unspecified non-institutional (private) residence as the place of occurrence of the external cause: Secondary | ICD-10-CM | POA: Diagnosis not present

## 2023-05-01 DIAGNOSIS — G8929 Other chronic pain: Secondary | ICD-10-CM | POA: Diagnosis present

## 2023-05-01 DIAGNOSIS — Z7901 Long term (current) use of anticoagulants: Secondary | ICD-10-CM | POA: Diagnosis not present

## 2023-05-01 DIAGNOSIS — S42201A Unspecified fracture of upper end of right humerus, initial encounter for closed fracture: Secondary | ICD-10-CM | POA: Diagnosis present

## 2023-05-01 DIAGNOSIS — Z91041 Radiographic dye allergy status: Secondary | ICD-10-CM | POA: Diagnosis not present

## 2023-05-01 DIAGNOSIS — W19XXXA Unspecified fall, initial encounter: Secondary | ICD-10-CM | POA: Diagnosis not present

## 2023-05-01 DIAGNOSIS — W1830XA Fall on same level, unspecified, initial encounter: Secondary | ICD-10-CM | POA: Diagnosis present

## 2023-05-01 DIAGNOSIS — I482 Chronic atrial fibrillation, unspecified: Secondary | ICD-10-CM | POA: Diagnosis not present

## 2023-05-01 DIAGNOSIS — I110 Hypertensive heart disease with heart failure: Secondary | ICD-10-CM | POA: Diagnosis present

## 2023-05-01 DIAGNOSIS — F419 Anxiety disorder, unspecified: Secondary | ICD-10-CM | POA: Diagnosis present

## 2023-05-01 DIAGNOSIS — E039 Hypothyroidism, unspecified: Secondary | ICD-10-CM | POA: Diagnosis present

## 2023-05-01 DIAGNOSIS — E785 Hyperlipidemia, unspecified: Secondary | ICD-10-CM | POA: Diagnosis present

## 2023-05-01 DIAGNOSIS — Z79899 Other long term (current) drug therapy: Secondary | ICD-10-CM | POA: Diagnosis not present

## 2023-05-01 DIAGNOSIS — Z888 Allergy status to other drugs, medicaments and biological substances status: Secondary | ICD-10-CM | POA: Diagnosis not present

## 2023-05-01 DIAGNOSIS — M6282 Rhabdomyolysis: Secondary | ICD-10-CM | POA: Diagnosis present

## 2023-05-01 DIAGNOSIS — Z8542 Personal history of malignant neoplasm of other parts of uterus: Secondary | ICD-10-CM | POA: Diagnosis not present

## 2023-05-01 DIAGNOSIS — I4821 Permanent atrial fibrillation: Secondary | ICD-10-CM | POA: Diagnosis present

## 2023-05-01 DIAGNOSIS — Z880 Allergy status to penicillin: Secondary | ICD-10-CM | POA: Diagnosis not present

## 2023-05-01 DIAGNOSIS — Z8249 Family history of ischemic heart disease and other diseases of the circulatory system: Secondary | ICD-10-CM | POA: Diagnosis not present

## 2023-05-01 LAB — CBC WITH DIFFERENTIAL/PLATELET
Abs Immature Granulocytes: 0.1 10*3/uL — ABNORMAL HIGH (ref 0.00–0.07)
Basophils Absolute: 0.1 10*3/uL (ref 0.0–0.1)
Basophils Relative: 0 %
Eosinophils Absolute: 0.1 10*3/uL (ref 0.0–0.5)
Eosinophils Relative: 1 %
HCT: 36.2 % (ref 36.0–46.0)
Hemoglobin: 12.6 g/dL (ref 12.0–15.0)
Immature Granulocytes: 1 %
Lymphocytes Relative: 11 %
Lymphs Abs: 1.3 10*3/uL (ref 0.7–4.0)
MCH: 32.8 pg (ref 26.0–34.0)
MCHC: 34.8 g/dL (ref 30.0–36.0)
MCV: 94.3 fL (ref 80.0–100.0)
Monocytes Absolute: 1.1 10*3/uL — ABNORMAL HIGH (ref 0.1–1.0)
Monocytes Relative: 10 %
Neutro Abs: 8.9 10*3/uL — ABNORMAL HIGH (ref 1.7–7.7)
Neutrophils Relative %: 77 %
Platelets: 150 10*3/uL (ref 150–400)
RBC: 3.84 MIL/uL — ABNORMAL LOW (ref 3.87–5.11)
RDW: 13.2 % (ref 11.5–15.5)
WBC: 11.6 10*3/uL — ABNORMAL HIGH (ref 4.0–10.5)
nRBC: 0 % (ref 0.0–0.2)

## 2023-05-01 LAB — BASIC METABOLIC PANEL
Anion gap: 6 (ref 5–15)
BUN: 35 mg/dL — ABNORMAL HIGH (ref 8–23)
CO2: 25 mmol/L (ref 22–32)
Calcium: 8.9 mg/dL (ref 8.9–10.3)
Chloride: 108 mmol/L (ref 98–111)
Creatinine, Ser: 1.25 mg/dL — ABNORMAL HIGH (ref 0.44–1.00)
GFR, Estimated: 41 mL/min — ABNORMAL LOW (ref >60)
Glucose, Bld: 113 mg/dL — ABNORMAL HIGH (ref 70–99)
Potassium: 4.9 mmol/L (ref 3.5–5.1)
Sodium: 139 mmol/L (ref 135–145)

## 2023-05-01 LAB — PROTIME-INR
INR: 2.4 — ABNORMAL HIGH (ref 0.8–1.2)
Prothrombin Time: 26.2 s — ABNORMAL HIGH (ref 11.4–15.2)

## 2023-05-01 LAB — MAGNESIUM: Magnesium: 1.8 mg/dL (ref 1.7–2.4)

## 2023-05-01 MED ORDER — WARFARIN SODIUM 2.5 MG PO TABS: 2.5000 mg | ORAL_TABLET | Freq: Once | ORAL | Status: DC

## 2023-05-01 MED ORDER — WARFARIN SODIUM 5 MG PO TABS
5.0000 mg | ORAL_TABLET | Freq: Once | ORAL | Status: AC
  Administered 2023-05-01: 5 mg via ORAL
  Filled 2023-05-01: qty 1

## 2023-05-01 NOTE — Care Management Obs Status (Signed)
MEDICARE OBSERVATION STATUS NOTIFICATION   Patient Details  Name: Charlotte Castro MRN: [id number] Date of Birth: [birth year]   Medicare Observation Status Notification Given:  Yes    Ronny Bacon, RN 05/01/2023, 8:36 AM

## 2023-05-01 NOTE — Progress Notes (Addendum)
PHARMACY - ANTICOAGULATION CONSULT NOTE  Pharmacy Consult for Coumadin Indication: atrial fibrillation  Allergies  Allergen Reactions   Iodinated Contrast Media Shortness Of Breath and Other (See Comments)    "Allergic," per MAR- Causes headaches, also   Zosyn [Piperacillin Sod-Tazobactam So] Other (See Comments)    "Allergic," per [facility]   Penicillins Other (See Comments)    Tolerated Zosyn Oct 2018, but "Allergic," per [facility] Did it involve swelling of the face/tongue/throat, SOB, or low BP? Unk Did it involve sudden or severe rash/hives, skin peeling, or any reaction on the inside of your mouth or nose? Unk Did you need to seek medical attention at a hospital or doctor's office? Unk When did it last happen? Unk If all above answers are "NO", may proceed with cephalosporin use.     Patient Measurements: Height: 5\' 9"  (175.3 cm) Weight: 88.2 kg (194 lb 7.1 oz) IBW/kg (Calculated) : 66.2  Vital Signs: Temp: 97.9 F (36.6 C) (11/17 0816) Temp Source: Oral (11/17 0816) BP: 118/57 (11/17 0816) Pulse Rate: 100 (11/17 0816)  Labs: Recent Labs    04/29/23 1129 04/30/23 0439 04/30/23 1028 05/01/23 0554  HGB 15.0 13.4  --  12.6  HCT 42.0 39.2  --  36.2  PLT 158 152  --  150  LABPROT 20.2* 22.0*  --  26.2*  INR 1.7* 1.9*  --  2.4*  CREATININE 1.14* 0.97  --  1.25*  CKTOTAL 707*  --  304*  --     Estimated Creatinine Clearance: 36.8 mL/min (A) (by C-G formula based on SCr of 1.25 mg/dL (H)).   Medical History: Past Medical History:  Diagnosis Date   Anemia    history of   Anxiety    Arthritis    "left knee" (04/05/2017)   Atrial fibrillation (HCC)    Atrial flutter (HCC)    typical appearing   Atrial tachycardia (HCC)    ablated 11/17/10  by JA  from the [facility] of the aorta   CHF (congestive heart failure) (HCC)    Chronic lower back pain    Chronic nausea    Endometrial cancer (HCC)    grade 1   Gallstone pancreatitis    Gastritis    GERD (gastroesophageal reflux  disease)    History of hiatal hernia    HTN (hypertension)    Hyperlipemia    Hypothyroidism    Obesity    Osteoporosis    Toe ulcer (HCC)    left 3rd toe   Vitamin D deficiency     Medications:  No current facility-administered medications on file prior to encounter.   Current Outpatient Medications on File Prior to Encounter  Medication Sig Dispense Refill   acetaminophen (TYLENOL) 650 MG CR tablet Take 650 mg by mouth daily.     carbidopa-levodopa (SINEMET IR) 25-100 MG tablet Take 1 tablet by mouth 3 (three) times daily. 8am/noon/4pm 270 tablet 1   levothyroxine (SYNTHROID) 88 MCG tablet TAKE ONE TABLET BY MOUTH EVERY DAY BEFORE BREAKFAST 90 tablet 0   metoprolol tartrate (LOPRESSOR) 50 MG tablet TAKE ONE TABLET TWICE DAILY WITH A MEAL 180 tablet 2   vitamin B-12 (CYANOCOBALAMIN) 1000 MCG tablet Take 1,000 mcg by mouth daily.     warfarin (COUMADIN) 5 MG tablet TAKE ONE-HALF TO ONE TABLET DAILY AS DIRECTED BY COUMADIN CLINIC (Patient taking differently: Take 5 mg by mouth at bedtime. Take 1 tablet Mon, Wed, and Fri. Take one-half tablet Sun, Tues, Thurs, and Sat. As DIRECTED BY COUMADIN CLINIC)  30 tablet 3     Assessment: 89 y.o. female admitted s/p fall, h/o Afib, to continue Coumadin. Current home regimen Coumadin 2.5 mg MWF and 5 mg TTSS  INR today therapeutic at 2.4, up from 1.9 yesterday. CBC remains stable. No signs of bleeding per chart. Note boosted dose (5 mg rather than 2.5) received on 11/15. Will resume home dose.  Goal of Therapy:  INR 2-3 Monitor platelets by anticoagulation protocol: Yes   Plan:  Coumadin 5 mg today F/U daily INR  Lora Paula, PharmD PGY-2 Infectious Diseases Pharmacy Resident [facility] 05/01/2023 8:44 AM

## 2023-05-01 NOTE — TOC Initial Note (Signed)
Transition of Care [facility]) - Initial/Assessment Note    Patient Details  Name: Charlotte Castro MRN: [id number] Date of Birth: 05-29-1933  Transition of Care [facility]) CM/SW Contact:    Baldemar Lenis, LCSW Phone Number: 05/01/2023, 11:29 AM  Clinical Narrative:     Patient from home alone, recommendation for SNF at discharge. CSW spoke with daughter, Tyler Pita, who reported that patient and family are in agreement. Patient has been in SNF before, preference for [facility] if possible. Son lives in [place], so would like to keep the patient as close to there as possible. CSW completed referral and faxed out, will follow with bed offers.              Expected Discharge Plan: Skilled Nursing Facility Barriers to Discharge: Continued Medical Work up, English as a second language teacher   Patient Goals and CMS Choice Patient states their goals for this hospitalization and ongoing recovery are:: patient unable to participate in goal setting, not fully oriented CMS Medicare.gov Compare Post Acute Care list provided to:: Patient Represenative (must comment) Choice offered to / list presented to : Adult Children Effort ownership interest in [facility].provided to:: Adult Children    Expected Discharge Plan and Services     Post Acute Care Choice: Skilled Nursing Facility Living arrangements for the past 2 months: Single Family Home                                      Prior Living Arrangements/Services Living arrangements for the past 2 months: Single Family Home Lives with:: Self Patient language and need for interpreter reviewed:: No Do you feel safe going back to the place where you live?: Yes      Need for Family Participation in Patient Care: Yes (Comment) Care giver support system in place?: No (comment)   Criminal Activity/Legal Involvement Pertinent to Current Situation/Hospitalization: No - Comment as needed  Activities of Daily Living   ADL Screening (condition  at time of admission) Independently performs ADLs?: Yes (appropriate for developmental age) Does the patient have a NEW difficulty with bathing/dressing/toileting/self-feeding that is expected to last >3 days?: No Does the patient have a NEW difficulty with getting in/out of bed, walking, or climbing stairs that is expected to last >3 days?: No Does the patient have a NEW difficulty with communication that is expected to last >3 days?: No Is the patient deaf or have difficulty hearing?: No Does the patient have difficulty seeing, even when wearing glasses/contacts?: No Does the patient have difficulty concentrating, remembering, or making decisions?: No  Permission Sought/Granted Permission sought to share information with : Facility Medical sales representative, Family Supports Permission granted to share information with : Yes, Verbal Permission Granted  Share Information with NAME: Donneta Romberg  Permission granted to share info w AGENCY: SNF  Permission granted to share info w Relationship: Children     Emotional Assessment Appearance:: Appears stated age Attitude/Demeanor/Rapport: Unable to Assess Affect (typically observed): Unable to Assess Orientation: : Oriented to Self, Oriented to Place, Oriented to Situation   Psych Involvement: No (comment)  Admission diagnosis:  Fall, initial encounter [W19.XXXA] Closed fracture of proximal end of right humerus, unspecified fracture morphology, initial encounter [S42.201A] At risk for inadequate pain control [Z91.89] Patient Active Problem List   Diagnosis Date Noted   At risk for inadequate pain control 04/30/2023   Fall at home, initial encounter 04/29/2023   Humerus fracture  04/29/2023   Inadequate pain control 04/29/2023   Chronic atrial fibrillation with RVR (HCC) 04/29/2023   Arthritis, multiple joint involvement 04/11/2022   Radiculopathy 04/11/2022   Chest pain 01/13/2021   Orthostasis 01/13/2021   Abdominal pain    Acute  cystitis without hematuria    Hiatal hernia 01/29/2019   AKI (acute kidney injury) (HCC) 01/14/2019   B12 deficiency 08/08/2018   Hypothyroidism 08/08/2018   Endometrial cancer (HCC) 08/05/2017   Toe ulcer, left, with unspecified severity (HCC) 08/04/2017   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 [place] [address] Phone: [phone] Fax: [phone]     Social Determinants of Health (SDOH) Social History: SDOH Screenings   Food Insecurity: No Food Insecurity (04/30/2023)  Housing: Low Risk  (04/30/2023)  Transportation Needs: No Transportation Needs (04/30/2023)  Utilities: Not At Risk (04/30/2023)  Depression (PHQ2-9): Medium Risk (03/02/2023)  Tobacco Use: Low Risk  (04/29/2023)   SDOH Interventions:     Readmission Risk Interventions     No data to display

## 2023-05-01 NOTE — [place] FL2 (Signed)
[place] MEDICAID FL2 LEVEL OF CARE FORM     IDENTIFICATION  Patient Name: Charlotte Castro Birthdate: 31-Oct-1932 Sex: female Admission Date (Current Location): 04/29/2023  [facility] and [place] Number:  Producer, television/film/video and Address:  [facility] [place], [address], [place], [place] [address]      Provider Number: [id number]  Attending Physician Name and Address:  Glade Lloyd, MD  Relative Name and Phone Number:       Current Level of Care: Hospital Recommended Level of Care: Skilled Nursing Facility Prior Approval Number:    Date Approved/Denied:   PASRR Number: [id number]  Discharge Plan: SNF    Current Diagnoses: Patient Active Problem List   Diagnosis Date Noted   At risk for inadequate pain control 04/30/2023   Fall at home, initial encounter 04/29/2023   Humerus fracture 04/29/2023   Inadequate pain control 04/29/2023   Chronic atrial fibrillation with RVR (HCC) 04/29/2023   Arthritis, multiple joint involvement 04/11/2022   Radiculopathy 04/11/2022   Chest pain 01/13/2021   Orthostasis 01/13/2021   Abdominal pain    Acute cystitis without hematuria    Hiatal hernia 01/29/2019   AKI (acute kidney injury) (HCC) 01/14/2019   B12 deficiency 08/08/2018   Hypothyroidism 08/08/2018   Endometrial cancer (HCC) 08/05/2017   Toe ulcer, left, with unspecified severity (HCC) 08/04/2017   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) 08/04/2017   Postmenopausal bleeding 08/01/2017   Epigastric abdominal pain 04/05/2017   Delirium 04/14/2016   Tremor 04/14/2016   Chronic diastolic CHF (congestive heart failure) (HCC) 04/10/2016   Essential hypertension, benign 01/18/2014   Long term current use of anticoagulant therapy 01/18/2014   Permanent atrial fibrillation (HCC) 06/10/2011    Orientation RESPIRATION BLADDER Height & Weight     Self, Situation, Place  Normal Continent Weight: 194 lb 7.1 oz (88.2  kg) Height:  5\' 9"  (175.3 cm)  BEHAVIORAL SYMPTOMS/MOOD NEUROLOGICAL BOWEL NUTRITION STATUS      Continent Diet (heart healthy)  AMBULATORY STATUS COMMUNICATION OF NEEDS Skin   Extensive Assist Verbally PU Stage and Appropriate Care   PU Stage 2 Dressing:  (foam dressing: lift every shift to assess, change PRN)                   Personal Care Assistance Level of Assistance  Bathing, Feeding, Dressing Bathing Assistance: Maximum assistance Feeding assistance: Limited assistance Dressing Assistance: Maximum assistance     Functional Limitations Info  Sight Sight Info: Impaired (glasses)        SPECIAL CARE FACTORS FREQUENCY  PT (By licensed PT), OT (By licensed OT)     PT Frequency: 5x/wk OT Frequency: 5x/wk            Contractures Contractures Info: Not present    Additional Factors Info  Code Status, Allergies Code Status Info: Full Allergies Info: Iodinated Contrast Media, Zosyn (Piperacillin Sod-tazobactam So), Penicillins           Current Medications (05/01/2023):  This is the current hospital active medication list Current Facility-Administered Medications  Medication Dose Route Frequency Provider Last Rate Last Admin   acetaminophen (TYLENOL) tablet 650 mg  650 mg Oral Q6H PRN Crosley, Debby, MD       Or   acetaminophen (TYLENOL) suppository 650 mg  650 mg Rectal Q6H PRN Crosley, Debby, MD       acetaminophen (TYLENOL) tablet 650 mg  650 mg Oral Q6H Gery Pray, MD  650 mg at 05/01/23 0558   carbidopa-levodopa (SINEMET IR) 25-100 MG per tablet immediate release 1 tablet  1 tablet Oral TID Gery Pray, MD   1 tablet at 05/01/23 0848   HYDROmorphone (DILAUDID) injection 0.5 mg  0.5 mg Intravenous Q3H PRN Joneen Roach, Debby, MD   0.5 mg at 04/30/23 0603   levothyroxine (SYNTHROID) tablet 88 mcg  88 mcg Oral Q0600 Crosley, Debby, MD   88 mcg at 05/01/23 0555   metoprolol tartrate (LOPRESSOR) injection 5 mg  5 mg Intravenous Q4H PRN Crosley, Debby, MD        metoprolol tartrate (LOPRESSOR) tablet 50 mg  50 mg Oral BID Karmen Stabs, MD   50 mg at 05/01/23 0920   oxyCODONE (Oxy IR/ROXICODONE) immediate release tablet 5 mg  5 mg Oral Q4H PRN Joneen Roach, Debby, MD   5 mg at 05/01/23 0849   senna-docusate (Senokot-S) tablet 1 tablet  1 tablet Oral QHS PRN Crosley, Debby, MD       warfarin (COUMADIN) tablet 5 mg  5 mg Oral ONCE-1600 Glade Lloyd, MD       Warfarin - Pharmacist Dosing Inpatient   Does not apply q1600 Rondel Baton, MD         Discharge Medications: Please see discharge summary for a list of discharge medications.  Relevant Imaging Results:  Relevant Lab Results:   Additional Information SS#: [id number]  Baldemar Lenis, LCSW

## 2023-05-01 NOTE — Plan of Care (Signed)
  Problem: Education: Goal: Knowledge of General Education information will improve Description: Including pain rating scale, medication(s)/side effects and non-pharmacologic comfort measures Outcome: Progressing   Problem: Activity: Goal: Risk for activity intolerance will decrease Outcome: Progressing   Problem: Nutrition: Goal: Adequate nutrition will be maintained Outcome: Progressing   Problem: Coping: Goal: Level of anxiety will decrease Outcome: Progressing   Problem: Pain Management: Goal: General experience of comfort will improve Outcome: Progressing   Problem: Safety: Goal: Ability to remain free from injury will improve Outcome: Progressing

## 2023-05-01 NOTE — Progress Notes (Signed)
PROGRESS NOTE    Charlotte Castro  GNF:[id number] DOB: Mar 31, 1933 DOA: 04/29/2023 PCP: Philip Aspen, Limmie Patricia, MD   Brief Narrative:  89 year old female with past medical history of HTN, arthritis, A-fib, chronic diastolic heart failure, hypothyroidism, tremors with possible recent diagnosis of parkinsonism for which she was started on carbidopa levodopa by neurology as an outpatient and recurrent UTIs presented with a fall and was found to have comminuted fracture of right proximal humerus.  ED provider contacted orthopedics who recommended outpatient follow-up with orthopedics.  Patient also had A-fib with RVR.  Assessment & Plan:   Acute comminuted fracture of right proximal humerus following a fall -ED provider contacted orthopedics who recommended outpatient follow-up with orthopedics.  Right upper extremity splint placed in the ED. -Fall precautions.  Pain management. -PT/OT recommended placement.  TOC consulted.  Permanent A-fib with RVR -Currently rate controlled.  Continue metoprolol.  Continue Coumadin.  Coumadin to be dosed as per pharmacy based on INR.  Monitor INR.  Leukocytosis -Most likely reactive.  Improving.  Monitor  Elevated CK -CK elevated only minimally at 707 on presentation.  Treated with IV fluids.  Encourage oral intake.  Of IV fluids.  Essential hypertension -Continue metoprolol  History of tremors with?  New diagnosis of parkinsonism -Continue levodopa carbidopa.  Outpatient follow-up with neurology.  Hypothyroidism -Continue levothyroxine   DVT prophylaxis: Coumadin Code Status: Full Family Communication: Daughter on phone on 04/30/2023 Disposition Plan: Status is: inpatient because: Severity of illness.  Need for possible SNF placement  Consultants: None  Procedures: None  Antimicrobials: None   Subjective: Patient seen and examined at bedside.  No fever, chest pain, vomiting reported.  Complains of left upper extremity pain.  Still  feels weak. Objective: Vitals:   04/30/23 2019 05/01/23 0003 05/01/23 0500 05/01/23 0546  BP: 139/72 117/65  125/75  Pulse: 100 98  98  Resp:  17  17  Temp: 97.8 F (36.6 C) 97.8 F (36.6 C)  (!) 97.5 F (36.4 C)  TempSrc: Oral Oral  Oral  SpO2: 99% 99%  99%  Weight:   88.2 kg   Height:        Intake/Output Summary (Last 24 hours) at 05/01/2023 0734 Last data filed at 04/30/2023 2030 Gross per 24 hour  Intake 240 ml  Output --  Net 240 ml   Filed Weights   04/30/23 0528 05/01/23 0500  Weight: 88.5 kg 88.2 kg    Examination:  General: Currently on room air.  No distress.  Chronically ill and deconditioned looking ENT/neck: No thyromegaly.  JVD is not elevated  respiratory: Decreased breath sounds at bases bilaterally with some crackles; no wheezing  CVS: S1-S2 heard, rate controlled Abdominal: Soft, nontender, slightly distended; no organomegaly, bowel sounds are heard Extremities: Trace lower extremity edema; no cyanosis.  Right shoulder swelling and tenderness with bruising  CNS: Weak, still very slow to respond.  Poor historian.  No focal neurologic deficit.  Moves extremities Lymph: No obvious lymphadenopathy Skin: No obvious ecchymosis/lesions  psych: Mostly flat affect.  Not agitated currently.   Musculoskeletal: No obvious joint swelling/deformity    Data Reviewed: I have personally reviewed following labs and imaging studies  CBC: Recent Labs  Lab 04/29/23 1129 04/30/23 0439 05/01/23 0554  WBC 17.3* 14.8* 11.6*  NEUTROABS 15.0*  --  8.9*  HGB 15.0 13.4 12.6  HCT 42.0 39.2 36.2  MCV 92.3 94.7 94.3  PLT 158 152 150   Basic Metabolic Panel: Recent Labs  Lab 04/29/23 1129  04/30/23 0439 05/01/23 0554  NA 140 141 139  K 4.0 3.8 4.9  CL 107 110 108  CO2 21* 22 25  GLUCOSE 133* 84 113*  BUN 25* 27* 35*  CREATININE 1.14* 0.97 1.25*  CALCIUM 9.4 9.0 8.9  MG  --  1.8 1.8   GFR: Estimated Creatinine Clearance: 36.8 mL/min (A) (by C-G formula  based on SCr of 1.25 mg/dL (H)). Liver Function Tests: Recent Labs  Lab 04/29/23 1129  AST 39  ALT 22  ALKPHOS 67  BILITOT 2.6*  PROT 6.5  ALBUMIN 3.6   No results for input(s): "LIPASE", "AMYLASE" in the last 168 hours. No results for input(s): "AMMONIA" in the last 168 hours. Coagulation Profile: Recent Labs  Lab 04/29/23 1129 04/30/23 0439 05/01/23 0554  INR 1.7* 1.9* 2.4*   Cardiac Enzymes: Recent Labs  Lab 04/29/23 1129 04/30/23 1028  CKTOTAL 707* 304*   BNP (last 3 results) No results for input(s): "PROBNP" in the last 8760 hours. HbA1C: No results for input(s): "HGBA1C" in the last 72 hours. CBG: No results for input(s): "GLUCAP" in the last 168 hours. Lipid Profile: No results for input(s): "CHOL", "HDL", "LDLCALC", "TRIG", "CHOLHDL", "LDLDIRECT" in the last 72 hours. Thyroid Function Tests: No results for input(s): "TSH", "T4TOTAL", "FREET4", "T3FREE", "THYROIDAB" in the last 72 hours. Anemia Panel: No results for input(s): "VITAMINB12", "FOLATE", "FERRITIN", "TIBC", "IRON", "RETICCTPCT" in the last 72 hours. Sepsis Labs: No results for input(s): "PROCALCITON", "LATICACIDVEN" in the last 168 hours.  No results found for this or any previous visit (from the past 240 hour(s)).       Radiology Studies: DG Ankle Complete Right  Result Date: 04/29/2023 CLINICAL DATA:  Fall yesterday with right ankle pain, initial encounter EXAM: RIGHT ANKLE - COMPLETE 3+ VIEW COMPARISON:  None Available. FINDINGS: There is no evidence of fracture, dislocation, or joint effusion. There is no evidence of arthropathy or other focal bone abnormality. Soft tissues are unremarkable. IMPRESSION: No acute abnormality noted. Electronically Signed   By: Alcide Clever M.D.   On: 04/29/2023 21:13   DG Femur Min 2 Views Right  Result Date: 04/29/2023 CLINICAL DATA:  Fall yesterday with persistent right leg pain, initial encounter EXAM: RIGHT FEMUR 2 VIEWS COMPARISON:  None Available.  FINDINGS: Degenerative changes of the right hip joint are noted. No acute fracture or dislocation is seen. Degenerative changes about the knee joint are noted as well. No soft tissue abnormality is seen. IMPRESSION: Degenerative change without acute abnormality. Electronically Signed   By: Alcide Clever M.D.   On: 04/29/2023 21:07   DG Tibia/Fibula Right  Result Date: 04/29/2023 CLINICAL DATA:  Fall yesterday with right lower leg pain, initial encounter EXAM: RIGHT TIBIA AND FIBULA - 2 VIEW COMPARISON:  None Available. FINDINGS: There is no evidence of fracture or other focal bone lesions. Soft tissues are unremarkable. IMPRESSION: No acute abnormality noted. Electronically Signed   By: Alcide Clever M.D.   On: 04/29/2023 21:07   DG Knee 2 Views Right  Result Date: 04/29/2023 CLINICAL DATA:  Recent fall yesterday with right knee pain, initial encounter EXAM: RIGHT KNEE - 2 VIEW COMPARISON:  None Available. FINDINGS: Tricompartmental degenerative changes are noted. No joint effusion is seen. No acute fracture or dislocation is noted. IMPRESSION: Degenerative change without acute abnormality. Electronically Signed   By: Alcide Clever M.D.   On: 04/29/2023 21:06   CT Hip Right Wo Contrast  Result Date: 04/29/2023 CLINICAL DATA:  Fall yesterday with right hip pain,  initial encounter EXAM: CT OF THE RIGHT HIP WITHOUT CONTRAST TECHNIQUE: Multidetector CT imaging of the right hip was performed according to the standard protocol. Multiplanar CT image reconstructions were also generated. RADIATION DOSE REDUCTION: This exam was performed according to the departmental dose-optimization program which includes automated exposure control, adjustment of the mA and/or kV according to patient size and/or use of iterative reconstruction technique. COMPARISON:  Plain film from earlier in the same day. FINDINGS: Bones/Joint/Cartilage Degenerative changes of the right sacroiliac joint and lower lumbar spine are noted. Prior  cage fusion at L4-5 and L5-S1 is noted. Degenerative changes of the right hip are seen with subchondral sclerosis and cyst formation. No acute fracture or dislocation is noted. Mild osteopenia is seen. No joint effusion is noted. Ligaments Suboptimally assessed by CT. Muscles and Tendons Surrounding musculature appears within normal limits. Soft tissues Surrounding soft tissue structures show no acute abnormality. IMPRESSION: Degenerative changes without acute fracture. No soft tissue abnormality is noted. Electronically Signed   By: Alcide Clever M.D.   On: 04/29/2023 21:05   DG Pelvis Portable  Result Date: 04/29/2023 CLINICAL DATA:  ? Hip fracture. EXAM: PORTABLE PELVIS 1 VIEWS COMPARISON:  None Available. FINDINGS: There is bilateral hip degenerative change with joint space narrowing and small osteophytes. Pelvic ring is intact. No osteolytic or osteoblastic lesions. Can not exclude right acetabular fracture. Consider CT correlation. IMPRESSION: Bilateral hip degenerative changes. Possible right acetabular fracture. Consider CT correlation. Electronically Signed   By: Layla Maw M.D.   On: 04/29/2023 18:18   CT Chest Wo Contrast  Result Date: 04/29/2023 CLINICAL DATA:  Fall. EXAM: CT CHEST WITHOUT CONTRAST TECHNIQUE: Multidetector CT imaging of the chest was performed following the standard protocol without IV contrast. RADIATION DOSE REDUCTION: This exam was performed according to the departmental dose-optimization program which includes automated exposure control, adjustment of the mA and/or kV according to patient size and/or use of iterative reconstruction technique. COMPARISON:  CT chest dated Oct 26, 2017. FINDINGS: Cardiovascular: Thoracic aorta is normal in caliber. Atherosclerotic calcifications of the thoracic aorta and arch branch vessels. Multivessel coronary artery calcifications. Similar enlargement of the left atrium. No pericardial effusion. The main pulmonary trunk is dilated  measuring up to 3.4 cm. Mediastinum/Nodes: No enlarged mediastinal or axillary lymph nodes. Large hiatal hernia with intrathoracic stomach again noted. The trachea is unremarkable. Lungs/Pleura: Biapical pleural-parenchymal scarring. Stable 6 mm nodule in the left lower lobe (series 5, image 116) and 4 mm nodule in the right upper lobe (series 5, image 75), essentially unchanged since 2017, favoring a benign etiology. Calcified granulomas. Mild right basilar atelectasis. No pleural effusion. No pneumothorax. Upper Abdomen: No acute abnormality. Large hiatal hernia/intrathoracic stomach, unchanged. Musculoskeletal: Comminuted transverse fracture of the right proximal humerus at the level of the surgical neck with mild impaction and approximately 2.4 cm of medial displacement of the distal segment (series 6, image 70). The glenoid appears intact. No additional fracture identified. IMPRESSION: 1. No acute traumatic intrathoracic findings. 2. Comminuted, displaced, and impacted fracture of the right proximal humerus at the level of the surgical neck. No additional fracture identified. 3. Enlargement of the main pulmonary artery, which can be seen in the setting of pulmonary arterial hypertension. 4.  Aortic Atherosclerosis (ICD10-I70.0). 5. Additional unchanged ancillary findings, as described above. Electronically Signed   By: Hart Robinsons M.D.   On: 04/29/2023 15:05   DG Shoulder Right  Result Date: 04/29/2023 CLINICAL DATA:  Fall.  Right shoulder pain. EXAM: RIGHT SHOULDER - 2+  VIEW COMPARISON:  Right shoulder radiographs dated May 14, 2021. FINDINGS: Comminuted transverse fracture of the proximal humerus at the level of the surgical neck with mild impaction and approximately 2.4 cm of medial displacement of the distal segment. The humeral head appears aligned with the glenoid with prominent marginal osteophytes. The glenoid appears intact. Acromioclavicular joint is anatomically aligned. Soft tissue  swelling of the right shoulder. IMPRESSION: Comminuted, displaced, and impacted fracture of the right proximal humerus at the level of the surgical neck. Electronically Signed   By: Hart Robinsons M.D.   On: 04/29/2023 14:44   CT Lumbar Spine Wo Contrast  Result Date: 04/29/2023 CLINICAL DATA:  Neck trauma. Mechanical fall. Patient fell yesterday 1 p.m. and has been on the floor since. Was found down this morning. EXAM: CT LUMBAR SPINE WITHOUT CONTRAST TECHNIQUE: Multidetector CT imaging of the lumbar spine was performed without intravenous contrast administration. Multiplanar CT image reconstructions were also generated. RADIATION DOSE REDUCTION: This exam was performed according to the departmental dose-optimization program which includes automated exposure control, adjustment of the mA and/or kV according to patient size and/or use of iterative reconstruction technique. COMPARISON:  Lumbar spine radiographs 04/11/2022. FINDINGS: Segmentation: 5 non rib-bearing lumbar type vertebral bodies are present. The lowest fully formed vertebral body is L5. Alignment: Grade 1 anterolisthesis at L4-5 and L5-S1 is stable. Vertebrae: Chronic endplate sclerotic changes are again noted at L2-3. No acute fractures are present. Endplate changes related to fusion at L4 and L5 are stable. Paraspinal and other soft tissues: Atherosclerotic calcifications are present in the aorta without aneurysm. Cholecystectomy is noted. No solid organ lesions are present. No significant adenopathy is present. Disc levels: L1-2: Broad-based disc protrusion is present. Mild facet hypertrophy is present bilaterally. L2-3: Broad-based disc protrusion is asymmetric to the right. Mild subarticular narrowing is present bilaterally. Moderate right and mild left foraminal stenosis is present. L3-4: A broad-based disc protrusion is present. Facet hypertrophy is worse on the right. Mild subarticular narrowing is present bilaterally. Moderate right and  mild left foraminal stenosis is present. L4-5: Fusion and laminectomy is present. No significant central canal stenosis is present. Severe left and mild right osseous foraminal stenosis is present. L5-S1: Laminectomies and fusion are noted. No residual or recurrent stenosis is present. IMPRESSION: 1. No acute fracture or traumatic subluxation. 2. Stable fusion and laminectomy at L4-5 and L5-S1. 3. Severe left and mild right osseous foraminal stenosis at L4-5. 4. Moderate right and mild left foraminal stenosis at L2-3 and L3-4. 5. Mild subarticular narrowing bilaterally at L2-3 and L3-4. 6.  Aortic Atherosclerosis (ICD10-I70.0). Electronically Signed   By: Marin Roberts M.D.   On: 04/29/2023 13:09   CT Cervical Spine Wo Contrast  Result Date: 04/29/2023 CLINICAL DATA:  Mechanical fall yesterday. Patient was down overnight and found this morning. EXAM: CT CERVICAL SPINE WITHOUT CONTRAST TECHNIQUE: Multidetector CT imaging of the cervical spine was performed without intravenous contrast. Multiplanar CT image reconstructions were also generated. RADIATION DOSE REDUCTION: This exam was performed according to the departmental dose-optimization program which includes automated exposure control, adjustment of the mA and/or kV according to patient size and/or use of iterative reconstruction technique. COMPARISON:  MRI of the cervical spine 01/14/2021. Cervical spine radiographs 04/08/2022. FINDINGS: Alignment: Grade 1 anterolisthesis in C3-4 stable. No other significant listhesis is present. Straightening of the normal cervical lordosis is otherwise stable. Skull base and vertebrae: The craniocervical junction is normal. Vertebral body heights are normal. No acute fractures are present. Soft tissues and spinal  canal: No prevertebral fluid or swelling. No visible canal hematoma. Atherosclerotic calcifications are present at the aortic arch and carotid bifurcations bilaterally without definite stenosis. No aneurysm  or dissection is evident. Disc levels: Multilevel degenerative disc disease is present. Stenosis is again greatest at the left foramina of C5-6 and C6-7. Upper chest: The lung apices are clear. The thoracic inlet is within normal limits. No pneumothorax or significant pleural effusion is present. IMPRESSION: 1. No acute fracture or traumatic subluxation. 2. Multilevel degenerative disc disease. 3. Stenosis is again greatest at the left foramina of C5-6 and C6-7. 4.  Aortic Atherosclerosis (ICD10-I70.0). Electronically Signed   By: Marin Roberts M.D.   On: 04/29/2023 12:07   CT Head Wo Contrast  Result Date: 04/29/2023 CLINICAL DATA:  Head trauma. Mechanical fall yesterday afternoon. Patient has been down since the fall. EXAM: CT HEAD WITHOUT CONTRAST TECHNIQUE: Contiguous axial images were obtained from the base of the skull through the vertex without intravenous contrast. RADIATION DOSE REDUCTION: This exam was performed according to the departmental dose-optimization program which includes automated exposure control, adjustment of the mA and/or kV according to patient size and/or use of iterative reconstruction technique. COMPARISON:  MR head without contrast 01/16/2021. CT head without contrast 01/15/2021. FINDINGS: Brain: No acute infarct, hemorrhage, or mass lesion is present. Advanced atrophy and white matter disease is similar the prior exams. The ventricles are proportionate to the degree of atrophy. No significant extraaxial fluid collection is present. Insert normal brainstem The brainstem and cerebellum are within normal limits. Midline structures are within normal limits. Vascular: Atherosclerotic calcifications are present within the cavernous internal carotid arteries bilaterally. No hyperdense vessel is present. Skull: Calvarium is intact. No focal lytic or blastic lesions are present. No significant extracranial soft tissue lesion is present. Sinuses/Orbits: The paranasal sinuses and  mastoid air cells are clear. The globes and orbits are within normal limits. IMPRESSION: 1. No acute intracranial abnormality or significant interval change. 2. Advanced atrophy and white matter disease is similar the prior exams. This likely reflects the sequela of chronic microvascular ischemia. Electronically Signed   By: Marin Roberts M.D.   On: 04/29/2023 11:56        Scheduled Meds:  acetaminophen  650 mg Oral Q6H   carbidopa-levodopa  1 tablet Oral TID   levothyroxine  88 mcg Oral Q0600   metoprolol tartrate  50 mg Oral BID   Warfarin - Pharmacist Dosing Inpatient   Does not apply q1600   Continuous Infusions:          Glade Lloyd, MD [facility] 05/01/2023, 7:34 AM

## 2023-05-02 ENCOUNTER — Telehealth: Payer: Self-pay | Admitting: Interventional Cardiology

## 2023-05-02 DIAGNOSIS — I482 Chronic atrial fibrillation, unspecified: Secondary | ICD-10-CM | POA: Diagnosis not present

## 2023-05-02 LAB — CBC WITH DIFFERENTIAL/PLATELET
Abs Immature Granulocytes: 0.07 10*3/uL (ref 0.00–0.07)
Basophils Absolute: 0.1 10*3/uL (ref 0.0–0.1)
Basophils Relative: 1 %
Eosinophils Absolute: 0.3 10*3/uL (ref 0.0–0.5)
Eosinophils Relative: 4 %
HCT: 35.1 % — ABNORMAL LOW (ref 36.0–46.0)
Hemoglobin: 12.3 g/dL (ref 12.0–15.0)
Immature Granulocytes: 1 %
Lymphocytes Relative: 18 %
Lymphs Abs: 1.2 10*3/uL (ref 0.7–4.0)
MCH: 32.9 pg (ref 26.0–34.0)
MCHC: 35 g/dL (ref 30.0–36.0)
MCV: 93.9 fL (ref 80.0–100.0)
Monocytes Absolute: 0.7 10*3/uL (ref 0.1–1.0)
Monocytes Relative: 10 %
Neutro Abs: 4.5 10*3/uL (ref 1.7–7.7)
Neutrophils Relative %: 66 %
Platelets: 164 10*3/uL (ref 150–400)
RBC: 3.74 MIL/uL — ABNORMAL LOW (ref 3.87–5.11)
RDW: 13.3 % (ref 11.5–15.5)
WBC: 6.7 10*3/uL (ref 4.0–10.5)
nRBC: 0 % (ref 0.0–0.2)

## 2023-05-02 LAB — BASIC METABOLIC PANEL
Anion gap: 8 (ref 5–15)
BUN: 25 mg/dL — ABNORMAL HIGH (ref 8–23)
CO2: 22 mmol/L (ref 22–32)
Calcium: 8.8 mg/dL — ABNORMAL LOW (ref 8.9–10.3)
Chloride: 107 mmol/L (ref 98–111)
Creatinine, Ser: 1.03 mg/dL — ABNORMAL HIGH (ref 0.44–1.00)
GFR, Estimated: 52 mL/min — ABNORMAL LOW (ref >60)
Glucose, Bld: 166 mg/dL — ABNORMAL HIGH (ref 70–99)
Potassium: 4 mmol/L (ref 3.5–5.1)
Sodium: 137 mmol/L (ref 135–145)

## 2023-05-02 LAB — MAGNESIUM: Magnesium: 1.8 mg/dL (ref 1.7–2.4)

## 2023-05-02 LAB — PROTIME-INR
INR: 3 — ABNORMAL HIGH (ref 0.8–1.2)
Prothrombin Time: 31.3 s — ABNORMAL HIGH (ref 11.4–15.2)

## 2023-05-02 MED ORDER — POLYETHYLENE GLYCOL 3350 17 G PO PACK
17.0000 g | PACK | Freq: Every day | ORAL | 0 refills | Status: AC | PRN
Start: 2023-05-02 — End: ?

## 2023-05-02 MED ORDER — WARFARIN SODIUM 2.5 MG PO TABS
2.5000 mg | ORAL_TABLET | Freq: Once | ORAL | Status: DC
  Filled 2023-05-02: qty 1

## 2023-05-02 MED ORDER — WARFARIN SODIUM 5 MG PO TABS: 5.0000 mg | ORAL_TABLET | Freq: Every evening | ORAL | Status: AC

## 2023-05-02 MED ORDER — SENNOSIDES-DOCUSATE SODIUM 8.6-50 MG PO TABS: 1.0000 | ORAL_TABLET | Freq: Two times a day (BID) | ORAL | 0 refills | Status: AC

## 2023-05-02 MED ORDER — OXYCODONE HCL 5 MG PO TABS: 5.0000 mg | ORAL_TABLET | Freq: Four times a day (QID) | ORAL | 0 refills | Status: AC | PRN

## 2023-05-02 MED ORDER — ACETAMINOPHEN 325 MG PO TABS: 650.0000 mg | ORAL_TABLET | Freq: Four times a day (QID) | ORAL | Status: AC

## 2023-05-02 NOTE — Progress Notes (Signed)
PHARMACY - ANTICOAGULATION CONSULT NOTE  Pharmacy Consult for Coumadin Indication: atrial fibrillation  Allergies  Allergen Reactions   Iodinated Contrast Media Shortness Of Breath and Other (See Comments)    "Allergic," per [facility]- Causes headaches, also   Zosyn [Piperacillin Sod-Tazobactam So] Other (See Comments)    "Allergic," per [facility]   Penicillins Other (See Comments)    Tolerated Zosyn Oct 2018, but "Allergic," per [facility] Did it involve swelling of the face/tongue/throat, SOB, or low BP? Unk Did it involve sudden or severe rash/hives, skin peeling, or any reaction on the inside of your mouth or nose? Unk Did you need to seek medical attention at a hospital or doctor's office? Unk When did it last happen? Unk If all above answers are "NO", may proceed with cephalosporin use.     Patient Measurements: Height: 5\' 9"  (175.3 cm) Weight: 86.9 kg (191 lb 9.3 oz) IBW/kg (Calculated) : 66.2  Vital Signs: Temp: 97.8 F (36.6 C) (11/18 0820) Temp Source: Oral (11/18 0820) BP: 115/76 (11/18 1235) Pulse Rate: 98 (11/18 1235)  Labs: Recent Labs    04/30/23 0439 04/30/23 1028 05/01/23 0554 05/02/23 0756  HGB 13.4  --  12.6 12.3  HCT 39.2  --  36.2 35.1*  PLT 152  --  150 164  LABPROT 22.0*  --  26.2* 31.3*  INR 1.9*  --  2.4* 3.0*  CREATININE 0.97  --  1.25* 1.03*  CKTOTAL  --  304*  --   --     Estimated Creatinine Clearance: 44.4 mL/min (A) (by C-G formula based on SCr of 1.03 mg/dL (H)).   Medical History: Past Medical History:  Diagnosis Date   Anemia    history of   Anxiety    Arthritis    "left knee" (04/05/2017)   Atrial fibrillation (HCC)    Atrial flutter (HCC)    typical appearing   Atrial tachycardia (HCC)    ablated 11/17/10  by JA  from the [facility] of the aorta   CHF (congestive heart failure) (HCC)    Chronic lower back pain    Chronic nausea    Endometrial cancer (HCC)    grade 1   Gallstone pancreatitis    Gastritis    GERD (gastroesophageal reflux  disease)    History of hiatal hernia    HTN (hypertension)    Hyperlipemia    Hypothyroidism    Obesity    Osteoporosis    Toe ulcer (HCC)    left 3rd toe   Vitamin D deficiency     Medications:  No current facility-administered medications on file prior to encounter.   Current Outpatient Medications on File Prior to Encounter  Medication Sig Dispense Refill   acetaminophen (TYLENOL) 650 MG CR tablet Take 650 mg by mouth daily.     carbidopa-levodopa (SINEMET IR) 25-100 MG tablet Take 1 tablet by mouth 3 (three) times daily. 8am/noon/4pm 270 tablet 1   levothyroxine (SYNTHROID) 88 MCG tablet TAKE ONE TABLET BY MOUTH EVERY DAY BEFORE BREAKFAST 90 tablet 0   metoprolol tartrate (LOPRESSOR) 50 MG tablet TAKE ONE TABLET TWICE DAILY WITH A MEAL 180 tablet 2   vitamin B-12 (CYANOCOBALAMIN) 1000 MCG tablet Take 1,000 mcg by mouth daily.     [DISCONTINUED] warfarin (COUMADIN) 5 MG tablet TAKE ONE-HALF TO ONE TABLET DAILY AS DIRECTED BY COUMADIN CLINIC (Patient taking differently: Take 5 mg by mouth at bedtime. Take 1 tablet Mon, Wed, and Fri. Take one-half tablet Sun, Tues, Thurs, and Sat. As DIRECTED  BY COUMADIN CLINIC) 30 tablet 3     Assessment: 89 y.o. female admitted s/p fall, h/o Afib, to continue Coumadin. Current home regimen Coumadin 5 mg MWF and 2.5 mg TTSS  INR today therapeutic at 3.0,  increased from 2.4 yesterday. CBC remains stable. No signs of bleeding per chart. Note boosted dose (5 mg rather than 2.5) received on 11/15.     Goal of Therapy:  INR 2-3 Monitor platelets by anticoagulation protocol: Yes   Plan:  Coumadin 2.5 mg today F/U daily INR  Discharge orders per MD are in for possible discharge to SNF today.   Thank you for allowing pharmacy to be part of this patients care team.  Noah Delaine, RPh Clinical Pharmacist  05/02/2023 3:58 PM  Please check AMION for all [facility] Pharmacy phone numbers After 10:00 PM, call Main Pharmacy [phone]

## 2023-05-02 NOTE — Progress Notes (Addendum)
Physical Therapy Treatment Patient Details Name: Charlotte Castro MRN: [id number] DOB: 27-Jan-1933 Today's Date: 05/02/2023   History of Present Illness Pt is a 89 y.o. F who presents after a fall and was found on the floor after approximately 15 hours. Comminuted, displaced, and impacted fracture of the right proximal humerus at the level of the surgical neck. Pt c/o RLE pain however negative for fx per CT 11/15. PMH: hypertension, hyperlipidemia, atrial fibrillation/flutter on Coumadin, CHF, hypothyroidism, anxiety, osteoarthritis, chronic nausea, GERD, recurrent UTIs.    PT Comments  Pt received in supine, agreeable to therapy session, pt anxious at prospect of mobility due to fear/anticipation of pain, RN notified of pt pain score and PTA gave her ice to R shoulder for comfort. Pt needing up to modA +1 to transfer to long sit then EOB, and up to +2 maxA for return to supine due to pt fear of falls/pain. Pt given instruction on technique for lateral squat/scoot along EOB via visual/verbal demo however pt unable to follow cues and needed totalA for single scoot attempt at EOB. Pt would benefit from vestibular PT assessment next session as she is c/o vertigo but BP stable with sit to supine positional change. Patient will benefit from continued inpatient follow up therapy, <3 hours/day.    If plan is discharge home, recommend the following: Two people to help with walking and/or transfers;Two people to help with bathing/dressing/bathroom   Can travel by private vehicle     No  Equipment Recommendations  Other (comment)    Recommendations for Other Services       Precautions / Restrictions Precautions Precautions: Fall Precaution Comments: sx of vertigo with rolling and sitting EOB Required Braces or Orthoses: Sling Restrictions Weight Bearing Restrictions: Yes RUE Weight Bearing: Non weight bearing     Mobility  Bed Mobility Overal bed mobility: Needs Assistance Bed Mobility:  Rolling, Supine to Sit, Sit to Supine Rolling: Mod assist, +2 for safety/equipment   Supine to sit: Mod assist Sit to supine: Max assist, +2 for physical assistance, +2 for safety/equipment   General bed mobility comments: supine to long sit via LUE HHA and trunk support with modA and pivoting hips to EOB with multimodal cues/some physical assist. Increased assist for return to supine due to pt anxiety/fear of pain. Pt rolled to L/R sides after return to supine for removal of soiled pads/hygiene assist provided by NT    Transfers Overall transfer level: Needs assistance                 General transfer comment: Attempted to have pt perform seated lateral scoot toward her R side using only LUE and BLE, but pt panics and screams prior to scoot attempt and unsafe to continue efforts. Currently anticipate pt would need +2 for transfers but defer today due to pt needing pain meds and too anxious to safely follow cues for transfers due to anticipation of pain/fear of falls. NT called to room to assist with return transfer to supine for pt safety.    Ambulation/Gait               General Gait Details: defer, too much pain for pt to stand   Stairs             Wheelchair Mobility     Tilt Bed    Modified Rankin (Stroke Patients Only)       Balance Overall balance assessment: Needs assistance Sitting-balance support: Feet supported Sitting balance-[name]: Fair Sitting balance -  Comments: mostly Supervision for static sitting, pt intermittently using LUE for support.       Standing balance comment: defer, pt too anxious and pain too severe to attempt                            Cognition Arousal: Alert Behavior During Therapy: Anxious, Lability Overall Cognitive Status: No family/caregiver present to determine baseline cognitive functioning                                 General Comments: Pt A&Ox4, tangential, anxious regarding  prospect of mobility, even lateral scooting EOB or bed mobility. Tends to forget about RUE restrictions when she panics prior to cues for move and pt screams prior to moving/in anticipation of pain. Pt verbalized symptoms of vertigo with rolling and transfer to EOB, unclear if she has history of vertigo due to pt tangential report/possible mild cognitive impairment.        Exercises Other Exercises Other Exercises: seated BLE AROM: hip flexion, LAQ x10 reps ea Other Exercises: supine BLE AAROM: heel slides, hip abduction, ankle pumps (AROM) x10 reps ea Other Exercises: RLE heel cord stretch x30 sec in pain-free range    General Comments General comments (skin integrity, edema, etc.): incontinence, no purewick ordered so NT provided hygiene assist at end of session and pad changed under her in supine. BP stable sit to supine but did not check supine prior to pt sitting EOB, pt having symptoms of vertigo would benefit from vestibular PT assessment at next facility.  05/02/23 1225  Vital Signs  Patient Position (if appropriate) Orthostatic Vitals  Orthostatic Sitting  BP- Sitting 107/71  Pulse- Sitting 100   Orthostatic Lying   BP- Lying 115/76  Pulse- Lying 98       Pertinent Vitals/Pain Pain Assessment Pain Assessment: PAINAD Breathing: occasional labored breathing, short period of hyperventilation Negative Vocalization: repeated troubled calling out, loud moaning/groaning, crying Facial Expression: facial grimacing Body Language: tense, distressed pacing, fidgeting Consolability: distracted or reassured by voice/touch PAINAD Score: 7 Pain Location: RUE Pain Descriptors / Indicators: Grimacing, Guarding, Moaning Pain Intervention(s): Limited activity within patient's tolerance, Monitored during session, Repositioned, Patient requesting pain meds-RN notified, Ice applied    Home Living                          Prior Function            PT Goals (current goals can  now be found in the care plan section) Acute Rehab PT Goals Patient Stated Goal: less pain PT Goal Formulation: With patient Time For Goal Achievement: 05/14/23 Progress towards PT goals: Progressing toward goals    Frequency    Min 1X/week      PT Plan      Co-evaluation              AM-PAC PT "6 Clicks" Mobility   Outcome Measure  Help needed turning from your back to your side while in a flat bed without using bedrails?: A Lot Help needed moving from lying on your back to sitting on the side of a flat bed without using bedrails?: A Lot Help needed moving to and from a bed to a chair (including a wheelchair)?: Total Help needed standing up from a chair using your arms (e.g., wheelchair or bedside chair)?: Total Help needed to  walk in hospital room?: Total Help needed climbing 3-5 steps with a railing? : Total 6 Click Score: 8    End of Session Equipment Utilized During Treatment: Other (comment) (RUE sling in place throughout) Activity Tolerance: Patient limited by pain;Other (comment) (c/o vertigo symptoms with bed mobility and sitting EOB) Patient left: in bed;with call bell/phone within reach;with bed alarm set;Other (comment);with SCD's reapplied (HOB >30*) Nurse Communication: Mobility status;Need for lift equipment;Other (comment);Patient requests pain meds (Stedy vs hoyer for OOB to chair or BSC transfers) PT Visit Diagnosis: History of falling (Z91.81);Muscle weakness (generalized) (M62.81);Pain Pain - Right/Left: Right Pain - part of body: Arm     Time: 4098-1191 PT Time Calculation (min) (ACUTE ONLY): 31 min  Charges:    $Therapeutic Exercise: 8-22 mins $Therapeutic Activity: 8-22 mins PT General Charges $$ ACUTE PT VISIT: 1 Visit                     Shirlyn Savin P., PTA Acute Rehabilitation Services Secure Chat Preferred 9a-5:30pm Office: [phone]    Dorathy Kinsman [facility] 05/02/2023, 1:05 PM

## 2023-05-02 NOTE — Telephone Encounter (Signed)
Daughter (Tammie) called to report patient will be going to rehab.  Daughter also noted patient's phone number is a true landline and cannot accept texts.

## 2023-05-02 NOTE — Plan of Care (Signed)
  Problem: Education: Goal: Knowledge of General Education information will improve Description: Including pain rating scale, medication(s)/side effects and non-pharmacologic comfort measures Outcome: Progressing   Problem: Clinical Measurements: Goal: Ability to maintain clinical measurements within normal limits will improve Outcome: Progressing   Problem: Nutrition: Goal: Adequate nutrition will be maintained Outcome: Progressing   Problem: Coping: Goal: Level of anxiety will decrease Outcome: Progressing   Problem: Pain Management: Goal: General experience of comfort will improve Outcome: Progressing   Problem: Safety: Goal: Ability to remain free from injury will improve Outcome: Progressing

## 2023-05-02 NOTE — Progress Notes (Signed)
PROGRESS NOTE    Charlotte Castro  DGL:[id number] DOB: [birth year] DOA: 04/29/2023 PCP: Philip Aspen, Limmie Patricia, MD   Brief Narrative:  89 year old female with past medical history of HTN, arthritis, A-fib, chronic diastolic heart failure, hypothyroidism, tremors with possible recent diagnosis of parkinsonism for which she was started on carbidopa levodopa by neurology as an outpatient and recurrent UTIs presented with a fall and was found to have comminuted fracture of right proximal humerus.  ED provider contacted orthopedics who recommended outpatient follow-up with orthopedics.  Patient also had A-fib with RVR.  PT recommended SNF placement.  Assessment & Plan:   Acute comminuted fracture of right proximal humerus following a fall -ED provider contacted orthopedics who recommended outpatient follow-up with orthopedics.  Right upper extremity splint placed in the ED. -Fall precautions.  Pain management. -PT/OT recommended SNF placement.  TOC consulted.  Permanent A-fib with RVR -Currently rate controlled.  Continue metoprolol.  Continue Coumadin.  Coumadin to be dosed as per pharmacy based on INR.  Monitor INR.  Leukocytosis -Most likely reactive.  Improving.  Labs pending today.  Elevated CK -CK elevated only minimally at 707 on presentation.  Treated with IV fluids.  Encourage oral intake.  Off IV fluids.  Essential hypertension -Continue metoprolol  History of tremors with?  New diagnosis of parkinsonism -Continue levodopa carbidopa.  Outpatient follow-up with neurology.  Hypothyroidism -Continue levothyroxine   DVT prophylaxis: Coumadin Code Status: Full Family Communication: Daughter on phone on 04/30/2023 Disposition Plan: Status is: inpatient because: Severity of illness.  Need for possible SNF placement  Consultants: None  Procedures: None  Antimicrobials: None   Subjective: Patient seen and examined at bedside.  Still having intermittent left upper  extremity pain.  No shortness of breath, chest pain, fever reported.   Objective: Vitals:   05/01/23 2118 05/01/23 2121 05/02/23 0319 05/02/23 0820  BP: (!) 112/57 (!) 112/57 (!) 162/96 (!) 128/59  Pulse:  99 96 95  Resp:   13   Temp:   98.2 F (36.8 C) 97.8 F (36.6 C)  TempSrc:   Oral Oral  SpO2:   97% 98%  Weight:   86.9 kg   Height:        Intake/Output Summary (Last 24 hours) at 05/02/2023 0822 Last data filed at 05/02/2023 0326 Gross per 24 hour  Intake --  Output 1100 ml  Net -1100 ml   Filed Weights   04/30/23 0528 05/01/23 0500 05/02/23 0319  Weight: 88.5 kg 88.2 kg 86.9 kg    Examination:  General: On room air.  No acute distress.  Elderly female lying in bed.  Slow to respond.  Poor historian.  Flat affect. respiratory: Bilateral decreased breath sounds at bases with scattered crackles CVS: Currently rate controlled; S1-S2 heard  abdominal: Soft, nontender, distended by, no organomegaly; normal bowel sounds are heard  extremities: Trace lower extremity edema; no clubbing.  Right shoulder swelling and tenderness present     Data Reviewed: I have personally reviewed following labs and imaging studies  CBC: Recent Labs  Lab 04/29/23 1129 04/30/23 0439 05/01/23 0554  WBC 17.3* 14.8* 11.6*  NEUTROABS 15.0*  --  8.9*  HGB 15.0 13.4 12.6  HCT 42.0 39.2 36.2  MCV 92.3 94.7 94.3  PLT 158 152 150   Basic Metabolic Panel: Recent Labs  Lab 04/29/23 1129 04/30/23 0439 05/01/23 0554  NA 140 141 139  K 4.0 3.8 4.9  CL 107 110 108  CO2 21* 22 25  GLUCOSE 133* 84  113*  BUN 25* 27* 35*  CREATININE 1.14* 0.97 1.25*  CALCIUM 9.4 9.0 8.9  MG  --  1.8 1.8   GFR: Estimated Creatinine Clearance: 36.6 mL/min (A) (by C-G formula based on SCr of 1.25 mg/dL (H)). Liver Function Tests: Recent Labs  Lab 04/29/23 1129  AST 39  ALT 22  ALKPHOS 67  BILITOT 2.6*  PROT 6.5  ALBUMIN 3.6   No results for input(s): "LIPASE", "AMYLASE" in the last 168 hours. No  results for input(s): "AMMONIA" in the last 168 hours. Coagulation Profile: Recent Labs  Lab 04/29/23 1129 04/30/23 0439 05/01/23 0554  INR 1.7* 1.9* 2.4*   Cardiac Enzymes: Recent Labs  Lab 04/29/23 1129 04/30/23 1028  CKTOTAL 707* 304*   BNP (last 3 results) No results for input(s): "PROBNP" in the last 8760 hours. HbA1C: No results for input(s): "HGBA1C" in the last 72 hours. CBG: No results for input(s): "GLUCAP" in the last 168 hours. Lipid Profile: No results for input(s): "CHOL", "HDL", "LDLCALC", "TRIG", "CHOLHDL", "LDLDIRECT" in the last 72 hours. Thyroid Function Tests: No results for input(s): "TSH", "T4TOTAL", "FREET4", "T3FREE", "THYROIDAB" in the last 72 hours. Anemia Panel: No results for input(s): "VITAMINB12", "FOLATE", "FERRITIN", "TIBC", "IRON", "RETICCTPCT" in the last 72 hours. Sepsis Labs: No results for input(s): "PROCALCITON", "LATICACIDVEN" in the last 168 hours.  No results found for this or any previous visit (from the past 240 hour(s)).       Radiology Studies: No results found.      Scheduled Meds:  acetaminophen  650 mg Oral Q6H   carbidopa-levodopa  1 tablet Oral TID   levothyroxine  88 mcg Oral Q0600   metoprolol tartrate  50 mg Oral BID   Warfarin - Pharmacist Dosing Inpatient   Does not apply q1600   Continuous Infusions:          Glade Lloyd, MD [facility] 05/02/2023, 8:22 AM

## 2023-05-02 NOTE — TOC Transition Note (Signed)
Transition of Care [facility]) - CM/SW Discharge Note   Patient Details  Name: Charlotte Castro MRN: [id number] Date of Birth: 28-Sep-1932  [facility] CM/SW Contact:  Lorri Frederick, LCSW Phone Number: 05/02/2023, 1:36 PM   Clinical Narrative:   Pt discharging to [facility].  RN call report to [phone].    Final next level of care: Skilled Nursing Facility Barriers to Discharge: Barriers Resolved   Patient Goals and CMS Choice CMS Medicare.gov Compare Post Acute Care list provided to:: Patient Represenative (must comment) Choice offered to / list presented to : Adult Children  Discharge Placement                Patient chooses bed at: [facility], [place] Patient to be transferred to facility by: ptar Name of family member notified: daughter Tammie Patient and family notified of of transfer: 05/02/23  Discharge Plan and Services Additional resources added to the After Visit Summary for       Post Acute Care Choice: Skilled Nursing Facility                               Social Determinants of Health (SDOH) Interventions SDOH Screenings   Food Insecurity: No Food Insecurity (04/30/2023)  Housing: Low Risk  (04/30/2023)  Transportation Needs: No Transportation Needs (04/30/2023)  Utilities: Not At Risk (04/30/2023)  Depression (PHQ2-9): Medium Risk (03/02/2023)  Tobacco Use: Low Risk  (04/29/2023)     Readmission Risk Interventions     No data to display

## 2023-05-02 NOTE — Discharge Summary (Signed)
Physician Discharge Summary  Charlotte Castro ZOX:[id number] DOB: Dec 12, 1932 DOA: 04/29/2023  PCP: Philip Aspen, Limmie Patricia, MD  Admit date: 04/29/2023 Discharge date: 05/02/2023  Admitted From: Home Disposition: SNF  Recommendations for Outpatient Follow-up:  Follow up with PCP in 1 week with repeat CBC/BMP/INR Outpatient follow-up with orthopedics Follow up in ED if symptoms worsen or new appear   Home Health: No Equipment/Devices: None  Discharge Condition: Stable CODE STATUS: Full Diet recommendation: Heart healthy  Brief/Interim Summary: 89 year old female with past medical history of HTN, arthritis, A-fib, chronic diastolic heart failure, hypothyroidism, tremors with possible recent diagnosis of parkinsonism for which she was started on carbidopa levodopa by neurology as an outpatient and recurrent UTIs presented with a fall and was found to have comminuted fracture of right proximal humerus.  ED provider contacted orthopedics who recommended outpatient follow-up with orthopedics.  Patient also had A-fib with RVR.  PT recommended SNF placement.  She will be discharged to SNF once bed is available  Discharge Diagnoses:   Acute comminuted fracture of right proximal humerus following a fall -ED provider contacted orthopedics who recommended outpatient follow-up with orthopedics.  Right upper extremity splint placed in the ED. -Fall precautions.  Pain management. -PT/OT recommended SNF placement.  Currently medically stable for discharge.  Discharge to SNF once bed is available.   Permanent A-fib with RVR -Currently rate controlled.  Continue metoprolol.  Continue Coumadin.  Outpatient follow-up with cardiology.  Outpatient follow-up of INR  Leukocytosis -Most likely reactive.  Improving.  Resolved   Elevated CK -CK elevated only minimally at 707 on presentation.  Treated with IV fluids.  Encourage oral intake.  Off IV fluids.   Essential hypertension -Continue  metoprolol   History of tremors with?  New diagnosis of parkinsonism -Continue levodopa carbidopa.  Outpatient follow-up with neurology.   Hypothyroidism -Continue levothyroxine   Discharge Instructions  Discharge Instructions     Diet - low sodium heart healthy   Complete by: As directed    Increase activity slowly   Complete by: As directed    No wound care   Complete by: As directed       Allergies as of 05/02/2023       Reactions   Iodinated Contrast Media Shortness Of Breath, Other (See Comments)   "Allergic," per [facility]- Causes headaches, also   Zosyn [piperacillin Sod-tazobactam So] Other (See Comments)   "Allergic," per [facility]   Penicillins Other (See Comments)   Tolerated Zosyn Oct 2018, but "Allergic," per [facility] Did it involve swelling of the face/tongue/throat, SOB, or low BP? Unk Did it involve sudden or severe rash/hives, skin peeling, or any reaction on the inside of your mouth or nose? Unk Did you need to seek medical attention at a hospital or doctor's office? Unk When did it last happen? Unk If all above answers are "NO", may proceed with cephalosporin use.        Medication List     STOP taking these medications    acetaminophen 650 MG CR tablet Commonly known as: TYLENOL Replaced by: acetaminophen 325 MG tablet       TAKE these medications    acetaminophen 325 MG tablet Commonly known as: TYLENOL Take 2 tablets (650 mg total) by mouth every 6 (six) hours. Replaces: acetaminophen 650 MG CR tablet   carbidopa-levodopa 25-100 MG tablet Commonly known as: SINEMET IR Take 1 tablet by mouth 3 (three) times daily. 8am/noon/4pm   cyanocobalamin 1000 MCG tablet Commonly known as: VITAMIN B12 Take  1,000 mcg by mouth daily.   levothyroxine 88 MCG tablet Commonly known as: SYNTHROID TAKE ONE TABLET BY MOUTH EVERY DAY BEFORE BREAKFAST   metoprolol tartrate 50 MG tablet Commonly known as: LOPRESSOR TAKE ONE TABLET TWICE DAILY WITH A MEAL    oxyCODONE 5 MG immediate release tablet Commonly known as: Oxy IR/ROXICODONE Take 1 tablet (5 mg total) by mouth every 6 (six) hours as needed for moderate pain (pain score 4-6).   polyethylene glycol 17 g packet Commonly known as: MiraLax Take 17 g by mouth daily as needed for moderate constipation.   senna-docusate 8.6-50 MG tablet Commonly known as: Senokot-S Take 1 tablet by mouth 2 (two) times daily.   warfarin 5 MG tablet Commonly known as: COUMADIN Take as directed. If you are unsure how to take this medication, talk to your nurse or doctor. Original instructions: Take 1 tablet (5 mg total) by mouth at bedtime. Take 1 tablet Mon, Wed, and Fri. Take one-half tablet Sun, Tues, Thurs, and Sat. As DIRECTED BY COUMADIN CLINIC        Follow-up Information     Philip Aspen, Limmie Patricia, MD .   Specialty: Internal Medicine Why: As needed Contact information: [address] Christena Flake [facility] [place] [address] [phone]         [facility] Emergency Department at [facility] .   Specialty: Emergency Medicine Why: As needed Contact information: [address] [place] [address] [phone]        London Sheer, MD. Call .   Specialty: Orthopedic Surgery Contact information: [address] [place] [address] [phone]                Allergies  Allergen Reactions   Iodinated Contrast Media Shortness Of Breath and Other (See Comments)    "Allergic," per [facility]- Causes headaches, also   Zosyn [Piperacillin Sod-Tazobactam So] Other (See Comments)    "Allergic," per [facility]   Penicillins Other (See Comments)    Tolerated Zosyn Oct 2018, but "Allergic," per [facility] Did it involve swelling of the face/tongue/throat, SOB, or low BP? Unk Did it involve sudden or severe rash/hives, skin peeling, or any reaction on the inside of your mouth or nose? Unk Did you need to seek medical attention at a hospital or doctor's office? Unk When did  it last happen? Unk If all above answers are "NO", may proceed with cephalosporin use.     Consultations: None   Procedures/Studies: DG Ankle Complete Right  Result Date: 04/29/2023 CLINICAL DATA:  Fall yesterday with right ankle pain, initial encounter EXAM: RIGHT ANKLE - COMPLETE 3+ VIEW COMPARISON:  None Available. FINDINGS: There is no evidence of fracture, dislocation, or joint effusion. There is no evidence of arthropathy or other focal bone abnormality. Soft tissues are unremarkable. IMPRESSION: No acute abnormality noted. Electronically Signed   By: Alcide Clever M.D.   On: 04/29/2023 21:13   DG Femur Min 2 Views Right  Result Date: 04/29/2023 CLINICAL DATA:  Fall yesterday with persistent right leg pain, initial encounter EXAM: RIGHT FEMUR 2 VIEWS COMPARISON:  None Available. FINDINGS: Degenerative changes of the right hip joint are noted. No acute fracture or dislocation is seen. Degenerative changes about the knee joint are noted as well. No soft tissue abnormality is seen. IMPRESSION: Degenerative change without acute abnormality. Electronically Signed   By: Alcide Clever M.D.   On: 04/29/2023 21:07   DG Tibia/Fibula Right  Result Date: 04/29/2023 CLINICAL DATA:  Fall yesterday with  right lower leg pain, initial encounter EXAM: RIGHT TIBIA AND FIBULA - 2 VIEW COMPARISON:  None Available. FINDINGS: There is no evidence of fracture or other focal bone lesions. Soft tissues are unremarkable. IMPRESSION: No acute abnormality noted. Electronically Signed   By: Alcide Clever M.D.   On: 04/29/2023 21:07   DG Knee 2 Views Right  Result Date: 04/29/2023 CLINICAL DATA:  Recent fall yesterday with right knee pain, initial encounter EXAM: RIGHT KNEE - 2 VIEW COMPARISON:  None Available. FINDINGS: Tricompartmental degenerative changes are noted. No joint effusion is seen. No acute fracture or dislocation is noted. IMPRESSION: Degenerative change without acute abnormality. Electronically  Signed   By: Alcide Clever M.D.   On: 04/29/2023 21:06   CT Hip Right Wo Contrast  Result Date: 04/29/2023 CLINICAL DATA:  Fall yesterday with right hip pain, initial encounter EXAM: CT OF THE RIGHT HIP WITHOUT CONTRAST TECHNIQUE: Multidetector CT imaging of the right hip was performed according to the standard protocol. Multiplanar CT image reconstructions were also generated. RADIATION DOSE REDUCTION: This exam was performed according to the departmental dose-optimization program which includes automated exposure control, adjustment of the mA and/or kV according to patient size and/or use of iterative reconstruction technique. COMPARISON:  Plain film from earlier in the same day. FINDINGS: Bones/Joint/Cartilage Degenerative changes of the right sacroiliac joint and lower lumbar spine are noted. Prior cage fusion at L4-5 and L5-S1 is noted. Degenerative changes of the right hip are seen with subchondral sclerosis and cyst formation. No acute fracture or dislocation is noted. Mild osteopenia is seen. No joint effusion is noted. Ligaments Suboptimally assessed by CT. Muscles and Tendons Surrounding musculature appears within normal limits. Soft tissues Surrounding soft tissue structures show no acute abnormality. IMPRESSION: Degenerative changes without acute fracture. No soft tissue abnormality is noted. Electronically Signed   By: Alcide Clever M.D.   On: 04/29/2023 21:05   DG Pelvis Portable  Result Date: 04/29/2023 CLINICAL DATA:  ? Hip fracture. EXAM: PORTABLE PELVIS 1 VIEWS COMPARISON:  None Available. FINDINGS: There is bilateral hip degenerative change with joint space narrowing and small osteophytes. Pelvic ring is intact. No osteolytic or osteoblastic lesions. Can not exclude right acetabular fracture. Consider CT correlation. IMPRESSION: Bilateral hip degenerative changes. Possible right acetabular fracture. Consider CT correlation. Electronically Signed   By: Layla Maw M.D.   On: 04/29/2023  18:18   CT Chest Wo Contrast  Result Date: 04/29/2023 CLINICAL DATA:  Fall. EXAM: CT CHEST WITHOUT CONTRAST TECHNIQUE: Multidetector CT imaging of the chest was performed following the standard protocol without IV contrast. RADIATION DOSE REDUCTION: This exam was performed according to the departmental dose-optimization program which includes automated exposure control, adjustment of the mA and/or kV according to patient size and/or use of iterative reconstruction technique. COMPARISON:  CT chest dated Oct 26, 2017. FINDINGS: Cardiovascular: Thoracic aorta is normal in caliber. Atherosclerotic calcifications of the thoracic aorta and arch branch vessels. Multivessel coronary artery calcifications. Similar enlargement of the left atrium. No pericardial effusion. The main pulmonary trunk is dilated measuring up to 3.4 cm. Mediastinum/Nodes: No enlarged mediastinal or axillary lymph nodes. Large hiatal hernia with intrathoracic stomach again noted. The trachea is unremarkable. Lungs/Pleura: Biapical pleural-parenchymal scarring. Stable 6 mm nodule in the left lower lobe (series 5, image 116) and 4 mm nodule in the right upper lobe (series 5, image 75), essentially unchanged since 2017, favoring a benign etiology. Calcified granulomas. Mild right basilar atelectasis. No pleural effusion. No pneumothorax. Upper Abdomen: No acute  abnormality. Large hiatal hernia/intrathoracic stomach, unchanged. Musculoskeletal: Comminuted transverse fracture of the right proximal humerus at the level of the surgical neck with mild impaction and approximately 2.4 cm of medial displacement of the distal segment (series 6, image 70). The glenoid appears intact. No additional fracture identified. IMPRESSION: 1. No acute traumatic intrathoracic findings. 2. Comminuted, displaced, and impacted fracture of the right proximal humerus at the level of the surgical neck. No additional fracture identified. 3. Enlargement of the main pulmonary  artery, which can be seen in the setting of pulmonary arterial hypertension. 4.  Aortic Atherosclerosis (ICD10-I70.0). 5. Additional unchanged ancillary findings, as described above. Electronically Signed   By: Hart Robinsons M.D.   On: 04/29/2023 15:05   DG Shoulder Right  Result Date: 04/29/2023 CLINICAL DATA:  Fall.  Right shoulder pain. EXAM: RIGHT SHOULDER - 2+ VIEW COMPARISON:  Right shoulder radiographs dated May 14, 2021. FINDINGS: Comminuted transverse fracture of the proximal humerus at the level of the surgical neck with mild impaction and approximately 2.4 cm of medial displacement of the distal segment. The humeral head appears aligned with the glenoid with prominent marginal osteophytes. The glenoid appears intact. Acromioclavicular joint is anatomically aligned. Soft tissue swelling of the right shoulder. IMPRESSION: Comminuted, displaced, and impacted fracture of the right proximal humerus at the level of the surgical neck. Electronically Signed   By: Hart Robinsons M.D.   On: 04/29/2023 14:44   CT Lumbar Spine Wo Contrast  Result Date: 04/29/2023 CLINICAL DATA:  Neck trauma. Mechanical fall. Patient fell yesterday 1 p.m. and has been on the floor since. Was found down this morning. EXAM: CT LUMBAR SPINE WITHOUT CONTRAST TECHNIQUE: Multidetector CT imaging of the lumbar spine was performed without intravenous contrast administration. Multiplanar CT image reconstructions were also generated. RADIATION DOSE REDUCTION: This exam was performed according to the departmental dose-optimization program which includes automated exposure control, adjustment of the mA and/or kV according to patient size and/or use of iterative reconstruction technique. COMPARISON:  Lumbar spine radiographs 04/11/2022. FINDINGS: Segmentation: 5 non rib-bearing lumbar type vertebral bodies are present. The lowest fully formed vertebral body is L5. Alignment: Grade 1 anterolisthesis at L4-5 and L5-S1 is stable.  Vertebrae: Chronic endplate sclerotic changes are again noted at L2-3. No acute fractures are present. Endplate changes related to fusion at L4 and L5 are stable. Paraspinal and other soft tissues: Atherosclerotic calcifications are present in the aorta without aneurysm. Cholecystectomy is noted. No solid organ lesions are present. No significant adenopathy is present. Disc levels: L1-2: Broad-based disc protrusion is present. Mild facet hypertrophy is present bilaterally. L2-3: Broad-based disc protrusion is asymmetric to the right. Mild subarticular narrowing is present bilaterally. Moderate right and mild left foraminal stenosis is present. L3-4: A broad-based disc protrusion is present. Facet hypertrophy is worse on the right. Mild subarticular narrowing is present bilaterally. Moderate right and mild left foraminal stenosis is present. L4-5: Fusion and laminectomy is present. No significant central canal stenosis is present. Severe left and mild right osseous foraminal stenosis is present. L5-S1: Laminectomies and fusion are noted. No residual or recurrent stenosis is present. IMPRESSION: 1. No acute fracture or traumatic subluxation. 2. Stable fusion and laminectomy at L4-5 and L5-S1. 3. Severe left and mild right osseous foraminal stenosis at L4-5. 4. Moderate right and mild left foraminal stenosis at L2-3 and L3-4. 5. Mild subarticular narrowing bilaterally at L2-3 and L3-4. 6.  Aortic Atherosclerosis (ICD10-I70.0). Electronically Signed   By: Marin Roberts M.D.   On: 04/29/2023  13:09   CT Cervical Spine Wo Contrast  Result Date: 04/29/2023 CLINICAL DATA:  Mechanical fall yesterday. Patient was down overnight and found this morning. EXAM: CT CERVICAL SPINE WITHOUT CONTRAST TECHNIQUE: Multidetector CT imaging of the cervical spine was performed without intravenous contrast. Multiplanar CT image reconstructions were also generated. RADIATION DOSE REDUCTION: This exam was performed according to the  departmental dose-optimization program which includes automated exposure control, adjustment of the mA and/or kV according to patient size and/or use of iterative reconstruction technique. COMPARISON:  MRI of the cervical spine 01/14/2021. Cervical spine radiographs 04/08/2022. FINDINGS: Alignment: Grade 1 anterolisthesis in C3-4 stable. No other significant listhesis is present. Straightening of the normal cervical lordosis is otherwise stable. Skull base and vertebrae: The craniocervical junction is normal. Vertebral body heights are normal. No acute fractures are present. Soft tissues and spinal canal: No prevertebral fluid or swelling. No visible canal hematoma. Atherosclerotic calcifications are present at the aortic arch and carotid bifurcations bilaterally without definite stenosis. No aneurysm or dissection is evident. Disc levels: Multilevel degenerative disc disease is present. Stenosis is again greatest at the left foramina of C5-6 and C6-7. Upper chest: The lung apices are clear. The thoracic inlet is within normal limits. No pneumothorax or significant pleural effusion is present. IMPRESSION: 1. No acute fracture or traumatic subluxation. 2. Multilevel degenerative disc disease. 3. Stenosis is again greatest at the left foramina of C5-6 and C6-7. 4.  Aortic Atherosclerosis (ICD10-I70.0). Electronically Signed   By: Marin Roberts M.D.   On: 04/29/2023 12:07   CT Head Wo Contrast  Result Date: 04/29/2023 CLINICAL DATA:  Head trauma. Mechanical fall yesterday afternoon. Patient has been down since the fall. EXAM: CT HEAD WITHOUT CONTRAST TECHNIQUE: Contiguous axial images were obtained from the base of the skull through the vertex without intravenous contrast. RADIATION DOSE REDUCTION: This exam was performed according to the departmental dose-optimization program which includes automated exposure control, adjustment of the mA and/or kV according to patient size and/or use of iterative  reconstruction technique. COMPARISON:  MR head without contrast 01/16/2021. CT head without contrast 01/15/2021. FINDINGS: Brain: No acute infarct, hemorrhage, or mass lesion is present. Advanced atrophy and white matter disease is similar the prior exams. The ventricles are proportionate to the degree of atrophy. No significant extraaxial fluid collection is present. Insert normal brainstem The brainstem and cerebellum are within normal limits. Midline structures are within normal limits. Vascular: Atherosclerotic calcifications are present within the cavernous internal carotid arteries bilaterally. No hyperdense vessel is present. Skull: Calvarium is intact. No focal lytic or blastic lesions are present. No significant extracranial soft tissue lesion is present. Sinuses/Orbits: The paranasal sinuses and mastoid air cells are clear. The globes and orbits are within normal limits. IMPRESSION: 1. No acute intracranial abnormality or significant interval change. 2. Advanced atrophy and white matter disease is similar the prior exams. This likely reflects the sequela of chronic microvascular ischemia. Electronically Signed   By: Marin Roberts M.D.   On: 04/29/2023 11:56      Subjective: Patient seen and examined at bedside.  Still having intermittent left upper extremity pain.  No shortness of breath, chest pain, fever reported.   Discharge Exam: Vitals:   05/02/23 0319 05/02/23 0820  BP: (!) 162/96 (!) 128/59  Pulse: 96 95  Resp: 13   Temp: 98.2 F (36.8 C) 97.8 F (36.6 C)  SpO2: 97% 98%    General: On room air.  No acute distress.  Elderly female lying in bed.  Slow to respond.  Poor historian.  Flat affect. respiratory: Bilateral decreased breath sounds at bases with scattered crackles CVS: Currently rate controlled; S1-S2 heard  abdominal: Soft, nontender, distended by, no organomegaly; normal bowel sounds are heard  extremities: Trace lower extremity edema; no clubbing.  Right  shoulder swelling and tenderness present    The results of significant diagnostics from this hospitalization (including imaging, microbiology, ancillary and laboratory) are listed below for reference.     Microbiology: No results found for this or any previous visit (from the past 240 hour(s)).   Labs: BNP (last 3 results) No results for input(s): "BNP" in the last 8760 hours. Basic Metabolic Panel: Recent Labs  Lab 04/29/23 1129 04/30/23 0439 05/01/23 0554 05/02/23 0756  NA 140 141 139 137  K 4.0 3.8 4.9 4.0  CL 107 110 108 107  CO2 21* 22 25 22   GLUCOSE 133* 84 113* 166*  BUN 25* 27* 35* 25*  CREATININE 1.14* 0.97 1.25* 1.03*  CALCIUM 9.4 9.0 8.9 8.8*  MG  --  1.8 1.8 1.8   Liver Function Tests: Recent Labs  Lab 04/29/23 1129  AST 39  ALT 22  ALKPHOS 67  BILITOT 2.6*  PROT 6.5  ALBUMIN 3.6   No results for input(s): "LIPASE", "AMYLASE" in the last 168 hours. No results for input(s): "AMMONIA" in the last 168 hours. CBC: Recent Labs  Lab 04/29/23 1129 04/30/23 0439 05/01/23 0554 05/02/23 0756  WBC 17.3* 14.8* 11.6* 6.7  NEUTROABS 15.0*  --  8.9* 4.5  HGB 15.0 13.4 12.6 12.3  HCT 42.0 39.2 36.2 35.1*  MCV 92.3 94.7 94.3 93.9  PLT 158 152 150 164   Cardiac Enzymes: Recent Labs  Lab 04/29/23 1129 04/30/23 1028  CKTOTAL 707* 304*   BNP: Invalid input(s): "POCBNP" CBG: No results for input(s): "GLUCAP" in the last 168 hours. D-Dimer No results for input(s): "DDIMER" in the last 72 hours. Hgb A1c No results for input(s): "HGBA1C" in the last 72 hours. Lipid Profile No results for input(s): "CHOL", "HDL", "LDLCALC", "TRIG", "CHOLHDL", "LDLDIRECT" in the last 72 hours. Thyroid function studies No results for input(s): "TSH", "T4TOTAL", "T3FREE", "THYROIDAB" in the last 72 hours.  Invalid input(s): "FREET3" Anemia work up No results for input(s): "VITAMINB12", "FOLATE", "FERRITIN", "TIBC", "IRON", "RETICCTPCT" in the last 72 hours. Urinalysis     Component Value Date/Time   COLORURINE YELLOW 03/04/2023 1330   APPEARANCEUR CLEAR 03/04/2023 1330   LABSPEC 1.010 03/04/2023 1330   PHURINE 8.5 (A) 03/04/2023 1330   GLUCOSEU NEGATIVE 03/04/2023 1330   HGBUR TRACE-INTACT (A) 03/04/2023 1330   BILIRUBINUR NEGATIVE 03/04/2023 1330   BILIRUBINUR pos 05/11/2022 1136   KETONESUR NEGATIVE 03/04/2023 1330   PROTEINUR Positive (A) 05/11/2022 1136   PROTEINUR NEGATIVE 04/12/2022 1718   UROBILINOGEN 0.2 03/04/2023 1330   NITRITE NEGATIVE 03/04/2023 1330   LEUKOCYTESUR MODERATE (A) 03/04/2023 1330   Sepsis Labs Recent Labs  Lab 04/29/23 1129 04/30/23 0439 05/01/23 0554 05/02/23 0756  WBC 17.3* 14.8* 11.6* 6.7   Microbiology No results found for this or any previous visit (from the past 240 hour(s)).   Time coordinating discharge: 35 minutes  SIGNED:   Glade Lloyd, MD  [facility] 05/02/2023, 11:46 AM

## 2023-05-02 NOTE — Progress Notes (Signed)
Report given to [facility]. All questions were answered.

## 2023-05-02 NOTE — TOC Progression Note (Signed)
Transition of Care [facility]) - Progression Note    Patient Details  Name: Charlotte Castro MRN: [id number] Date of Birth: 1933/04/04  [facility] CM/SW Contact  Lorri Frederick, LCSW Phone Number: 05/02/2023, 11:43 AM  Clinical Narrative:   [facility] does offer bed.  CSW spoke with pt and then also spoke with daughter Tyler Pita and they do want to accept this offer.  Medicare payer with inpt order on 11/17, however pt is [place] and CSW confirmed with [facility] email that pt is eligible for the [facility] waiver.  CSW confirmed with [facility] that they can receive pt today under the waiver.  MD informed.    Expected Discharge Plan: Skilled Nursing Facility Barriers to Discharge: Continued Medical Work up, English as a second language teacher  Expected Discharge Plan and Services     Post Acute Care Choice: Skilled Nursing Facility Living arrangements for the past 2 months: Single Family Home                                       Social Determinants of Health (SDOH) Interventions SDOH Screenings   Food Insecurity: No Food Insecurity (04/30/2023)  Housing: Low Risk  (04/30/2023)  Transportation Needs: No Transportation Needs (04/30/2023)  Utilities: Not At Risk (04/30/2023)  Depression (PHQ2-9): Medium Risk (03/02/2023)  Tobacco Use: Low Risk  (04/29/2023)    Readmission Risk Interventions     No data to display

## 2023-05-16 ENCOUNTER — Other Ambulatory Visit: Payer: Self-pay | Admitting: *Deleted

## 2023-05-16 NOTE — Patient Outreach (Signed)
Per [facility] Mrs. Bakos resides in [facility]. Admitted to [facility] under [facility] SNF waiver.   Secure communication sent to [place], SNF social worker to inquire about transition plans/date.  Will continue to follow.   Raiford Noble, MSN, RN, BSN [place]  [facility], [facility] Commun[facility] RN Post- Acute Care Manager Direct Dial: [phone]

## 2023-05-17 ENCOUNTER — Encounter: Payer: Self-pay | Admitting: Physician Assistant

## 2023-05-17 ENCOUNTER — Other Ambulatory Visit (INDEPENDENT_AMBULATORY_CARE_PROVIDER_SITE_OTHER): Payer: Medicare Other

## 2023-05-17 ENCOUNTER — Ambulatory Visit: Payer: Medicare Other | Admitting: Physician Assistant

## 2023-05-17 DIAGNOSIS — S42421A Displaced comminuted supracondylar fracture without intercondylar fracture of right humerus, initial encounter for closed fracture: Secondary | ICD-10-CM

## 2023-05-17 NOTE — Progress Notes (Signed)
Office Visit Note   Patient: Charlotte Castro           Date of Birth: 1933/05/11           MRN: [id number] Visit Date: 05/17/2023              Requested by: Philip Aspen, Limmie Patricia, MD [address] [place],  [place] [address] PCP: Philip Aspen, Limmie Patricia, MD   Assessment & Plan: Visit Diagnoses:  1. Closed displaced comminuted supracondylar fracture of right humerus without intercondylar fracture, initial encounter     Plan: Sweta is a pleasant 89 year old woman who is normally independent living.  She has a medical history for atrial fibrillation and some heart failure but is otherwise fairly healthy and has a fairly low medication profile.  2 weeks ago she was reaching for her cane and fell onto her right side.  She was found down on the floor by her son several hours later she was admitted to [facility] for full evaluation.  Presents today for a displaced proximal humerus fracture at the surgical neck she is neurovascularly intact.  She is in a sling.  I did review her case with both Dr. Ophelia Charter and Dr. Roda Shutters.  She is normally independent living however because of her current situation she is at a nursing facility.  She is checked on by her son on a regular basis who is a Administrator.  She is right-hand dominant.  They have recommended an opinion by Dr. August Saucer  Certainly difficult problem and is normally independent 89 year old woman.  Discussed both conservative and surgical options however we will leave this to Dr. August Saucer  Follow-Up Instructions: No follow-ups on file.   Orders:  No orders of the defined types were placed in this encounter.  No orders of the defined types were placed in this encounter.     Procedures: No procedures performed   Clinical Data: No additional findings.   Subjective: No chief complaint on file.   HPI patient is an 89 year old woman who was previously living independently.  She is little over 2 weeks status post fall onto her right side.   She was down for about 15 hours before she was found by family.  She was taken to the emergency room she does present today had consultation with Dr. Christell Constant with an acute proximal humerus fracture she is right-handed  Review of Systems  All other systems reviewed and are negative.   Objective: Vital Signs: There were no vitals taken for this visit.  Physical Exam Pulmonary:     Effort: Pulmonary effort is normal.  Skin:    General: Skin is warm and dry.  Neurological:     General: No focal deficit present.     Mental Status: She is alert and oriented to person, place, and time.  Psychiatric:        Mood and Affect: Mood normal.        Behavior: Behavior normal.   Ortho Exam She is currently in a sling she has good distal pulses good grip strength sensation is intact Specialty Comments:  No specialty comments available.  Imaging: No results found.   PMFS History: Patient Active Problem List   Diagnosis Date Noted   Closed displaced comminuted supracondylar fracture of right humerus without intercondylar fracture 05/17/2023   At risk for inadequate pain control 04/30/2023   Fall at home, initial encounter 04/29/2023   Humerus fracture 04/29/2023   Inadequate pain control 04/29/2023   Chronic  atrial fibrillation with RVR (HCC) 04/29/2023   Arthritis, multiple joint involvement 04/11/2022   Radiculopathy 04/11/2022   Chest pain 01/13/2021   Orthostasis 01/13/2021   Abdominal pain    Acute cystitis without hematuria    Hiatal hernia 01/29/2019   AKI (acute kidney injury) (HCC) 01/14/2019   B12 deficiency 08/08/2018   Hypothyroidism 08/08/2018   Endometrial cancer (HCC) 08/05/2017   Toe ulcer, left, with unspecified severity (HCC) 08/04/2017   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) 08/04/2017   Postmenopausal bleeding 08/01/2017   Epigastric abdominal pain 04/05/2017   Delirium 04/14/2016   Tremor 04/14/2016   Chronic diastolic CHF  (congestive heart failure) (HCC) 04/10/2016   Essential hypertension, benign 01/18/2014   Long term current use of anticoagulant therapy 01/18/2014   Permanent atrial fibrillation (HCC) 06/10/2011   Past Medical History:  Diagnosis Date   Anemia    history of   Anxiety    Arthritis    "left knee" (04/05/2017)   Atrial fibrillation (HCC)    Atrial flutter (HCC)    typical appearing   Atrial tachycardia (HCC)    ablated 11/17/10  by JA  from the [facility] of the aorta   CHF (congestive heart failure) (HCC)    Chronic lower back pain    Chronic nausea    Endometrial cancer (HCC)    grade 1   Gallstone pancreatitis    Gastritis    GERD (gastroesophageal reflux disease)    History of hiatal hernia    HTN (hypertension)    Hyperlipemia    Hypothyroidism    Obesity    Osteoporosis    Toe ulcer (HCC)    left 3rd toe   Vitamin D deficiency     Family History  Problem Relation Age of Onset   Hypertension Mother    Heart attack Father    Hypertension Father     Past Surgical History:  Procedure Laterality Date   ATRIAL ABLATION SURGERY  11/17/10   Atrial tachycardia arising from [facility] of the aorta ablated by [facility] SURGERY     CARDIAC CATHETERIZATION  01/11/2011   [name] 01/12/2011   CHOLECYSTECTOMY N/A 04/08/2017   Procedure: LAPAROSCOPIC CHOLECYSTECTOMY;  Surgeon: Violeta Gelinas, MD;  Location: [facility] OR;  Service: General;  Laterality: N/A;   FRACTURE SURGERY     HYSTEROSCOPY WITH D & C N/A 08/05/2017   Procedure: DILATATION AND CURETTAGE /HYSTEROSCOPY AND POLYPECTOMY;  Surgeon: Tereso Newcomer, MD;  Location: [facility] ORS;  Service: Gynecology;  Laterality: N/A;   LUMBAR SPINE SURGERY  ~ 1998   ROBOTIC ASSISTED TOTAL HYSTERECTOMY WITH BILATERAL SALPINGO OOPHERECTOMY Bilateral 09/20/2017   Procedure: XI ROBOTIC ASSISTED TOTAL HYSTERECTOMY WITH BILATERAL SALPINGO OOPHORECTOMY, SENTINAL LYMPH NODE BIOPSY;  Surgeon: Adolphus Birchwood, MD;  Location: [facility] ORS;  Service: Gynecology;  Laterality:  Bilateral;   SHOULDER SURGERY Right 1984 X2   /ntoes 01/19/2011   WRIST FRACTURE SURGERY Left 1983   Social History   Occupational History   Not on file  Tobacco Use   Smoking status: Never   Smokeless tobacco: Never  Vaping Use   Vaping status: Never Used  Substance and Sexual Activity   Alcohol use: No   Drug use: No   Sexual activity: Never    Birth control/protection: Post-menopausal

## 2023-05-20 ENCOUNTER — Encounter: Payer: Self-pay | Admitting: Orthopedic Surgery

## 2023-05-20 ENCOUNTER — Ambulatory Visit
Admission: RE | Admit: 2023-05-20 | Discharge: 2023-05-20 | Disposition: A | Discharge status: Home / Self Care | Payer: Medicare Other | Source: Ambulatory Visit | Attending: Orthopedic Surgery | Admitting: Orthopedic Surgery

## 2023-05-20 ENCOUNTER — Ambulatory Visit (INDEPENDENT_AMBULATORY_CARE_PROVIDER_SITE_OTHER): Payer: Medicare Other | Admitting: Orthopedic Surgery

## 2023-05-20 DIAGNOSIS — S42421A Displaced comminuted supracondylar fracture without intercondylar fracture of right humerus, initial encounter for closed fracture: Secondary | ICD-10-CM | POA: Diagnosis not present

## 2023-05-20 NOTE — Progress Notes (Signed)
Post-Op Visit Note   Patient: Charlotte Castro           Date of Birth: 01-06-1933           MRN: [id number] Visit Date: 05/20/2023 PCP: Philip Aspen, Limmie Patricia, MD   Assessment & Plan:  Chief Complaint:  Chief Complaint  Patient presents with   Right Shoulder - Fracture    Fall 04/28/2023   Visit Diagnoses:  1. Closed displaced comminuted supracondylar fracture of right humerus without intercondylar fracture, initial encounter     Plan: Charlotte Castro is an 89 year old patient who is currently at nursing home.  She sustained a proximal humerus fracture on the right-hand side from a fall 2011 1424.  Taking Tylenol and oxycodone.  Still has significant pain in the arm.  She ambulates with a wheelchair.  On examination she has fair amount of pain with range of motion but I think the fracture is moving as a unit.  There is sensory function of the hands intact.  Difficult to really assess deltoid and axillary nerve function due to pain.  Impression is displaced right proximal humerus fracture.  CT scan will better show the amount of displacement and comminution of the head.  I do not think open reduction internal fixation is an option at this time.  Essentially her options are to live with this and let it heal and as the pain gets better see what kind of function she has.  Alternatively reverse shoulder replacement could be considered which may be more predictable for pain relief but would carry with that the inherent risk of surgery.  She did have some pre-existing arthritis in the shoulder joint itself from prior radiographs.  Follow-up next week after thin cut CT scan.  Follow-Up Instructions: No follow-ups on file.   Orders:  Orders Placed This Encounter  Procedures   CT SHOULDER RIGHT WO CONTRAST   No orders of the defined types were placed in this encounter.   Imaging: No results found.  PMFS History: Patient Active Problem List   Diagnosis Date Noted   Closed displaced  comminuted supracondylar fracture of right humerus without intercondylar fracture 05/17/2023   At risk for inadequate pain control 04/30/2023   Fall at home, initial encounter 04/29/2023   Humerus fracture 04/29/2023   Inadequate pain control 04/29/2023   Chronic atrial fibrillation with RVR (HCC) 04/29/2023   Arthritis, multiple joint involvement 04/11/2022   Radiculopathy 04/11/2022   Chest pain 01/13/2021   Orthostasis 01/13/2021   Abdominal pain    Acute cystitis without hematuria    Hiatal hernia 01/29/2019   AKI (acute kidney injury) (HCC) 01/14/2019   B12 deficiency 08/08/2018   Hypothyroidism 08/08/2018   Endometrial cancer (HCC) 08/05/2017   Toe ulcer, left, with unspecified severity (HCC) 08/04/2017   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) 08/04/2017   Postmenopausal bleeding 08/01/2017   Epigastric abdominal pain 04/05/2017   Delirium 04/14/2016   Tremor 04/14/2016   Chronic diastolic CHF (congestive heart failure) (HCC) 04/10/2016   Essential hypertension, benign 01/18/2014   Long term current use of anticoagulant therapy 01/18/2014   Permanent atrial fibrillation (HCC) 06/10/2011   Past Medical History:  Diagnosis Date   Anemia    history of   Anxiety    Arthritis    "left knee" (04/05/2017)   Atrial fibrillation (HCC)    Atrial flutter (HCC)    typical appearing   Atrial tachycardia (HCC)    ablated 11/17/10  by JA  from the [facility] of the aorta   CHF (congestive heart failure) (HCC)    Chronic lower back pain    Chronic nausea    Endometrial cancer (HCC)    grade 1   Gallstone pancreatitis    Gastritis    GERD (gastroesophageal reflux disease)    History of hiatal hernia    HTN (hypertension)    Hyperlipemia    Hypothyroidism    Obesity    Osteoporosis    Toe ulcer (HCC)    left 3rd toe   Vitamin D deficiency     Family History  Problem Relation Age of Onset   Hypertension Mother    Heart attack Father     Hypertension Father     Past Surgical History:  Procedure Laterality Date   ATRIAL ABLATION SURGERY  11/17/10   Atrial tachycardia arising from [facility] of the aorta ablated by [facility] SURGERY     CARDIAC CATHETERIZATION  01/11/2011   [name] 01/12/2011   CHOLECYSTECTOMY N/A 04/08/2017   Procedure: LAPAROSCOPIC CHOLECYSTECTOMY;  Surgeon: Violeta Gelinas, MD;  Location: [facility] OR;  Service: General;  Laterality: N/A;   FRACTURE SURGERY     HYSTEROSCOPY WITH D & C N/A 08/05/2017   Procedure: DILATATION AND CURETTAGE /HYSTEROSCOPY AND POLYPECTOMY;  Surgeon: Tereso Newcomer, MD;  Location: [facility] ORS;  Service: Gynecology;  Laterality: N/A;   LUMBAR SPINE SURGERY  ~ 1998   ROBOTIC ASSISTED TOTAL HYSTERECTOMY WITH BILATERAL SALPINGO OOPHERECTOMY Bilateral 09/20/2017   Procedure: XI ROBOTIC ASSISTED TOTAL HYSTERECTOMY WITH BILATERAL SALPINGO OOPHORECTOMY, SENTINAL LYMPH NODE BIOPSY;  Surgeon: Adolphus Birchwood, MD;  Location: [facility] ORS;  Service: Gynecology;  Laterality: Bilateral;   SHOULDER SURGERY Right 1984 X2   /ntoes 01/19/2011   WRIST FRACTURE SURGERY Left 1983   Social History   Occupational History   Not on file  Tobacco Use   Smoking status: Never   Smokeless tobacco: Never  Vaping Use   Vaping status: Never Used  Substance and Sexual Activity   Alcohol use: No   Drug use: No   Sexual activity: Never    Birth control/protection: Post-menopausal

## 2023-05-23 ENCOUNTER — Telehealth: Payer: Self-pay

## 2023-05-23 ENCOUNTER — Telehealth: Payer: Self-pay | Admitting: Orthopedic Surgery

## 2023-05-23 NOTE — Telephone Encounter (Signed)
Pt's daughter Babette Relic called requesting a call back about CT Scan and referral for CT Scan. Please call Tammie at [phone].

## 2023-05-23 NOTE — Telephone Encounter (Signed)
Talked with patients son this morning. Patient is at [employer]. He took patient for CT scan last week but they were not able to do the scan due to patient needing to be transferred to CT table with hoyer lift and he stated Imaging facility not able to accommodate. He is working on trying to get her scheduled at the hospital where transferring to imaging table will be easier for patient. He will follow up with [place] once scan has been completed

## 2023-05-23 NOTE — Telephone Encounter (Signed)
-----   Message from Burnard Bunting sent at 05/20/2023  6:33 PM EST ----- [name] can you put her on Charlotte Castro schedule for sometime next week for follow-up so we can figure out what we want to do.  She had a CT scan.  Thanks

## 2023-05-24 ENCOUNTER — Other Ambulatory Visit: Payer: Self-pay | Admitting: *Deleted

## 2023-05-24 NOTE — Patient Outreach (Signed)
Post- Acute Care Manager follow up. Charlotte Castro resides in [facility]. She previously admitted under [facility] waiver.  Previous update received from [place], [place] social worker. Charlotte Castro continues to work with therapy. However, progress is slow. Transition plans are pending.   Will continue to follow.   Raiford Noble, MSN, RN, BSN [place]  [facility], Healthy Communities RN Post- Acute Care Manager Direct Dial: [phone]

## 2023-05-25 ENCOUNTER — Ambulatory Visit: Payer: Medicare Other | Admitting: Orthopedic Surgery

## 2023-05-26 NOTE — Telephone Encounter (Signed)
Ok thx.

## 2023-05-27 ENCOUNTER — Ambulatory Visit (HOSPITAL_COMMUNITY)
Admission: RE | Admit: 2023-05-27 | Discharge: 2023-05-27 | Disposition: A | Discharge status: Home / Self Care | Payer: Medicare Other | Source: Ambulatory Visit | Attending: Orthopedic Surgery | Admitting: Orthopedic Surgery

## 2023-05-27 DIAGNOSIS — S42421A Displaced comminuted supracondylar fracture without intercondylar fracture of right humerus, initial encounter for closed fracture: Secondary | ICD-10-CM | POA: Diagnosis present

## 2023-06-01 ENCOUNTER — Telehealth: Payer: Self-pay | Admitting: Orthopedic Surgery

## 2023-06-01 NOTE — Telephone Encounter (Signed)
Patient called and wants to know what's next after the CT scan? [phone]

## 2023-06-02 ENCOUNTER — Encounter (HOSPITAL_BASED_OUTPATIENT_CLINIC_OR_DEPARTMENT_OTHER): Payer: Self-pay

## 2023-06-02 ENCOUNTER — Encounter: Payer: Medicare Other | Admitting: Internal Medicine

## 2023-06-02 ENCOUNTER — Telehealth: Payer: Self-pay | Admitting: Orthopedic Surgery

## 2023-06-02 NOTE — Telephone Encounter (Signed)
Pt called in from VM left from Autumn H from stating August Saucer would like her to come in next Friday nor August Saucer or Franky Macho are in office next Friday please advise call daughter Tyler Pita on file to talk about scheduling

## 2023-06-02 NOTE — Telephone Encounter (Signed)
My chart message sent and LVM for pt to cb to discuss

## 2023-06-02 NOTE — Telephone Encounter (Signed)
Shoulder is located.  I think there may be some early healing going on.  Would not do surgery at this point.  We can always do that later if needed if it goes on to nonunion but the scan looks like it is showing some early healing.  I would stay in the sling and come back sometime next week for clinical recheck maybe on Friday.

## 2023-06-02 NOTE — Telephone Encounter (Signed)
Called daughter and worked in for Friday per Dr. Diamantina Providence message

## 2023-06-10 ENCOUNTER — Ambulatory Visit (INDEPENDENT_AMBULATORY_CARE_PROVIDER_SITE_OTHER): Payer: Medicare Other | Admitting: Orthopedic Surgery

## 2023-06-10 DIAGNOSIS — S42421A Displaced comminuted supracondylar fracture without intercondylar fracture of right humerus, initial encounter for closed fracture: Secondary | ICD-10-CM

## 2023-06-11 ENCOUNTER — Encounter: Payer: Self-pay | Admitting: Orthopedic Surgery

## 2023-06-11 NOTE — Progress Notes (Signed)
Post-Op Visit Note   Patient: Charlotte Castro           Date of Birth: 30-Apr-1933           MRN: [id number] Visit Date: 06/10/2023 PCP: Philip Aspen, Limmie Patricia, MD   Assessment & Plan:  Chief Complaint:  Chief Complaint  Patient presents with   Right Shoulder - Follow-up    Review CT scan   Visit Diagnoses:  1. Closed displaced comminuted supracondylar fracture of right humerus without intercondylar fracture, initial encounter     Plan: [name] is an 89 year old patient with right shoulder proximal humerus fracture.  This occurred in the setting of significant preinjury glenohumeral arthritis.  On examination the fracture is moving as a unit.  Plan is to discontinue the sling.  Physical therapy to start on the right shoulder for passive range of motion as well as active assisted range of motion to pain tolerance.  6-week return for clinical recheck.  She will need radiographs at that time to check callus formation and we may consider an intra-articular injection at that time as well.  CT scan does show complex proximal humerus fracture with some anterior displacement of the shaft relative to the humeral head but I think this can heal and for [name] that would be optimal in order to avoid surgery.  Follow-Up Instructions: No follow-ups on file.   Orders:  No orders of the defined types were placed in this encounter.  No orders of the defined types were placed in this encounter.   Imaging: No results found.  PMFS History: Patient Active Problem List   Diagnosis Date Noted   Closed displaced comminuted supracondylar fracture of right humerus without intercondylar fracture 05/17/2023   At risk for inadequate pain control 04/30/2023   Fall at home, initial encounter 04/29/2023   Humerus fracture 04/29/2023   Inadequate pain control 04/29/2023   Chronic atrial fibrillation with RVR (HCC) 04/29/2023   Arthritis, multiple joint involvement 04/11/2022   Radiculopathy 04/11/2022    Chest pain 01/13/2021   Orthostasis 01/13/2021   Abdominal pain    Acute cystitis without hematuria    Hiatal hernia 01/29/2019   AKI (acute kidney injury) (HCC) 01/14/2019   B12 deficiency 08/08/2018   Hypothyroidism 08/08/2018   Endometrial cancer (HCC) 08/05/2017   Toe ulcer, left, with unspecified severity (HCC) 08/04/2017   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) 08/04/2017   Postmenopausal bleeding 08/01/2017   Epigastric abdominal pain 04/05/2017   Delirium 04/14/2016   Tremor 04/14/2016   Chronic diastolic CHF (congestive heart failure) (HCC) 04/10/2016   Essential hypertension, benign 01/18/2014   Long term current use of anticoagulant therapy 01/18/2014   Permanent atrial fibrillation (HCC) 06/10/2011   Past Medical History:  Diagnosis Date   Anemia    history of   Anxiety    Arthritis    "left knee" (04/05/2017)   Atrial fibrillation (HCC)    Atrial flutter (HCC)    typical appearing   Atrial tachycardia (HCC)    ablated 11/17/10  by JA  from the [facility] of the aorta   CHF (congestive heart failure) (HCC)    Chronic lower back pain    Chronic nausea    Endometrial cancer (HCC)    grade 1   Gallstone pancreatitis    Gastritis    GERD (gastroesophageal reflux disease)    History of hiatal hernia    HTN (hypertension)    Hyperlipemia  Hypothyroidism    Obesity    Osteoporosis    Toe ulcer (HCC)    left 3rd toe   Vitamin D deficiency     Family History  Problem Relation Age of Onset   Hypertension Mother    Heart attack Father    Hypertension Father     Past Surgical History:  Procedure Laterality Date   ATRIAL ABLATION SURGERY  11/17/10   Atrial tachycardia arising from [facility] of the aorta ablated by [facility] SURGERY     CARDIAC CATHETERIZATION  01/11/2011   [name] 01/12/2011   CHOLECYSTECTOMY N/A 04/08/2017   Procedure: LAPAROSCOPIC CHOLECYSTECTOMY;  Surgeon: Violeta Gelinas, MD;  Location: [facility] OR;  Service: General;   Laterality: N/A;   FRACTURE SURGERY     HYSTEROSCOPY WITH D & C N/A 08/05/2017   Procedure: DILATATION AND CURETTAGE /HYSTEROSCOPY AND POLYPECTOMY;  Surgeon: Tereso Newcomer, MD;  Location: [facility] ORS;  Service: Gynecology;  Laterality: N/A;   LUMBAR SPINE SURGERY  ~ 1998   ROBOTIC ASSISTED TOTAL HYSTERECTOMY WITH BILATERAL SALPINGO OOPHERECTOMY Bilateral 09/20/2017   Procedure: XI ROBOTIC ASSISTED TOTAL HYSTERECTOMY WITH BILATERAL SALPINGO OOPHORECTOMY, SENTINAL LYMPH NODE BIOPSY;  Surgeon: Adolphus Birchwood, MD;  Location: [facility] ORS;  Service: Gynecology;  Laterality: Bilateral;   SHOULDER SURGERY Right 1984 X2   /ntoes 01/19/2011   WRIST FRACTURE SURGERY Left 1983   Social History   Occupational History   Not on file  Tobacco Use   Smoking status: Never   Smokeless tobacco: Never  Vaping Use   Vaping status: Never Used  Substance and Sexual Activity   Alcohol use: No   Drug use: No   Sexual activity: Never    Birth control/protection: Post-menopausal

## 2023-06-13 ENCOUNTER — Other Ambulatory Visit: Payer: Self-pay | Admitting: *Deleted

## 2023-06-13 NOTE — Patient Outreach (Signed)
Post- Acute Care Manager follow up. Mrs. Brandin resides in [facility] per [employer]. She admitted to [facility] under [facility] SN[facility] waiver previously.   Secure message sent to [facility], SNF social worker, to inquire about transition plans/date.   Raiford Noble, MSN, RN, BSN [place]  [facility], Healthy Communities RN Post- Acute Care Manager Direct Dial: [phone]

## 2023-06-16 ENCOUNTER — Encounter: Payer: Self-pay | Admitting: Neurology

## 2023-06-16 ENCOUNTER — Telehealth: Payer: Self-pay

## 2023-06-16 NOTE — Telephone Encounter (Signed)
 Called pt daughter she stated that Dr Jakie cut pt carbidopa  levodopa  from 3 pills a day to 1 pill a day last week , and is doing blood work pt is still having increased hallucinations. Pt daughter is asking what is the medication doing for her mother? Is there anything else she can take? Is the medication for tremors? If she has Parkinson's  what do they need to look out for? Can she go back on gabapentin  for tremors? Pt is not eating well. She is on a lot of vitamins per her daughter they want to decrease her medication to see if diarrhea  and hallucinations clear up.

## 2023-06-16 NOTE — Telephone Encounter (Signed)
 Pt daughter called phone note made to follow up

## 2023-06-16 NOTE — Telephone Encounter (Signed)
 Spoke with Charlotte Castro at [employer] If they decreased the medication and she is having increased hallucinations, its likely not from the carbidopa /levodopa .  That being said, since Dr. Jakie seems to be managing her Parkinsons Disease now and changing the medication, she can ask him if he would like to stop it. They will let Dr Jakie know, also called Pt daughter back.

## 2023-06-27 ENCOUNTER — Telehealth: Payer: Self-pay | Admitting: Internal Medicine

## 2023-06-27 NOTE — Telephone Encounter (Signed)
 Copied from CRM [id number]. Topic: General - Other >> Jun 24, 2023  3:54 PM Charlotte Castro wrote: Reason for CRM: Patient representative called in to advise that patients physical appointment has already been completed by Dr.Patterson and she is now under Dr.Patterson's care until further notice or discharge from [facility]

## 2023-06-29 ENCOUNTER — Ambulatory Visit: Payer: Medicare Other | Admitting: Orthopedic Surgery

## 2023-07-04 ENCOUNTER — Telehealth: Payer: Self-pay | Admitting: Orthopedic Surgery

## 2023-07-04 NOTE — Telephone Encounter (Signed)
Charlotte Castro patient's daughter wanted Dr August Saucer to know that the patient fell about a week ago and the patient states the facility has not been doing much physical therapy with her arm. Tammy will not be able to be at appt on 07/08/23.

## 2023-07-05 ENCOUNTER — Encounter: Payer: Self-pay | Admitting: Neurology

## 2023-07-05 NOTE — Progress Notes (Unsigned)
Assessment/Plan:    1.  Parkinsons disease             -Likely longstanding.  She has been somewhat resistant to the diagnosis.    -It appears that they had backed down on medications after a fall when she entered SNF, because she was having diarrhea and hallucinations, but the hallucinations got worse after the levodopa was decreased.  Ultimately, I talked to her about at least trialing CR levodopa.     2.  Right proximal humerus fracture  -This occurred after a fall, in the setting of longstanding glenohumeral arthritis.  She has had longstanding chronic shoulder pain  -Patient following with orthopedics   3.  Chronic dizziness             -On metoprolol for history of tachyarrhythmia/bradycardia arrhythmia and for A-fib control.  Will need to talk to cardiology to see if this contributes.  Certainly, the addition of levodopa can also make dizziness worse.  Her blood pressure was not low in the office today.             -This is very longstanding.  I saw her a decade ago and she refused orthostatics even back then and refused compression stockings.  I would worry currently about attempting to try other things like Florinef or midodrine, because her sitting blood pressure is already so high.  She needs to follow back up with cardiology.  She has declined vestibular rehab per primary care notes.  Subjective:   Charlotte Castro was seen today in follow up for Parkinsons disease.  My previous records were reviewed prior to todays visit as well as outside records available to me. Pt was very resistant to the diagnosis and had likely had it for quite some time, being told by other physicians that she had had it prior to me.  We decided to add levodopa last visit, which was in October.  At that point in time, the patient told me that she did not want to follow back up here.  She declined any Parkinson's therapies at that time.  Much has happened since that time.  She was hospitalized in November  after a fall and sustained a comminuted fracture of the right proximal humerus.  Orthopedics note indicated that this occurred in the setting of significant preinjury glenohumeral arthritis.  Upon admission, it was noted that patient had A-fib with RVR.  She also had a urinary tract infection.  Patient was discharged to SNF.  Surgery was avoided.  We received a call in early January from the nursing facility that patient was having diarrhea and hallucinations.  They have been following up with primary care, who decreased her levodopa to 1 time per day.  They noted patient was having increased hallucinations as they decreased her levodopa.  The nurse at the facility who called stated that the primary was going to be doing blood work to see if there is other reasons for the hallucinations, so we deferred to them, but offered the appointment today.  Current prescribed movement disorder medications: ***   PREVIOUS MEDICATIONS: {Parkinson's RX:18200}  ALLERGIES:   Allergies  Allergen Reactions   Iodinated Contrast Media Shortness Of Breath and Other (See Comments)    "Allergic," per MAR- Causes headaches, also   Zosyn [Piperacillin Sod-Tazobactam So] Other (See Comments)    "Allergic," per [facility]   Penicillins Other (See Comments)    Tolerated Zosyn Oct 2018, but "Allergic," per [facility] Did it involve swelling  of the face/tongue/throat, SOB, or low BP? Unk Did it involve sudden or severe rash/hives, skin peeling, or any reaction on the inside of your mouth or nose? Unk Did you need to seek medical attention at a hospital or doctor's office? Unk When did it last happen? Unk If all above answers are "NO", may proceed with cephalosporin use.     CURRENT MEDICATIONS:  No outpatient medications have been marked as taking for the 07/07/23 encounter (Appointment) with Avary Eichenberger, Octaviano Batty, DO.     Objective:   PHYSICAL EXAMINATION:    VITALS:  There were no vitals filed for this visit.  GEN:  The patient  appears stated age and is in NAD. HEENT:  Normocephalic, atraumatic.  The mucous membranes are moist. The superficial temporal arteries are without ropiness or tenderness. CV:  RRR Lungs:  CTAB Neck/HEME:  There are no carotid bruits bilaterally.  Neurological examination:  Orientation: The patient is alert and oriented x3. Cranial nerves: There is good facial symmetry. The speech is fluent and clear. Soft palate rises symmetrically and there is no tongue deviation. Hearing is intact to conversational tone. Sensation: Sensation is intact to light touch throughout Motor: Strength is at least 4-/5 (diffuse give way weakness and cries out with pain, esp in the RUE).   Shoulder shrug is equal and symmetric.  There is no pronator drift.   Movement examination: Tone: There is mild to mod increased tone in the RUE.  There is normal tone elsewhere Abnormal movements: there is RUE rest tremor.  She has chin tremor.   She does have some postural tremor.  She has trouble with archimedes spirals on the right Coordination:  There is difficult to tell decremation due to pain.   Gait and Station: The patient pushes off to arise.  She yells out in pain as she arises.  She is short stepped.  She ambulates with a 4 pronged cane.  She does not shuffle.  I have reviewed and interpreted the following labs independently    Chemistry      Component Value Date/Time   NA 137 05/02/2023 0756   NA 143 02/07/2023 1122   K 4.0 05/02/2023 0756   CL 107 05/02/2023 0756   CO2 22 05/02/2023 0756   BUN 25 (H) 05/02/2023 0756   BUN 20 02/07/2023 1122   CREATININE 1.03 (H) 05/02/2023 0756   CREATININE 0.94 (H) 08/07/2020 1413      Component Value Date/Time   CALCIUM 8.8 (L) 05/02/2023 0756   ALKPHOS 67 04/29/2023 1129   AST 39 04/29/2023 1129   ALT 22 04/29/2023 1129   BILITOT 2.6 (H) 04/29/2023 1129   BILITOT 1.0 02/07/2023 1122       Lab Results  Component Value Date   WBC 6.7 05/02/2023   HGB 12.3  05/02/2023   HCT 35.1 (L) 05/02/2023   MCV 93.9 05/02/2023   PLT 164 05/02/2023    Lab Results  Component Value Date   TSH 2.630 02/07/2023     Total time spent on today's visit was ***30 minutes, including both face-to-face time and nonface-to-face time.  Time included that spent on review of records (prior notes available to me/labs/imaging if pertinent), discussing treatment and goals, answering patient's questions and coordinating care.  Cc:  Philip Aspen, Limmie Patricia, MD

## 2023-07-07 ENCOUNTER — Ambulatory Visit (INDEPENDENT_AMBULATORY_CARE_PROVIDER_SITE_OTHER): Payer: Medicare Other | Admitting: Neurology

## 2023-07-07 VITALS — BP 124/72 | HR 92

## 2023-07-07 DIAGNOSIS — F29 Unspecified psychosis not due to a substance or known physiological condition: Secondary | ICD-10-CM | POA: Diagnosis not present

## 2023-07-07 DIAGNOSIS — G20A1 Parkinson's disease without dyskinesia, without mention of fluctuations: Secondary | ICD-10-CM

## 2023-07-08 ENCOUNTER — Encounter: Payer: Self-pay | Admitting: Orthopedic Surgery

## 2023-07-08 ENCOUNTER — Other Ambulatory Visit (INDEPENDENT_AMBULATORY_CARE_PROVIDER_SITE_OTHER): Payer: Self-pay

## 2023-07-08 ENCOUNTER — Ambulatory Visit (INDEPENDENT_AMBULATORY_CARE_PROVIDER_SITE_OTHER): Payer: Medicare Other | Admitting: Orthopedic Surgery

## 2023-07-08 DIAGNOSIS — S42421A Displaced comminuted supracondylar fracture without intercondylar fracture of right humerus, initial encounter for closed fracture: Secondary | ICD-10-CM

## 2023-07-08 NOTE — Progress Notes (Signed)
Post-Op Visit Note   Patient: Charlotte Castro           Date of Birth: 1933-05-16           MRN: [id number] Visit Date: 07/08/2023 PCP: Garlan Fillers, MD   Assessment & Plan:  Chief Complaint:  Chief Complaint  Patient presents with   Right Shoulder - Follow-up    Review CT scan   Visit Diagnoses:  1. Closed displaced comminuted supracondylar fracture of right humerus without intercondylar fracture, initial encounter     Plan: Charlotte Castro is an 89 year old patient is now about 2 months out right proximal humerus fracture.  Patient has some underlying arthritis as well.  On exam the fracture still moves as a unit but her functional restoration will be marginal at best.  Continue with occupational therapy for range of motion as tolerated as well as physical therapy for generalized weakness to be improved for ADLs.  Follow-up in 8 weeks for final check.  Follow-Up Instructions: No follow-ups on file.   Orders:  Orders Placed This Encounter  Procedures   XR Shoulder Right   No orders of the defined types were placed in this encounter.   Imaging: XR Shoulder Right Result Date: 07/08/2023 Multiple radiographic views right proximal humerus reviewed.  Displaced proximal humerus fracture again noted without much change in position or alignment compared to prior radiographs.  Some callus formation is present but it is not robust.  Shoulder remains located with anterior displacement of the shaft relative to the head.   PMFS History: Patient Active Problem List   Diagnosis Date Noted   Closed displaced comminuted supracondylar fracture of right humerus without intercondylar fracture 05/17/2023   At risk for inadequate pain control 04/30/2023   Fall at home, initial encounter 04/29/2023   Humerus fracture 04/29/2023   Inadequate pain control 04/29/2023   Chronic atrial fibrillation with RVR (HCC) 04/29/2023   Arthritis, multiple joint involvement 04/11/2022   Radiculopathy  04/11/2022   Chest pain 01/13/2021   Orthostasis 01/13/2021   Abdominal pain    Acute cystitis without hematuria    Hiatal hernia 01/29/2019   AKI (acute kidney injury) (HCC) 01/14/2019   B12 deficiency 08/08/2018   Hypothyroidism 08/08/2018   Endometrial cancer (HCC) 08/05/2017   Toe ulcer, left, with unspecified severity (HCC) 08/04/2017   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) 08/04/2017   Postmenopausal bleeding 08/01/2017   Epigastric abdominal pain 04/05/2017   Delirium 04/14/2016   Tremor 04/14/2016   Chronic diastolic CHF (congestive heart failure) (HCC) 04/10/2016   Essential hypertension, benign 01/18/2014   Long term current use of anticoagulant therapy 01/18/2014   Permanent atrial fibrillation (HCC) 06/10/2011   Past Medical History:  Diagnosis Date   Anemia    history of   Anxiety    Arthritis    "left knee" (04/05/2017)   Atrial fibrillation (HCC)    Atrial flutter (HCC)    typical appearing   Atrial tachycardia (HCC)    ablated 11/17/10  by JA  from the [facility] of the aorta   CHF (congestive heart failure) (HCC)    Chronic lower back pain    Chronic nausea    Endometrial cancer (HCC)    grade 1   Gallstone pancreatitis    Gastritis    GERD (gastroesophageal reflux disease)    History of hiatal hernia    HTN (hypertension)    Hyperlipemia    Hypothyroidism    Obesity  Osteoporosis    Toe ulcer (HCC)    left 3rd toe   Vitamin D deficiency     Family History  Problem Relation Age of Onset   Hypertension Mother    Heart attack Father    Hypertension Father     Past Surgical History:  Procedure Laterality Date   ATRIAL ABLATION SURGERY  11/17/10   Atrial tachycardia arising from [facility] of the aorta ablated by [facility] SURGERY     CARDIAC CATHETERIZATION  01/11/2011   [name] 01/12/2011   CHOLECYSTECTOMY N/A 04/08/2017   Procedure: LAPAROSCOPIC CHOLECYSTECTOMY;  Surgeon: Violeta Gelinas, MD;  Location: [facility] OR;   Service: General;  Laterality: N/A;   FRACTURE SURGERY     HYSTEROSCOPY WITH D & C N/A 08/05/2017   Procedure: DILATATION AND CURETTAGE /HYSTEROSCOPY AND POLYPECTOMY;  Surgeon: Tereso Newcomer, MD;  Location: [facility] ORS;  Service: Gynecology;  Laterality: N/A;   LUMBAR SPINE SURGERY  ~ 1998   ROBOTIC ASSISTED TOTAL HYSTERECTOMY WITH BILATERAL SALPINGO OOPHERECTOMY Bilateral 09/20/2017   Procedure: XI ROBOTIC ASSISTED TOTAL HYSTERECTOMY WITH BILATERAL SALPINGO OOPHORECTOMY, SENTINAL LYMPH NODE BIOPSY;  Surgeon: Adolphus Birchwood, MD;  Location: [facility] ORS;  Service: Gynecology;  Laterality: Bilateral;   SHOULDER SURGERY Right 1984 X2   /ntoes 01/19/2011   WRIST FRACTURE SURGERY Left 1983   Social History   Occupational History   Not on file  Tobacco Use   Smoking status: Never   Smokeless tobacco: Never  Vaping Use   Vaping status: Never Used  Substance and Sexual Activity   Alcohol use: No   Drug use: No   Sexual activity: Never    Birth control/protection: Post-menopausal

## 2023-07-29 ENCOUNTER — Telehealth: Payer: Self-pay | Admitting: Orthopedic Surgery

## 2023-07-29 NOTE — Telephone Encounter (Signed)
I tried to call-left voicemail.

## 2023-07-29 NOTE — Telephone Encounter (Signed)
Patient daughter(Tammie) called regarding her mothers therapy. She advised to give her a call back please. Tammie [phone]

## 2023-08-01 ENCOUNTER — Emergency Department (HOSPITAL_COMMUNITY): Payer: Medicare Other

## 2023-08-01 ENCOUNTER — Encounter: Payer: Self-pay | Admitting: Neurology

## 2023-08-01 ENCOUNTER — Encounter (HOSPITAL_COMMUNITY): Payer: Self-pay

## 2023-08-01 ENCOUNTER — Other Ambulatory Visit: Payer: Self-pay

## 2023-08-01 ENCOUNTER — Emergency Department (HOSPITAL_COMMUNITY)
Admission: EM | Admit: 2023-08-01 | Discharge: 2023-08-02 | Disposition: A | Discharge status: Skilled Nursing Facility | Payer: Medicare Other | Attending: Emergency Medicine | Admitting: Emergency Medicine

## 2023-08-01 DIAGNOSIS — I4892 Unspecified atrial flutter: Secondary | ICD-10-CM

## 2023-08-01 DIAGNOSIS — I110 Hypertensive heart disease with heart failure: Secondary | ICD-10-CM | POA: Diagnosis not present

## 2023-08-01 DIAGNOSIS — Z7901 Long term (current) use of anticoagulants: Secondary | ICD-10-CM | POA: Insufficient documentation

## 2023-08-01 DIAGNOSIS — E039 Hypothyroidism, unspecified: Secondary | ICD-10-CM | POA: Insufficient documentation

## 2023-08-01 DIAGNOSIS — R4689 Other symptoms and signs involving appearance and behavior: Secondary | ICD-10-CM

## 2023-08-01 DIAGNOSIS — R456 Violent behavior: Secondary | ICD-10-CM | POA: Insufficient documentation

## 2023-08-01 DIAGNOSIS — I509 Heart failure, unspecified: Secondary | ICD-10-CM | POA: Diagnosis not present

## 2023-08-01 DIAGNOSIS — I4891 Unspecified atrial fibrillation: Secondary | ICD-10-CM | POA: Insufficient documentation

## 2023-08-01 DIAGNOSIS — Z79890 Hormone replacement therapy (postmenopausal): Secondary | ICD-10-CM | POA: Diagnosis not present

## 2023-08-01 DIAGNOSIS — Z79899 Other long term (current) drug therapy: Secondary | ICD-10-CM | POA: Diagnosis not present

## 2023-08-01 DIAGNOSIS — N390 Urinary tract infection, site not specified: Secondary | ICD-10-CM

## 2023-08-01 DIAGNOSIS — R4182 Altered mental status, unspecified: Secondary | ICD-10-CM | POA: Diagnosis present

## 2023-08-01 LAB — COMPREHENSIVE METABOLIC PANEL
ALT: 8 U/L (ref 0–44)
AST: 17 U/L (ref 15–41)
Albumin: 3.1 g/dL — ABNORMAL LOW (ref 3.5–5.0)
Alkaline Phosphatase: 89 U/L (ref 38–126)
Anion gap: 10 (ref 5–15)
BUN: 24 mg/dL — ABNORMAL HIGH (ref 8–23)
CO2: 26 mmol/L (ref 22–32)
Calcium: 9.4 mg/dL (ref 8.9–10.3)
Chloride: 107 mmol/L (ref 98–111)
Creatinine, Ser: 0.74 mg/dL (ref 0.44–1.00)
GFR, Estimated: >60 mL/min (ref >60)
Glucose, Bld: 106 mg/dL — ABNORMAL HIGH (ref 70–99)
Potassium: 4.3 mmol/L (ref 3.5–5.1)
Sodium: 143 mmol/L (ref 135–145)
Total Bilirubin: 0.8 mg/dL (ref 0.0–1.2)
Total Protein: 7.4 g/dL (ref 6.5–8.1)

## 2023-08-01 LAB — CBC WITH DIFFERENTIAL/PLATELET
Abs Immature Granulocytes: 0.09 10*3/uL — ABNORMAL HIGH (ref 0.00–0.07)
Basophils Absolute: 0.1 10*3/uL (ref 0.0–0.1)
Basophils Relative: 1 %
Eosinophils Absolute: 0.2 10*3/uL (ref 0.0–0.5)
Eosinophils Relative: 1 %
HCT: 38.8 % (ref 36.0–46.0)
Hemoglobin: 12.7 g/dL (ref 12.0–15.0)
Immature Granulocytes: 1 %
Lymphocytes Relative: 16 %
Lymphs Abs: 1.8 10*3/uL (ref 0.7–4.0)
MCH: 31.4 pg (ref 26.0–34.0)
MCHC: 32.7 g/dL (ref 30.0–36.0)
MCV: 96 fL (ref 80.0–100.0)
Monocytes Absolute: 0.9 10*3/uL (ref 0.1–1.0)
Monocytes Relative: 8 %
Neutro Abs: 8.2 10*3/uL — ABNORMAL HIGH (ref 1.7–7.7)
Neutrophils Relative %: 73 %
Platelets: 319 10*3/uL (ref 150–400)
RBC: 4.04 MIL/uL (ref 3.87–5.11)
RDW: 12.6 % (ref 11.5–15.5)
WBC: 11.2 10*3/uL — ABNORMAL HIGH (ref 4.0–10.5)
nRBC: 0 % (ref 0.0–0.2)

## 2023-08-01 LAB — RESP PANEL BY RT-PCR (RSV, FLU A&B, COVID)  RVPGX2
Influenza A by PCR: NEGATIVE
Influenza B by PCR: NEGATIVE
Resp Syncytial Virus by PCR: NEGATIVE
SARS Coronavirus 2 by RT PCR: NEGATIVE

## 2023-08-01 LAB — CBG MONITORING, ED: Glucose-Capillary: 88 mg/dL (ref 70–99)

## 2023-08-01 LAB — MAGNESIUM: Magnesium: 1.9 mg/dL (ref 1.7–2.4)

## 2023-08-01 MED ORDER — METOPROLOL TARTRATE 5 MG/5ML IV SOLN
2.5000 mg | INTRAVENOUS | Status: DC | PRN
  Administered 2023-08-01: 2.5 mg via INTRAVENOUS
  Filled 2023-08-01: qty 5

## 2023-08-01 MED ORDER — HALOPERIDOL 1 MG PO TABS
1.0000 mg | ORAL_TABLET | Freq: Once | ORAL | Status: AC
  Administered 2023-08-01: 1 mg via ORAL
  Filled 2023-08-01: qty 1

## 2023-08-01 MED ORDER — LACTATED RINGERS IV BOLUS
500.0000 mL | Freq: Once | INTRAVENOUS | Status: AC
  Administered 2023-08-01: 500 mL via INTRAVENOUS

## 2023-08-01 NOTE — ED Provider Notes (Addendum)
[place] EMERGENCY DEPARTMENT AT [facility] Provider Note   CSN: [id number] Arrival date & time: 08/01/23  1642     History  Chief Complaint  Patient presents with   Altered Mental Status   Aggressive Behavior    Charlotte Castro is a 89 y.o. female with PMH as listed below who [facility] from [facility] center with reports of altered mental status, increased agitation, throwing items, and threats made to other residents as well as herself.  Reportedly has had combative behavior as well as refusing medications and food for the last several days.  She received IM Ativan at 2 PM at her facility.  On arrival patient states that she is here because of a fall in which she hit her head.  With EMS patient refused IV and EKG.  She is noted to have a heart rate of 136 with a history of A-fib.   Past Medical History:  Diagnosis Date   Anemia    history of   Anxiety    Arthritis    "left knee" (04/05/2017)   Atrial fibrillation (HCC)    Atrial flutter (HCC)    typical appearing   Atrial tachycardia (HCC)    ablated 11/17/10  by JA  from the [facility] of the aorta   CHF (congestive heart failure) (HCC)    Chronic lower back pain    Chronic nausea    Endometrial cancer (HCC)    grade 1   Gallstone pancreatitis    Gastritis    GERD (gastroesophageal reflux disease)    History of hiatal hernia    HTN (hypertension)    Hyperlipemia    Hypothyroidism    Obesity    Osteoporosis    Toe ulcer (HCC)    left 3rd toe   Vitamin D deficiency        Home Medications Prior to Admission medications   Medication Sig Start Date End Date Taking? Authorizing Provider  acetaminophen (TYLENOL) 325 MG tablet Take 2 tablets (650 mg total) by mouth every 6 (six) hours. 05/02/23   Glade Lloyd, MD  carbidopa-levodopa (SINEMET IR) 25-100 MG tablet Take 1 tablet by mouth 3 (three) times daily. 8am/noon/4pm 04/04/23   Tat, Octaviano Batty, DO  haloperidol (HALDOL) 1 MG tablet Take 1 mg by mouth 2  (two) times daily.    [provider]  levothyroxine (SYNTHROID) 88 MCG tablet TAKE ONE TABLET BY MOUTH EVERY DAY BEFORE BREAKFAST 03/07/23   Philip Aspen, Limmie Patricia, MD  metoprolol tartrate (LOPRESSOR) 50 MG tablet TAKE ONE TABLET TWICE DAILY WITH A MEAL 04/05/23   Dyann Kief, PA-C  oxyCODONE (OXY IR/ROXICODONE) 5 MG immediate release tablet Take 1 tablet (5 mg total) by mouth every 6 (six) hours as needed for moderate pain (pain score 4-6). 05/02/23   Glade Lloyd, MD  polyethylene glycol (MIRALAX) 17 g packet Take 17 g by mouth daily as needed for moderate constipation. 05/02/23   Glade Lloyd, MD  QUEtiapine (SEROQUEL) 50 MG tablet Take 50 mg by mouth at bedtime.    [provider]  senna-docusate (SENOKOT-S) 8.6-50 MG tablet Take 1 tablet by mouth 2 (two) times daily. 05/02/23   Glade Lloyd, MD  vitamin B-12 (CYANOCOBALAMIN) 1000 MCG tablet Take 1,000 mcg by mouth daily.    [provider]  warfarin (COUMADIN) 5 MG tablet Take 1 tablet (5 mg total) by mouth at bedtime. Take 1 tablet Mon, Wed, and Fri. Take one-half tablet Sun, Tues, Thurs, and Sat. As  DIRECTED BY COUMADIN CLINIC 05/02/23   Glade Lloyd, MD      Allergies    Iodinated contrast media, Zosyn [piperacillin sod-tazobactam so], and Penicillins    Review of Systems   Review of Systems A 10 point review of systems was performed and is negative unless otherwise reported in HPI.  Physical Exam Updated Vital Signs BP (!) 136/107   Pulse 87   Temp 98.1 F (36.7 C) (Oral)   Resp (!) 23   SpO2 100%  Physical Exam General: Normal appearing elderly female, lying in bed.  HEENT: NCAT, PERRLA, Sclera anicteric, MMM, trachea midline.  Cardiology: RRR, no murmurs/rubs/gallops. BL radial and DP pulses equal bilaterally.  Resp: Normal respiratory rate and effort. CTAB, no wheezes, rhonchi, crackles.  Abd: Soft, non-tender, non-distended. No rebound tenderness or guarding.  GU:  Deferred. MSK: No peripheral edema or signs of trauma. Extremities without deformity or TTP.  Skin: warm, dry.  Back: No CVA tenderness Neuro: A&Ox2 (to self and time), CNs II-XII grossly intact. 5/5 strength all extremities.. Sensation grossly intact.   ED Results / Procedures / Treatments   Labs (all labs ordered are listed, but only abnormal results are displayed) Labs Reviewed  COMPREHENSIVE METABOLIC PANEL - Abnormal; Notable for the following components:      Result Value   Glucose, Bld 106 (*)    BUN 24 (*)    Albumin 3.1 (*)    All other components within normal limits  CBC WITH DIFFERENTIAL/PLATELET - Abnormal; Notable for the following components:   WBC 11.2 (*)    Neutro Abs 8.2 (*)    Abs Immature Granulocytes 0.09 (*)    All other components within normal limits  RESP PANEL BY RT-PCR (RSV, FLU A&B, COVID)  RVPGX2  MAGNESIUM  CBC WITH DIFFERENTIAL/PLATELET  URINALYSIS, W/ REFLEX TO CULTURE (INFECTION SUSPECTED)  CBG MONITORING, ED    EKG None  Radiology DG Chest Portable 1 View Result Date: 08/01/2023 CLINICAL DATA:  Altered mental status EXAM: PORTABLE CHEST 1 VIEW COMPARISON:  04/10/2022 FINDINGS: Large hiatal hernia. Cardiomediastinal contours are otherwise normal. There is a chronic fracture at the neck of the right humerus. No focal airspace consolidation or pulmonary edema. No pleural effusion. IMPRESSION: 1. No active disease. 2. Large hiatal hernia. 3. Chronic fracture of the proximal right humeral neck. Electronically Signed   By: Deatra Robinson M.D.   On: 08/01/2023 20:10   CT Head Wo Contrast Result Date: 08/01/2023 CLINICAL DATA:  Head trauma, minor (Age >= 65y) maybe trauma? AMS EXAM: CT HEAD WITHOUT CONTRAST TECHNIQUE: Contiguous axial images were obtained from the base of the skull through the vertex without intravenous contrast. RADIATION DOSE REDUCTION: This exam was performed according to the departmental dose-optimization program which includes  automated exposure control, adjustment of the mA and/or kV according to patient size and/or use of iterative reconstruction technique. COMPARISON:  CT head November 15, 24. FINDINGS: Brain: No evidence of acute infarction, hemorrhage, hydrocephalus, extra-axial collection or mass lesion/mass effect. Moderate patchy white matter hypodensities, nonspecific but compatible with chronic microvascular ischemic change. Vascular: No hyperdense vessel identified. Skull: No acute fracture. Sinuses/Orbits: Clear sinuses.  No acute orbital findings. Other: No mastoid effusions. IMPRESSION: 1. No evidence of acute intracranial abnormality. 2. Moderate chronic microvascular ischemic disease. Electronically Signed   By: Feliberto Harts M.D.   On: 08/01/2023 19:44    Procedures Procedures    Medications Ordered in ED Medications  metoprolol tartrate (LOPRESSOR) injection 2.5 mg (2.5 mg Intravenous Given 08/01/23  2112)  haloperidol (HALDOL) tablet 1 mg (1 mg Oral Given 08/01/23 1858)  lactated ringers bolus 500 mL (0 mLs Intravenous Stopped 08/01/23 2053)    ED Course/ Medical Decision Making/ A&P                          Medical Decision Making Amount and/or Complexity of Data Reviewed Labs: ordered. Radiology: ordered. Decision-making details documented in ED Course.  Risk Prescription drug management.    This patient presents to the ED for concern of aggressive behavior, refusing medications, this involves an extensive number of treatment options, and is a complaint that carries with it a high risk of complications and morbidity.  I considered the following differential and admission for this acute, potentially life threatening condition.   MDM:    Ddx of acute altered mental status or encephalopathy considered but not limited to: -Intracranial abnormalities such as ICH, hydrocephalus, head trauma - no reported head trauma. Does take warfarin. CTH neg for acute intracranial pathology. -Infection  such as UTI, PNA - afebrile but does have mild leukocytosis. CXR neg for PNA. Will obtain UA.  -Consider possible polypharmacy, though no recent changes to medications reported.  -Consider dementia w/ behavioral disturbance. Given 1 mg PO haldol for treatment while she is in ED. - No significant electrolyte abnormalities or hyper/hypoglycemia -Neg viral panel -Patient with Afib w/ RVR noted on EKG/telemetry, likely a result of noncompliance with her metoprolol at facility. She is given small dose of IV metop and rate is controlled.    Clinical Course as of 08/02/23 0001  Mon Aug 01, 2023  2008 CT Head Wo Contrast 1. No evidence of acute intracranial abnormality. 2. Moderate chronic microvascular ischemic disease.   [HN]  2014 DG Chest Portable 1 View 1. No active disease. 2. Large hiatal hernia. 3. Chronic fracture of the proximal right humeral neck.   [HN]  2202 Pulse Rate: 87 Improved after just 2.5 mg IV metoprolol [HN]    Clinical Course User Index [HN] Loetta Rough, MD    Labs: I Ordered, and personally interpreted labs.  The pertinent results include:  those listed above  Imaging Studies ordered: I ordered imaging studies including CTH I independently visualized and interpreted imaging. I agree with the radiologist interpretation  Additional history obtained from chart review  Cardiac Monitoring: The patient was maintained on a cardiac monitor.  I personally viewed and interpreted the cardiac monitored which showed an underlying rhythm of: Atrial fibrillation with RVR, then afib with controlled rate  Reevaluation: After the interventions noted above, I reevaluated the patient and found that they have :improved  Social Determinants of Health: Lives at facility  Disposition:  Patient is signed out to the oncoming ED physician Dr. Eudelia Bunch who is made aware of her history, presentation, exam, workup, and plan.    Co morbidities that complicate the patient  evaluation  Past Medical History:  Diagnosis Date   Anemia    history of   Anxiety    Arthritis    "left knee" (04/05/2017)   Atrial fibrillation (HCC)    Atrial flutter (HCC)    typical appearing   Atrial tachycardia (HCC)    ablated 11/17/10  by JA  from the [facility] of the aorta   CHF (congestive heart failure) (HCC)    Chronic lower back pain    Chronic nausea    Endometrial cancer (HCC)    grade 1   Gallstone pancreatitis  Gastritis    GERD (gastroesophageal reflux disease)    History of hiatal hernia    HTN (hypertension)    Hyperlipemia    Hypothyroidism    Obesity    Osteoporosis    Toe ulcer (HCC)    left 3rd toe   Vitamin D deficiency      Medicines Meds ordered this encounter  Medications   haloperidol (HALDOL) tablet 1 mg   lactated ringers bolus 500 mL   metoprolol tartrate (LOPRESSOR) injection 2.5 mg    I have reviewed the patients home medicines and have made adjustments as needed  Problem List / ED Course: Problem List Items Addressed This Visit   None Visit Diagnoses       Aggressive behavior    -  Primary     Atrial fibrillation with rapid ventricular response (HCC)       Relevant Medications   metoprolol tartrate (LOPRESSOR) injection 2.5 mg                   This note was created using dictation software, which may contain spelling or grammatical errors.    Loetta Rough, MD 08/02/23 Marlyne Beards

## 2023-08-01 NOTE — ED Triage Notes (Signed)
Pt BIB EMS from [facility] due to AMS, combative behavior, and refusing meds and food for the past few days. Pt c/o generalized pain all over body. Pt has a closed pressure wound on Left Heel. Ativan was given IM @ 1400 at facility. Pt is AAOx4 on assessment in ED. Pt is unable to ambulate.  IN Route BP 156/98 HR 136 Hx of A-fib CBG 125 SpO2 99% RA RR 30 ETCO2 25 Pt refused IV and EKG with EMS

## 2023-08-01 NOTE — ED Notes (Signed)
Tried to do an I&O but could not see because she wouldn't open her legs

## 2023-08-02 DIAGNOSIS — R456 Violent behavior: Secondary | ICD-10-CM | POA: Diagnosis not present

## 2023-08-02 LAB — URINALYSIS, W/ REFLEX TO CULTURE (INFECTION SUSPECTED)
Bilirubin Urine: NEGATIVE
Glucose, UA: NEGATIVE mg/dL
Ketones, ur: NEGATIVE mg/dL
Nitrite: NEGATIVE
Protein, ur: 100 mg/dL — AB
Specific Gravity, Urine: 1.015 (ref 1.005–1.030)
WBC, UA: >50 WBC/hpf (ref 0–5)
pH: 8 (ref 5.0–8.0)

## 2023-08-02 MED ORDER — CEPHALEXIN 500 MG PO CAPS: 500.0000 mg | ORAL_CAPSULE | Freq: Three times a day (TID) | ORAL | 0 refills | Status: AC

## 2023-08-02 MED ORDER — SODIUM CHLORIDE 0.9 % IV SOLN
1.0000 g | Freq: Once | INTRAVENOUS | Status: AC
  Administered 2023-08-02: 1 g via INTRAVENOUS
  Filled 2023-08-02: qty 10

## 2023-08-02 NOTE — ED Notes (Signed)
In and out catheter attempted x1 on pt, pt did not tolerate well. Pt unable to position legs wide enough for catheterization. External catheter placed in attempt to obtain sample for urinalysis.

## 2023-08-02 NOTE — ED Notes (Signed)
Pt pulling at vital sign equipment, place hand mitts on patient.

## 2023-08-02 NOTE — ED Notes (Signed)
In and out cath attempt x2 unsuccessful

## 2023-08-02 NOTE — ED Provider Notes (Signed)
I assumed care of this patient from previous provider.  Please see their note for further details of history, exam, and MDM.   Briefly patient is a 89 y.o. female who presented here for altered mental status from baseline.  Patient is more combative than usual.  Refusing to take meds.  Currently awaiting UA to rule out infection.  UA does reveal evidence of UTI.  Treated with IV Rocephin in the emergency department.  Will DC with Keflex.  The patient appears reasonably screened and/or stabilized for discharge and I doubt any other medical condition or other [facility] requiring further screening, evaluation, or treatment in the ED at this time. I have discussed the findings, Dx and Tx plan with the patient/family who expressed understanding and agree(s) with the plan. Discharge instructions discussed at length. The patient/family was given strict return precautions who verbalized understanding of the instructions. No further questions at time of discharge.  Disposition: Discharge  Condition: Good  ED Discharge Orders          Ordered    cephALEXin (KEFLEX) 500 MG capsule  3 times daily        08/02/23 0458             Follow Up: Garlan Fillers, MD [address] [place] [address] [phone]  Call  to schedule an appointment for close follow up         Quentin Shorey, Amadeo Garnet, MD 08/02/23 [phone]

## 2023-08-04 ENCOUNTER — Encounter: Payer: Medicare Other | Admitting: Internal Medicine

## 2023-08-04 LAB — URINE CULTURE: Culture: >=100000 — AB

## 2023-08-05 ENCOUNTER — Telehealth (HOSPITAL_BASED_OUTPATIENT_CLINIC_OR_DEPARTMENT_OTHER): Payer: Self-pay

## 2023-08-05 NOTE — Telephone Encounter (Signed)
Post ED Visit - Positive Culture Follow-up  Culture report reviewed by antimicrobial stewardship pharmacist: [facility] []  Enzo Bi, Pharm.D. []  Celedonio Miyamoto, Pharm.D., BCPS AQ-ID []  Garvin Fila, Pharm.D., BCPS []  Georgina Pillion, [address].D., BCPS []  [place], [place].D., BCPS, AAHIVP []  Estella Husk, Pharm.D., BCPS, AAHIVP []  Lysle Pearl, PharmD, BCPS []  Phillips Climes, PharmD, BCPS []  Agapito Games, PharmD, BCPS []  Verlan Friends, PharmD []  Mervyn Gay, PharmD, BCPS []  Vinnie Level, PharmD  [facility] [x]  Sharin Mons, PharmD []  Greer Pickerel, PharmD []  Adalberto Cole, PharmD []  Perlie Gold, Rph []  Lonell Face) Jean Rosenthal, PharmD []  Earl Many, PharmD []  Junita Push, PharmD []  Dorna Leitz, PharmD []  Terrilee Files, PharmD []  Lynann Beaver, PharmD []  Keturah Barre, PharmD []  Loralee Pacas, PharmD []  Bernadene Person, PharmD   Positive urine culture Treated with Cephalexin, organism sensitive to the same and no further patient follow-up is required at this time.  Sandria Senter 08/05/2023, 9:24 AM

## 2023-08-08 ENCOUNTER — Ambulatory Visit: Payer: Medicare Other | Admitting: Neurology

## 2023-08-29 ENCOUNTER — Telehealth: Payer: Self-pay | Admitting: *Deleted

## 2023-08-29 NOTE — Telephone Encounter (Signed)
 Received a voicemail from the pt's daughter stating to call her back regarding the pt. Spoke with daughter, Charlotte Castro, on [place], and she stated that the pt is in [facility] since 05/02/23 and she has not been taking any of her meds. She states she has been confused and does not think the meds they are trying to give her we ordered by her Doctors. She states she has now scheduled an appt with Rise Paganini next week and hope to address this issue. She was thankful for the call and will update [place] an needed.  [facility] is a facility that monitors the INR so if warfarin is resumed they will continue monitoring.

## 2023-09-02 ENCOUNTER — Ambulatory Visit: Payer: Medicare Other | Admitting: Orthopedic Surgery

## 2023-09-02 ENCOUNTER — Other Ambulatory Visit (INDEPENDENT_AMBULATORY_CARE_PROVIDER_SITE_OTHER): Payer: Self-pay

## 2023-09-02 ENCOUNTER — Encounter: Payer: Self-pay | Admitting: Orthopedic Surgery

## 2023-09-02 DIAGNOSIS — S42421A Displaced comminuted supracondylar fracture without intercondylar fracture of right humerus, initial encounter for closed fracture: Secondary | ICD-10-CM | POA: Diagnosis not present

## 2023-09-02 NOTE — Progress Notes (Signed)
 Post-Op Visit Note   Patient: Charlotte Castro           Date of Birth: October 03, 1932           MRN: [id number] Visit Date: 09/02/2023 PCP: Garlan Fillers, MD   Assessment & Plan:  Chief Complaint:  Chief Complaint  Patient presents with   Right Shoulder - Follow-up, Fracture   Visit Diagnoses:  1. Closed displaced comminuted supracondylar fracture of right humerus without intercondylar fracture, initial encounter     Plan: Jamita is a patient who is now about 4 months out right proximal humerus fracture.  Patient is not taking many of her medications by her choice.  She may be missing some essential medications according to her caregiver.  She does report some pain in the shoulder.  On examination the fracture does move as a unit.  Deltoid fires.  Shoulder is predictably stiff.  Radiographs do show some healing and callus formation.  Plan at this time is to allow her to weight-bear through that right shoulder with a walker.  Continue with therapy for range of motion exercises but it is highly unlikely at this time that she will get a very functional range of motion of the shoulder.  Patient did want to do nonoperative treatment.  We will see her back as needed.  Follow-Up Instructions: No follow-ups on file.   Orders:  Orders Placed This Encounter  Procedures   XR Shoulder Right   No orders of the defined types were placed in this encounter.   Imaging: No results found.  PMFS History: Patient Active Problem List   Diagnosis Date Noted   Closed displaced comminuted supracondylar fracture of right humerus without intercondylar fracture 05/17/2023   At risk for inadequate pain control 04/30/2023   Fall at home, initial encounter 04/29/2023   Humerus fracture 04/29/2023   Inadequate pain control 04/29/2023   Chronic atrial fibrillation with RVR (HCC) 04/29/2023   Arthritis, multiple joint involvement 04/11/2022   Radiculopathy 04/11/2022   Chest pain 01/13/2021    Orthostasis 01/13/2021   Abdominal pain    Acute cystitis without hematuria    Hiatal hernia 01/29/2019   AKI (acute kidney injury) (HCC) 01/14/2019   B12 deficiency 08/08/2018   Hypothyroidism 08/08/2018   Endometrial cancer (HCC) 08/05/2017   Toe ulcer, left, with unspecified severity (HCC) 08/04/2017   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) 08/04/2017   Postmenopausal bleeding 08/01/2017   Epigastric abdominal pain 04/05/2017   Delirium 04/14/2016   Tremor 04/14/2016   Chronic diastolic CHF (congestive heart failure) (HCC) 04/10/2016   Essential hypertension, benign 01/18/2014   Long term current use of anticoagulant therapy 01/18/2014   Permanent atrial fibrillation (HCC) 06/10/2011   Past Medical History:  Diagnosis Date   Anemia    history of   Anxiety    Arthritis    "left knee" (04/05/2017)   Atrial fibrillation (HCC)    Atrial flutter (HCC)    typical appearing   Atrial tachycardia (HCC)    ablated 11/17/10  by JA  from the [facility] of the aorta   CHF (congestive heart failure) (HCC)    Chronic lower back pain    Chronic nausea    Endometrial cancer (HCC)    grade 1   Gallstone pancreatitis    Gastritis    GERD (gastroesophageal reflux disease)    History of hiatal hernia    HTN (hypertension)    Hyperlipemia  Hypothyroidism    Obesity    Osteoporosis    Toe ulcer (HCC)    left 3rd toe   Vitamin D deficiency     Family History  Problem Relation Age of Onset   Hypertension Mother    Heart attack Father    Hypertension Father     Past Surgical History:  Procedure Laterality Date   ATRIAL ABLATION SURGERY  11/17/10   Atrial tachycardia arising from [facility] of the aorta ablated by [facility] SURGERY     CARDIAC CATHETERIZATION  01/11/2011   [name] 01/12/2011   CHOLECYSTECTOMY N/A 04/08/2017   Procedure: LAPAROSCOPIC CHOLECYSTECTOMY;  Surgeon: Violeta Gelinas, MD;  Location: [facility] OR;  Service: General;  Laterality: N/A;    FRACTURE SURGERY     HYSTEROSCOPY WITH D & C N/A 08/05/2017   Procedure: DILATATION AND CURETTAGE /HYSTEROSCOPY AND POLYPECTOMY;  Surgeon: Tereso Newcomer, MD;  Location: [facility] ORS;  Service: Gynecology;  Laterality: N/A;   LUMBAR SPINE SURGERY  ~ 1998   ROBOTIC ASSISTED TOTAL HYSTERECTOMY WITH BILATERAL SALPINGO OOPHERECTOMY Bilateral 09/20/2017   Procedure: XI ROBOTIC ASSISTED TOTAL HYSTERECTOMY WITH BILATERAL SALPINGO OOPHORECTOMY, SENTINAL LYMPH NODE BIOPSY;  Surgeon: Adolphus Birchwood, MD;  Location: [facility] ORS;  Service: Gynecology;  Laterality: Bilateral;   SHOULDER SURGERY Right 1984 X2   /ntoes 01/19/2011   WRIST FRACTURE SURGERY Left 1983   Social History   Occupational History   Not on file  Tobacco Use   Smoking status: Never   Smokeless tobacco: Never  Vaping Use   Vaping status: Never Used  Substance and Sexual Activity   Alcohol use: No   Drug use: No   Sexual activity: Never    Birth control/protection: Post-menopausal

## 2023-09-08 ENCOUNTER — Ambulatory Visit: Attending: Emergency Medicine | Admitting: Emergency Medicine

## 2023-09-08 ENCOUNTER — Encounter: Payer: Self-pay | Admitting: Emergency Medicine

## 2023-09-08 VITALS — BP 132/76 | HR 108 | Resp 18

## 2023-09-08 DIAGNOSIS — R0609 Other forms of dyspnea: Secondary | ICD-10-CM | POA: Insufficient documentation

## 2023-09-08 DIAGNOSIS — I10 Essential (primary) hypertension: Secondary | ICD-10-CM | POA: Diagnosis present

## 2023-09-08 DIAGNOSIS — G20A1 Parkinson's disease without dyskinesia, without mention of fluctuations: Secondary | ICD-10-CM | POA: Diagnosis present

## 2023-09-08 DIAGNOSIS — I4821 Permanent atrial fibrillation: Secondary | ICD-10-CM | POA: Insufficient documentation

## 2023-09-08 NOTE — Progress Notes (Signed)
 Cardiology Office Note:    Date:  09/08/2023  ID:  Charlotte Castro, DOB 07/27/1932, MRN [id number] PCP: Garlan Fillers, MD  [facility] Providers Cardiologist:  Thomasene Ripple, DO       Patient Profile:      Chief Complaint: Follow-up atrial fibrillation  History of Present Illness:  Charlotte Castro is a 89 y.o. female with visit-pertinent history of chest pain (normal cors by cath in 2012 and low-risk NST in 05/2017), paroxysmal atrial fibrillation/atrial tachycardia, HTN, HLD, hypothyroidism and history of endometrial cancer, Parkinson's disease  Since 2012 patient has had several different types of tachyarrhythmia and bradycardia which have been difficult to manage.  In the past they have tried to evaluate her heart rhythm at home to see if she had any significant pauses or heart rates below 40 however she did not want a wear any type of heart monitor.  She was noted to have gray coating over her eyelid and her amiodarone was discontinued.  The patient was being seen by neurology and had been started on Parkinson's medication.  This apparently has been stopped due to the patient to screen with diagnosis of Parkinson's.    She was seen in clinic on 06/2021 and reported worsening shoulder pain in the setting of arthritis but denied any recent chest pain. She did report chronic fatigue and dyspnea which was overall unchanged. Given her history of tachycardia, an echocardiogram was recommended to rule out a cardiomyopathy. This showed a preserved EF of 60 to 65% with no regional wall motion abnormalities. She did have mild LVH, normal RV function, normal PASP, mild MR and mild AI with no significant valve abnormalities.   She presented to the ED on 03/2022 with complaints of chest pain.  Her chest pain is atypical.  Her troponins were normal.  EKG showed peaked T waves but no acute changes.  She was initially in atrial flutter but did convert back to NSR on IV Cardizem.  Her Lopressor was  increased to 50 mg twice daily.  Her chest pain is atypical, with some relief from Protonix.  It was determined there is no indication for further ischemic evaluation at that time.  Echocardiogram 04/12/2022 showed LVEF 60 to 65%, no RWMA, mild LVH, RV function and size normal, normal PASP, left atrial size mildly dilated, mild to moderate mitral valve regurgitation, moderate mitral annular calcification, moderate calcification of the aortic valve, trivial aortic valve regurgitation.  She was discharged home in stable condition.  She was last seen in clinic on 02/07/2023.  She was in rate controlled flutter.  Her metoprolol was continued.  Her heart rates were stable.  Shortness of breath and fatigue.  No medication changes are made and she was to follow-up in 1 year.  She was admitted to the hospital on 04/29/2023 through 05/02/2023 for follow-up.  She presented with a fall and was found to have commuted fracture of right proximal humerus.  She was also noted to be in A-fib with RVR on presentation.  She was discharged with her rate controlled.  She was continued on Coumadin and metoprolol.  She was discharged to [facility].   Was recently seen in the ED on 08/01/2023 for aggressive behavior and altered mental status.  Per chart review patient presented with increased agitation, throwing items, threats made to other residents as well as herself.  She was also refusing medication and food for several days.  She was found to be in atrial flutter with rapid ventricular  response with ventricular rate of 132.  CT head negative for acute intracranial pathology.  Chest x-ray was negative for pneumonia.  Atrial flutter with RVR was likely result of noncompliance with her metoprolol at the facility.  She was given small dose IV metoprolol and rate was better controlled at 87.  She was given Haldol 1 mg.  Urinalysis was positive for urinary tract infection and she was given 1 g IV Rocephin.  She was discharged back to her  facility.   Discussed the use of AI scribe software for clinical note transcription with the patient, who gave verbal consent to proceed.  Patient arrives to clinic today accompanied by her family friend.  The patient's daughter was present via telephone. The patient is currently living at [facility].  She presented today in a wheelchair.  She expresses distrust in the medications provided by the nursing home and has stopped taking all of her medications including her Coumadin and metoprolol around 1 month ago.  She reports feeling "drugged" after taking pills from the staff.  Patient did initially refuse EKG in office today.  She did deny any chest pains, orthopnea, PND, syncope, presyncope.  She noted her chronic leg swelling have remained stable and her dyspnea on exertion has not worsened.    Review of systems:  Please see the history of present illness. All other systems are reviewed and otherwise negative.     Home Medications:    Current Meds  Medication Sig   acetaminophen (TYLENOL) 325 MG tablet Take 2 tablets (650 mg total) by mouth every 6 (six) hours.   carbidopa-levodopa (SINEMET IR) 25-100 MG tablet Take 1 tablet by mouth 3 (three) times daily. 8am/noon/4pm   haloperidol (HALDOL) 1 MG tablet Take 1 mg by mouth 2 (two) times daily.   levothyroxine (SYNTHROID) 88 MCG tablet TAKE ONE TABLET BY MOUTH EVERY DAY BEFORE BREAKFAST   metoprolol tartrate (LOPRESSOR) 50 MG tablet TAKE ONE TABLET TWICE DAILY WITH A MEAL   oxyCODONE (OXY IR/ROXICODONE) 5 MG immediate release tablet Take 1 tablet (5 mg total) by mouth every 6 (six) hours as needed for moderate pain (pain score 4-6).   polyethylene glycol (MIRALAX) 17 g packet Take 17 g by mouth daily as needed for moderate constipation.   QUEtiapine (SEROQUEL) 50 MG tablet Take 50 mg by mouth at bedtime.   senna-docusate (SENOKOT-S) 8.6-50 MG tablet Take 1 tablet by mouth 2 (two) times daily.   vitamin B-12 (CYANOCOBALAMIN) 1000  MCG tablet Take 1,000 mcg by mouth daily.   warfarin (COUMADIN) 5 MG tablet Take 1 tablet (5 mg total) by mouth at bedtime. Take 1 tablet Mon, Wed, and Fri. Take one-half tablet Sun, Tues, Thurs, and Sat. As DIRECTED BY COUMADIN CLINIC   Studies Reviewed:       Echocardiogram 04/12/2022 1. Left ventricular ejection fraction, by estimation, is 60 to 65%. The  left ventricle has normal function. The left ventricle has no regional  wall motion abnormalities. There is mild left ventricular hypertrophy.  Left ventricular diastolic parameters  are indeterminate.   2. Right ventricular systolic function is normal. The right ventricular  size is normal. There is normal pulmonary artery systolic pressure. The  estimated right ventricular systolic pressure is 32.8 mmHg.   3. Left atrial size was moderately dilated.   4. The mitral valve is degenerative. Mild to moderate mitral valve  regurgitation, seen well in only one view due to image quality. No  evidence of mitral stenosis. Moderate mitral  annular calcification.   5. The aortic valve is abnormal. There is moderate calcification of the  aortic valve. Aortic valve regurgitation is trivial. No aortic stenosis is  present.   6. The inferior vena cava is normal in size with greater than 50%  respiratory variability, suggesting right atrial pressure of 3 mmHg.  Risk Assessment/Calculations:    CHA2DS2-VASc Score = 4   This indicates a 4.8% annual risk of stroke. The patient's score is based upon: CHF History: 0 HTN History: 1 Diabetes History: 0 Stroke History: 0 Vascular Disease History: 0 Age Score: 2 Gender Score: 1            Physical Exam:   VS:  BP 132/76 (BP Location: Left Arm, Patient Position: Sitting, Cuff Size: Normal)   Pulse (!) 108   Resp 18   SpO2 96%    Wt Readings from Last 3 Encounters:  05/02/23 191 lb 9.3 oz (86.9 kg)  04/04/23 187 lb 9.6 oz (85.1 kg)  03/02/23 185 lb (83.9 kg)    GEN: Well nourished, well  developed in no acute distress NECK: No JVD; No carotid bruits CARDIAC: Irregular irregular rhythm, no murmurs, rubs, gallops RESPIRATORY:  Clear to auscultation without rales, wheezing or rhonchi  ABDOMEN: Soft, non-tender, non-distended EXTREMITIES:  No edema; No acute deformity     Assessment and Plan:  Permanent atrial fibrillation/flutter 2 recent hospital admissions in the setting of Afib w/ RVR  She had repeatedly declined Metoprolol and Coumadin at her SNF ([facility]), these were ultimately discontinued approximately 1 month ago by SNF provider - Today patient declined EKG in office.  Auscultation showed ongoing atrial fibrillation.  Her heart rate today was 108 bpm - There was a long discussion today with patient, daughter, and family friend regarding patient's wishes to no longer be on medication for atrial fibrillation   - Reviewed in detail risk of stroke and heart failure without rate controlling medications or anticoagulation therapy   - Patient stated that she understood her risk and was okay with continuing without medication management - I have told patient and family that if she does wish to begin anticoagulation and rate control therapy once more to please reach out to our office - She stated that she preferred quality of life over and medical therapy. Patient and family verbalized understanding of risk as discussed   Hypertension Blood pressure today is 132/76 and under adequate control without antihypertensive medication  Chronic dyspnea She reports her dyspnea is stable.  No current exacerbation  Parkinson's disease  Managed by neurology           Dispo:  Return in about 3 months (around 12/09/2023).  Signed, Denyce Robert, NP

## 2023-09-08 NOTE — Patient Instructions (Signed)
 Medication Instructions:  Your physician recommends that you continue on your current medications as directed. Please refer to the Current Medication list given to you today.  *If you need a refill on your cardiac medications before your next appointment, please call your pharmacy*   Follow-Up: At [facility], you and your health needs are our priority.  As part of our continuing mission to provide you with exceptional heart care, we have created designated Provider Care Teams.  These Care Teams include your primary Cardiologist (physician) and Advanced Practice Providers (APPs -  Physician Assistants and Nurse Practitioners) who all work together to provide you with the care you need, when you need it.  We recommend signing up for the patient portal called "MyChart".  Sign up information is provided on this After Visit Summary.  MyChart is used to connect with patients for Virtual Visits (Telemedicine).  Patients are able to view lab/test results, encounter notes, upcoming appointments, etc.  Non-urgent messages can be sent to your provider as well.   To learn more about what you can do with MyChart, go to [email or web address].    Your next appointment:   3 month(s)  Provider:   Dr. Servando Salina  Other Instructions

## 2023-10-03 ENCOUNTER — Ambulatory Visit: Admitting: Orthopedic Surgery

## 2023-10-17 ENCOUNTER — Ambulatory Visit: Admitting: Orthopedic Surgery

## 2023-10-28 ENCOUNTER — Ambulatory Visit: Admitting: Orthopedic Surgery

## 2023-10-28 DIAGNOSIS — S42291D Other displaced fracture of upper end of right humerus, subsequent encounter for fracture with routine healing: Secondary | ICD-10-CM

## 2023-10-28 DIAGNOSIS — S42421A Displaced comminuted supracondylar fracture without intercondylar fracture of right humerus, initial encounter for closed fracture: Secondary | ICD-10-CM | POA: Diagnosis not present

## 2023-10-29 ENCOUNTER — Encounter: Payer: Self-pay | Admitting: Orthopedic Surgery

## 2023-10-29 NOTE — Progress Notes (Signed)
 Office Visit Note   Patient: Charlotte Castro           Date of Birth: 05-24-1933           MRN: [id number] Visit Date: 10/28/2023 Requested by: Bertha Broad, MD [address] [place],  [place] [address] PCP: Bertha Broad, MD  Subjective: Chief Complaint  Patient presents with   Right Shoulder - Follow-up    HPI: Charlotte Castro is a 89 y.o. female who presents to the office reporting continued right shoulder pain.  Patient has had somewhat of a medical decline since she was last seen.  She is not getting up much.  She is being treated for heel ulcer.  Trying to do some exercises.  She is pretty much limited to the wheelchair..                ROS: All systems reviewed are negative as they relate to the chief complaint within the history of present illness.  Patient denies fevers or chills.  Assessment & Plan: Visit Diagnoses:  1. Closed displaced comminuted supracondylar fracture of right humerus without intercondylar fracture, initial encounter     Plan: Impression is healed right proximal humerus fracture.  Shoulder is stiff but less painful than it was.  I think it is okay for her to continue with therapy for shoulder passive range of motion.  She will follow-up with us  as needed.  Follow-Up Instructions: No follow-ups on file.   Orders:  No orders of the defined types were placed in this encounter.  No orders of the defined types were placed in this encounter.     Procedures: No procedures performed   Clinical Data: No additional findings.  Objective: Vital Signs: There were no vitals taken for this visit.  Physical Exam:  Constitutional: Patient appears well-developed HEENT:  Head: Normocephalic Eyes:EOM are normal Neck: Normal range of motion Cardiovascular: Normal rate Pulmonary/chest: Effort normal Neurologic: Patient is alert Skin: Skin is warm Psychiatric: Patient has normal mood and affect  Ortho Exam: Ortho exam demonstrates with  rotation of the fracture moves as 1 unit.  Motor or sensory function of the hand is intact.  Radial pulses intact.  No masses lymphadenopathy or skin changes noted in the right proximal humeral region.  Specialty Comments:  No specialty comments available.  Imaging: No results found.   PMFS History: Patient Active Problem List   Diagnosis Date Noted   Closed displaced comminuted supracondylar fracture of right humerus without intercondylar fracture 05/17/2023   At risk for inadequate pain control 04/30/2023   Fall at home, initial encounter 04/29/2023   Humerus fracture 04/29/2023   Inadequate pain control 04/29/2023   Chronic atrial fibrillation with RVR (HCC) 04/29/2023   Arthritis, multiple joint involvement 04/11/2022   Radiculopathy 04/11/2022   Chest pain 01/13/2021   Orthostasis 01/13/2021   Abdominal pain    Acute cystitis without hematuria    Hiatal hernia 01/29/2019   AKI (acute kidney injury) (HCC) 01/14/2019   B12 deficiency 08/08/2018   Hypothyroidism 08/08/2018   Endometrial cancer (HCC) 08/05/2017   Toe ulcer, left, with unspecified severity (HCC) 08/04/2017   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) 08/04/2017   Postmenopausal bleeding 08/01/2017   Epigastric abdominal pain 04/05/2017   Delirium 04/14/2016   Tremor 04/14/2016   Chronic diastolic CHF (congestive heart failure) (HCC) 04/10/2016   Essential hypertension, benign 01/18/2014   Long term current use of anticoagulant therapy 01/18/2014  Permanent atrial fibrillation (HCC) 06/10/2011   Past Medical History:  Diagnosis Date   Anemia    history of   Anxiety    Arthritis    "left knee" (04/05/2017)   Atrial fibrillation (HCC)    Atrial flutter (HCC)    typical appearing   Atrial tachycardia (HCC)    ablated 11/17/10  by JA  from [facility] of the aorta   CHF (congestive heart failure) (HCC)    Chronic lower back pain    Chronic nausea    Endometrial cancer (HCC)     grade 1   Gallstone pancreatitis    Gastritis    GERD (gastroesophageal reflux disease)    History of hiatal hernia    HTN (hypertension)    Hyperlipemia    Hypothyroidism    Obesity    Osteoporosis    Toe ulcer (HCC)    left 3rd toe   Vitamin D  deficiency     Family History  Problem Relation Age of Onset   Hypertension Mother    Heart attack Father    Hypertension Father     Past Surgical History:  Procedure Laterality Date   ATRIAL ABLATION SURGERY  11/17/10   Atrial tachycardia arising from [facility] of the aorta ablated by [facility] SURGERY     CARDIAC CATHETERIZATION  01/11/2011   Maximo Spar 01/12/2011   CHOLECYSTECTOMY N/A 04/08/2017   Procedure: LAPAROSCOPIC CHOLECYSTECTOMY;  Surgeon: Dorena Gander, MD;  Location: [facility] OR;  Service: General;  Laterality: N/A;   FRACTURE SURGERY     HYSTEROSCOPY WITH D & C N/A 08/05/2017   Procedure: DILATATION AND CURETTAGE /HYSTEROSCOPY AND POLYPECTOMY;  Surgeon: Julianne Octave, MD;  Location: [facility] ORS;  Service: Gynecology;  Laterality: N/A;   LUMBAR SPINE SURGERY  ~ 1998   ROBOTIC ASSISTED TOTAL HYSTERECTOMY WITH BILATERAL SALPINGO OOPHERECTOMY Bilateral 09/20/2017   Procedure: XI ROBOTIC ASSISTED TOTAL HYSTERECTOMY WITH BILATERAL SALPINGO OOPHORECTOMY, SENTINAL LYMPH NODE BIOPSY;  Surgeon: Alphonso Aschoff, MD;  Location: [facility] ORS;  Service: Gynecology;  Laterality: Bilateral;   SHOULDER SURGERY Right 1984 X2   /ntoes 01/19/2011   WRIST FRACTURE SURGERY Left 1983   Social History   Occupational History   Not on file  Tobacco Use   Smoking status: Never   Smokeless tobacco: Never  Vaping Use   Vaping status: Never Used  Substance and Sexual Activity   Alcohol use: No   Drug use: No   Sexual activity: Never    Birth control/protection: Post-menopausal

## 2023-11-29 ENCOUNTER — Ambulatory Visit: Admitting: Cardiology

## 2023-12-23 ENCOUNTER — Ambulatory Visit: Admitting: Cardiology

## 2024-02-13 DEATH — deceased

## 2024-04-16 ENCOUNTER — Encounter: Payer: Self-pay | Admitting: Radiology
